# Patient Record
Sex: Female | Born: 1945 | Race: White | Hispanic: Yes | Marital: Married | Smoking: Never smoker
Health system: Southern US, Community
[De-identification: ages and names within clinical notes are randomized; demographics above are authoritative.]

## PROBLEM LIST (undated history)

## (undated) ENCOUNTER — Ambulatory Visit

## (undated) DIAGNOSIS — R0789 Other chest pain: Secondary | ICD-10-CM

## (undated) DIAGNOSIS — I8390 Asymptomatic varicose veins of unspecified lower extremity: Secondary | ICD-10-CM

## (undated) DIAGNOSIS — K7689 Other specified diseases of liver: Secondary | ICD-10-CM

## (undated) DIAGNOSIS — K449 Diaphragmatic hernia without obstruction or gangrene: Secondary | ICD-10-CM

## (undated) DIAGNOSIS — I493 Ventricular premature depolarization: Secondary | ICD-10-CM

## (undated) DIAGNOSIS — I340 Nonrheumatic mitral (valve) insufficiency: Secondary | ICD-10-CM

## (undated) DIAGNOSIS — J309 Allergic rhinitis, unspecified: Secondary | ICD-10-CM

## (undated) DIAGNOSIS — M1990 Unspecified osteoarthritis, unspecified site: Secondary | ICD-10-CM

## (undated) DIAGNOSIS — E785 Hyperlipidemia, unspecified: Secondary | ICD-10-CM

## (undated) DIAGNOSIS — K2970 Gastritis, unspecified, without bleeding: Secondary | ICD-10-CM

## (undated) DIAGNOSIS — I10 Essential (primary) hypertension: Secondary | ICD-10-CM

## (undated) DIAGNOSIS — K222 Esophageal obstruction: Secondary | ICD-10-CM

## (undated) DIAGNOSIS — K5790 Diverticulosis of intestine, part unspecified, without perforation or abscess without bleeding: Secondary | ICD-10-CM

## (undated) DIAGNOSIS — Z87442 Personal history of urinary calculi: Secondary | ICD-10-CM

## (undated) DIAGNOSIS — B029 Zoster without complications: Secondary | ICD-10-CM

## (undated) DIAGNOSIS — I499 Cardiac arrhythmia, unspecified: Secondary | ICD-10-CM

## (undated) DIAGNOSIS — K589 Irritable bowel syndrome without diarrhea: Secondary | ICD-10-CM

## (undated) DIAGNOSIS — M810 Age-related osteoporosis without current pathological fracture: Secondary | ICD-10-CM

## (undated) DIAGNOSIS — K219 Gastro-esophageal reflux disease without esophagitis: Secondary | ICD-10-CM

## (undated) HISTORY — DX: Other specified diseases of liver: K76.89

## (undated) HISTORY — DX: Gastro-esophageal reflux disease without esophagitis: K21.9

## (undated) HISTORY — DX: Unspecified osteoarthritis, unspecified site: M19.90

## (undated) HISTORY — PX: CATARACT EXTRACTION, BILATERAL: SHX1313

## (undated) HISTORY — DX: Hyperlipidemia, unspecified: E78.5

## (undated) HISTORY — DX: Diverticulosis of intestine, part unspecified, without perforation or abscess without bleeding: K57.90

## (undated) HISTORY — DX: Asymptomatic varicose veins of unspecified lower extremity: I83.90

## (undated) HISTORY — DX: Irritable bowel syndrome, unspecified: K58.9

## (undated) HISTORY — DX: Other chest pain: R07.89

## (undated) HISTORY — DX: Nonrheumatic mitral (valve) insufficiency: I34.0

## (undated) HISTORY — DX: Allergic rhinitis, unspecified: J30.9

## (undated) HISTORY — DX: Age-related osteoporosis without current pathological fracture: M81.0

## (undated) HISTORY — DX: Ventricular premature depolarization: I49.3

## (undated) HISTORY — DX: Esophageal obstruction: K22.2

## (undated) HISTORY — DX: Gastritis, unspecified, without bleeding: K29.70

## (undated) HISTORY — PX: CARDIAC CATHETERIZATION: SHX172

## (undated) HISTORY — PX: COLONOSCOPY: SHX174

## (undated) HISTORY — DX: Personal history of urinary calculi: Z87.442

## (undated) HISTORY — DX: Zoster without complications: B02.9

## (undated) HISTORY — PX: ESOPHAGOGASTRODUODENOSCOPY: SHX1529

## (undated) HISTORY — DX: Diaphragmatic hernia without obstruction or gangrene: K44.9

---
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
## 1990-11-24 HISTORY — PX: OTHER SURGICAL HISTORY: SHX169

## 2006-11-24 HISTORY — PX: CYSTOCELE REPAIR: SHX163

## 2007-11-08 DIAGNOSIS — K219 Gastro-esophageal reflux disease without esophagitis: Secondary | ICD-10-CM

## 2007-11-08 HISTORY — DX: Gastro-esophageal reflux disease without esophagitis: K21.9

## 2007-11-08 LAB — HM COLONOSCOPY

## 2008-10-02 ENCOUNTER — Encounter: Payer: Self-pay | Admitting: Internal Medicine

## 2009-06-19 ENCOUNTER — Encounter: Payer: Self-pay | Admitting: Internal Medicine

## 2009-07-04 ENCOUNTER — Encounter: Payer: Self-pay | Admitting: Internal Medicine

## 2009-07-05 ENCOUNTER — Encounter: Payer: Self-pay | Admitting: Internal Medicine

## 2009-10-04 ENCOUNTER — Encounter: Payer: Self-pay | Admitting: Internal Medicine

## 2010-06-14 ENCOUNTER — Encounter: Payer: Self-pay | Admitting: Internal Medicine

## 2010-06-14 LAB — CONVERTED CEMR LAB
Albumin: 4.1 g/dL
Alkaline Phosphatase: 58 units/L
BUN: 15 mg/dL
Chloride: 101 meq/L
Cholesterol: 195 mg/dL
Creatinine, Ser: 0.73 mg/dL
Eosinophils Relative: 3 %
HCT: 44 %
HDL: 46 mg/dL
Neutrophils Relative %: 60 %
Platelets: 246 10*3/uL
RBC: 4.46 M/uL
RDW: 13.8 %
Sodium: 141 meq/L
Total Bilirubin: 0.4 mg/dL
Total Protein: 6.6 g/dL

## 2010-09-27 ENCOUNTER — Ambulatory Visit: Payer: Self-pay | Admitting: Internal Medicine

## 2010-09-27 ENCOUNTER — Telehealth: Payer: Self-pay | Admitting: Internal Medicine

## 2010-09-27 ENCOUNTER — Encounter: Payer: Self-pay | Admitting: Gastroenterology

## 2010-09-27 DIAGNOSIS — R519 Headache, unspecified: Secondary | ICD-10-CM | POA: Insufficient documentation

## 2010-09-27 DIAGNOSIS — I10 Essential (primary) hypertension: Secondary | ICD-10-CM | POA: Insufficient documentation

## 2010-09-27 DIAGNOSIS — G43909 Migraine, unspecified, not intractable, without status migrainosus: Secondary | ICD-10-CM | POA: Insufficient documentation

## 2010-09-27 DIAGNOSIS — Z87442 Personal history of urinary calculi: Secondary | ICD-10-CM | POA: Insufficient documentation

## 2010-09-27 DIAGNOSIS — R51 Headache: Secondary | ICD-10-CM | POA: Insufficient documentation

## 2010-09-27 DIAGNOSIS — M1990 Unspecified osteoarthritis, unspecified site: Secondary | ICD-10-CM | POA: Insufficient documentation

## 2010-09-27 DIAGNOSIS — K219 Gastro-esophageal reflux disease without esophagitis: Secondary | ICD-10-CM | POA: Insufficient documentation

## 2010-09-27 DIAGNOSIS — M810 Age-related osteoporosis without current pathological fracture: Secondary | ICD-10-CM

## 2010-09-27 DIAGNOSIS — M816 Localized osteoporosis [Lequesne]: Secondary | ICD-10-CM | POA: Insufficient documentation

## 2010-09-27 HISTORY — DX: Migraine, unspecified, not intractable, without status migrainosus: G43.909

## 2010-09-27 HISTORY — DX: Personal history of urinary calculi: Z87.442

## 2010-09-27 HISTORY — DX: Age-related osteoporosis without current pathological fracture: M81.0

## 2010-09-27 HISTORY — DX: Essential (primary) hypertension: I10

## 2010-11-01 ENCOUNTER — Ambulatory Visit: Payer: Self-pay | Admitting: Gastroenterology

## 2010-11-01 ENCOUNTER — Encounter (INDEPENDENT_AMBULATORY_CARE_PROVIDER_SITE_OTHER): Payer: Self-pay | Admitting: *Deleted

## 2010-11-01 DIAGNOSIS — R1032 Left lower quadrant pain: Secondary | ICD-10-CM | POA: Insufficient documentation

## 2010-11-01 DIAGNOSIS — R1319 Other dysphagia: Secondary | ICD-10-CM | POA: Insufficient documentation

## 2010-11-01 DIAGNOSIS — R1031 Right lower quadrant pain: Secondary | ICD-10-CM | POA: Insufficient documentation

## 2010-11-01 LAB — CONVERTED CEMR LAB
ALT: 21 units/L (ref 0–35)
BUN: 16 mg/dL (ref 6–23)
Basophils Relative: 0.5 % (ref 0.0–3.0)
Calcium: 9.5 mg/dL (ref 8.4–10.5)
Creatinine, Ser: 0.7 mg/dL (ref 0.4–1.2)
Eosinophils Absolute: 0.2 10*3/uL (ref 0.0–0.7)
Ferritin: 52.8 ng/mL (ref 10.0–291.0)
Folate: >20 ng/mL
GFR calc non Af Amer: 86.29 mL/min (ref >60.00)
Hemoglobin: 14.4 g/dL (ref 12.0–15.0)
Iron: 90 ug/dL (ref 42–145)
Lymphs Abs: 2.1 10*3/uL (ref 0.7–4.0)
MCHC: 35.1 g/dL (ref 30.0–36.0)
MCV: 95.9 fL (ref 78.0–100.0)
Monocytes Absolute: 0.6 10*3/uL (ref 0.1–1.0)
Neutro Abs: 4.4 10*3/uL (ref 1.4–7.7)
RBC: 4.27 M/uL (ref 3.87–5.11)
RDW: 13.5 % (ref 11.5–14.6)
TSH: 0.82 microintl units/mL (ref 0.35–5.50)
Total Bilirubin: 0.5 mg/dL (ref 0.3–1.2)
Vitamin B-12: 1371 pg/mL — ABNORMAL HIGH (ref 211–911)

## 2010-11-11 ENCOUNTER — Ambulatory Visit: Payer: Self-pay | Admitting: Gastroenterology

## 2010-11-11 DIAGNOSIS — K222 Esophageal obstruction: Secondary | ICD-10-CM

## 2010-11-11 HISTORY — DX: Esophageal obstruction: K22.2

## 2010-11-11 LAB — CONVERTED CEMR LAB: UREASE: NEGATIVE

## 2010-11-29 ENCOUNTER — Telehealth: Payer: Self-pay | Admitting: Gastroenterology

## 2010-12-24 NOTE — Letter (Signed)
Summary: EGD Instructions  [facility]  [address] [place], [place] [address]   Phone: [phone]  Fax: [phone]       [name] [name]    06/30/1946    MRN: [id number]       Procedure Day /Date: Monday 11/11/2010     Arrival Time: 8am     Procedure Time: 9am     Location of Procedure:                    [facility] (4th Floor)    PREPARATION FOR ENDOSCOPY   On 11/11/2010 THE DAY OF THE PROCEDURE:  1.   No solid foods, milk or milk products are allowed after midnight the night before your procedure.  2.   Do not drink anything colored red or purple.  Avoid juices with pulp.  No orange juice.  3.  You may drink clear liquids until 7:00am, which is 2 hours before your procedure.                                                                                                CLEAR LIQUIDS INCLUDE: Water Jello Ice Popsicles Tea (sugar ok, no milk/cream) Powdered fruit flavored drinks Coffee (sugar ok, no milk/cream) Gatorade Juice: apple, white grape, white cranberry  Lemonade Clear bullion, consomm, broth Carbonated beverages (any kind) Strained chicken noodle soup Hard Candy   MEDICATION INSTRUCTIONS  Unless otherwise instructed, you should take regular prescription medications with a small sip of water as early as possible the morning of your procedure.                OTHER INSTRUCTIONS  You will need a responsible adult at least 66 years of age to accompany you and drive you home.   This person must remain in the waiting room during your procedure.  Wear loose fitting clothing that is easily removed.  Leave jewelry and other valuables at home.  However, you may wish to bring a book to read or an iPod/MP3 player to listen to music as you wait for your procedure to start.  Remove all body piercing jewelry and leave at home.  Total time from sign-in until discharge is approximately 2-3 hours.  You should go home  directly after your procedure and rest.  You can resume normal activities the day after your procedure.  The day of your procedure you should not:   Drive   Make legal decisions   Operate machinery   Drink alcohol   Return to work  You will receive specific instructions about eating, activities and medications before you leave.    The above instructions have been reviewed and explained to me by   _______________________    I fully understand and can verbalize these instructions _____________________________ Date _________

## 2010-12-24 NOTE — Progress Notes (Signed)
Summary: RX REQUEST  Phone Note Call from Patient Call back at Home Phone [phone]   Caller: Patient Call For: Newt Lukes MD Summary of Call: Pt is requesting a rx for METOPROLOL TARTRATE 25MG . Please advise.  Initial call taken by: Livingston Diones,  September 27, 2010 10:36 AM  Follow-up for Phone Call        [facility] [address] Pt is aware Follow-up by: Orlan Leavens RMA,  September 27, 2010 10:45 AM    Prescriptions: METOPROLOL TARTRATE 25 MG TABS (METOPROLOL TARTRATE) take 1 two times a day  #60 x 3   Entered by:   Orlan Leavens RMA   Authorized by:   Newt Lukes MD   Signed by:   Orlan Leavens RMA on 09/27/2010   Method used:   Electronically to        [id number]       [address]       [place], [place]  [address]       Ph: [phone] or [phone]       Fax: [phone]   RxID:   [id number]

## 2010-12-24 NOTE — Assessment & Plan Note (Signed)
Summary: New / Medicare / # cd   Vital Signs:  Patient profile:   65 year old female Height:      62 inches (157.48 cm) Weight:      147.12 pounds (66.87 kg) BMI:     27.01 O2 Sat:      96 % on Room air Temp:     98.6 degrees F (37.00 degrees C) oral Pulse rate:   76 / minute BP sitting:   152 / 82  (left arm) Cuff size:   regular  Vitals Entered By: Orlan Leavens RMA (September 27, 2010 9:48 AM)  O2 Flow:  Room air CC: New patient Is Patient Diabetic? No Pain Assessment Patient in pain? no        Primary Care Provider:  Newt Lukes MD  CC:  New patient.  History of Present Illness: new pt to me and our practice, here to est care has brought records from prior PCP in [place] to review  c/o epigastric pain present for 3+ years but worse in past 4 weeks feels like prior GERD symptoms  prior EGD for same 2008 (per pt, no report available) - rx'd nexium, no ulcers -- improved symptoms  now on omeprazol due to cost x 6 months reports compliance with ongoing medical treatment and no changes in medication dose or frequency. denies adverse side effects related to current therapy.  saw urg care 2 weeks ago for same pain - referred for cardiac eval - echo 11/8  and nuc stress sched occ diarrhea, no vomitting, no melena or BRBPR - no weight loss  osteopenia hx - prev rx'd bisphos (aldentronate weekly) since 2004 (>7y) pt stopped mid 08/2010 due to inc stomach symptoms  - no hx fx, no bone pain  HTN - reports partial compliance with ongoing medical treatment and no changes in medication dose or frequency. denies adverse side effects related to current therapy.   OA - affects knees, hands, back.  reports compliance with ongoing medical treatment and no changes in medication dose or frequency. denies adverse side effects related to current therapy. no flares or current swelling - no falls  Preventive Screening-Counseling & Management  Alcohol-Tobacco     Alcohol drinks/day:  0     Alcohol Counseling: not indicated; patient does not drink     Smoking Status: never     Tobacco Counseling: not indicated; no tobacco use  Caffeine-Diet-Exercise     Does Patient Exercise: no     Exercise Counseling: to improve exercise regimen     Depression Counseling: not indicated; screening negative for depression  Safety-Violence-Falls     Seat Belt Counseling: not indicated; patient wears seat belts     Helmet Counseling: not applicable     Firearm Counseling: not applicable     Violence Counseling: not indicated; no violence risk noted     Fall Risk Counseling: not indicated; no significant falls noted  Clinical Review Panels:  Prevention   Last Mammogram:  Done @ [facility] in [place] No specific mammographic evidence of malignancy.  Assessment: BIRADS 1. (10/02/2008)  Immunizations   Last Flu Vaccine:  Fluvax 3+ (09/27/2010)  Lipid Management   Cholesterol:  195 (06/14/2010)   LDL (bad choesterol):  114 (06/14/2010)   HDL (good cholesterol):  46 (06/14/2010)   Triglycerides:  177 (06/14/2010)  CBC   WBC:  6.7 (06/14/2010)   RBC:  4.46 (06/14/2010)   Hgb:  14.3 (06/14/2010)   Hct:  44.0 (06/14/2010)  Platelets:  246 (06/14/2010)   MCV  99 (06/14/2010)   RDW  13.8 (06/14/2010)   PMN:  60 (06/14/2010)   Monos:  9 (06/14/2010)   Eosinophils:  3 (06/14/2010)   Basophil:  0 (06/14/2010)  Complete Metabolic Panel   Glucose:  96 (06/14/2010)   Sodium:  141 (06/14/2010)   Potassium:  4.0 (06/14/2010)   Chloride:  101 (06/14/2010)   CO2:  29 (06/14/2010)   BUN:  15 (06/14/2010)   Creatinine:  0.73 (06/14/2010)   Albumin:  4.1 (06/14/2010)   Total Protein:  6.6 (06/14/2010)   Calcium:  9.4 (06/14/2010)   Total Bili:  0.4 (06/14/2010)   Alk Phos:  58 (06/14/2010)   SGPT (ALT):  14 (06/14/2010)   SGOT (AST):  16 (06/14/2010)   -  Date:  06/14/2010    WBC: 6.7    HGB: 14.3    HCT: 44.0    RBC: 4.46    PLT: 246    MCV: 99    RDW:  13.8    Neutrophil: 60    Lymphs: 28    Monos: 9    Eos: 3    Basophil: 0    BG Random: 96    BUN: 15    Creatinine: 0.73    Sodium: 141    Potassium: 4.0    Chloride: 101    CO2 Total: 29    SGOT (AST): 16    SGPT (ALT): 14    T. Bilirubin: 0.4    Alk Phos: 58    Calcium: 9.4    Total Protein: 6.6    Albumin: 4.1    Cholesterol: 195    LDL: 114    HDL: 46    Triglycerides: 540    TSH: 0.811  Current Medications (verified): 1)  Omeprazole 20 Mg Cpdr (Omeprazole) .... Take 1 By Mouth Once Daily 2)  Metoprolol Tartrate 25 Mg Tabs (Metoprolol Tartrate) .... Take 1 Two Times A Day 3)  Multivitamins  Tabs (Multiple Vitamin) .... Take 1 By Mouth Once Daily 4)  Osteo Bi-Flex Joint Shield  Tabs (Misc Natural Products) .... Take 1 By Mouth Once Daily 5)  Vitamin B-12 1000 Mcg Tabs (Cyanocobalamin) .... Take 1 By Mouth Once Daily 6)  Citracal Plus  Tabs (Multiple Minerals-Vitamins) .... Take 1 By Mouth Once Daily 7)  Aspirin 81 Mg Tabs (Aspirin) .... Take 1 Three Times A Week  Allergies (verified): No Known Drug Allergies  Past History:  Past Medical History: Osteoarthritis Osteopenia GERD Headache hx/migraines Hypertension  Past Surgical History: Appendectomy (9811) Tonsillectomy (1960) Maxilofacial (1992) Cystocele repair (2008)  Family History: Family History Hypertension (parent) Stroke (parent)  Social History: Never Smoked no alcohol married, lives with spouse retired Comptroller, Manufacturing systems engineer has 3 kids (moved to [place] to be closer) - youngest son MD moved to [place] from [place] 06/2010, lived in [place] x 10ySmoking Status:  never Does Patient Exercise:  no  Review of Systems       see HPI above. I have reviewed all other systems and they were negative.   Physical Exam  General:  alert, well-developed, well-nourished, and cooperative to examination.    Eyes:  vision grossly intact; pupils equal, round and reactive to light.  conjunctiva and lids normal.     Ears:  normal pinnae bilaterally, without erythema, swelling, or tenderness to palpation. TMs clear, without effusion, or cerumen impaction. Hearing grossly normal bilaterally  Mouth:  teeth and gums in good repair; mucous membranes moist, without lesions  or ulcers. oropharynx clear without exudate, no erythema.  Lungs:  normal respiratory effort, no intercostal retractions or use of accessory muscles; normal breath sounds bilaterally - no crackles and no wheezes.    Heart:  normal rate, regular rhythm, no murmur, and no rub. BLE without edema. normal DP pulses and normal cap refill in all 4 extremities    Abdomen:  soft, non-tender, normal bowel sounds, no distention; no masses and no appreciable hepatomegaly or splenomegaly.   Genitalia:  defer gyn Msk:  No deformity or scoliosis noted of thoracic or lumbar spine.   Neurologic:  alert & oriented X3 and cranial nerves II-XII symetrically intact.  strength normal in all extremities, sensation intact to light touch, and gait normal. speech fluent without dysarthria or aphasia; follows commands with good comprehension.  Skin:  no rashes, vesicles, ulcers, or erythema. No nodules or irregularity to palpation.  Psych:  Oriented X3, memory intact for recent and remote, normally interactive, good eye contact, not anxious appearing, not depressed appearing, and not agitated.      Impression & Recommendations:  Problem # 1:  GERD (ICD-530.81)  change omeprazole to nexium as had improved relief - samples + erx done remain off bisphos (stopped indep 3 weeks ago) also send for prior GI records (EGD and colo) refer to local GI for eval of sam eif symptoms not responding to tx given hx prolonged tx with bisphos Her updated medication list for this problem includes:    Nexium 40 Mg Cpdr (Esomeprazole magnesium) .Marland Kitchen... 1 by mouth once daily  Orders: Gastroenterology Referral (GI) Prescription Created Electronically [phone])  Labs Reviewed: Hgb: 14.3  (06/14/2010)   Hct: 44.0 (06/14/2010)  Problem # 2:  HYPERTENSION (ICD-401.9) has been taking med only once daily - encouraged two times a day as rx'd given elev BP Her updated medication list for this problem includes:    Metoprolol Tartrate 25 Mg Tabs (Metoprolol tartrate) .Marland Kitchen... Take 1 two times a day  BP today: 152/82  Labs Reviewed: K+: 4.0 (06/14/2010) Creat: : 0.73 (06/14/2010)   Chol: 195 (06/14/2010)   HDL: 46 (06/14/2010)   LDL: 114 (06/14/2010)   TG: 177 (06/14/2010)  Problem # 3:  OSTEOPENIA (ICD-733.90)  last DEXA reviewed -  agree with stopping bisphos given inc GI symptoms - also as on tx >52yr, will not plan to resume at this time - cont Ca+D supplements - pt understands and agrees to same  Problem # 4:  OSTEOARTHRITIS (ICD-715.90)  Her updated medication list for this problem includes:    Aspirin 81 Mg Tabs (Aspirin) .Marland Kitchen... Take 1 three times a week  Discussed use of medications, application of heat or cold, and exercises.   Time spent with patient 45 minutes, more than 50% of this time was spent counseling patient on GERD, med tx of same and opther chronic medical problems including the concerning the need for GI eval >cardaic eval in my opinion - pt will consider cancel nuc stress test as done 06/2009 and normal (same symptoms then as current symptoms - dx and effective tx for GERD w/ nexium)  Complete Medication List: 1)  Metoprolol Tartrate 25 Mg Tabs (Metoprolol tartrate) .... Take 1 two times a day 2)  Multivitamins Tabs (Multiple vitamin) .... Take 1 by mouth once daily 3)  Osteo Bi-flex Joint Shield Tabs (Misc natural products) .... Take 1 by mouth once daily 4)  Vitamin B-12 1000 Mcg Tabs (Cyanocobalamin) .... Take 1 by mouth once daily 5)  Citracal Plus Tabs (  Multiple minerals-vitamins) .... Take 1 by mouth once daily 6)  Aspirin 81 Mg Tabs (Aspirin) .... Take 1 three times a week 7)  Nexium 40 Mg Cpdr (Esomeprazole magnesium) .Marland Kitchen.. 1 by mouth once  daily  Other Orders: Flu Vaccine 41yrs + MEDICARE PATIENTS (O9629) Administration Flu vaccine - MCR (B2841)  Patient Instructions: 1)  it was good to see you today. 2)  stop aldendronate as you have done 3)  change omeprazole to Nexium - samples given and your prescriptio has been electronically submitted to your pharmacy. Please take as directed. Contact our office if you believe you're having problems with the medication(s).  4)  take the metoprolol two times a day as prescribed 5)  we'll make referral to [facility]. Our office will contact you regarding this appointment once made.  6)  will send for records about your prior endoscopy and colonoscopy as discussed 7)  Please schedule a follow-up appointment in 3 months to review blood pressure and stomach symptoms, call sooner if problems.  Prescriptions: NEXIUM 40 MG CPDR (ESOMEPRAZOLE MAGNESIUM) 1 by mouth once daily  #30 x 3   Entered and Authorized by:   Newt Lukes MD   Signed by:   Newt Lukes MD on 09/27/2010   Method used:   [facility]ron[facility] to        [id number]       [address]       [place], [place]  [address]       Ph: [phone] or [phone]       Fax: [phone]   RxID:   [id number]    Orders Added: 1)  Gastroenterology Referral [GI] 2)  Flu Vaccine 29yrs + MEDICARE PATIENTS [Q2039] 3)  Administration Flu vaccine - MCR [G0008] 4)  New Patient Level IV [99204] 5)  Prescription Created Electronically [phone] Flu Vaccine Consent Questions     Do you have a history of severe allergic reactions to this vaccine? no    Any prior history of allergic reactions to egg and/or gelatin? no    Do you have a sensitivity to the preservative Thimersol? no    Do you have a past history of Guillan-Barre Syndrome? no    Do you currently have an acute febrile illness? no    Have you ever had a severe reaction to latex? no    Vaccine information given and explained to patient? yes    Are you  currently pregnant? no    Lot Number:[id number]   Exp Date:05/24/2011   Site Given  Left Deltoid IMcine 30yrs + MEDICARE PATIENTS [Q2039] 3)  Administration Flu vaccine - MCR [G0008]     .lbmedflu   Bone Density  Procedure date:  10/04/2009  Findings:      Done @ [facility] in [facility] AP Spine, BMD 0.906/ T-score -2.3 Dual Femur, Neck left, BMD 0.712/ T-score -2.2 Dual femur, Neck right, BMD0.711/ T-score -2.2 Total left, BMD 0.825/ T-score -1.5 Total right, BMD 0.819/ T-score -1.5  Comments:      Assessment:  Osteopenia.     MRI Brain  Procedure date:  07/04/2009  Findings:      Exam Type: MRI of the Brain without contrast Done in [facility] Impression; 1. Left frontal meniogioma 2. Mild nonspecific white matter changes 3. No evidence of acute territorial infaction is identified   MISC. Report  Procedure date:  07/06/2009  Findings:      Type of Report: HR  NM Myocardial perfusion, done in [facility] Findings: The clinical response was nonischemic without development of chest pain. The ECG respone was nonischemic without development of any ST depression.  Gated SPECT images demonstrates a normal wall motion and normal LV size LVEF 65%  Perfusion was normal   Mammogram  Procedure date:  10/02/2008  Findings:      Done @ [facility] in [place] No specific mammographic evidence of malignancy.  Assessment: BIRADS 1.

## 2010-12-24 NOTE — Letter (Signed)
Summary: New Patient letter  [facility]  [address] [place], [place] [address]   Phone: [phone]  Fax: [phone]       09/27/2010 MRN: [id number]  [facility] [address] [place], [place]  [address]  Dear [name],  Welcome to the Gastroenterology Division at [facility].    You are scheduled to see Dr.  Jarold Motto on 11-01-10 at 9:30am on the 3rd floor at [facility], [address]  We ask that you try to arrive at our office 15 minutes prior to your appointment time to allow for check-in.  We would like you to complete the enclosed self-administered evaluation form prior to your visit and bring it with you on the day of your appointment.  We will review it with you.  Also, please bring a complete list of all your medications or, if you prefer, bring the medication bottles and we will list them.  Please bring your insurance card so that we may make a copy of it.  If your insurance requires a referral to see a specialist, please bring your referral form from your primary care physician.  Co-payments are due at the time of your visit and may be paid by cash, check or credit card.     Your office visit will consist of a consult with your physician (includes a physical exam), any laboratory testing he/she may order, scheduling of any necessary diagnostic testing (e.g. x-ray, ultrasound, CT-scan), and scheduling of a procedure (e.g. Endoscopy, Colonoscopy) if required.  Please allow enough time on your schedule to allow for any/all of these possibilities.    If you cannot keep your appointment, please call [phone] to cancel or reschedule prior to your appointment date.  This allows [place] the opportunity to schedule an appointment for another patient in need of care.  If you do not cancel or reschedule by 5 p.m. the business day prior to your appointment date, you will be charged a $50.00 late cancellation/no-show fee.    Thank  you for choosing [facility] for your medical needs.  We appreciate the opportunity to care for you.  Please visit [place] at our website  to learn more about our practice.                     Sincerely,                                                             The Gastroenterology Division

## 2010-12-25 ENCOUNTER — Encounter: Payer: Self-pay | Admitting: Internal Medicine

## 2010-12-25 ENCOUNTER — Ambulatory Visit (INDEPENDENT_AMBULATORY_CARE_PROVIDER_SITE_OTHER): Payer: Medicare Other | Admitting: Internal Medicine

## 2010-12-25 DIAGNOSIS — K219 Gastro-esophageal reflux disease without esophagitis: Secondary | ICD-10-CM

## 2010-12-25 DIAGNOSIS — R1031 Right lower quadrant pain: Secondary | ICD-10-CM

## 2010-12-25 DIAGNOSIS — R197 Diarrhea, unspecified: Secondary | ICD-10-CM | POA: Insufficient documentation

## 2010-12-25 DIAGNOSIS — R1032 Left lower quadrant pain: Secondary | ICD-10-CM

## 2010-12-26 DIAGNOSIS — R1013 Epigastric pain: Secondary | ICD-10-CM | POA: Insufficient documentation

## 2010-12-26 HISTORY — DX: Epigastric pain: R10.13

## 2010-12-26 NOTE — Progress Notes (Signed)
Summary: Biopsy results  Phone Note Call from Patient Call back at Home Phone [phone]   Caller: spouse Quitman Livings Call For: Dr. Jarold Motto Reason for Call: Lab or Test Results Summary of Call: [place] about EGD biopsy results Initial call taken by: Karna Christmas,  November 29, 2010 3:20 PM  Follow-up for Phone Call        advised bx neg., left message on patients voicemail. Follow-up by: Harlow Mares CMA Duncan Dull),  November 29, 2010 3:34 PM

## 2010-12-26 NOTE — Assessment & Plan Note (Signed)
Summary: GERD & EPIGASTRIC PAIN/YF   History of Present Illness Visit Type: Initial Consult Primary GI MD: Sheryn Bison MD FACP FAGA Primary Provider: Newt Lukes MD Requesting Provider: Newt Lukes MD Chief Complaint: Epigastric pain with acid reflux x several years and recentyl started on Nexium with some improvement in sx. Pt states she can feel the food going down and it takes a lot longer than it should. History of Present Illness:   Very Pleasant 65 year old Caucasian female with a long history of acid reflux with previous endoscopy and colonoscopy apparently normal in [place] in 2008. These records are not available for review.  She currently describes progressive solid food dysphagia in her subxiphoid area with chronic dyspepsia and reflux symptoms slightly better with Nexium which she has used over the last month. She does have nocturnal awakening with acid reflux. She's had no anorexia, weight loss, or significant lower gastrointestinal symptoms except for periodic left lower quadrant intestinal spasms without associated diarrhea. She denies any specific food intolerances or significant other medical problems. She has had problems with B12 and calcium deficiency.   GI Review of Systems    Reports abdominal pain, acid reflux, and  chest pain.     Location of  Abdominal pain: epigastric area.    Denies belching, bloating, dysphagia with liquids, dysphagia with solids, heartburn, loss of appetite, nausea, vomiting, vomiting blood, weight loss, and  weight gain.        Denies anal fissure, black tarry stools, change in bowel habit, constipation, diarrhea, diverticulosis, fecal incontinence, heme positive stool, hemorrhoids, irritable bowel syndrome, jaundice, light color stool, liver problems, rectal bleeding, and  rectal pain.    Current Medications (verified): 1)  Metoprolol Tartrate 25 Mg Tabs (Metoprolol Tartrate) .... Take 1 Two Times A Day 2)   Multivitamins  Tabs (Multiple Vitamin) .... Take 1 By Mouth Once Daily 3)  Osteo Bi-Flex Joint Shield  Tabs (Misc Natural Products) .... Take 1 By Mouth Once Daily 4)  Vitamin B-12 1000 Mcg Tabs (Cyanocobalamin) .... Take 1 By Mouth Once Daily 5)  Citracal Plus  Tabs (Multiple Minerals-Vitamins) .... Take 1 By Mouth Once Daily 6)  Aspirin 81 Mg Tabs (Aspirin) .... Take 1 Three Times A Week 7)  Nexium 40 Mg Cpdr (Esomeprazole Magnesium) .Marland Kitchen.. 1 By Mouth Once Daily  Allergies (verified): No Known Drug Allergies  Past History:  Past medical, surgical, family and social histories (including risk factors) reviewed for relevance to current acute and chronic problems.  Past Medical History: Reviewed history from 09/27/2010 and no changes required. Osteoarthritis Osteopenia GERD Headache hx/migraines Hypertension  Past Surgical History: Reviewed history from 09/27/2010 and no changes required. Appendectomy (0454) Tonsillectomy (1960) Maxilofacial (1992) Cystocele repair (2008)  Family History: Reviewed history from 09/27/2010 and no changes required. Family History Hypertension (parent) Stroke (parent)  Social History: Reviewed history from 09/27/2010 and no changes required. Never Smoked no alcohol married, lives with spouse retired Comptroller, Manufacturing systems engineer has 3 kids (moved to [place] to be closer) - youngest son MD moved to [place] from [place] 06/2010, lived in [place] x 10y  Review of Systems       The patient complains of headaches-new.  The patient denies allergy/sinus, anemia, anxiety-new, arthritis/joint pain, back pain, blood in urine, breast changes/lumps, change in vision, confusion, cough, coughing up blood, depression-new, fainting, fatigue, fever, hearing problems, heart murmur, heart rhythm changes, itching, menstrual pain, muscle pains/cramps, night sweats, nosebleeds, pregnancy symptoms, shortness of breath, skin rash, sleeping problems, sore  throat, swelling of  feet/legs, swollen lymph glands, thirst - excessive , urination - excessive , urination changes/pain, urine leakage, vision changes, and voice change.    Vital Signs:  Patient profile:   65 year old female Height:      62 inches Weight:      148.50 pounds Pulse rate:   80 / minute Pulse rhythm:   regular BP sitting:   138 / 72  (left arm) Cuff size:   regular  Vitals Entered By: Christie Nottingham CMA Duncan Dull) (November 01, 2010 9:31 AM)  Physical Exam  General:  Well developed, well nourished, no acute distress.healthy appearing.   Head:  Normocephalic and atraumatic. Eyes:  PERRLA, no icterus.exam deferred to patient's ophthalmologist.   Neck:  Supple; no masses or thyromegaly. Lungs:  Clear throughout to auscultation. Heart:  Regular rate and rhythm; no murmurs, rubs,  or bruits. Abdomen:  Soft, nontender and nondistended. No masses, hepatosplenomegaly or hernias noted. Normal bowel sounds. Rectal:  Normal exam.hemocult negative.   Msk:  Symmetrical with no gross deformities. Normal posture. Extremities:  No clubbing, cyanosis, edema or deformities noted. Neurologic:  Alert and  oriented x4;  grossly normal neurologically. Cervical Nodes:  No significant cervical adenopathy. Psych:  Alert and cooperative. Normal mood and affect.   Impression & Recommendations:  Problem # 1:  GERD (ICD-530.81) Assessment Deteriorated Continue reflux maneuvers and daily Nexium therapy. Endoscopy and probable esophageal dilatation has been scheduled at her convenience. She is to continue a soft diet as tolerated. Screening labs have been ordered.  TLB-CBC Platelet - w/Differential (85025-CBCD) TLB-BMP (Basic Metabolic Panel-BMET) (80048-METABOL) TLB-Hepatic/Liver Function Pnl (80076-HEPATIC) TLB-TSH (Thyroid Stimulating Hormone) (84443-TSH) TLB-B12, Serum-Total ONLY (96295-M84) TLB-Ferritin (82728-FER) TLB-Folic Acid (Folate) (82746-FOL) TLB-IBC Pnl (Iron/FE;Transferrin)  (83550-IBC) TLB-Sedimentation Rate (ESR) (85652-ESR) EGD (EGD)  Problem # 2:  ABDOMINAL PAIN-LLQ (ICD-789.04) Assessment: Unchanged Consistent with IBS and intermittent colonic spasms. We will try to obtain her colonoscopy report from her previous physicians. I suspect she has underlying diverticulosis coli.  Problem # 3:  OSTEOPENIA (ICD-733.90) Assessment: Unchanged Consider possible underlying celiac disease. Antibodies have been ordered and we will obtain small bowel biopsy at the time of endoscopy.  Patient Instructions: 1)  Copy sent to : Newt Lukes MD 2)  Please go to the basement today for your labs.  3)  Your prescription(s) have been sent to you pharmacy.  4)  Your procedure has been scheduled for 11/11/2010, please follow the seperate instructions.  5)  [facility] Patient Information Guide given to patient.  6)  Upper Endoscopy brochure given.  7)  Avoid foods high in acid content ( tomatoes, citrus juices, spicy foods) . Avoid eating within 3 to 4 hours of lying down or before exercising. Do not over eat; try smaller more frequent meals. Elevate head of bed four inches when sleeping.  8)  GI Reflux brochure given.  9)  The medication list was reviewed and reconciled.  All changed / newly prescribed medications were explained.  A complete medication list was provided to the patient / caregiver. Prescriptions: LEVSIN 0.125 MG TABS (HYOSCYAMINE SULFATE) take one by mouth two times a day as needed  #60 x 6   Entered by:   Harlow Mares CMA (AAMA)   Authorized by:   Mardella Layman MD [facility]   Signed by:   Harlow Mares CMA (AAMA) on 11/01/2010   Method used:   Electronically to        [facility] (retail)  [address]       [place], [place]  [address]       Ph: [phone] or [phone]       Fax: [phone]   RxID:   [id number]

## 2010-12-26 NOTE — Procedures (Signed)
Summary: Upper Endoscopy  Patient: [name] Note: All result statuses are Final unless otherwise noted.  Tests: (1) Upper Endoscopy (EGD)   EGD Upper Endoscopy       DONE     [facility]     [address]     [place], [place]  [address]           ENDOSCOPY PROCEDURE REPORT           PATIENT:  [name] [name], [name] [name]  MR#:  [id number]     BIRTHDATE:  [birth year], 64 yrs. old  GENDER:  female           ENDOSCOPIST:  Vania Rea. Jarold Motto, MD, [facility]     Referred by:  Rene Paci, M.D.           PROCEDURE DATE:  11/11/2010     PROCEDURE:  EGD with biopsy, 43239, Maloney Dilation of Esophagus     ASA CLASS:  Class II     INDICATIONS:  dysphagia           MEDICATIONS:   Fentanyl 50 mcg IV, Versed 7 mg IV     TOPICAL ANESTHETIC:  Exactacain Spray           DESCRIPTION OF PROCEDURE:   After the risks benefits and     alternatives of the procedure were thoroughly explained, informed     consent was obtained.  The LB GIF-H180 [id number] endoscope was     introduced through the mouth and advanced to the second portion of     the duodenum, without limitations.  The instrument was slowly     withdrawn as the mucosa was fully examined.     <<PROCEDUREIMAGES>>           A Schatzki's ring was found in the distal esophagus. SEE     PICTURES.DILATED #60F MALONEY DILATOR.SL HEME.SOME RESISTANCE.     The upper, middle, and distal third of the esophagus were carefully     inspected and no abnormalities were noted. The z-line was well     seen at the GEJ. The endoscope was pushed into the fundus which     was normal including a retroflexed view. The antrum,gastric body,     first and second part of the duodenum were unremarkable. CLO BX.     DONE.    Retroflexed views revealed a hiatal hernia.  1-2 CM HH     NOTED.  The scope was then withdrawn from the patient and the     procedure completed.           COMPLICATIONS:  None           ENDOSCOPIC IMPRESSION:     1) Schatzki's ring in  the distal esophagus     2) Normal EGD     3) A hiatal hernia     RECOMMENDATIONS:     1) Clear liquids until, then soft foods rest iof day. Resume     prior diet tomorrow.     2) continue PPI     3) dilatations PRN           REPEAT EXAM:  No           ______________________________     Vania Rea. Jarold Motto, MD, [name]           CC:           n.     eSIGNED:   Vania Rea. Patterson at 11/11/2010 09:46 AM  Para, Cossey, [phone]  Note: An exclamation mark (!) indicates a result that was not dispersed into the flowsheet. Document Creation Date: 11/11/2010 9:46 AM _______________________________________________________________________  (1) Order result status: Final Collection or observation date-time: 11/11/2010 09:40 Requested date-time:  Receipt date-time:  Reported date-time:  Referring Physician:   Ordering Physician: Sheryn Bison [phone]) Specimen Source:  Source: Launa Grill Order Number: [id number] Lab site:

## 2010-12-26 NOTE — Miscellaneous (Signed)
Summary: Clotest  Clinical Lists Changes  Orders: Added new Test order of TLB-H Pylori Screen Gastric Biopsy (83013-CLOTEST) - Signed 

## 2010-12-27 ENCOUNTER — Ambulatory Visit: Payer: Self-pay | Admitting: Internal Medicine

## 2010-12-31 ENCOUNTER — Other Ambulatory Visit: Payer: Self-pay | Admitting: Internal Medicine

## 2011-01-01 NOTE — Assessment & Plan Note (Signed)
Summary: Abdominal pain   Vital Signs:  Patient profile:   65 year old female Height:      62 inches (157.48 cm) Weight:      144.0 pounds (65.45 kg) O2 Sat:      97 % on Room air Temp:     98.5 degrees F (36.94 degrees C) oral Pulse rate:   68 / minute BP sitting:   132 / 92  (left arm) Cuff size:   large  Vitals Entered By: Orlan Leavens RMA (December 25, 2010 11:24 AM)  O2 Flow:  Room air CC: Abdominal pain x's 2 weeks Is Patient Diabetic? No Pain Assessment Patient in pain? yes     Location: abdomen Type: aching   Primary Care Provider:  Newt Lukes MD  CC:  Abdominal pain x's 2 weeks.  History of Present Illness:  c/o continued epigastric pain present for 3+ years but worse in past 2 weeks feels like prior GERD symptoms  prior EGD for same 2008 and again EGD 10/2010: no ulcers, s/p esoph dialtion of distal ring  not improved symptoms on omeprazol or nexium  reports compliance with ongoing medical treatment and no changes in medication dose or frequency. denies adverse side effects related to current therapy.  now diarrhea at night, no blood or fever no vomitting, no melena, no weight loss s/p cardiac eval 06/2009 for same pain- echo and nuc stress neg  also reviewed chronic med issues:  osteopenia hx - prev rx'd bisphos (aldentronate weekly) since 2004 (>7y) pt stopped mid 08/2010 due to inc stomach symptoms  - no hx fx, no bone pain  HTN - reports partial compliance with ongoing medical treatment and no changes in medication dose or frequency. denies adverse side effects related to current therapy.   OA - affects knees, hands, back.  reports compliance with ongoing medical treatment and no changes in medication dose or frequency. denies adverse side effects related to current therapy. no flares or current swelling - no falls  Clinical Review Panels:  CBC   WBC:  7.3 (11/01/2010)   RBC:  4.27 (11/01/2010)   Hgb:  14.4 (11/01/2010)   Hct:  41.0  (11/01/2010)   Platelets:  232.0 (11/01/2010)   MCV  95.9 (11/01/2010)   MCHC  35.1 (11/01/2010)   RDW  13.5 (11/01/2010)   PMN:  59.6 (11/01/2010)   Lymphs:  28.5 (11/01/2010)   Monos:  8.9 (11/01/2010)   Eosinophils:  2.5 (11/01/2010)   Basophil:  0.5 (11/01/2010)  Complete Metabolic Panel   Glucose:  75 (11/01/2010)   Sodium:  142 (11/01/2010)   Potassium:  4.8 (11/01/2010)   Chloride:  105 (11/01/2010)   CO2:  31 (11/01/2010)   BUN:  16 (11/01/2010)   Creatinine:  0.7 (11/01/2010)   Albumin:  3.9 (11/01/2010)   Total Protein:  6.6 (11/01/2010)   Calcium:  9.5 (11/01/2010)   Total Bili:  0.5 (11/01/2010)   Alk Phos:  54 (11/01/2010)   SGPT (ALT):  21 (11/01/2010)   SGOT (AST):  23 (11/01/2010)   Current Medications (verified): 1)  Metoprolol Tartrate 25 Mg Tabs (Metoprolol Tartrate) .... Take 1 Two Times A Day 2)  Multivitamins  Tabs (Multiple Vitamin) .... Take 1 By Mouth Once Daily 3)  Osteo Bi-Flex Joint Shield  Tabs (Misc Natural Products) .... Take 1 By Mouth Once Daily 4)  Vitamin B-12 1000 Mcg Tabs (Cyanocobalamin) .... Take 1 By Mouth Once Daily 5)  Citracal Plus  Tabs (Multiple Minerals-Vitamins) .Marland KitchenMarland KitchenMarland Kitchen  Take 1 By Mouth Once Daily 6)  Aspirin 81 Mg Tabs (Aspirin) .... Take 1 Three Times A Week 7)  Nexium 40 Mg Cpdr (Esomeprazole Magnesium) .Marland Kitchen.. 1 By Mouth Once Daily 8)  Levsin 0.125 Mg Tabs (Hyoscyamine Sulfate) .... Take One By Mouth Two Times A Day As Needed  Allergies (verified): No Known Drug Allergies  Past History:  Past Medical History: Osteoarthritis Osteopenia GERD Headache hx/migraines Hypertension   Review of Systems       The patient complains of severe indigestion/heartburn.  The patient denies anorexia, fever, weight loss, chest pain, syncope, headaches, hematochezia, muscle weakness, and suspicious skin lesions.    Physical Exam  General:  alert, well-developed, well-nourished, and cooperative to examination.   spouse at side Lungs:   normal respiratory effort, no intercostal retractions or use of accessory muscles; normal breath sounds bilaterally - no crackles and no wheezes.    Heart:  normal rate, regular rhythm, no murmur, and no rub. BLE without edema.  Abdomen:  soft, non-tender, normal bowel sounds, no distention; no masses and no appreciable hepatomegaly or splenomegaly.     Impression & Recommendations:  Problem # 1:  ABDOMINAL PAIN-LLQ (ICD-789.04)  pt concerned with ?allergy - check labs for same now Orders: T-Food Allergy Profile Specific IgE (86003/[address]) T-Celiac Disease Ab Evaluation (8002)  agree with GI opinion 10/2010 (reviewed today): Consistent with IBS and intermittent colonic spasms.  reassured no ulcers on EGD 10/2010 -  ?need for repeat colo -- defer to GI change PPI and pursue CT scan for ?other causes pain symptoms if continued pain and normal labs - pt/spouse understand and agree to same  Discussed symptom control with the patient.   Problem # 2:  RLQ PAIN (ICD-789.03)  Orders: T-Food Allergy Profile Specific IgE (86003/[address]) T-Celiac Disease Ab Evaluation (8002)  Problem # 3:  DIARRHEA (ICD-787.91)  Orders: T-Food Allergy Profile Specific IgE (86003/[address]) T-Celiac Disease Ab Evaluation (8002)  Discussed symptom control and diet. Call if worsening of symptoms or signs of dehydration.   Problem # 4:  GERD (ICD-530.81)  change PPI for attempted better control at reflux Her updated medication list for this problem includes:    Lansoprazole 30 Mg Tbdp (Lansoprazole) .Marland Kitchen... 1 by mouth once daily    Levsin 0.125 Mg Tabs (Hyoscyamine sulfate) .Marland Kitchen... Take one by mouth two times a day as needed  EGD: DONE (11/11/2010)  Labs Reviewed: Hgb: 14.4 (11/01/2010)   Hct: 41.0 (11/01/2010)  Orders: Prescription Created Electronically [phone])  Complete Medication List: 1)  Metoprolol Tartrate 25 Mg Tabs (Metoprolol tartrate) .... Take 1 two times a day 2)   Multivitamins Tabs (Multiple vitamin) .... Take 1 by mouth once daily 3)  Osteo Bi-flex Joint Shield Tabs (Misc natural products) .... Take 1 by mouth once daily 4)  Vitamin B-12 1000 Mcg Tabs (Cyanocobalamin) .... Take 1 by mouth once daily 5)  Citracal Plus Tabs (Multiple minerals-vitamins) .... Take 1 by mouth once daily 6)  Aspirin 81 Mg Tabs (Aspirin) .... Take 1 three times a week 7)  Lansoprazole 30 Mg Tbdp (Lansoprazole) .Marland Kitchen.. 1 by mouth once daily 8)  Levsin 0.125 Mg Tabs (Hyoscyamine sulfate) .... Take one by mouth two times a day as needed  Patient Instructions: 1)  it was good to see you today. 2)  tests and labs from recent GI evaluation reviewed today - everything normal 3)  stop nexium and start generic prevacid for acid reflux - your prescription has been electronically submitted to your pharmacy. Please  take as directed. Contact our office if you believe you're having problems with the medication(s).  4)  test(s) ordered today - your results will be called to you after review in 48-72 hours from the time of test completion; if any changes need to be made or there are abnormal results, you will be contacted directly. If these tests are normal, we will plan for CT scan of abdomen to look for other possible causes of pain. 5)  Please schedule a follow-up appointment in 3 months to review blood pressure and stomach symptoms, call sooner if problems.  Prescriptions: LANSOPRAZOLE 30 MG TBDP (LANSOPRAZOLE) 1 by mouth once daily  #30 x 3   Entered and Authorized by:   Newt Lukes MD   Signed by:   Newt Lukes MD on 12/25/2010   Method used:   Electronically to        [facility]       [address]       [place], [place]  [address]       Ph: [phone] or [phone]       Fax: [phone]   RxID:   [id number]    Orders Added: 1)  T-Food Allergy Profile Specific IgE [86003/[address]] 2)  T-Celiac Disease Ab Evaluation [8002] 3)  Est. Patient  Level IV [03474] 4)  Prescription Created Electronically [phone]

## 2011-01-02 ENCOUNTER — Ambulatory Visit (INDEPENDENT_AMBULATORY_CARE_PROVIDER_SITE_OTHER)
Admission: RE | Admit: 2011-01-02 | Discharge: 2011-01-02 | Disposition: A | Discharge status: Home / Self Care | Payer: Medicare Other | Source: Ambulatory Visit | Attending: Internal Medicine | Admitting: Internal Medicine

## 2011-01-02 DIAGNOSIS — R109 Unspecified abdominal pain: Secondary | ICD-10-CM

## 2011-01-02 HISTORY — DX: Essential (primary) hypertension: I10

## 2011-01-02 MED ORDER — IOHEXOL 300 MG/ML  SOLN
100.0000 mL | Freq: Once | INTRAMUSCULAR | Status: AC | PRN
  Administered 2011-01-02: 100 mL via INTRAVENOUS

## 2011-01-06 ENCOUNTER — Ambulatory Visit: Payer: Self-pay | Admitting: Internal Medicine

## 2011-03-25 ENCOUNTER — Ambulatory Visit (INDEPENDENT_AMBULATORY_CARE_PROVIDER_SITE_OTHER): Payer: Medicare Other | Admitting: Internal Medicine

## 2011-03-25 ENCOUNTER — Encounter: Payer: Self-pay | Admitting: Internal Medicine

## 2011-03-25 DIAGNOSIS — M7070 Other bursitis of hip, unspecified hip: Secondary | ICD-10-CM

## 2011-03-25 DIAGNOSIS — L600 Ingrowing nail: Secondary | ICD-10-CM

## 2011-03-25 DIAGNOSIS — K219 Gastro-esophageal reflux disease without esophagitis: Secondary | ICD-10-CM

## 2011-03-25 DIAGNOSIS — M76899 Other specified enthesopathies of unspecified lower limb, excluding foot: Secondary | ICD-10-CM

## 2011-03-25 DIAGNOSIS — I10 Essential (primary) hypertension: Secondary | ICD-10-CM

## 2011-03-25 MED ORDER — MELOXICAM 15 MG PO TABS: 15.0000 mg | ORAL_TABLET | Freq: Every day | ORAL | Status: DC

## 2011-03-25 MED ORDER — METOPROLOL SUCCINATE ER 50 MG PO TB24: 50.0000 mg | ORAL_TABLET | Freq: Every day | ORAL | Status: DC

## 2011-03-25 NOTE — Assessment & Plan Note (Signed)
symptoms much improved on prevacid (prev tx nexium and prilosec ineffective) status post GI eval 11/2010 and CT a/p 12/2010 - unremarkable The current medical regimen is effective;  continue present plan and medications.

## 2011-03-25 NOTE — Assessment & Plan Note (Signed)
Will change Bblo to 24h XR form at pt request to improve compliance - erx toprol xr done The current medical regimen is effective;  continue present plan and medications. BP Readings from Last 3 Encounters:  03/25/11 120/80  12/25/10 132/92  11/01/10 138/72

## 2011-03-25 NOTE — Progress Notes (Signed)
Subjective:    Patient ID: [name] [name], female    DOB: 1946-09-27, 65 y.o.   MRN: [id number]  HPI Here for follow up   GERD - improved epigastric pain on Prevacid (since 12/2010) present for 3+ years but worse in past 2 weeks prior EGD for same 2008 and again EGD 10/2010: no ulcers, s/p esoph dialtion of distal ring  not improved symptoms on omeprazol or nexium  s/p cardiac eval 06/2009 for same pain- echo and nuc stress neg CT a/p 12/2010 - unremarkable for cause of pain -   osteopenia hx - prev rx'd bisphos (aldentronate weekly) since 2004 (>7y) pt stopped mid 08/2010 due to inc stomach symptoms  - no hx fx, no bone pain  HTN - reports partial compliance with ongoing medical treatment and no changes in medication dose or frequency. denies adverse side effects related to current therapy. ?XR form Bbloc  OA - affects knees, hands, back.  - current pain in L buttock - radiates into L ham, no weakness. reports compliance with ongoing medical treatment and no changes in medication dose or frequency. denies adverse side effects related to current therapy. No weakness or falls  Past Medical History  Diagnosis Date  . Hx of migraines   . Hypertension   . GERD (gastroesophageal reflux disease)     Review of Systems  Constitutional: Negative for fever and unexpected weight change.  Respiratory: Negative for shortness of breath.   Cardiovascular: Negative for chest pain.  Gastrointestinal: Negative for blood in stool.  Neurological: Negative for weakness.       Objective:   Physical Exam BP 120/80  Pulse 98  Temp(Src) 98.3 F (36.8 C) (Oral)  Ht 5\' 2"  (1.575 m)  Wt 143 lb 12.8 oz (65.227 kg)  BMI 26.30 kg/m2  SpO2 98% Physical Exam  Constitutional: She is oriented to person, place, and time. She appears well-developed and well-nourished. No distress.  Eyes: Conjunctivae and EOM are normal. Pupils are equal, round, and reactive to light. No scleral icterus.  Neck: Normal  range of motion. Neck supple. No JVD present. No thyromegaly present.  Cardiovascular: Normal rate, regular rhythm and normal heart sounds.  No murmur heard. Pulmonary/Chest: Effort normal and breath sounds normal. No respiratory distress. She has no wheezes.  Abdominal: Soft. Bowel sounds are normal. She exhibits no distension. There is no tenderness.  Mskel: tenderness to palpation over L greater tuberosity - Back: full range of motion of lumbar spine. Non tender to vert palpation. Negative straight leg raise. DTR's are symmetrically intact. Sensation intact in all dermatomes of the lower extremities. Full strength to manual muscle testing, able to heel toe walk without difficulty and ambulates with a normal gait. Psychiatric: She has a normal mood and affect. Her behavior is normal. Judgment and thought content normal.   Lab Results  Component Value Date   WBC 7.3 11/01/2010   HGB 14.4 11/01/2010   HCT 41.0 11/01/2010   PLT 232.0 11/01/2010   CHOL 195 06/14/2010   HDL 46 06/14/2010   ALT 21 19/11/4780   AST 23 11/01/2010   NA 142 11/01/2010   K 4.8 11/01/2010   CL 105 11/01/2010   CREATININE 0.7 11/01/2010   BUN 16 11/01/2010   CO2 31 11/01/2010   TSH 0.82 11/01/2010        Assessment & Plan:  See problem list. Medications and labs reviewed today. Ingrown toenails R>L great toe - occassional pain but no inflammation on exam at present - refer  to podiatry for definitive treatment as needed L ischial bursitis - pain in L buttock to direct pressure - tx 2 weeks NSAIDs (meloxicam for qd dosing - erx done) and demonstarted stretch exercises for same - pt will call if unimproved with same for refer to ortho - ?injection vs back eval - pt agrees to call sooner if worse

## 2011-03-25 NOTE — Patient Instructions (Signed)
It was good to see you today. Change metorprolol to Toprol XR for extended release blood pressure medication Start meloxicam daily x 2 weeks and do stretches as demonstrated for your buttock and leg pain - if not improved, call for referral to orthopedist as discussed Your prescription(s) have been submitted to your pharmacy. Please take as directed and contact our office if you believe you are having problem(s) with the medication(s). we'll make referral to podiatry for your nails. Our office will contact you regarding appointment(s) once made. Please schedule followup in 3-4 months for blood pressure check and medication review, call sooner if problems.

## 2011-04-07 ENCOUNTER — Encounter: Payer: Self-pay | Admitting: Internal Medicine

## 2011-04-07 ENCOUNTER — Ambulatory Visit (INDEPENDENT_AMBULATORY_CARE_PROVIDER_SITE_OTHER): Payer: Medicare Other | Admitting: Internal Medicine

## 2011-04-07 VITALS — BP 122/72 | HR 69 | Temp 98.3°F | Ht 62.0 in | Wt 142.8 lb

## 2011-04-07 DIAGNOSIS — K589 Irritable bowel syndrome without diarrhea: Secondary | ICD-10-CM

## 2011-04-07 MED ORDER — HYOSCYAMINE SULFATE 0.125 MG PO TABS: 0.1250 mg | ORAL_TABLET | ORAL | Status: DC | PRN

## 2011-04-07 NOTE — Patient Instructions (Signed)
It was good to see you today. Increase levbid to every 4 hours as needed for cramps - but once diarrhea and cramps start, take the pills for 24h even after symptoms improve Your prescription(s) have been submitted to your pharmacy. Please take as directed and contact our office if you believe you are having problem(s) with the medication(s). Medications reviewed, no changes at this time. Please keep scheduled followup as planned, call sooner if problems.

## 2011-04-07 NOTE — Progress Notes (Signed)
Subjective:    Patient ID: [name] [name], female    DOB: May 16, 1946, 65 y.o.   MRN: [id number]  HPI  Here for diarrhea - intermittent hx same \\no  travel, blood in stool, nausea and vomiting, fever or weight loss Ongoing flare x 4 days - no nocturnal symptoms but worse with any food associated with cramping and abd pain before and during BMs Pain improved with levbid x 1 use - but then returned -  Also reviewed chronic medical issues:  GERD - improved epigastric pain on Prevacid (since 12/2010) present for 3+ years but worse in past 2 weeks prior EGD for same 2008 and again EGD 10/2010: no ulcers, s/p esoph dialtion of distal ring  not improved symptoms on omeprazol or nexium  s/p cardiac eval 06/2009 for same pain- echo and nuc stress neg CT a/p 12/2010 - unremarkable for cause of pain -   osteopenia hx - prev rx'd bisphos (aldentronate weekly) since 2004 (>7y) pt stopped mid 08/2010 due to inc stomach symptoms  - no hx fx, no bone pain  HTN - reports compliance with ongoing medical treatment and since changes in medication to XR toprol (qd) 12/2010. denies adverse side effects related to current therapy.  OA - affects knees, hands, back.  - current pain in L buttock - radiates into L ham, no weakness. reports compliance with ongoing medical treatment and no changes in medication dose or frequency. denies adverse side effects related to current therapy. No weakness or falls  Past Medical History  Diagnosis Date  . Hx of migraines   . Hypertension   . GERD (gastroesophageal reflux disease)     Review of Systems  Constitutional: Negative for fever and unexpected weight change.  Respiratory: Negative for shortness of breath.   Cardiovascular: Negative for chest pain.  Gastrointestinal: Negative for blood in stool.  Neurological: Negative for weakness.       Objective:   Physical Exam BP 122/72  Pulse 69  Temp(Src) 98.3 F (36.8 C) (Oral)  Ht 5\' 2"  (1.575 m)  Wt 142 lb  12.8 oz (64.774 kg)  BMI 26.12 kg/m2  SpO2 97% Physical Exam  Constitutional: She is oriented to person, place, and time. She appears well-developed and well-nourished. No distress. Cardiovascular: Normal rate, regular rhythm and normal heart sounds.  No murmur heard. Pulmonary/Chest: Effort normal and breath sounds normal. No respiratory distress. She has no wheezes.  Abdominal: Soft. Bowel sounds are normal. She exhibits no distension. There is no tenderness.  Psychiatric: She has a normal mood and affect. Her behavior is normal. Judgment and thought content normal.   Lab Results  Component Value Date   WBC 7.3 11/01/2010   HGB 14.4 11/01/2010   HCT 41.0 11/01/2010   PLT 232.0 11/01/2010   CHOL 195 06/14/2010   HDL 46 06/14/2010   ALT 21 40/07/8118   AST 23 11/01/2010   NA 142 11/01/2010   K 4.8 11/01/2010   CL 105 11/01/2010   CREATININE 0.7 11/01/2010   BUN 16 11/01/2010   CO2 31 11/01/2010   TSH 0.82 11/01/2010        Assessment & Plan:  See problem list. Medications and labs reviewed today.  IBS - increase Levbid - see problem list below - erx done - prior GI eval 10/2010 reviewed  Ingrown toenails R>L great toe - occassional pain but no inflammation on exam at present - eval by podiatry sched this week for definitive treatment as needed  L ischial bursitis - improved  with meloxicam - pain in L buttock to direct pressure is less - cont stretch exercises for same and prn nsaids - pt will call if unimproved with same for refer to ortho - ?injection vs back eval - pt agrees to call sooner if worse

## 2011-04-07 NOTE — Assessment & Plan Note (Signed)
Long hx same with abdominal pain, diarrhea predominate GI eval 09/2010 for same ([facility]) - also CT a/p 12/2010 unremarkable Celiac testing negative Temporary relief with levbid - will increase dose freq at ths time - new erx done - consider ext release if symptoms require freq dosing Reassurance provided - no red flags like travel, blood, fever, dehydration or weight loss

## 2011-05-13 ENCOUNTER — Other Ambulatory Visit: Payer: Self-pay | Admitting: *Deleted

## 2011-05-13 DIAGNOSIS — I10 Essential (primary) hypertension: Secondary | ICD-10-CM

## 2011-05-13 MED ORDER — METOPROLOL SUCCINATE ER 50 MG PO TB24: 50.0000 mg | ORAL_TABLET | Freq: Every day | ORAL | Status: DC

## 2011-06-25 ENCOUNTER — Ambulatory Visit: Payer: Medicare Other | Admitting: Internal Medicine

## 2011-06-25 DIAGNOSIS — Z0289 Encounter for other administrative examinations: Secondary | ICD-10-CM

## 2011-07-04 ENCOUNTER — Ambulatory Visit (INDEPENDENT_AMBULATORY_CARE_PROVIDER_SITE_OTHER): Payer: Medicare Other | Admitting: Internal Medicine

## 2011-07-04 ENCOUNTER — Encounter: Payer: Self-pay | Admitting: Internal Medicine

## 2011-07-04 DIAGNOSIS — K589 Irritable bowel syndrome without diarrhea: Secondary | ICD-10-CM

## 2011-07-04 DIAGNOSIS — G43909 Migraine, unspecified, not intractable, without status migrainosus: Secondary | ICD-10-CM

## 2011-07-04 DIAGNOSIS — K219 Gastro-esophageal reflux disease without esophagitis: Secondary | ICD-10-CM

## 2011-07-04 DIAGNOSIS — I10 Essential (primary) hypertension: Secondary | ICD-10-CM

## 2011-07-04 NOTE — Assessment & Plan Note (Signed)
symptoms much improved on prevacid (prev tx nexium and prilosec ineffective) status post GI eval 11/2010 and CT a/p 12/2010 - unremarkable The current medical regimen is effective;  continue present plan and medications.

## 2011-07-04 NOTE — Assessment & Plan Note (Signed)
Long hx same with abdominal pain, diarrhea predominate GI eval 09/2010 for same ([facility]) - also CT a/p 12/2010 unremarkable Celiac testing negative Temporary relief with levbid - continue same prn

## 2011-07-04 NOTE — Progress Notes (Signed)
  Subjective:    Patient ID: [name] [name], female    DOB: 08/08/1946, 65 y.o.   MRN: [id number]  HPI  Here for follow up -reviewed chronic medical issues:  IBS -diarrhea predominate exac symptoms with stress  Improved with careful attention to diet  GERD - improved with intermittent Prevacid use since 12/2010 prior EGD for same 2008 and again EGD 10/2010: no ulcers, s/p esoph dialtion of distal ring  not improved symptoms on omeprazol or nexium  s/p cardiac eval 06/2009 for same pain- echo and nuc stress neg CT a/p 12/2010 - unremarkable -   osteopenia hx - prev rx'd bisphos (aldentronate weekly) since 2004 (>7y) pt stopped mid 08/2010 due to inc stomach symptoms  - no hx fx, no bone pain  HTN - reports compliance with ongoing medical treatment and since changes in medication to XR toprol (qd) 12/2010. denies adverse side effects related to current therapy.  OA - affects knees, hands, back. Uses OTC meds for same>mobic - reports compliance with ongoing medical treatment and no changes in medication dose or frequency. denies adverse side effects related to current therapy. No weakness or falls  Past Medical History  Diagnosis Date  . Hx of migraines   . Hypertension   . GERD (gastroesophageal reflux disease)   . IBS (irritable bowel syndrome)     diarrhea predom    Review of Systems  Constitutional: Negative for fever and unexpected weight change.  Respiratory: Negative for shortness of breath.   Cardiovascular: Negative for chest pain.  Gastrointestinal: Negative for blood in stool.  Neurological: Negative for weakness.       Objective:   Physical Exam  BP 128/80  Pulse 68  Temp(Src) 98.3 F (36.8 C) (Oral)  Ht 5\' 2"  (1.575 m)  Wt 145 lb 3.2 oz (65.862 kg)  BMI 26.56 kg/m2  SpO2 97% Constitutional: She is oriented to person, place, and time. She appears well-developed and well-nourished. No distress. Cardiovascular: Normal rate, regular rhythm and normal heart  sounds.  No murmur heard. no BLE edema Pulmonary/Chest: Effort normal and breath sounds normal. No respiratory distress. She has no wheezes.  Abdominal: Soft. Bowel sounds are normal. She exhibits no distension. There is no tenderness.  Neuro - AAOx4 - CN2-12 symmetrically intact - MAE all well 5/5 and balance/cognition/speech normal Psychiatric: She has a normal mood and affect. Her behavior is normal. Judgment and thought content normal.   Lab Results  Component Value Date   WBC 7.3 11/01/2010   HGB 14.4 11/01/2010   HCT 41.0 11/01/2010   PLT 232.0 11/01/2010   CHOL 195 06/14/2010   HDL 46 06/14/2010   ALT 21 69/04/2951   AST 23 11/01/2010   NA 142 11/01/2010   K 4.8 11/01/2010   CL 105 11/01/2010   CREATININE 0.7 11/01/2010   BUN 16 11/01/2010   CO2 31 11/01/2010   TSH 0.82 11/01/2010        Assessment & Plan:  See problem list. Medications and labs reviewed today.

## 2011-07-04 NOTE — Patient Instructions (Signed)
It was good to see you today. We have reviewed your prior records including labs and tests today Medications reviewed, no changes at this time. Please schedule followup in 6 months for blood pressure check and review, call sooner if problems.

## 2011-07-04 NOTE — Assessment & Plan Note (Signed)
Mild but increase with weather change - remotely took Fioricet for same - place on med list and refill as needed

## 2011-07-04 NOTE — Assessment & Plan Note (Signed)
Doing well - changed Bbloc to 24h XR in 03/2011 at pt request to improve compliance - The current medical regimen is effective;  continue present plan and medications. BP Readings from Last 3 Encounters:  07/04/11 128/80  04/07/11 122/72  03/25/11 120/80

## 2011-10-13 ENCOUNTER — Ambulatory Visit (INDEPENDENT_AMBULATORY_CARE_PROVIDER_SITE_OTHER): Payer: Medicare Other | Admitting: Internal Medicine

## 2011-10-13 ENCOUNTER — Encounter: Payer: Self-pay | Admitting: Internal Medicine

## 2011-10-13 VITALS — BP 132/80 | HR 74 | Temp 98.2°F

## 2011-10-13 DIAGNOSIS — J069 Acute upper respiratory infection, unspecified: Secondary | ICD-10-CM

## 2011-10-13 DIAGNOSIS — Z23 Encounter for immunization: Secondary | ICD-10-CM

## 2011-10-13 MED ORDER — BUTALBITAL-APAP-CAFFEINE 50-325-40 MG PO CAPS: 1.0000 | ORAL_CAPSULE | ORAL | Status: DC | PRN

## 2011-10-13 NOTE — Patient Instructions (Signed)
It was good to see you today. If you develop worsening symptoms or fever, call and we can reconsider antibiotics, but it does not appear necessary to use antibiotics at this time. Continue symptomatic care as ongoing and hydration or salt water gargles Refill on medication(s) as discussed today.

## 2011-10-13 NOTE — Progress Notes (Signed)
  Subjective:    Patient ID: [name] [name], female    DOB: 11/15/1946, 65 y.o.   MRN: [id number]  Sore Throat  This is a new problem. The current episode started in the past 7 days. The problem has been gradually improving. Neither side of throat is experiencing more pain than the other. There has been no fever. The pain is at a severity of 3/10. The pain is mild. Associated symptoms include congestion, coughing, headaches and a hoarse voice. Pertinent negatives include no ear discharge, ear pain, neck pain, shortness of breath, swollen glands or trouble swallowing. She has had no exposure to strep or mono. She has tried NSAIDs, cool liquids and gargles for the symptoms. The treatment provided moderate relief.   Past Medical History  Diagnosis Date  . Hx of migraines   . Hypertension   . GERD (gastroesophageal reflux disease)   . IBS (irritable bowel syndrome)     diarrhea predom    Review of Systems  HENT: Positive for congestion and hoarse voice. Negative for ear pain, trouble swallowing, neck pain and ear discharge.   Respiratory: Positive for cough. Negative for shortness of breath.   Neurological: Positive for headaches.       Objective:   Physical Exam BP 132/80  Pulse 74  Temp(Src) 98.2 F (36.8 C) (Oral)  SpO2 96% Wt Readings from Last 3 Encounters:  07/04/11 145 lb 3.2 oz (65.862 kg)  04/07/11 142 lb 12.8 oz (64.774 kg)  03/25/11 143 lb 12.8 oz (65.227 kg)   Constitutional: She appears well-developed and well-nourished. No distress.  HENT: Head: Normocephalic and atraumatic. Ears: B TMs ok, no erythema or effusion; Nose: Nose normal.  Mouth/Throat: Oropharynx is clear and moist. No oropharyngeal exudate.  Eyes: Conjunctivae and EOM are normal. Pupils are equal, round, and reactive to light. No scleral icterus.  Neck: Normal range of motion. Neck supple. No JVD present. No thyromegaly present.  Cardiovascular: Normal rate, regular rhythm and normal heart sounds.  No  murmur heard. No BLE edema. Pulmonary/Chest: Effort normal and breath sounds normal. No respiratory distress. She has no wheezes. Psychiatric: She has a normal mood and affect. Her behavior is normal. Judgment and thought content normal.   Lab Results  Component Value Date   WBC 7.3 11/01/2010   HGB 14.4 11/01/2010   HCT 41.0 11/01/2010   PLT 232.0 11/01/2010   GLUCOSE 75 11/01/2010   CHOL 195 06/14/2010   HDL 46 06/14/2010   LDLCALC 114 06/14/2010   ALT 21 11/01/2010   AST 23 11/01/2010   NA 142 11/01/2010   K 4.8 11/01/2010   CL 105 11/01/2010   CREATININE 0.7 11/01/2010   BUN 16 11/01/2010   CO2 31 11/01/2010   TSH 0.82 11/01/2010   Lab Results  Component Value Date   VITAMINB12 1371* 11/01/2010       Assessment & Plan:  Viral URI, reassurance provided - no role for antibiotics at this time symptomatic care advised, to call if symptoms worse

## 2012-01-07 ENCOUNTER — Ambulatory Visit: Payer: Medicare Other | Admitting: Internal Medicine

## 2012-01-14 ENCOUNTER — Ambulatory Visit (INDEPENDENT_AMBULATORY_CARE_PROVIDER_SITE_OTHER): Payer: Medicare Other | Admitting: Internal Medicine

## 2012-01-14 ENCOUNTER — Encounter: Payer: Self-pay | Admitting: Internal Medicine

## 2012-01-14 VITALS — BP 132/70 | HR 70 | Temp 98.3°F | Ht 62.0 in | Wt 145.8 lb

## 2012-01-14 DIAGNOSIS — I10 Essential (primary) hypertension: Secondary | ICD-10-CM

## 2012-01-14 DIAGNOSIS — M899 Disorder of bone, unspecified: Secondary | ICD-10-CM

## 2012-01-14 DIAGNOSIS — Z1211 Encounter for screening for malignant neoplasm of colon: Secondary | ICD-10-CM

## 2012-01-14 DIAGNOSIS — M949 Disorder of cartilage, unspecified: Secondary | ICD-10-CM

## 2012-01-14 DIAGNOSIS — Z1239 Encounter for other screening for malignant neoplasm of breast: Secondary | ICD-10-CM

## 2012-01-14 DIAGNOSIS — Z23 Encounter for immunization: Secondary | ICD-10-CM

## 2012-01-14 DIAGNOSIS — Z0000 Encounter for general adult medical examination without abnormal findings: Secondary | ICD-10-CM

## 2012-01-14 NOTE — Patient Instructions (Addendum)
It was good to see you today. We have reviewed your prior records including labs and tests today Medications reviewed, no changes at this time. Health Maintenance reviewed - will refer for mammogram, colonoscopy and schedule bone density; also tetanus booster and pneumonia shots given today. Please check with your insurance regarding coverage for shingles shot - you can have the shot administered by your pharmacy or  our office can give you the shot. Test(s) ordered today. Return later this week when you're fasting -Your results will be called to you after review (48-72hours after test completion). If any changes need to be made, you will be notified at that time. Please schedule followup in 6-12 months for blood pressure check and review, call sooner if problems.

## 2012-01-14 NOTE — Progress Notes (Signed)
Subjective:    Patient ID: [name] [name], female    DOB: 10/23/1946, 66 y.o.   MRN: [id number]  HPI  Here for annual wellness exam and review  Diet: heart healthy  Physical activity: sedentary to minimally active Depression/mood screen: negative Hearing: intact to whispered voice Visual acuity: grossly normal, performs annual eye exam  ADLs: capable Fall risk: none Home safety: good Cognitive evaluation: intact to orientation, naming, recall and repetition EOL planning: adv directives, full code/ I agree  I have personally reviewed and have noted 1. The patient's medical and social history 2. Their use of alcohol, tobacco or illicit drugs 3. Their current medications and supplements 4. The patient's functional ability including ADL's, fall risks, home safety risks and hearing or visual impairment. 5. Diet and physical activities 6. Evidence for depression or mood disorders   Also reviewed chronic medical issues:  IBS -diarrhea predominate exac symptoms with stress - severe cramping, relieved with Levsin as needed Improved with careful attention to diet  GERD - improved with intermittent Prevacid use since 12/2010 prior EGD for same 2008 and again EGD 10/2010: no ulcers, s/p esoph dialtion of distal ring  not improved symptoms on omeprazol or nexium  s/p cardiac eval 06/2009 for same pain- echo and nuc stress neg CT a/p 12/2010 - unremarkable -   osteopenia - previously prescribed bisphosphonates (aldentronate weekly) since 2004 (>7y) pt stopped mid 08/2010 due to stomach and esophageal stricture symptoms  - no history fracture, no bone pain  hypertension - reports compliance with ongoing medical treatment and since change in medication to XR toprol (qd) 12/2010. denies adverse side effects related to current therapy.  Generalized osteoarthritis - affects knees, hands, back. Uses OTC meds for same>mobic - reports compliance with ongoing medical treatment and no changes in  medication dose or frequency. denies adverse side effects related to current therapy. No weakness or falls  Past Medical History  Diagnosis Date  . Hx of migraines   . Hypertension   . GERD (gastroesophageal reflux disease)     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  . IBS (irritable bowel syndrome)     diarrhea predom   Family History  Problem Relation Age of Onset  . Hypertension Mother     parent not sure which 1  . Stroke Mother     parent not sure which 1   History  Substance Use Topics  . Smoking status: Never Smoker   . Smokeless tobacco: Not on file  . Alcohol Use: No     Review of Systems  Constitutional: Negative for fever and unexpected weight change.  Respiratory: Negative for shortness of breath.   Cardiovascular: Negative for chest pain.  Gastrointestinal: Negative for blood in stool.  Neurological: Negative for weakness.  No other specific complaints in a complete review of systems (except as listed in HPI above).      Objective:   Physical Exam  BP 132/70  Pulse 70  Temp(Src) 98.3 F (36.8 C) (Oral)  Ht 5\' 2"  (1.575 m)  Wt 145 lb 12.8 oz (66.134 kg)  BMI 26.67 kg/m2  SpO2 98% Wt Readings from Last 3 Encounters:  01/14/12 145 lb 12.8 oz (66.134 kg)  07/04/11 145 lb 3.2 oz (65.862 kg)  04/07/11 142 lb 12.8 oz (64.774 kg)   Constitutional: She appears well-developed and well-nourished. No distress. HENT: NCAT, sinus non tender - OP clear, nose normal Eyes: corrected vision intact - PERRL, no icterus or conjunctivitis Cardiovascular: Normal rate, regular rhythm and normal heart  sounds.  No murmur heard. no BLE edema Pulmonary/Chest: Effort normal and breath sounds normal. No respiratory distress. She has no wheezes.  Abdominal: Soft. Bowel sounds are normal. She exhibits no distension. There is no tenderness.  Neurologic - AAOx4 - CN2-12 symmetrically intact - MAE all well 5/5 and balance/cognition/speech normal Skin - senile changes - no rash or  bruise. Psychiatric: She has a normal mood and affect. Her behavior is normal. Judgment and thought content normal.   Lab Results  Component Value Date   WBC 7.3 11/01/2010   HGB 14.4 11/01/2010   HCT 41.0 11/01/2010   PLT 232.0 11/01/2010   CHOL 195 06/14/2010   HDL 46 06/14/2010   ALT 21 16/11/958   AST 23 11/01/2010   NA 142 11/01/2010   K 4.8 11/01/2010   CL 105 11/01/2010   CREATININE 0.7 11/01/2010   BUN 16 11/01/2010   CO2 31 11/01/2010   TSH 0.82 11/01/2010        Assessment & Plan:  CPX - v70.0 - Today patient counseled on age appropriate routine health concerns for screening and prevention, each reviewed and up to date or declined. Immunizations reviewed and up to date or declined. Labs ordered and will be reviewed. Risk factors for depression reviewed and negative. Hearing function and visual acuity are intact. ADLs screened and addressed as needed. Functional ability and level of safety reviewed and appropriate. Education, counseling and referrals performed based on assessed risks today. Patient provided with a copy of personalized plan for preventive services.   Also see problem list. Medications and labs reviewed today.

## 2012-01-14 NOTE — Assessment & Plan Note (Signed)
Doing well - changed Bbloc to 24h XR in 03/2011 at pt request to improve compliance - The current medical regimen is effective;  continue present plan and medications. BP Readings from Last 3 Encounters:  01/14/12 132/70  10/13/11 132/80  07/04/11 128/80

## 2012-01-14 NOTE — Assessment & Plan Note (Signed)
Previous weekly Fosamax 2004 through October 2011 - stopped same because of GI reflux symptoms and esophageal stricture Recheck DEXA now to monitor Advised continued calcium plus vitamin D supplements and weightbearing exercise

## 2012-01-21 ENCOUNTER — Other Ambulatory Visit (INDEPENDENT_AMBULATORY_CARE_PROVIDER_SITE_OTHER): Payer: Medicare Other

## 2012-01-21 ENCOUNTER — Ambulatory Visit (INDEPENDENT_AMBULATORY_CARE_PROVIDER_SITE_OTHER)
Admission: RE | Admit: 2012-01-21 | Discharge: 2012-01-21 | Disposition: A | Discharge status: Home / Self Care | Payer: Medicare Other | Source: Ambulatory Visit

## 2012-01-21 DIAGNOSIS — I10 Essential (primary) hypertension: Secondary | ICD-10-CM

## 2012-01-21 DIAGNOSIS — Z0000 Encounter for general adult medical examination without abnormal findings: Secondary | ICD-10-CM

## 2012-01-21 DIAGNOSIS — M949 Disorder of cartilage, unspecified: Secondary | ICD-10-CM

## 2012-01-21 DIAGNOSIS — M899 Disorder of bone, unspecified: Secondary | ICD-10-CM

## 2012-01-21 LAB — CBC WITH DIFFERENTIAL/PLATELET
Basophils Absolute: 0.1 10*3/uL (ref 0.0–0.1)
Eosinophils Relative: 3.6 % (ref 0.0–5.0)
Lymphocytes Relative: 24.4 % (ref 12.0–46.0)
Monocytes Relative: 9 % (ref 3.0–12.0)
Platelets: 211 10*3/uL (ref 150.0–400.0)
RDW: 13.6 % (ref 11.5–14.6)
WBC: 7.4 10*3/uL (ref 4.5–10.5)

## 2012-01-21 LAB — HEPATIC FUNCTION PANEL
ALT: 16 U/L (ref 0–35)
AST: 23 U/L (ref 0–37)
Bilirubin, Direct: 0.1 mg/dL (ref 0.0–0.3)
Total Bilirubin: 0.6 mg/dL (ref 0.3–1.2)
Total Protein: 6.8 g/dL (ref 6.0–8.3)

## 2012-01-21 LAB — BASIC METABOLIC PANEL
BUN: 16 mg/dL (ref 6–23)
Calcium: 9.1 mg/dL (ref 8.4–10.5)
GFR: 96.74 mL/min (ref >60.00)
Glucose, Bld: 89 mg/dL (ref 70–99)
Sodium: 140 mEq/L (ref 135–145)

## 2012-01-21 LAB — LIPID PANEL
Cholesterol: 190 mg/dL (ref 0–200)
HDL: 48.7 mg/dL (ref >39.00)

## 2012-01-21 LAB — URINALYSIS, ROUTINE W REFLEX MICROSCOPIC
Nitrite: NEGATIVE
Specific Gravity, Urine: 1.01 (ref 1.000–1.030)
Urobilinogen, UA: 0.2 (ref 0.0–1.0)

## 2012-01-21 LAB — TSH: TSH: 0.77 u[IU]/mL (ref 0.35–5.50)

## 2012-01-22 LAB — VITAMIN D 25 HYDROXY (VIT D DEFICIENCY, FRACTURES): Vit D, 25-Hydroxy: 51 ng/mL (ref 30–89)

## 2012-01-29 ENCOUNTER — Telehealth: Payer: Self-pay | Admitting: Internal Medicine

## 2012-01-29 NOTE — Telephone Encounter (Signed)
Received 7 pages from [facility], sent to Dr. Felicity Coyer. [place] 01/29/12.

## 2012-01-30 ENCOUNTER — Telehealth: Payer: Self-pay | Admitting: Gastroenterology

## 2012-01-30 ENCOUNTER — Encounter: Payer: Self-pay | Admitting: Internal Medicine

## 2012-01-30 NOTE — Telephone Encounter (Signed)
Explained to pt the process of switching doctors. When informed that if Dr. Leone Payor decided not to take her on as a pt. She may have to find another GI practice. Pt decided not to change.

## 2012-02-02 ENCOUNTER — Encounter: Payer: Self-pay | Admitting: Internal Medicine

## 2012-02-03 ENCOUNTER — Encounter: Payer: Self-pay | Admitting: Internal Medicine

## 2012-02-04 ENCOUNTER — Ambulatory Visit
Admission: RE | Admit: 2012-02-04 | Discharge: 2012-02-04 | Disposition: A | Discharge status: Home / Self Care | Payer: Medicare Other | Source: Ambulatory Visit | Attending: Internal Medicine | Admitting: Internal Medicine

## 2012-02-04 DIAGNOSIS — Z1239 Encounter for other screening for malignant neoplasm of breast: Secondary | ICD-10-CM

## 2012-02-12 ENCOUNTER — Telehealth: Payer: Self-pay | Admitting: Internal Medicine

## 2012-02-12 NOTE — Telephone Encounter (Signed)
Received copies from [facility] ,on 02/12/12 . Forwarded  4 pages to Dr. Felicity Coyer ,for review.

## 2012-02-17 ENCOUNTER — Encounter: Payer: Self-pay | Admitting: Internal Medicine

## 2012-03-15 ENCOUNTER — Emergency Department (HOSPITAL_COMMUNITY)
Admission: EM | Admit: 2012-03-15 | Discharge: 2012-03-15 | Disposition: A | Discharge status: Home / Self Care | Payer: Medicare Other | Attending: Emergency Medicine | Admitting: Emergency Medicine

## 2012-03-15 ENCOUNTER — Emergency Department (HOSPITAL_COMMUNITY): Payer: Medicare Other

## 2012-03-15 ENCOUNTER — Encounter (HOSPITAL_COMMUNITY): Payer: Self-pay | Admitting: Emergency Medicine

## 2012-03-15 DIAGNOSIS — R0602 Shortness of breath: Secondary | ICD-10-CM | POA: Insufficient documentation

## 2012-03-15 DIAGNOSIS — I10 Essential (primary) hypertension: Secondary | ICD-10-CM

## 2012-03-15 DIAGNOSIS — K589 Irritable bowel syndrome without diarrhea: Secondary | ICD-10-CM | POA: Insufficient documentation

## 2012-03-15 DIAGNOSIS — Z79899 Other long term (current) drug therapy: Secondary | ICD-10-CM | POA: Insufficient documentation

## 2012-03-15 DIAGNOSIS — M79606 Pain in leg, unspecified: Secondary | ICD-10-CM

## 2012-03-15 DIAGNOSIS — K219 Gastro-esophageal reflux disease without esophagitis: Secondary | ICD-10-CM | POA: Insufficient documentation

## 2012-03-15 DIAGNOSIS — M79609 Pain in unspecified limb: Secondary | ICD-10-CM | POA: Insufficient documentation

## 2012-03-15 LAB — CBC
HCT: 42.2 % (ref 36.0–46.0)
Hemoglobin: 14.3 g/dL (ref 12.0–15.0)
MCH: 32.3 pg (ref 26.0–34.0)
MCV: 95.3 fL (ref 78.0–100.0)
RBC: 4.43 MIL/uL (ref 3.87–5.11)

## 2012-03-15 LAB — DIFFERENTIAL
Eosinophils Absolute: 0.3 10*3/uL (ref 0.0–0.7)
Eosinophils Relative: 4 % (ref 0–5)
Lymphs Abs: 2.5 10*3/uL (ref 0.7–4.0)
Monocytes Absolute: 1 10*3/uL (ref 0.1–1.0)
Monocytes Relative: 12 % (ref 3–12)

## 2012-03-15 LAB — POCT I-STAT, CHEM 8
BUN: 20 mg/dL (ref 6–23)
Calcium, Ion: 1.23 mmol/L (ref 1.12–1.32)
Chloride: 110 mEq/L (ref 96–112)
Creatinine, Ser: 1 mg/dL (ref 0.50–1.10)
Glucose, Bld: 122 mg/dL — ABNORMAL HIGH (ref 70–99)
Glucose, Bld: 99 mg/dL (ref 70–99)
Hemoglobin: 15.3 g/dL — ABNORMAL HIGH (ref 12.0–15.0)
TCO2: 27 mmol/L (ref 0–100)

## 2012-03-15 LAB — POCT I-STAT TROPONIN I: Troponin i, poc: 0.03 ng/mL (ref 0.00–0.08)

## 2012-03-15 LAB — D-DIMER, QUANTITATIVE: D-Dimer, Quant: 0.31 ug/mL-FEU (ref 0.00–0.48)

## 2012-03-15 MED ORDER — ONDANSETRON HCL 4 MG/2ML IJ SOLN
4.0000 mg | Freq: Once | INTRAMUSCULAR | Status: AC
  Administered 2012-03-15: 4 mg via INTRAVENOUS

## 2012-03-15 MED ORDER — ONDANSETRON HCL 4 MG/2ML IJ SOLN
INTRAMUSCULAR | Status: AC
  Filled 2012-03-15: qty 2

## 2012-03-15 NOTE — Discharge Instructions (Signed)
Arterial Hypertension Arterial hypertension (high blood pressure) is a condition of elevated pressure in your blood vessels. Hypertension over a long period of time is a risk factor for strokes, heart attacks, and heart failure. It is also the leading cause of kidney (renal) failure.  CAUSES   In Adults -- Over 90% of all hypertension has no known cause. This is called essential or primary hypertension. In the other 10% of people with hypertension, the increase in blood pressure is caused by another disorder. This is called secondary hypertension. Important causes of secondary hypertension are:   Heavy alcohol use.   Obstructive sleep apnea.   Hyperaldosterosim (Conn's syndrome).   Steroid use.   Chronic kidney failure.   Hyperparathyroidism.   Medications.   Renal artery stenosis.   Pheochromocytoma.   Cushing's disease.   Coarctation of the aorta.   Scleroderma renal crisis.   Licorice (in excessive amounts).   Drugs (cocaine, methamphetamine).  Your caregiver can explain any items above that apply to you.  In Children -- Secondary hypertension is more common and should always be considered.   Pregnancy -- Few women of childbearing age have high blood pressure. However, up to 10% of them develop hypertension of pregnancy. Generally, this will not harm the woman. It may be a sign of 3 complications of pregnancy: preeclampsia, HELLP syndrome, and eclampsia. Follow up and control with medication is necessary.  SYMPTOMS   This condition normally does not produce any noticeable symptoms. It is usually found during a routine exam.   Malignant hypertension is a late problem of high blood pressure. It may have the following symptoms:   Headaches.   Blurred vision.   End-organ damage (this means your kidneys, heart, lungs, and other organs are being damaged).   Stressful situations can increase the blood pressure. If a person with normal blood pressure has their blood  pressure go up while being seen by their caregiver, this is often termed "white coat hypertension." Its importance is not known. It may be related with eventually developing hypertension or complications of hypertension.   Hypertension is often confused with mental tension, stress, and anxiety.  DIAGNOSIS  The diagnosis is made by 3 separate blood pressure measurements. They are taken at least 1 week apart from each other. If there is organ damage from hypertension, the diagnosis may be made without repeat measurements. Hypertension is usually identified by having blood pressure readings:  Above 140/90 mmHg measured in both arms, at 3 separate times, over a couple weeks.   Over 130/80 mmHg should be considered a risk factor and may require treatment in patients with diabetes.  Blood pressure readings over 120/80 mmHg are called "pre-hypertension" even in non-diabetic patients. To get a true blood pressure measurement, use the following guidelines. Be aware of the factors that can alter blood pressure readings.  Take measurements at least 1 hour after caffeine.   Take measurements 30 minutes after smoking and without any stress. This is another reason to quit smoking - it raises your blood pressure.   Use a proper cuff size. Ask your caregiver if you are not sure about your cuff size.   Most home blood pressure cuffs are automatic. They will measure systolic and diastolic pressures. The systolic pressure is the pressure reading at the start of sounds. Diastolic pressure is the pressure at which the sounds disappear. If you are elderly, measure pressures in multiple postures. Try sitting, lying or standing.   Sit at rest for a minimum of   5 minutes before taking measurements.   You should not be on any medications like decongestants. These are found in many cold medications.   Record your blood pressure readings and review them with your caregiver.  If you have hypertension:  Your caregiver  may do tests to be sure you do not have secondary hypertension (see "causes" above).   Your caregiver may also look for signs of metabolic syndrome. This is also called Syndrome X or Insulin Resistance Syndrome. You may have this syndrome if you have type 2 diabetes, abdominal obesity, and abnormal blood lipids in addition to hypertension.   Your caregiver will take your medical and family history and perform a physical exam.   Diagnostic tests may include blood tests (for glucose, cholesterol, potassium, and kidney function), a urinalysis, or an EKG. Other tests may also be necessary depending on your condition.  PREVENTION  There are important lifestyle issues that you can adopt to reduce your chance of developing hypertension:  Maintain a normal weight.   Limit the amount of salt (sodium) in your diet.   Exercise often.   Limit alcohol intake.   Get enough potassium in your diet. Discuss specific advice with your caregiver.   Follow a DASH diet (dietary approaches to stop hypertension). This diet is rich in fruits, vegetables, and low-fat dairy products, and avoids certain fats.  PROGNOSIS  Essential hypertension cannot be cured. Lifestyle changes and medical treatment can lower blood pressure and reduce complications. The prognosis of secondary hypertension depends on the underlying cause. Many people whose hypertension is controlled with medicine or lifestyle changes can live a normal, healthy life.  RISKS AND COMPLICATIONS  While high blood pressure alone is not an illness, it often requires treatment due to its short- and long-term effects on many organs. Hypertension increases your risk for:  CVAs or strokes (cerebrovascular accident).   Heart failure due to chronically high blood pressure (hypertensive cardiomyopathy).   Heart attack (myocardial infarction).   Damage to the retina (hypertensive retinopathy).   Kidney failure (hypertensive nephropathy).  Your caregiver can  explain list items above that apply to you. Treatment of hypertension can significantly reduce the risk of complications. TREATMENT   For overweight patients, weight loss and regular exercise are recommended. Physical fitness lowers blood pressure.   Mild hypertension is usually treated with diet and exercise. A diet rich in fruits and vegetables, fat-free dairy products, and foods low in fat and salt (sodium) can help lower blood pressure. Decreasing salt intake decreases blood pressure in a 1/3 of people.   Stop smoking if you are a smoker.  The steps above are highly effective in reducing blood pressure. While these actions are easy to suggest, they are difficult to achieve. Most patients with moderate or severe hypertension end up requiring medications to bring their blood pressure down to a normal level. There are several classes of medications for treatment. Blood pressure pills (antihypertensives) will lower blood pressure by their different actions. Lowering the blood pressure by 10 mmHg may decrease the risk of complications by as much as 25%. The goal of treatment is effective blood pressure control. This will reduce your risk for complications. Your caregiver will help you determine the best treatment for you according to your lifestyle. What is excellent treatment for one person, may not be for you. HOME CARE INSTRUCTIONS   Do not smoke.   Follow the lifestyle changes outlined in the "Prevention" section.   If you are on medications, follow the directions   carefully. Blood pressure medications must be taken as prescribed. Skipping doses reduces their benefit. It also puts you at risk for problems.   Follow up with your caregiver, as directed.   If you are asked to monitor your blood pressure at home, follow the guidelines in the "Diagnosis" section above.  SEEK MEDICAL CARE IF:   You think you are having medication side effects.   You have recurrent headaches or lightheadedness.     You have swelling in your ankles.   You have trouble with your vision.  SEEK IMMEDIATE MEDICAL CARE IF:   You have sudden onset of chest pain or pressure, difficulty breathing, or other symptoms of a heart attack.   You have a severe headache.   You have symptoms of a stroke (such as sudden weakness, difficulty speaking, difficulty walking).  MAKE SURE YOU:   Understand these instructions.   Will watch your condition.   Will get help right away if you are not doing well or get worse.  Document Released: 11/10/2005 Document Revised: 10/30/2011 Document Reviewed: 06/10/2007 [employer] Patient Information 2012 [employer]. 

## 2012-03-15 NOTE — ED Provider Notes (Signed)
History     CSN: [id number]  Arrival date & time 03/15/12  0040   First MD Initiated Contact with Patient 03/15/12 0041      Chief Complaint  Patient presents with  . Shortness of Breath    (Consider location/radiation/quality/duration/timing/severity/associated sxs/prior treatment) Patient is a 66 y.o. female presenting with shortness of breath. The history is provided by the patient. No language interpreter was used.  Shortness of Breath  The current episode started today. The onset was sudden. The problem occurs continuously. The problem has been resolved. The problem is moderate. The symptoms are relieved by nothing. The symptoms are aggravated by nothing. Associated symptoms include shortness of breath. Pertinent negatives include no chest pain, no chest pressure, no orthopnea, no fever, no rhinorrhea, no sore throat, no stridor, no cough and no wheezing. There was no intake of a foreign body. She has not inhaled smoke recently. She has been behaving normally. Urine output has been normal. There were no sick contacts. She has received no recent medical care.  Also had leg cramping and BP was reportedly elevated in the 200s systolic on EMS arrival and then came down into the 140s  Past Medical History  Diagnosis Date  . Hx of migraines   . Hypertension   . GERD (gastroesophageal reflux disease)     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  . IBS (irritable bowel syndrome)     diarrhea predom    Past Surgical History  Procedure Date  . Appendectomy 1958  . Tonsillectomy 1960  . Maxilofacial 1992  . Cystocele repair 2008    Family History  Problem Relation Age of Onset  . Hypertension Mother     parent not sure which 1  . Stroke Mother     parent not sure which 1    History  Substance Use Topics  . Smoking status: Never Smoker   . Smokeless tobacco: Not on file  . Alcohol Use: No    OB History    Grav Para Term Preterm Abortions TAB SAB Ect Mult Living         Review of Systems  Constitutional: Negative for fever.  HENT: Negative for sore throat and rhinorrhea.   Respiratory: Positive for shortness of breath. Negative for cough, wheezing and stridor.   Cardiovascular: Negative for chest pain, palpitations, orthopnea and leg swelling.  All other systems reviewed and are negative.    Allergies  Review of patient's allergies indicates no known allergies.  Home Medications   Current Outpatient Rx  Name Route Sig Dispense Refill  . ASPIRIN 81 MG PO TABS Oral Take 81 mg by mouth 3 (three) times a week. Monday, Wednesday, and Fridays    . METOPROLOL SUCCINATE ER 50 MG PO TB24 Oral Take 1 tablet (50 mg total) by mouth daily. 90 tablet 3  . CITRACAL PLUS PO TABS Oral Take by mouth daily.      Marland Kitchen ONE-DAILY MULTI VITAMINS PO TABS Oral Take 1 tablet by mouth daily.        BP 136/80  Pulse 92  Temp(Src) 98.5 F (36.9 C) (Oral)  Resp 16  SpO2 95%  Physical Exam  Constitutional: She is oriented to person, place, and time. She appears well-developed and well-nourished. No distress.  HENT:  Head: Normocephalic and atraumatic.  Mouth/Throat: Oropharynx is clear and moist.  Eyes: Conjunctivae are normal. Pupils are equal, round, and reactive to light.  Neck: Normal range of motion. Neck supple. No tracheal deviation present.  Cardiovascular: Normal rate  and regular rhythm.   Pulmonary/Chest: Effort normal and breath sounds normal. No respiratory distress. She has no wheezes. She has no rales.  Abdominal: Soft. Bowel sounds are normal. There is no tenderness. There is no rebound and no guarding.  Musculoskeletal: Normal range of motion. She exhibits no edema and no tenderness.  Lymphadenopathy:    She has no cervical adenopathy.  Neurological: She is alert and oriented to person, place, and time. She has normal reflexes.  Skin: Skin is warm and dry.  Psychiatric: She has a normal mood and affect.    ED Course  Procedures (including  critical care time)  Labs Reviewed  POCT I-STAT, CHEM 8 - Abnormal; Notable for the following:    Glucose, Bld 122 (*)    Hemoglobin 15.3 (*)    All other components within normal limits  POCT I-STAT, CHEM 8 - Abnormal; Notable for the following:    Hemoglobin 15.3 (*)    All other components within normal limits  CBC  DIFFERENTIAL  POCT I-STAT TROPONIN I  D-DIMER, QUANTITATIVE  POCT I-STAT TROPONIN I   Dg Chest 2 View  03/15/2012  *RADIOLOGY REPORT*  Clinical Data: Shortness of breath  CHEST - 2 VIEW  Comparison: None.  Findings: Minimal left costophrenic angle curvilinear atelectasis. The heart size is normal.  Lungs are otherwise clear.  No pleural effusion.  No acute osseous finding.  IMPRESSION: No acute cardiopulmonary process.  Minimal left lower lobe atelectasis.  Original Report Authenticated By: Harrel Lemon, M.D.     1. Hypertension   2. Leg pain       MDM   Date: 03/15/2012  Rate: 92  Rhythm: normal sinus rhythm  QRS Axis: normal  Intervals: PR prolonged  ST/T Wave abnormalities: normal  Conduction Disutrbances:first-degree A-V block   Narrative Interpretation: and PVC  Old EKG Reviewed: none available    Patient and family informed to follow up in am with Dr. Felicity Coyer and to return immediately for chest pain shortness of breath, sweatiness or any concerns.  Patient and family verbalize understanding and agree to follow up     Itzel Lowrimore Smitty Cords, MD 03/15/12 [phone]

## 2012-03-15 NOTE — ED Notes (Signed)
PER EMS- Pt woke up from sleeping with sudden onset shortness of breath. Presented similar to anxiety attack. Pt c/o bilateral leg pain, felt like cramping. SOB resolved with EMS. Pt alertx4, NAD. Vitals Stable, pt is hypertensive.

## 2012-03-17 ENCOUNTER — Ambulatory Visit (INDEPENDENT_AMBULATORY_CARE_PROVIDER_SITE_OTHER): Payer: Medicare Other | Admitting: Internal Medicine

## 2012-03-17 ENCOUNTER — Encounter: Payer: Self-pay | Admitting: Internal Medicine

## 2012-03-17 VITALS — BP 136/82 | HR 56 | Temp 97.6°F | Resp 14 | Wt 142.5 lb

## 2012-03-17 DIAGNOSIS — K589 Irritable bowel syndrome without diarrhea: Secondary | ICD-10-CM

## 2012-03-17 DIAGNOSIS — I10 Essential (primary) hypertension: Secondary | ICD-10-CM

## 2012-03-17 MED ORDER — CLONIDINE HCL 0.1 MG PO TABS: ORAL_TABLET | ORAL | Status: DC

## 2012-03-17 MED ORDER — HYOSCYAMINE SULFATE 0.125 MG PO TABS: 0.1250 mg | ORAL_TABLET | ORAL | Status: DC | PRN

## 2012-03-17 NOTE — Assessment & Plan Note (Signed)
Doing well - changed Bbloc to 24h XR in 03/2011 at pt request to improve compliance - Also exacerbated by pain episodes - ER visit 4/13 reviewed for same - Continue clonidine prn SBP>160 as previously prescribed The current medical regimen is effective;  continue present plan and medications. BP Readings from Last 3 Encounters:  03/17/12 136/82  03/15/12 136/80  01/14/12 132/70

## 2012-03-17 NOTE — Assessment & Plan Note (Signed)
Long hx same with abdominal pain, diarrhea predominate GI eval 09/2010 for same ([facility]) - also CT a/p 12/2010 unremarkable Celiac testing negative Temporary relief with levbid - continue same prn Pt also concerned with need for ?SB eval - will refer/defer to GI on same (pt requests change to [facility] - i will ask)

## 2012-03-17 NOTE — Patient Instructions (Signed)
It was good to see you today. We have reviewed your ER records including labs and tests today Medications reviewed, no changes at this time. Refill on medication(s) as discussed today.  Don't forget to take Toprol every day! we'll make referral to Dr. Leone Payor as requested . Our office will contact you regarding appointment(s) once made.

## 2012-03-17 NOTE — Progress Notes (Signed)
Subjective:    Patient ID: [name] [name], female    DOB: June 14, 1946, 66 y.o.   MRN: [id number]  HPI Here for ER follow up on HTN spells Pt feels variability in BP readings are induced by abdominal pain reports elevated BP preceeds diarrhea and cramping  Also reviewed chronic medical issues:  IBS -diarrhea predominate Exacerbation of symptoms with stress: severe cramping, out of Levsin (previously improved pain with antispasmodic) careful attention to diet ?need for SB eval per pt  GERD - improved with intermittent Prevacid use since 12/2010 prior EGD for same 2008 and again EGD 10/2010: no ulcers, s/p esoph dialtion of distal ring  not improved symptoms on omeprazole or nexium  s/p cardiac eval 06/2009 for same pain- echo and nuc stress neg CT a/p 12/2010 - unremarkable -   osteopenia - previously prescribed bisphosphonates (aldentronate weekly) starting 2004 - 08/2010 (>7y) pt stopped mid 08/2010 due to stomach and esophageal stricture symptoms  - no history fracture, no bone pain  hypertension - reports compliance with ongoing medical treatment and since change in medication to XR toprol (qd) 12/2010. denies adverse side effects related to current therapy.  Generalized osteoarthritis - affects knees, hands, back. Uses OTC meds for same as mobic ineffective - reports compliance with ongoing medical treatment and no changes in medication dose or frequency. denies adverse side effects related to current therapy. No weakness or falls  Past Medical History  Diagnosis Date  . Migraine   . Hypertension   . GERD (gastroesophageal reflux disease)     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  . IBS (irritable bowel syndrome)     diarrhea predom   Review of Systems  Constitutional: Negative for fever and unexpected weight change.  Respiratory: Negative for cough and shortness of breath.   Cardiovascular: Negative for chest pain, palpitations and leg swelling.  Gastrointestinal: Positive  for abdominal pain and diarrhea. Negative for constipation, blood in stool and anal bleeding.  Neurological: Negative for weakness.      Objective:   Physical Exam  BP 136/82  Pulse 56  Temp(Src) 97.6 F (36.4 C) (Oral)  Resp 14  Wt 142 lb 8 oz (64.638 kg)  SpO2 97% Wt Readings from Last 3 Encounters:  03/17/12 142 lb 8 oz (64.638 kg)  01/14/12 145 lb 12.8 oz (66.134 kg)  07/04/11 145 lb 3.2 oz (65.862 kg)   Constitutional: She appears well-developed and well-nourished. No distress. Cardiovascular: Normal rate, regular rhythm and normal heart sounds.  No murmur heard. no BLE edema Pulmonary/Chest: Effort normal and breath sounds normal. No respiratory distress. She has no wheezes.  Abdominal: Soft. Bowel sounds are normal. She exhibits no distension. There is no tenderness. Psychiatric: She has a normal mood and affect. Her behavior is normal. Judgment and thought content normal.   Lab Results  Component Value Date   WBC 8.4 03/15/2012   HGB 15.3* 03/15/2012   HCT 45.0 03/15/2012   PLT 212 03/15/2012   CHOL 190 01/21/2012   TRIG 151.0* 01/21/2012   HDL 48.70 01/21/2012   ALT 16 01/21/2012   AST 23 01/21/2012   NA 143 03/15/2012   K 3.6 03/15/2012   CL 110 03/15/2012   CREATININE 1.00 03/15/2012   BUN 20 03/15/2012   CO2 28 01/21/2012   TSH 0.77 01/21/2012        Assessment & Plan:  Time spent with pt today 25 minutes, greater than 50% time spent counseling patient on ER visit, hypertension and IBS management including abdominal pain  and pt desire for SB evaluation.  Also see problem list. Medications and labs reviewed today.

## 2012-03-19 ENCOUNTER — Ambulatory Visit (INDEPENDENT_AMBULATORY_CARE_PROVIDER_SITE_OTHER): Payer: Medicare Other | Admitting: Physician Assistant

## 2012-03-19 ENCOUNTER — Ambulatory Visit: Payer: Medicare Other

## 2012-03-19 ENCOUNTER — Telehealth: Payer: Self-pay | Admitting: Gastroenterology

## 2012-03-19 ENCOUNTER — Encounter: Payer: Self-pay | Admitting: Physician Assistant

## 2012-03-19 VITALS — BP 124/68 | HR 88 | Ht 62.0 in | Wt 143.0 lb

## 2012-03-19 DIAGNOSIS — R109 Unspecified abdominal pain: Secondary | ICD-10-CM

## 2012-03-19 LAB — HEPATIC FUNCTION PANEL
Alkaline Phosphatase: 56 U/L (ref 39–117)
Bilirubin, Direct: 0.1 mg/dL (ref 0.0–0.3)
Total Bilirubin: 0.4 mg/dL (ref 0.3–1.2)
Total Protein: 6.8 g/dL (ref 6.0–8.3)

## 2012-03-19 NOTE — Telephone Encounter (Signed)
Pt reports she can't wait until her appt with Dr Jarold Motto in mid May. Her pain is above the waist and goes from the right side to the left side or left to right. In the past she took omeprazole and nexium, but stopped. Pt given an appt with Mike Gip, PA.

## 2012-03-19 NOTE — Progress Notes (Addendum)
Subjective:    Patient ID: [name] [name], female    DOB: 09/26/1946, 66 y.o.   MRN: [id number]  HPI [name] [name] is a pleasant 66 year old female known to Dr. Jarold [name] who was last seen in December 2011 when she had an upper endoscopy. At that time she was noted to have a small hiatal hernia Schatzki's ring which was dilated. She was biopsy for H. pylori and this was negative. She has also had prior endoscopic evaluation in [place] with an upper endoscopy and colonoscopy done in December of 2008. Endoscopy at that time showed mild erosive esophagitis and a small hiatal hernia her colonoscopy was normal with internal hemorrhoids noted.  Patient comes in today stating that she had been maintained on PPI therapy for the past couple of years but had not taken any over the past 3 months because she had been doing well for quite some time. She does take occasional NSAIDs in the form of Advil for headaches and also has a butalbital/ aspirin combination products she uses for migraines periodically. She says that she had onset 5 days ago with abdominal pain that has been fairly constant. She says her pain is primarily in her epigastrium and is worse immediately after eating. She describes it as a burning inflamed heavy type of feeling. This has been associated with nausea but no vomiting. She does have some early satiety symptoms. She has not had any fever or chills. She says her bowel movements are sometimes constipated another times loose, this week she has had looser stools with 2-3 bowel movements per day. She says she had a black stool last weekend but none since. She does have a Levsin to use as needed for abdominal pain and cramping and has been taking at this week which she feels does help.  Patient had actually gone to the emergency room last weekend with an unusual episode but she says woke her from sleep with leg pain and then developed shortness of breath. She had evaluation in the emergency room and ruled  out for any cardio pulmonary acute abnormality and was discharged home. She has seen Dr. Konrad [name] and followup since and no changes were made to her regimen.  Chest x-ray was done during ER visit and this was negative. CBC was also unremarkable.    Review of Systems  Constitutional: Positive for appetite change.  HENT: Negative.   Eyes: Negative.   Respiratory: Negative.   Cardiovascular: Negative.   Gastrointestinal: Positive for nausea and abdominal pain.  Genitourinary: Negative.   Musculoskeletal: Negative.   Neurological: Negative.   Hematological: Negative.   Psychiatric/Behavioral: Negative.    Outpatient Prescriptions Prior to Visit  Medication Sig Dispense Refill  . aspirin 81 MG tablet Take 81 mg by mouth 3 (three) times a week. Monday, Wednesday, and Fridays      . Butalbital-APAP-Caffeine 50-325-40 MG per capsule Take 1 capsule by mouth every 4 (four) hours as needed for pain or headache.  30 capsule  0  . cloNIDine (CATAPRES) 0.1 MG tablet 0.1 mg tab by mouth as needed for blood pressure over 160/90; may repeat in 1 hour if needed  30 tablet  3  . hyoscyamine (LEVSIN, ANASPAZ) 0.125 MG tablet Take 1 tablet (0.125 mg total) by mouth every 4 (four) hours as needed for cramping or diarrhea or loose stools.  60 tablet  1  . metoprolol (TOPROL XL) 50 MG 24 hr tablet Take 1 tablet (50 mg total) by mouth daily.  90 tablet  3  .  Multiple Minerals-Vitamins (CITRACAL PLUS) TABS Take by mouth daily.        . Multiple Vitamin (MULTIVITAMIN) tablet Take 1 tablet by mouth daily.         No Known Allergies Patient Active Problem List  Diagnoses  . MIGRAINE HEADACHE  . HYPERTENSION  . GERD  . OSTEOARTHRITIS  . OSTEOPENIA  . HEADACHE  . OTHER DYSPHAGIA  . RENAL CALCULUS, HX OF  . IBS (irritable bowel syndrome)       Objective:   Physical Exam well-developed [name] in no acute distress. Accompanied by her husband. Blood pressure 124/68 pulse 88. HEENT; not microcephalic  EOMI PERRLA sclera anicteric, Supple no JVD, Cardiovascular; regular rate and rhythm with S1-S2 no murmur gallop, Pulmonary; clear bilaterally, Abdomen ;soft bowel sounds are active she is tender in the epigastrium and all across the upper abdomen is very mild tenderness in the right lower quadrant there is no guarding no rebound no palpable mass or hepatosplenomegaly. She also does have some tenderness bilaterally at the anterior rib margins. Rectal; Brown Hemoccult-negative stool, Extremities ;no clubbing cyanosis or edema skin warm and dry, Psych ;mood and affect normal and appropriate.        Assessment & Plan:  #57 66 year old female with 5 day history of upper abdominal pain associated with nausea and postprandial exacerbation. I suspect she may have a gastritis or aspirin/NSAID-induced peptic ulcer disease. Will rule out gallbladder disease. Patient also carries a diagnosis of IBS her symptoms may be more functional. Plan; start Nexium 40 mg by mouth twice daily x2 weeks, patient was given samples today. If this is helpful she will need a prescription for Nexium 40 mg once daily. I have asked her to stop using NSAIDs and her butalbital aspirin combination for migraine and use Tylenol instead. Soft bland, small frequent meals Continue Levsin when necessary abdominal cramping Schedule upper abdominal ultrasound Check hepatic panel and lipase today, CBC earlier in the week was unremarkable. Further plans the results of ultrasound and her response to empiric trial of Nexium.  Addendum: Reviewed and agree with management. If [place] unrevealing and no response to PPI, would consider repeat EGD. Carie Caddy. Pyrtle, M.D.  03/19/2012

## 2012-03-19 NOTE — Patient Instructions (Signed)
You will go to the basement for labs today You have been scheduled for an Abdominal Ultrasound on 03/22/2012 at 10 am At [facility] Radiology 1st floor, please arrive 15 minutes before your appointment Nothing to eat or drink after midnight the night before your test  We have given you Nexium samples today, your will take one twice a day until all your samples run out, then take once daily If the Nexium has worked for you please contact the office to get a prescription

## 2012-03-22 ENCOUNTER — Ambulatory Visit (HOSPITAL_COMMUNITY)
Admission: RE | Admit: 2012-03-22 | Discharge: 2012-03-22 | Disposition: A | Discharge status: Home / Self Care | Payer: Medicare Other | Source: Ambulatory Visit | Attending: Physician Assistant | Admitting: Physician Assistant

## 2012-03-22 DIAGNOSIS — K7689 Other specified diseases of liver: Secondary | ICD-10-CM | POA: Insufficient documentation

## 2012-03-22 DIAGNOSIS — R109 Unspecified abdominal pain: Secondary | ICD-10-CM

## 2012-03-22 DIAGNOSIS — Q619 Cystic kidney disease, unspecified: Secondary | ICD-10-CM | POA: Insufficient documentation

## 2012-03-22 DIAGNOSIS — R1013 Epigastric pain: Secondary | ICD-10-CM | POA: Insufficient documentation

## 2012-03-30 ENCOUNTER — Other Ambulatory Visit: Payer: Self-pay | Admitting: Internal Medicine

## 2012-03-31 ENCOUNTER — Encounter: Payer: Self-pay | Admitting: *Deleted

## 2012-04-06 ENCOUNTER — Encounter: Payer: Self-pay | Admitting: Gastroenterology

## 2012-04-06 ENCOUNTER — Ambulatory Visit (INDEPENDENT_AMBULATORY_CARE_PROVIDER_SITE_OTHER): Payer: Medicare Other | Admitting: Gastroenterology

## 2012-04-06 DIAGNOSIS — K589 Irritable bowel syndrome without diarrhea: Secondary | ICD-10-CM

## 2012-04-06 DIAGNOSIS — R197 Diarrhea, unspecified: Secondary | ICD-10-CM

## 2012-04-06 DIAGNOSIS — R109 Unspecified abdominal pain: Secondary | ICD-10-CM

## 2012-04-06 MED ORDER — CILIDINIUM-CHLORDIAZEPOXIDE 2.5-5 MG PO CAPS: 1.0000 | ORAL_CAPSULE | Freq: Three times a day (TID) | ORAL | Status: DC | PRN

## 2012-04-06 MED ORDER — MOVIPREP 100 G PO SOLR: 1.0000 | Freq: Once | ORAL | Status: DC

## 2012-04-06 NOTE — Patient Instructions (Signed)
Your procedure has been scheduled for 04/12/2012, please follow the seperate instructions.  Your prescription(s) have been sent to you pharmacy, Movi Prep and Librax.  Take Librax as needed for abdominal cramping.

## 2012-04-06 NOTE — Progress Notes (Signed)
This is a 66 year old Caucasian female who has chronic abdominal pain with gas and bloating, primary constipation, and rather persistent epigastric discomfort of unexplained etiology. She's had previous workups in [place] 5 years ago with negative endoscopy and colonoscopy. Endoscopy here 2 years ago was unremarkable including examination or H. pylori. Treatment with PPI therapy has not improved her persistent epigastric discomfort which does not radiate, and is not associated with hepatobiliary complaints. She has constipation predominant IBS but denies melena or hematochezia. She sees previous Levsin with good improvement. The patient is under a lot of personal stress which seemed to exacerbate her gut problems. She denies abuse of alcohol, cigarettes, or NSAIDs. There is been no anorexia or weight loss. Recent upper abdominal ultrasound exam and labs were all unremarkable. This includes CBC and liver function tests.  Current Medications, Allergies, Past Medical History, Past Surgical History, Family History and Social History were reviewed in [employer] record.  Pertinent Review of Systems Negative   Physical Exam: Healthy-appearing patient in no acute distress. Blood pressure 132/64, pulse 58, weight 142 pounds with a BMI of 25.15. I cannot appreciate stigmata of chronic liver disease. Chest is clear she is in a regular rhythm without murmurs gallops or rubs. There is no organomegaly, abdominal masses or tenderness. Bowel sounds are normal. Rectal exam is deferred. Mental status is normal. Peripheral extremities are unremarkable.  Assessment and plan: Probable symptomatic diverticulosis with constipation predominant IBS. I have asked to take daily Metamucil with a high fiber diet and liberal by mouth fluids. She does need followup colonoscopy and endoscopy with propofol sedation. I have placed her on Librax every 6-8 hours on a when necessary basis, and we'll continue other  medications as listed and reviewed. She apparently has a sister who has similar IBS-type complaints.     Encounter Diagnoses  Name Primary?  . Abdominal pain Yes  . Diarrhea

## 2012-04-08 ENCOUNTER — Telehealth: Payer: Self-pay | Admitting: Gastroenterology

## 2012-04-09 NOTE — Telephone Encounter (Signed)
no

## 2012-04-09 NOTE — Telephone Encounter (Signed)
Librax is not covered and she is already taking Levsin. Is there anything else you would rx?

## 2012-04-09 NOTE — Telephone Encounter (Signed)
pts husband advised

## 2012-04-12 ENCOUNTER — Encounter: Payer: Self-pay | Admitting: Gastroenterology

## 2012-04-12 ENCOUNTER — Other Ambulatory Visit: Payer: Self-pay | Admitting: Gastroenterology

## 2012-04-12 ENCOUNTER — Ambulatory Visit (AMBULATORY_SURGERY_CENTER): Payer: Medicare Other | Admitting: Gastroenterology

## 2012-04-12 VITALS — BP 158/87 | HR 86 | Temp 96.3°F | Resp 22 | Ht 63.0 in | Wt 142.0 lb

## 2012-04-12 DIAGNOSIS — R197 Diarrhea, unspecified: Secondary | ICD-10-CM

## 2012-04-12 DIAGNOSIS — R109 Unspecified abdominal pain: Secondary | ICD-10-CM

## 2012-04-12 DIAGNOSIS — K5730 Diverticulosis of large intestine without perforation or abscess without bleeding: Secondary | ICD-10-CM

## 2012-04-12 DIAGNOSIS — K219 Gastro-esophageal reflux disease without esophagitis: Secondary | ICD-10-CM

## 2012-04-12 DIAGNOSIS — Z1211 Encounter for screening for malignant neoplasm of colon: Secondary | ICD-10-CM

## 2012-04-12 DIAGNOSIS — K589 Irritable bowel syndrome without diarrhea: Secondary | ICD-10-CM

## 2012-04-12 MED ORDER — DEXLANSOPRAZOLE 60 MG PO CPDR: 60.0000 mg | DELAYED_RELEASE_CAPSULE | Freq: Every day | ORAL | Status: DC

## 2012-04-12 MED ORDER — SODIUM CHLORIDE 0.9 % IV SOLN: 500.0000 mL | INTRAVENOUS | Status: DC

## 2012-04-12 NOTE — Op Note (Signed)
[facility] [address] [place], [place]  [address]  COLONOSCOPY PROCEDURE REPORT  PATIENT:  [name], [name]  MR#:  [id number] BIRTHDATE:  12/27/1945, 65 yrs. old  GENDER:  female ENDOSCOPIST:  Vania Rea. Jarold Motto, MD, [facility] REF. BY:  Rene Paci, M.D. PROCEDURE DATE:  04/12/2012 PROCEDURE:  Average-risk screening colonoscopy G0121 ASA CLASS:  Class II INDICATIONS:  Abdominal pain, Colorectal cancer screening, average risk MEDICATIONS:   propofol (Diprivan) 200 mg IV  DESCRIPTION OF PROCEDURE:   After the risks and benefits and of the procedure were explained, informed consent was obtained. Digital rectal exam was performed and revealed no abnormalities. The LB CF-[id number] endoscope was introduced through the anus and advanced to the cecum, which was identified by both the appendix and ileocecal valve.  The quality of the prep was excellent, using MoviPrep.  The instrument was then slowly withdrawn as the colon was fully examined. <<PROCEDUREIMAGES>>  FINDINGS:  Scattered diverticula were found in the sigmoid to descending colon segments.  No polyps or cancers were seen.  This was otherwise a normal examination of the colon.   Retroflexed views in the rectum revealed it was not tolerated by the patient. The scope was then withdrawn from the patient and the procedure completed.  COMPLICATIONS:  None ENDOSCOPIC IMPRESSION: 1) Diverticula, scattered in the sigmoid to descending colon segments 2) No polyps or cancers 3) Otherwise normal examination 4) It was not tolerated by the patient. RECOMMENDATIONS: 1) High fiber diet. 2) Titrate to need 3) Repeat colonoscopy 10 years.  REPEAT EXAM:  No  ______________________________ Vania Rea. Jarold Motto, MD, [name]  CC:  n. eSIGNED:   Vania Rea. Jaequan Propes at 04/12/2012 03:37 PM  Murlean Iba, [phone]

## 2012-04-12 NOTE — Op Note (Signed)
 [facility] [address] [place], [place]  [address]  ENDOSCOPY PROCEDURE REPORT  PATIENT:  [name], [name]  MR#:  [id number] BIRTHDATE:  1946/11/06, 65 yrs. old  GENDER:  female  ENDOSCOPIST:  Vania Rea. Jarold Motto, MD, [facility] Referred by:  PROCEDURE DATE:  04/12/2012 PROCEDURE:  EGD with biopsy for H. pylori 19147 ASA CLASS:  Class II INDICATIONS:  abdominal pain, GERD  MEDICATIONS:   There was residual sedation effect present from prior procedure., propofol (Diprivan) 100 mg IV TOPICAL ANESTHETIC:  DESCRIPTION OF PROCEDURE:   After the risks and benefits of the procedure were explained, informed consent was obtained.  The LB GIF-H180 [id number] endoscope was introduced through the mouth and advanced to the second portion of the duodenum.  The instrument was slowly withdrawn as the mucosa was fully examined. <<PROCEDUREIMAGES>>  A hiatal hernia was found. 3 CM HH NOTED.SEE PICTURES.NO STRICTURE NOTED  Moderate gastritis was found in the antrum. CLO BX. DONE. Normal duodenal folds were noted.  The esophagus and gastroesophageal junction were completely normal in appearance. Retroflexed views revealed a hiatal hernia.    The scope was then withdrawn from the patient and the procedure completed.  COMPLICATIONS:  None  ENDOSCOPIC IMPRESSION: 1) Hiatal hernia 2) Moderate gastritis in the antrum 3) Normal duodenal folds 4) Normal esophagus 5) A hiatal hernia 1.HIATIAL HERNIA AND CHRONIC GERD 2.R/O H.PYLORI GASTRITIS RECOMMENDATIONS: 1) Await biopsy results 2) Rx CLO if positive 3) continue PPI 4) continue current medications  ______________________________ Vania Rea. Jarold Motto, MD, [name]  CC:  Newt Lukes, MD  n. Rosalie DoctorMarland Kitchen   Vania Rea. Len Azeez at 04/12/2012 03:42 PM  Murlean Iba, [phone]

## 2012-04-12 NOTE — Patient Instructions (Signed)
Discharge instructions given with verbal understanding. Handouts on diverticulosis,hiatal hernia,gastritis, and a high fiber diet given. Resume previous medications.YOU HAD AN ENDOSCOPIC PROCEDURE TODAY AT THE [facility]: Refer to the procedure report that was given to you for any specific questions about what was found during the examination.  If the procedure report does not answer your questions, please call your gastroenterologist to clarify.  If you requested that your care partner not be given the details of your procedure findings, then the procedure report has been included in a sealed envelope for you to review at your convenience later.  YOU SHOULD EXPECT: Some feelings of bloating in the abdomen. Passage of more gas than usual.  Walking can help get rid of the air that was put into your GI tract during the procedure and reduce the bloating. If you had a lower endoscopy (such as a colonoscopy or flexible sigmoidoscopy) you may notice spotting of blood in your stool or on the toilet paper. If you underwent a bowel prep for your procedure, then you may not have a normal bowel movement for a few days.  DIET: Your first meal following the procedure should be a light meal and then it is ok to progress to your normal diet.  A half-sandwich or bowl of soup is an example of a good first meal.  Heavy or fried foods are harder to digest and may make you feel nauseous or bloated.  Likewise meals heavy in dairy and vegetables can cause extra gas to form and this can also increase the bloating.  Drink plenty of fluids but you should avoid alcoholic beverages for 24 hours.  ACTIVITY: Your care partner should take you home directly after the procedure.  You should plan to take it easy, moving slowly for the rest of the day.  You can resume normal activity the day after the procedure however you should NOT DRIVE or use heavy machinery for 24 hours (because of the sedation medicines used during the  test).    SYMPTOMS TO REPORT IMMEDIATELY: A gastroenterologist can be reached at any hour.  During normal business hours, 8:30 AM to 5:00 PM Monday through Friday, call [phone].  After hours and on weekends, please call the GI answering service at [phone] who will take a message and have the physician on call contact you.   Following lower endoscopy (colonoscopy or flexible sigmoidoscopy):  Excessive amounts of blood in the stool  Significant tenderness or worsening of abdominal pains  Swelling of the abdomen that is new, acute  Fever of 100F or higher  Following upper endoscopy (EGD)  Vomiting of blood or coffee ground material  New chest pain or pain under the shoulder blades  Painful or persistently difficult swallowing  New shortness of breath  Fever of 100F or higher  Black, tarry-looking stools  FOLLOW UP: If any biopsies were taken you will be contacted by phone or by letter within the next 1-3 weeks.  Call your gastroenterologist if you have not heard about the biopsies in 3 weeks.  Our staff will call the home number listed on your records the next business day following your procedure to check on you and address any questions or concerns that you may have at that time regarding the information given to you following your procedure. This is a courtesy call and so if there is no answer at the home number and we have not heard from you through the emergency physician on call, we will  assume that you have returned to your regular daily activities without incident.  SIGNATURES/CONFIDENTIALITY: You and/or your care partner have signed paperwork which will be entered into your electronic medical record.  These signatures attest to the fact that that the information above on your After Visit Summary has been reviewed and is understood.  Full responsibility of the confidentiality of this discharge information lies with you and/or your care-partner.

## 2012-04-12 NOTE — Progress Notes (Signed)
Propofol per [facility] crna. See scanned intra procedure report. ewm 

## 2012-04-12 NOTE — Progress Notes (Signed)
Patient did not experience any of the following events: a burn prior to discharge; a fall within the facility; wrong site/side/patient/procedure/implant event; or a hospital transfer or hospital admission upon discharge from the facility. (G8907) Patient did not have preoperative order for IV antibiotic SSI prophylaxis. (G8918)  

## 2012-04-13 ENCOUNTER — Telehealth: Payer: Self-pay

## 2012-04-13 NOTE — Telephone Encounter (Signed)
Left message on answering machine. 

## 2012-04-14 LAB — HELICOBACTER PYLORI SCREEN-BIOPSY: UREASE: NEGATIVE

## 2012-04-16 ENCOUNTER — Encounter: Payer: Self-pay | Admitting: Gastroenterology

## 2012-04-26 ENCOUNTER — Encounter: Payer: Self-pay | Admitting: Endocrinology

## 2012-04-26 ENCOUNTER — Ambulatory Visit (INDEPENDENT_AMBULATORY_CARE_PROVIDER_SITE_OTHER): Payer: Medicare Other | Admitting: Endocrinology

## 2012-04-26 VITALS — BP 150/88 | HR 61 | Temp 98.1°F | Ht 62.0 in | Wt 142.0 lb

## 2012-04-26 DIAGNOSIS — G43909 Migraine, unspecified, not intractable, without status migrainosus: Secondary | ICD-10-CM

## 2012-04-26 MED ORDER — HYDROCODONE-ACETAMINOPHEN 5-325 MG PO TABS: 1.0000 | ORAL_TABLET | Freq: Four times a day (QID) | ORAL | Status: AC | PRN

## 2012-04-26 NOTE — Progress Notes (Signed)
Subjective:    Patient ID: [name] [name], female    DOB: 1946-05-01, 66 y.o.   MRN: [id number]  HPI Pt states 3 weeks of moderate headache, throughout the head, but no assoc n/v. Past Medical History  Diagnosis Date  . Migraine   . Hypertension   . GERD (gastroesophageal reflux disease)     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  . IBS (irritable bowel syndrome)     diarrhea predom  . Hypertension   . Hiatal hernia   . Schatzki's ring   . Kidney stones     hx of   . Hemorrhoids     Past Surgical History  Procedure Date  . Appendectomy 1958  . Tonsillectomy 1960  . Maxilofacial 1992  . Cystocele repair 2008    History   Social History  . Marital Status: Married    Spouse Name: N/A    Number of Children: N/A  . Years of Education: N/A   Occupational History  . Not on file.   Social History Main Topics  . Smoking status: Never Smoker   . Smokeless tobacco: Never Used  . Alcohol Use: No  . Drug Use: No  . Sexually Active: Not on file   Other Topics Concern  . Not on file   Social History Narrative   Married, lives with spouse. Retired Comptroller, Manufacturing systems engineer. Has 3 kids (moved to [place] to be closer)- youngest son MD. [place] Ham to [place] from [place] 06/2010, lived in [place] x 10years    Current Outpatient Prescriptions on File Prior to Visit  Medication Sig Dispense Refill  . aspirin 81 MG tablet Take 81 mg by mouth 3 (three) times a week. Monday, Wednesday, and Fridays      . cloNIDine (CATAPRES) 0.1 MG tablet 0.1 mg tab by mouth as needed for blood pressure over 160/90; may repeat in 1 hour if needed  30 tablet  3  . dexlansoprazole (DEXILANT) 60 MG capsule Take 1 capsule (60 mg total) by mouth daily.  30 capsule  6  . hyoscyamine (LEVSIN, ANASPAZ) 0.125 MG tablet Take 1 tablet (0.125 mg total) by mouth every 4 (four) hours as needed for cramping or diarrhea or loose stools.  60 tablet  1  . metoprolol (TOPROL XL) 50 MG 24 hr tablet Take 1 tablet (50 mg total) by  mouth daily.  90 tablet  3  . Multiple Minerals-Vitamins (CITRACAL PLUS) TABS Take by mouth daily.        . Multiple Vitamin (MULTIVITAMIN) tablet Take 1 tablet by mouth daily.        . Butalbital-APAP-Caffeine 50-325-40 MG per capsule Take 1 capsule by mouth every 4 (four) hours as needed for pain or headache.  30 capsule  0  . clidinium-chlordiazePOXIDE (LIBRAX) 2.5-5 MG per capsule Take 1 capsule by mouth 3 (three) times daily as needed.  90 capsule  3    No Known Allergies  Family History  Problem Relation Age of Onset  . Hypertension Mother   . Stroke Mother   . Colon cancer Neg Hx     BP 150/88  Pulse 61  Temp(Src) 98.1 F (36.7 C) (Oral)  Ht 5\' 2"  (1.575 m)  Wt 142 lb (64.411 kg)  BMI 25.97 kg/m2  SpO2 97%  Review of Systems Denies visual loss and LOC    Objective:   Physical Exam VITAL SIGNS:  See vs page GENERAL: no distress head: no deformity eyes: no periorbital swelling, no proptosis external nose and ears are normal  mouth: no lesion seen Neck: supple    Assessment & Plan:  Headache, new, uncertain etiology HTN, with probable situational component.

## 2012-04-26 NOTE — Patient Instructions (Addendum)
Refer to a headache specialist.  you will receive a phone call, about a day and time for an appointment In the meantime, here is a prescription for the headache.   Please have your blood pressure rechecked here in 1-2 weeks.  Please call ahead.

## 2012-05-03 ENCOUNTER — Other Ambulatory Visit: Payer: Self-pay | Admitting: Internal Medicine

## 2012-05-21 ENCOUNTER — Ambulatory Visit: Payer: Medicare Other | Admitting: Gastroenterology

## 2012-07-14 ENCOUNTER — Ambulatory Visit: Payer: Medicare Other | Admitting: Internal Medicine

## 2012-07-20 ENCOUNTER — Other Ambulatory Visit: Payer: Self-pay | Admitting: Internal Medicine

## 2012-07-29 ENCOUNTER — Telehealth: Payer: Self-pay | Admitting: Gastroenterology

## 2012-07-29 ENCOUNTER — Ambulatory Visit: Payer: Medicare Other | Admitting: Internal Medicine

## 2012-07-29 NOTE — Telephone Encounter (Signed)
Message copied by Arna Snipe on Thu Jul 29, 2012 10:16 AM ------      Message from: Richardson Chiquito      Created: Fri May 21, 2012 11:01 AM       Per provider, please bill no show fee

## 2012-08-02 ENCOUNTER — Ambulatory Visit (INDEPENDENT_AMBULATORY_CARE_PROVIDER_SITE_OTHER): Payer: Medicare Other | Admitting: Internal Medicine

## 2012-08-02 ENCOUNTER — Encounter: Payer: Self-pay | Admitting: Internal Medicine

## 2012-08-02 VITALS — BP 120/82 | HR 62 | Temp 97.6°F | Ht 62.0 in | Wt 140.1 lb

## 2012-08-02 DIAGNOSIS — K589 Irritable bowel syndrome without diarrhea: Secondary | ICD-10-CM

## 2012-08-02 DIAGNOSIS — M899 Disorder of bone, unspecified: Secondary | ICD-10-CM

## 2012-08-02 DIAGNOSIS — I10 Essential (primary) hypertension: Secondary | ICD-10-CM

## 2012-08-02 DIAGNOSIS — M949 Disorder of cartilage, unspecified: Secondary | ICD-10-CM

## 2012-08-02 DIAGNOSIS — K219 Gastro-esophageal reflux disease without esophagitis: Secondary | ICD-10-CM

## 2012-08-02 MED ORDER — LANSOPRAZOLE 30 MG PO CPDR: 30.0000 mg | DELAYED_RELEASE_CAPSULE | Freq: Every day | ORAL | Status: DC

## 2012-08-02 NOTE — Progress Notes (Signed)
Subjective:    Patient ID: [name] [name], female    DOB: [birth year], 66 y.o.   MRN: [id number]  HPI  Here for follow up -reviewed chronic medical issues:  IBS -diarrhea predominate Exacerbation of symptoms with stress: severe cramping - uses Levsin prn - also careful attention to diet  GERD - improved with intermittent Prevacid use since 12/2010 prior EGD for same 2008, 10/2010 and 03/2012: no ulcers, s/p esoph dialtion of distal ring in 2011, mod gastritis and H pylori neg not improved symptoms on omeprazole or nexium  s/p cardiac eval 06/2009 for same pain- echo and nuc stress neg CT a/p 12/2010 - unremarkable -   osteopenia - previously prescribed bisphosphonates (aldentronate weekly) starting 2004 - 08/2010 (>7y). pt stopped mid 08/2010 due to stomach and esophageal stricture symptoms  - no history fracture, no bone pain  hypertension - reports compliance with ongoing medical treatment and since change in medication to XR toprol (qd) 12/2010. denies adverse side effects related to current therapy.  Generalized osteoarthritis - affects knees, hands, back. Uses OTC meds for same as mobic ineffective - reports compliance with ongoing medical treatment and no changes in medication dose or frequency. denies adverse side effects related to current therapy. No weakness or falls  Past Medical History  Diagnosis Date  . Migraine   . Hypertension   . GERD (gastroesophageal reflux disease) 11/08/2007, 04/12/2012  . IBS (irritable bowel syndrome)     diarrhea predom  . Hiatal hernia 11/08/2007, 04/12/2012  . 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 History of kidney stones   . Gastritis 11/08/2007, 04/12/2012    H pylori bx neg 03/2012  . Internal hemorrhoids 11/08/2007  . Diverticula of colon 04/12/2012   Review of Systems  Constitutional: Negative for fever and unexpected weight change.  Respiratory: Negative for cough and shortness of breath.     Cardiovascular: Negative for chest pain, palpitations and leg swelling.  Gastrointestinal: Positive for abdominal pain and diarrhea. Negative for constipation, blood in stool and anal bleeding.  Neurological: Negative for weakness.      Objective:   Physical Exam  BP 120/82  Pulse 62  Temp 97.6 F (36.4 C) (Oral)  Ht 5\' 2"  (1.575 m)  Wt 140 lb 1.9 oz (63.558 kg)  BMI 25.63 kg/m2  SpO2 97% Wt Readings from Last 3 Encounters:  08/02/12 140 lb 1.9 oz (63.558 kg)  04/26/12 142 lb (64.411 kg)  04/12/12 142 lb (64.411 kg)   Constitutional: She appears well-developed and well-nourished. No distress. Cardiovascular: Normal rate, regular rhythm and normal heart sounds.  No murmur heard. no BLE edema Pulmonary/Chest: Effort normal and breath sounds normal. No respiratory distress. She has no wheezes.  Abdominal: Soft. Bowel sounds are normal. She exhibits no distension. There is no tenderness. Psychiatric: She has a normal mood and affect. Her behavior is normal. Judgment and thought content normal.   Lab Results  Component Value Date   WBC 8.4 03/15/2012   HGB 15.3* 03/15/2012   HCT 45.0 03/15/2012   PLT 212 03/15/2012   CHOL 190 01/21/2012   TRIG 151.0* 01/21/2012   HDL 48.70 01/21/2012   ALT 17 03/19/2012   AST 21 03/19/2012   NA 143 03/15/2012   K 3.6 03/15/2012   CL 110 03/15/2012   CREATININE 1.00 03/15/2012   BUN 20 03/15/2012   CO2 28 01/21/2012   TSH 0.77 01/21/2012        Assessment & Plan:  Time spent  with pt today 25 minutes, greater than 50% time spent counseling patient hypertension and IBS management and review of interval records  see problem list. Medications and labs reviewed today.

## 2012-08-02 NOTE — Assessment & Plan Note (Signed)
symptoms much improved on generic prevacid (prev tx nexium, dexilant and prilosec ineffective) Encouraged to continue same status post GI eval 03/2012 EGD with mod gastritis but + clo h pylori also CT a/p 12/2010 - unremarkable The current medical regimen is effective;  continue present plan and medications.  

## 2012-08-02 NOTE — Patient Instructions (Signed)
It was good to see you today. We have reviewed your prior records including labs and tests today Medications reviewed, no changes at this time. we'll make referral to nutritionist as requested . Our office will contact you regarding appointment(s) once made. Please schedule followup in 6 months, call sooner if problems.

## 2012-08-02 NOTE — Assessment & Plan Note (Signed)
Doing well - changed Bbloc to 24h XR in 03/2011 at pt request to improve compliance - Also exacerbated by pain episodes - ER visit 4/13 reviewed for same - Continue clonidine prn SBP>160 as previously prescribed The current medical regimen is effective;  continue present plan and medications. BP Readings from Last 3 Encounters:  08/02/12 120/82  04/26/12 150/88  04/12/12 158/87

## 2012-08-02 NOTE — Assessment & Plan Note (Signed)
Long hx same with abdominal pain, bloating and bowel changes -diarrhea predominate GI eval 2008, 09/2010 and 03/2012 for same ([facility]) - largely unremarkable also CT a/p 12/2010 unremarkable Celiac testing negative Temporary relief with levbid - continue same prn Consider SSRI - reviewed same with pt today who declines new med at this time

## 2012-08-02 NOTE — Assessment & Plan Note (Signed)
Previous weekly Fosamax 2004 through October 2011 - stopped same because of GI reflux symptoms and esophageal stricture Recheck DEXA 12/2011 stable - reviewed with pt today  Advised continued calcium plus vitamin D supplements and weightbearing exercise

## 2012-08-05 ENCOUNTER — Encounter: Payer: Medicare Other | Attending: Internal Medicine | Admitting: *Deleted

## 2012-08-05 ENCOUNTER — Encounter: Payer: Self-pay | Admitting: *Deleted

## 2012-08-05 VITALS — Ht 62.0 in | Wt 140.2 lb

## 2012-08-05 DIAGNOSIS — K589 Irritable bowel syndrome without diarrhea: Secondary | ICD-10-CM

## 2012-08-05 DIAGNOSIS — Z713 Dietary counseling and surveillance: Secondary | ICD-10-CM | POA: Insufficient documentation

## 2012-08-05 NOTE — Patient Instructions (Signed)
Follow IBS diet elimination high fat foods and high residue produce

## 2012-08-05 NOTE — Progress Notes (Signed)
  Medical Nutrition Therapy:  Appt start time: 0930 end time:  1030.   Assessment:  Primary concerns today: IBS.   MEDICATIONS: see list   DIETARY INTAKE:  Usual eating pattern includes 2 meals and 2 snacks per day.  Everyday foods include fresh cheese, caffeine, fruits, some vegetables.  Avoided foods include yogurt, beans, meats.    24-hr recall:  B ( AM): fruit, coffee and milk, fresh cheese, juice, wheat bread  Snk ( AM): fruit and vegetable smoothie with nuts L ( PM): chicken breast with vegetables - soup with grains or potatoes Snk ( PM): bread with cheese or fruit D ( PM): none Snk ( PM): none Beverages: water or soymilk  Usual physical activity: walk most days 30-45 min.  Plays tennis  Estimated energy needs: 1500 calories 170 g carbohydrates 112 g protein 42 g fat  Progress Towards Goal(s):  In progress.   Nutritional Diagnosis:  [place]-1.4 Altered GI function As related to IBS.  As evidenced by cramping, bloating, abdominal pain when eating.    Intervention:  Nutrition counseling provided.  Dniya is here with her husband.  She is very upset over her IBS and is having a good deal of difficulty finding foods that she feels comfortable eating.  Many foods cause GI distress. Especially high fiber vegetables adn tough meats.  Discussed limiting fats, sugars including lactose, tough meats, and tough/fibrous produce.  Suggested following elimination diet for 2 weeks: focusing on low-fat cheese, lactose-free products, and low-residue produce.  Strongly suggested change in eating pattern to 5-6 small meals rather than 2 larger meals.  Encouraged water throughout the day and physical activity daily.  Will revisit diet in 2-3 weeks and add back foods 1 at a time and gradually increase fiber content.  Na was curious about taking supplemental enzymes- referred to MD for consult.  Might also suggest probiotics- yogurt is not tolerated by patient  Handouts given during visit  include:  Medical nutrition therapy for IBS    Monitoring/Evaluation:  Dietary intake, exercise,  and body weight in 2 week(s).

## 2012-08-20 ENCOUNTER — Ambulatory Visit: Payer: Medicare Other | Admitting: *Deleted

## 2012-08-24 ENCOUNTER — Ambulatory Visit (INDEPENDENT_AMBULATORY_CARE_PROVIDER_SITE_OTHER): Payer: Medicare Other

## 2012-08-24 DIAGNOSIS — Z23 Encounter for immunization: Secondary | ICD-10-CM

## 2012-11-01 ENCOUNTER — Encounter: Payer: Self-pay | Admitting: Internal Medicine

## 2012-11-01 ENCOUNTER — Ambulatory Visit (INDEPENDENT_AMBULATORY_CARE_PROVIDER_SITE_OTHER): Payer: Medicare Other | Admitting: Internal Medicine

## 2012-11-01 VITALS — BP 120/82 | HR 69 | Temp 97.0°F | Ht 63.0 in

## 2012-11-01 DIAGNOSIS — J019 Acute sinusitis, unspecified: Secondary | ICD-10-CM

## 2012-11-01 MED ORDER — AZITHROMYCIN 250 MG PO TABS: ORAL_TABLET | ORAL | Status: DC

## 2012-11-01 MED ORDER — HYDROCODONE-HOMATROPINE 5-1.5 MG/5ML PO SYRP: 5.0000 mL | ORAL_SOLUTION | Freq: Four times a day (QID) | ORAL | Status: DC | PRN

## 2012-11-01 NOTE — Patient Instructions (Signed)
It was good to see you today. Zpak antibiotics and prescription cough syrup - Your prescription(s) have been submitted to your pharmacy. Please take as directed and contact our office if you believe you are having problem(s) with the medication(s). Ok to [employer] tylenol or AlkaSeltzer Plus as needed Hydrate, rest and call if worse or unimproved Sinusitis Sinusitis an infection of the air pockets (sinuses) in your face. This can cause puffiness (swelling). It can also cause drainage from your sinuses.   HOME CARE    Only take medicine as told by your doctor.   Drink enough fluids to keep your pee (urine) clear or pale yellow.   Apply moist heat or ice packs for pain relief.   Use salt (saline) nose sprays. The spray will wet the thick fluid in the nose. This can help the sinuses drain.  GET HELP RIGHT AWAY IF:    You have a fever.   Your baby is older than 3 months with a rectal temperature of 102 F (38.9 C) or higher.   Your baby is 36 months old or younger with a rectal temperature of 100.4 F (38 C) or higher.   The pain gets worse.   You get a very bad headache.   You keep throwing up (vomiting).   Your face gets puffy.  MAKE SURE YOU:    Understand these instructions.   Will watch your condition.   Will get help right away if you are not doing well or get worse.  Document Released: 04/28/2008 Document Revised: 02/02/2012 Document Reviewed: 04/28/2008 [facility] Patient Information 2013 [place], [place].   Salt Water Gargle This solution will help make your mouth and throat feel better. HOME CARE INSTRUCTIONS    Mix 1 teaspoon of salt in 8 ounces of warm water.   Gargle with this solution as much or often as you need or as directed. Swish and gargle gently if you have any sores or wounds in your mouth.   Do not swallow this mixture.  Document Released: 08/14/2004 Document Revised: 02/02/2012 Document Reviewed: 01/05/2009 [facility] Patient Information 2013 [place],  [place].

## 2012-11-01 NOTE — Progress Notes (Signed)
  Subjective:    HPI  complains of head cold symptoms, ?sinusitus Onset >1 week ago, initially improved then relapsing and worse symptoms  First associated with rhinorrhea, sneezing, sore throat, mild headache and low grade fever Now sinus pressure and mild-mod nasal congestion, yellow-green discharge No relief with OTC meds Precipitated by sick contacts and weather change  Past Medical History  Diagnosis Date  . Migraine   . Hypertension   . GERD (gastroesophageal reflux disease) 11/08/2007, 04/12/2012  . IBS (irritable bowel syndrome)     diarrhea predom  . Hiatal hernia 11/08/2007, 04/12/2012  . 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 History of kidney stones   . Gastritis 11/08/2007, 04/12/2012    H pylori bx neg 03/2012    Review of Systems Constitutional: No night sweats, no unexpected weight change Pulmonary: No pleurisy or hemoptysis Cardiovascular: No chest pain or palpitations     Objective:   Physical Exam BP 120/82  Pulse 69  Temp 97 F (36.1 C) (Oral)  Ht 5\' 3"  (1.6 m)  SpO2 98% GEN: mildly ill appearing and audible head/chest congestion HENT: NCAT, mild sinus tenderness bilaterally, nares with thick discharge and turbinate swelling, oropharynx mild erythema and PND, no exudate Eyes: Vision grossly intact, no conjunctivitis Lungs: Clear to auscultation without rhonchi or wheeze, no increased work of breathing Cardiovascular: Regular rate and rhythm, no bilateral edema      Assessment & Plan:  Viral URI > progression to acute sinusitis Cough, postnasal drip related to above   Empiric antibiotics prescribed due to symptom duration greater than 7 days and progression despite OTC symptomatic care Prescription cough suppression - new prescriptions done Symptomatic care with Tylenol or Advil, decongestants, antihistamine, hydration and rest -  Saline irrigation and salt gargle advised as needed

## 2013-01-31 ENCOUNTER — Ambulatory Visit (INDEPENDENT_AMBULATORY_CARE_PROVIDER_SITE_OTHER): Payer: Medicare Other | Admitting: Internal Medicine

## 2013-01-31 ENCOUNTER — Encounter: Payer: Self-pay | Admitting: Internal Medicine

## 2013-01-31 VITALS — BP 133/84 | HR 63 | Temp 97.6°F | Wt 144.4 lb

## 2013-01-31 DIAGNOSIS — R5383 Other fatigue: Secondary | ICD-10-CM

## 2013-01-31 DIAGNOSIS — R5381 Other malaise: Secondary | ICD-10-CM

## 2013-01-31 DIAGNOSIS — J309 Allergic rhinitis, unspecified: Secondary | ICD-10-CM | POA: Insufficient documentation

## 2013-01-31 DIAGNOSIS — I10 Essential (primary) hypertension: Secondary | ICD-10-CM

## 2013-01-31 DIAGNOSIS — K219 Gastro-esophageal reflux disease without esophagitis: Secondary | ICD-10-CM

## 2013-01-31 MED ORDER — LORATADINE 10 MG PO TABS: 10.0000 mg | ORAL_TABLET | Freq: Every day | ORAL | Status: DC

## 2013-01-31 MED ORDER — ACETAMINOPHEN 325 MG PO TABS: 650.0000 mg | ORAL_TABLET | Freq: Four times a day (QID) | ORAL | Status: DC | PRN

## 2013-01-31 NOTE — Progress Notes (Signed)
Subjective:    Patient ID: [name] [name], female    DOB: [birth year], 67 y.o.   MRN: [id number]  HPI  Here for follow up -reviewed chronic medical issues:  IBS -diarrhea predominate Exacerbation of symptoms with stress: severe cramping - uses Levsin prn - also careful attention to diet  GERD - improved with intermittent Prevacid use since 12/2010 prior EGD for same 2008, 10/2010 and 03/2012: no ulcers, s/p esoph dialtion of distal ring in 2011, mod gastritis and H pylori neg not improved symptoms on omeprazole or nexium  s/p cardiac eval 06/2009 for same pain- echo and nuc stress neg CT a/p 12/2010 - unremarkable -   osteopenia - previously prescribed bisphosphonates (aldentronate weekly) starting 2004 - 08/2010 (>7y). pt stopped mid 08/2010 due to stomach and esophageal stricture symptoms  - no history fracture, no bone pain  hypertension - reports compliance with ongoing medical treatment and since change in medication to XR toprol (qd) 12/2010. denies adverse side effects related to current therapy.  Generalized osteoarthritis - affects knees, hands, back. Uses OTC meds for same as mobic ineffective - reports compliance with ongoing medical treatment and no changes in medication dose or frequency. denies adverse side effects related to current therapy. No weakness or falls  Past Medical History  Diagnosis Date  . Migraine   . Hypertension   . GERD (gastroesophageal reflux disease) 11/08/2007, 04/12/2012  . IBS (irritable bowel syndrome)     diarrhea predom  . Hiatal hernia 11/08/2007, 04/12/2012  . 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 History of kidney stones   . Gastritis 11/08/2007, 04/12/2012    H pylori bx neg 03/2012   Review of Systems  Constitutional: Positive for fatigue. Negative for fever and unexpected weight change.  HENT: Positive for congestion and postnasal drip. Negative for sneezing.   Respiratory: Negative for cough and shortness  of breath.   Cardiovascular: Negative for chest pain, palpitations and leg swelling.  Gastrointestinal: Positive for diarrhea. Negative for constipation, blood in stool and anal bleeding.  Neurological: Negative for weakness.      Objective:   Physical Exam  BP 133/84  Pulse 63  Temp(Src) 97.6 F (36.4 C) (Oral)  Wt 144 lb 6.4 oz (65.499 kg)  BMI 25.59 kg/m2  SpO2 96% Wt Readings from Last 3 Encounters:  01/31/13 144 lb 6.4 oz (65.499 kg)  08/05/12 140 lb 3.2 oz (63.594 kg)  08/02/12 140 lb 1.9 oz (63.558 kg)   Constitutional: She appears well-developed and well-nourished. No distress. Cardiovascular: Normal rate, regular rhythm and normal heart sounds.  No murmur heard. no BLE edema Pulmonary/Chest: Effort normal and breath sounds normal. No respiratory distress. She has no wheezes.  Abdominal: Soft. Bowel sounds are normal. She exhibits no distension. There is no tenderness. Psychiatric: She has a normal mood and affect. Her behavior is normal. Judgment and thought content normal.   Lab Results  Component Value Date   WBC 8.4 03/15/2012   HGB 15.3* 03/15/2012   HCT 45.0 03/15/2012   PLT 212 03/15/2012   CHOL 190 01/21/2012   TRIG 151.0* 01/21/2012   HDL 48.70 01/21/2012   ALT 17 03/19/2012   AST 21 03/19/2012   NA 143 03/15/2012   K 3.6 03/15/2012   CL 110 03/15/2012   CREATININE 1.00 03/15/2012   BUN 20 03/15/2012   CO2 28 01/21/2012   TSH 0.77 01/21/2012        Assessment & Plan:  Time spent with pt  today 25 minutes, greater than 50% time spent counseling patient hypertension and IBS management and review of interval records  see problem list. Medications and labs reviewed today.  Fatigue - nonspecific symptoms/exam - check screening labs

## 2013-01-31 NOTE — Assessment & Plan Note (Signed)
Doing well - changed Bbloc to 24h XR in 03/2011 at pt request to improve compliance - Also exacerbated by pain episodes -  Continue clonidine prn SBP>160 as previously prescribed The current medical regimen is effective;  continue present plan and medications. BP Readings from Last 3 Encounters:  01/31/13 133/84  11/01/12 120/82  08/02/12 120/82

## 2013-01-31 NOTE — Assessment & Plan Note (Addendum)
symptoms much improved on generic prevacid (prev tx nexium, dexilant and prilosec ineffective) Encouraged to continue same status post GI eval 03/2012 EGD with mod gastritis but + clo h pylori also CT a/p 12/2010 - unremarkable The current medical regimen is effective;  continue present plan and medications.

## 2013-01-31 NOTE — Patient Instructions (Addendum)
It was good to see you today. We have reviewed your prior records including labs and tests today Medications reviewed, no changes at this time. Try taking Toprol XL before bed instead of the AM for blood pressure control Use over-the-counter acetaminophen in place of Tylenol arthritis and take Claritin or generic loritidine for ear and eye itching symptoms Test(s) ordered today - return when you are fasting. Your results will be released to MyChart (or called to you) after review, usually within 72hours after test completion. If any changes need to be made, you will be notified at that same time. Please schedule followup in 6 months, call sooner if problems.

## 2013-02-07 ENCOUNTER — Other Ambulatory Visit (INDEPENDENT_AMBULATORY_CARE_PROVIDER_SITE_OTHER): Payer: Medicare Other

## 2013-02-07 DIAGNOSIS — R5381 Other malaise: Secondary | ICD-10-CM

## 2013-02-07 DIAGNOSIS — I10 Essential (primary) hypertension: Secondary | ICD-10-CM

## 2013-02-07 DIAGNOSIS — R5383 Other fatigue: Secondary | ICD-10-CM

## 2013-02-07 LAB — CBC WITH DIFFERENTIAL/PLATELET
Eosinophils Relative: 6.5 % — ABNORMAL HIGH (ref 0.0–5.0)
HCT: 42 % (ref 36.0–46.0)
Hemoglobin: 14.3 g/dL (ref 12.0–15.0)
Lymphs Abs: 1.9 10*3/uL (ref 0.7–4.0)
MCV: 95.6 fl (ref 78.0–100.0)
Monocytes Absolute: 0.6 10*3/uL (ref 0.1–1.0)
Neutro Abs: 3.3 10*3/uL (ref 1.4–7.7)
Platelets: 228 10*3/uL (ref 150.0–400.0)
WBC: 6.2 10*3/uL (ref 4.5–10.5)

## 2013-02-07 LAB — HEPATIC FUNCTION PANEL
Bilirubin, Direct: 0.1 mg/dL (ref 0.0–0.3)
Total Bilirubin: 0.7 mg/dL (ref 0.3–1.2)

## 2013-02-07 LAB — BASIC METABOLIC PANEL
BUN: 16 mg/dL (ref 6–23)
Chloride: 103 mEq/L (ref 96–112)
Glucose, Bld: 95 mg/dL (ref 70–99)
Potassium: 4.1 mEq/L (ref 3.5–5.1)
Sodium: 138 mEq/L (ref 135–145)

## 2013-02-07 LAB — LDL CHOLESTEROL, DIRECT: Direct LDL: 133 mg/dL

## 2013-02-07 LAB — LIPID PANEL
Total CHOL/HDL Ratio: 5
VLDL: 25.6 mg/dL (ref 0.0–40.0)

## 2013-04-16 ENCOUNTER — Other Ambulatory Visit: Payer: Self-pay | Admitting: Internal Medicine

## 2013-04-24 DIAGNOSIS — B029 Zoster without complications: Secondary | ICD-10-CM

## 2013-04-24 HISTORY — DX: Zoster without complications: B02.9

## 2013-05-03 ENCOUNTER — Encounter: Payer: Self-pay | Admitting: Internal Medicine

## 2013-05-03 ENCOUNTER — Ambulatory Visit (INDEPENDENT_AMBULATORY_CARE_PROVIDER_SITE_OTHER): Payer: Medicare Other | Admitting: Internal Medicine

## 2013-05-03 VITALS — BP 140/90 | HR 64 | Temp 98.5°F | Ht 62.0 in | Wt 144.4 lb

## 2013-05-03 DIAGNOSIS — M25562 Pain in left knee: Secondary | ICD-10-CM

## 2013-05-03 DIAGNOSIS — M25569 Pain in unspecified knee: Secondary | ICD-10-CM

## 2013-05-03 DIAGNOSIS — M7702 Medial epicondylitis, left elbow: Secondary | ICD-10-CM

## 2013-05-03 DIAGNOSIS — M7700 Medial epicondylitis, unspecified elbow: Secondary | ICD-10-CM

## 2013-05-03 DIAGNOSIS — M25561 Pain in right knee: Secondary | ICD-10-CM

## 2013-05-03 DIAGNOSIS — M7701 Medial epicondylitis, right elbow: Secondary | ICD-10-CM

## 2013-05-03 MED ORDER — PREDNISONE 10 MG PO TABS: ORAL_TABLET | ORAL | Status: DC

## 2013-05-03 MED ORDER — TRAMADOL HCL 50 MG PO TABS: 50.0000 mg | ORAL_TABLET | Freq: Four times a day (QID) | ORAL | Status: DC | PRN

## 2013-05-03 NOTE — Progress Notes (Signed)
Subjective:    Patient ID: [name] [name], female    DOB: 1945-11-30, 67 y.o.   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  Has no knee swelling and post is posterior only.  Sitting , resting makes better,  tylenol not helping.  Pt denies chest pain, increased sob or doe, wheezing, orthopnea, PND, increased LE swelling, palpitations, dizziness or syncope.  Pt denies new neurological symptoms such as new headache, or facial or extremity weakness or numbness   Past Medical History  Diagnosis Date  . Migraine   . Hypertension   . GERD (gastroesophageal reflux disease) 11/08/2007, 04/12/2012  . IBS (irritable bowel syndrome)     diarrhea predom  . Hiatal hernia 11/08/2007, 04/12/2012  . 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 History of kidney stones   . Gastritis 11/08/2007, 04/12/2012    H pylori bx neg 03/2012   Past Surgical History  Procedure Laterality Date  . Appendectomy  1958  . Tonsillectomy  1960  . Maxilofacial  1992  . Cystocele repair  2008    reports that she has never smoked. She has never used smokeless tobacco. She reports that she does not drink alcohol or use illicit drugs. family history includes Hypertension in her mother and Stroke in her mother.  There is no history of Colon cancer. No Known Allergies Current Outpatient Prescriptions on File Prior to Visit  Medication Sig Dispense Refill  . acetaminophen (TYLENOL) 325 MG tablet Take 2 tablets (650 mg total) by mouth every 6 (six) hours as needed for pain or fever.      Marland Kitchen aspirin 81 MG tablet Take 81 mg by mouth 3 (three) times a week. Monday, Wednesday, and Fridays      .  clidinium-chlordiazePOXIDE (LIBRAX) 2.5-5 MG per capsule Take 1 capsule by mouth 3 (three) times daily as needed.      . cloNIDine (CATAPRES) 0.1 MG tablet 0.1 mg tab by mouth as needed for blood pressure over 160/90; may repeat in 1 hour if needed  30 tablet  3  . loratadine (CLARITIN) 10 MG tablet Take 1 tablet (10 mg total) by mouth daily.  30 tablet  11  . metoprolol succinate (TOPROL-XL) 50 MG 24 hr tablet TAKE 1 TABLET BY MOUTH EVERY DAY  90 tablet  1  . Multiple Minerals-Vitamins (CITRACAL PLUS) TABS Take by mouth daily.         No current facility-administered medications on file prior to visit.   Review of Systems  Constitutional: Negative for unexpected weight change, or unusual diaphoresis  HENT: Negative for tinnitus.   Eyes: Negative for photophobia and visual disturbance.  Respiratory: Negative for choking and stridor.   Gastrointestinal: Negative for vomiting and blood in stool.  Genitourinary: Negative for hematuria and decreased urine volume.  Musculoskeletal: Negative for acute joint swelling Skin: Negative for color change and wound.  Neurological: Negative for tremors and numbness other than noted  Psychiatric/Behavioral: Negative for decreased concentration or  hyperactivity.       Objective:   Physical Exam BP 140/90  Pulse 64  Temp(Src) 98.5 F (36.9 C) (Oral)  Ht 5\' 2"  (1.575 m)  Wt 144 lb 6 oz (65.488 kg)  BMI 26.4 kg/m2  SpO2 97% VS noted,  Constitutional: Pt appears well-developed and well-nourished.  HENT: Head: NCAT.  Right Ear: External ear normal.  Left Ear: External ear normal.  Eyes: Conjunctivae and EOM are normal. Pupils are equal, round, and reactive to light.  Neck: Normal range of motion. Neck supple.  Cardiovascular: Normal rate and regular rhythm.   Pulmonary/Chest: Effort normal and breath sounds normal.  + Bilateral medial epicondylar tender/swelling noted Right knee with tender insertion sites of post lateral and medial ligamentous  complexes, no effusion, FROM, no crepitus, no baker cyst, surgical scar noted Left knee with tender to medial ligamentous complex only, no effusion, NT, FROM, minor crepitus  Neurological: Pt is alert. Not confused  Skin: Skin is warm. No erythema.  Psychiatric: Pt behavior is normal. Thought content normal. 1+ nervous    Assessment & Plan:

## 2013-05-03 NOTE — Patient Instructions (Signed)
Please take all new medication as prescribed - the prednisone (for anti-inflammatory) and pain medication Please continue all other medications as before, and refills have been done if requested.  Please remember to sign up for My Chart if you have not done so, as this will be important to you in the future with finding out test results, communicating by private email, and scheduling acute appointments online when needed.

## 2013-05-04 NOTE — Assessment & Plan Note (Signed)
Etiology unclear but c/w tendonitis, no evidence for repeat baker's cyst, declines ortho, for rest, consider knee sleeve, predpack asd, educated, reassured,  to f/u any worsening symptoms or concerns

## 2013-05-04 NOTE — Assessment & Plan Note (Addendum)
Etiology unclear but c/w tendonitis, declines ortho, for rest, conisder knee sleeve, predpack asd, educated, reassured,  to f/u any worsening symptoms or concerns

## 2013-05-04 NOTE — Assessment & Plan Note (Signed)
Etiology unclear, declines ortho, for rest, arm band, predpack asd, educated, reassured,  to f/u any worsening symptoms or concerns

## 2013-05-04 NOTE — Assessment & Plan Note (Signed)
Etiology unclear, declines ortho, for rest, arm band, predpack asd, educated, reassured,  to f/u any worsening symptoms or concerns  

## 2013-05-07 ENCOUNTER — Telehealth: Payer: Self-pay | Admitting: Family Medicine

## 2013-05-07 MED ORDER — VALACYCLOVIR HCL 1 G PO TABS: 1000.0000 mg | ORAL_TABLET | Freq: Three times a day (TID) | ORAL | Status: DC

## 2013-05-07 NOTE — Telephone Encounter (Signed)
Call from pt's son.  Itchy dermatomal rash recently erupted, c/w shingles. rx for valtrex sent. Note to PCP as FYI.

## 2013-08-01 ENCOUNTER — Ambulatory Visit (INDEPENDENT_AMBULATORY_CARE_PROVIDER_SITE_OTHER): Payer: Medicare Other | Admitting: Internal Medicine

## 2013-08-01 ENCOUNTER — Encounter: Payer: Self-pay | Admitting: Internal Medicine

## 2013-08-01 VITALS — BP 130/78 | HR 62 | Temp 98.9°F | Wt 143.2 lb

## 2013-08-01 DIAGNOSIS — I8390 Asymptomatic varicose veins of unspecified lower extremity: Secondary | ICD-10-CM

## 2013-08-01 DIAGNOSIS — M899 Disorder of bone, unspecified: Secondary | ICD-10-CM

## 2013-08-01 DIAGNOSIS — K589 Irritable bowel syndrome without diarrhea: Secondary | ICD-10-CM

## 2013-08-01 DIAGNOSIS — I10 Essential (primary) hypertension: Secondary | ICD-10-CM

## 2013-08-01 DIAGNOSIS — Z23 Encounter for immunization: Secondary | ICD-10-CM

## 2013-08-01 MED ORDER — MENTHOL (TOPICAL ANALGESIC) 4 % EX GEL: CUTANEOUS | Status: DC

## 2013-08-01 NOTE — Assessment & Plan Note (Signed)
Previously on weekly Fosamax 2004 through October 2011 - stopped same because of GI reflux symptoms and esophageal stricture Recheck DEXA 12/2011 stable - reviewed with pt today  Advised continued calcium plus vitamin D supplements and weightbearing exercise

## 2013-08-01 NOTE — Patient Instructions (Signed)
It was good to see you today. Flu shot updated today Medications reviewed and updated, no changes recommended at this time. Please schedule followup in 6 months for annual visit and labs (cholesterol), call sooner if problems.

## 2013-08-01 NOTE — Assessment & Plan Note (Signed)
Education and reassurance provided - Ok to use OTC NSAIDs or topical BioFreeze prn

## 2013-08-01 NOTE — Assessment & Plan Note (Signed)
Doing well - changed Bbloc to 24h XR in 03/2011 at pt request to improve compliance - Also exacerbated by pain episodes -  Continue clonidine prn SBP>160 as previously prescribed The current medical regimen is effective;  continue present plan and medications. BP Readings from Last 3 Encounters:  08/01/13 142/78  05/03/13 140/90  01/31/13 133/84

## 2013-08-01 NOTE — Assessment & Plan Note (Signed)
Long hx same with abdominal pain, bloating and bowel changes -diarrhea predominate GI eval 2008, 09/2010 and 03/2012 for same ([facility]) - largely unremarkable also CT a/p 12/2010 unremarkable Celiac testing negative Temporary relief with levbid - continue same prn

## 2013-08-01 NOTE — Progress Notes (Signed)
  Subjective:    Patient ID: [name] [name], female    DOB: 1945-12-27, 67 y.o.   MRN: [id number]  HPI Here for follow up -reviewed chronic medical issues and interval medical events:  complains of occasional aches in legs due to varicose veins - no bleeding or ulceration  Past Medical History  Diagnosis Date  . Migraine   . Hypertension   . GERD (gastroesophageal reflux disease) 11/08/2007, 04/12/2012  . IBS (irritable bowel syndrome)     diarrhea predom  . Hiatal hernia 11/08/2007, 04/12/2012  . 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 History of kidney stones   . Gastritis 11/08/2007, 04/12/2012    H pylori bx neg 03/2012  . Shingles outbreak 04/2013   Review of Systems  Constitutional: Positive for fatigue. Negative for fever and unexpected weight change.  HENT: Positive for congestion. Negative for sneezing and postnasal drip.   Respiratory: Negative for cough and shortness of breath.   Cardiovascular: Negative for chest pain, palpitations and leg swelling.  Gastrointestinal: Positive for diarrhea (chronic habit). Negative for constipation, blood in stool and anal bleeding.  Neurological: Negative for weakness.      Objective:   Physical Exam BP 130/78  Pulse 62  Temp(Src) 98.9 F (37.2 C) (Oral)  Wt 143 lb 3.2 oz (64.955 kg)  BMI 26.18 kg/m2  SpO2 96% Wt Readings from Last 3 Encounters:  08/01/13 143 lb 3.2 oz (64.955 kg)  05/03/13 144 lb 6 oz (65.488 kg)  01/31/13 144 lb 6.4 oz (65.499 kg)   Constitutional: She appears well-developed and well-nourished. No distress. Cardiovascular: Normal rate, regular rhythm and normal heart sounds.  No murmur heard. no BLE edema Pulmonary/Chest: Effort normal and breath sounds normal. No respiratory distress. She has no wheezes.  Abdominal: Soft. Bowel sounds are normal. She exhibits no distension. There is no tenderness. Skin: BLE with varicose veins, no ulceration Psychiatric: She has a normal  mood and affect. Her behavior is normal. Judgment and thought content normal.   Lab Results  Component Value Date   WBC 6.2 02/07/2013   HGB 14.3 02/07/2013   HCT 42.0 02/07/2013   PLT 228.0 02/07/2013   CHOL 211* 02/07/2013   TRIG 128.0 02/07/2013   HDL 40.50 02/07/2013   LDLDIRECT 133.0 02/07/2013   ALT 16 02/07/2013   AST 18 02/07/2013   NA 138 02/07/2013   K 4.1 02/07/2013   CL 103 02/07/2013   CREATININE 0.7 02/07/2013   BUN 16 02/07/2013   CO2 28 02/07/2013   TSH 1.07 02/07/2013       Assessment & Plan:   Time spent with pt today 25 minutes, greater than 50% time spent counseling patient hypertension and IBS management and review of interval records  see problem list. Medications and labs reviewed today.

## 2013-08-08 ENCOUNTER — Ambulatory Visit: Payer: Medicare Other | Admitting: Internal Medicine

## 2013-10-03 ENCOUNTER — Other Ambulatory Visit (INDEPENDENT_AMBULATORY_CARE_PROVIDER_SITE_OTHER): Payer: Medicare Other

## 2013-10-03 ENCOUNTER — Encounter: Payer: Self-pay | Admitting: Family Medicine

## 2013-10-03 ENCOUNTER — Ambulatory Visit (INDEPENDENT_AMBULATORY_CARE_PROVIDER_SITE_OTHER): Payer: Medicare Other | Admitting: Family Medicine

## 2013-10-03 VITALS — BP 134/82 | HR 90 | Wt 143.0 lb

## 2013-10-03 DIAGNOSIS — M222X9 Patellofemoral disorders, unspecified knee: Secondary | ICD-10-CM | POA: Insufficient documentation

## 2013-10-03 DIAGNOSIS — M222X1 Patellofemoral disorders, right knee: Secondary | ICD-10-CM

## 2013-10-03 DIAGNOSIS — M1710 Unilateral primary osteoarthritis, unspecified knee: Secondary | ICD-10-CM

## 2013-10-03 DIAGNOSIS — M25561 Pain in right knee: Secondary | ICD-10-CM

## 2013-10-03 DIAGNOSIS — S83289A Other tear of lateral meniscus, current injury, unspecified knee, initial encounter: Secondary | ICD-10-CM

## 2013-10-03 DIAGNOSIS — M170 Bilateral primary osteoarthritis of knee: Secondary | ICD-10-CM | POA: Insufficient documentation

## 2013-10-03 DIAGNOSIS — M25569 Pain in unspecified knee: Secondary | ICD-10-CM

## 2013-10-03 DIAGNOSIS — S83281A Other tear of lateral meniscus, current injury, right knee, initial encounter: Secondary | ICD-10-CM

## 2013-10-03 DIAGNOSIS — M1711 Unilateral primary osteoarthritis, right knee: Secondary | ICD-10-CM

## 2013-10-03 DIAGNOSIS — IMO0002 Reserved for concepts with insufficient information to code with codable children: Secondary | ICD-10-CM

## 2013-10-03 HISTORY — DX: Patellofemoral disorders, unspecified knee: M22.2X9

## 2013-10-03 HISTORY — DX: Bilateral primary osteoarthritis of knee: M17.0

## 2013-10-03 HISTORY — DX: Other tear of lateral meniscus, current injury, unspecified knee, initial encounter: S83.289A

## 2013-10-03 NOTE — Progress Notes (Signed)
CC: Knee pain  HPI: Patient is a very pleasant 67 year old female coming in with bilateral knee pain. Patient was seen for this previously approximately 5 months ago. Patient was given prednisone as well as tramadol for pain relief. Patient can planing though that she's had acute worsening pain of the right knee. Patient states it seems to be worse over the last 2 months. Patient states that in that seems to make it more difficult is when she is going from a sitting to standing position. Patient does have a sharp pain on the lateral aspect of the knee. Patient states that it does feel unstable with ambulation sometimes. Patient is very concerned because she does like to play tennis and unfortunately secondary to the pain she has not been able to for quite some time. Patient states that she has had swelling in this knee but not as bad as she's had previously on her contralateral side. Patient states that she did have a dull aching pain in the posterior aspect of his knee at night. Patient denies though any radiation to the extremity or any numbness. Patient is still able to ambulate but states that she is still having some more pain. Patient the pain severity at 7/10.  Patient does have a past medical history significant for a left-sided meniscectomy as well as Baker cyst removal.  Past medical, surgical, family and social history reviewed. Medications reviewed all in the electronic medical record.   Review of Systems: No headache, visual changes, nausea, vomiting, diarrhea, constipation, dizziness, abdominal pain, skin rash, fevers, chills, night sweats, weight loss, swollen lymph nodes, body aches, joint swelling, muscle aches, chest pain, shortness of breath, mood changes.   Objective:    Blood pressure 134/82, pulse 90, weight 143 lb (64.864 kg), SpO2 92.00%.   General: No apparent distress alert and oriented x3 mood and affect normal, dressed appropriately.  HEENT: Pupils equal, extraocular  movements intact Respiratory: Patient's speak in full sentences and does not appear short of breath Cardiovascular: No lower extremity edema, non tender, no erythema Skin: Warm dry intact with no signs of infection or rash on extremities or on axial skeleton. Abdomen: Soft nontender Neuro: Cranial nerves II through XII are intact, neurovascularly intact in all extremities with 2+ DTRs and 2+ pulses. Lymph: No lymphadenopathy of posterior or anterior cervical chain or axillae bilaterally.  Gait normal with good balance and coordination.  MSK: Non tender with full range of motion and good stability and symmetric strength and tone of shoulders, elbows, wrist, hip and ankles bilaterally.  Knee: Right Normal to inspection with no erythema or effusion or obvious bony abnormalities. Mild osteoarthritic changes bilaterally Patient is somewhat tender to palpation over the medial and lateral joint lines mild tenderness over the superior lateral aspect of the patella. ROM full in flexion and extension and lower leg rotation. Ligaments with solid consistent endpoints including ACL, PCL, LCL, MCL. Positive Mcmurray's, Apley's, and Thessalonian tests. Positive painful patellar compression. Patellar glide with moderate crepitus. Patellar and quadriceps tendons unremarkable. Hamstring and quadriceps strength is normal. Patient does have significant tightness of the hamstrings bilaterally Contralateral knee is very similar presentation without meniscal symptoms. Neuro vascular intact distally.  MSK US performed of: Right knee This study was ordered, performed, and interpreted by Terrilee Files D.O.  Knee: All structures visualized. Anteromedial,  posteromedial,  menisci unremarkable without tearing, fraying, effusion, or displacement. Mild degenerative changes of the joint space Patient unfortunately has anterior lateral and posterior lateral meniscal tear. This appears  to be degenerative but is very large  encompassing approximately 80% of the meniscus. Mild displacement of the meniscus noted. Patellar Tendon unremarkable on long and transverse views without effusion. Quadricep tendon though does show patient have partial tear of the lateral aspect secondary to chronic patellofemoral syndrome. Mild spurring of the superior lateral aspect of the patella seen. No abnormality of prepatellar bursa. LCL and MCL unremarkable on long and transverse views. No abnormality of origin of medial or lateral head of the gastrocnemius.  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 Osteoarthritis noted     Impression and Recommendations:     This case required medical decision making of moderate complexity.

## 2013-10-03 NOTE — Assessment & Plan Note (Signed)
Patient does have findings on physical exam in ultrasound today that is consistent with a lateral meniscal tear. This does appear to be degenerative in nature with mild hypoechoic changes on ultrasound. Patient was given a brace today and home exercise program. Patient will try over-the-counter medications more specifically for the underlying osteoarthritis which probably contributed to the problem. Patient will followup in 3-4 weeks. Patient continues to have pain at that time we'll consider doing an injection and formal physical therapy.  Also a be good to get baseline x-rays at that time. AP standing.

## 2013-10-03 NOTE — Assessment & Plan Note (Signed)
Patient given home exercise program that can be beneficial, discussed other activities that would be more considerate to her knees, also discussed over-the-counter medications that would be safe and possibly helpful. Followup in 3-4 weeks.

## 2013-10-03 NOTE — Patient Instructions (Signed)
It is wonderful to finally meet you both! Wear the brace dialy for next 2 weeks Ice 20 minutes can be helpful 2 times a day Take tylenol 650 mg three times a day is the best evidence based medicine we have for arthritis.  Aleve 1-2 tabs twice a day with food or ibuprofen 600mg  twice daily with food can be added to tylenol.  Glucosamine sulfate 750mg  twice a day is a supplement that has been shown to help moderate to severe arthritis. Vitamin D 1000 IU daily Fish oil 3 grams daily Tumeric 500mg  twice daily.  Capsaicin topically up to four times a day may also help with pain. Cortisone injections are an option if these interventions do not seem to make a difference or need more relief.  If cortisone injections do not help, there are different types of shots that may help but they take longer to take effect.  We can discuss this at follow up.  It's important that you continue to stay active. Consider physical therapy to strengthen muscles around the joint that hurts to take pressure off of the joint itself. Shoe inserts with good arch support may be helpful. Walker or cane if needed. Heat or ice 20 minutes at a time 3-4 times a day as needed to help with pain. Water aerobics and cycling with low resistance are the best two types of exercise for arthritis. Come back and see me in 3-4 weeks.

## 2013-10-13 ENCOUNTER — Encounter: Payer: Self-pay | Admitting: Internal Medicine

## 2013-10-13 ENCOUNTER — Ambulatory Visit (INDEPENDENT_AMBULATORY_CARE_PROVIDER_SITE_OTHER): Payer: Medicare Other | Admitting: Internal Medicine

## 2013-10-13 VITALS — BP 148/84 | HR 65 | Temp 98.4°F | Wt 145.6 lb

## 2013-10-13 DIAGNOSIS — I10 Essential (primary) hypertension: Secondary | ICD-10-CM

## 2013-10-13 DIAGNOSIS — D229 Melanocytic nevi, unspecified: Secondary | ICD-10-CM

## 2013-10-13 DIAGNOSIS — D239 Other benign neoplasm of skin, unspecified: Secondary | ICD-10-CM

## 2013-10-13 MED ORDER — LOSARTAN POTASSIUM 25 MG PO TABS: 25.0000 mg | ORAL_TABLET | Freq: Every day | ORAL | Status: DC

## 2013-10-13 NOTE — Progress Notes (Signed)
Subjective:    Patient ID: [name] [name], female    DOB: 11-17-1946, 67 y.o.   MRN: [id number]  HPI Here for follow up -reviewed chronic medical issues and interval medical events:  Also requests removal of skin growth on left shoulder/neck due to rapid increase in size over past 6 weeks  Past Medical History  Diagnosis Date  . Migraine   . Hypertension   . GERD (gastroesophageal reflux disease) 11/08/2007, 04/12/2012  . IBS (irritable bowel syndrome)     diarrhea predom  . Hiatal hernia 11/08/2007, 04/12/2012  . 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 History of kidney stones   . Gastritis 11/08/2007, 04/12/2012    H pylori bx neg 03/2012  . Shingles outbreak 04/2013   Review of Systems  Constitutional: Positive for fatigue. Negative for fever and unexpected weight change.  HENT: Positive for congestion. Negative for postnasal drip and sneezing.   Respiratory: Negative for cough and shortness of breath.   Cardiovascular: Negative for chest pain, palpitations and leg swelling.  Gastrointestinal: Positive for diarrhea (chronic habit). Negative for constipation, blood in stool and anal bleeding.  Neurological: Negative for weakness.      Objective:   Physical Exam BP 148/84  Pulse 65  Temp(Src) 98.4 F (36.9 C) (Oral)  Wt 145 lb 9.6 oz (66.044 kg)  SpO2 96% Wt Readings from Last 3 Encounters:  10/13/13 145 lb 9.6 oz (66.044 kg)  10/03/13 143 lb (64.864 kg)  08/01/13 143 lb 3.2 oz (64.955 kg)   Constitutional: She appears well-developed and well-nourished. No distress. spouse at side Cardiovascular: Normal rate, regular rhythm and normal heart sounds.  No murmur heard. no BLE edema Pulmonary/Chest: Effort normal and breath sounds normal. No respiratory distress. She has no wheezes. Skin: Verrucous growth flesh colored 11mm diameter L shoulder/neck area - Psychiatric: She has a normal mood and affect. Her behavior is normal. Judgment and  thought content normal.   Lab Results  Component Value Date   WBC 6.2 02/07/2013   HGB 14.3 02/07/2013   HCT 42.0 02/07/2013   PLT 228.0 02/07/2013   CHOL 211* 02/07/2013   TRIG 128.0 02/07/2013   HDL 40.50 02/07/2013   LDLDIRECT 133.0 02/07/2013   ALT 16 02/07/2013   AST 18 02/07/2013   NA 138 02/07/2013   K 4.1 02/07/2013   CL 103 02/07/2013   CREATININE 0.7 02/07/2013   BUN 16 02/07/2013   CO2 28 02/07/2013   TSH 1.07 02/07/2013       Procedure Note :  Skin biopsy   Indication:  Changing mole (s ),  Suspicious lesion(s)   Risks including unsuccessful procedure, bleeding, infection, bruising, scar, a need for another complete procedure and others were explained to the patient in detail as well as the benefits. Informed consent was obtained and signed.   The patient was placed in a decubitus position.  Lesion #1 on  left neck, shoulder region  measuring 11mm   Skin over lesion #1  was prepped with Betadine and alcohol and anesthetized with 1 cc of 2% lidocaine and epinephrine, using a 25-gauge 1 inch needle.  Shave biopsy with a sterile Dermablade was carried out in the usual fashion. Hyfrecator was used to destroy the rest of the lesion potentially left behind and for hemostasis. Band-Aid was applied with antibiotic ointment.    Tolerated well. Complications none.  Aftercare instructions provided -see AVS   Assessment & Plan:    Change in skin lesion. Left  shoulder/neck - shave bx as above - follow up path After care instructions provided  Also see problem list. Medications and labs reviewed today.

## 2013-10-13 NOTE — Assessment & Plan Note (Signed)
Doing well - changed Bbloc to 24h XR in 03/2011 at pt request to improve compliance - Also exacerbated by pain episodes -  Continue clonidine prn SBP>160 as previously prescribed Add ARB low dose now - erx done  BP Readings from Last 3 Encounters:  10/13/13 148/84  10/03/13 134/82  08/01/13 130/78

## 2013-10-13 NOTE — Patient Instructions (Addendum)
It was good to see you today.  Add losartan each AM to your blood pressure medications  Your prescription(s) have been submitted to your pharmacy. Please take as directed and contact our office if you believe you are having problem(s) with the medication(s).  A Band-Aid should be changed twice daily starting tomorrow. You can take a shower tomorrow.  Keep the wounds clean. You can wash them with liquid soap and water. Pat dry with gauze or a Kleenex tissue  Before applying antibiotic ointment and a Band-Aid.   You need to report immediately  if fever, chills or any signs of infection develop.   The biopsy results should be available in 1 -2 weeks.  Please schedule followup in 3-4 months as scheduled, call sooner if problems.  Biopsy Care After These instructions give you information on caring for yourself after your procedure. Your doctor may also give you more specific instructions. Call your doctor if you have any problems or questions after your procedure. HOME CARE   Return to your normal diet and activities as told by your doctor.  Change your bandages (dressings) as told by your doctor. If skin glue (adhesive) was used, it will peel off in 7 days.  Only take medicines as told by your doctor.  Ask your doctor when you can bathe and get your wound wet. GET HELP RIGHT AWAY IF:  You see more than a small spot of blood coming from the wound.  You have redness, puffiness (swelling), or pain.  You see yellowish-white fluid (pus) coming from the wound.  You have a fever.  You notice a bad smell coming from the wound or bandage.  You have a rash, trouble breathing, or any allergy problems. MAKE SURE YOU:   Understand these instructions.  Will watch your condition.  Will get help right away if you are not doing well or get worse. Document Released: 07/15/2011 Document Revised: 02/02/2012 Document Reviewed: 07/15/2011 [facility] Patient Information 2014 [place], [place].

## 2013-10-13 NOTE — Progress Notes (Signed)
Pre-visit discussion using our clinic review tool. No additional management support is needed unless otherwise documented below in the visit note.  

## 2013-10-19 ENCOUNTER — Encounter: Payer: Self-pay | Admitting: Internal Medicine

## 2013-10-25 ENCOUNTER — Ambulatory Visit (INDEPENDENT_AMBULATORY_CARE_PROVIDER_SITE_OTHER)
Admission: RE | Admit: 2013-10-25 | Discharge: 2013-10-25 | Disposition: A | Discharge status: Home / Self Care | Payer: Medicare Other | Source: Ambulatory Visit | Attending: Family Medicine | Admitting: Family Medicine

## 2013-10-25 ENCOUNTER — Ambulatory Visit (INDEPENDENT_AMBULATORY_CARE_PROVIDER_SITE_OTHER): Payer: Medicare Other | Admitting: Family Medicine

## 2013-10-25 ENCOUNTER — Encounter: Payer: Self-pay | Admitting: Family Medicine

## 2013-10-25 VITALS — BP 128/84 | HR 66 | Wt 144.0 lb

## 2013-10-25 DIAGNOSIS — S83289A Other tear of lateral meniscus, current injury, unspecified knee, initial encounter: Secondary | ICD-10-CM

## 2013-10-25 DIAGNOSIS — S83281A Other tear of lateral meniscus, current injury, right knee, initial encounter: Secondary | ICD-10-CM

## 2013-10-25 NOTE — Progress Notes (Signed)
   CC: Knee pain  HPI: Patient is a very pleasant 67 year old female coming in with bilateral knee pain. Patient was having more pain in the right knee. Patient did have an ultrasound that did show a large anterior lateral to posterior lateral degenerative meniscal tear. Patient was given a brace and home exercises previously. Patient did try over-the-counter medications that could be beneficial as well. Patient states unfortunately the right knee seems to be worse. Patient has been doing the exercises regularly as was wearing the brace. Patient unfortunately states that she is still having trouble extending the knee the entire way. Patient is having more pain with ambulation.  Denies any new symptoms only worse of previous symptoms.   Patient does have a past medical history significant for a left-sided meniscectomy as well as Baker cyst removal.  Past medical, surgical, family and social history reviewed. Medications reviewed all in the electronic medical record.   Review of Systems: No headache, visual changes, nausea, vomiting, diarrhea, constipation, dizziness, abdominal pain, skin rash, fevers, chills, night sweats, weight loss, swollen lymph nodes, body aches, joint swelling, muscle aches, chest pain, shortness of breath, mood changes.   Objective:    Blood pressure 128/84, pulse 66, weight 144 lb (65.318 kg), SpO2 98.00%.   General: No apparent distress alert and oriented x3 mood and affect normal, dressed appropriately.  HEENT: Pupils equal, extraocular movements intact Respiratory: Patient's speak in full sentences and does not appear short of breath Cardiovascular: No lower extremity edema, non tender, no erythema Skin: Warm dry intact with no signs of infection or rash on extremities or on axial skeleton. Abdomen: Soft nontender Neuro: Cranial nerves II through XII are intact, neurovascularly intact in all extremities with 2+ DTRs and 2+ pulses. Lymph: No lymphadenopathy of  posterior or anterior cervical chain or axillae bilaterally.  Gait normal with good balance and coordination.  MSK: Non tender with full range of motion and good stability and symmetric strength and tone of shoulders, elbows, wrist, hip and ankles bilaterally.  Knee: Right Normal to inspection with no erythema or effusion or obvious bony abnormalities. Mild osteoarthritic changes bilaterally Patient is somewhat tender to palpation over the medial and lateral joint lines mild tenderness over the superior lateral aspect of the patella. ROM full in flexion and extension and lower leg rotation. Ligaments with solid consistent endpoints including ACL, PCL, LCL, MCL. Positive Mcmurray's, Apley's, and Thessalonian tests. Positive painful patellar compression. Patellar glide with moderate crepitus. Patellar and quadriceps tendons unremarkable. Hamstring and quadriceps strength is normal. Patient does have significant tightness of the hamstrings bilaterally Contralateral knee is very similar presentation without meniscal symptoms. Neuro vascular intact distally.  Procedure note.  After informed written and verbal consent, patient was seated on exam table. Right knee was prepped with alcohol swab and utilizing anterolateral approach, patient's right knee space was injected with 4:1  marcaine 0.5%: Kenalog 40mg /dL. Patient tolerated the procedure well without immediate complications.     Impression and Recommendations:     This case required medical decision making of moderate complexity.

## 2013-10-25 NOTE — Assessment & Plan Note (Signed)
Patient had injection today and tolerated it well with near complete resolution of pain. Patient still had trouble with some extension no. Patient will get x-rays to look at underlining arthritis . Formal physical therapy ordered Patient will try these interventions and come back and see me again in 4 weeks. If she continues to have trouble I would like to get an MRI. Patient has had a meniscectomy on the contralateral knee before. Depending on the amount of osteoarthritis this could still be a possibility.

## 2013-10-25 NOTE — Progress Notes (Signed)
Pre-visit discussion using our clinic review tool. No additional management support is needed unless otherwise documented below in the visit note.  

## 2013-10-25 NOTE — Patient Instructions (Signed)
You had an injection today.  Enjoy your holiday.  Wear the brace and continue exercises Physical therapy will call you Get xrays downstairs today.  Come back and see me again in 4 weeks.

## 2013-10-31 ENCOUNTER — Ambulatory Visit: Payer: Medicare Other | Attending: Family Medicine | Admitting: Physical Therapy

## 2013-10-31 ENCOUNTER — Other Ambulatory Visit: Payer: Self-pay | Admitting: Gastroenterology

## 2013-10-31 ENCOUNTER — Other Ambulatory Visit: Payer: Self-pay | Admitting: Internal Medicine

## 2013-10-31 DIAGNOSIS — M25569 Pain in unspecified knee: Secondary | ICD-10-CM | POA: Insufficient documentation

## 2013-10-31 DIAGNOSIS — IMO0001 Reserved for inherently not codable concepts without codable children: Secondary | ICD-10-CM | POA: Insufficient documentation

## 2013-11-01 ENCOUNTER — Other Ambulatory Visit: Payer: Self-pay | Admitting: *Deleted

## 2013-11-01 MED ORDER — BUTALBITAL-APAP-CAFFEINE 50-325-40 MG PO TABS: 1.0000 | ORAL_TABLET | ORAL | Status: DC | PRN

## 2013-11-01 NOTE — Telephone Encounter (Signed)
Faxed script back to [facility].../lmb 

## 2013-11-02 ENCOUNTER — Ambulatory Visit: Payer: Medicare Other | Admitting: Physical Therapy

## 2013-11-19 ENCOUNTER — Other Ambulatory Visit: Payer: Self-pay | Admitting: Internal Medicine

## 2013-12-08 ENCOUNTER — Other Ambulatory Visit: Payer: Self-pay | Admitting: Internal Medicine

## 2013-12-14 ENCOUNTER — Ambulatory Visit: Payer: Medicare Other | Admitting: Family Medicine

## 2013-12-14 ENCOUNTER — Other Ambulatory Visit (INDEPENDENT_AMBULATORY_CARE_PROVIDER_SITE_OTHER): Payer: Medicare Other

## 2013-12-14 ENCOUNTER — Encounter: Payer: Self-pay | Admitting: Family Medicine

## 2013-12-14 ENCOUNTER — Ambulatory Visit (INDEPENDENT_AMBULATORY_CARE_PROVIDER_SITE_OTHER): Payer: Medicare Other | Admitting: Family Medicine

## 2013-12-14 VITALS — BP 120/74 | HR 81 | Temp 97.6°F | Resp 16 | Wt 144.1 lb

## 2013-12-14 DIAGNOSIS — S83289A Other tear of lateral meniscus, current injury, unspecified knee, initial encounter: Secondary | ICD-10-CM

## 2013-12-14 DIAGNOSIS — M25569 Pain in unspecified knee: Secondary | ICD-10-CM

## 2013-12-14 MED ORDER — DICLOFENAC SODIUM 2 % TD SOLN: 2.0000 "application " | Freq: Two times a day (BID) | TRANSDERMAL | Status: DC

## 2013-12-14 NOTE — Progress Notes (Signed)
Pre-visit discussion using our clinic review tool. No additional management support is needed unless otherwise documented below in the visit note.  

## 2013-12-14 NOTE — Patient Instructions (Signed)
It is good to see you.  What we will do will be to get MRI We will get that scheduled and then will call you and [name] with the results Try the Pennsaid on your knee. 2 times daily. Depending on MRI we will discuss what to do next .

## 2013-12-14 NOTE — Assessment & Plan Note (Addendum)
Discussed at length about diagnosis, prognosis and different treatment options with patient as well as son today. Spent greater than 25 minutes with patient face-to-face and had greater than 50% of counseling including as described above in assessment and plan. After discussion we decided that an MRI to further evaluate patient's right knee would be beneficial. Patient only has mild osteoarthritic changes with a meniscal tear we would lean towards arthroscopic surgical repair. Patient has worsening osteoarthritic changes than what seen on x-ray we may need to consider more conservative therapy or we need to discuss arthroplasty. We'll wait for the MRI results call patient as well as son and discussed findings and further treatment options. In the interim patient was prescribed topical anti-inflammatories to try. Patient has had a history of ulcer previously.

## 2013-12-14 NOTE — Progress Notes (Signed)
CC: Knee pain follow up  HPI: Patient is a very pleasant 68 year old female coming in with bilateral knee pain. Patient was having more pain in the right knee. Patient did have an ultrasound that did show a large anterior lateral to posterior lateral degenerative meniscal tear. Patient was given a brace and home exercises previously. Patient did try over-the-counter medications that could be beneficial as well. Patient at last visit did have a steroid injection. The last 4 weeks she continue to do the home exercises. Patient states that she is only approximately 20% better. Patient is accompanied by his son who does state she's had some problems with getting up from a seated position seems to be the worst. Denies any new symptoms. Patient's pain though has been so bad she has used a cane recently.  Patient also did have x-rays on December 2 that shows minimal joint space narrowing.  Patient does have a past medical history significant for a left-sided meniscectomy as well as Baker cyst removal.  Past medical, surgical, family and social history reviewed. Medications reviewed all in the electronic medical record.   Review of Systems: No headache, visual changes, nausea, vomiting, diarrhea, constipation, dizziness, abdominal pain, skin rash, fevers, chills, night sweats, weight loss, swollen lymph nodes, body aches, joint swelling, muscle aches, chest pain, shortness of breath, mood changes.   Objective:    Blood pressure 120/74, pulse 81, temperature 97.6 F (36.4 C), temperature source Oral, resp. rate 16, weight 144 lb 1.3 oz (65.354 kg), SpO2 97.00%.   General: No apparent distress alert and oriented x3 mood and affect normal, dressed appropriately.  HEENT: Pupils equal, extraocular movements intact Respiratory: Patient's speak in full sentences and does not appear short of breath Cardiovascular: No lower extremity edema, non tender, no erythema Skin: Warm dry intact with no signs of  infection or rash on extremities or on axial skeleton. Abdomen: Soft nontender Neuro: Cranial nerves II through XII are intact, neurovascularly intact in all extremities with 2+ DTRs and 2+ pulses. Lymph: No lymphadenopathy of posterior or anterior cervical chain or axillae bilaterally.  Gait normal with good balance and coordination.  MSK: Non tender with full range of motion and good stability and symmetric strength and tone of shoulders, elbows, wrist, hip and ankles bilaterally.  Knee: Right Normal to inspection with no erythema or effusion or obvious bony abnormalities. Mild osteoarthritic changes bilaterally Patient is tender to palpation over the medial and lateral joint lines  ROM full in flexion and extension and lower leg rotation. Patient though does have pain with full extension in the posterior aspect of the leg. Ligaments with solid consistent endpoints including ACL, PCL, LCL, MCL. Positive Mcmurray's, Apley's, and Thessalonian tests. Positive painful patellar compression. Patellar glide with moderate crepitus. Patellar and quadriceps tendons unremarkable. Hamstring and quadriceps strength is normal. Patient does have significant tightness of the hamstrings bilaterally Contralateral knee has mild medial joint line tenderness otherwise unremarkable.  MSK US performed of:  This study was ordered, performed, and interpreted by Charlann Boxer D.O.  Knee: All structures visualized.  anterior lateral to posterior lateral meniscus does have a very large degenerative tear with mild displacement. Patient does have a mild hypoechoic changes surrounding the popliteus muscle. Mild fraying of the meniscus noted. Patient's posterior medial meniscus is significantly displaced which is a new finding from previous ultrasound. Patient does have jointline narrowing of the medial joint line. Patellar Tendon unremarkable on long and transverse views without effusion. No abnormality of prepatellar  bursa.  LCL and MCL unremarkable on long and transverse views. No abnormality of origin of medial or lateral head of the gastrocnemius.  IMPRESSION: Degenerative meniscal tear of the lateral meniscus of the right knee and displacement of medial meniscus but no definitive tear noted.   Impression and Recommendations:     This case required medical decision making of moderate complexity.

## 2013-12-27 ENCOUNTER — Other Ambulatory Visit: Payer: Medicare Other

## 2013-12-27 ENCOUNTER — Ambulatory Visit
Admission: RE | Admit: 2013-12-27 | Discharge: 2013-12-27 | Disposition: A | Discharge status: Home / Self Care | Payer: Medicare Other | Source: Ambulatory Visit | Attending: Family Medicine | Admitting: Family Medicine

## 2013-12-27 DIAGNOSIS — S83289A Other tear of lateral meniscus, current injury, unspecified knee, initial encounter: Secondary | ICD-10-CM

## 2014-01-07 ENCOUNTER — Other Ambulatory Visit: Payer: Self-pay | Admitting: Gastroenterology

## 2014-01-07 ENCOUNTER — Encounter: Payer: Self-pay | Admitting: Internal Medicine

## 2014-01-09 ENCOUNTER — Other Ambulatory Visit: Payer: Self-pay | Admitting: *Deleted

## 2014-01-09 MED ORDER — LOSARTAN POTASSIUM 25 MG PO TABS
ORAL_TABLET | ORAL | Status: DC
Start: 2014-01-09 — End: 2014-07-29

## 2014-01-09 NOTE — Telephone Encounter (Signed)
Sent email wanting a 90 day on her losartan...Johny Chess

## 2014-01-30 ENCOUNTER — Ambulatory Visit: Payer: Medicare Other | Admitting: Internal Medicine

## 2014-02-22 ENCOUNTER — Ambulatory Visit (INDEPENDENT_AMBULATORY_CARE_PROVIDER_SITE_OTHER): Payer: Medicare Other | Admitting: Family Medicine

## 2014-02-22 ENCOUNTER — Encounter: Payer: Self-pay | Admitting: Family Medicine

## 2014-02-22 VITALS — BP 126/78 | HR 86

## 2014-02-22 DIAGNOSIS — M1710 Unilateral primary osteoarthritis, unspecified knee: Secondary | ICD-10-CM

## 2014-02-22 MED ORDER — TRAMADOL HCL 50 MG PO TABS: 50.0000 mg | ORAL_TABLET | Freq: Every evening | ORAL | Status: DC | PRN

## 2014-02-22 NOTE — Assessment & Plan Note (Addendum)
Bilateral knee. Patient was injected both knees today Discussed icing and patient was given a brace today. Discussed  Home exercises and the importance of doing this the patient was given a refill of tramadol Patient will come back again on an as-needed basis. Patient does we can repeat the steroid injection every 3 months or he may need to start viscous supplementation in one month if it does not last. Patient also may be a surgical candidate of the right knee for an arthroscopic procedure with MRI findings.

## 2014-02-22 NOTE — Progress Notes (Signed)
CC: Knee pain follow up  HPI: Patient is a very pleasant 68 year old female coming in with bilateral knee pain. 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 Patient does have tricompartmental disease of his knee as well. Patient was responded very well to injections. Patient is now complaining more of her contralateral knee. Patient does have a trip coming up and would like to know if she can have another injection. Patient does state that she is having any new symptoms such as weakness but states that the pain is still mostly on the medial aspect of both knees.  Patient also did have x-rays on December 2 that shows minimal joint space narrowing.  Patient does have a past medical history significant for a left-sided meniscectomy as well as Baker cyst removal.  Past medical, surgical, family and social history reviewed. Medications reviewed all in the electronic medical record.   Review of Systems: No headache, visual changes, nausea, vomiting, diarrhea, constipation, dizziness, abdominal pain, skin rash, fevers, chills, night sweats, weight loss, swollen lymph nodes, body aches, joint swelling, muscle aches, chest pain, shortness of breath, mood changes.   Objective:    Blood pressure 126/78, pulse 86, SpO2 96.00%.   General: No apparent distress alert and oriented x3 mood and affect normal, dressed appropriately.  HEENT: Pupils equal, extraocular movements intact Respiratory: Patient's speak in full sentences and does not appear short of breath Cardiovascular: No lower extremity edema, non tender, no erythema Skin: Warm dry intact with no signs of infection or rash on extremities or on axial skeleton. Abdomen: Soft nontender Neuro: Cranial nerves II through XII are intact, neurovascularly intact in all extremities with 2+ DTRs and 2+ pulses. Lymph:  No lymphadenopathy of posterior or anterior cervical chain or axillae bilaterally.  Gait normal with good balance and coordination.  MSK: Non tender with full range of motion and good stability and symmetric strength and tone of shoulders, elbows, wrist, hip and ankles bilaterally.  Knee: Bilateral Normal to inspection with no erythema or effusion or obvious bony abnormalities. Mild osteoarthritic changes bilaterally Patient is tender to palpation over the medial and lateral joint lines  ROM full in flexion and extension and lower leg rotation. Patient though does have pain with full extension in the posterior aspect of the leg. Ligaments with solid consistent endpoints including ACL, PCL, LCL, MCL. Positive Mcmurray's, Apley's, and Thessalonian tests. Positive painful patellar compression. Patellar glide with moderate crepitus. Patellar and quadriceps tendons unremarkable. Hamstring and quadriceps strength is normal. Patient does have significant tightness of the hamstrings bilaterally Contralateral knee has mild medial joint line tenderness otherwise unremarkable.  After informed written and verbal consent, patient was seated on exam table. Right knee was prepped with alcohol swab and utilizing anterolateral approach, patient's right knee space was injected with 4:1  marcaine 0.5%: Kenalog 40mg /dL. Patient tolerated the procedure well without immediate complications.  After informed written and verbal consent, patient was seated on exam table. Left knee was prepped with alcohol swab and utilizing anterolateral approach, patient's left knee space was injected with 4:1  marcaine 0.5%: Kenalog 40mg /dL. Patient tolerated the procedure well without immediate complications.    Impression and Recommendations:     This case required medical decision making of moderate complexity.

## 2014-02-22 NOTE — Patient Instructions (Signed)
Good to see you Enjoy your trip Wear the brace with a lot of walking.  Ice 20 minutes 2 times a day and at end of long day.  Exercises 2-3 times a week.  Comeback again when you need me.

## 2014-02-27 ENCOUNTER — Other Ambulatory Visit: Payer: Self-pay | Admitting: Gastroenterology

## 2014-03-02 ENCOUNTER — Other Ambulatory Visit: Payer: Self-pay | Admitting: Gastroenterology

## 2014-04-04 ENCOUNTER — Ambulatory Visit: Payer: Medicare Other | Admitting: Internal Medicine

## 2014-04-13 ENCOUNTER — Encounter: Payer: Self-pay | Admitting: Internal Medicine

## 2014-04-13 ENCOUNTER — Ambulatory Visit (INDEPENDENT_AMBULATORY_CARE_PROVIDER_SITE_OTHER): Payer: Medicare Other | Admitting: Internal Medicine

## 2014-04-13 VITALS — BP 124/72 | HR 64 | Temp 98.5°F | Wt 139.4 lb

## 2014-04-13 DIAGNOSIS — M899 Disorder of bone, unspecified: Secondary | ICD-10-CM

## 2014-04-13 DIAGNOSIS — M1711 Unilateral primary osteoarthritis, right knee: Secondary | ICD-10-CM

## 2014-04-13 DIAGNOSIS — M949 Disorder of cartilage, unspecified: Secondary | ICD-10-CM

## 2014-04-13 DIAGNOSIS — I10 Essential (primary) hypertension: Secondary | ICD-10-CM

## 2014-04-13 DIAGNOSIS — M1710 Unilateral primary osteoarthritis, unspecified knee: Secondary | ICD-10-CM

## 2014-04-13 DIAGNOSIS — IMO0002 Reserved for concepts with insufficient information to code with codable children: Secondary | ICD-10-CM

## 2014-04-13 DIAGNOSIS — Z0000 Encounter for general adult medical examination without abnormal findings: Secondary | ICD-10-CM

## 2014-04-13 NOTE — Assessment & Plan Note (Signed)
changed Bbloc to 24h XR in 03/2011 at pt request to improve compliance - Also exacerbated by pain episodes -  Continue clonidine prn SBP>160 as previously prescribed Added ARB low dose 09/2013 - much improved The current medical regimen is effective;  continue present plan and medications.   BP Readings from Last 3 Encounters:  04/13/14 124/72  02/22/14 126/78  12/14/13 120/74

## 2014-04-13 NOTE — Assessment & Plan Note (Signed)
Previously on weekly Fosamax 2004 through October 2011 - stopped same because of GI reflux symptoms and esophageal stricture Recheck DEXA 12/2011 stable, due for 2 yr follow up now - reviewed with pt today  Advised continued calcium plus vitamin D supplements and weightbearing exercise

## 2014-04-13 NOTE — Assessment & Plan Note (Signed)
MRI knee February 2015 reviewed Short-term relief with corticosteroid injections in past 6 months reviewed Refer to orthopedic at this time for evaluation of Synvisc versus knee surgery

## 2014-04-13 NOTE — Progress Notes (Signed)
Pre visit review using our clinic review tool, if applicable. No additional management support is needed unless otherwise documented below in the visit note. 

## 2014-04-13 NOTE — Progress Notes (Signed)
Subjective:    Patient ID: [name] [name], female    DOB: 09-29-1946, 68 y.o.   MRN: [id number]  HPI   Here for medicare wellness  Diet: heart healthy or DM if diabetic Physical activity: sedentary Depression/mood screen: negative Hearing: intact to whispered voice Visual acuity: grossly normal, performs annual eye exam  ADLs: capable Fall risk: none Home safety: good Cognitive evaluation: intact to orientation, naming, recall and repetition EOL planning: adv directives, full code/ I agree  I have personally reviewed and have noted 1. The patient's medical and social history 2. Their use of alcohol, tobacco or illicit drugs 3. Their current medications and supplements 4. The patient's functional ability including ADL's, fall risks, home safety risks and hearing or visual impairment. 5. Diet and physical activities 6. Evidence for depression or mood disorders   Also reviewed chronic medical issues and interval medical events  Past Medical History  Diagnosis Date  . Migraine   . Hypertension   . GERD (gastroesophageal reflux disease) 11/08/2007, 04/12/2012  . IBS (irritable bowel syndrome)     diarrhea predom  . Hiatal hernia 11/08/2007, 04/12/2012  . 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 History of kidney stones   . Gastritis 11/08/2007, 04/12/2012    H pylori bx neg 03/2012  . Shingles outbreak 04/2013   Family History  Problem Relation Age of Onset  . Hypertension Mother   . Stroke Mother   . Colon cancer Neg Hx    History  Substance Use Topics  . Smoking status: Never Smoker   . Smokeless tobacco: Never Used  . Alcohol Use: No    Review of Systems  Constitutional: Negative for fatigue and unexpected weight change.  Respiratory: Negative for cough, shortness of breath and wheezing.   Cardiovascular: Negative for chest pain, palpitations and leg swelling.  Gastrointestinal: Negative for nausea, abdominal pain and diarrhea.   Musculoskeletal: Positive for arthralgias (R>L knee > 3 mo, short term improved with steroid) and joint swelling (knee). Negative for myalgias.  Neurological: Negative for dizziness, weakness, light-headedness and headaches.  Psychiatric/Behavioral: Negative for dysphoric mood. The patient is not nervous/anxious.   All other systems reviewed and are negative.      Objective:   Physical Exam  BP 124/72  Pulse 64  Temp(Src) 98.5 F (36.9 C) (Oral)  Wt 139 lb 6.4 oz (63.231 kg)  SpO2 96% Wt Readings from Last 3 Encounters:  04/13/14 139 lb 6.4 oz (63.231 kg)  12/14/13 144 lb 1.3 oz (65.354 kg)  10/25/13 144 lb (65.318 kg)   Constitutional: She appears well-developed and well-nourished. No distress.  spouse at side HENT: Head: Normocephalic and atraumatic. Ears: B TMs ok, no erythema or effusion; Nose: Nose normal. Mouth/Throat: Oropharynx is clear and moist. No oropharyngeal exudate.  Eyes: Conjunctivae and EOM are normal. Pupils are equal, round, and reactive to light. No scleral icterus.  Neck: Normal range of motion. Neck supple. No JVD present. No thyromegaly present.  Cardiovascular: Normal rate, regular rhythm and normal heart sounds.  No murmur heard. No BLE edema. Pulmonary/Chest: Effort normal and breath sounds normal. No respiratory distress. She has no wheezes.  Abdominal: Soft. Bowel sounds are normal. She exhibits no distension. There is no tenderness. no masses Musculoskeletal: R knee - boggy synovitis - tender to palpation over joint line; FROM and ligamentous function intact - otherwise, normal range of motion, no joint effusions. No gross deformities Neurological: She is alert and oriented to person, place, and  time. No cranial nerve deficit. Coordination, balance, strength, speech and gait are normal.  Skin: Skin is warm and dry. No rash noted. No erythema.  Psychiatric: She has a normal mood and affect. Her behavior is normal. Judgment and thought content normal.      Lab Results  Component Value Date   WBC 6.2 02/07/2013   HGB 14.3 02/07/2013   HCT 42.0 02/07/2013   PLT 228.0 02/07/2013   GLUCOSE 95 02/07/2013   CHOL 211* 02/07/2013   TRIG 128.0 02/07/2013   HDL 40.50 02/07/2013   LDLDIRECT 133.0 02/07/2013   LDLCALC 111* 01/21/2012   ALT 16 02/07/2013   AST 18 02/07/2013   NA 138 02/07/2013   K 4.1 02/07/2013   CL 103 02/07/2013   CREATININE 0.7 02/07/2013   BUN 16 02/07/2013   CO2 28 02/07/2013   TSH 1.07 02/07/2013    Mr Knee Right Wo Contrast  12/27/2013   CLINICAL DATA:  Anterior medial pain  EXAM: MRI OF THE RIGHT KNEE WITHOUT CONTRAST  TECHNIQUE: Multiplanar, multisequence MR imaging of the knee was performed. No intravenous contrast was administered.  COMPARISON:  None.  FINDINGS: MENISCI  Medial meniscus: There is an oblique tear of the posterior horn of the medial meniscus extending to the inferior articular surface. There is a radial tear of the posterior horn of the medial meniscus adjacent to the meniscal root with peripheral meniscal extrusion.  Lateral meniscus:  Intact.  LIGAMENTS  Cruciates:  Intact ACL and PCL.  Collaterals: Medial collateral ligament is intact. Lateral collateral ligament complex is intact.  CARTILAGE  Patellofemoral: There is partial thickness cartilage loss of the medial and lateral patellar facets with mild subchondral reactive changes. There is near full-thickness cartilage loss of the medial and lateral trochlea.  Medial: There is high-grade partial-thickness cartilage loss of the medial femoral condyle and medial tibial plateau.  Lateral:  No focal chondral defect.  Mild chondromalacia.  Joint: No joint effusion. Normal Hoffa's fat. No plical thickening.  Popliteal Fossa:  Small Baker's cyst.  Intact popliteus tendon.  Extensor Mechanism:  Intact.  Bones:  No other focal marrow signal abnormality.  IMPRESSION: 1. Radial tear of the posterior horn of the medial meniscus adjacent to the meniscal root with peripheral meniscal  extrusion. Oblique tear of the posterior horn of the medial meniscus extending to the inferior articular surface. 2. Tricompartmental cartilage abnormalities as described above most severe in the patellofemoral compartment and medial femorotibial compartment. 3. Small Baker's cyst.   Electronically Signed   By: Kathreen Devoid   On: 12/27/2013 14:48       Assessment & Plan:   CPX/v70.0 - Today patient counseled on age appropriate routine health concerns for screening and prevention, each reviewed and up to date or declined. Immunizations reviewed and up to date or declined. Labs/ECG reviewed. Risk factors for depression reviewed and negative. Hearing function and visual acuity are intact. ADLs screened and addressed as needed. Functional ability and level of safety reviewed and appropriate. Education, counseling and referrals performed based on assessed risks today. Patient provided with a copy of personalized plan for preventive services.  Problem List Items Addressed This Visit   HYPERTENSION      changed Bbloc to 24h XR in 03/2011 at pt request to improve compliance - Also exacerbated by pain episodes -  Continue clonidine prn SBP>160 as previously prescribed Added ARB low dose 09/2013 - much improved The current medical regimen is effective;  continue present plan and medications.  BP Readings from Last 3 Encounters:  04/13/14 124/72  02/22/14 126/78  12/14/13 120/74      Osteoarthritis of right knee     MRI knee February 2015 reviewed Short-term relief with corticosteroid injections in past 6 months reviewed Refer to orthopedic at this time for evaluation of Synvisc versus knee surgery    Relevant Orders      Ambulatory referral to Orthopedic Surgery   OSTEOPENIA     Previously on weekly Fosamax 2004 through October 2011 - stopped same because of GI reflux symptoms and esophageal stricture Recheck DEXA 12/2011 stable, due for 2 yr follow up now - reviewed with pt today  Advised  continued calcium plus vitamin D supplements and weightbearing exercise    Relevant Orders      DG Bone Density    Other Visit Diagnoses   Routine general medical examination at a health care facility    -  Primary    Relevant Orders       Basic metabolic panel       CBC with Differential       Hepatic function panel       Lipid panel       Urinalysis, Routine w reflex microscopic

## 2014-04-13 NOTE — Patient Instructions (Addendum)
It was good to see you today.  We have reviewed your prior records including labs and tests today  Health Maintenance reviewed - all recommended immunizations and age-appropriate screenings are up-to-date.  Test(s) ordered today. Return next week when you're fasting for labs and bone density as scheduled -Your results will be released to MyChart (or called to you) after review, usually within 72hours after test completion. If any changes need to be made, you will be notified at that same time.  Medications reviewed and updated, no changes recommended at this time.  we'll make referral to orthopedic knee specialist for evaluation of potential surgical options. Our office will contact you regarding appointment(s) once made.  Please schedule followup in 12 months for annual exam and labs, call sooner if problems.  Health Maintenance, Female A healthy lifestyle and preventative care can promote health and wellness.  Maintain regular health, dental, and eye exams.  Eat a healthy diet. Foods like vegetables, fruits, whole grains, low-fat dairy products, and lean protein foods contain the nutrients you need without too many calories. Decrease your intake of foods high in solid fats, added sugars, and salt. Get information about a proper diet from your caregiver, if necessary.  Regular physical exercise is one of the most important things you can do for your health. Most adults should get at least 150 minutes of moderate-intensity exercise (any activity that increases your heart rate and causes you to sweat) each week. In addition, most adults need muscle-strengthening exercises on 2 or more days a week.   Maintain a healthy weight. The body mass index (BMI) is a screening tool to identify possible weight problems. It provides an estimate of body fat based on height and weight. Your caregiver can help determine your BMI, and can help you achieve or maintain a healthy weight. For adults 20 years and  older:  A BMI below 18.5 is considered underweight.  A BMI of 18.5 to 24.9 is normal.  A BMI of 25 to 29.9 is considered overweight.  A BMI of 30 and above is considered obese.  Maintain normal blood lipids and cholesterol by exercising and minimizing your intake of saturated fat. Eat a balanced diet with plenty of fruits and vegetables. Blood tests for lipids and cholesterol should begin at age 66 and be repeated every 5 years. If your lipid or cholesterol levels are high, you are over 50, or you are a high risk for heart disease, you may need your cholesterol levels checked more frequently.Ongoing high lipid and cholesterol levels should be treated with medicines if diet and exercise are not effective.  If you smoke, find out from your caregiver how to quit. If you do not use tobacco, do not start.  Lung cancer screening is recommended for adults aged 69 80 years who are at high risk for developing lung cancer because of a history of smoking. Yearly low-dose computed tomography (CT) is recommended for people who have at least a 30-pack-year history of smoking and are a current smoker or have quit within the past 15 years. A pack year of smoking is smoking an average of 1 pack of cigarettes a day for 1 year (for example: 1 pack a day for 30 years or 2 packs a day for 15 years). Yearly screening should continue until the smoker has stopped smoking for at least 15 years. Yearly screening should also be stopped for people who develop a health problem that would prevent them from having lung cancer treatment.  If  you are pregnant, do not drink alcohol. If you are breastfeeding, be very cautious about drinking alcohol. If you are not pregnant and choose to drink alcohol, do not exceed 1 drink per day. One drink is considered to be 12 ounces (355 mL) of beer, 5 ounces (148 mL) of wine, or 1.5 ounces (44 mL) of liquor.  Avoid use of street drugs. Do not share needles with anyone. Ask for help if you  need support or instructions about stopping the use of drugs.  High blood pressure causes heart disease and increases the risk of stroke. Blood pressure should be checked at least every 1 to 2 years. Ongoing high blood pressure should be treated with medicines, if weight loss and exercise are not effective.  If you are 72 to 69 years old, ask your caregiver if you should take aspirin to prevent strokes.  Diabetes screening involves taking a blood sample to check your fasting blood sugar level. This should be done once every 3 years, after age 51, if you are within normal weight and without risk factors for diabetes. Testing should be considered at a younger age or be carried out more frequently if you are overweight and have at least 1 risk factor for diabetes.  Breast cancer screening is essential preventative care for women. You should practice "breast self-awareness." This means understanding the normal appearance and feel of your breasts and may include breast self-examination. Any changes detected, no matter how small, should be reported to a caregiver. Women in their 51s and 30s should have a clinical breast exam (CBE) by a caregiver as part of a regular health exam every 1 to 3 years. After age 58, women should have a CBE every year. Starting at age 49, women should consider having a mammogram (breast X-ray) every year. Women who have a family history of breast cancer should talk to their caregiver about genetic screening. Women at a high risk of breast cancer should talk to their caregiver about having an MRI and a mammogram every year.  Breast cancer gene (BRCA)-related cancer risk assessment is recommended for women who have family members with BRCA-related cancers. BRCA-related cancers include breast, ovarian, tubal, and peritoneal cancers. Having family members with these cancers may be associated with an increased risk for harmful changes (mutations) in the breast cancer genes BRCA1 and BRCA2.  Results of the assessment will determine the need for genetic counseling and BRCA1 and BRCA2 testing.  The Pap test is a screening test for cervical cancer. Women should have a Pap test starting at age 35. Between ages 67 and 12, Pap tests should be repeated every 2 years. Beginning at age 2, you should have a Pap test every 3 years as long as the past 3 Pap tests have been normal. If you had a hysterectomy for a problem that was not cancer or a condition that could lead to cancer, then you no longer need Pap tests. If you are between ages 25 and 30, and you have had normal Pap tests going back 10 years, you no longer need Pap tests. If you have had past treatment for cervical cancer or a condition that could lead to cancer, you need Pap tests and screening for cancer for at least 20 years after your treatment. If Pap tests have been discontinued, risk factors (such as a new sexual partner) need to be reassessed to determine if screening should be resumed. Some women have medical problems that increase the chance of getting cervical cancer.  In these cases, your caregiver may recommend more frequent screening and Pap tests.  The human papillomavirus (HPV) test is an additional test that may be used for cervical cancer screening. The HPV test looks for the virus that can cause the cell changes on the cervix. The cells collected during the Pap test can be tested for HPV. The HPV test could be used to screen women aged 59 years and older, and should be used in women of any age who have unclear Pap test results. After the age of 80, women should have HPV testing at the same frequency as a Pap test.  Colorectal cancer can be detected and often prevented. Most routine colorectal cancer screening begins at the age of 71 and continues through age 9. However, your caregiver may recommend screening at an earlier age if you have risk factors for colon cancer. On a yearly basis, your caregiver may provide home test kits  to check for hidden blood in the stool. Use of a small camera at the end of a tube, to directly examine the colon (sigmoidoscopy or colonoscopy), can detect the earliest forms of colorectal cancer. Talk to your caregiver about this at age 28, when routine screening begins. Direct examination of the colon should be repeated every 5 to 10 years through age 35, unless early forms of pre-cancerous polyps or small growths are found.  Hepatitis C blood testing is recommended for all people born from [birth year] through 1966 and any individual with known risks for hepatitis C.  Practice safe sex. Use condoms and avoid high-risk sexual practices to reduce the spread of sexually transmitted infections (STIs). Sexually active women aged 54 and younger should be checked for Chlamydia, which is a common sexually transmitted infection. Older women with new or multiple partners should also be tested for Chlamydia. Testing for other STIs is recommended if you are sexually active and at increased risk.  Osteoporosis is a disease in which the bones lose minerals and strength with aging. This can result in serious bone fractures. The risk of osteoporosis can be identified using a bone density scan. Women ages 20 and over and women at risk for fractures or osteoporosis should discuss screening with their caregivers. Ask your caregiver whether you should be taking a calcium supplement or vitamin D to reduce the rate of osteoporosis.  Menopause can be associated with physical symptoms and risks. Hormone replacement therapy is available to decrease symptoms and risks. You should talk to your caregiver about whether hormone replacement therapy is right for you.  Use sunscreen. Apply sunscreen liberally and repeatedly throughout the day. You should seek shade when your shadow is shorter than you. Protect yourself by wearing long sleeves, pants, a wide-brimmed hat, and sunglasses year round, whenever you are outdoors.  Notify your  caregiver of new moles or changes in moles, especially if there is a change in shape or color. Also notify your caregiver if a mole is larger than the size of a pencil eraser.  Stay current with your immunizations. Document Released: 05/26/2011 Document Revised: 03/07/2013 Document Reviewed: 05/26/2011 [facility] Patient Information 2014 [place]. Health Maintenance, Female A healthy lifestyle and preventative care can promote health and wellness.  Maintain regular health, dental, and eye exams.  Eat a healthy diet. Foods like vegetables, fruits, whole grains, low-fat dairy products, and lean protein foods contain the nutrients you need without too many calories. Decrease your intake of foods high in solid fats, added sugars, and salt. Get information about a  proper diet from your caregiver, if necessary.  Regular physical exercise is one of the most important things you can do for your health. Most adults should get at least 150 minutes of moderate-intensity exercise (any activity that increases your heart rate and causes you to sweat) each week. In addition, most adults need muscle-strengthening exercises on 2 or more days a week.   Maintain a healthy weight. The body mass index (BMI) is a screening tool to identify possible weight problems. It provides an estimate of body fat based on height and weight. Your caregiver can help determine your BMI, and can help you achieve or maintain a healthy weight. For adults 20 years and older:  A BMI below 18.5 is considered underweight.  A BMI of 18.5 to 24.9 is normal.  A BMI of 25 to 29.9 is considered overweight.  A BMI of 30 and above is considered obese.  Maintain normal blood lipids and cholesterol by exercising and minimizing your intake of saturated fat. Eat a balanced diet with plenty of fruits and vegetables. Blood tests for lipids and cholesterol should begin at age 61 and be repeated every 5 years. If your lipid or cholesterol levels  are high, you are over 50, or you are a high risk for heart disease, you may need your cholesterol levels checked more frequently.Ongoing high lipid and cholesterol levels should be treated with medicines if diet and exercise are not effective.  If you smoke, find out from your caregiver how to quit. If you do not use tobacco, do not start.  Lung cancer screening is recommended for adults aged 46 80 years who are at high risk for developing lung cancer because of a history of smoking. Yearly low-dose computed tomography (CT) is recommended for people who have at least a 30-pack-year history of smoking and are a current smoker or have quit within the past 15 years. A pack year of smoking is smoking an average of 1 pack of cigarettes a day for 1 year (for example: 1 pack a day for 30 years or 2 packs a day for 15 years). Yearly screening should continue until the smoker has stopped smoking for at least 15 years. Yearly screening should also be stopped for people who develop a health problem that would prevent them from having lung cancer treatment.  If you are pregnant, do not drink alcohol. If you are breastfeeding, be very cautious about drinking alcohol. If you are not pregnant and choose to drink alcohol, do not exceed 1 drink per day. One drink is considered to be 12 ounces (355 mL) of beer, 5 ounces (148 mL) of wine, or 1.5 ounces (44 mL) of liquor.  Avoid use of street drugs. Do not share needles with anyone. Ask for help if you need support or instructions about stopping the use of drugs.  High blood pressure causes heart disease and increases the risk of stroke. Blood pressure should be checked at least every 1 to 2 years. Ongoing high blood pressure should be treated with medicines, if weight loss and exercise are not effective.  If you are 7 to 69 years old, ask your caregiver if you should take aspirin to prevent strokes.  Diabetes screening involves taking a blood sample to check your  fasting blood sugar level. This should be done once every 3 years, after age 22, if you are within normal weight and without risk factors for diabetes. Testing should be considered at a younger age or be carried out more  frequently if you are overweight and have at least 1 risk factor for diabetes.  Breast cancer screening is essential preventative care for women. You should practice "breast self-awareness." This means understanding the normal appearance and feel of your breasts and may include breast self-examination. Any changes detected, no matter how small, should be reported to a caregiver. Women in their 60s and 30s should have a clinical breast exam (CBE) by a caregiver as part of a regular health exam every 1 to 3 years. After age 4, women should have a CBE every year. Starting at age 46, women should consider having a mammogram (breast X-ray) every year. Women who have a family history of breast cancer should talk to their caregiver about genetic screening. Women at a high risk of breast cancer should talk to their caregiver about having an MRI and a mammogram every year.  Breast cancer gene (BRCA)-related cancer risk assessment is recommended for women who have family members with BRCA-related cancers. BRCA-related cancers include breast, ovarian, tubal, and peritoneal cancers. Having family members with these cancers may be associated with an increased risk for harmful changes (mutations) in the breast cancer genes BRCA1 and BRCA2. Results of the assessment will determine the need for genetic counseling and BRCA1 and BRCA2 testing.  The Pap test is a screening test for cervical cancer. Women should have a Pap test starting at age 49. Between ages 17 and 72, Pap tests should be repeated every 2 years. Beginning at age 19, you should have a Pap test every 3 years as long as the past 3 Pap tests have been normal. If you had a hysterectomy for a problem that was not cancer or a condition that could  lead to cancer, then you no longer need Pap tests. If you are between ages 25 and 22, and you have had normal Pap tests going back 10 years, you no longer need Pap tests. If you have had past treatment for cervical cancer or a condition that could lead to cancer, you need Pap tests and screening for cancer for at least 20 years after your treatment. If Pap tests have been discontinued, risk factors (such as a new sexual partner) need to be reassessed to determine if screening should be resumed. Some women have medical problems that increase the chance of getting cervical cancer. In these cases, your caregiver may recommend more frequent screening and Pap tests.  The human papillomavirus (HPV) test is an additional test that may be used for cervical cancer screening. The HPV test looks for the virus that can cause the cell changes on the cervix. The cells collected during the Pap test can be tested for HPV. The HPV test could be used to screen women aged 75 years and older, and should be used in women of any age who have unclear Pap test results. After the age of 1, women should have HPV testing at the same frequency as a Pap test.  Colorectal cancer can be detected and often prevented. Most routine colorectal cancer screening begins at the age of 21 and continues through age 50. However, your caregiver may recommend screening at an earlier age if you have risk factors for colon cancer. On a yearly basis, your caregiver may provide home test kits to check for hidden blood in the stool. Use of a small camera at the end of a tube, to directly examine the colon (sigmoidoscopy or colonoscopy), can detect the earliest forms of colorectal cancer. Talk to your caregiver about  this at age 78, when routine screening begins. Direct examination of the colon should be repeated every 5 to 10 years through age 42, unless early forms of pre-cancerous polyps or small growths are found.  Hepatitis C blood testing is  recommended for all people born from [birth year] through 1966 and any individual with known risks for hepatitis C.  Practice safe sex. Use condoms and avoid high-risk sexual practices to reduce the spread of sexually transmitted infections (STIs). Sexually active women aged 35 and younger should be checked for Chlamydia, which is a common sexually transmitted infection. Older women with new or multiple partners should also be tested for Chlamydia. Testing for other STIs is recommended if you are sexually active and at increased risk.  Osteoporosis is a disease in which the bones lose minerals and strength with aging. This can result in serious bone fractures. The risk of osteoporosis can be identified using a bone density scan. Women ages 32 and over and women at risk for fractures or osteoporosis should discuss screening with their caregivers. Ask your caregiver whether you should be taking a calcium supplement or vitamin D to reduce the rate of osteoporosis.  Menopause can be associated with physical symptoms and risks. Hormone replacement therapy is available to decrease symptoms and risks. You should talk to your caregiver about whether hormone replacement therapy is right for you.  Use sunscreen. Apply sunscreen liberally and repeatedly throughout the day. You should seek shade when your shadow is shorter than you. Protect yourself by wearing long sleeves, pants, a wide-brimmed hat, and sunglasses year round, whenever you are outdoors.  Notify your caregiver of new moles or changes in moles, especially if there is a change in shape or color. Also notify your caregiver if a mole is larger than the size of a pencil eraser.  Stay current with your immunizations. Document Released: 05/26/2011 Document Revised: 03/07/2013 Document Reviewed: 05/26/2011 [facility] Patient Information 2014 [place].

## 2014-04-18 ENCOUNTER — Ambulatory Visit (INDEPENDENT_AMBULATORY_CARE_PROVIDER_SITE_OTHER)
Admission: RE | Admit: 2014-04-18 | Discharge: 2014-04-18 | Disposition: A | Discharge status: Home / Self Care | Payer: Medicare Other | Source: Ambulatory Visit | Attending: Internal Medicine | Admitting: Internal Medicine

## 2014-04-18 ENCOUNTER — Other Ambulatory Visit (INDEPENDENT_AMBULATORY_CARE_PROVIDER_SITE_OTHER): Payer: Medicare Other

## 2014-04-18 DIAGNOSIS — M949 Disorder of cartilage, unspecified: Principal | ICD-10-CM

## 2014-04-18 DIAGNOSIS — I10 Essential (primary) hypertension: Secondary | ICD-10-CM

## 2014-04-18 DIAGNOSIS — Z0000 Encounter for general adult medical examination without abnormal findings: Secondary | ICD-10-CM

## 2014-04-18 DIAGNOSIS — M899 Disorder of bone, unspecified: Secondary | ICD-10-CM

## 2014-04-18 LAB — CBC WITH DIFFERENTIAL/PLATELET
BASOS PCT: 0.7 % (ref 0.0–3.0)
Basophils Absolute: 0.1 10*3/uL (ref 0.0–0.1)
Eosinophils Absolute: 0.1 10*3/uL (ref 0.0–0.7)
Eosinophils Relative: 1.6 % (ref 0.0–5.0)
HCT: 42.4 % (ref 36.0–46.0)
HEMOGLOBIN: 14.3 g/dL (ref 12.0–15.0)
LYMPHS ABS: 2.2 10*3/uL (ref 0.7–4.0)
Lymphocytes Relative: 27.8 % (ref 12.0–46.0)
MCHC: 33.7 g/dL (ref 30.0–36.0)
MCV: 97.2 fl (ref 78.0–100.0)
MONOS PCT: 8.3 % (ref 3.0–12.0)
Monocytes Absolute: 0.6 10*3/uL (ref 0.1–1.0)
Neutro Abs: 4.8 10*3/uL (ref 1.4–7.7)
Neutrophils Relative %: 61.6 % (ref 43.0–77.0)
PLATELETS: 244 10*3/uL (ref 150.0–400.0)
RBC: 4.36 Mil/uL (ref 3.87–5.11)
RDW: 13.7 % (ref 11.5–15.5)
WBC: 7.8 10*3/uL (ref 4.0–10.5)

## 2014-04-18 LAB — URINALYSIS, ROUTINE W REFLEX MICROSCOPIC
Bilirubin Urine: NEGATIVE
KETONES UR: NEGATIVE
NITRITE: NEGATIVE
PH: 6.5 (ref 5.0–8.0)
SPECIFIC GRAVITY, URINE: 1.015 (ref 1.000–1.030)
Total Protein, Urine: NEGATIVE
UROBILINOGEN UA: 0.2 (ref 0.0–1.0)
Urine Glucose: NEGATIVE

## 2014-04-18 LAB — BASIC METABOLIC PANEL
BUN: 17 mg/dL (ref 6–23)
CO2: 32 mEq/L (ref 19–32)
Calcium: 9.3 mg/dL (ref 8.4–10.5)
Chloride: 105 mEq/L (ref 96–112)
Creatinine, Ser: 0.7 mg/dL (ref 0.4–1.2)
GFR: 82.74 mL/min (ref >60.00)
Glucose, Bld: 91 mg/dL (ref 70–99)
POTASSIUM: 4.1 meq/L (ref 3.5–5.1)
Sodium: 141 mEq/L (ref 135–145)

## 2014-04-18 LAB — LIPID PANEL
CHOL/HDL RATIO: 4
Cholesterol: 210 mg/dL — ABNORMAL HIGH (ref 0–200)
HDL: 46.7 mg/dL (ref >39.00)
LDL CALC: 141 mg/dL — AB (ref 0–99)
Triglycerides: 110 mg/dL (ref 0.0–149.0)
VLDL: 22 mg/dL (ref 0.0–40.0)

## 2014-04-18 LAB — HEPATIC FUNCTION PANEL
ALT: 13 U/L (ref 0–35)
AST: 14 U/L (ref 0–37)
Albumin: 3.6 g/dL (ref 3.5–5.2)
Alkaline Phosphatase: 49 U/L (ref 39–117)
BILIRUBIN TOTAL: 0.8 mg/dL (ref 0.2–1.2)
Bilirubin, Direct: 0.1 mg/dL (ref 0.0–0.3)
TOTAL PROTEIN: 6.3 g/dL (ref 6.0–8.3)

## 2014-04-25 ENCOUNTER — Encounter: Payer: Self-pay | Admitting: Internal Medicine

## 2014-04-25 ENCOUNTER — Telehealth: Payer: Self-pay | Admitting: *Deleted

## 2014-04-25 NOTE — Telephone Encounter (Signed)
Dr. Asa Lente is wanting pt to get set-up for prolia injections. Let me know once insurance has been verified, and I will contact pt...[name] [name]

## 2014-04-27 NOTE — Telephone Encounter (Signed)
Sent insurance info to [employer] for verification.  I will notify you once I have a response. Thank you.

## 2014-05-03 ENCOUNTER — Encounter: Payer: Self-pay | Admitting: Family Medicine

## 2014-05-03 ENCOUNTER — Ambulatory Visit (INDEPENDENT_AMBULATORY_CARE_PROVIDER_SITE_OTHER): Payer: Medicare Other | Admitting: Family Medicine

## 2014-05-03 VITALS — BP 130/72 | HR 76 | Ht 62.0 in | Wt 139.0 lb

## 2014-05-03 DIAGNOSIS — M1710 Unilateral primary osteoarthritis, unspecified knee: Secondary | ICD-10-CM

## 2014-05-03 NOTE — Progress Notes (Signed)
CC: Knee pain follow up  HPI: Patient is a very pleasant 68 year old female coming in with bilateral knee pain. 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 Patient does wondering if she should do the same on the left side because she started to have worsening pain of the left. No further imaging other than x-rays have been done. Patient wanted no if we can possibly do an ultrasound to evaluate further.  Patient also did have x-rays on December 2 that shows minimal joint space narrowing.  Patient does have a past medical history significant for a left-sided meniscectomy as well as Baker cyst removal.  Past medical, surgical, family and social history reviewed. Medications reviewed all in the electronic medical record.   Review of Systems: No headache, visual changes, nausea, vomiting, diarrhea, constipation, dizziness, abdominal pain, skin rash, fevers, chills, night sweats, weight loss, swollen lymph nodes, body aches, joint swelling, muscle aches, chest pain, shortness of breath, mood changes.   Objective:    Blood pressure 130/72, pulse 76, height 5\' 2"  (1.575 m), weight 139 lb (63.05 kg), SpO2 97.00%.   General: No apparent distress alert and oriented x3 mood and affect normal, dressed appropriately.  HEENT: Pupils equal, extraocular movements intact Respiratory: Patient's speak in full sentences and does not appear short of breath Cardiovascular: No lower extremity edema, non tender, no erythema Skin: Warm dry intact with no signs of infection or rash on extremities or on axial skeleton. Abdomen: Soft nontender Neuro: Cranial nerves II through XII are intact, neurovascularly intact in all extremities with 2+ DTRs and 2+ pulses. Lymph: No lymphadenopathy of posterior or anterior  cervical chain or axillae bilaterally.  Gait normal with good balance and coordination.  MSK: Non tender with full range of motion and good stability and symmetric strength and tone of shoulders, elbows, wrist, hip and ankles bilaterally.  Knee: Bilateral Normal to inspection with no erythema or effusion or obvious bony abnormalities. Patient is tender to palpation over the medial joint lines  ROM full in flexion and extension and lower leg rotation. Patient though does have pain with full extension in the posterior aspect of the leg. Ligaments with solid consistent endpoints including ACL, PCL, LCL, MCL. Positive Mcmurray's, Apley's, and Thessalonian tests. Positive painful patellar compression. Patellar glide with moderate crepitus. Patellar and quadriceps tendons unremarkable. Hamstring and quadriceps strength is normal. Patient does have significant tightness of the hamstrings bilaterally Contralateral knee has mild medial joint line tenderness otherwise unremarkable.  MSK US performed of: Left This study was ordered, performed, and interpreted by Charlann Boxer D.O.  Knee: All structures visualized. Anteromedial does have what appears to be an chronic tear. Patient's posterior medial meniscus has been removed. anterolateral, and posterolateral menisci unremarkable without tearing, fraying, effusion, or displacement. Patellar Tendon unremarkable on long and transverse views without effusion. No abnormality of prepatellar bursa. LCL and MCL unremarkable on long and transverse views. No abnormality of origin of medial or lateral head of the gastrocnemius. Patient though has significant or joint space than compared to the contralateral side the  IMPRESSION: Mild chronic meniscal tear     Impression and Recommendations:     This case required medical decision making of moderate complexity.

## 2014-05-03 NOTE — Patient Instructions (Signed)
Good to see you Your left knee looks much better then the right knee.  I would recommend 1 knee at a time. You are in good hand with Dr. Rhona Raider.  I am here if you need me.

## 2014-05-03 NOTE — Assessment & Plan Note (Signed)
Discussed with patient as well as her well-informed son. Patient is already scheduled next month to have arthroscopic surgery to the right knee. On ultrasound exam on physical exam the left knee is in better repair then the contralateral knee. We discussed the possibility of doing a bilateral arthroscopic exam versus unilateral right. We came to the conclusion that only the right leg needs to have arthroscopic surgery and hopefully that the pain that she is having in the left knee is secondary to compensation. Patient also knows that if having surgery she should wait at least 3-6 months after the initial surgery. Patient will come back again on an as-needed basis.

## 2014-05-09 NOTE — Telephone Encounter (Signed)
Called pt gave her response below concerning prolia. Pt is wanting to ask md is there anything else she recommends that will be affective. Her sister take boniva once a month. Do you think she can take that.Marland KitchenJohny Chess

## 2014-05-09 NOTE — Telephone Encounter (Signed)
I have rec'd pt's insurance verification from [employer]. With an office visit pt is responsible for $25 copay PLUS 20% of Prolia/admin which is approximately $205; without an office visit pt is responsible for $20 copay PLUS 20% of Prolia/admin, which is approximately $200. Please advise pt this is only an estimate. I have sent a copy of the response to be scanned into pt's chart. Please let me know if you have any questions. Thank you.

## 2014-05-10 ENCOUNTER — Telehealth: Payer: Self-pay | Admitting: Internal Medicine

## 2014-05-10 MED ORDER — IBANDRONATE SODIUM 150 MG PO TABS: 150.0000 mg | ORAL_TABLET | ORAL | Status: DC

## 2014-05-10 NOTE — Telephone Encounter (Signed)
Called pt no answer LMOM (H) with md response [facility].../lmb

## 2014-05-10 NOTE — Telephone Encounter (Signed)
Ok for boniva each mo - please send rx thanks

## 2014-05-17 ENCOUNTER — Other Ambulatory Visit: Payer: Self-pay | Admitting: Internal Medicine

## 2014-07-29 ENCOUNTER — Other Ambulatory Visit: Payer: Self-pay | Admitting: Internal Medicine

## 2014-08-02 ENCOUNTER — Other Ambulatory Visit: Payer: Self-pay

## 2014-08-02 MED ORDER — LOSARTAN POTASSIUM 25 MG PO TABS: ORAL_TABLET | ORAL | Status: DC

## 2014-11-08 ENCOUNTER — Other Ambulatory Visit: Payer: Self-pay | Admitting: Internal Medicine

## 2015-01-11 ENCOUNTER — Other Ambulatory Visit: Payer: Self-pay | Admitting: Orthopaedic Surgery

## 2015-01-11 DIAGNOSIS — M25562 Pain in left knee: Secondary | ICD-10-CM

## 2015-01-23 ENCOUNTER — Ambulatory Visit
Admission: RE | Admit: 2015-01-23 | Discharge: 2015-01-23 | Disposition: A | Discharge status: Home / Self Care | Payer: Medicare Other | Source: Ambulatory Visit | Attending: Orthopaedic Surgery | Admitting: Orthopaedic Surgery

## 2015-01-23 DIAGNOSIS — M25562 Pain in left knee: Secondary | ICD-10-CM

## 2015-01-25 ENCOUNTER — Other Ambulatory Visit (INDEPENDENT_AMBULATORY_CARE_PROVIDER_SITE_OTHER): Payer: Medicare Other

## 2015-01-25 ENCOUNTER — Other Ambulatory Visit: Payer: Medicare Other

## 2015-01-25 ENCOUNTER — Ambulatory Visit (INDEPENDENT_AMBULATORY_CARE_PROVIDER_SITE_OTHER)
Admission: RE | Admit: 2015-01-25 | Discharge: 2015-01-25 | Disposition: A | Discharge status: Home / Self Care | Payer: Medicare Other | Source: Ambulatory Visit | Attending: Internal Medicine | Admitting: Internal Medicine

## 2015-01-25 ENCOUNTER — Ambulatory Visit (INDEPENDENT_AMBULATORY_CARE_PROVIDER_SITE_OTHER): Payer: Medicare Other | Admitting: Internal Medicine

## 2015-01-25 ENCOUNTER — Encounter: Payer: Self-pay | Admitting: Internal Medicine

## 2015-01-25 VITALS — BP 128/72 | HR 59 | Temp 97.6°F | Resp 17 | Ht 62.0 in | Wt 145.0 lb

## 2015-01-25 DIAGNOSIS — M79661 Pain in right lower leg: Secondary | ICD-10-CM

## 2015-01-25 DIAGNOSIS — K210 Gastro-esophageal reflux disease with esophagitis: Secondary | ICD-10-CM

## 2015-01-25 DIAGNOSIS — R0789 Other chest pain: Secondary | ICD-10-CM

## 2015-01-25 DIAGNOSIS — I499 Cardiac arrhythmia, unspecified: Secondary | ICD-10-CM

## 2015-01-25 LAB — CBC WITH DIFFERENTIAL/PLATELET
BASOS ABS: 0 10*3/uL (ref 0.0–0.1)
BASOS PCT: 0.4 % (ref 0.0–3.0)
Eosinophils Absolute: 0.1 10*3/uL (ref 0.0–0.7)
Eosinophils Relative: 1.2 % (ref 0.0–5.0)
HCT: 43.5 % (ref 36.0–46.0)
HEMOGLOBIN: 14.8 g/dL (ref 12.0–15.0)
LYMPHS PCT: 25 % (ref 12.0–46.0)
Lymphs Abs: 2.3 10*3/uL (ref 0.7–4.0)
MCHC: 34 g/dL (ref 30.0–36.0)
MCV: 94.4 fl (ref 78.0–100.0)
MONOS PCT: 8.2 % (ref 3.0–12.0)
Monocytes Absolute: 0.7 10*3/uL (ref 0.1–1.0)
NEUTROS ABS: 5.9 10*3/uL (ref 1.4–7.7)
NEUTROS PCT: 65.2 % (ref 43.0–77.0)
Platelets: 245 10*3/uL (ref 150.0–400.0)
RBC: 4.61 Mil/uL (ref 3.87–5.11)
RDW: 13.8 % (ref 11.5–15.5)
WBC: 9.1 10*3/uL (ref 4.0–10.5)

## 2015-01-25 LAB — BASIC METABOLIC PANEL
BUN: 19 mg/dL (ref 6–23)
CALCIUM: 9.7 mg/dL (ref 8.4–10.5)
CHLORIDE: 104 meq/L (ref 96–112)
CO2: 33 meq/L — AB (ref 19–32)
Creatinine, Ser: 0.86 mg/dL (ref 0.40–1.20)
GFR: 69.41 mL/min (ref >60.00)
GLUCOSE: 95 mg/dL (ref 70–99)
Potassium: 4.4 mEq/L (ref 3.5–5.1)
Sodium: 141 mEq/L (ref 135–145)

## 2015-01-25 LAB — TSH: TSH: 0.77 u[IU]/mL (ref 0.35–4.50)

## 2015-01-25 LAB — TROPONIN I: TNIDX: 0 ug/l (ref 0.00–0.06)

## 2015-01-25 NOTE — Patient Instructions (Addendum)
  Your next office appointment will be determined based upon review of your pending labs &  x-rays. Those instructions will be transmitted to you through My Chart   Critical values will be called. Followup as needed for any active or acute issue. Please report any significant change in your symptoms. Please start Xarelto 15 mg twice a day ( Lot [id number]; Exp 6/17) until we have ruled out a clot in the leg.NO aspirin or Advil ,etc. Tylenol OK. Monitor bowel movements; report blood or black, tarry stool.  Reflux of gastric acid may be asymptomatic as this may occur mainly during sleep.The triggers for reflux  include stress; the "aspirin family" ; alcohol; peppermint; and caffeine (coffee, tea, cola, and chocolate). The aspirin family would include aspirin and the nonsteroidal agents such as ibuprofen &  Naproxen. Tylenol would not cause reflux. If having symptoms ; food & drink should be avoided for @ least 2 hours before going to bed. Take the stomach acid blocker before breakfast and the evening meal.  To prevent palpitations or premature beats, avoid stimulants such as decongestants, diet pills, nicotine, or caffeine (coffee, tea, cola, or chocolate) to excess.

## 2015-01-25 NOTE — Progress Notes (Signed)
   Subjective:    Patient ID: [name] [name], female    DOB: [birth year], 69 y.o.   MRN: [id number]  HPI  She is being seen because of increasing cardiac irregularity. Her son is a physician and sent a message by EMR outlining her course to date.  She's been in [place] for several months but returned February 10th. While there she was on Metroprolol tartrate 25 mg once daily & losartan 25 mg daily. Since returning she's been on Metroprolol succinate 50 mg daily while continuing the losartan.  She's had increasing irregular heartbeats; this occurred several times over the past weekend. Her pulse has been as low as 50 but typically  will be 70-80. Her systolic blood pressure typically is 100-130. Occasionally it has been as high as 150.  Additional history obtained at the office visit reveals swelling of the lower extremities beginning 2 weeks ago. She's had some substernal chest pain at rest. She's also had epigastric discomfort. The last episode of chest discomfort was either yesterday or this morning. She has noted that the heart does race intermittently and is irregular.  Review of Systems She has had some GERD symptoms but has been off her PPI.   She's had increasing stool frequency.  She denies hoarseness, dysphagia, melena, or rectal bleeding.  She also has had discomfort around the orbits which radiates over the crown. She's been taking Tylenol for that.      Objective:   Physical Exam  Positive or pertinent findings include: She has small pterygia bilaterally. Wax is present in otic canals greater on the left than the right. Septum is deviated to the right. There is slight increase in second heart sound. She's tender to palpation in the epigastrium. Dorsalis pedis pulses are decreased. Positive Homans sign is suggested in the right lower extremity.   General appearance :adequately nourished; in no distress. Eyes: No conjunctival inflammation or scleral icterus is  present. Oral exam:  Lips and gums are healthy appearing.There is no oropharyngeal erythema or exudate noted. Dental hygiene is good. Heart:  Normal rate and regular rhythm. S1 normal without gallop, murmur, click, rub or other extra sounds   Lungs:Chest clear to auscultation; no wheezes, rhonchi,rales ,or rubs present.No increased work of breathing.  Abdomen: bowel sounds normal, soft and non-tender without masses, organomegaly or hernias noted.  No guarding or rebound.  Vascular : all pulses equal ; no bruits present. Skin:Warm & dry.  Intact without suspicious lesions or rashes ; no tenting or jaundice  Lymphatic: No lymphadenopathy is noted about the head, neck, axilla Neuro: Strength, tone & DTRs normal.        Assessment & Plan:  #1 bigeminy with unifocal PVCs. Poor R-wave progression across the precordium, most notable in lead V3. No associated ischemic ST-T changes.  #2 chest pain and clinically positive Homans in the right lower extremity in the context of international air travel prior to onset of symptoms.  #3 GERD with possible esophagitis.Asked to take the acid blocker twice a day.  Plan: She'll be covered with Xarelto until deep venous thrombosis or pulmonary thromboemboli can be ruled out. While on this novel anticoagulant she should avoid aspirin or any nonsteroidals.Close stool monitor critical. Once acute process ruled out; Cardiology referral to assess unifocal PVCs and HTN.

## 2015-01-25 NOTE — Progress Notes (Signed)
Pre visit review using our clinic review tool, if applicable. No additional management support is needed unless otherwise documented below in the visit note. 

## 2015-01-26 ENCOUNTER — Other Ambulatory Visit: Payer: Self-pay | Admitting: Internal Medicine

## 2015-01-26 DIAGNOSIS — I493 Ventricular premature depolarization: Secondary | ICD-10-CM

## 2015-01-26 DIAGNOSIS — R0789 Other chest pain: Secondary | ICD-10-CM

## 2015-01-26 DIAGNOSIS — I10 Essential (primary) hypertension: Secondary | ICD-10-CM

## 2015-01-26 LAB — D-DIMER, QUANTITATIVE: D-Dimer, Quant: 0.35 ug/mL-FEU (ref 0.00–0.48)

## 2015-01-29 ENCOUNTER — Other Ambulatory Visit: Payer: Self-pay | Admitting: Internal Medicine

## 2015-01-31 ENCOUNTER — Telehealth: Payer: Self-pay | Admitting: Internal Medicine

## 2015-01-31 NOTE — Telephone Encounter (Signed)
Informed that referral has already been entered and that they would be contacted as soon as an appointment is able to be scheduled.

## 2015-01-31 NOTE — Telephone Encounter (Signed)
Pt son called in and said that  Pt would like referral to a Cardiologist

## 2015-02-09 ENCOUNTER — Ambulatory Visit (INDEPENDENT_AMBULATORY_CARE_PROVIDER_SITE_OTHER): Payer: Medicare Other | Admitting: Cardiovascular Disease

## 2015-02-09 ENCOUNTER — Encounter: Payer: Self-pay | Admitting: Cardiovascular Disease

## 2015-02-09 VITALS — BP 100/60 | HR 78 | Ht 63.0 in | Wt 147.8 lb

## 2015-02-09 DIAGNOSIS — R0789 Other chest pain: Secondary | ICD-10-CM

## 2015-02-09 DIAGNOSIS — I10 Essential (primary) hypertension: Secondary | ICD-10-CM | POA: Diagnosis not present

## 2015-02-09 DIAGNOSIS — Z79899 Other long term (current) drug therapy: Secondary | ICD-10-CM

## 2015-02-09 DIAGNOSIS — E785 Hyperlipidemia, unspecified: Secondary | ICD-10-CM | POA: Diagnosis not present

## 2015-02-09 MED ORDER — PANTOPRAZOLE SODIUM 40 MG PO TBEC: 40.0000 mg | DELAYED_RELEASE_TABLET | Freq: Every day | ORAL | Status: DC

## 2015-02-09 NOTE — Patient Instructions (Signed)
Dr Gwenlyn Found has ordered the following test(s) to be done: 1. Blood work - to be done [place] has recommended making the following medication changes: STOP Omeprazole START Protonix 40 - take 1 tablet by mouth once daily. A prescription has been sent to your pharmacy  Dr Gwenlyn Found wants you to follow-up in 3 months.

## 2015-02-09 NOTE — Assessment & Plan Note (Signed)
History of hyperlipidemia with recent lipid profile performed one year ago revealing a total cholesterol 210, LDL 141 and HDL 46. We will recheck a lipid and liver profile

## 2015-02-09 NOTE — Assessment & Plan Note (Signed)
History of hypertension with blood pressure measured today at 100/60. She is on losartan and Toprol. Continue current meds at current dosing

## 2015-02-09 NOTE — Assessment & Plan Note (Signed)
[name] [name] was referred by Dr. Linna Darner for validation atypical chest pain. She was evaluated 5 years ago by Dr. Sallyanne Kuster and had a Myoview stress test that was normal. Her cardiac risk factor profile is notable for treated hypertension and hyperlipidemia. She does have GERD. She recently came back from being in [place] for 3 months approximately one month ago and has had chest pain since. This sounds a lot like her GERD. I'm going to change her omeprazole to Protonix and I will see her back in 3 months for follow-up. This point, I do not feel compelled to pursue a noninvasive workup

## 2015-02-09 NOTE — Progress Notes (Signed)
02/09/2015 [name] [name]   [birth year]  [id number]  Primary Physician [name] Grant, MD Primary Cardiologist: [name] Harp MD [name] [name]   HPI:  [name] is a 69 year old married Caucasian female mother of 3 children patient Dr. Gwendolyn [name] ho saw Dr. Sallyanne [name] 5 years ago. She was referred for atypical chest pain. Her cardiac risk factor profile is notable for treated hypertension and mild hyperlipidemia. She has never had a heart attack or stroke. She is otherwise healthy except for GERD. She doesn't saw Dr. Sallyanne [name] back in 2011 for atypical chest pain and workup was negative including a Myoview stress test. She was recently out of the country for several months and returned one month ago. Since that time she's had daily chest pain. She was under a lot of stress and she was away the pain itself sounds like GERD, begin subxiphoid and has no other characteristic symptoms of angina. I'm going to change her omeprazole to Protonix and we'll see her back in 3 months.   Current Outpatient Prescriptions  Medication Sig Dispense Refill  . aspirin 81 MG tablet Take 81 mg by mouth 3 (three) times a week. Monday, Wednesday, and Fridays    . ibandronate (BONIVA) 150 MG tablet Take 1 tablet (150 mg total) by mouth every 30 (thirty) days. Take in the morning with a full glass of water, on an empty stomach, and do not take anything else by mouth or lie down for the next 30 min. 3 tablet 3  . losartan (COZAAR) 25 MG tablet Take 1 tablet (25 mg total) by mouth daily. 90 tablet 1  . metoprolol succinate (TOPROL-XL) 50 MG 24 hr tablet Take 25 mg by mouth daily. Take with or immediately following a meal.    . pantoprazole (PROTONIX) 40 MG tablet Take 1 tablet (40 mg total) by mouth daily. 30 tablet 11   No current facility-administered medications for this visit.    No Known Allergies  History   Social History  . Marital Status: Married    Spouse Name: N/A  . Number of  Children: N/A  . Years of Education: N/A   Occupational History  . Not on file.   Social History Main Topics  . Smoking status: Never Smoker   . Smokeless tobacco: Never Used  . Alcohol Use: No  . Drug Use: No  . Sexual Activity: Not on file   Other Topics Concern  . Not on file   Social History Narrative   Married, lives with spouse. Retired Licensed conveyancer, Print production planner. Has 3 kids (moved to [place] to be closer)- youngest son MD. [name] [name] from [place] 06/2010, lived in [place] x 10years     Review of Systems: General: negative for chills, fever, night sweats or weight changes.  Cardiovascular: negative for chest pain, dyspnea on exertion, edema, orthopnea, palpitations, paroxysmal nocturnal dyspnea or shortness of breath Dermatological: negative for rash Respiratory: negative for cough or wheezing Urologic: negative for hematuria Abdominal: negative for nausea, vomiting, diarrhea, bright red blood per rectum, melena, or hematemesis Neurologic: negative for visual changes, syncope, or dizziness All other systems reviewed and are otherwise negative except as noted above.    Blood pressure 100/60, pulse 78, height 5\' 3"  (1.6 m), weight 147 lb 12.8 oz (67.042 kg).  General appearance: alert and no distress Neck: no adenopathy, no carotid bruit, no JVD, supple, symmetrical, trachea midline and thyroid not enlarged, symmetric, no tenderness/mass/nodules Lungs: clear to auscultation bilaterally Heart: regular rate and rhythm,  S1, S2 normal, no murmur, click, rub or gallop Extremities: extremities normal, atraumatic, no cyanosis or edema  EKG normal sinus rhythm at 78 with ventricular trigeminy. I personally reviewed. This EKG  ASSESSMENT AND PLAN:   Atypical chest pain [name] was referred by Dr. Linna [name] for validation atypical chest pain. She was evaluated 5 years ago by Dr. Sallyanne [name] and had a Myoview stress test that was normal. Her cardiac risk factor profile is notable for  treated hypertension and hyperlipidemia. She does have GERD. She recently came back from being in [place] for 3 months approximately one month ago and has had chest pain since. This sounds a lot like her GERD. I'm going to change her omeprazole to Protonix and I will see her back in 3 months for follow-up. This point, I do not feel compelled to pursue a noninvasive workup   Essential hypertension History of hypertension with blood pressure measured today at 100/60. She is on losartan and Toprol. Continue current meds at current dosing   Hyperlipidemia History of hyperlipidemia with recent lipid profile performed one year ago revealing a total cholesterol 210, LDL 141 and HDL 46. We will recheck a lipid and liver profile       [name] Harp MD [facility], [facility] 02/09/2015 2:38 PM

## 2015-02-14 LAB — LIPID PANEL
CHOL/HDL RATIO: 5.2 ratio
Cholesterol: 215 mg/dL — ABNORMAL HIGH (ref 0–200)
HDL: 41 mg/dL — ABNORMAL LOW (ref >=46)
LDL CALC: 140 mg/dL — AB (ref 0–99)
Triglycerides: 168 mg/dL — ABNORMAL HIGH (ref <150)
VLDL: 34 mg/dL (ref 0–40)

## 2015-02-14 LAB — HEPATIC FUNCTION PANEL
ALT: 13 U/L (ref 0–35)
AST: 17 U/L (ref 0–37)
Albumin: 4.2 g/dL (ref 3.5–5.2)
Alkaline Phosphatase: 43 U/L (ref 39–117)
Bilirubin, Direct: 0.1 mg/dL (ref 0.0–0.3)
Indirect Bilirubin: 0.5 mg/dL (ref 0.2–1.2)
Total Bilirubin: 0.6 mg/dL (ref 0.2–1.2)
Total Protein: 6.7 g/dL (ref 6.0–8.3)

## 2015-02-20 ENCOUNTER — Telehealth: Payer: Self-pay | Admitting: *Deleted

## 2015-02-20 DIAGNOSIS — Z79899 Other long term (current) drug therapy: Secondary | ICD-10-CM

## 2015-02-20 DIAGNOSIS — E785 Hyperlipidemia, unspecified: Secondary | ICD-10-CM

## 2015-02-20 MED ORDER — ATORVASTATIN CALCIUM 40 MG PO TABS: 40.0000 mg | ORAL_TABLET | Freq: Every day | ORAL | Status: DC

## 2015-02-20 NOTE — Telephone Encounter (Signed)
-----   Message from Lorretta Harp, MD sent at 02/18/2015  4:55 PM EDT ----- Not at goal with + CRF. Start atorva 40 mg and re check

## 2015-02-20 NOTE — Telephone Encounter (Signed)
Results released to my chart [place] sent to pharmacy and lab slip mailed for recheck

## 2015-03-19 ENCOUNTER — Telehealth: Payer: Self-pay

## 2015-03-19 NOTE — Telephone Encounter (Signed)
Left message with spouse to ask the patient to call regarding AWV.

## 2015-03-20 NOTE — Telephone Encounter (Signed)
Encounter closed

## 2015-04-16 ENCOUNTER — Ambulatory Visit (INDEPENDENT_AMBULATORY_CARE_PROVIDER_SITE_OTHER): Payer: Medicare Other | Admitting: Internal Medicine

## 2015-04-16 ENCOUNTER — Encounter: Payer: Self-pay | Admitting: Internal Medicine

## 2015-04-16 ENCOUNTER — Other Ambulatory Visit (INDEPENDENT_AMBULATORY_CARE_PROVIDER_SITE_OTHER): Payer: Medicare Other

## 2015-04-16 VITALS — BP 140/82 | HR 62 | Resp 18 | Ht 63.0 in | Wt 144.8 lb

## 2015-04-16 DIAGNOSIS — Z0000 Encounter for general adult medical examination without abnormal findings: Secondary | ICD-10-CM | POA: Diagnosis not present

## 2015-04-16 DIAGNOSIS — Z23 Encounter for immunization: Secondary | ICD-10-CM | POA: Diagnosis not present

## 2015-04-16 DIAGNOSIS — M810 Age-related osteoporosis without current pathological fracture: Secondary | ICD-10-CM | POA: Diagnosis not present

## 2015-04-16 DIAGNOSIS — E785 Hyperlipidemia, unspecified: Secondary | ICD-10-CM

## 2015-04-16 DIAGNOSIS — I10 Essential (primary) hypertension: Secondary | ICD-10-CM | POA: Diagnosis not present

## 2015-04-16 DIAGNOSIS — H6122 Impacted cerumen, left ear: Secondary | ICD-10-CM

## 2015-04-16 DIAGNOSIS — Z1239 Encounter for other screening for malignant neoplasm of breast: Secondary | ICD-10-CM

## 2015-04-16 LAB — LIPID PANEL
Cholesterol: 214 mg/dL — ABNORMAL HIGH (ref 0–200)
HDL: 51.7 mg/dL (ref >39.00)
LDL Cholesterol: 140 mg/dL — ABNORMAL HIGH (ref 0–99)
NonHDL: 162.3
TRIGLYCERIDES: 110 mg/dL (ref 0.0–149.0)
Total CHOL/HDL Ratio: 4
VLDL: 22 mg/dL (ref 0.0–40.0)

## 2015-04-16 MED ORDER — ATORVASTATIN CALCIUM 40 MG PO TABS: 20.0000 mg | ORAL_TABLET | Freq: Every day | ORAL | Status: DC

## 2015-04-16 NOTE — Progress Notes (Signed)
Subjective:    Patient ID: [name] [name], female    DOB: [birth year], 69 y.o.   MRN: [id number]  HPI   Here for medicare wellness  Diet: heart healthy or DM if diabetic Physical activity: sedentary Depression/mood screen: negative Hearing: intact to whispered voice Visual acuity: grossly normal, performs annual eye exam  ADLs: capable Fall risk: none Home safety: good Cognitive evaluation: intact to orientation, naming, recall and repetition EOL planning: adv directives, full code/ I agree  I have personally reviewed and have noted 1. The patient's medical and social history 2. Their use of alcohol, tobacco or illicit drugs 3. Their current medications and supplements 4. The patient's functional ability including ADL's, fall risks, home safety risks and hearing or visual impairment. 5. Diet and physical activities 6. Evidence for depression or mood disorders  Also reviewed chronic medical conditions, interval events and current concerns  Past Medical History  Diagnosis Date  . Migraine   . Hypertension   . GERD (gastroesophageal reflux disease) 11/08/2007, 04/12/2012  . IBS (irritable bowel syndrome)     diarrhea predom  . Hiatal hernia 11/08/2007, 04/12/2012  . 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 History of kidney stones   . Gastritis 11/08/2007, 04/12/2012    H pylori bx neg 03/2012  . Shingles outbreak 04/2013  . Osteoporosis 09/27/2010    DEXA 01/21/2012: -2.1 spine and R fem, -1.8 L fem s/p fosamax  2004-2011 DEXA 04/18/14 @ [facility]: -2.5 - rec to start Prolia    . PVC's (premature ventricular contractions)   . Atypical chest pain   . Hyperlipidemia    Family History  Problem Relation Age of Onset  . Hypertension Mother   . Stroke Father 20  . Colon cancer Neg Hx   . Bone cancer Brother 65  . Alzheimer's disease Mother 68   History  Substance Use Topics  . Smoking status: Never Smoker   . Smokeless tobacco: Never Used  .  Alcohol Use: No    Review of Systems  Constitutional: Negative for fatigue and unexpected weight change.  HENT: Positive for hearing loss (L>R side, gradual, painless).   Respiratory: Negative for cough, shortness of breath and wheezing.   Cardiovascular: Negative for chest pain, palpitations and leg swelling.  Gastrointestinal: Negative for nausea, abdominal pain and diarrhea.  Neurological: Negative for dizziness, weakness, light-headedness and headaches.  Psychiatric/Behavioral: Negative for dysphoric mood. The patient is not nervous/anxious.   All other systems reviewed and are negative.      Objective:    Physical Exam  Constitutional: She is oriented to person, place, and time. She appears well-developed and well-nourished. No distress.  Spouse at side  HENT:  Left ear with soft cerumen impaction. After irrigation, bilateral tympanic membranes clear without erythema or effusion  Cardiovascular: Normal rate, regular rhythm and normal heart sounds.   No murmur heard. Pulmonary/Chest: Effort normal and breath sounds normal. No respiratory distress.  Musculoskeletal: She exhibits no edema.  varicose veins changes bilaterally distal legs. b knee - boggy synovitis - min tender to palpation over joint line; FROM and ligamentous function intact  Neurological: She is alert and oriented to person, place, and time. She has normal reflexes. No cranial nerve deficit.    BP 140/82 mmHg  Pulse 62  Resp 18  Ht 5\' 3"  (1.6 m)  Wt 144 lb 12.8 oz (65.681 kg)  BMI 25.66 kg/m2  SpO2 95% Wt Readings from Last 3 Encounters:  04/16/15 144 lb  12.8 oz (65.681 kg)  02/09/15 147 lb 12.8 oz (67.042 kg)  01/25/15 145 lb (65.772 kg)     Lab Results  Component Value Date   WBC 9.1 01/25/2015   HGB 14.8 01/25/2015   HCT 43.5 01/25/2015   PLT 245.0 01/25/2015   GLUCOSE 95 01/25/2015   CHOL 215* 02/13/2015   TRIG 168* 02/13/2015   HDL 41* 02/13/2015   LDLDIRECT 133.0 02/07/2013   LDLCALC  140* 02/13/2015   ALT 13 02/13/2015   AST 17 02/13/2015   NA 141 01/25/2015   K 4.4 01/25/2015   CL 104 01/25/2015   CREATININE 0.86 01/25/2015   BUN 19 01/25/2015   CO2 33* 01/25/2015   TSH 0.77 01/25/2015    Dg Chest 2 View  01/25/2015   CLINICAL DATA:  Three weeks of substernal chest pain, history of hypertension and gastritis; reportedly no history of smoking.  EXAM: CHEST  2 VIEW  COMPARISON:  PA and lateral chest x-ray dated March 15, 2012  FINDINGS: The lungs are hyperinflated with hemidiaphragm flattening. There is no focal infiltrate. There is no pleural effusion or pneumothorax. There is stable scarring in the left lateral costophrenic gutter. The heart is normal in size. The pulmonary vascularity is not engorged. There is mild tortuosity of the descending thoracic aorta. The mediastinum is normal in width. The bony thorax is unremarkable.  IMPRESSION: Hyperinflation which may be voluntary or could reflect reactive airway disease. There is no active cardiopulmonary disease.   Electronically Signed   By: David  [place]   On: 01/25/2015 11:28   Procedure: wax removal Reason: wax impaction Risks and benefits of procedure discussed with the patient who agrees to proceed. Ear(s) irrigated with warm water. Large amount of wax removed. Instrumentation with metal ear loop was performed to accomplish wax removal. the patient tolerated procedure well.     Assessment & Plan:   AWV/z00.00 - Today patient counseled on age appropriate routine health concerns for screening and prevention, each reviewed and up to date or declined. Immunizations reviewed and up to date or declined. Labs ordered and reviewed. Risk factors for depression reviewed and negative. Hearing function and visual acuity are intact. ADLs screened and addressed as needed. Functional ability and level of safety reviewed and appropriate. Education, counseling and referrals performed based on assessed risks today. Patient provided with  a copy of personalized plan for preventive services.  L cerumen impaction - irrigation as above today - to call if continued concerns  Problem List Items Addressed This Visit    Essential hypertension    changed Bbloc to 24h XR in 03/2011 at pt request to improve compliance - Also noted BP control exacerbated by pain episodes -  previously taking clonidine prn SBP>160 - not needed in >75mo Added ARB low dose 09/2013 - much improved The current medical regimen is effective;  continue present plan and medications.   BP Readings from Last 3 Encounters:  04/16/15 140/82  02/09/15 100/60  01/25/15 128/72         Hyperlipidemia    recommended to begin atorva 40 qd 01/2015 by cards due to CRF and atypical CP symptoms (now resolved with tx GERD) Pt stopped statin due ot headache and stomach pains Recheck now and advise to resume 1/2 dose for CRF control      Osteoporosis    Previously on weekly Fosamax 2004 through October 2011 - stopped same because of GI reflux symptoms and esophageal stricture Recheck DEXA 12/2011 and 03/2014 -  Due ot progressive loss, recommended to resume tx - recommended Prolia but too $ so taking monthly Boniva instead  Advised continued calcium plus vitamin D supplements and weightbearing exercise        Other Visit Diagnoses    Routine general medical examination at a health care facility    -  Primary        Gwendolyn Grant, MD

## 2015-04-16 NOTE — Assessment & Plan Note (Signed)
recommended to begin atorva 40 qd 01/2015 by cards due to CRF and atypical CP symptoms (now resolved with tx GERD) Pt stopped statin due ot headache and stomach pains Recheck now and advise to resume 1/2 dose for CRF control

## 2015-04-16 NOTE — Assessment & Plan Note (Signed)
changed Bbloc to 24h XR in 03/2011 at pt request to improve compliance - Also noted BP control exacerbated by pain episodes -  previously taking clonidine prn SBP>160 - not needed in >33mo Added ARB low dose 09/2013 - much improved The current medical regimen is effective;  continue present plan and medications.   BP Readings from Last 3 Encounters:  04/16/15 140/82  02/09/15 100/60  01/25/15 128/72

## 2015-04-16 NOTE — Assessment & Plan Note (Signed)
Previously on weekly Fosamax 2004 through October 2011 - stopped same because of GI reflux symptoms and esophageal stricture Recheck DEXA 12/2011 and 03/2014 -   Due ot progressive loss, recommended to resume tx - recommended Prolia but too $ so taking monthly Boniva instead  Advised continued calcium plus vitamin D supplements and weightbearing exercise

## 2015-04-16 NOTE — Patient Instructions (Addendum)
It was good to see you today.  We have reviewed your prior records including labs and tests today  Health Maintenance reviewed - Prevnar 13 pneumonia vaccine updated today. Referral for mammogram and please check on shingles vaccine coverage with your insurance. All other recommended immunizations and age-appropriate screenings are up-to-date.  Test(s) ordered today. Your results will be released to [facility] (or called to you) after review, usually within 72hours after test completion. If any changes need to be made, you will be notified at that same time.  Medications reviewed and updated We will plan to resume atorvastatin at half dose, 20 mg daily if needed based on cholesterol results No other medication changes recommended at this time.  Please schedule followup in 12 months for annual exam and labs, call sooner if problems.  Your ear has been irrigated of wax today -let us know if continued hearing problems persist for referral to audiologist and hearing testing   Health Maintenance Adopting a healthy lifestyle and getting preventive care can go a long way to promote health and wellness. Talk with your health care provider about what schedule of regular examinations is right for you. This is a good chance for you to check in with your provider about disease prevention and staying healthy. In between checkups, there are plenty of things you can do on your own. Experts have done a lot of research about which lifestyle changes and preventive measures are most likely to keep you healthy. Ask your health care provider for more information. WEIGHT AND DIET  Eat a healthy diet  Be sure to include plenty of vegetables, fruits, low-fat dairy products, and lean protein.  Do not eat a lot of foods high in solid fats, added sugars, or salt.  Get regular exercise. This is one of the most important things you can do for your health.  Most adults should exercise for at least 150 minutes each week.  The exercise should increase your heart rate and make you sweat (moderate-intensity exercise).  Most adults should also do strengthening exercises at least twice a week. This is in addition to the moderate-intensity exercise.  Maintain a healthy weight  Body mass index (BMI) is a measurement that can be used to identify possible weight problems. It estimates body fat based on height and weight. Your health care provider can help determine your BMI and help you achieve or maintain a healthy weight.  For females 65 years of age and older:   A BMI below 18.5 is considered underweight.  A BMI of 18.5 to 24.9 is normal.  A BMI of 25 to 29.9 is considered overweight.  A BMI of 30 and above is considered obese.  Watch levels of cholesterol and blood lipids  You should start having your blood tested for lipids and cholesterol at 70 years of age, then have this test every 5 years.  You may need to have your cholesterol levels checked more often if:  Your lipid or cholesterol levels are high.  You are older than 70 years of age.  You are at high risk for heart disease.  CANCER SCREENING   Lung Cancer  Lung cancer screening is recommended for adults 40-19 years old who are at high risk for lung cancer because of a history of smoking.  A yearly low-dose CT scan of the lungs is recommended for people who:  Currently smoke.  Have quit within the past 15 years.  Have at least a 30-pack-year history of smoking. A pack  year is smoking an average of one pack of cigarettes a day for 1 year.  Yearly screening should continue until it has been 15 years since you quit.  Yearly screening should stop if you develop a health problem that would prevent you from having lung cancer treatment.  Breast Cancer  Practice breast self-awareness. This means understanding how your breasts normally appear and feel.  It also means doing regular breast self-exams. Let your health care provider know  about any changes, no matter how small.  If you are in your 20s or 30s, you should have a clinical breast exam (CBE) by a health care provider every 1-3 years as part of a regular health exam.  If you are 78 or older, have a CBE every year. Also consider having a breast X-ray (mammogram) every year.  If you have a family history of breast cancer, talk to your health care provider about genetic screening.  If you are at high risk for breast cancer, talk to your health care provider about having an MRI and a mammogram every year.  Breast cancer gene (BRCA) assessment is recommended for women who have family members with BRCA-related cancers. BRCA-related cancers include:  Breast.  Ovarian.  Tubal.  Peritoneal cancers.  Results of the assessment will determine the need for genetic counseling and BRCA1 and BRCA2 testing. Cervical Cancer Routine pelvic examinations to screen for cervical cancer are no longer recommended for nonpregnant women who are considered low risk for cancer of the pelvic organs (ovaries, uterus, and vagina) and who do not have symptoms. A pelvic examination may be necessary if you have symptoms including those associated with pelvic infections. Ask your health care provider if a screening pelvic exam is right for you.   The Pap test is the screening test for cervical cancer for women who are considered at risk.  If you had a hysterectomy for a problem that was not cancer or a condition that could lead to cancer, then you no longer need Pap tests.  If you are older than 65 years, and you have had normal Pap tests for the past 10 years, you no longer need to have Pap tests.  If you have had past treatment for cervical cancer or a condition that could lead to cancer, you need Pap tests and screening for cancer for at least 20 years after your treatment.  If you no longer get a Pap test, assess your risk factors if they change (such as having a new sexual partner). This  can affect whether you should start being screened again.  Some women have medical problems that increase their chance of getting cervical cancer. If this is the case for you, your health care provider may recommend more frequent screening and Pap tests.  The human papillomavirus (HPV) test is another test that may be used for cervical cancer screening. The HPV test looks for the virus that can cause cell changes in the cervix. The cells collected during the Pap test can be tested for HPV.  The HPV test can be used to screen women 81 years of age and older. Getting tested for HPV can extend the interval between normal Pap tests from three to five years.  An HPV test also should be used to screen women of any age who have unclear Pap test results.  After 70 years of age, women should have HPV testing as often as Pap tests.  Colorectal Cancer  This type of cancer can be detected and  often prevented.  Routine colorectal cancer screening usually begins at 70 years of age and continues through 70 years of age.  Your health care provider may recommend screening at an earlier age if you have risk factors for colon cancer.  Your health care provider may also recommend using home test kits to check for hidden blood in the stool.  A small camera at the end of a tube can be used to examine your colon directly (sigmoidoscopy or colonoscopy). This is done to check for the earliest forms of colorectal cancer.  Routine screening usually begins at age 39.  Direct examination of the colon should be repeated every 5-10 years through 70 years of age. However, you may need to be screened more often if early forms of precancerous polyps or small growths are found. Skin Cancer  Check your skin from head to toe regularly.  Tell your health care provider about any new moles or changes in moles, especially if there is a change in a mole's shape or color.  Also tell your health care provider if you have a  mole that is larger than the size of a pencil eraser.  Always use sunscreen. Apply sunscreen liberally and repeatedly throughout the day.  Protect yourself by wearing long sleeves, pants, a wide-brimmed hat, and sunglasses whenever you are outside. HEART DISEASE, DIABETES, AND HIGH BLOOD PRESSURE   Have your blood pressure checked at least every 1-2 years. High blood pressure causes heart disease and increases the risk of stroke.  If you are between 75 years and 16 years old, ask your health care provider if you should take aspirin to prevent strokes.  Have regular diabetes screenings. This involves taking a blood sample to check your fasting blood sugar level.  If you are at a normal weight and have a low risk for diabetes, have this test once every three years after 70 years of age.  If you are overweight and have a high risk for diabetes, consider being tested at a younger age or more often. PREVENTING INFECTION  Hepatitis B  If you have a higher risk for hepatitis B, you should be screened for this virus. You are considered at high risk for hepatitis B if:  You were born in a country where hepatitis B is common. Ask your health care provider which countries are considered high risk.  Your parents were born in a high-risk country, and you have not been immunized against hepatitis B (hepatitis B vaccine).  You have HIV or AIDS.  You use needles to inject street drugs.  You live with someone who has hepatitis B.  You have had sex with someone who has hepatitis B.  You get hemodialysis treatment.  You take certain medicines for conditions, including cancer, organ transplantation, and autoimmune conditions. Hepatitis C  Blood testing is recommended for:  Everyone born from [birth year] through 1966.  Anyone with known risk factors for hepatitis C. Sexually transmitted infections (STIs)  You should be screened for sexually transmitted infections (STIs) including gonorrhea and  chlamydia if:  You are sexually active and are younger than 70 years of age.  You are older than 70 years of age and your health care provider tells you that you are at risk for this type of infection.  Your sexual activity has changed since you were last screened and you are at an increased risk for chlamydia or gonorrhea. Ask your health care provider if you are at risk.  If you do not have  HIV, but are at risk, it may be recommended that you take a prescription medicine daily to prevent HIV infection. This is called pre-exposure prophylaxis (PrEP). You are considered at risk if:  You are sexually active and do not regularly use condoms or know the HIV status of your partner(s).  You take drugs by injection.  You are sexually active with a partner who has HIV. Talk with your health care provider about whether you are at high risk of being infected with HIV. If you choose to begin PrEP, you should first be tested for HIV. You should then be tested every 3 months for as long as you are taking PrEP.  PREGNANCY   If you are premenopausal and you may become pregnant, ask your health care provider about preconception counseling.  If you may become pregnant, take 400 to 800 micrograms (mcg) of folic acid every day.  If you want to prevent pregnancy, talk to your health care provider about birth control (contraception). OSTEOPOROSIS AND MENOPAUSE   Osteoporosis is a disease in which the bones lose minerals and strength with aging. This can result in serious bone fractures. Your risk for osteoporosis can be identified using a bone density scan.  If you are 34 years of age or older, or if you are at risk for osteoporosis and fractures, ask your health care provider if you should be screened.  Ask your health care provider whether you should take a calcium or vitamin D supplement to lower your risk for osteoporosis.  Menopause may have certain physical symptoms and risks.  Hormone replacement  therapy may reduce some of these symptoms and risks. Talk to your health care provider about whether hormone replacement therapy is right for you.  HOME CARE INSTRUCTIONS   Schedule regular health, dental, and eye exams.  Stay current with your immunizations.   Do not use any tobacco products including cigarettes, chewing tobacco, or electronic cigarettes.  If you are pregnant, do not drink alcohol.  If you are breastfeeding, limit how much and how often you drink alcohol.  Limit alcohol intake to no more than 1 drink per day for nonpregnant women. One drink equals 12 ounces of beer, 5 ounces of wine, or 1 ounces of hard liquor.  Do not use street drugs.  Do not share needles.  Ask your health care provider for help if you need support or information about quitting drugs.  Tell your health care provider if you often feel depressed.  Tell your health care provider if you have ever been abused or do not feel safe at home. Document Released: 05/26/2011 Document Revised: 03/27/2014 Document Reviewed: 10/12/2013 [facility] Patient Information 2015 [place], [place]. This information is not intended to replace advice given to you by your health care provider. Make sure you discuss any questions you have with your health care provider.

## 2015-04-22 ENCOUNTER — Other Ambulatory Visit: Payer: Self-pay | Admitting: Internal Medicine

## 2015-05-15 ENCOUNTER — Ambulatory Visit: Payer: Medicare Other | Admitting: Cardiovascular Disease

## 2015-05-23 ENCOUNTER — Telehealth: Payer: Self-pay | Admitting: Family Medicine

## 2015-05-23 NOTE — Telephone Encounter (Signed)
Spoke to pt's son & offered her an appt this Friday at 12pm. Unfortunately, that time will not work. I told him I will watch Dr. Thompson Caul schedule& if something comes available I will call them back.

## 2015-05-23 NOTE — Telephone Encounter (Signed)
Is requesting patient to be worked in sooner.  Patient has appointment on July 13th

## 2015-05-29 NOTE — Telephone Encounter (Signed)
Spoke to pt, scheduled appt for 7.6.16 @ 10:15a.

## 2015-05-30 ENCOUNTER — Encounter: Payer: Self-pay | Admitting: Family Medicine

## 2015-05-30 ENCOUNTER — Ambulatory Visit (INDEPENDENT_AMBULATORY_CARE_PROVIDER_SITE_OTHER): Payer: Medicare Other | Admitting: Family Medicine

## 2015-05-30 VITALS — BP 130/70 | HR 64 | Ht 63.0 in | Wt 140.0 lb

## 2015-05-30 DIAGNOSIS — M1711 Unilateral primary osteoarthritis, right knee: Secondary | ICD-10-CM

## 2015-05-30 DIAGNOSIS — M1712 Unilateral primary osteoarthritis, left knee: Secondary | ICD-10-CM | POA: Diagnosis not present

## 2015-05-30 DIAGNOSIS — M1710 Unilateral primary osteoarthritis, unspecified knee: Secondary | ICD-10-CM

## 2015-05-30 NOTE — Progress Notes (Signed)
CC: Knee pain follow up  HPI: Patient is a very pleasant 69 year old female coming in with bilateral knee pain. 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  Patient has elected toavoid any surgeries and has been doing very well with conservative therapy. Patient states though that The pain in both her knees has increased slightly. Patient ascribes it as a dull aching sensation. Patient is to be traveling and doing a lot of walking and wants to be very active. Patient states that there is no locking or giving out on her. Patient though states that going up and downstairs has been very challenging. Rates the severity of pain as 5 out of 10.   Patient does have a past medical history significant for a left-sided meniscectomy as well as Baker cyst removal.  Past medical, surgical, family and social history reviewed. Medications reviewed all in the electronic medical record.   Review of Systems: No headache, visual changes, nausea, vomiting, diarrhea, constipation, dizziness, abdominal pain, skin rash, fevers, chills, night sweats, weight loss, swollen lymph nodes, body aches, joint swelling, muscle aches, chest pain, shortness of breath, mood changes.   Objective:    Blood pressure 130/70, pulse 64, height 5\' 3"  (1.6 m), weight 140 lb (63.504 kg), SpO2 95 %.   General: No apparent distress alert and oriented x3 mood and affect normal, dressed appropriately.  HEENT: Pupils equal, extraocular movements intact Respiratory: Patient's speak in full sentences and does not appear short of breath Cardiovascular: No lower extremity edema, non tender, no erythema Skin: Warm dry intact with no signs of infection or rash on extremities or on axial skeleton. Abdomen: Soft nontender Neuro: Cranial nerves II through XII are intact, neurovascularly intact in all  extremities with 2+ DTRs and 2+ pulses. Lymph: No lymphadenopathy of posterior or anterior cervical chain or axillae bilaterally.  Gait normal with good balance and coordination.  MSK: Non tender with full range of motion and good stability and symmetric strength and tone of shoulders, elbows, wrist, hip and ankles bilaterally.  Knee: Bilateral Normal to inspection with no erythema or effusion or obvious bony abnormalities. Patient is tender to palpation over the medial joint lines  ROM full in flexion and extension and lower leg rotation. Patient though does have pain with full extension in the posterior aspect of the leg. Ligaments with solid consistent endpoints including ACL, PCL, LCL, MCL.Marland Kitchen Positive painful patellar compression. Patellar glide with moderate crepitus. Patellar and quadriceps tendons unremarkable. Hamstring and quadriceps strength is normal. Patient does have significant tightness of the hamstrings bilaterally   Procedure note After informed written and verbal consent, patient was seated on exam table. Right knee was prepped with alcohol swab and utilizing anterolateral approach, patient's right knee space was injected with 4:1  marcaine 0.5%: Kenalog 40mg /dL. Patient tolerated the procedure well without immediate complications.  Procedure note After informed written and verbal consent, patient was seated on exam table. Left knee was prepped with alcohol swab and utilizing anterolateral approach, patient's left knee space was injected with 4:1  marcaine 0.5%: Kenalog 40mg /dL. Patient tolerated the procedure well without immediate complications.  Impression and Recommendations:     This case required medical decision making of moderate complexity.

## 2015-05-30 NOTE — Assessment & Plan Note (Signed)
Patient was given bilateral injections today. Patient tolerated the procedures very well. Patient's given the choice of possibly custom bracing as well as possibility of hyaluronic acid in the future. Patient will be going out of town and will follow-up when she comes back in 3-4 weeks for further evaluation and treatment.  Spent  25 minutes with patient face-to-face and had greater than 50% of counseling including as described above in assessment and plan.

## 2015-05-30 NOTE — Patient Instructions (Addendum)
Enjoy espana! Ice is your friend pennsaid pinkie amount topically 2 times daily as needed.  Stay active and consider a custom brace or the orthovisc Call me if you need anything when you get back.

## 2015-05-30 NOTE — Progress Notes (Signed)
Pre visit review using our clinic review tool, if applicable. No additional management support is needed unless otherwise documented below in the visit note. 

## 2015-06-04 ENCOUNTER — Other Ambulatory Visit: Payer: Self-pay | Admitting: Internal Medicine

## 2015-06-06 ENCOUNTER — Ambulatory Visit: Payer: Medicare Other | Admitting: Family Medicine

## 2015-06-19 ENCOUNTER — Encounter: Payer: Self-pay | Admitting: Cardiovascular Disease

## 2015-08-02 ENCOUNTER — Other Ambulatory Visit: Payer: Self-pay

## 2015-08-02 MED ORDER — LOSARTAN POTASSIUM 25 MG PO TABS: 25.0000 mg | ORAL_TABLET | Freq: Every day | ORAL | Status: DC

## 2015-10-10 ENCOUNTER — Other Ambulatory Visit: Payer: Self-pay

## 2015-10-10 MED ORDER — ATORVASTATIN CALCIUM 40 MG PO TABS: 20.0000 mg | ORAL_TABLET | Freq: Every day | ORAL | Status: DC

## 2015-10-10 MED ORDER — PANTOPRAZOLE SODIUM 40 MG PO TBEC: 40.0000 mg | DELAYED_RELEASE_TABLET | Freq: Every day | ORAL | Status: DC

## 2015-10-11 ENCOUNTER — Other Ambulatory Visit: Payer: Self-pay

## 2015-10-11 MED ORDER — PANTOPRAZOLE SODIUM 40 MG PO TBEC: 40.0000 mg | DELAYED_RELEASE_TABLET | Freq: Every day | ORAL | Status: DC

## 2015-10-11 NOTE — Telephone Encounter (Signed)
Pt needs to schedule f/u appt with Dr. Gwenlyn Found. Rx sent to pharmacy

## 2015-10-23 ENCOUNTER — Ambulatory Visit: Payer: Medicare Other | Admitting: Physician Assistant

## 2015-10-30 ENCOUNTER — Encounter: Payer: Self-pay | Admitting: Internal Medicine

## 2015-10-30 ENCOUNTER — Ambulatory Visit (INDEPENDENT_AMBULATORY_CARE_PROVIDER_SITE_OTHER): Payer: Medicare Other | Admitting: Internal Medicine

## 2015-10-30 VITALS — BP 148/88 | HR 44 | Temp 98.1°F | Resp 16 | Wt 146.0 lb

## 2015-10-30 DIAGNOSIS — Z23 Encounter for immunization: Secondary | ICD-10-CM

## 2015-10-30 DIAGNOSIS — J069 Acute upper respiratory infection, unspecified: Secondary | ICD-10-CM | POA: Diagnosis not present

## 2015-10-30 MED ORDER — AMOXICILLIN-POT CLAVULANATE 875-125 MG PO TABS: 1.0000 | ORAL_TABLET | Freq: Two times a day (BID) | ORAL | Status: DC

## 2015-10-30 NOTE — Patient Instructions (Signed)
  Antibiotic was sent to your pharmacy.  Continue the over the counter cold medications.   Your prescription(s) have been submitted to your pharmacy. Please take as directed and contact our office if you believe you are having problem(s) with the medication(s).  Call with any questions.

## 2015-10-30 NOTE — Progress Notes (Signed)
Subjective:    Patient ID: [name] [name], female    DOB: [birth year], 69 y.o.   MRN: [id number]  HPI She is here for an acute visit for cold symptoms. She states her symptoms started approximately 3 weeks ago.  Some of her symptoms have improved, but he needs them have not improved, which is why she is here today. She has been taking over-the-counter cold medications including cough syrup, Alka-Seltzer with temporary improvement in her symptoms.  She states significant fatigue has not improved, nasal congestion, sinus pressure, rhinorrhea, dry cough, wheezing, chest pain only with cough, nausea, muscle aches and headaches. She denies any fevers, change in appetite, ear pain and shortness of breath. She did have a sore throat, but that has resolved. She denies any dizziness or lightheadedness.   Medications and allergies reviewed with patient and updated if appropriate.  Patient Active Problem List   Diagnosis Date Noted  . Atypical chest pain 02/09/2015  . Hyperlipidemia 02/09/2015  . Primary localized osteoarthrosis, lower leg 02/22/2014  . Osteoarthritis of right knee 10/03/2013  . Lateral meniscal tear 10/03/2013  . 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 Varicose vein of leg   . Knee pain, bilateral 05/03/2013  . Allergic rhinitis   . IBS (irritable bowel syndrome) 04/07/2011  . OTHER DYSPHAGIA 11/01/2010  . MIGRAINE HEADACHE 09/27/2010  . Essential hypertension 09/27/2010  . GERD 09/27/2010  . OSTEOARTHRITIS 09/27/2010  . Osteoporosis 09/27/2010  . HEADACHE 09/27/2010  . RENAL CALCULUS, HX OF 09/27/2010    Current Outpatient Prescriptions on File Prior to Visit  Medication Sig Dispense Refill  . aspirin 81 MG tablet Take 81 mg by mouth 3 (three) times a week. Monday, Wednesday, and Fridays    . atorvastatin (LIPITOR) 40 MG tablet Take 0.5 tablets (20 mg total) by mouth daily. 45 tablet 1  . butalbital-acetaminophen-caffeine (FIORICET, ESGIC) 50-325-40 MG per tablet  Take 1 tablet by mouth every 4 (four) hours as needed for headache. 40 tablet 5  . ibandronate (BONIVA) 150 MG tablet Take 1 tablet (150 mg total) by mouth every 30 (thirty) days. 3 tablet 3  . losartan (COZAAR) 25 MG tablet Take 1 tablet (25 mg total) by mouth daily. 90 tablet 3  . metoprolol succinate (TOPROL-XL) 50 MG 24 hr tablet Take 25 mg by mouth daily. Take with or immediately following a meal.    . pantoprazole (PROTONIX) 40 MG tablet Take 1 tablet (40 mg total) by mouth daily. 90 tablet 0   No current facility-administered medications on file prior to visit.    Past Medical History  Diagnosis Date  . Migraine   . Hypertension   . GERD (gastroesophageal reflux disease) 11/08/2007, 04/12/2012  . IBS (irritable bowel syndrome)     diarrhea predom  . Hiatal hernia 11/08/2007, 04/12/2012  . 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 History of kidney stones   . Gastritis 11/08/2007, 04/12/2012    H pylori bx neg 03/2012  . Shingles outbreak 04/2013  . Osteoporosis 09/27/2010    DEXA 01/21/2012: -2.1 spine and R fem, -1.8 L fem s/p fosamax  2004-2011 DEXA 04/18/14 @ [facility]: -2.5 - rec to start Prolia    . PVC's (premature ventricular contractions)   . Atypical chest pain   . Hyperlipidemia     Past Surgical History  Procedure Laterality Date  . Appendectomy  1958  . Tonsillectomy  1960  . Maxilofacial  1992  . Cystocele repair  2008  Social History   Social History  . Marital Status: Married    Spouse Name: N/A  . Number of Children: N/A  . Years of Education: N/A   Social History Main Topics  . Smoking status: Never Smoker   . Smokeless tobacco: Never Used  . Alcohol Use: No  . Drug Use: No  . Sexual Activity: Not on file   Other Topics Concern  . Not on file   Social History Narrative   Married, lives with spouse. Retired Licensed conveyancer, Print production planner. Has 3 kids (moved to [place] to be closer)- youngest son MD. Dorie Rank to [place] from [place]  06/2010, lived in [place] x 10years    Review of Systems  Constitutional: Positive for fatigue. Negative for fever, chills and appetite change.  HENT: Positive for congestion, rhinorrhea and sinus pressure. Negative for ear pain and sore throat.   Respiratory: Positive for cough (dry) and wheezing. Negative for shortness of breath.   Cardiovascular: Positive for chest pain (with cough).  Gastrointestinal: Positive for nausea.  Musculoskeletal: Positive for myalgias.  Neurological: Positive for headaches. Negative for dizziness and light-headedness.       Objective:   Filed Vitals:   10/30/15 1056 10/30/15 1140  BP: 164/94 148/88  Pulse: 44   Temp: 98.1 F (36.7 C)   Resp: 16    Filed Weights   10/30/15 1056  Weight: 146 lb (66.225 kg)   Body mass index is 25.87 kg/(m^2).   Physical Exam GENERAL APPEARANCE: Appears stated age, well appearing, NAD EYES: conjunctiva clear, no icterus HEENT: bilateral tympanic membranes and ear canals normal, oropharynx with mild erythema, no thyromegaly, trachea midline, no cervical or supraclavicular lymphadenopathy LUNGS: Clear to auscultation without wheeze or crackles, unlabored breathing, good air entry bilaterally HEART: Normal S1,S2 without murmurs EXTREMITIES: Without clubbing, cyanosis, or edema        Assessment & Plan:   Upper respiratory infection, possible sinus infection Given duration and her current symptoms we will go ahead and start antibiotic Augmentin twice a day 10 days Continue increased rest and fluids Continue over-the-counter cold medications  Blood pressure is elevated here today Repeat blood pressure was slightly lower, but still elevated She does monitor at home occasionally advised her to monitor regularly to make sure it is well controlled Is not controlled advised to call or return No change in medication today

## 2015-10-30 NOTE — Progress Notes (Signed)
Pre visit review using our clinic review tool, if applicable. No additional management support is needed unless otherwise documented below in the visit note. 

## 2015-11-01 ENCOUNTER — Other Ambulatory Visit: Payer: Self-pay | Admitting: Internal Medicine

## 2015-11-15 ENCOUNTER — Telehealth: Payer: Self-pay | Admitting: Internal Medicine

## 2015-11-15 ENCOUNTER — Ambulatory Visit (INDEPENDENT_AMBULATORY_CARE_PROVIDER_SITE_OTHER): Payer: Medicare Other

## 2015-11-15 VITALS — BP 178/90 | HR 98 | Ht 63.0 in | Wt 142.0 lb

## 2015-11-15 DIAGNOSIS — I10 Essential (primary) hypertension: Secondary | ICD-10-CM | POA: Diagnosis not present

## 2015-11-15 DIAGNOSIS — R072 Precordial pain: Secondary | ICD-10-CM

## 2015-11-15 DIAGNOSIS — I493 Ventricular premature depolarization: Secondary | ICD-10-CM

## 2015-11-15 NOTE — Telephone Encounter (Signed)
Spoke to pt and she is feeling better since she called this morning.   Pt does not want to schedule an appt at this time.

## 2015-11-15 NOTE — Telephone Encounter (Signed)
[name]  DOB: 14-May-1946    Initial Comment Caller states mother wants to be seen, having stomach ache, loose stools, high bp    Nurse Assessment  Nurse: Raphael Gibney, RN, Vanita Ingles Date/Time (Eastern Time): 11/15/2015 11:14:21 AM  Confirm and document reason for call. If symptomatic, describe symptoms. ---Caller states spouse is nauseated. Stools are loose. Has had 7 loose stools. Has had headache. Had abd pain earlier. Went to the cardologist this am, she had EKG which showed irregular heart beat who said to see primary doctor as he thinks abnormal irregular beat is due to the loose stools. No pain right now. BP 178/90 at the cardiologist office. She has urinated.  Has the patient traveled out of the country within the last 30 days? ---No  Does the patient have any new or worsening symptoms? ---Yes  Will a triage be completed? ---Yes  Related visit to physician within the last 2 weeks? ---No  Does the PT have any chronic conditions? (i.e. diabetes, asthma, etc.) ---Yes  List chronic conditions. ---cardiac arrhythmia  Is this a behavioral health or substance abuse call? ---No     Guidelines    Guideline Title Affirmed Question Affirmed Notes  Diarrhea [1] SEVERE diarrhea (e.g., 7 or more times / day more than normal) AND [2] age > 60 years    Final Disposition User   See Physician within 4 Hours (or PCP triage) Raphael Gibney, RN, Vera    Comments  No appts available at [facility] does not want to go to urgent care. Would like call back from office regarding appt tomorrow. She is going to take something for the headache.   Referrals  GO TO FACILITY REFUSED   Disagree/Comply: Disagree  Disagree/Comply Reason: Wait and see

## 2015-11-15 NOTE — Patient Instructions (Signed)
Schedule appointment with Primary M.D. today

## 2015-11-15 NOTE — Telephone Encounter (Signed)
Please advise if an OV, ER or OTC diarrhea meds (or any other advise) would be beneficial.

## 2015-11-15 NOTE — Progress Notes (Signed)
1.) Reason for visit: Walk In  2.) Name of MD requesting visit: Dr.Berry  3.) H&P: Patient complaining of last night had pain in left chest pain radiating around left side down left arm.No chest pain at present.Last night had 6 loose stools.This morning has had 3 loose stools.Stated she has a headache,don't feel good  4.) ROS related to problem: EKG done showed to Dr.Berry revealed sinus rhythm with PVC's,rate 98.  5.) Assessment and plan per MD: Advised to see PCP today

## 2015-11-23 ENCOUNTER — Telehealth: Payer: Self-pay | Admitting: Internal Medicine

## 2015-11-23 NOTE — Telephone Encounter (Signed)
Pt has made appt to see Dr. Quay Burow on 11/28/15...Johny Chess

## 2015-11-23 NOTE — Telephone Encounter (Signed)
[name]  DOB: 1946/07/21    Initial Comment Caller states she has pain left arm for 3 - 4 weeks.   Nurse Assessment  Nurse: Verlin Fester RN, Stanton Kidney Date/Time Eilene Ghazi Time): 11/23/2015 8:26:23 AM  Confirm and document reason for call. If symptomatic, describe symptoms. ---Patient states her left arm has been hurting for 3-4 weeks  Has the patient traveled out of the country within the last 30 days? ---No  Does the patient have any new or worsening symptoms? ---Yes  Will a triage be completed? ---Yes  Related visit to physician within the last 2 weeks? ---No  Does the PT have any chronic conditions? (i.e. diabetes, asthma, etc.) ---No  Is this a behavioral health or substance abuse call? ---No     Guidelines    Guideline Title Affirmed Question Affirmed Notes  Arm Pain Numbness (i.e., loss of sensation) in hand or fingers    Final Disposition User   See Physician within 24 Hours Noe, RN, [facility]    Referrals  REFERRED TO PCP OFFICE   Disagree/Comply: Leta Baptist

## 2015-11-28 ENCOUNTER — Ambulatory Visit (INDEPENDENT_AMBULATORY_CARE_PROVIDER_SITE_OTHER): Payer: PPO | Admitting: Internal Medicine

## 2015-11-28 ENCOUNTER — Encounter: Payer: Self-pay | Admitting: Internal Medicine

## 2015-11-28 ENCOUNTER — Telehealth: Payer: Self-pay | Admitting: Internal Medicine

## 2015-11-28 VITALS — BP 132/84 | HR 79 | Temp 98.2°F | Resp 16 | Wt 146.0 lb

## 2015-11-28 DIAGNOSIS — M25512 Pain in left shoulder: Secondary | ICD-10-CM | POA: Diagnosis not present

## 2015-11-28 DIAGNOSIS — B079 Viral wart, unspecified: Secondary | ICD-10-CM

## 2015-11-28 MED ORDER — CLONIDINE HCL 0.1 MG PO TABS: ORAL_TABLET | ORAL | Status: DC

## 2015-11-28 NOTE — Telephone Encounter (Signed)
Is requesting Dr. Quay Burow to enter referral for dermatologist.

## 2015-11-28 NOTE — Patient Instructions (Signed)
Try taking tylenol and icing the shoulder   Schedule an appointment with Dr. Tamala Julian for further treatment of your shoulder   See dermatology for your wart.

## 2015-11-28 NOTE — Progress Notes (Signed)
Subjective:    Patient ID: [name] [name], female    DOB: August 29, 1946, 70 y.o.   MRN: [id number]  HPI She is here for an acute visit for ;left arm pain.   The pain is in the left arm.  It started one month ago after she was here. she did have her flu shot in the left arm, bu the pain did not start until a couple of days later.  She denies new activities or injuries. She has decrased ROM of her left shoulder with lateral abduction and reaching to her back.  She can not take lay on her left side because it hurts.  She has taken tylenol a couple of times and it has helped temporarily.  No radiating pain.  No new numbness/tingling in left arm. It has gotten slightly better.     She leaves 2/10 for 3-4 months.  She has a mold or wart on her left arm that has gotten bigger and sometimes bleeds.  She would like to have it removed.     Medications and allergies reviewed with patient and updated if appropriate.  Patient Active Problem List   Diagnosis Date Noted  . Atypical chest pain 02/09/2015  . Hyperlipidemia 02/09/2015  . Primary localized osteoarthrosis, lower leg 02/22/2014  . Osteoarthritis of right knee 10/03/2013  . Lateral meniscal tear 10/03/2013  . 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 Varicose vein of leg   . Knee pain, bilateral 05/03/2013  . Allergic rhinitis   . IBS (irritable bowel syndrome) 04/07/2011  . OTHER DYSPHAGIA 11/01/2010  . MIGRAINE HEADACHE 09/27/2010  . Essential hypertension 09/27/2010  . GERD 09/27/2010  . OSTEOARTHRITIS 09/27/2010  . Osteoporosis 09/27/2010  . HEADACHE 09/27/2010  . RENAL CALCULUS, HX OF 09/27/2010    Current Outpatient Prescriptions on File Prior to Visit  Medication Sig Dispense Refill  . aspirin 81 MG tablet Take 81 mg by mouth 3 (three) times a week. Monday, Wednesday, and Fridays    . atorvastatin (LIPITOR) 40 MG tablet Take 0.5 tablets (20 mg total) by mouth daily. 45 tablet 1  . butalbital-acetaminophen-caffeine  (FIORICET, ESGIC) 50-325-40 MG per tablet Take 1 tablet by mouth every 4 (four) hours as needed for headache. 40 tablet 5  . ibandronate (BONIVA) 150 MG tablet Take 1 tablet (150 mg total) by mouth every 30 (thirty) days. 3 tablet 3  . losartan (COZAAR) 25 MG tablet Take 1 tablet (25 mg total) by mouth daily. 90 tablet 3  . metoprolol succinate (TOPROL-XL) 50 MG 24 hr tablet Take 25 mg by mouth daily. Take with or immediately following a meal.    . pantoprazole (PROTONIX) 40 MG tablet Take 1 tablet (40 mg total) by mouth daily. 90 tablet 0   No current facility-administered medications on file prior to visit.    Past Medical History  Diagnosis Date  . Migraine   . Hypertension   . GERD (gastroesophageal reflux disease) 11/08/2007, 04/12/2012  . IBS (irritable bowel syndrome)     diarrhea predom  . Hiatal hernia 11/08/2007, 04/12/2012  . 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 History of kidney stones   . Gastritis 11/08/2007, 04/12/2012    H pylori bx neg 03/2012  . Shingles outbreak 04/2013  . Osteoporosis 09/27/2010    DEXA 01/21/2012: -2.1 spine and R fem, -1.8 L fem s/p fosamax  2004-2011 DEXA 04/18/14 @ [facility]: -2.5 - rec to start Prolia    . PVC's (premature ventricular contractions)   .  Atypical chest pain   . Hyperlipidemia     Past Surgical History  Procedure Laterality Date  . Appendectomy  1958  . Tonsillectomy  1960  . Maxilofacial  1992  . Cystocele repair  2008    Social History   Social History  . Marital Status: Married    Spouse Name: N/A  . Number of Children: N/A  . Years of Education: N/A   Social History Main Topics  . Smoking status: Never Smoker   . Smokeless tobacco: Never Used  . Alcohol Use: No  . Drug Use: No  . Sexual Activity: Not Asked   Other Topics Concern  . None   Social History Narrative   Married, lives with spouse. Retired Licensed conveyancer, Print production planner. Has 3 kids (moved to [place] to be closer)- youngest son  MD. Dorie Rank to [place] from [place] 06/2010, lived in [place] x 10years    Family History  Problem Relation Age of Onset  . Hypertension Mother   . Alzheimer's disease Mother 23  . AAA (abdominal aortic aneurysm) Mother   . Stroke Father 75  . Colon cancer Neg Hx   . Bone cancer Brother 19    Review of Systems See HPI    Objective:   Filed Vitals:   11/28/15 1324  BP: 132/84  Pulse: 79  Temp: 98.2 F (36.8 C)  Resp: 16   Filed Weights   11/28/15 1324  Weight: 146 lb (66.225 kg)   Body mass index is 25.87 kg/(m^2).   Physical Exam  Constitutional: She appears well-developed and well-nourished.  Musculoskeletal:  Tenderness with palpation lateral aspect of left shoulder, decreased ROM with lateral abduction and reaching to lower back, no effusion or deformity  Neurological:  Normal strength left hand.  Normal sensation left arm.  Skin:  Wart on left posterior arm          Assessment & Plan:   Left shoulder pain with decreased ROM Possible rotator cuff injury, small tear Tylenol Ice Will refer to Dr. Arrie Senate Will see derm for removal

## 2015-11-28 NOTE — Progress Notes (Signed)
Pre visit review using our clinic review tool, if applicable. No additional management support is needed unless otherwise documented below in the visit note. 

## 2015-11-29 NOTE — Telephone Encounter (Signed)
ordered

## 2015-11-29 NOTE — Telephone Encounter (Signed)
Please advise. I assume this is in regards to the wart on her L arm,

## 2015-11-30 NOTE — Telephone Encounter (Signed)
LVM informing pt of referral.

## 2015-12-10 ENCOUNTER — Ambulatory Visit: Payer: PPO | Admitting: Family Medicine

## 2015-12-10 ENCOUNTER — Other Ambulatory Visit: Payer: Self-pay | Admitting: Cardiovascular Disease

## 2015-12-20 ENCOUNTER — Other Ambulatory Visit (INDEPENDENT_AMBULATORY_CARE_PROVIDER_SITE_OTHER): Payer: PPO

## 2015-12-20 ENCOUNTER — Encounter: Payer: Self-pay | Admitting: Family Medicine

## 2015-12-20 ENCOUNTER — Ambulatory Visit (INDEPENDENT_AMBULATORY_CARE_PROVIDER_SITE_OTHER): Payer: PPO | Admitting: Family Medicine

## 2015-12-20 VITALS — BP 140/78 | HR 76 | Wt 145.0 lb

## 2015-12-20 DIAGNOSIS — M25512 Pain in left shoulder: Secondary | ICD-10-CM

## 2015-12-20 DIAGNOSIS — M79622 Pain in left upper arm: Secondary | ICD-10-CM | POA: Insufficient documentation

## 2015-12-20 MED ORDER — GABAPENTIN 100 MG PO CAPS: 100.0000 mg | ORAL_CAPSULE | Freq: Every day | ORAL | Status: DC

## 2015-12-20 NOTE — Progress Notes (Signed)
[name] [address] [place], [place] [address] Phone: [phone] Subjective:      CC: Left arm pain  [id number] [name] [name] is a 70 y.o. female coming in with complaint of left arm pain. Patient states that this started a couple months ago. Patient exhibited could be some association with patient's flu shot. Started at that time. The receiving got better. Seem to be very localized. States that now it only seems to give her difficulty when she tries to reach her arm in an abductor did state for she tries to reach behind her back. Stops her from a pain but not from truly going the entire way. Patient has not tried any home modalities. Denies any radiation down the arm or any numbness or tingling. Denies any associated neck pain. Patient has gone to be traveling out of the country for 3 months and like to know what she can do for it.    Past Medical History  Diagnosis Date  . Migraine   . Hypertension   . GERD (gastroesophageal reflux disease) 11/08/2007, 04/12/2012  . IBS (irritable bowel syndrome)     diarrhea predom  . Hiatal hernia 11/08/2007, 04/12/2012  . 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 History of kidney stones   . Gastritis 11/08/2007, 04/12/2012    H pylori bx neg 03/2012  . Shingles outbreak 04/2013  . Osteoporosis 09/27/2010    DEXA 01/21/2012: -2.1 spine and R fem, -1.8 L fem s/p fosamax  2004-2011 DEXA 04/18/14 @ [facility]: -2.5 - rec to start Prolia    . PVC's (premature ventricular contractions)   . Atypical chest pain   . Hyperlipidemia    Past Surgical History  Procedure Laterality Date  . Appendectomy  1958  . Tonsillectomy  1960  . Maxilofacial  1992  . Cystocele repair  2008   Social History   Social History  . Marital Status: Married    Spouse Name: N/A  . Number of Children: N/A  . Years of Education: N/A   Social History Main Topics  . Smoking status: Never Smoker   . Smokeless  tobacco: Never Used  . Alcohol Use: No  . Drug Use: No  . Sexual Activity: Not Asked   Other Topics Concern  . None   Social History Narrative   Married, lives with spouse. Retired Licensed conveyancer, Print production planner. Has 3 kids (moved to [place] to be closer)- youngest son MD. Dorie Rank to [place] from [place] 06/2010, lived in [place] x 10years   No Known Allergies Family History  Problem Relation Age of Onset  . Hypertension Mother   . Alzheimer's disease Mother 8  . AAA (abdominal aortic aneurysm) Mother   . Stroke Father 54  . Colon cancer Neg Hx   . Bone cancer Brother 6    Past medical history, social, surgical and family history all reviewed in electronic medical record.  No pertanent information unless stated regarding to the chief complaint.   Review of Systems: No headache, visual changes, nausea, vomiting, diarrhea, constipation, dizziness, abdominal pain, skin rash, fevers, chills, night sweats, weight loss, swollen lymph nodes, body aches, joint swelling, muscle aches, chest pain, shortness of breath, mood changes.   Objective Blood pressure 140/78, pulse 76, weight 145 lb (65.772 kg).  General: No apparent distress alert and oriented x3 mood and affect normal, dressed appropriately.  HEENT: Pupils equal, extraocular movements intact  Respiratory: Patient's speak in full sentences and does not  appear short of breath  Cardiovascular: No lower extremity edema, non tender, no erythema  Skin: Warm dry intact with no signs of infection or rash on extremities or on axial skeleton.  Abdomen: Soft nontender  Neuro: Cranial nerves II through XII are intact, neurovascularly intact in all extremities with 2+ DTRs and 2+ pulses.  Lymph: No lymphadenopathy of posterior or anterior cervical chain or axillae bilaterally.  Gait normal with good balance and coordination.  MSK:  Non tender with full range of motion and good stability and symmetric strength and tone of , elbows, wrist, hip, knee and ankles  bilaterally.  Shoulder: Left Inspection reveals no abnormalities, atrophy or asymmetry. Patient is tender to palpation mostly over the mid humerus ROM is full in all planes passively Rotator cuff strength normal throughout. Minimal signs of impingement Speeds and Yergason's tests normal. No labral pathology noted with negative Obrien's, negative clunk and good stability. Normal scapular function observed. No painful arc and no drop arm sign. No apprehension sign Contralateral shoulder unremarkable   Limited muscular skeletal ultrasound was performed and interpreted by Hulan Saas, M  Limited ultrasound the patient's arm did not show any significant abnormality of patient's shoulder today. Patient does have an area that seems to have severe inflammation of the nerve the superior lateral aspect of the humerus. Possible small myositis inclusion noted. Seems well circumcised. No significant erythema around the area and to suggest infectious etiology.  Images were stored in internal hard to drive Impression: Nerve irritation of the proximal mid humerus as well as possibly inclusion body and surrounding myositis    Impression and Recommendations:     This case required medical decision making of moderate complexity.    '

## 2015-12-20 NOTE — Patient Instructions (Signed)
So good to see you  Travel safe and have fun  For the arm I would do gabapentin 100mg  at night pennsaid pinkie amount topically 2 times daily as needed.  Compression sleeve can help the arm as well.  If not better or all the way gone when you come back come see me again .

## 2015-12-20 NOTE — Assessment & Plan Note (Signed)
Patient does not have any signs of a rotator cuff injury today. Could be early frozen shoulder syndrome but patient is very pinpoint tenderness over the humerus. No systemic findings. No masses palpated. Inclusion body myositis is deathly within the differential. Patient also has possible nerve irritation. No significant swelling noted. Because patient is leaving town for 3 months patient given a perception for gabapentin. Patient will take this at night. I think will be safe with her other medications. In addition of this patient given topical anti-inflammatory to see if this will be beneficial. We discussed with patient I do not think that this is a fracture or anything that would make me to any further imaging. If patient still has pain when she comes back I would like imaging, and likely we would consider a diagnostic as well as therapeutic injection of the shoulder.

## 2016-04-03 ENCOUNTER — Ambulatory Visit (INDEPENDENT_AMBULATORY_CARE_PROVIDER_SITE_OTHER)
Admission: RE | Admit: 2016-04-03 | Discharge: 2016-04-03 | Disposition: A | Discharge status: Home / Self Care | Payer: PPO | Source: Ambulatory Visit | Attending: Family Medicine | Admitting: Family Medicine

## 2016-04-03 ENCOUNTER — Other Ambulatory Visit: Payer: Self-pay

## 2016-04-03 ENCOUNTER — Other Ambulatory Visit: Payer: Self-pay | Admitting: *Deleted

## 2016-04-03 ENCOUNTER — Encounter: Payer: Self-pay | Admitting: Family Medicine

## 2016-04-03 ENCOUNTER — Ambulatory Visit (INDEPENDENT_AMBULATORY_CARE_PROVIDER_SITE_OTHER): Payer: PPO | Admitting: Family Medicine

## 2016-04-03 VITALS — BP 116/76 | HR 62 | Wt 143.0 lb

## 2016-04-03 DIAGNOSIS — S8011XA Contusion of right lower leg, initial encounter: Secondary | ICD-10-CM | POA: Diagnosis not present

## 2016-04-03 DIAGNOSIS — M25561 Pain in right knee: Secondary | ICD-10-CM

## 2016-04-03 DIAGNOSIS — M25571 Pain in right ankle and joints of right foot: Secondary | ICD-10-CM

## 2016-04-03 DIAGNOSIS — M7989 Other specified soft tissue disorders: Secondary | ICD-10-CM | POA: Diagnosis not present

## 2016-04-03 DIAGNOSIS — M79661 Pain in right lower leg: Secondary | ICD-10-CM | POA: Diagnosis not present

## 2016-04-03 MED ORDER — VITAMIN D (ERGOCALCIFEROL) 1.25 MG (50000 UNIT) PO CAPS: 50000.0000 [IU] | ORAL_CAPSULE | ORAL | Status: DC

## 2016-04-03 NOTE — Assessment & Plan Note (Signed)
Patient does have a contusion of the right tibia area. Patient though is tender over the fibular head. X-rays are pending. Do not find any weakness of the lower extremity. Patient does not have any pain over the medial malleolus where do not think messionve fracture is possible.  Contusion most likely. Arnica lotion, able to walk and declined boot.  RTC in 1-2 weeks.

## 2016-04-03 NOTE — Progress Notes (Signed)
[name] [address] [place], [place] [address] Phone: [phone] Subjective:      CC: Right leg pain after fall  [id number] [name] [name] is a 70 y.o. female coming in with complaint of right leg pain. Patient's was walking in her kitchen and unfortunately tripped falling directly on the anterior shin. Had swelling and bruising immediately. Had significant pain. Was unable to bear weight for proximal main 12 hours. Seems to be getting better at this time after icing and taking an anti-inflammatory. Patient is able to walk if necessary. SSEP pain in the area. Pain on the medial as well as lateral aspect just distal to the knee itself. No crepitus. No weakness of the ankle. No numbness. Rates the severity of 8 out of 10 initially but now 5 out of 10.    Past Medical History  Diagnosis Date  . Migraine   . Hypertension   . GERD (gastroesophageal reflux disease) 11/08/2007, 04/12/2012  . IBS (irritable bowel syndrome)     diarrhea predom  . Hiatal hernia 11/08/2007, 04/12/2012  . 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 History of kidney stones   . Gastritis 11/08/2007, 04/12/2012    H pylori bx neg 03/2012  . Shingles outbreak 04/2013  . Osteoporosis 09/27/2010    DEXA 01/21/2012: -2.1 spine and R fem, -1.8 L fem s/p fosamax  2004-2011 DEXA 04/18/14 @ [facility]: -2.5 - rec to start Prolia    . PVC's (premature ventricular contractions)   . Atypical chest pain   . Hyperlipidemia    Past Surgical History  Procedure Laterality Date  . Appendectomy  1958  . Tonsillectomy  1960  . Maxilofacial  1992  . Cystocele repair  2008   Social History   Social History  . Marital Status: Married    Spouse Name: N/A  . Number of Children: N/A  . Years of Education: N/A   Social History Main Topics  . Smoking status: Never Smoker   . Smokeless tobacco: Never Used  . Alcohol Use: No  . Drug Use: No  . Sexual Activity: Not Asked    Other Topics Concern  . None   Social History Narrative   Married, lives with spouse. Retired Licensed conveyancer, Print production planner. Has 3 kids (moved to [place] to be closer)- youngest son MD. Dorie Rank to [place] from [place] 06/2010, lived in [place] x 10years   No Known Allergies Family History  Problem Relation Age of Onset  . Hypertension Mother   . Alzheimer's disease Mother 34  . AAA (abdominal aortic aneurysm) Mother   . Stroke Father 18  . Colon cancer Neg Hx   . Bone cancer Brother 67    Past medical history, social, surgical and family history all reviewed in electronic medical record.  No pertanent information unless stated regarding to the chief complaint.   Review of Systems: No headache, visual changes, nausea, vomiting, diarrhea, constipation, dizziness, abdominal pain, skin rash, fevers, chills, night sweats, weight loss, swollen lymph nodes, body aches, joint swelling, muscle aches, chest pain, shortness of breath, mood changes.   Objective Blood pressure 116/76, pulse 62, weight 143 lb (64.864 kg).  General: No apparent distress alert and oriented x3 mood and affect normal, dressed appropriately.  HEENT: Pupils equal, extraocular movements intact  Respiratory: Patient's speak in full sentences and does not appear short of breath  Cardiovascular: No lower extremity edema, non tender, no erythema  Skin: Warm dry intact with no  signs of infection or rash on extremities or on axial skeleton.  Abdomen: Soft nontender  Neuro: Cranial nerves II through XII are intact, neurovascularly intact in all extremities with 2+ DTRs and 2+ pulses.  Lymph: No lymphadenopathy of posterior or anterior cervical chain or axillae bilaterally.  Gait normal with good balance and coordination.  MSK:  Non tender with full range of motion and good stability and symmetric strength and tone of , elbows, wrist, hip, and ankles bilaterally.  Right leg shows the patient does have a large contusion on the tibial area  anteriorly. This is have some swelling compared to contralateral side. Tender to palpation in this area. Patient is also tender over the proximal fibula. No tenderness around the ankle and has full strength as well as vascular intact. Patient has full movement of the knee itself. No pain over the medial or lateral joint line mild pain over the patellofemoral joint.    Limited muscular skeletal ultrasound was performed and interpreted by Hulan Saas, M  Limited ultrasound the patient's right leg shows the patient is a mild contusion over the tibia bone proximally. Patient's proximal fibula does have an area that has a very small scar several cortical defect. Mild hypoechoic changes and increasing Doppler flow in the area.     Impression and Recommendations:     This case required medical decision making of moderate complexity.    '

## 2016-04-03 NOTE — Patient Instructions (Signed)
Good to see you  Once weekly vitamin D for next 4 weeks then back down to 2000 IU daily  Ice 20 minutes 4 times daily.  Arnica lotion 2 times daily  I would continue to wear compression  See me again in 7-14 days to make sure it resolves

## 2016-04-03 NOTE — Addendum Note (Signed)
Addended by: Douglass Rivers T on: 04/03/2016 09:51 AM   Modules accepted: Orders

## 2016-05-06 ENCOUNTER — Telehealth: Payer: Self-pay

## 2016-05-06 NOTE — Telephone Encounter (Signed)
Patient will be establishing care with you on June 03, 2016---former prolia patient, stopped injections due to $---started on Boniva----when patient sees you in July, can you discuss prolia injections again and i can reverify insurance coverage to see if $ is better now---or if $ is still too high, patient may qualify for assistance program to help with cost----let me know if you want prolia injections resumed---thanks

## 2016-05-08 NOTE — Telephone Encounter (Signed)
Patient has talked with me on phone and is requesting insurance be verified for prolia to see if she can restart prolia injections---routing to dr burns, are you ok with restarting prolia?  thanks

## 2016-06-03 ENCOUNTER — Telehealth: Payer: Self-pay

## 2016-06-03 ENCOUNTER — Ambulatory Visit (INDEPENDENT_AMBULATORY_CARE_PROVIDER_SITE_OTHER): Payer: PPO | Admitting: Internal Medicine

## 2016-06-03 ENCOUNTER — Encounter: Payer: Self-pay | Admitting: Internal Medicine

## 2016-06-03 VITALS — BP 116/84 | HR 67 | Temp 98.2°F | Resp 16 | Ht 63.0 in | Wt 141.0 lb

## 2016-06-03 DIAGNOSIS — K219 Gastro-esophageal reflux disease without esophagitis: Secondary | ICD-10-CM | POA: Diagnosis not present

## 2016-06-03 DIAGNOSIS — R200 Anesthesia of skin: Secondary | ICD-10-CM

## 2016-06-03 DIAGNOSIS — K589 Irritable bowel syndrome without diarrhea: Secondary | ICD-10-CM

## 2016-06-03 DIAGNOSIS — M2550 Pain in unspecified joint: Secondary | ICD-10-CM

## 2016-06-03 DIAGNOSIS — M810 Age-related osteoporosis without current pathological fracture: Secondary | ICD-10-CM | POA: Diagnosis not present

## 2016-06-03 DIAGNOSIS — R208 Other disturbances of skin sensation: Secondary | ICD-10-CM

## 2016-06-03 DIAGNOSIS — E785 Hyperlipidemia, unspecified: Secondary | ICD-10-CM

## 2016-06-03 DIAGNOSIS — I10 Essential (primary) hypertension: Secondary | ICD-10-CM

## 2016-06-03 DIAGNOSIS — G43809 Other migraine, not intractable, without status migrainosus: Secondary | ICD-10-CM | POA: Diagnosis not present

## 2016-06-03 HISTORY — DX: Pain in unspecified joint: M25.50

## 2016-06-03 MED ORDER — BUTALBITAL-APAP-CAFFEINE 50-325-40 MG PO TABS
1.0000 | ORAL_TABLET | ORAL | Status: DC | PRN
Start: 2016-06-03 — End: 2018-10-18

## 2016-06-03 MED ORDER — CALCIUM 600 MG PO TABS: 600.0000 mg | ORAL_TABLET | Freq: Every day | ORAL | Status: DC

## 2016-06-03 MED ORDER — VITAMIN D 50 MCG (2000 UT) PO TABS: 2000.0000 [IU] | ORAL_TABLET | Freq: Every day | ORAL | Status: DC

## 2016-06-03 NOTE — Assessment & Plan Note (Signed)
Fairly diffuse joint pain arthritis no joint swelling except for knees - related to osteoarthritis Unlikely autoimmune in nature Continue symptomatic treatment

## 2016-06-03 NOTE — Progress Notes (Signed)
Pre visit review using our clinic review tool, if applicable. No additional management support is needed unless otherwise documented below in the visit note. 

## 2016-06-03 NOTE — Progress Notes (Signed)
Subjective:    Patient ID: [name] [name], female    DOB: 10/13/1946, 70 y.o.   MRN: [id number]  HPI She is here to establish with a new pcp.  She is here for follow up.  Hypertension: She is taking her medication daily. She is compliant with a low sodium diet.  She denies chest pain, palpitations, regular leg edema, shortness of breath and regular headaches. She is exercising regularly.  She does monitor her blood pressure at home - typically well controlled.    Osteoporosis:  She has ran out of the bonvia and was on it for one year, so has not taken it in approximately one year.   She takes 2000 units of vitamin d daily and calcium 600 mg once daily.  She is doing some walking for exercise.  She was on prolia in the past and would consider going back on it-she did like this better than the Boniva.   GERD:  She is taking her medication daily as prescribed.  She denies any GERD symptoms and feels her GERD is well controlled.   Finger numbness/tinglig: she gets tingling in her fingers at night.  It does not occur during the day.  She denies hand weakness and neck pain.  She denies numbness or tingling in the toes.   Joint pain:  She has b/l elbow and right shoulder pain.  The pain occurs at night, especially at night when she is cold.  Applying heat helps.  She denies swelling in those joints.  She denies hand pain.  She also has ankle pain.  She has b/l knee pain and follows with orthopedics.  She uses braces on her knees.  She uses a cane at times.  She takes tylenol as needed.  She has had injections.     Medications and allergies reviewed with patient and updated if appropriate.  Patient Active Problem List   Diagnosis Date Noted  . Contusion of right leg 04/03/2016  . Left upper arm pain 12/20/2015  . Atypical chest pain 02/09/2015  . Hyperlipidemia 02/09/2015  . Primary localized osteoarthrosis, lower leg 02/22/2014  . Osteoarthritis of right knee 10/03/2013  . Lateral  meniscal tear 10/03/2013  . 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 Varicose vein of leg   . Knee pain, bilateral 05/03/2013  . Allergic rhinitis   . IBS (irritable bowel syndrome) 04/07/2011  . OTHER DYSPHAGIA 11/01/2010  . MIGRAINE HEADACHE 09/27/2010  . Essential hypertension 09/27/2010  . GERD 09/27/2010  . OSTEOARTHRITIS 09/27/2010  . Osteoporosis 09/27/2010  . HEADACHE 09/27/2010  . RENAL CALCULUS, HX OF 09/27/2010    Current Outpatient Prescriptions on File Prior to Visit  Medication Sig Dispense Refill  . aspirin 81 MG tablet Take 81 mg by mouth 3 (three) times a week. Monday, Wednesday, and Fridays    . atorvastatin (LIPITOR) 40 MG tablet Take 0.5 tablets (20 mg total) by mouth daily. 45 tablet 1  . butalbital-acetaminophen-caffeine (FIORICET, ESGIC) 50-325-40 MG per tablet Take 1 tablet by mouth every 4 (four) hours as needed for headache. 40 tablet 5  . cloNIDine (CATAPRES) 0.1 MG tablet 0.1 mg tab by mouth as needed for blood pressure over 160/90; may repeat in 1 hour if needed 30 tablet 3  . losartan (COZAAR) 25 MG tablet Take 1 tablet (25 mg total) by mouth daily. 90 tablet 3  . metoprolol succinate (TOPROL-XL) 50 MG 24 hr tablet Take 25 mg by mouth daily. Take with or immediately following a meal.    .  pantoprazole (PROTONIX) 40 MG tablet Take 1 tablet (40 mg total) by mouth daily. 90 tablet 0  . Vitamin D, Ergocalciferol, (DRISDOL) 50000 units CAPS capsule Take 1 capsule (50,000 Units total) by mouth every 7 (seven) days. 8 capsule 0   No current facility-administered medications on file prior to visit.    Past Medical History  Diagnosis Date  . Migraine   . Hypertension   . GERD (gastroesophageal reflux disease) 11/08/2007, 04/12/2012  . IBS (irritable bowel syndrome)     diarrhea predom  . Hiatal hernia 11/08/2007, 04/12/2012  . 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 History of kidney stones   . Gastritis 11/08/2007,  04/12/2012    H pylori bx neg 03/2012  . Shingles outbreak 04/2013  . Osteoporosis 09/27/2010    DEXA 01/21/2012: -2.1 spine and R fem, -1.8 L fem s/p fosamax  2004-2011 DEXA 04/18/14 @ [facility]: -2.5 - rec to start Prolia    . PVC's (premature ventricular contractions)   . Atypical chest pain   . Hyperlipidemia     Past Surgical History  Procedure Laterality Date  . Appendectomy  1958  . Tonsillectomy  1960  . Maxilofacial  1992  . Cystocele repair  2008    Social History   Social History  . Marital Status: Married    Spouse Name: N/A  . Number of Children: N/A  . Years of Education: N/A   Social History Main Topics  . Smoking status: Never Smoker   . Smokeless tobacco: Never Used  . Alcohol Use: No  . Drug Use: No  . Sexual Activity: Not on file   Other Topics Concern  . Not on file   Social History Narrative   Married, lives with spouse. Retired Licensed conveyancer, Print production planner. Has 3 kids (moved to [place] to be closer)- youngest son MD. Dorie Rank to [place] from [place] 06/2010, lived in [place] x 10years    Family History  Problem Relation Age of Onset  . Hypertension Mother   . Alzheimer's disease Mother 34  . AAA (abdominal aortic aneurysm) Mother   . Stroke Father 64  . Colon cancer Neg Hx   . Bone cancer Brother 19    Review of Systems  Constitutional: Negative for fever and fatigue.  Respiratory: Negative for cough, shortness of breath and wheezing.   Cardiovascular: Positive for leg swelling (at night sometimes). Negative for chest pain and palpitations.  Gastrointestinal: Positive for abdominal pain (occasional in epigastric region).  Musculoskeletal: Positive for arthralgias.  Neurological: Positive for headaches (occasional). Negative for dizziness and light-headedness.       Objective:   Filed Vitals:   06/03/16 1404  BP: 116/84  Pulse: 67  Temp: 98.2 F (36.8 C)  Resp: 16   Filed Weights   06/03/16 1404  Weight: 141 lb (63.957 kg)   Body mass index is 24.98  kg/(m^2).   Physical Exam  Constitutional: She appears well-developed and well-nourished. No distress.  HENT:  Head: Normocephalic and atraumatic.  Eyes: Conjunctivae are normal.  Neck: Neck supple. No tracheal deviation present. No thyromegaly present.  Cardiovascular: Normal rate and regular rhythm.   No murmur heard. Pulmonary/Chest: Effort normal and breath sounds normal. No respiratory distress. She has no wheezes. She has no rales.  Abdominal: Soft. She exhibits no distension. There is no tenderness. There is no rebound and no guarding.  Musculoskeletal: She exhibits no edema.  Lymphadenopathy:    She has no cervical adenopathy.  Skin: Skin  is warm. She is not diaphoretic.  Psychiatric: She has a normal mood and affect.          Assessment & Plan:   See Problem List for Assessment and Plan of chronic medical problems.  Follow-up in 6 months

## 2016-06-03 NOTE — Assessment & Plan Note (Signed)
Infrequent symptoms Takes hyoscyamine rarely

## 2016-06-03 NOTE — Telephone Encounter (Signed)
PA initiated via CoverMyMeds key [id number]

## 2016-06-03 NOTE — Patient Instructions (Addendum)

## 2016-06-03 NOTE — Assessment & Plan Note (Signed)
Only occurs at night Likely related to bilateral carpal tunnel syndrome or cervical disc disease/arthritis Symptoms mild and tolerable Discussed possible tests to determine etiology, but deferred today

## 2016-06-03 NOTE — Assessment & Plan Note (Signed)
Rarely gets migraines Rarely takes fioricet

## 2016-06-03 NOTE — Assessment & Plan Note (Signed)
GERD controlled Continue daily medication  

## 2016-06-03 NOTE — Assessment & Plan Note (Addendum)
Due for a dexa - ordered Taking calcium and vitamin D daily Walking for exercise Had dexa in [place] and had OP in forearm Will consider prolia if covered

## 2016-06-03 NOTE — Assessment & Plan Note (Signed)
BP well controlled Current regimen effective and well tolerated Continue current medications at current doses cmp  

## 2016-06-05 NOTE — Telephone Encounter (Signed)
APPROVED through 11/23/2016 

## 2016-06-13 ENCOUNTER — Telehealth: Payer: Self-pay

## 2016-06-13 NOTE — Telephone Encounter (Signed)
Talked with patient and advised that insurance has been verified with estimated $245 copay for prolia injection---patient will back and schedule nurse visit if she decides to have injection---routing to dr burns, fyi.Marland KitchenMarland Kitchen

## 2016-06-17 ENCOUNTER — Other Ambulatory Visit (INDEPENDENT_AMBULATORY_CARE_PROVIDER_SITE_OTHER): Payer: PPO

## 2016-06-17 ENCOUNTER — Encounter: Payer: Self-pay | Admitting: Internal Medicine

## 2016-06-17 DIAGNOSIS — K219 Gastro-esophageal reflux disease without esophagitis: Secondary | ICD-10-CM | POA: Diagnosis not present

## 2016-06-17 DIAGNOSIS — M810 Age-related osteoporosis without current pathological fracture: Secondary | ICD-10-CM | POA: Diagnosis not present

## 2016-06-17 DIAGNOSIS — H524 Presbyopia: Secondary | ICD-10-CM | POA: Diagnosis not present

## 2016-06-17 DIAGNOSIS — G43809 Other migraine, not intractable, without status migrainosus: Secondary | ICD-10-CM

## 2016-06-17 DIAGNOSIS — E785 Hyperlipidemia, unspecified: Secondary | ICD-10-CM

## 2016-06-17 DIAGNOSIS — H11043 Peripheral pterygium, stationary, bilateral: Secondary | ICD-10-CM | POA: Diagnosis not present

## 2016-06-17 DIAGNOSIS — H04123 Dry eye syndrome of bilateral lacrimal glands: Secondary | ICD-10-CM | POA: Diagnosis not present

## 2016-06-17 DIAGNOSIS — H25813 Combined forms of age-related cataract, bilateral: Secondary | ICD-10-CM | POA: Diagnosis not present

## 2016-06-17 LAB — LIPID PANEL
CHOLESTEROL: 140 mg/dL (ref 0–200)
HDL: 46.5 mg/dL (ref >39.00)
LDL Cholesterol: 67 mg/dL (ref 0–99)
NONHDL: 93.17
Total CHOL/HDL Ratio: 3
Triglycerides: 132 mg/dL (ref 0.0–149.0)
VLDL: 26.4 mg/dL (ref 0.0–40.0)

## 2016-06-17 LAB — TSH: TSH: 1.09 u[IU]/mL (ref 0.35–4.50)

## 2016-06-17 LAB — COMPREHENSIVE METABOLIC PANEL
ALBUMIN: 4 g/dL (ref 3.5–5.2)
ALK PHOS: 46 U/L (ref 39–117)
ALT: 15 U/L (ref 0–35)
AST: 18 U/L (ref 0–37)
BILIRUBIN TOTAL: 0.7 mg/dL (ref 0.2–1.2)
BUN: 16 mg/dL (ref 6–23)
CO2: 30 mEq/L (ref 19–32)
CREATININE: 0.86 mg/dL (ref 0.40–1.20)
Calcium: 9.6 mg/dL (ref 8.4–10.5)
Chloride: 105 mEq/L (ref 96–112)
GFR: 69.13 mL/min (ref >60.00)
GLUCOSE: 95 mg/dL (ref 70–99)
Potassium: 4.4 mEq/L (ref 3.5–5.1)
SODIUM: 140 meq/L (ref 135–145)
TOTAL PROTEIN: 6.7 g/dL (ref 6.0–8.3)

## 2016-06-17 LAB — CBC WITH DIFFERENTIAL/PLATELET
Basophils Absolute: 0 K/uL (ref 0.0–0.1)
Basophils Relative: 0.4 % (ref 0.0–3.0)
Eosinophils Absolute: 0.2 K/uL (ref 0.0–0.7)
Eosinophils Relative: 2.9 % (ref 0.0–5.0)
HCT: 43.1 % (ref 36.0–46.0)
Hemoglobin: 14.6 g/dL (ref 12.0–15.0)
Lymphocytes Relative: 27.9 % (ref 12.0–46.0)
Lymphs Abs: 2 K/uL (ref 0.7–4.0)
MCHC: 33.8 g/dL (ref 30.0–36.0)
MCV: 96.4 fl (ref 78.0–100.0)
Monocytes Absolute: 0.6 K/uL (ref 0.1–1.0)
Monocytes Relative: 8.9 % (ref 3.0–12.0)
Neutro Abs: 4.3 K/uL (ref 1.4–7.7)
Neutrophils Relative %: 59.9 % (ref 43.0–77.0)
Platelets: 204 K/uL (ref 150.0–400.0)
RBC: 4.47 Mil/uL (ref 3.87–5.11)
RDW: 14.3 % (ref 11.5–15.5)
WBC: 7.1 K/uL (ref 4.0–10.5)

## 2016-07-17 ENCOUNTER — Other Ambulatory Visit: Payer: PPO

## 2016-07-17 ENCOUNTER — Ambulatory Visit (INDEPENDENT_AMBULATORY_CARE_PROVIDER_SITE_OTHER)
Admission: RE | Admit: 2016-07-17 | Discharge: 2016-07-17 | Disposition: A | Discharge status: Home / Self Care | Payer: PPO | Source: Ambulatory Visit | Attending: Internal Medicine | Admitting: Internal Medicine

## 2016-07-17 DIAGNOSIS — M810 Age-related osteoporosis without current pathological fracture: Secondary | ICD-10-CM

## 2016-07-20 ENCOUNTER — Encounter: Payer: Self-pay | Admitting: Internal Medicine

## 2016-07-20 DIAGNOSIS — M8580 Other specified disorders of bone density and structure, unspecified site: Secondary | ICD-10-CM | POA: Insufficient documentation

## 2016-07-20 HISTORY — DX: Other specified disorders of bone density and structure, unspecified site: M85.80

## 2016-07-30 DIAGNOSIS — M25562 Pain in left knee: Secondary | ICD-10-CM | POA: Diagnosis not present

## 2016-07-30 DIAGNOSIS — M25561 Pain in right knee: Secondary | ICD-10-CM | POA: Diagnosis not present

## 2016-09-08 ENCOUNTER — Other Ambulatory Visit: Payer: Self-pay | Admitting: Internal Medicine

## 2016-09-15 ENCOUNTER — Encounter: Payer: Self-pay | Admitting: Internal Medicine

## 2016-09-15 ENCOUNTER — Ambulatory Visit (INDEPENDENT_AMBULATORY_CARE_PROVIDER_SITE_OTHER): Payer: PPO | Admitting: Internal Medicine

## 2016-09-15 VITALS — BP 144/84 | HR 78 | Temp 98.0°F | Resp 16 | Ht 63.0 in | Wt 145.0 lb

## 2016-09-15 DIAGNOSIS — G8929 Other chronic pain: Secondary | ICD-10-CM

## 2016-09-15 DIAGNOSIS — M545 Low back pain, unspecified: Secondary | ICD-10-CM | POA: Insufficient documentation

## 2016-09-15 DIAGNOSIS — H9191 Unspecified hearing loss, right ear: Secondary | ICD-10-CM | POA: Insufficient documentation

## 2016-09-15 HISTORY — DX: Low back pain, unspecified: M54.50

## 2016-09-15 HISTORY — DX: Other chronic pain: G89.29

## 2016-09-15 NOTE — Progress Notes (Signed)
Pre visit review using our clinic review tool, if applicable. No additional management support is needed unless otherwise documented below in the visit note. 

## 2016-09-15 NOTE — Assessment & Plan Note (Signed)
Due to impacted cerumen Ear canal cleared of most cerumen Hearing improved Discussed home ear wax removal techniques

## 2016-09-15 NOTE — Assessment & Plan Note (Signed)
Discussed briefly Likely arthritis in nature Can consider further imaging, PT, referral No radiculopathy She will continue symptomatic treatment for now

## 2016-09-15 NOTE — Progress Notes (Signed)
Subjective:    Patient ID: [name] [name], female    DOB: Apr 09, 1946, 70 y.o.   MRN: [id number]  HPI She is here for an acute visit.   Right ear pain:  She has intermittent loss of hearing in her right ear for two weeks.  The hearing will come and go especially if she pushes up under her ear.  She denies any pain in the ear.  She denies any cold symptoms now.  She has had issues with ear wax.  She does use an otc ear wax removal drop, but does not feel it works well.    She had a mild cough and runny nose recently and took alka setlzer and it resolved.  She denies current symptoms, except headaches. She has had headaches daily for a few days.  She is taking tylenol daily.   She still has the right sided neck pain that radiates down to the right upper arm. She has intermittent numbness tingling in some of her fingers which is not new   She has lower back pain.  The pain does not radiate and she has not numbness/tingling.  The pain is intermittent and depends on what activity she is doing.   Medications and allergies reviewed with patient and updated if appropriate.  Patient Active Problem List   Diagnosis Date Noted  . Osteopenia 07/20/2016  . Bilateral finger numbness 06/03/2016  . Joint pain 06/03/2016  . Contusion of right leg 04/03/2016  . Left upper arm pain 12/20/2015  . Atypical chest pain 02/09/2015  . Hyperlipidemia 02/09/2015  . Primary localized osteoarthrosis, lower leg 02/22/2014  . Osteoarthritis of right knee 10/03/2013  . Lateral meniscal tear 10/03/2013  . 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 Varicose vein of leg   . Knee pain, bilateral 05/03/2013  . Allergic rhinitis   . IBS (irritable bowel syndrome) 04/07/2011  . OTHER DYSPHAGIA 11/01/2010  . Migraine headache 09/27/2010  . Essential hypertension 09/27/2010  . GERD 09/27/2010  . OSTEOARTHRITIS 09/27/2010  . HEADACHE 09/27/2010  . RENAL CALCULUS, HX OF 09/27/2010    Current Outpatient  Prescriptions on File Prior to Visit  Medication Sig Dispense Refill  . aspirin 81 MG tablet Take 81 mg by mouth 3 (three) times a week. Monday, Wednesday, and Fridays    . atorvastatin (LIPITOR) 40 MG tablet Take 0.5 tablets (20 mg total) by mouth daily. 45 tablet 1  . butalbital-acetaminophen-caffeine (FIORICET, ESGIC) 50-325-40 MG tablet Take 1 tablet by mouth every 4 (four) hours as needed for headache. 30 tablet 0  . calcium carbonate (OS-CAL) 600 MG tablet Take 1 tablet (600 mg total) by mouth daily. 60 tablet   . Cholecalciferol (VITAMIN D) 2000 units tablet Take 1 tablet (2,000 Units total) by mouth daily.    . cloNIDine (CATAPRES) 0.1 MG tablet 0.1 mg tab by mouth as needed for blood pressure over 160/90; may repeat in 1 hour if needed 30 tablet 3  . losartan (COZAAR) 25 MG tablet TAKE ONE TABLET BY MOUTH ONCE DAILY 90 tablet 2  . metoprolol succinate (TOPROL-XL) 50 MG 24 hr tablet Take 25 mg by mouth daily. Take with or immediately following a meal.    . pantoprazole (PROTONIX) 40 MG tablet Take 1 tablet (40 mg total) by mouth daily. 90 tablet 0   No current facility-administered medications on file prior to visit.     Past Medical History:  Diagnosis Date  . Atypical chest pain   . Gastritis 11/08/2007, 04/12/2012  H pylori bx neg 03/2012  . GERD (gastroesophageal reflux disease) 11/08/2007, 04/12/2012  . Hiatal hernia 11/08/2007, 04/12/2012  . History of kidney stones   . Hyperlipidemia   . Hypertension   . IBS (irritable bowel syndrome)    diarrhea predom  . Migraine   . Osteoporosis 09/27/2010   DEXA 01/21/2012: -2.1 spine and R fem, -1.8 L fem s/p fosamax  2004-2011 DEXA 04/18/14 @ [facility]: -2.5 - rec to start Prolia    . PVC's (premature ventricular contractions)   . 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 Shingles outbreak 04/2013    Past Surgical History:  Procedure Laterality Date  . APPENDECTOMY  1958  . CYSTOCELE REPAIR  2008  .  Maxilofacial  1992  . TONSILLECTOMY  1960    Social History   Social History  . Marital status: Married    Spouse name: N/A  . Number of children: N/A  . Years of education: N/A   Social History Main Topics  . Smoking status: Never Smoker  . Smokeless tobacco: Never Used  . Alcohol use No  . Drug use: No  . Sexual activity: Not on file   Other Topics Concern  . Not on file   Social History Narrative   Married, lives with spouse. Retired Licensed conveyancer, Print production planner. Has 3 kids (moved to [place] to be closer)- youngest son MD. Dorie Rank to  from [place] 06/2010, lived in [place] x 10years    Family History  Problem Relation Age of Onset  . Hypertension Mother   . Alzheimer's disease Mother 24  . AAA (abdominal aortic aneurysm) Mother   . Stroke Father 62  . Colon cancer Neg Hx   . Bone cancer Brother 19    Review of Systems     Objective:   Vitals:   09/15/16 1604  BP: (!) 144/84  Pulse: 78  Resp: 16  Temp: 98 F (36.7 C)   Filed Weights   09/15/16 1604  Weight: 145 lb (65.8 kg)   Body mass index is 25.69 kg/m.   Physical Exam GENERAL APPEARANCE: Appears stated age, well appearing, NAD EYES: conjunctiva clear, no icterus HEENT: left ear canal and TM normal.  Right ear canal obstructed by cerumen, which after verbal consent was cleared out of most cerumen with manual debridement.  Hearing improved.  No lavage was used.  20% ear wax remaining.  TM on right norma. oropharynx with no erythema, no thyromegaly, trachea midline, no cervical or supraclavicular lymphadenopathy EXTREMITIES: Without clubbing, cyanosis, or edema      Assessment & Plan:    See Problem List for Assessment and Plan of chronic medical problems.

## 2016-09-15 NOTE — Patient Instructions (Signed)
Your ear was cleaned out today.   

## 2016-11-19 ENCOUNTER — Other Ambulatory Visit: Payer: Self-pay | Admitting: Internal Medicine

## 2016-12-02 DIAGNOSIS — H9011 Conductive hearing loss, unilateral, right ear, with unrestricted hearing on the contralateral side: Secondary | ICD-10-CM | POA: Diagnosis not present

## 2016-12-02 DIAGNOSIS — H6121 Impacted cerumen, right ear: Secondary | ICD-10-CM | POA: Diagnosis not present

## 2016-12-02 DIAGNOSIS — J343 Hypertrophy of nasal turbinates: Secondary | ICD-10-CM | POA: Diagnosis not present

## 2016-12-02 HISTORY — DX: Conductive hearing loss, unilateral, right ear, with unrestricted hearing on the contralateral side: H90.11

## 2016-12-04 ENCOUNTER — Encounter: Payer: PPO | Admitting: Internal Medicine

## 2016-12-14 ENCOUNTER — Other Ambulatory Visit: Payer: Self-pay | Admitting: Cardiovascular Disease

## 2016-12-26 ENCOUNTER — Encounter: Payer: Self-pay | Admitting: Internal Medicine

## 2016-12-26 ENCOUNTER — Other Ambulatory Visit (INDEPENDENT_AMBULATORY_CARE_PROVIDER_SITE_OTHER): Payer: PPO

## 2016-12-26 ENCOUNTER — Ambulatory Visit (HOSPITAL_COMMUNITY)
Admission: RE | Admit: 2016-12-26 | Discharge: 2016-12-26 | Disposition: A | Discharge status: Home / Self Care | Payer: PPO | Source: Ambulatory Visit | Attending: Internal Medicine | Admitting: Internal Medicine

## 2016-12-26 ENCOUNTER — Ambulatory Visit (INDEPENDENT_AMBULATORY_CARE_PROVIDER_SITE_OTHER): Payer: PPO | Admitting: Internal Medicine

## 2016-12-26 ENCOUNTER — Other Ambulatory Visit: Payer: Self-pay | Admitting: Internal Medicine

## 2016-12-26 VITALS — BP 124/82 | HR 59 | Temp 97.8°F | Resp 16 | Wt 146.0 lb

## 2016-12-26 DIAGNOSIS — N281 Cyst of kidney, acquired: Secondary | ICD-10-CM | POA: Diagnosis not present

## 2016-12-26 DIAGNOSIS — R1010 Upper abdominal pain, unspecified: Secondary | ICD-10-CM

## 2016-12-26 DIAGNOSIS — K7689 Other specified diseases of liver: Secondary | ICD-10-CM | POA: Diagnosis not present

## 2016-12-26 DIAGNOSIS — R1011 Right upper quadrant pain: Secondary | ICD-10-CM

## 2016-12-26 LAB — CBC WITH DIFFERENTIAL/PLATELET
BASOS ABS: 0.1 10*3/uL (ref 0.0–0.1)
BASOS PCT: 0.7 % (ref 0.0–3.0)
EOS ABS: 0.1 10*3/uL (ref 0.0–0.7)
Eosinophils Relative: 1.8 % (ref 0.0–5.0)
HEMATOCRIT: 43.4 % (ref 36.0–46.0)
Hemoglobin: 14.4 g/dL (ref 12.0–15.0)
LYMPHS ABS: 2.2 10*3/uL (ref 0.7–4.0)
Lymphocytes Relative: 31 % (ref 12.0–46.0)
MCHC: 33.2 g/dL (ref 30.0–36.0)
MCV: 97.7 fl (ref 78.0–100.0)
MONO ABS: 0.6 10*3/uL (ref 0.1–1.0)
Monocytes Relative: 9 % (ref 3.0–12.0)
NEUTROS ABS: 4 10*3/uL (ref 1.4–7.7)
NEUTROS PCT: 57.5 % (ref 43.0–77.0)
PLATELETS: 221 10*3/uL (ref 150.0–400.0)
RBC: 4.44 Mil/uL (ref 3.87–5.11)
RDW: 13.5 % (ref 11.5–15.5)
WBC: 7.1 10*3/uL (ref 4.0–10.5)

## 2016-12-26 LAB — LIPID PANEL
CHOL/HDL RATIO: 3
Cholesterol: 143 mg/dL (ref 0–200)
HDL: 46.8 mg/dL (ref >39.00)
LDL CALC: 64 mg/dL (ref 0–99)
NONHDL: 96.29
Triglycerides: 159 mg/dL — ABNORMAL HIGH (ref 0.0–149.0)
VLDL: 31.8 mg/dL (ref 0.0–40.0)

## 2016-12-26 LAB — COMPREHENSIVE METABOLIC PANEL
ALT: 24 U/L (ref 0–35)
AST: 22 U/L (ref 0–37)
Albumin: 4.1 g/dL (ref 3.5–5.2)
Alkaline Phosphatase: 53 U/L (ref 39–117)
BUN: 23 mg/dL (ref 6–23)
CHLORIDE: 106 meq/L (ref 96–112)
CO2: 27 meq/L (ref 19–32)
CREATININE: 0.79 mg/dL (ref 0.40–1.20)
Calcium: 9.4 mg/dL (ref 8.4–10.5)
GFR: 76.13 mL/min (ref >60.00)
GLUCOSE: 119 mg/dL — AB (ref 70–99)
Potassium: 3.9 mEq/L (ref 3.5–5.1)
Sodium: 140 mEq/L (ref 135–145)
Total Bilirubin: 0.5 mg/dL (ref 0.2–1.2)
Total Protein: 6.8 g/dL (ref 6.0–8.3)

## 2016-12-26 LAB — TSH: TSH: 0.92 u[IU]/mL (ref 0.35–4.50)

## 2016-12-26 NOTE — Patient Instructions (Signed)
Blood work was ordered. An ultrasound of your abdomen was ordered.  [facility] will call you to schedule this.      Medications reviewed and updated.  No changes recommended at this time.

## 2016-12-26 NOTE — Progress Notes (Signed)
Subjective:    Patient ID: [name] [name], female    DOB: [birth year], 71 y.o.   MRN: [id number]  HPI She is here for an acute visit.   Two nights ago she had a terrible stomach pain in her right upper abdomen that radiated to the epigastric region. The pain started around 8 pm - her last meal prior was 2pm. She did have something greay and in the past when she has eaten something greasy she has had similar pain. It is less now, but still there. The pain varies in intensity.  She feels bloated.   She has had more frequent BM but the stool is soft.   She denies blood in the stool, fever and GERD.  There is no increase in pain with eating.   Start taking Pepto - which  has helped with the pain.        She has GERD.  She takes protonix daily, but forgets only on occasion. She feels her GERD is typically well controlled.   Medications and allergies reviewed with patient and updated if appropriate.  Patient Active Problem List   Diagnosis Date Noted  . Chronic midline low back pain without sciatica 09/15/2016  . Decreased hearing of right ear 09/15/2016  . Osteopenia 07/20/2016  . Bilateral finger numbness 06/03/2016  . Joint pain 06/03/2016  . Contusion of right leg 04/03/2016  . Left upper arm pain 12/20/2015  . Atypical chest pain 02/09/2015  . Hyperlipidemia 02/09/2015  . Primary localized osteoarthrosis, lower leg 02/22/2014  . Osteoarthritis of right knee 10/03/2013  . Lateral meniscal tear 10/03/2013  . 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 Varicose vein of leg   . Knee pain, bilateral 05/03/2013  . Allergic rhinitis   . IBS (irritable bowel syndrome) 04/07/2011  . OTHER DYSPHAGIA 11/01/2010  . Migraine headache 09/27/2010  . Essential hypertension 09/27/2010  . GERD 09/27/2010  . OSTEOARTHRITIS 09/27/2010  . HEADACHE 09/27/2010  . RENAL CALCULUS, HX OF 09/27/2010    Current Outpatient Prescriptions on File Prior to Visit  Medication Sig Dispense Refill  .  aspirin 81 MG tablet Take 81 mg by mouth 3 (three) times a week. Monday, Wednesday, and Fridays    . atorvastatin (LIPITOR) 40 MG tablet TAKE ONE-HALF TABLET BY MOUTH ONCE DAILY 45 tablet 1  . butalbital-acetaminophen-caffeine (FIORICET, ESGIC) 50-325-40 MG tablet Take 1 tablet by mouth every 4 (four) hours as needed for headache. 30 tablet 0  . calcium carbonate (OS-CAL) 600 MG tablet Take 1 tablet (600 mg total) by mouth daily. 60 tablet   . cloNIDine (CATAPRES) 0.1 MG tablet 0.1 mg tab by mouth as needed for blood pressure over 160/90; may repeat in 1 hour if needed 30 tablet 3  . losartan (COZAAR) 25 MG tablet TAKE ONE TABLET BY MOUTH ONCE DAILY 90 tablet 2  . metoprolol succinate (TOPROL-XL) 50 MG 24 hr tablet TAKE ONE TABLET BY MOUTH ONCE DAILY (Patient taking differently: TAKE ONE/HALF TABLET BY MOUTH ONCE DAILY) 90 tablet 1  . pantoprazole (PROTONIX) 40 MG tablet Take 1 tablet (40 mg total) by mouth daily. 90 tablet 0   No current facility-administered medications on file prior to visit.     Past Medical History:  Diagnosis Date  . Atypical chest pain   . Gastritis 11/08/2007, 04/12/2012   H pylori bx neg 03/2012  . GERD (gastroesophageal reflux disease) 11/08/2007, 04/12/2012  . Hiatal hernia 11/08/2007, 04/12/2012  . History of kidney stones   . Hyperlipidemia   .  Hypertension   . IBS (irritable bowel syndrome)    diarrhea predom  . Migraine   . Osteoporosis 09/27/2010   DEXA 01/21/2012: -2.1 spine and R fem, -1.8 L fem s/p fosamax  2004-2011 DEXA 04/18/14 @ [facility]: -2.5 - rec to start Prolia    . PVC's (premature ventricular contractions)   . 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 Shingles outbreak 04/2013    Past Surgical History:  Procedure Laterality Date  . APPENDECTOMY  1958  . CYSTOCELE REPAIR  2008  . Maxilofacial  1992  . TONSILLECTOMY  1960    Social History   Social History  . Marital status: Married    Spouse name: N/A  . Number  of children: N/A  . Years of education: N/A   Social History Main Topics  . Smoking status: Never Smoker  . Smokeless tobacco: Never Used  . Alcohol use No  . Drug use: No  . Sexual activity: Not on file   Other Topics Concern  . Not on file   Social History Narrative   Married, lives with spouse. Retired Licensed conveyancer, Print production planner. Has 3 kids (moved to [place] to be closer)- youngest son MD. Dorie Rank to [place] from [place] 06/2010, lived in [place] x 10years    Family History  Problem Relation Age of Onset  . Hypertension Mother   . Alzheimer's disease Mother 34  . AAA (abdominal aortic aneurysm) Mother   . Stroke Father 19  . Colon cancer Neg Hx   . Bone cancer Brother 19    Review of Systems  Constitutional: Positive for chills (with pain). Negative for fever.  Respiratory: Negative for cough, shortness of breath and wheezing.   Cardiovascular: Negative for chest pain.  Gastrointestinal: Positive for abdominal pain. Negative for blood in stool, constipation, diarrhea (soft, more frequent) and nausea.  Musculoskeletal: Positive for arthralgias.  Neurological: Positive for numbness (left 3-4th fingers) and headaches.       Objective:   Vitals:   12/26/16 1304  BP: 124/82  Pulse: (!) 59  Resp: 16  Temp: 97.8 F (36.6 C)   Filed Weights   12/26/16 1304  Weight: 146 lb (66.2 kg)   Body mass index is 25.86 kg/m.  Wt Readings from Last 3 Encounters:  12/26/16 146 lb (66.2 kg)  09/15/16 145 lb (65.8 kg)  06/03/16 141 lb (64 kg)     Physical Exam  Constitutional: She appears well-developed and well-nourished. No distress.  Abdominal: Soft. Bowel sounds are normal. She exhibits no distension and no mass. There is tenderness (RUQ and epigastric region). There is no rebound and no guarding.  Skin: She is not diaphoretic.       Assessment & Plan:   See Problem List for Assessment and Plan of chronic medical problems.

## 2016-12-26 NOTE — Progress Notes (Signed)
Pre visit review using our clinic review tool, if applicable. No additional management support is needed unless otherwise documented below in the visit note. 

## 2016-12-27 ENCOUNTER — Other Ambulatory Visit: Payer: Self-pay | Admitting: Cardiovascular Disease

## 2016-12-29 NOTE — Telephone Encounter (Signed)
Rx(s) sent to pharmacy electronically.  

## 2016-12-30 ENCOUNTER — Encounter: Payer: Self-pay | Admitting: Internal Medicine

## 2016-12-30 ENCOUNTER — Ambulatory Visit (INDEPENDENT_AMBULATORY_CARE_PROVIDER_SITE_OTHER): Payer: PPO | Admitting: Internal Medicine

## 2016-12-30 VITALS — BP 124/86 | HR 72 | Temp 97.7°F | Resp 16 | Wt 143.0 lb

## 2016-12-30 DIAGNOSIS — I10 Essential (primary) hypertension: Secondary | ICD-10-CM | POA: Diagnosis not present

## 2016-12-30 DIAGNOSIS — Z0000 Encounter for general adult medical examination without abnormal findings: Secondary | ICD-10-CM | POA: Diagnosis not present

## 2016-12-30 DIAGNOSIS — K219 Gastro-esophageal reflux disease without esophagitis: Secondary | ICD-10-CM

## 2016-12-30 DIAGNOSIS — M79622 Pain in left upper arm: Secondary | ICD-10-CM

## 2016-12-30 MED ORDER — PANTOPRAZOLE SODIUM 40 MG PO TBEC: 40.0000 mg | DELAYED_RELEASE_TABLET | Freq: Every day | ORAL | 3 refills | Status: DC

## 2016-12-30 MED ORDER — SUCRALFATE 1 GM/10ML PO SUSP: 1.0000 g | Freq: Three times a day (TID) | ORAL | 0 refills | Status: DC

## 2016-12-30 MED ORDER — PANTOPRAZOLE SODIUM 40 MG PO TBEC: 40.0000 mg | DELAYED_RELEASE_TABLET | Freq: Two times a day (BID) | ORAL | 3 refills | Status: DC

## 2016-12-30 NOTE — Assessment & Plan Note (Signed)
BP well controlled Current regimen effective and well tolerated Continue current medications at current doses  

## 2016-12-30 NOTE — Assessment & Plan Note (Signed)
Pain now focused more in epigastric region HIDA scan scheduled for two days from now - I think this will be negative, but GB dysfunction needs to be ruled out Likely gastritis, which she has had in the past Increase protonix to twice daily -- hope to decrease back to once daily after a few months Start carafate for quick relief of abdominal pain

## 2016-12-30 NOTE — Progress Notes (Signed)
Pre visit review using our clinic review tool, if applicable. No additional management support is needed unless otherwise documented below in the visit note. 

## 2016-12-30 NOTE — Assessment & Plan Note (Signed)
occ GERD, but likely has gastritis Inc protonix to BID Start carafate

## 2016-12-30 NOTE — Patient Instructions (Signed)
All other Health Maintenance issues reviewed.   All recommended immunizations and age-appropriate screenings are up-to-date or discussed.  No immunizations administered today.   Medications reviewed and updated.  Changes include increasing protonix to twice a day.  Start carafate liquid three times a day before meals and before bed.   Your prescription(s) have been submitted to your pharmacy. Please take as directed and contact our office if you believe you are having problem(s) with the medication(s).     Health Maintenance, Female Introduction Adopting a healthy lifestyle and getting preventive care can go a long way to promote health and wellness. Talk with your health care provider about what schedule of regular examinations is right for you. This is a good chance for you to check in with your provider about disease prevention and staying healthy. In between checkups, there are plenty of things you can do on your own. Experts have done a lot of research about which lifestyle changes and preventive measures are most likely to keep you healthy. Ask your health care provider for more information. Weight and diet Eat a healthy diet  Be sure to include plenty of vegetables, fruits, low-fat dairy products, and lean protein.  Do not eat a lot of foods high in solid fats, added sugars, or salt.  Get regular exercise. This is one of the most important things you can do for your health.  Most adults should exercise for at least 150 minutes each week. The exercise should increase your heart rate and make you sweat (moderate-intensity exercise).  Most adults should also do strengthening exercises at least twice a week. This is in addition to the moderate-intensity exercise. Maintain a healthy weight  Body mass index (BMI) is a measurement that can be used to identify possible weight problems. It estimates body fat based on height and weight. Your health care provider can help determine  your BMI and help you achieve or maintain a healthy weight.  For females 70 years of age and older:  A BMI below 18.5 is considered underweight.  A BMI of 18.5 to 24.9 is normal.  A BMI of 25 to 29.9 is considered overweight.  A BMI of 30 and above is considered obese. Watch levels of cholesterol and blood lipids  You should start having your blood tested for lipids and cholesterol at 72 years of age, then have this test every 5 years.  You may need to have your cholesterol levels checked more often if:  Your lipid or cholesterol levels are high.  You are older than 72 years of age.  You are at high risk for heart disease. Cancer screening Lung Cancer  Lung cancer screening is recommended for adults 54-11 years old who are at high risk for lung cancer because of a history of smoking.  A yearly low-dose CT scan of the lungs is recommended for people who:  Currently smoke.  Have quit within the past 15 years.  Have at least a 30-pack-year history of smoking. A pack year is smoking an average of one pack of cigarettes a day for 1 year.  Yearly screening should continue until it has been 15 years since you quit.  Yearly screening should stop if you develop a health problem that would prevent you from having lung cancer treatment. Breast Cancer  Practice breast self-awareness. This means understanding how your breasts normally appear and feel.  It also means doing regular breast self-exams. Let your health care provider know about any changes, no  matter how small.  If you are in your 20s or 30s, you should have a clinical breast exam (CBE) by a health care provider every 1-3 years as part of a regular health exam.  If you are 63 or older, have a CBE every year. Also consider having a breast X-ray (mammogram) every year.  If you have a family history of breast cancer, talk to your health care provider about genetic screening.  If you are at high risk for breast cancer,  talk to your health care provider about having an MRI and a mammogram every year.  Breast cancer gene (BRCA) assessment is recommended for women who have family members with BRCA-related cancers. BRCA-related cancers include:  Breast.  Ovarian.  Tubal.  Peritoneal cancers.  Results of the assessment will determine the need for genetic counseling and BRCA1 and BRCA2 testing. Cervical Cancer  Your health care provider may recommend that you be screened regularly for cancer of the pelvic organs (ovaries, uterus, and vagina). This screening involves a pelvic examination, including checking for microscopic changes to the surface of your cervix (Pap test). You may be encouraged to have this screening done every 3 years, beginning at age 6.  For women ages 72-65, health care providers may recommend pelvic exams and Pap testing every 3 years, or they may recommend the Pap and pelvic exam, combined with testing for human papilloma virus (HPV), every 5 years. Some types of HPV increase your risk of cervical cancer. Testing for HPV may also be done on women of any age with unclear Pap test results.  Other health care providers may not recommend any screening for nonpregnant women who are considered low risk for pelvic cancer and who do not have symptoms. Ask your health care provider if a screening pelvic exam is right for you.  If you have had past treatment for cervical cancer or a condition that could lead to cancer, you need Pap tests and screening for cancer for at least 20 years after your treatment. If Pap tests have been discontinued, your risk factors (such as having a new sexual partner) need to be reassessed to determine if screening should resume. Some women have medical problems that increase the chance of getting cervical cancer. In these cases, your health care provider may recommend more frequent screening and Pap tests. Colorectal Cancer  This type of cancer can be detected and often  prevented.  Routine colorectal cancer screening usually begins at 72 years of age and continues through 72 years of age.  Your health care provider may recommend screening at an earlier age if you have risk factors for colon cancer.  Your health care provider may also recommend using home test kits to check for hidden blood in the stool.  A small camera at the end of a tube can be used to examine your colon directly (sigmoidoscopy or colonoscopy). This is done to check for the earliest forms of colorectal cancer.  Routine screening usually begins at age 77.  Direct examination of the colon should be repeated every 5-10 years through 72 years of age. However, you may need to be screened more often if early forms of precancerous polyps or small growths are found. Skin Cancer  Check your skin from head to toe regularly.  Tell your health care provider about any new moles or changes in moles, especially if there is a change in a mole's shape or color.  Also tell your health care provider if you have a mole  that is larger than the size of a pencil eraser.  Always use sunscreen. Apply sunscreen liberally and repeatedly throughout the day.  Protect yourself by wearing long sleeves, pants, a wide-brimmed hat, and sunglasses whenever you are outside. Heart disease, diabetes, and high blood pressure  High blood pressure causes heart disease and increases the risk of stroke. High blood pressure is more likely to develop in:  People who have blood pressure in the high end of the normal range (130-139/85-89 mm Hg).  People who are overweight or obese.  People who are African American.  If you are 81-62 years of age, have your blood pressure checked every 3-5 years. If you are 41 years of age or older, have your blood pressure checked every year. You should have your blood pressure measured twice-once when you are at a hospital or clinic, and once when you are not at a hospital or clinic. Record  the average of the two measurements. To check your blood pressure when you are not at a hospital or clinic, you can use:  An automated blood pressure machine at a pharmacy.  A home blood pressure monitor.  If you are between 27 years and 40 years old, ask your health care provider if you should take aspirin to prevent strokes.  Have regular diabetes screenings. This involves taking a blood sample to check your fasting blood sugar level.  If you are at a normal weight and have a low risk for diabetes, have this test once every three years after 72 years of age.  If you are overweight and have a high risk for diabetes, consider being tested at a younger age or more often. Preventing infection Hepatitis B  If you have a higher risk for hepatitis B, you should be screened for this virus. You are considered at high risk for hepatitis B if:  You were born in a country where hepatitis B is common. Ask your health care provider which countries are considered high risk.  Your parents were born in a high-risk country, and you have not been immunized against hepatitis B (hepatitis B vaccine).  You have HIV or AIDS.  You use needles to inject street drugs.  You live with someone who has hepatitis B.  You have had sex with someone who has hepatitis B.  You get hemodialysis treatment.  You take certain medicines for conditions, including cancer, organ transplantation, and autoimmune conditions. Hepatitis C  Blood testing is recommended for:  Everyone born from [birth year] through 1965.  Anyone with known risk factors for hepatitis C. Sexually transmitted infections (STIs)  You should be screened for sexually transmitted infections (STIs) including gonorrhea and chlamydia if:  You are sexually active and are younger than 72 years of age.  You are older than 72 years of age and your health care provider tells you that you are at risk for this type of infection.  Your sexual activity has  changed since you were last screened and you are at an increased risk for chlamydia or gonorrhea. Ask your health care provider if you are at risk.  If you do not have HIV, but are at risk, it may be recommended that you take a prescription medicine daily to prevent HIV infection. This is called pre-exposure prophylaxis (PrEP). You are considered at risk if:  You are sexually active and do not regularly use condoms or know the HIV status of your partner(s).  You take drugs by injection.  You are sexually active with  a partner who has HIV. Talk with your health care provider about whether you are at high risk of being infected with HIV. If you choose to begin PrEP, you should first be tested for HIV. You should then be tested every 3 months for as long as you are taking PrEP. Pregnancy  If you are premenopausal and you may become pregnant, ask your health care provider about preconception counseling.  If you may become pregnant, take 400 to 800 micrograms (mcg) of folic acid every day.  If you want to prevent pregnancy, talk to your health care provider about birth control (contraception). Osteoporosis and menopause  Osteoporosis is a disease in which the bones lose minerals and strength with aging. This can result in serious bone fractures. Your risk for osteoporosis can be identified using a bone density scan.  If you are 70 years of age or older, or if you are at risk for osteoporosis and fractures, ask your health care provider if you should be screened.  Ask your health care provider whether you should take a calcium or vitamin D supplement to lower your risk for osteoporosis.  Menopause may have certain physical symptoms and risks.  Hormone replacement therapy may reduce some of these symptoms and risks. Talk to your health care provider about whether hormone replacement therapy is right for you. Follow these instructions at home:  Schedule regular health, dental, and eye  exams.  Stay current with your immunizations.  Do not use any tobacco products including cigarettes, chewing tobacco, or electronic cigarettes.  If you are pregnant, do not drink alcohol.  If you are breastfeeding, limit how much and how often you drink alcohol.  Limit alcohol intake to no more than 1 drink per day for nonpregnant women. One drink equals 12 ounces of beer, 5 ounces of wine, or 1 ounces of hard liquor.  Do not use street drugs.  Do not share needles.  Ask your health care provider for help if you need support or information about quitting drugs.  Tell your health care provider if you often feel depressed.  Tell your health care provider if you have ever been abused or do not feel safe at home. This information is not intended to replace advice given to you by your health care provider. Make sure you discuss any questions you have with your health care provider. Document Released: 05/26/2011 Document Revised: 04/17/2016 Document Reviewed: 08/14/2015  2017 Elsevier

## 2016-12-30 NOTE — Progress Notes (Signed)
Subjective:    Patient ID: [name] [name], female    DOB: 06-06-1946, 71 y.o.   MRN: [id number]  HPI She is here for a physical exam.   Epigastric pain - stabbing pain.  Worse with eating.  It is worse at night.  She feels the pain is more focused in the epigastric region since last week.  She does have a history of gastritis.  She has a small hiatal hernia.  The gastritis has been flared in the past by stress and she has had increased stress.   She leaves in a few days to go to [place] and will be there for three months.   She has no other concerns.   Medications and allergies reviewed with patient and updated if appropriate.  Patient Active Problem List   Diagnosis Date Noted  . Chronic midline low back pain without sciatica 09/15/2016  . Decreased hearing of right ear 09/15/2016  . Osteopenia 07/20/2016  . Bilateral finger numbness 06/03/2016  . Joint pain 06/03/2016  . Contusion of right leg 04/03/2016  . Left upper arm pain 12/20/2015  . Atypical chest pain 02/09/2015  . Hyperlipidemia 02/09/2015  . Primary localized osteoarthrosis, lower leg 02/22/2014  . Osteoarthritis of right knee 10/03/2013  . Lateral meniscal tear 10/03/2013  . 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 Varicose vein of leg   . Knee pain, bilateral 05/03/2013  . Allergic rhinitis   . IBS (irritable bowel syndrome) 04/07/2011  . OTHER DYSPHAGIA 11/01/2010  . Migraine headache 09/27/2010  . Essential hypertension 09/27/2010  . GERD 09/27/2010  . OSTEOARTHRITIS 09/27/2010  . HEADACHE 09/27/2010  . RENAL CALCULUS, HX OF 09/27/2010    Current Outpatient Prescriptions on File Prior to Visit  Medication Sig Dispense Refill  . aspirin 81 MG tablet Take 81 mg by mouth 3 (three) times a week. Monday, Wednesday, and Fridays    . atorvastatin (LIPITOR) 40 MG tablet TAKE ONE-HALF TABLET BY MOUTH ONCE DAILY 45 tablet 1  . butalbital-acetaminophen-caffeine (FIORICET, ESGIC) 50-325-40 MG tablet Take 1  tablet by mouth every 4 (four) hours as needed for headache. 30 tablet 0  . calcium carbonate (OS-CAL) 600 MG tablet Take 1 tablet (600 mg total) by mouth daily. 60 tablet   . Cholecalciferol (VITAMIN D3) 1000 units CAPS Take 1,000 Units by mouth daily.    . cloNIDine (CATAPRES) 0.1 MG tablet 0.1 mg tab by mouth as needed for blood pressure over 160/90; may repeat in 1 hour if needed 30 tablet 3  . losartan (COZAAR) 25 MG tablet TAKE ONE TABLET BY MOUTH ONCE DAILY 90 tablet 2  . metoprolol succinate (TOPROL-XL) 50 MG 24 hr tablet TAKE ONE TABLET BY MOUTH ONCE DAILY (Patient taking differently: TAKE ONE/HALF TABLET BY MOUTH ONCE DAILY) 90 tablet 1   No current facility-administered medications on file prior to visit.     Past Medical History:  Diagnosis Date  . Atypical chest pain   . Gastritis 11/08/2007, 04/12/2012   H pylori bx neg 03/2012  . GERD (gastroesophageal reflux disease) 11/08/2007, 04/12/2012  . Hiatal hernia 11/08/2007, 04/12/2012  . History of kidney stones   . Hyperlipidemia   . Hypertension   . IBS (irritable bowel syndrome)    diarrhea predom  . Migraine   . Osteoporosis 09/27/2010   DEXA 01/21/2012: -2.1 spine and R fem, -1.8 L fem s/p fosamax  2004-2011 DEXA 04/18/14 @ [facility]: -2.5 - rec to start Prolia    . PVC's (premature ventricular contractions)   .  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 Shingles outbreak 04/2013    Past Surgical History:  Procedure Laterality Date  . APPENDECTOMY  1958  . CYSTOCELE REPAIR  2008  . Maxilofacial  1992  . TONSILLECTOMY  1960    Social History   Social History  . Marital status: Married    Spouse name: N/A  . Number of children: N/A  . Years of education: N/A   Social History Main Topics  . Smoking status: Never Smoker  . Smokeless tobacco: Never Used  . Alcohol use No  . Drug use: No  . Sexual activity: Not Asked   Other Topics Concern  . None   Social History Narrative   Married,  lives with spouse. Retired Licensed conveyancer, Print production planner. Has 3 kids (moved to [place] to be closer)- youngest son MD. Dorie Rank to [place] from [place] 06/2010, lived in [place] x 10years    Family History  Problem Relation Age of Onset  . Hypertension Mother   . Alzheimer's disease Mother 74  . AAA (abdominal aortic aneurysm) Mother   . Stroke Father 50  . Bone cancer Brother 36  . Colon cancer Neg Hx     Review of Systems  Constitutional: Negative for appetite change and fever.  HENT: Negative for trouble swallowing.   Eyes: Negative for visual disturbance.  Respiratory: Negative for cough, shortness of breath and wheezing.   Cardiovascular: Positive for palpitations (occ). Negative for chest pain and leg swelling.  Gastrointestinal: Positive for abdominal pain. Negative for blood in stool, constipation, diarrhea and nausea.       Occ GERD  Genitourinary: Negative for dysuria and hematuria.  Musculoskeletal: Positive for arthralgias.  Skin: Negative for color change and rash.  Neurological: Positive for headaches (occ). Negative for dizziness and light-headedness.  Psychiatric/Behavioral: Negative for dysphoric mood. The patient is not nervous/anxious.        Objective:   Vitals:   12/30/16 0838  BP: 124/86  Pulse: 72  Resp: 16  Temp: 97.7 F (36.5 C)   Filed Weights   12/30/16 0838  Weight: 143 lb (64.9 kg)   Body mass index is 25.33 kg/m.  Wt Readings from Last 3 Encounters:  12/30/16 143 lb (64.9 kg)  12/26/16 146 lb (66.2 kg)  09/15/16 145 lb (65.8 kg)     Physical Exam Constitutional: She appears well-developed and well-nourished. No distress.  HENT:  Head: Normocephalic and atraumatic.  Right Ear: External ear normal. Normal ear canal and TM Left Ear: External ear normal.  Normal ear canal and TM Mouth/Throat: Oropharynx is clear and moist.  Eyes: Conjunctivae and EOM are normal.  Neck: Neck supple. No tracheal deviation present. No thyromegaly present.  No carotid  bruit  Cardiovascular: Normal rate, regular rhythm and normal heart sounds.   No murmur heard.  No edema. Pulmonary/Chest: Effort normal and breath sounds normal. No respiratory distress. She has no wheezes. She has no rales.  Breast: deferred   Abdominal: Soft. She exhibits no distension. There is tenderness in her epigastric region w/o rebound or guarding, no masses  Lymphadenopathy: She has no cervical adenopathy.  Skin: Skin is warm and dry. She is not diaphoretic.  Psychiatric: She has a normal mood and affect. Her behavior is normal.         Assessment & Plan:   Physical exam: Screening blood work  reviewed Immunizations  zostavax discussed, other vaccines up to date Colonoscopy  Up to date  Mammogram  -  last 2013 - deferred additional mammograms Dexa  Up to date  Eye exams  Up to date  EKG - last EKG 2016 Exercise - none currently - stressed regular exercise Weight - BMI acceptable for age Skin        no concerns Substance abuse      none  See Problem List for Assessment and Plan of chronic medical problems.  FU in the summer

## 2017-01-01 ENCOUNTER — Encounter (HOSPITAL_COMMUNITY)
Admission: RE | Admit: 2017-01-01 | Discharge: 2017-01-01 | Disposition: A | Discharge status: Home / Self Care | Payer: PPO | Source: Ambulatory Visit | Attending: Internal Medicine | Admitting: Internal Medicine

## 2017-01-01 DIAGNOSIS — R1011 Right upper quadrant pain: Secondary | ICD-10-CM | POA: Diagnosis not present

## 2017-01-01 MED ORDER — TECHNETIUM TC 99M MEBROFENIN IV KIT
5.1000 | PACK | Freq: Once | INTRAVENOUS | Status: AC | PRN
  Administered 2017-01-01: 5.1 via INTRAVENOUS

## 2017-01-01 MED ORDER — SINCALIDE 5 MCG IJ SOLR
0.0200 ug/kg | Freq: Once | INTRAMUSCULAR | Status: AC
  Administered 2017-01-01: 1.3 ug via INTRAVENOUS

## 2017-04-02 DIAGNOSIS — H00024 Hordeolum internum left upper eyelid: Secondary | ICD-10-CM | POA: Diagnosis not present

## 2017-04-14 ENCOUNTER — Encounter: Payer: Self-pay | Admitting: Nurse Practitioner

## 2017-04-14 ENCOUNTER — Ambulatory Visit (INDEPENDENT_AMBULATORY_CARE_PROVIDER_SITE_OTHER): Payer: PPO | Admitting: Nurse Practitioner

## 2017-04-14 VITALS — BP 146/84 | HR 70 | Temp 98.0°F | Ht 63.0 in | Wt 145.0 lb

## 2017-04-14 DIAGNOSIS — H00024 Hordeolum internum left upper eyelid: Secondary | ICD-10-CM

## 2017-04-14 DIAGNOSIS — R1013 Epigastric pain: Secondary | ICD-10-CM

## 2017-04-14 MED ORDER — NEOMYCIN-BACITRACIN ZN-POLYMYX 5-400-10000 OP OINT: 1.0000 "application " | TOPICAL_OINTMENT | Freq: Four times a day (QID) | OPHTHALMIC | 0 refills | Status: DC

## 2017-04-14 MED ORDER — DICYCLOMINE HCL 20 MG PO TABS: 20.0000 mg | ORAL_TABLET | Freq: Three times a day (TID) | ORAL | 0 refills | Status: DC | PRN

## 2017-04-14 NOTE — Patient Instructions (Addendum)
Continue protonix as prescribed. Try TUMs OTC prior to taking dicyclomine. Dicyclomine prescribed due to lack of insurance coverage on Hyoscyamine.  Stye A stye is a bump on your eyelid caused by a bacterial infection. A stye can form inside the eyelid (internal stye) or outside the eyelid (external stye). An internal stye may be caused by an infected oil-producing gland inside your eyelid. An external stye may be caused by an infection at the base of your eyelash (hair follicle). Styes are very common. Anyone can get them at any age. They usually occur in just one eye, but you may have more than one in either eye. What are the causes? The infection is almost always caused by bacteria called Staphylococcus aureus. This is a common type of bacteria that lives on your skin. What increases the risk? You may be at higher risk for a stye if you have had one before. You may also be at higher risk if you have:  Diabetes.  Long-term illness.  Long-term eye redness.  A skin condition called seborrhea.  High fat levels in your blood (lipids). What are the signs or symptoms? Eyelid pain is the most common symptom of a stye. Internal styes are more painful than external styes. Other signs and symptoms may include:  Painful swelling of your eyelid.  A scratchy feeling in your eye.  Tearing and redness of your eye.  Pus draining from the stye. How is this diagnosed? Your health care provider may be able to diagnose a stye just by examining your eye. The health care provider may also check to make sure:  You do not have a fever or other signs of a more serious infection.  The infection has not spread to other parts of your eye or areas around your eye. How is this treated? Most styes will clear up in a few days without treatment. In some cases, you may need to use antibiotic drops or ointment to prevent infection. Your health care provider may have to drain the stye surgically if your stye  is:  Large.  Causing a lot of pain.  Interfering with your vision. This can be done using a thin blade or a needle. Follow these instructions at home:  Take medicines only as directed by your health care provider.  Apply a clean, warm compress to your eye for 10 minutes, 4 times a day.  Do not wear contact lenses or eye makeup until your stye has healed.  Do not try to pop or drain the stye. Contact a health care provider if:  You have chills or a fever.  Your stye does not go away after several days.  Your stye affects your vision.  Your eyeball becomes swollen, red, or painful. This information is not intended to replace advice given to you by your health care provider. Make sure you discuss any questions you have with your health care provider. Document Released: 08/20/2005 Document Revised: 07/06/2016 Document Reviewed: 02/24/2014 Elsevier Interactive Patient Education  2017 [employer].

## 2017-04-14 NOTE — Progress Notes (Signed)
Subjective:  Patient ID: [name] [name], female    DOB: 1946-02-27  Age: 71 y.o. MRN: [id number]  CC: Eye Drainage (left eye discomfort, feels like something inside,little mucus going on for 1 mo)   Eye Problem   The left eye is affected. This is a new problem. The current episode started in the past 7 days. The problem occurs constantly. The problem has been unchanged. There was no injury mechanism. There is no known exposure to pink eye. She does not wear contacts. Associated symptoms include blurred vision, an eye discharge and a foreign body sensation. Pertinent negatives include no double vision, eye redness, fever, itching, nausea, photophobia, recent URI or vomiting. She has tried water for the symptoms. The treatment provided no relief.   GERD: Chronic. Intermittent ABD pain with certain foods.(greasy or spicy) No constipation or diarrhea. No fever, no urinary or vaginal symptoms. she will also like refill for hyoscyamine, not not covered by insurance so will like a substitute) Previous prescribed by Dr. Asa Lente for ABD spasm.  Outpatient Medications Prior to Visit  Medication Sig Dispense Refill  . aspirin 81 MG tablet Take 81 mg by mouth 3 (three) times a week. Monday, Wednesday, and Fridays    . atorvastatin (LIPITOR) 40 MG tablet TAKE ONE-HALF TABLET BY MOUTH ONCE DAILY 45 tablet 1  . butalbital-acetaminophen-caffeine (FIORICET, ESGIC) 50-325-40 MG tablet Take 1 tablet by mouth every 4 (four) hours as needed for headache. 30 tablet 0  . calcium carbonate (OS-CAL) 600 MG tablet Take 1 tablet (600 mg total) by mouth daily. 60 tablet   . Cholecalciferol (VITAMIN D3) 1000 units CAPS Take 1,000 Units by mouth daily.    . cloNIDine (CATAPRES) 0.1 MG tablet 0.1 mg tab by mouth as needed for blood pressure over 160/90; may repeat in 1 hour if needed 30 tablet 3  . losartan (COZAAR) 25 MG tablet TAKE ONE TABLET BY MOUTH ONCE DAILY 90 tablet 2  . metoprolol succinate (TOPROL-XL) 50 MG  24 hr tablet TAKE ONE TABLET BY MOUTH ONCE DAILY (Patient taking differently: TAKE ONE/HALF TABLET BY MOUTH ONCE DAILY) 90 tablet 1  . pantoprazole (PROTONIX) 40 MG tablet Take 1 tablet (40 mg total) by mouth 2 (two) times daily before a meal. 180 tablet 3  . sucralfate (CARAFATE) 1 GM/10ML suspension Take 10 mLs (1 g total) by mouth 4 (four) times daily -  with meals and at bedtime. (Patient not taking: Reported on 04/14/2017) 840 mL 0   No facility-administered medications prior to visit.     ROS See HPI  Objective:  BP (!) 146/84   Pulse 70   Temp 98 F (36.7 C)   Ht 5\' 3"  (1.6 m)   Wt 145 lb (65.8 kg)   SpO2 98%   BMI 25.69 kg/m   BP Readings from Last 3 Encounters:  04/14/17 (!) 146/84  12/30/16 124/86  12/26/16 124/82    Wt Readings from Last 3 Encounters:  04/14/17 145 lb (65.8 kg)  12/30/16 143 lb (64.9 kg)  12/26/16 146 lb (66.2 kg)    Physical Exam  Constitutional: No distress.  Eyes: Conjunctivae and EOM are normal. Pupils are equal, round, and reactive to light. Right eye exhibits no chemosis, no discharge, no exudate and no hordeolum. No foreign body present in the right eye. Left eye exhibits hordeolum. Left eye exhibits no chemosis, no discharge and no exudate. No foreign body present in the left eye. No scleral icterus.    Cardiovascular: Normal rate.  Pulmonary/Chest: Effort normal.  Vitals reviewed.   Lab Results  Component Value Date   WBC 7.1 12/26/2016   HGB 14.4 12/26/2016   HCT 43.4 12/26/2016   PLT 221.0 12/26/2016   GLUCOSE 119 (H) 12/26/2016   CHOL 143 12/26/2016   TRIG 159.0 (H) 12/26/2016   HDL 46.80 12/26/2016   LDLDIRECT 133.0 02/07/2013   LDLCALC 64 12/26/2016   ALT 24 12/26/2016   AST 22 12/26/2016   NA 140 12/26/2016   K 3.9 12/26/2016   CL 106 12/26/2016   CREATININE 0.79 12/26/2016   BUN 23 12/26/2016   CO2 27 12/26/2016   TSH 0.92 12/26/2016    Nm Hepato W/eject Fract  Result Date: 01/01/2017 CLINICAL DATA:  Right  upper quadrant pain for the past week EXAM: NUCLEAR MEDICINE HEPATOBILIARY IMAGING WITH GALLBLADDER EF TECHNIQUE: Sequential images of the abdomen were obtained out to 60 minutes following intravenous administration of radiopharmaceutical. After slow intravenous infusion of 1.3 micrograms Cholecystokinin, gallbladder ejection fraction was determined. RADIOPHARMACEUTICALS:  5.1 mCi Tc-50m Choletec IV COMPARISON:  Abdominal ultrasound of December 26, 2016 FINDINGS: Prompt uptake and biliary excretion of activity by the liver is seen. Gallbladder activity is visualized, consistent with patency of cystic duct. Biliary activity passes into small bowel, consistent with patent common bile duct. Calculated gallbladder ejection fraction is 73%. (At 60 min, normal ejection fraction is greater than 40%.) IMPRESSION: Normal hepatobiliary scan with normal gallbladder ejection fraction determination. Electronically Signed   By: David  Martinique M.D.   On: 01/01/2017 10:43    Assessment & Plan:   [name] was seen today for eye drainage.  Diagnoses and all orders for this visit:  Hordeolum internum of left upper eyelid -     neomycin-bacitracin-polymyxin (NEOSPORIN) ophthalmic ointment; Place 1 application into the left eye 4 (four) times daily.  Dyspepsia -     dicyclomine (BENTYL) 20 MG tablet; Take 1 tablet (20 mg total) by mouth every 8 (eight) hours as needed for spasms.   I am having [name] [name] start on neomycin-bacitracin-polymyxin and dicyclomine. I am also having her maintain her aspirin, cloNIDine, calcium carbonate, butalbital-acetaminophen-caffeine, losartan, metoprolol succinate, atorvastatin, Vitamin D3, pantoprazole, and sucralfate.  Meds ordered this encounter  Medications  . neomycin-bacitracin-polymyxin (NEOSPORIN) ophthalmic ointment    Sig: Place 1 application into the left eye 4 (four) times daily.    Dispense:  3.5 g    Refill:  0    Order Specific Question:   Supervising Provider     Answer:   Cassandria Anger [[id number]]  . dicyclomine (BENTYL) 20 MG tablet    Sig: Take 1 tablet (20 mg total) by mouth every 8 (eight) hours as needed for spasms.    Dispense:  20 tablet    Refill:  0    Order Specific Question:   Supervising Provider    Answer:   Cassandria Anger [[id number]]    Follow-up: Return if symptoms worsen or fail to improve.  Wilfred Lacy, NP

## 2017-06-05 ENCOUNTER — Other Ambulatory Visit: Payer: Self-pay | Admitting: Internal Medicine

## 2017-06-10 ENCOUNTER — Encounter: Payer: Self-pay | Admitting: Internal Medicine

## 2017-06-10 ENCOUNTER — Ambulatory Visit (INDEPENDENT_AMBULATORY_CARE_PROVIDER_SITE_OTHER): Payer: PPO | Admitting: Internal Medicine

## 2017-06-10 ENCOUNTER — Other Ambulatory Visit (INDEPENDENT_AMBULATORY_CARE_PROVIDER_SITE_OTHER): Payer: PPO

## 2017-06-10 VITALS — BP 140/86 | HR 69 | Ht 63.0 in | Wt 147.0 lb

## 2017-06-10 DIAGNOSIS — R252 Cramp and spasm: Secondary | ICD-10-CM | POA: Insufficient documentation

## 2017-06-10 DIAGNOSIS — R202 Paresthesia of skin: Secondary | ICD-10-CM

## 2017-06-10 HISTORY — DX: Cramp and spasm: R25.2

## 2017-06-10 LAB — HEPATIC FUNCTION PANEL
ALT: 20 U/L (ref 0–35)
AST: 18 U/L (ref 0–37)
Albumin: 4.1 g/dL (ref 3.5–5.2)
Alkaline Phosphatase: 50 U/L (ref 39–117)
BILIRUBIN DIRECT: 0.1 mg/dL (ref 0.0–0.3)
BILIRUBIN TOTAL: 0.5 mg/dL (ref 0.2–1.2)
TOTAL PROTEIN: 6.6 g/dL (ref 6.0–8.3)

## 2017-06-10 LAB — BASIC METABOLIC PANEL
BUN: 18 mg/dL (ref 6–23)
CHLORIDE: 103 meq/L (ref 96–112)
CO2: 30 meq/L (ref 19–32)
CREATININE: 0.98 mg/dL (ref 0.40–1.20)
Calcium: 9.6 mg/dL (ref 8.4–10.5)
GFR: 59.29 mL/min — ABNORMAL LOW (ref >60.00)
Glucose, Bld: 117 mg/dL — ABNORMAL HIGH (ref 70–99)
POTASSIUM: 3.7 meq/L (ref 3.5–5.1)
Sodium: 139 mEq/L (ref 135–145)

## 2017-06-10 LAB — CBC WITH DIFFERENTIAL/PLATELET
BASOS PCT: 0.7 % (ref 0.0–3.0)
Basophils Absolute: 0.1 10*3/uL (ref 0.0–0.1)
EOS ABS: 0.2 10*3/uL (ref 0.0–0.7)
Eosinophils Relative: 3 % (ref 0.0–5.0)
HEMATOCRIT: 40.4 % (ref 36.0–46.0)
Hemoglobin: 13.5 g/dL (ref 12.0–15.0)
LYMPHS ABS: 2.1 10*3/uL (ref 0.7–4.0)
LYMPHS PCT: 25.7 % (ref 12.0–46.0)
MCHC: 33.5 g/dL (ref 30.0–36.0)
MCV: 98.7 fl (ref 78.0–100.0)
MONO ABS: 0.8 10*3/uL (ref 0.1–1.0)
Monocytes Relative: 10.4 % (ref 3.0–12.0)
NEUTROS ABS: 4.9 10*3/uL (ref 1.4–7.7)
NEUTROS PCT: 60.2 % (ref 43.0–77.0)
PLATELETS: 218 10*3/uL (ref 150.0–400.0)
RBC: 4.09 Mil/uL (ref 3.87–5.11)
RDW: 13.4 % (ref 11.5–15.5)
WBC: 8.1 10*3/uL (ref 4.0–10.5)

## 2017-06-10 LAB — MAGNESIUM: MAGNESIUM: 2.1 mg/dL (ref 1.5–2.5)

## 2017-06-10 LAB — TSH: TSH: 0.69 u[IU]/mL (ref 0.35–4.50)

## 2017-06-10 MED ORDER — TIZANIDINE HCL 4 MG PO TABS: ORAL_TABLET | ORAL | 2 refills | Status: DC

## 2017-06-10 NOTE — Patient Instructions (Signed)
Please try wearing wrist splints at night only for the left and right wrists  You will be contacted regarding the referral for: Nerve test  Please try not to overuse the hands  Please take all new medication as prescribed - the tizanidine for cramps at night  Please continue all other medications as before, and refills have been done if requested.  Please have the pharmacy call with any other refills you may need.  Please keep your appointments with your specialists as you may have planned  Please go to the LAB in the Basement (turn left off the elevator) for the tests to be done today  You will be contacted by phone if any changes need to be made immediately.  Otherwise, you will receive a letter about your results with an explanation, but please check with MyChart first.  Please remember to sign up for MyChart if you have not done so, as this will be important to you in the future with finding out test results, communicating by private email, and scheduling acute appointments online when needed.

## 2017-06-13 NOTE — Assessment & Plan Note (Signed)
Mild, likely overuse related, for lab evaluation as ordered

## 2017-06-13 NOTE — Progress Notes (Signed)
Subjective:    Patient ID: [name] [name], female    DOB: 13-Jan-1946, 71 y.o.   MRN: [id number]  HPI  Pt here with c/o bilat left and right hand numbness to the medial aspects only with 4th and 5th fingers numbness, without weakness or pain.  Is mild to mod, but constant for > 1 wk, and no prior hx of same. Nothing seems to make better or worse and does not endorse increased hand use over usual.  Does also have occasional leg cramps at night, usually after more active during the day, but seems more frequent most nights now.  Also having occasional cramps to the first finger left hand for unclear reason for several wks, mild, random, intermittent where she has to manually extend the finger to relieve the pain. Pt denies chest pain, increased sob or doe, wheezing, orthopnea, PND, increased LE swelling, palpitations, dizziness or syncope. Pt denies new neurological symptoms such as new headache, or facial or extremity weakness or other numbness Past Medical History:  Diagnosis Date  . Atypical chest pain   . Gastritis 11/08/2007, 04/12/2012   H pylori bx neg 03/2012  . GERD (gastroesophageal reflux disease) 11/08/2007, 04/12/2012  . Hiatal hernia 11/08/2007, 04/12/2012  . History of kidney stones   . Hyperlipidemia   . Hypertension   . IBS (irritable bowel syndrome)    diarrhea predom  . Migraine   . Osteoporosis 09/27/2010   DEXA 01/21/2012: -2.1 spine and R fem, -1.8 L fem s/p fosamax  2004-2011 DEXA 04/18/14 @ [facility]: -2.5 - rec to start Prolia    . PVC's (premature ventricular contractions)   . 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 Shingles outbreak 04/2013   Past Surgical History:  Procedure Laterality Date  . APPENDECTOMY  1958  . CYSTOCELE REPAIR  2008  . Maxilofacial  1992  . TONSILLECTOMY  1960    reports that she has never smoked. She has never used smokeless tobacco. She reports that she does not drink alcohol or use drugs. family history includes AAA  (abdominal aortic aneurysm) in her mother; Alzheimer's disease (age of onset: 3) in her mother; Bone cancer (age of onset: 6) in her brother; Hypertension in her mother; Stroke (age of onset: 57) in her father. No Known Allergies Current Outpatient Prescriptions on File Prior to Visit  Medication Sig Dispense Refill  . aspirin 81 MG tablet Take 81 mg by mouth 3 (three) times a week. Monday, Wednesday, and Fridays    . atorvastatin (LIPITOR) 40 MG tablet TAKE ONE-HALF TABLET BY MOUTH ONCE DAILY 45 tablet 1  . butalbital-acetaminophen-caffeine (FIORICET, ESGIC) 50-325-40 MG tablet Take 1 tablet by mouth every 4 (four) hours as needed for headache. 30 tablet 0  . calcium carbonate (OS-CAL) 600 MG tablet Take 1 tablet (600 mg total) by mouth daily. 60 tablet   . Cholecalciferol (VITAMIN D3) 1000 units CAPS Take 1,000 Units by mouth daily.    . cloNIDine (CATAPRES) 0.1 MG tablet 0.1 mg tab by mouth as needed for blood pressure over 160/90; may repeat in 1 hour if needed 30 tablet 3  . losartan (COZAAR) 25 MG tablet TAKE ONE TABLET BY MOUTH ONCE DAILY 90 tablet 2  . metoprolol succinate (TOPROL-XL) 50 MG 24 hr tablet Take 1 tablet (50 mg total) by mouth daily. --- Office visit needed for further refills 90 tablet 0  . pantoprazole (PROTONIX) 40 MG tablet Take 1 tablet (40 mg total) by mouth 2 (two) times  daily before a meal. 180 tablet 3   No current facility-administered medications on file prior to visit.    Review of Systems  Constitutional: Negative for other unusual diaphoresis or sweats HENT: Negative for ear discharge or swelling Eyes: Negative for other worsening visual disturbances Respiratory: Negative for stridor or other swelling  Gastrointestinal: Negative for worsening distension or other blood Genitourinary: Negative for retention or other urinary change Musculoskeletal: Negative for other MSK pain or swelling Skin: Negative for color change or other new lesions Neurological:  Negative for worsening tremors and other numbness  Psychiatric/Behavioral: Negative for worsening agitation or other fatigue \\All  other system neg per pt    Objective:   Physical Exam BP 140/86   Pulse 69   Ht 5\' 3"  (1.6 m)   Wt 147 lb (66.7 kg)   SpO2 98%   BMI 26.04 kg/m  VS noted,  Constitutional: Pt appears in NAD HENT: Head: NCAT.  Right Ear: External ear normal.  Left Ear: External ear normal.  Eyes: . Pupils are equal, round, and reactive to light. Conjunctivae and EOM are normal Nose: without d/c or deformity Neck: Neck supple. Gross normal ROM Cardiovascular: Normal rate and regular rhythm.   Pulmonary/Chest: Effort normal and breath sounds without rales or wheezing.  Neurological: Pt is alert. At baseline orientation, motor 5/5 intact, cn 2-12 intact, sens intact to LT except for decresaed sens to LT to bilat 4th and 5th fingers  Skin: Skin is warm. No rashes, other new lesions, no LE edema, no calf tenderness or leg swelling Psychiatric: Pt behavior is normal without agitation  No other exam findings    Assessment & Plan:

## 2017-06-13 NOTE — Assessment & Plan Note (Signed)
I suspect likely c/w overuse, for lab evaluation as rx, also zanaflex qhs prn,  to f/u any worsening symptoms or concerns

## 2017-06-13 NOTE — Assessment & Plan Note (Signed)
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

## 2017-06-18 DIAGNOSIS — H11043 Peripheral pterygium, stationary, bilateral: Secondary | ICD-10-CM | POA: Diagnosis not present

## 2017-06-18 DIAGNOSIS — H524 Presbyopia: Secondary | ICD-10-CM | POA: Diagnosis not present

## 2017-06-18 DIAGNOSIS — H25813 Combined forms of age-related cataract, bilateral: Secondary | ICD-10-CM | POA: Diagnosis not present

## 2017-06-25 ENCOUNTER — Ambulatory Visit: Payer: PPO | Admitting: Internal Medicine

## 2017-06-30 ENCOUNTER — Encounter: Payer: Self-pay | Admitting: Neurology

## 2017-06-30 ENCOUNTER — Ambulatory Visit (INDEPENDENT_AMBULATORY_CARE_PROVIDER_SITE_OTHER): Payer: PPO | Admitting: Neurology

## 2017-06-30 VITALS — BP 134/78 | HR 64 | Ht 63.0 in | Wt 146.0 lb

## 2017-06-30 DIAGNOSIS — M8580 Other specified disorders of bone density and structure, unspecified site: Secondary | ICD-10-CM | POA: Diagnosis not present

## 2017-06-30 DIAGNOSIS — M2550 Pain in unspecified joint: Secondary | ICD-10-CM | POA: Insufficient documentation

## 2017-06-30 DIAGNOSIS — R202 Paresthesia of skin: Secondary | ICD-10-CM | POA: Diagnosis not present

## 2017-06-30 DIAGNOSIS — M159 Polyosteoarthritis, unspecified: Secondary | ICD-10-CM

## 2017-06-30 HISTORY — DX: Polyosteoarthritis, unspecified: M15.9

## 2017-06-30 NOTE — Progress Notes (Signed)
PATIENT: [name] [name] DOB: [birth year]  Chief Complaint  Patient presents with  . NP [name] Cower, MD  . Numbness/weakness finger    Middle 2 left fingers and R pinky (for 6-90months).  Took zanaflex for 2 wks, caused headaches/ nightmares so she stopped.      HISTORICAL  [name] [name] is a 71 years old right-handed female, accompanied by her husband, seen in refer by her primary care  [name] [name] for evaluation of finger numbness, initial evaluation was June 30 2017.  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  REVIEW OF SYSTEMS: Full 14 system review of systems performed and notable only for joint pain, swelling, cramps, aching muscle, headache, numbness, weakness, decreased energy  ALLERGIES: No Known Allergies  HOME MEDICATIONS: Current Outpatient Prescriptions  Medication Sig Dispense Refill  . aspirin 81 MG tablet Take 81 mg by mouth 3 (three) times a week. Monday, Wednesday, and Fridays    . atorvastatin (LIPITOR) 40 MG tablet TAKE ONE-HALF TABLET BY MOUTH ONCE DAILY 45 tablet 1  . butalbital-acetaminophen-caffeine (FIORICET, ESGIC) 50-325-40 MG tablet Take 1 tablet by mouth every 4 (four) hours as needed for headache. 30 tablet 0  . calcium carbonate (OS-CAL) 600 MG tablet Take 1 tablet (600 mg total) by mouth daily. 60 tablet   . Cholecalciferol (VITAMIN D3) 1000 units CAPS Take 1,000 Units by mouth daily.    . cloNIDine  (CATAPRES) 0.1 MG tablet 0.1 mg tab by mouth as needed for blood pressure over 160/90; may repeat in 1 hour if needed 30 tablet 3  . losartan (COZAAR) 25 MG tablet TAKE ONE TABLET BY MOUTH ONCE DAILY 90 tablet 2  . metoprolol succinate (TOPROL-XL) 50 MG 24 hr tablet Take 1 tablet (50 mg total) by mouth daily. --- Office visit needed for further refills 90 tablet 0  . pantoprazole (PROTONIX) 40 MG tablet Take 1 tablet (40 mg total) by mouth 2 (two) times daily before a meal. 180 tablet 3  . tiZANidine (ZANAFLEX) 4 MG tablet 1 by  Mouth at bedtime as needed (Patient not taking: Reported on 06/30/2017) 30 tablet 2   No current facility-administered medications for this visit.     PAST MEDICAL HISTORY: Past Medical History:  Diagnosis Date  . Atypical chest pain   . Gastritis 11/08/2007, 04/12/2012   H pylori bx neg 03/2012  . GERD (gastroesophageal reflux disease) 11/08/2007, 04/12/2012  . Hiatal hernia 11/08/2007, 04/12/2012  . History of kidney stones   . Hyperlipidemia   . Hypertension   . IBS (irritable bowel syndrome)    diarrhea predom  . Migraine   . Osteoporosis 09/27/2010   DEXA 01/21/2012: -2.1 spine and R fem, -1.8 L fem s/p fosamax  2004-2011 DEXA 04/18/14 @ [facility]: -2.5 - rec to start Prolia    . PVC's (premature ventricular contractions)   . 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 Shingles outbreak 04/2013    PAST SURGICAL HISTORY: Past Surgical History:  Procedure Laterality Date  . APPENDECTOMY  1958  . CYSTOCELE REPAIR  2008  . [place]  . TONSILLECTOMY  1960    FAMILY HISTORY: Family History  Problem Relation Age of Onset  . Hypertension Mother   . Alzheimer's disease Mother 73  . AAA (abdominal aortic aneurysm) Mother   . Stroke Father 44  . Bone cancer Brother 59  . Colon cancer Neg Hx     SOCIAL HISTORY:  Social History   Social History  . Marital status: Married    Spouse name: N/A  . Number of children: N/A  .  Years of education: N/A   Occupational History  . Not on file.   Social History Main Topics  . Smoking status: Never Smoker  . Smokeless tobacco: Never Used  . Alcohol use No  . Drug use: No  . Sexual activity: Not on file   Other Topics Concern  . Not on file   Social History Narrative   Married, lives with spouse. Retired Licensed conveyancer, Print production planner. Has 3 kids (moved to [place] to be closer)- youngest son MD. Dorie Rank to [place] from [place] 06/2010, lived in [place] x 10years     PHYSICAL EXAM   Vitals:   06/30/17 0802  BP: 134/78  Pulse: 64  Weight: 146 lb (66.2 kg)  Height: 5\' 3"  (1.6 m)    Not recorded      Body mass index is 25.86 kg/m.  PHYSICAL EXAMNIATION:  Gen: NAD, conversant, well nourised, obese, well groomed                     Cardiovascular: Regular rate rhythm, no peripheral edema, warm, nontender. Eyes: Conjunctivae clear without exudates or hemorrhage Neck: Supple, no carotid bruits. Pulmonary: Clear to auscultation bilaterally  Musculoskeletal: Mild deformity and tenderness of bilateral hands joint, wrist  NEUROLOGICAL EXAM:  MENTAL STATUS: Speech:    Speech is normal; fluent and spontaneous with normal comprehension.  Cognition:     Orientation to time, place and person     Normal recent and remote memory     Normal Attention span and concentration     Normal Language, naming, repeating,spontaneous speech     Fund of knowledge   CRANIAL NERVES: CN II: Visual fields are full to confrontation. Fundoscopic exam is normal with sharp discs and no vascular changes. Pupils are round equal and briskly reactive to light. CN III, IV, VI: extraocular movement are normal. No ptosis. CN V: Facial sensation is intact to pinprick in all 3 divisions bilaterally. Corneal responses are intact.  CN VII: Face is symmetric with normal eye closure and smile. CN VIII: Hearing is normal to rubbing fingers CN IX, X: Palate elevates symmetrically. Phonation is normal. CN  XI: Head turning and shoulder shrug are intact CN XII: Tongue is midline with normal movements and no atrophy.  MOTOR: There is no pronator drift of out-stretched arms. Muscle bulk and tone are normal. Muscle strength is normal.  REFLEXES: Reflexes are 2+ and symmetric at the biceps, triceps, knees, and ankles. Plantar responses are flexor.  SENSORY: Intact to light touch, pinprick, positional sensation and vibratory sensation are intact in fingers and toes. With exception of decreased light touch pinprick at the left third and fourth fingertip, bilateral wrist Tinel sign  COORDINATION: Rapid alternating movements and fine finger movements are intact. There is no dysmetria on finger-to-nose and heel-knee-shin.    GAIT/STANCE: Mildly antalgic, steady Romberg is absent.   DIAGNOSTIC DATA (LABS, IMAGING, TESTING) - I reviewed patient records, labs, notes, testing and imaging myself  where available.   ASSESSMENT AND PLAN  [name] [name] is a 71 y.o. female   Left hand finger paresthesia Multiple hands joints pain  Differentiation diagnosis includes left carpal tunnel syndromes, proceed with EMG nerve conduction study  Laboratory evaluation for inflammatory markers   Marcial Pacas, M.D. Ph.D.  [facility] [address] [place], [place] [address] Ph: [phone] Fax: [phone]  CC: Biagio Borg, MD

## 2017-07-01 LAB — SEDIMENTATION RATE: SED RATE: 4 mm/h (ref 0–40)

## 2017-07-01 LAB — C-REACTIVE PROTEIN: CRP: 1.1 mg/L (ref 0.0–4.9)

## 2017-07-01 LAB — RHEUMATOID FACTOR: Rhuematoid fact SerPl-aCnc: <10 IU/mL (ref 0.0–13.9)

## 2017-07-01 LAB — VITAMIN D 25 HYDROXY (VIT D DEFICIENCY, FRACTURES): Vit D, 25-Hydroxy: 48.4 ng/mL (ref 30.0–100.0)

## 2017-07-01 LAB — VITAMIN B12: Vitamin B-12: 1952 pg/mL — ABNORMAL HIGH (ref 232–1245)

## 2017-07-01 LAB — ANA W/REFLEX: ANA: NEGATIVE

## 2017-07-02 ENCOUNTER — Telehealth: Payer: Self-pay | Admitting: *Deleted

## 2017-07-02 NOTE — Telephone Encounter (Signed)
Called and spoke with patient about normal labs per YY,MD. Patient verbalized understanding.

## 2017-07-02 NOTE — Telephone Encounter (Signed)
-----   Message from Marcial Pacas, MD sent at 07/01/2017  7:57 AM EDT ----- Please call patient for normal laboratory result

## 2017-07-16 DIAGNOSIS — H25813 Combined forms of age-related cataract, bilateral: Secondary | ICD-10-CM | POA: Diagnosis not present

## 2017-07-16 DIAGNOSIS — H11043 Peripheral pterygium, stationary, bilateral: Secondary | ICD-10-CM | POA: Diagnosis not present

## 2017-07-16 DIAGNOSIS — H00024 Hordeolum internum left upper eyelid: Secondary | ICD-10-CM | POA: Diagnosis not present

## 2017-07-17 ENCOUNTER — Encounter (INDEPENDENT_AMBULATORY_CARE_PROVIDER_SITE_OTHER): Payer: Self-pay | Admitting: Neurology

## 2017-07-17 ENCOUNTER — Ambulatory Visit (INDEPENDENT_AMBULATORY_CARE_PROVIDER_SITE_OTHER): Payer: PPO | Admitting: Neurology

## 2017-07-17 ENCOUNTER — Encounter: Payer: Self-pay | Admitting: Internal Medicine

## 2017-07-17 DIAGNOSIS — G5600 Carpal tunnel syndrome, unspecified upper limb: Secondary | ICD-10-CM | POA: Insufficient documentation

## 2017-07-17 DIAGNOSIS — M8580 Other specified disorders of bone density and structure, unspecified site: Secondary | ICD-10-CM

## 2017-07-17 DIAGNOSIS — Z0289 Encounter for other administrative examinations: Secondary | ICD-10-CM

## 2017-07-17 DIAGNOSIS — R202 Paresthesia of skin: Secondary | ICD-10-CM

## 2017-07-17 DIAGNOSIS — G5603 Carpal tunnel syndrome, bilateral upper limbs: Secondary | ICD-10-CM

## 2017-07-17 DIAGNOSIS — M2550 Pain in unspecified joint: Secondary | ICD-10-CM | POA: Diagnosis not present

## 2017-07-17 HISTORY — DX: Carpal tunnel syndrome, bilateral upper limbs: G56.03

## 2017-07-17 NOTE — Progress Notes (Signed)
PATIENT: [name] [name] DOB: 09-Jun-1946  No chief complaint on file.    HISTORICAL  [name] [name] is a 71 years old right-handed female, accompanied by her husband, seen in refer by her primary care  Cathlean Cower for evaluation of finger numbness, initial evaluation was June 30 2017.  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  UPDATE July 17 2017: Laboratory evaluation in August 2018 showed normal rheumatoid factor, C-reactive protein, ESR, vitamin D, vitamin B12, negative ANA, thyroid, CBC, BMP, with exception of mild elevated glucose 117, liver functional tests,  Today's electrodiagnostic studies confirmed moderate bilateral carpal tunnel syndromes, there is no evidence of left cervical radiculopathy  She also noticed right fifth finger catches sometime.  REVIEW OF SYSTEMS: Full 14 system review of systems performed and notable only for joint pain, swelling, cramps, aching muscle, headache, numbness, weakness, decreased energy  ALLERGIES: No Known Allergies  HOME MEDICATIONS: Current Outpatient Prescriptions  Medication Sig Dispense Refill  . aspirin 81 MG tablet Take 81 mg by mouth 3 (three) times a week. Monday, Wednesday, and Fridays    . atorvastatin (LIPITOR) 40 MG tablet TAKE ONE-HALF TABLET BY MOUTH ONCE DAILY 45 tablet 1  . butalbital-acetaminophen-caffeine (FIORICET, ESGIC) 50-325-40 MG tablet Take 1 tablet by mouth every  4 (four) hours as needed for headache. 30 tablet 0  . calcium carbonate (OS-CAL) 600 MG tablet Take 1 tablet (600 mg total) by mouth daily. 60 tablet   . Cholecalciferol (VITAMIN D3) 1000 units CAPS Take 1,000 Units by mouth daily.    . cloNIDine (CATAPRES) 0.1 MG tablet 0.1 mg tab by mouth as needed for blood pressure over 160/90; may repeat in 1 hour if needed 30 tablet 3  . losartan (COZAAR) 25 MG tablet TAKE ONE TABLET BY MOUTH ONCE DAILY 90 tablet 2  . metoprolol succinate (TOPROL-XL) 50 MG 24 hr tablet Take 1 tablet (50 mg total) by mouth daily. --- Office visit needed for further refills 90 tablet 0  . pantoprazole (PROTONIX) 40 MG tablet Take 1 tablet (40 mg total) by mouth 2 (two) times daily before a meal. 180 tablet 3  . tiZANidine (ZANAFLEX) 4 MG tablet 1 by  Mouth at bedtime as needed (Patient not taking: Reported on 06/30/2017) 30 tablet 2   No current facility-administered medications for this visit.     PAST MEDICAL HISTORY: Past Medical History:  Diagnosis Date  . Atypical chest pain   . Gastritis 11/08/2007, 04/12/2012   H pylori bx neg 03/2012  . GERD (gastroesophageal reflux disease) 11/08/2007, 04/12/2012  . Hiatal hernia 11/08/2007, 04/12/2012  . History of kidney stones   . Hyperlipidemia   . Hypertension   . IBS (irritable bowel syndrome)    diarrhea predom  . Migraine   . Osteoporosis 09/27/2010   DEXA 01/21/2012: -2.1 spine and R fem, -1.8 L fem s/p fosamax  2004-2011 DEXA 04/18/14 @ [facility]: -2.5 - rec to start Prolia    . PVC's (premature ventricular contractions)   . 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  Shingles outbreak 04/2013    PAST SURGICAL HISTORY: Past Surgical History:  Procedure Laterality Date  . APPENDECTOMY  1958  . CYSTOCELE REPAIR  2008  . Maxilofacial  1992  . TONSILLECTOMY  1960    FAMILY HISTORY: Family History  Problem Relation Age of Onset  . Hypertension Mother   . Alzheimer's disease Mother 78  . AAA  (abdominal aortic aneurysm) Mother   . Stroke Father 57  . Bone cancer Brother 59  . Colon cancer Neg Hx     SOCIAL HISTORY:  Social History   Social History  . Marital status: Married    Spouse name: N/A  . Number of children: N/A  . Years of education: N/A   Occupational History  . Not on file.   Social History Main Topics  . Smoking status: Never Smoker  . Smokeless tobacco: Never Used  . Alcohol use No  . Drug use: No  . Sexual activity: Not on file   Other Topics Concern  . Not on file   Social History Narrative   Married, lives with spouse. Retired Licensed conveyancer, Print production planner. Has 3 kids (moved to [place] to be closer)- youngest son MD. Dorie Rank to [place] from [place] 06/2010, lived in [place] x 10years     PHYSICAL EXAM   There were no vitals filed for this visit.  Not recorded      There is no height or weight on file to calculate BMI.  PHYSICAL EXAMNIATION:  Gen: NAD, conversant, well nourised, obese, well groomed                     Cardiovascular: Regular rate rhythm, no peripheral edema, warm, nontender. Eyes: Conjunctivae clear without exudates or hemorrhage Neck: Supple, no carotid bruits. Pulmonary: Clear to auscultation bilaterally  Musculoskeletal: Mild deformity and tenderness of bilateral hands joint, wrist  NEUROLOGICAL EXAM:  MENTAL STATUS: Speech:    Speech is normal; fluent and spontaneous with normal comprehension.  Cognition:     Orientation to time, place and person     Normal recent and remote memory     Normal Attention span and concentration     Normal Language, naming, repeating,spontaneous speech     Fund of knowledge   CRANIAL NERVES: CN II: Visual fields are full to confrontation. Fundoscopic exam is normal with sharp discs and no vascular changes. Pupils are round equal and briskly reactive to light. CN III, IV, VI: extraocular movement are normal. No ptosis. CN V: Facial sensation is intact to pinprick in all 3 divisions  bilaterally. Corneal responses are intact.  CN VII: Face is symmetric with normal eye closure and smile. CN VIII: Hearing is normal to rubbing fingers CN IX, X: Palate elevates symmetrically. Phonation is normal. CN XI: Head turning and shoulder shrug are intact CN XII: Tongue is midline with normal movements and no atrophy.  MOTOR: There is no pronator drift of out-stretched arms. Muscle bulk and tone are normal. Muscle strength is normal.  REFLEXES: Reflexes are 2+ and symmetric at the biceps, triceps, knees, and ankles. Plantar responses are flexor.  SENSORY: Intact to light touch, pinprick, positional sensation and vibratory sensation are intact in fingers and toes. With exception of decreased light touch pinprick at the left third and fourth fingertip, bilateral wrist Tinel sign  COORDINATION: Rapid alternating movements and fine finger movements are intact. There is no dysmetria on finger-to-nose and heel-knee-shin.    GAIT/STANCE: Mildly antalgic, steady Romberg is absent.   DIAGNOSTIC DATA (  LABS, IMAGING, TESTING) - I reviewed patient records, labs, notes, testing and imaging myself where available.   ASSESSMENT AND PLAN  [name] [name] is a 71 y.o. female   Left hand finger paresthesia Multiple hands joints pain  Consistent with moderate bilateral carpal tunnel syndromes, left worse than right  I have suggested her as needed NSAIDs, wrist splint  Hand surgeon refer, also right 5th finger trigger finger    Marcial Pacas, M.D. Ph.D.  [facility] [address] [place], [place] [address] Ph: [phone] Fax: [phone]  CC: Biagio Borg, MD

## 2017-07-17 NOTE — Procedures (Signed)
Full Name: [name] [name] Gender: Female MRN #: [id number] Date of Birth: [birth year]    Visit Date: 07/17/2017 09:12 Age: 71 Years 1 Months Old Examining Physician: Marcial Pacas, MD  Referring Physician: [name], MD History: 71 years old right-handed female, presenting with multiple hand joints pain, right fifth finger trigger finger, left hand paresthesia  Summary of the test: Nerve conduction study: Bilateral median mixed response showed moderately prolonged peak latency was mildly decreased snap amplitude. Bilateral ulnar sensory responses were normal.  Bilateral median motor responses showed mildly prolonged distal latency, left worse than right. There is also evidence of left median ulnar Myrtis Hopping anastomosis. Bilateral ulnar motor responses were normal.  Electromyography: Selective needle examinations was performed at left upper extremity muscles and left cervical paraspinal muscles, there was no significant abnormality noted, with exception of mildly neuropathic changes at the left abductor pollicis brevis.     Conclusion:  This is an abnormal study, there is electrodiagnostic evidence of bilateral median neuropathy across the wrist, consistent with bilateral carpal tunnel syndromes. There is no evidence of left cervical radiculopathy. There is evidence of left median ulnar Martin-Gruber anastomosis which is an anatomic variation.   ------------------------------- Marcial Pacas, M.D.  [facility] [place], [place] [address] Tel: [phone] Fax: [phone]        [facility]    Nerve / Sites Muscle Latency Ref. Amplitude Ref. Rel Amp Segments Distance Velocity Ref. Area    ms ms mV mV %  cm m/s m/s mVms  L Median - APB     Wrist APB 5.9 ?4.4 4.4 ?4.0 100 Wrist - APB 7   17.4     Upper arm APB 8.3  6.6  150 Upper arm - Wrist 18 77 ?49 24.2  R Median - APB     Wrist APB 4.5 ?4.4 3.4 ?4.0 100 Wrist - APB 7   13.2     Upper arm APB 8.3  2.7   81 Upper arm - Wrist 19 50 ?49 12.5  L Ulnar - ADM     Wrist ADM 2.6 ?3.3 7.7 ?6.0 100 Wrist - ADM 7   22.3     B.Elbow ADM 5.7  6.6  85.9 B.Elbow - Wrist 18 58 ?49 21.1     A.Elbow ADM 7.8  6.3  94.9 A.Elbow - B.Elbow 12 58 ?49 20.2         A.Elbow - Wrist      R Ulnar - ADM     Wrist ADM 2.7 ?3.3 8.0 ?6.0 100 Wrist - ADM 7   18.9     B.Elbow ADM 5.8  7.3  90.5 B.Elbow - Wrist 18 57 ?49 17.9     A.Elbow ADM 8.0  7.0  96.1 A.Elbow - B.Elbow 12 56 ?49 18.5         A.Elbow - Wrist      L Median, Ulnar - Martin-Gruber Anastomosis     Ulnar Wrist ADM 2.8  3.1  100     6.7     Ulnar Elbow ADM 6.3  2.6  82.3     4.8     Median Wrist ADM 5.8  6.1  239     22.8     Median Elbow ADM 8.1  6.7  109     23.8               SNC    Nerve / Sites Rec. Site Peak Lat  Ref.  Amp Ref. Segments Distance    ms ms V V  cm  L Median - Orthodromic (Dig II, Mid palm)     Dig II Wrist 4.1 ?3.4 5 ?10 Dig II - Wrist 13  R Median - Orthodromic (Dig II, Mid palm)     Dig II Wrist 4.2 ?3.4 4 ?10 Dig II - Wrist 13  L Ulnar - Orthodromic, (Dig V, Mid palm)     Dig V Wrist 2.6 ?3.1 4 ?5 Dig V - Wrist 11  R Ulnar - Orthodromic, (Dig V, Mid palm)     Dig V Wrist 2.6 ?3.1 5 ?5 Dig V - Wrist 87             F  Wave    Nerve F Lat Ref.   ms ms  L Ulnar - ADM 26.9 ?32.0  R Ulnar - ADM 27.1 ?32.0         EMG full       EMG Summary Table    Spontaneous MUAP Recruitment  Muscle IA Fib PSW Fasc Other Amp Dur. Poly Pattern  R. First dorsal interosseous Normal None None None _______ Normal Normal Normal Normal  R. Abductor pollicis brevis Normal None None None _______ Normal Normal Normal Reduced  R. Pronator teres Normal None None None _______ Normal Normal Normal Normal  R. Deltoid Normal None None None _______ Normal Normal Normal Normal  R. Biceps brachii Normal None None None _______ Normal Normal Normal Normal  R. Cervical paraspinals Normal None None None _______ Normal Normal Normal Normal

## 2017-07-17 NOTE — Patient Instructions (Signed)
[facility] [address] [place],  [address]  Inquiries or Appointments: [phone]  [place]: [phone]

## 2017-07-19 NOTE — Progress Notes (Signed)
Subjective:    Patient ID: [name] [name], female    DOB: [birth year], 71 y.o.   MRN: [id number]  HPI She is here for an acute visit.   She was seen by Dr Jenny Reichmann and Dr [name] for b/l hand symptoms.  She has had b/l hand joint swelling and tenderness, numbness in left 4-5th finger pads and associated weakness.  She has had right hand numbness and weakness.    She has multiple joint pains:  Knees, shoulder, elbow, ankle, neck pain.  She had an EMG and she does have b/l carpal tunnel syndrome.  CRP, ESR, vitamin D, vitami B12 were all normal.  Her RF and ANA were both negative.   Right hand finger pain:  Her right 5th finger gets stuck at times and she has to pry it open but it flexes again on his own.  She has a small lump in the balm or hand that is tender.  She denies any numbness or tingling in her right hand.  Left 3-4th fingers are numb in the finger tips only.  She denies any numbness elsewhere in the hand. Her hand does seem weaker, but this is not new.  She has had the numbness for a while. It is worse when she lays on the left side at night.          Medications and allergies reviewed with patient and updated if appropriate.  Patient Active Problem List   Diagnosis Date Noted  . CTS (carpal tunnel syndrome) 07/17/2017  . Bilateral carpal tunnel syndrome 07/17/2017  . Arthralgia 06/30/2017  . Paresthesia 06/10/2017  . Cramping of hands 06/10/2017  . Leg cramps 06/10/2017  . 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 Chronic midline low back pain without sciatica 09/15/2016  . Osteopenia 07/20/2016  . Bilateral finger numbness 06/03/2016  . Joint pain 06/03/2016  . Contusion of right leg 04/03/2016  . Left upper arm pain 12/20/2015  . Atypical chest pain 02/09/2015  . Hyperlipidemia 02/09/2015  . Primary localized osteoarthrosis, lower leg 02/22/2014  . Osteoarthritis of right knee 10/03/2013  . Lateral meniscal tear 10/03/2013  . Patellofemoral pain syndrome  10/03/2013  . Varicose vein of leg   . Knee pain, bilateral 05/03/2013  . Allergic rhinitis   . IBS (irritable bowel syndrome) 04/07/2011  . Migraine headache 09/27/2010  . Essential hypertension 09/27/2010  . GERD 09/27/2010  . OSTEOARTHRITIS 09/27/2010  . HEADACHE 09/27/2010  . RENAL CALCULUS, HX OF 09/27/2010    Current Outpatient Prescriptions on File Prior to Visit  Medication Sig Dispense Refill  . aspirin 81 MG tablet Take 81 mg by mouth 3 (three) times a week. Monday, Wednesday, and Fridays    . atorvastatin (LIPITOR) 40 MG tablet TAKE ONE-HALF TABLET BY MOUTH ONCE DAILY 45 tablet 1  . butalbital-acetaminophen-caffeine (FIORICET, ESGIC) 50-325-40 MG tablet Take 1 tablet by mouth every 4 (four) hours as needed for headache. 30 tablet 0  . calcium carbonate (OS-CAL) 600 MG tablet Take 1 tablet (600 mg total) by mouth daily. 60 tablet   . Cholecalciferol (VITAMIN D3) 1000 units CAPS Take 1,000 Units by mouth daily.    . cloNIDine (CATAPRES) 0.1 MG tablet 0.1 mg tab by mouth as needed for blood pressure over 160/90; may repeat in 1 hour if needed 30 tablet 3  . losartan (COZAAR) 25 MG tablet TAKE ONE TABLET BY MOUTH ONCE DAILY 90 tablet 2  . metoprolol succinate (TOPROL-XL) 50 MG 24 hr tablet Take 1  tablet (50 mg total) by mouth daily. --- Office visit needed for further refills 90 tablet 0  . pantoprazole (PROTONIX) 40 MG tablet Take 1 tablet (40 mg total) by mouth 2 (two) times daily before a meal. 180 tablet 3  . tiZANidine (ZANAFLEX) 4 MG tablet 1 by  Mouth at bedtime as needed 30 tablet 2   No current facility-administered medications on file prior to visit.     Past Medical History:  Diagnosis Date  . Atypical chest pain   . Gastritis 11/08/2007, 04/12/2012   H pylori bx neg 03/2012  . GERD (gastroesophageal reflux disease) 11/08/2007, 04/12/2012  . Hiatal hernia 11/08/2007, 04/12/2012  . History of kidney stones   . Hyperlipidemia   . Hypertension   . IBS (irritable  bowel syndrome)    diarrhea predom  . Migraine   . Osteoporosis 09/27/2010   DEXA 01/21/2012: -2.1 spine and R fem, -1.8 L fem s/p fosamax  2004-2011 DEXA 04/18/14 @ [facility]: -2.5 - rec to start Prolia    . PVC's (premature ventricular contractions)   . 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 Shingles outbreak 04/2013    Past Surgical History:  Procedure Laterality Date  . APPENDECTOMY  1958  . CYSTOCELE REPAIR  2008  . Maxilofacial  1992  . TONSILLECTOMY  1960    Social History   Social History  . Marital status: Married    Spouse name: N/A  . Number of children: N/A  . Years of education: N/A   Social History Main Topics  . Smoking status: Never Smoker  . Smokeless tobacco: Never Used  . Alcohol use No  . Drug use: No  . Sexual activity: Not on file   Other Topics Concern  . Not on file   Social History Narrative   Married, lives with spouse. Retired Licensed conveyancer, Print production planner. Has 3 kids (moved to [place] to be closer)- youngest son MD. Dorie Rank to  from [place] 06/2010, lived in [place] x 10years    Family History  Problem Relation Age of Onset  . Hypertension Mother   . Alzheimer's disease Mother 60  . AAA (abdominal aortic aneurysm) Mother   . Stroke Father 54  . Bone cancer Brother 58  . Colon cancer Neg Hx     Review of Systems  Constitutional: Negative for chills and fever.  Musculoskeletal: Positive for arthralgias.  Skin: Negative for color change and rash.  Neurological: Positive for weakness (from arthritis) and numbness (left 3-4th finger tips).       Objective:   Vitals:   07/20/17 1620  BP: (!) 146/88  Pulse: 74  Resp: 16  Temp: 98.7 F (37.1 C)  SpO2: 98%   Filed Weights   07/20/17 1620  Weight: 146 lb (66.2 kg)   Body mass index is 25.86 kg/m.  Wt Readings from Last 3 Encounters:  07/20/17 146 lb (66.2 kg)  06/30/17 146 lb (66.2 kg)  06/10/17 147 lb (66.7 kg)     Physical Exam  Constitutional: She  appears well-developed. No distress.  Cardiovascular:  Normal radial pulses b/l  Musculoskeletal:  Right fifth finger without deformity, palpable dupuytren's contracture, normal strength; left 3-4th finger tip decreased sensation, normal strength in left hand  Neurological: She exhibits normal muscle tone.  Skin: Skin is warm and dry. No rash noted. She is not diaphoretic. No erythema.          Assessment & Plan:   See Problem List for Assessment and  Plan of chronic medical problems.

## 2017-07-20 ENCOUNTER — Ambulatory Visit (INDEPENDENT_AMBULATORY_CARE_PROVIDER_SITE_OTHER): Payer: PPO | Admitting: Internal Medicine

## 2017-07-20 ENCOUNTER — Encounter: Payer: Self-pay | Admitting: Internal Medicine

## 2017-07-20 VITALS — BP 146/88 | HR 74 | Temp 98.7°F | Resp 16 | Wt 146.0 lb

## 2017-07-20 DIAGNOSIS — M720 Palmar fascial fibromatosis [Dupuytren]: Secondary | ICD-10-CM

## 2017-07-20 DIAGNOSIS — R202 Paresthesia of skin: Secondary | ICD-10-CM | POA: Diagnosis not present

## 2017-07-20 DIAGNOSIS — G5603 Carpal tunnel syndrome, bilateral upper limbs: Secondary | ICD-10-CM

## 2017-07-20 DIAGNOSIS — R200 Anesthesia of skin: Secondary | ICD-10-CM | POA: Insufficient documentation

## 2017-07-20 HISTORY — DX: Palmar fascial fibromatosis (dupuytren): M72.0

## 2017-07-20 NOTE — Assessment & Plan Note (Signed)
New diagnosis  -b/l Symptoms are not consistent with CTS Has gotten wrist braces and will wear at night Will discuss further with hand ortho

## 2017-07-20 NOTE — Assessment & Plan Note (Signed)
Right 5th finger Already referred to hand ortho by neuro Will discuss injection with ortho

## 2017-07-20 NOTE — Patient Instructions (Signed)
See the hand specialist.

## 2017-07-20 NOTE — Assessment & Plan Note (Signed)
Left 3-4 th finger tips only Has been referred to hand ortho

## 2017-07-21 ENCOUNTER — Encounter: Payer: Self-pay | Admitting: Internal Medicine

## 2017-09-03 ENCOUNTER — Other Ambulatory Visit: Payer: Self-pay | Admitting: Internal Medicine

## 2017-09-07 DIAGNOSIS — H04123 Dry eye syndrome of bilateral lacrimal glands: Secondary | ICD-10-CM | POA: Diagnosis not present

## 2017-09-07 DIAGNOSIS — H01001 Unspecified blepharitis right upper eyelid: Secondary | ICD-10-CM | POA: Diagnosis not present

## 2017-09-07 DIAGNOSIS — H11043 Peripheral pterygium, stationary, bilateral: Secondary | ICD-10-CM | POA: Diagnosis not present

## 2017-09-07 DIAGNOSIS — H01004 Unspecified blepharitis left upper eyelid: Secondary | ICD-10-CM | POA: Diagnosis not present

## 2017-09-07 DIAGNOSIS — H25813 Combined forms of age-related cataract, bilateral: Secondary | ICD-10-CM | POA: Diagnosis not present

## 2017-09-09 ENCOUNTER — Other Ambulatory Visit: Payer: Self-pay | Admitting: Cardiovascular Disease

## 2017-09-11 ENCOUNTER — Ambulatory Visit (INDEPENDENT_AMBULATORY_CARE_PROVIDER_SITE_OTHER): Payer: PPO | Admitting: Family Medicine

## 2017-09-11 ENCOUNTER — Ambulatory Visit: Payer: Self-pay

## 2017-09-11 ENCOUNTER — Encounter: Payer: Self-pay | Admitting: Family Medicine

## 2017-09-11 VITALS — BP 106/72 | HR 51 | Ht 63.0 in | Wt 148.0 lb

## 2017-09-11 DIAGNOSIS — G5603 Carpal tunnel syndrome, bilateral upper limbs: Secondary | ICD-10-CM

## 2017-09-11 DIAGNOSIS — M79646 Pain in unspecified finger(s): Secondary | ICD-10-CM | POA: Diagnosis not present

## 2017-09-11 DIAGNOSIS — M65351 Trigger finger, right little finger: Secondary | ICD-10-CM

## 2017-09-11 HISTORY — DX: Trigger finger, right little finger: M65.351

## 2017-09-11 NOTE — Assessment & Plan Note (Signed)
Patient given a trigger finger injection. Tolerated the procedure well. Discussed bracing at night. Discussed monitoring the symptoms of patient's bilateral carpal tunnel. Weakness though seems to be in the right ulnar neuropathy. EMG did not show this. We will monitor closely. Follow-up again in 2-3 weeks.

## 2017-09-11 NOTE — Progress Notes (Signed)
[name] [address] [place], [place] [address] Phone: [phone] Subjective:    I'm seeing this patient by the request  of:    CC: Finger pain  [id number]  [name] [name] is a 71 y.o. female coming in with complaint of finger pain. Seems to be right-sided. Gets stuck at times. Has to open with the contralateral hand. States that seems to be worsening over the course of time. Unfortunately also having numbness in the left third and fourth fingers. Seems to come and go. Seems to be worse at night.    patient did have workup including an EMG showing bilateral carpal tunnel syndrome. No evidence of cervical radiculopathy. This was independently visualized by me.  Past Medical History:  Diagnosis Date  . Atypical chest pain   . Gastritis 11/08/2007, 04/12/2012   H pylori bx neg 03/2012  . GERD (gastroesophageal reflux disease) 11/08/2007, 04/12/2012  . Hiatal hernia 11/08/2007, 04/12/2012  . History of kidney stones   . Hyperlipidemia   . Hypertension   . IBS (irritable bowel syndrome)    diarrhea predom  . Migraine   . Osteoporosis 09/27/2010   DEXA 01/21/2012: -2.1 spine and R fem, -1.8 L fem s/p fosamax  2004-2011 DEXA 04/18/14 @ [facility]: -2.5 - rec to start Prolia    . PVC's (premature ventricular contractions)   . 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 Shingles outbreak 04/2013   Past Surgical History:  Procedure Laterality Date  . APPENDECTOMY  1958  . CYSTOCELE REPAIR  2008  . Maxilofacial  1992  . TONSILLECTOMY  1960   Social History   Social History  . Marital status: Married    Spouse name: N/A  . Number of children: N/A  . Years of education: N/A   Social History Main Topics  . Smoking status: Never Smoker  . Smokeless tobacco: Never Used  . Alcohol use No  . Drug use: No  . Sexual activity: Not Asked   Other Topics Concern  . None   Social History Narrative   Married, lives with spouse.  Retired Licensed conveyancer, Print production planner. Has 3 kids (moved to [place] to be closer)- youngest son MD. Dorie Rank to [place] from [place] 06/2010, lived in [place] x 10years   No Known Allergies Family History  Problem Relation Age of Onset  . Hypertension Mother   . Alzheimer's disease Mother 51  . AAA (abdominal aortic aneurysm) Mother   . Stroke Father 30  . Bone cancer Brother 70  . Colon cancer Neg Hx      Past medical history, social, surgical and family history all reviewed in electronic medical record.  No pertanent information unless stated regarding to the chief complaint.   Review of Systems:Review of systems updated and as accurate as of 09/11/17  No headache, visual changes, nausea, vomiting, diarrhea, constipation, dizziness, abdominal pain, skin rash, fevers, chills, night sweats, weight loss, swollen lymph nodes, body aches, joint swelling,  chest pain, shortness of breath, mood changes. Positive muscle aches  Objective  Blood pressure 106/72, pulse (!) 51, height 5\' 3"  (1.6 m), weight 148 lb (67.1 kg), SpO2 97 %. Systems examined below as of 09/11/17   General: No apparent distress alert and oriented x3 mood and affect normal, dressed appropriately.  HEENT: Pupils equal, extraocular movements intact  Respiratory: Patient's speak in full sentences and does not appear short of breath  Cardiovascular: No lower extremity edema, non tender, no erythema  Skin:  Warm dry intact with no signs of infection or rash on extremities or on axial skeleton.  Abdomen: Soft nontender  Neuro: Cranial nerves II through XII are intact, neurovascularly intact in all extremities with 2+ DTRs and 2+ pulses.  Lymph: No lymphadenopathy of posterior or anterior cervical chain or axillae bilaterally.  Gait normal with good balance and coordination.  MSK:  Non tender with full range of motion and good stability and symmetric strength and tone of shoulders, elbows, wrist, hip, knee and ankles bilaterally.  Hand exam  shows the patient's right hand shows significant weakness of the fifth finger but it seems to be symmetric with extension on the contralateral side. Patient does have a trigger nodule noted on the palmar aspect at the A2 pulley on the fifth finger. Severely tender to palpation. Patient has 4 out of 5 grip strength with the same as the contralateral side. Positive Tinel's bilaterally  Procedure: Real-time Ultrasound Guided Injection of right fifth trigger finger Device: GE Logiq Q7 Ultrasound guided injection is preferred based studies that show increased duration, increased effect, greater accuracy, decreased procedural pain, increased response rate, and decreased cost with ultrasound guided versus blind injection.  Verbal informed consent obtained.  Time-out conducted.  Noted no overlying erythema, induration, or other signs of local infection.  Skin prepped in a sterile fashion.  Local anesthesia: Topical Ethyl chloride.  With sterile technique and under real time ultrasound guidance:  With a 25-gauge half-inch needle injected with 0.5 mL of 0.5% Marcaine and 0.5 mL of Kenalog 40 mg/dL into the flexor tendon sheath at the nodule noted on ultrasound Completed without difficulty  Pain immediately resolved suggesting accurate placement of the medication.  Advised to call if fevers/chills, erythema, induration, drainage, or persistent bleeding.  Images permanently stored and available for review in the ultrasound unit.  Impression: Technically successful ultrasound guided injection.    Impression and Recommendations:     This case required medical decision making of moderate complexity.      Note: This dictation was prepared with Dragon dictation along with smaller phrase technology. Any transcriptional errors that result from this process are unintentional.

## 2017-09-11 NOTE — Assessment & Plan Note (Signed)
Carpal Tunnel Syndrome: We discussed the anatomy involved, and that carpal tunnel syndrome primarily involves the median nerve, and this typically affects digits one through 3. We also discussed that mild cases of carpal tunnel syndrome are often improved with night splints, and it is very reasonable to consider a carpal tunnel injection.also discussed the possibility of surgical intervention. We also discussed his severe carpal tunnel syndrome can lead to permanent nerve impairment even if released. At this point, the patient like to proceed conservatively. Patient though does have weakness in the ulnar distribution as well. This could be secondary to the anastomosis seen on the EMG. We will continue to monitor closely.

## 2017-09-11 NOTE — Patient Instructions (Signed)
Always good to see you  We will watch the hand and I hope the injection will help Exercises 3 times a week.  Conitnue the braces at night See me again in 3 weeks and we will make sure the weakness is not worse pennsaid pinkie amount topically 2 times daily as needed.

## 2017-10-02 ENCOUNTER — Ambulatory Visit (INDEPENDENT_AMBULATORY_CARE_PROVIDER_SITE_OTHER): Payer: PPO

## 2017-10-02 DIAGNOSIS — Z23 Encounter for immunization: Secondary | ICD-10-CM

## 2017-10-05 NOTE — Progress Notes (Signed)
[name] [address] [place], [place] [address] Phone: [phone] Subjective:      CC: Trigger finger follow-up, carpal tunnel follow-up  [id number]  [name] [name] is a 71 y.o. female coming in with complaint of finger pain.  Was found to have a Dupuytren contracture as well as trigger finger.  Patient was given an injection follow-up.  Patient also has EMG showing patient having carpal tunnel syndrome.  Patient states doing much better at this time.  Increase in strength recently.  New numbness but continues to have some of the numbness.     Past Medical History:  Diagnosis Date  . Atypical chest pain   . Gastritis 11/08/2007, 04/12/2012   H pylori bx neg 03/2012  . GERD (gastroesophageal reflux disease) 11/08/2007, 04/12/2012  . Hiatal hernia 11/08/2007, 04/12/2012  . History of kidney stones   . Hyperlipidemia   . Hypertension   . IBS (irritable bowel syndrome)    diarrhea predom  . Migraine   . Osteoporosis 09/27/2010   DEXA 01/21/2012: -2.1 spine and R fem, -1.8 L fem s/p fosamax  2004-2011 DEXA 04/18/14 @ [facility]: -2.5 - rec to start Prolia    . PVC's (premature ventricular contractions)   . 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 Shingles outbreak 04/2013   Past Surgical History:  Procedure Laterality Date  . APPENDECTOMY  1958  . CYSTOCELE REPAIR  2008  . Maxilofacial  1992  . TONSILLECTOMY  1960   Social History   Socioeconomic History  . Marital status: Married    Spouse name: None  . Number of children: None  . Years of education: None  . Highest education level: None  Social Needs  . Financial resource strain: None  . Food insecurity - worry: None  . Food insecurity - inability: None  . Transportation needs - medical: None  . Transportation needs - non-medical: None  Occupational History  . None  Tobacco Use  . Smoking status: Never Smoker  . Smokeless tobacco: Never Used  Substance and  Sexual Activity  . Alcohol use: No  . Drug use: No  . Sexual activity: None  Other Topics Concern  . None  Social History Narrative   Married, lives with spouse. Retired Licensed conveyancer, Print production planner. Has 3 kids (moved to [place] to be closer)- youngest son MD. Dorie Rank to [place] from [place] 06/2010, lived in [place] x 10years   No Known Allergies Family History  Problem Relation Age of Onset  . Hypertension Mother   . Alzheimer's disease Mother 51  . AAA (abdominal aortic aneurysm) Mother   . Stroke Father 10  . Bone cancer Brother 64  . Colon cancer Neg Hx      Past medical history, social, surgical and family history all reviewed in electronic medical record.  No pertanent information unless stated regarding to the chief complaint.   Review of Systems:Review of systems updated and as accurate as of 10/06/17  No headache, visual changes, nausea, vomiting, diarrhea, constipation, dizziness, abdominal pain, skin rash, fevers, chills, night sweats, weight loss, swollen lymph nodes, body aches, joint swelling, chest pain, shortness of breath, mood changes.  Positive muscle aches   Objective  Blood pressure 130/80, pulse (!) 47, height 5\' 3"  (1.6 m), weight 146 lb (66.2 kg), SpO2 96 %. Systems examined below as of 10/06/17   General: No apparent distress alert and oriented x3 mood and affect normal, dressed appropriately.  HEENT: Pupils equal, extraocular  movements intact  Respiratory: Patient's speak in full sentences and does not appear short of breath  Cardiovascular: No lower extremity edema, non tender, no erythema  Skin: Warm dry intact with no signs of infection or rash on extremities or on axial skeleton.  Abdomen: Soft nontender  Neuro: Cranial nerves II through XII are intact, neurovascularly intact in all extremities with 2+ DTRs and 2+ pulses.  Lymph: No lymphadenopathy of posterior or anterior cervical chain or axillae bilaterally.  Gait normal with good balance and coordination.    MSK: Mild tender with full range of motion and good stability and symmetric strength and tone of shoulders, elbows, wrist, hip, knee and ankles bilaterally.  Arthritic changes In exam shows the patient does have improvement in strength.  Patient still has nodule noted over the trigger individual but no triggering of the finger noted.  Good capillary refill. Positive Tinel's      Impression and Recommendations:     This case required medical decision making of moderate complexity.      Note: This dictation was prepared with Dragon dictation along with smaller phrase technology. Any transcriptional errors that result from this process are unintentional.

## 2017-10-06 ENCOUNTER — Encounter: Payer: Self-pay | Admitting: Family Medicine

## 2017-10-06 ENCOUNTER — Ambulatory Visit: Payer: PPO | Admitting: Family Medicine

## 2017-10-06 DIAGNOSIS — M720 Palmar fascial fibromatosis [Dupuytren]: Secondary | ICD-10-CM | POA: Diagnosis not present

## 2017-10-06 DIAGNOSIS — G5603 Carpal tunnel syndrome, bilateral upper limbs: Secondary | ICD-10-CM

## 2017-10-06 MED ORDER — VITAMIN D (ERGOCALCIFEROL) 1.25 MG (50000 UNIT) PO CAPS: 50000.0000 [IU] | ORAL_CAPSULE | ORAL | 0 refills | Status: DC

## 2017-10-06 NOTE — Assessment & Plan Note (Signed)
Improved after the injection.  Likely from the trigger nodule.  We will continue to monitor.  Follow-up again in 4-6 weeks.

## 2017-10-06 NOTE — Patient Instructions (Signed)
Always good to see you  Ice 20 minutes 2 times daily. Usually after activity and before bed. New exercises for the shoulder Watch the numbness and weakness in the hand and call me if you need me.  Once weekly  Vitamin D instead of the daily to help with muscle strength and endurance.  Over the counter get Turmeric 500mg  daily  Tart cherry extract 1200mg  at night  Call me when you need me

## 2017-10-06 NOTE — Assessment & Plan Note (Signed)
Patient states that continuing to have some mild numbness but does not want any other significant changes.  Continue home exercises and icing regimen.  Patient will come back in 40 since worsening symptoms and consider injection.

## 2017-10-22 DIAGNOSIS — H01002 Unspecified blepharitis right lower eyelid: Secondary | ICD-10-CM | POA: Diagnosis not present

## 2017-10-22 DIAGNOSIS — H01005 Unspecified blepharitis left lower eyelid: Secondary | ICD-10-CM | POA: Diagnosis not present

## 2017-10-22 DIAGNOSIS — H11043 Peripheral pterygium, stationary, bilateral: Secondary | ICD-10-CM | POA: Diagnosis not present

## 2017-10-22 DIAGNOSIS — H01001 Unspecified blepharitis right upper eyelid: Secondary | ICD-10-CM | POA: Diagnosis not present

## 2017-10-22 DIAGNOSIS — H25813 Combined forms of age-related cataract, bilateral: Secondary | ICD-10-CM | POA: Diagnosis not present

## 2017-10-22 DIAGNOSIS — H524 Presbyopia: Secondary | ICD-10-CM | POA: Diagnosis not present

## 2017-10-22 DIAGNOSIS — H01004 Unspecified blepharitis left upper eyelid: Secondary | ICD-10-CM | POA: Diagnosis not present

## 2017-11-12 DIAGNOSIS — H01002 Unspecified blepharitis right lower eyelid: Secondary | ICD-10-CM | POA: Diagnosis not present

## 2017-11-12 DIAGNOSIS — H01005 Unspecified blepharitis left lower eyelid: Secondary | ICD-10-CM | POA: Diagnosis not present

## 2017-11-12 DIAGNOSIS — H524 Presbyopia: Secondary | ICD-10-CM | POA: Diagnosis not present

## 2017-11-12 DIAGNOSIS — H01001 Unspecified blepharitis right upper eyelid: Secondary | ICD-10-CM | POA: Diagnosis not present

## 2017-11-12 DIAGNOSIS — H01004 Unspecified blepharitis left upper eyelid: Secondary | ICD-10-CM | POA: Diagnosis not present

## 2017-11-23 DIAGNOSIS — H2512 Age-related nuclear cataract, left eye: Secondary | ICD-10-CM | POA: Diagnosis not present

## 2017-11-30 DIAGNOSIS — H2512 Age-related nuclear cataract, left eye: Secondary | ICD-10-CM | POA: Diagnosis not present

## 2017-11-30 DIAGNOSIS — H25812 Combined forms of age-related cataract, left eye: Secondary | ICD-10-CM | POA: Diagnosis not present

## 2017-12-08 ENCOUNTER — Other Ambulatory Visit: Payer: Self-pay | Admitting: Internal Medicine

## 2017-12-08 DIAGNOSIS — H2511 Age-related nuclear cataract, right eye: Secondary | ICD-10-CM | POA: Diagnosis not present

## 2017-12-11 ENCOUNTER — Ambulatory Visit (INDEPENDENT_AMBULATORY_CARE_PROVIDER_SITE_OTHER): Payer: PPO | Admitting: Internal Medicine

## 2017-12-11 ENCOUNTER — Encounter: Payer: Self-pay | Admitting: Internal Medicine

## 2017-12-11 VITALS — BP 146/84 | HR 70 | Temp 98.3°F | Resp 16 | Wt 149.0 lb

## 2017-12-11 DIAGNOSIS — M25511 Pain in right shoulder: Secondary | ICD-10-CM

## 2017-12-11 DIAGNOSIS — K2900 Acute gastritis without bleeding: Secondary | ICD-10-CM

## 2017-12-11 DIAGNOSIS — K2970 Gastritis, unspecified, without bleeding: Secondary | ICD-10-CM | POA: Insufficient documentation

## 2017-12-11 DIAGNOSIS — M25512 Pain in left shoulder: Secondary | ICD-10-CM

## 2017-12-11 HISTORY — DX: Pain in right shoulder: M25.511

## 2017-12-11 MED ORDER — DEXLANSOPRAZOLE 60 MG PO CPDR: 60.0000 mg | DELAYED_RELEASE_CAPSULE | Freq: Every day | ORAL | 5 refills | Status: DC

## 2017-12-11 MED ORDER — SUCRALFATE 1 GM/10ML PO SUSP
1.0000 g | Freq: Three times a day (TID) | ORAL | 0 refills | Status: DC
Start: 2017-12-11 — End: 2018-01-04

## 2017-12-11 NOTE — Patient Instructions (Addendum)
Stop the pantoprazole.  Start dexilant for the stomach - ideally take it 30 minutes prior to a meal.  Also start taking the liquid medication before meals and at bedtime.  Stop the liquid once your symptoms are better.   Stop the eye supplement.    Call if no improvement   A referral was ordered for orthopedics.      Gastritis, Adult Gastritis is inflammation of the stomach. There are two kinds of gastritis:  Acute gastritis. This kind develops suddenly.  Chronic gastritis. This kind lasts for a long time.  Gastritis happens when the lining of the stomach becomes weak or gets damaged. Without treatment, gastritis can lead to stomach bleeding and ulcers. What are the causes? This condition may be caused by:  An infection.  Drinking too much alcohol.  Certain medicines.  Having too much acid in the stomach.  A disease of the intestines or stomach.  Stress.  What are the signs or symptoms? Symptoms of this condition include:  Pain or a burning in the upper abdomen.  Nausea.  Vomiting.  An uncomfortable feeling of fullness after eating.  In some cases, there are no symptoms. How is this diagnosed? This condition may be diagnosed with:  A description of your symptoms.  A physical exam.  Tests. These can include: ? Blood tests. ? Stool tests. ? A test in which a thin, flexible instrument with a light and camera on the end is passed down the esophagus and into the stomach (upper endoscopy). ? A test in which a sample of tissue is taken for testing (biopsy).  How is this treated? This condition may be treated with medicines. If the condition is caused by a bacterial infection, you may be given antibiotic medicines. If it is caused by too much acid in the stomach, you may get medicines called H2 blockers, proton pump inhibitors, or antacids. Treatment may also involve stopping the use of certain medicines, such as aspirin, ibuprofen, or other nonsteroidal  anti-inflammatory drugs (NSAIDs). Follow these instructions at home:  Take over-the-counter and prescription medicines only as told by your health care provider.  If you were prescribed an antibiotic, take it as told by your health care provider. Do not stop taking the antibiotic even if you start to feel better.  Drink enough fluid to keep your urine clear or pale yellow.  Eat small, frequent meals instead of large meals. Contact a health care provider if:  Your symptoms get worse.  Your symptoms return after treatment. Get help right away if:  You vomit blood or material that looks like coffee grounds.  You have black or dark red stools.  You are unable to keep fluids down.  Your abdominal pain gets worse.  You have a fever.  You do not feel better after 1 week. This information is not intended to replace advice given to you by your health care provider. Make sure you discuss any questions you have with your health care provider. Document Released: 11/04/2001 Document Revised: 07/09/2016 Document Reviewed: 08/04/2015 Elsevier Interactive Patient Education  [employer].

## 2017-12-11 NOTE — Progress Notes (Signed)
Subjective:    Patient ID: [name] [name], female    DOB: 25-Feb-1946, 72 y.o.   MRN: [id number]  HPI She is here for an acute visit.    Abdominal pain, GERD:  For the past couple of weeks, since the holidays, she has had constant GERD - burning in her chest all the time no matter what she is eating.  She has also had epigastric pain.  She thinks she may have gastritis, which she has had in the past.  She is taking protonix daily and a probiotic.  She has taken Tums and pepto and they do not help.  She is now eating a bland diet, but over the holidays she did eat richer foods, etc.  She had cataract surgery on one eye and will have it on the other eye soon.  She has dry eyes and is taking a eye supplement which is the only thing that is new.    Her stools are "normal" - vary in color from light to dark, but she denies black stool or blood in the stool.    She is experiencing bilateral shoulder pain and thinks it is arthritis.  The right is worse than the left.  She is interested in seeing ortho.   Medications and allergies reviewed with patient and updated if appropriate.  Patient Active Problem List   Diagnosis Date Noted  . Trigger finger, right little finger 09/11/2017  . Numbness and tingling in left hand 07/20/2017  . Dupuytren's contracture of hand 07/20/2017  . CTS (carpal tunnel syndrome) 07/17/2017  . Bilateral carpal tunnel syndrome 07/17/2017  . Arthralgia 06/30/2017  . Paresthesia 06/10/2017  . Cramping of hands 06/10/2017  . Leg cramps 06/10/2017  . 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 Chronic midline low back pain without sciatica 09/15/2016  . Osteopenia 07/20/2016  . Bilateral finger numbness 06/03/2016  . Joint pain 06/03/2016  . Contusion of right leg 04/03/2016  . Left upper arm pain 12/20/2015  . Atypical chest pain 02/09/2015  . Hyperlipidemia 02/09/2015  . Primary localized osteoarthrosis, lower leg 02/22/2014  . Osteoarthritis of right  knee 10/03/2013  . Lateral meniscal tear 10/03/2013  . 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 Varicose vein of leg   . Knee pain, bilateral 05/03/2013  . Allergic rhinitis   . IBS (irritable bowel syndrome) 04/07/2011  . Migraine headache 09/27/2010  . Essential hypertension 09/27/2010  . GERD 09/27/2010  . OSTEOARTHRITIS 09/27/2010  . HEADACHE 09/27/2010  . RENAL CALCULUS, HX OF 09/27/2010    Current Outpatient Medications on File Prior to Visit  Medication Sig Dispense Refill  . aspirin 81 MG tablet Take 81 mg by mouth 3 (three) times a week. Monday, Wednesday, and Fridays    . atorvastatin (LIPITOR) 40 MG tablet TAKE ONE-HALF TABLET BY MOUTH ONCE DAILY 45 tablet 1  . butalbital-acetaminophen-caffeine (FIORICET, ESGIC) 50-325-40 MG tablet Take 1 tablet by mouth every 4 (four) hours as needed for headache. 30 tablet 0  . calcium carbonate (OS-CAL) 600 MG tablet Take 1 tablet (600 mg total) by mouth daily. 60 tablet   . Cholecalciferol (VITAMIN D3) 1000 units CAPS Take 1,000 Units by mouth 3 (three) times a week.     . cloNIDine (CATAPRES) 0.1 MG tablet TAKE ONE TABLET BY MOUTH ONCE DAILY AS NEEDED FOR BP OVER  160/90, REPEAT IN ! HOUR IF NEEDED -- Office visit needed for further refills 30 tablet 0  . losartan (COZAAR) 25 MG tablet TAKE  ONE TABLET BY MOUTH ONCE DAILY 90 tablet 2  . metoprolol succinate (TOPROL-XL) 50 MG 24 hr tablet Take 1 tablet (50 mg total) by mouth daily. 90 tablet 3  . pantoprazole (PROTONIX) 40 MG tablet Take 1 tablet (40 mg total) by mouth 2 (two) times daily before a meal. 180 tablet 3   No current facility-administered medications on file prior to visit.     Past Medical History:  Diagnosis Date  . Atypical chest pain   . Gastritis 11/08/2007, 04/12/2012   H pylori bx neg 03/2012  . GERD (gastroesophageal reflux disease) 11/08/2007, 04/12/2012  . Hiatal hernia 11/08/2007, 04/12/2012  . History of kidney stones   . Hyperlipidemia   . Hypertension   .  IBS (irritable bowel syndrome)    diarrhea predom  . Migraine   . Osteoporosis 09/27/2010   DEXA 01/21/2012: -2.1 spine and R fem, -1.8 L fem s/p fosamax  2004-2011 DEXA 04/18/14 @ [facility]: -2.5 - rec to start Prolia    . PVC's (premature ventricular contractions)   . 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 Shingles outbreak 04/2013    Past Surgical History:  Procedure Laterality Date  . APPENDECTOMY  1958  . CYSTOCELE REPAIR  2008  . Maxilofacial  1992  . TONSILLECTOMY  1960    Social History   Socioeconomic History  . Marital status: Married    Spouse name: Not on file  . Number of children: Not on file  . Years of education: Not on file  . Highest education level: Not on file  Social Needs  . Financial resource strain: Not on file  . Food insecurity - worry: Not on file  . Food insecurity - inability: Not on file  . Transportation needs - medical: Not on file  . Transportation needs - non-medical: Not on file  Occupational History  . Not on file  Tobacco Use  . Smoking status: Never Smoker  . Smokeless tobacco: Never Used  Substance and Sexual Activity  . Alcohol use: No  . Drug use: No  . Sexual activity: Not on file  Other Topics Concern  . Not on file  Social History Narrative   Married, lives with spouse. Retired Licensed conveyancer, Print production planner. Has 3 kids (moved to [place] to be closer)- youngest son MD. Dorie Rank to [place] from [place] 06/2010, lived in [place] x 10years    Family History  Problem Relation Age of Onset  . Hypertension Mother   . Alzheimer's disease Mother 33  . AAA (abdominal aortic aneurysm) Mother   . Stroke Father 67  . Bone cancer Brother 66  . Colon cancer Neg Hx     Review of Systems  Constitutional: Negative for chills and fever.  Respiratory: Negative for shortness of breath.   Cardiovascular: Negative for chest pain.  Gastrointestinal: Positive for abdominal pain (epigastric) and nausea. Negative for constipation,  diarrhea and vomiting.       Constant gerd  Musculoskeletal: Positive for arthralgias.  Neurological: Negative for light-headedness and headaches.       Objective:   Vitals:   12/11/17 0747  BP: (!) 146/84  Pulse: 70  Resp: 16  Temp: 98.3 F (36.8 C)  SpO2: 98%   Wt Readings from Last 3 Encounters:  12/11/17 149 lb (67.6 kg)  10/06/17 146 lb (66.2 kg)  09/11/17 148 lb (67.1 kg)   Body mass index is 26.39 kg/m.   Physical Exam    Constitutional: Appears well-developed and well-nourished.  No distress.  HENT:  Head: Normocephalic and atraumatic.  Abdomen: soft, mild tenderness in epigastric region w/o rebound or guarding, no mass, no HSM, non distended Skin: Skin is warm and dry. Not diaphoretic. no rash or skin changes Psychiatric: Normal mood and affect. Behavior is normal.      Assessment & Plan:    See Problem List for Assessment and Plan of chronic medical problems.

## 2017-12-11 NOTE — Assessment & Plan Note (Signed)
Will refer to Ortho - requested Dr Tamera Punt

## 2017-12-11 NOTE — Assessment & Plan Note (Signed)
Uncontrolled GERD and gastritis - she has dealt with both in the past Possibly due to holidays and overindulging, but more likely eye supplement, which she will stop Will change protonix to dexilant - once daily Start carafate 4/day until symptoms resolve Bland diet If symptoms do not improve she will let me know and I will refer to GI Last EGD 2013

## 2017-12-16 ENCOUNTER — Telehealth: Payer: Self-pay | Admitting: Emergency Medicine

## 2017-12-16 NOTE — Telephone Encounter (Signed)
Call her and see how her symptoms are - can try maalox or mylanta over the counter if they do not cause other GI side effects (diarrhea)

## 2017-12-16 NOTE — Telephone Encounter (Signed)
PA completed. Key [id number]. Awaiting response

## 2017-12-16 NOTE — Telephone Encounter (Signed)
PA was denied, please advise on alternative.

## 2017-12-17 NOTE — Telephone Encounter (Signed)
Spoke with pt on 12/16/16 at pt stated she did pick RX up from pharmacy with a $45 copay. She will try this medication before taking anything else.

## 2017-12-21 DIAGNOSIS — H2511 Age-related nuclear cataract, right eye: Secondary | ICD-10-CM | POA: Diagnosis not present

## 2017-12-21 DIAGNOSIS — H25811 Combined forms of age-related cataract, right eye: Secondary | ICD-10-CM | POA: Diagnosis not present

## 2017-12-23 DIAGNOSIS — M67911 Unspecified disorder of synovium and tendon, right shoulder: Secondary | ICD-10-CM | POA: Diagnosis not present

## 2017-12-23 DIAGNOSIS — M24811 Other specific joint derangements of right shoulder, not elsewhere classified: Secondary | ICD-10-CM | POA: Diagnosis not present

## 2017-12-23 DIAGNOSIS — M67912 Unspecified disorder of synovium and tendon, left shoulder: Secondary | ICD-10-CM | POA: Diagnosis not present

## 2018-01-04 ENCOUNTER — Encounter: Payer: Self-pay | Admitting: Internal Medicine

## 2018-01-04 ENCOUNTER — Ambulatory Visit (INDEPENDENT_AMBULATORY_CARE_PROVIDER_SITE_OTHER): Payer: PPO | Admitting: Internal Medicine

## 2018-01-04 ENCOUNTER — Other Ambulatory Visit (INDEPENDENT_AMBULATORY_CARE_PROVIDER_SITE_OTHER): Payer: PPO

## 2018-01-04 VITALS — BP 114/70 | HR 45 | Temp 98.0°F | Resp 16 | Wt 151.0 lb

## 2018-01-04 DIAGNOSIS — K2900 Acute gastritis without bleeding: Secondary | ICD-10-CM | POA: Diagnosis not present

## 2018-01-04 DIAGNOSIS — R1032 Left lower quadrant pain: Secondary | ICD-10-CM

## 2018-01-04 DIAGNOSIS — K219 Gastro-esophageal reflux disease without esophagitis: Secondary | ICD-10-CM | POA: Diagnosis not present

## 2018-01-04 LAB — CBC WITH DIFFERENTIAL/PLATELET
BASOS ABS: 0.1 10*3/uL (ref 0.0–0.1)
Basophils Relative: 0.7 % (ref 0.0–3.0)
EOS ABS: 0.2 10*3/uL (ref 0.0–0.7)
Eosinophils Relative: 2 % (ref 0.0–5.0)
HCT: 41.3 % (ref 36.0–46.0)
Hemoglobin: 13.9 g/dL (ref 12.0–15.0)
LYMPHS ABS: 1.8 10*3/uL (ref 0.7–4.0)
Lymphocytes Relative: 18.9 % (ref 12.0–46.0)
MCHC: 33.7 g/dL (ref 30.0–36.0)
MCV: 98 fl (ref 78.0–100.0)
MONO ABS: 0.8 10*3/uL (ref 0.1–1.0)
Monocytes Relative: 8.4 % (ref 3.0–12.0)
NEUTROS ABS: 6.5 10*3/uL (ref 1.4–7.7)
Neutrophils Relative %: 70 % (ref 43.0–77.0)
PLATELETS: 208 10*3/uL (ref 150.0–400.0)
RBC: 4.21 Mil/uL (ref 3.87–5.11)
RDW: 13.8 % (ref 11.5–15.5)
WBC: 9.3 10*3/uL (ref 4.0–10.5)

## 2018-01-04 LAB — AMYLASE: AMYLASE: 68 U/L (ref 27–131)

## 2018-01-04 LAB — COMPREHENSIVE METABOLIC PANEL
ALT: 19 U/L (ref 0–35)
AST: 19 U/L (ref 0–37)
Albumin: 4.1 g/dL (ref 3.5–5.2)
Alkaline Phosphatase: 47 U/L (ref 39–117)
BILIRUBIN TOTAL: 0.3 mg/dL (ref 0.2–1.2)
BUN: 18 mg/dL (ref 6–23)
CO2: 32 meq/L (ref 19–32)
CREATININE: 0.9 mg/dL (ref 0.40–1.20)
Calcium: 9.7 mg/dL (ref 8.4–10.5)
Chloride: 103 mEq/L (ref 96–112)
GFR: 65.31 mL/min (ref >60.00)
GLUCOSE: 104 mg/dL — AB (ref 70–99)
Potassium: 4.2 mEq/L (ref 3.5–5.1)
Sodium: 141 mEq/L (ref 135–145)
TOTAL PROTEIN: 6.9 g/dL (ref 6.0–8.3)

## 2018-01-04 LAB — LIPASE: LIPASE: 39 U/L (ref 11.0–59.0)

## 2018-01-04 MED ORDER — AMOXICILLIN-POT CLAVULANATE 875-125 MG PO TABS: 1.0000 | ORAL_TABLET | Freq: Two times a day (BID) | ORAL | 0 refills | Status: DC

## 2018-01-04 NOTE — Progress Notes (Signed)
Subjective:    Patient ID: [name] [name], female    DOB: 1945/12/01, 72 y.o.   MRN: [id number]  HPI The patient is here for an acute visit for stomach pain, changes in bowel habits.  Gastritis, GERD:  She was here 12/11/17 for burning in her chest , increased gerd and epigastric pain.  I switched her PPI to Dexilant once a day, which she is taking.  I also put her on Carafate, which was not covered by insurance but she did purchase.  She just finished that 2 days ago and she feels it was helpful.  Abdominal pain, diarrhea: 2 days ago she started experiencing abdominal pain in her left lower quadrant, but she also has it across her upper abdomen.  She has had diarrhea a few times a day, rectal pain and blood in the stool and with wiping.  She believes she has had an episode of diverticulitis in the past.  Her husband wonders if her eating popcorn recently may have flared the symptoms up.  He is feeling well and has no similar symptoms.  She denies any sick contacts with mixed symptoms.  She denies any fevers, chills, dysuria, hematuria.  She has had some mild headaches, lightheadedness and fatigue.   Last EGD  2013.    Medications and allergies reviewed with patient and updated if appropriate.  Patient Active Problem List   Diagnosis Date Noted  . Bilateral shoulder pain 12/11/2017  . Gastritis 12/11/2017  . Trigger finger, right little finger 09/11/2017  . Numbness and tingling in left hand 07/20/2017  . Dupuytren's contracture of hand 07/20/2017  . CTS (carpal tunnel syndrome) 07/17/2017  . Bilateral carpal tunnel syndrome 07/17/2017  . Arthralgia 06/30/2017  . Paresthesia 06/10/2017  . Cramping of hands 06/10/2017  . Leg cramps 06/10/2017  . 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 Chronic midline low back pain without sciatica 09/15/2016  . Osteopenia 07/20/2016  . Bilateral finger numbness 06/03/2016  . Joint pain 06/03/2016  . Contusion of right leg 04/03/2016   . Left upper arm pain 12/20/2015  . Atypical chest pain 02/09/2015  . Hyperlipidemia 02/09/2015  . Primary localized osteoarthrosis, lower leg 02/22/2014  . Osteoarthritis of right knee 10/03/2013  . Lateral meniscal tear 10/03/2013  . 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 Varicose vein of leg   . Knee pain, bilateral 05/03/2013  . Allergic rhinitis   . IBS (irritable bowel syndrome) 04/07/2011  . Migraine headache 09/27/2010  . Essential hypertension 09/27/2010  . GERD 09/27/2010  . OSTEOARTHRITIS 09/27/2010  . HEADACHE 09/27/2010  . RENAL CALCULUS, HX OF 09/27/2010    Current Outpatient Medications on File Prior to Visit  Medication Sig Dispense Refill  . aspirin 81 MG tablet Take 81 mg by mouth 3 (three) times a week. Monday, Wednesday, and Fridays    . atorvastatin (LIPITOR) 40 MG tablet TAKE ONE-HALF TABLET BY MOUTH ONCE DAILY 45 tablet 1  . butalbital-acetaminophen-caffeine (FIORICET, ESGIC) 50-325-40 MG tablet Take 1 tablet by mouth every 4 (four) hours as needed for headache. 30 tablet 0  . calcium carbonate (OS-CAL) 600 MG tablet Take 1 tablet (600 mg total) by mouth daily. 60 tablet   . Cholecalciferol (VITAMIN D3) 1000 units CAPS Take 1,000 Units by mouth 3 (three) times a week.     . cloNIDine (CATAPRES) 0.1 MG tablet TAKE ONE TABLET BY MOUTH ONCE DAILY AS NEEDED FOR BP OVER  160/90, REPEAT IN ! HOUR IF NEEDED -- Office visit  needed for further refills 30 tablet 0  . dexlansoprazole (DEXILANT) 60 MG capsule Take 1 capsule (60 mg total) by mouth daily. 90 capsule 5  . losartan (COZAAR) 25 MG tablet TAKE ONE TABLET BY MOUTH ONCE DAILY 90 tablet 2  . metoprolol succinate (TOPROL-XL) 50 MG 24 hr tablet Take 1 tablet (50 mg total) by mouth daily. 90 tablet 3   No current facility-administered medications on file prior to visit.     Past Medical History:  Diagnosis Date  . Atypical chest pain   . Gastritis 11/08/2007, 04/12/2012   H pylori bx neg 03/2012  . GERD  (gastroesophageal reflux disease) 11/08/2007, 04/12/2012  . Hiatal hernia 11/08/2007, 04/12/2012  . History of kidney stones   . Hyperlipidemia   . Hypertension   . IBS (irritable bowel syndrome)    diarrhea predom  . Migraine   . Osteoporosis 09/27/2010   DEXA 01/21/2012: -2.1 spine and R fem, -1.8 L fem s/p fosamax  2004-2011 DEXA 04/18/14 @ [facility]: -2.5 - rec to start Prolia    . PVC's (premature ventricular contractions)   . 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 Shingles outbreak 04/2013    Past Surgical History:  Procedure Laterality Date  . APPENDECTOMY  1958  . CYSTOCELE REPAIR  2008  . Maxilofacial  1992  . TONSILLECTOMY  1960    Social History   Socioeconomic History  . Marital status: Married    Spouse name: None  . Number of children: None  . Years of education: None  . Highest education level: None  Social Needs  . Financial resource strain: None  . Food insecurity - worry: None  . Food insecurity - inability: None  . Transportation needs - medical: None  . Transportation needs - non-medical: None  Occupational History  . None  Tobacco Use  . Smoking status: Never Smoker  . Smokeless tobacco: Never Used  Substance and Sexual Activity  . Alcohol use: No  . Drug use: No  . Sexual activity: None  Other Topics Concern  . None  Social History Narrative   Married, lives with spouse. Retired Licensed conveyancer, Print production planner. Has 3 kids (moved to [place] to be closer)- youngest son MD. Dorie Rank to [place] from [place] 06/2010, lived in [place] x 10years    Family History  Problem Relation Age of Onset  . Hypertension Mother   . Alzheimer's disease Mother 89  . AAA (abdominal aortic aneurysm) Mother   . Stroke Father 41  . Bone cancer Brother 25  . Colon cancer Neg Hx     Review of Systems  Constitutional: Positive for fatigue. Negative for chills and fever.  Respiratory: Negative for cough, shortness of breath and wheezing.   Cardiovascular:  Negative for chest pain.  Gastrointestinal: Positive for abdominal pain (cramping), anal bleeding, blood in stool, diarrhea and nausea. Negative for constipation.  Musculoskeletal: Negative for myalgias.  Neurological: Positive for light-headedness and headaches.       Objective:   Vitals:   01/04/18 1504  BP: 114/70  Pulse: (!) 45  Resp: 16  Temp: 98 F (36.7 C)  SpO2: 96%   Wt Readings from Last 3 Encounters:  01/04/18 151 lb (68.5 kg)  12/11/17 149 lb (67.6 kg)  10/06/17 146 lb (66.2 kg)   Body mass index is 26.75 kg/m.   Physical Exam    Constitutional: Appears well-developed and well-nourished. No distress.  HENT:  Head: Normocephalic and atraumatic.  Neck: Neck supple. No  tracheal deviation present. No thyromegaly present.  No cervical lymphadenopathy Cardiovascular: Normal rate, regular rhythm and normal heart sounds.   No murmur heard.   No edema Pulmonary/Chest: Effort normal and breath sounds normal. No respiratory distress. No has no wheezes. No rales.  Abdomen: tenderness across upper abdomen and LLQ w/o rebound or guarding, no masses, no hepatosplenomegaly Skin: Skin is warm and dry. Not diaphoretic.  Psychiatric: Normal mood and affect. Behavior is normal.      Assessment & Plan:    See Problem List for Assessment and Plan of chronic medical problems.

## 2018-01-04 NOTE — Assessment & Plan Note (Signed)
2 days of left lower quadrant pain and pain across her upper abdomen associated with diarrhea, blood in the stool/blood with wiping She states she has had diverticulitis in the past, but I am unsure when CT scan of abdomen and pelvis-cannot be done until tomorrow Start empiric Augmentin twice daily times 10 days Check blood work including CBC, CMP, amylase and lipase Bland, low fiber diet Given accumulation of GI symptoms will put in referral for GI now, but if all of her symptoms resolve she may not need to be seen

## 2018-01-04 NOTE — Assessment & Plan Note (Signed)
Her gastritis symptoms seem to have improved with the Carafate and changing pantoprazole to Dexilant She completed the Carafate 2 days ago Continue Dexilant once daily Given her other gastrointestinal symptoms I will refer to GI

## 2018-01-04 NOTE — Patient Instructions (Signed)
Get blood work tomorrow.   Test(s) ordered today. Your results will be released to [place] (or called to you) after review, usually within 72hours after test completion. If any changes need to be made, you will be notified at that same time.  Medications reviewed and updated.  Changes include taking the antibiotic as prescribed.  Your prescription(s) have been submitted to your pharmacy. Please take as directed and contact our office if you believe you are having problem(s) with the medication(s).  A referral was ordered for GI.

## 2018-01-04 NOTE — Assessment & Plan Note (Signed)
Improved, currently controlled Continue Dexilant once daily Discussed that we can increase this if her heartburn is not controlled Recent flare of GERD may have been from a supplement she was taking for her eyes, which she has discontinued

## 2018-01-05 ENCOUNTER — Ambulatory Visit (INDEPENDENT_AMBULATORY_CARE_PROVIDER_SITE_OTHER)
Admission: RE | Admit: 2018-01-05 | Discharge: 2018-01-05 | Disposition: A | Discharge status: Home / Self Care | Payer: PPO | Source: Ambulatory Visit | Attending: Internal Medicine | Admitting: Internal Medicine

## 2018-01-05 ENCOUNTER — Encounter: Payer: Self-pay | Admitting: Gastroenterology

## 2018-01-05 DIAGNOSIS — R1032 Left lower quadrant pain: Secondary | ICD-10-CM | POA: Diagnosis not present

## 2018-01-05 DIAGNOSIS — R109 Unspecified abdominal pain: Secondary | ICD-10-CM | POA: Diagnosis not present

## 2018-01-05 DIAGNOSIS — R197 Diarrhea, unspecified: Secondary | ICD-10-CM | POA: Diagnosis not present

## 2018-01-05 MED ORDER — IOPAMIDOL (ISOVUE-300) INJECTION 61%
100.0000 mL | Freq: Once | INTRAVENOUS | Status: AC | PRN
  Administered 2018-01-05: 100 mL via INTRAVENOUS

## 2018-01-11 ENCOUNTER — Encounter: Payer: Self-pay | Admitting: Internal Medicine

## 2018-01-11 ENCOUNTER — Ambulatory Visit (INDEPENDENT_AMBULATORY_CARE_PROVIDER_SITE_OTHER): Payer: PPO | Admitting: Gastroenterology

## 2018-01-11 ENCOUNTER — Encounter: Payer: Self-pay | Admitting: Gastroenterology

## 2018-01-11 VITALS — BP 122/70 | HR 80 | Ht 61.25 in | Wt 149.2 lb

## 2018-01-11 DIAGNOSIS — K219 Gastro-esophageal reflux disease without esophagitis: Secondary | ICD-10-CM | POA: Diagnosis not present

## 2018-01-11 DIAGNOSIS — R935 Abnormal findings on diagnostic imaging of other abdominal regions, including retroperitoneum: Secondary | ICD-10-CM

## 2018-01-11 DIAGNOSIS — R197 Diarrhea, unspecified: Secondary | ICD-10-CM

## 2018-01-11 DIAGNOSIS — R109 Unspecified abdominal pain: Secondary | ICD-10-CM

## 2018-01-11 MED ORDER — DEXLANSOPRAZOLE 30 MG PO CPDR: 30.0000 mg | DELAYED_RELEASE_CAPSULE | Freq: Every day | ORAL | 1 refills | Status: DC

## 2018-01-11 MED ORDER — DICYCLOMINE HCL 10 MG PO CAPS: 10.0000 mg | ORAL_CAPSULE | Freq: Three times a day (TID) | ORAL | 1 refills | Status: DC | PRN

## 2018-01-11 MED ORDER — SACCHAROMYCES BOULARDII 250 MG PO CAPS: 250.0000 mg | ORAL_CAPSULE | Freq: Two times a day (BID) | ORAL | Status: DC

## 2018-01-11 NOTE — Progress Notes (Signed)
HPI :  72 year old female with a history of GERD reported prior history of IBS, history of Schatzki ring status post dilation remotely, referred by Dr. Billey Gosling for abdominal pain and diarrhea. Previously followed by Dr. Sharlett Iles, last seen in 2013.  She states about a week ago she developed acute onset of severe watery diarrhea abdominal cramping.  She states the pain was worst in the right lower quadrant and left, as well as some discomfort in the upper abdomen diffusely.  She states the pain initially would come and go, it was often associated with the urgency to have a bowel movement and relieved with a bowel movement.  She was set up for a CT scan by Dr. Quay Burow and empirically given a course of Augmentin for possible diverticulitis.  CT scan was performed on February 12 which showed some mild enhancement of the left colon, nonspecific.  She had multiple benign appearing hepatic cysts and mild steatosis with a normal gallbladder normal pancreas.  She had a CBC done which showed a normal hemoglobin and normal white blood cell count.  Amylase and lipase were normal.  Liver function testing and renal function testing were normal.  She states when her symptoms first started she had one episode of a scant amount of blood, she denies any further blood in her stools.  She states after a few days her diarrhea completely resolved.  She is back to having 1 formed bowel movement per day.  Her abdominal pain has also improved over time.  She has occasional mild cramping still but it is much better than previous.  Overall she feels she is significantly improved.  She denies any fevers during this time.  She denies any NSAID use.  She denies any family history of colon cancer.  Her last colonoscopy was in 2013 in which she had no polyps removed.  She otherwise states she has a history of heartburn long-standing.  She has previously been on once daily dosing of omeprazole and pantoprazole which she states she had  breakthrough on both of those regimens.  She was placed on Dexilant 60 mg/day for this issue, she thinks this dose for the past few months.  She states it works quite well to control her heartburn she does not have any breakthrough.  She has not needed to use any Tums recently.  She denies any dysphasia.   Endoscopic history : EGD 04/12/2012 - 3cm HH, otherwise normal, clo test negative Colonoscopy 04/12/2012 - diverticulosis, no polyps EGD 10/2008 - mild Shatski ring dilated to 54Fr Venia Minks, small HH  CT scan 01/05/2018 - multiple hepatic cysts, mild diffuse hepatic steatosis, normal GB, normal pancreas. "Mild enhancement" of left colon, nonspecific  HIDA 01/01/2017 - normal [place] RUQ 12/26/2016 - no gallstones    Past Medical History:  Diagnosis Date  . Atypical chest pain   . Gastritis 11/08/2007, 04/12/2012   H pylori bx neg 03/2012  . GERD (gastroesophageal reflux disease) 11/08/2007, 04/12/2012  . Hiatal hernia 11/08/2007, 04/12/2012  . History of kidney stones   . Hyperlipidemia   . Hypertension   . IBS (irritable bowel syndrome)    diarrhea predom  . Migraine   . Osteoporosis 09/27/2010   DEXA 01/21/2012: -2.1 spine and R fem, -1.8 L fem s/p fosamax  2004-2011 DEXA 04/18/14 @ [facility]: -2.5 - rec to start Prolia    . PVC's (premature ventricular contractions)   . 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 Shingles outbreak 04/2013  Past Surgical History:  Procedure Laterality Date  . APPENDECTOMY  1958  . CYSTOCELE REPAIR  2008  . Maxilofacial  1992  . TONSILLECTOMY  1960   Family History  Problem Relation Age of Onset  . Hypertension Mother   . Alzheimer's disease Mother 7  . AAA (abdominal aortic aneurysm) Mother   . Stroke Father 74  . Bone cancer Brother 66  . Colon cancer Neg Hx    Social History   Tobacco Use  . Smoking status: Never Smoker  . Smokeless tobacco: Never Used  Substance Use Topics  . Alcohol use: No  . Drug use: No    Current Outpatient Medications  Medication Sig Dispense Refill  . amoxicillin-clavulanate (AUGMENTIN) 875-125 MG tablet Take 1 tablet by mouth 2 (two) times daily. 20 tablet 0  . aspirin 81 MG tablet Take 81 mg by mouth 3 (three) times a week. Monday, Wednesday, and Fridays    . atorvastatin (LIPITOR) 40 MG tablet TAKE ONE-HALF TABLET BY MOUTH ONCE DAILY 45 tablet 1  . butalbital-acetaminophen-caffeine (FIORICET, ESGIC) 50-325-40 MG tablet Take 1 tablet by mouth every 4 (four) hours as needed for headache. 30 tablet 0  . calcium carbonate (OS-CAL) 600 MG tablet Take 1 tablet (600 mg total) by mouth daily. 60 tablet   . Cholecalciferol (VITAMIN D3) 1000 units CAPS Take 1,000 Units by mouth 3 (three) times a week.     . cloNIDine (CATAPRES) 0.1 MG tablet TAKE ONE TABLET BY MOUTH ONCE DAILY AS NEEDED FOR BP OVER  160/90, REPEAT IN ! HOUR IF NEEDED -- Office visit needed for further refills 30 tablet 0  . dexlansoprazole (DEXILANT) 60 MG capsule Take 1 capsule (60 mg total) by mouth daily. 90 capsule 5  . losartan (COZAAR) 25 MG tablet TAKE ONE TABLET BY MOUTH ONCE DAILY 90 tablet 2  . metoprolol succinate (TOPROL-XL) 50 MG 24 hr tablet Take 1 tablet (50 mg total) by mouth daily. 90 tablet 3   No current facility-administered medications for this visit.    No Known Allergies   Review of Systems: All systems reviewed and negative except where noted in HPI.    Ct Abdomen Pelvis W Contrast  Result Date: 01/05/2018 CLINICAL DATA:  Acute onset 2 days ago of left lower quadrant abdominal pain, diarrhea and hematochezia. Surgical history includes appendectomy. Personal history of urinary tract calculi. EXAM: CT ABDOMEN AND PELVIS WITH CONTRAST TECHNIQUE: Multidetector CT imaging of the abdomen and pelvis was performed using the standard protocol following bolus administration of intravenous contrast. CONTRAST:  171mL ISOVUE-300 IOPAMIDOL INJECTION 61% IV. Oral contrast was also administered.  COMPARISON:  01/02/2011. FINDINGS: Lower chest: Minimal scarring in the lingula, right middle lobe and both lower lobes, unchanged. Visualized lung bases otherwise clear. Heart size upper normal and stable. Hepatobiliary: Multiple hepatic cysts as noted previously, the largest in the posterior segment right lobe measuring approximately 2.4 x 2.0 cm (series 2, image 18), unchanged since the prior CT. No new suspicious hepatic masses. Mild diffuse hepatic steatosis. Gallbladder normal in appearance without calcified gallstones. No biliary ductal dilation. Pancreas: Normal in appearance without evidence of mass, ductal dilation, or inflammation. Spleen: Normal in size and appearance. Adrenals/Urinary Tract: Normal appearing adrenal glands. Simple cyst arising from the upper pole of the left kidney measuring approximately 5.5 cm diameter, increased in size since the prior CT. Scattered small cortical cysts elsewhere in both kidneys. No solid renal masses. No hydronephrosis. No urinary tract calculi on either side. Normal appearing decompressed  urinary bladder. Stomach/Bowel: Small hiatal hernia. Stomach decompressed and otherwise normal in appearance. Normal-appearing small bowel. Transverse colon, descending colon, sigmoid colon and rectum relatively decompressed, though there is mild mucosal enhancement throughout these portions of the colon. Scattered sigmoid colon diverticula without evidence of pericolonic inflammation. Moderate stool burden in the normal-appearing ascending colon. Appendix surgically absent. Vascular/Lymphatic: Moderate aorto-iliofemoral atherosclerosis without evidence of aneurysm. Normal-appearing portal venous and systemic venous systems. No pathologic lymphadenopathy. Reproductive: Atrophic uterus and ovaries as expected for age. No adnexal masses or free pelvic fluid. Other: None. Musculoskeletal: Facet degenerative changes involving the lower lumbar spine. Degenerative grade 1  spondylolisthesis of L5 on transitional L6 (a prior chest x-ray from March, 2016 demonstrated 12 rib-bearing thoracic vertebrae, indicating that there are 6 lumbar type vertebrae with L6 representing a transitional lumbosacral segment). IMPRESSION: 1. Scattered sigmoid colon diverticula without evidence of acute diverticulitis. 2. Mucosal enhancement involving the transverse colon, descending colon, sigmoid colon and rectum which can be seen in patients with colitis. 3. No acute abnormalities otherwise involving the abdomen or pelvis. 4. Small hiatal hernia. 5. Benign hepatic cysts and bilateral renal cortical cysts. No evidence of urinary tract calculi currently. 6.  Aortic Atherosclerosis (ICD10-170.0) 7. Skeletal findings as detailed above. Electronically Signed   By: Evangeline Dakin M.D.   On: 01/05/2018 10:59    Physical Exam: Ht 5' 1.25" (1.556 m)   Wt 149 lb 4 oz (67.7 kg)   BMI 27.97 kg/m  Constitutional: Pleasant,well-developed, female in no acute distress. HEENT: Normocephalic and atraumatic. Conjunctivae are normal. No scleral icterus. Neck supple.  Cardiovascular: Normal rate, regular rhythm.  Pulmonary/chest: Effort normal and breath sounds normal. No wheezing, rales or rhonchi. Abdominal: Soft, nondistended, nontender. There are no masses palpable. No hepatomegaly. Extremities: no edema Lymphadenopathy: No cervical adenopathy noted. Neurological: Alert and oriented to person place and time. Skin: Skin is warm and dry. No rashes noted. Psychiatric: Normal mood and affect. Behavior is normal.   ASSESSMENT AND PLAN: 71 year old female with history as outlined above, here for new patient assessment of the following issues:  Acute diarrhea / abdominal pain / abnormal CT scan of the abdomen - I suspect she most likely had an infectious enteritis / colitis to have caused her symptoms, while mild ischemic colitis also possible.  If she had ischemic colitis I do not see any high risk  medications to be related to this. She has mostly finished an empiric course of Augmentin, and her diarrhea has completely resolved.  I think the yield of stool testing at this point is low.  She reports a history of IBS in the past she has some mild cramping associated with a bowel movement still, will give her some Bentyl to use as needed cramps.  Given her recent antibiotic use in this course I recommend starting a probiotic such as Florastor OTC twice a day for the next 4-6 weeks.  In general she is significantly improved and feels much better, I expect her to continue to progress.  Her last colonoscopy was in 2013 and normal, I do not feel strongly she needs a colonoscopy at this time unless she has persistent or recurrent symptoms.  She was in agreement this - she will monitor for symptoms, trial of probiotic and Bentyl as needed, follow-up as needed if symptoms recur moving forward.  GERD - long-standing symptoms, had some breakthrough despite omeprazole and pantoprazole in the past.  On Dexilant 60 mg/day which has completely resolved her reflux symptoms/epigastric pain.  We  discussed long-term management of this issue.  I discussed the long-term risks and benefits of chronic PPI use.  Her renal function is normal.  Long-term I recommend the lowest dose of medication needed to control her symptoms.  At this point will reduce Dexilant to 30 mg once per day.  If she has breakthrough on this regimen she can increase back to 60 mg/day as needed.  She has not had any evidence of Barrett's esophagus on prior EGD, which is reassuring.  She can follow-up as needed for this issue.  [place] Cellar, MD [facility] Pager [phone]  CC: Binnie Rail, MD

## 2018-01-11 NOTE — Patient Instructions (Addendum)
If you are age 73 or older, your body mass index should be between 23-30. Your Body mass index is 27.97 kg/m. If this is out of the aforementioned range listed, please consider follow up with your Primary Care Provider.  If you are age 82 or younger, your body mass index should be between 19-25. Your Body mass index is 27.97 kg/m. If this is out of the aformentioned range listed, please consider follow up with your Primary Care Provider.   Please finish your current round of antibiotics.  Purchase Florastor, over the counter, and take twice a day.  We have sent the following medications to your pharmacy for you to pick up at your convenience: Bentyl 10mg  Dexilant 30 mg (decreased from 60 mg)  Thank you for entrusting me with your care and for choosing [facility], Dr. [place] Cellar

## 2018-01-13 DIAGNOSIS — M67912 Unspecified disorder of synovium and tendon, left shoulder: Secondary | ICD-10-CM | POA: Diagnosis not present

## 2018-01-13 DIAGNOSIS — M67911 Unspecified disorder of synovium and tendon, right shoulder: Secondary | ICD-10-CM | POA: Diagnosis not present

## 2018-01-15 DIAGNOSIS — M25512 Pain in left shoulder: Secondary | ICD-10-CM | POA: Diagnosis not present

## 2018-01-15 DIAGNOSIS — M25511 Pain in right shoulder: Secondary | ICD-10-CM | POA: Diagnosis not present

## 2018-01-15 DIAGNOSIS — M7542 Impingement syndrome of left shoulder: Secondary | ICD-10-CM | POA: Diagnosis not present

## 2018-01-15 DIAGNOSIS — M7541 Impingement syndrome of right shoulder: Secondary | ICD-10-CM | POA: Diagnosis not present

## 2018-01-18 DIAGNOSIS — M25511 Pain in right shoulder: Secondary | ICD-10-CM | POA: Diagnosis not present

## 2018-01-18 DIAGNOSIS — M7541 Impingement syndrome of right shoulder: Secondary | ICD-10-CM | POA: Diagnosis not present

## 2018-01-18 DIAGNOSIS — M25512 Pain in left shoulder: Secondary | ICD-10-CM | POA: Diagnosis not present

## 2018-03-04 ENCOUNTER — Telehealth: Payer: Self-pay | Admitting: Emergency Medicine

## 2018-03-04 NOTE — Telephone Encounter (Signed)
Called patient to schedule AWV. No answer, will call patient back another time.

## 2018-05-04 ENCOUNTER — Encounter: Payer: Self-pay | Admitting: Internal Medicine

## 2018-05-06 NOTE — Progress Notes (Signed)
Subjective:    Patient ID: [name] [name], female    DOB: October 12, 1946, 72 y.o.   MRN: [id number]  HPI The patient is here for an acute visit.  She has three growths- on her right arm, back and right leg.  They itch.  She started applying an over-the-counter cream she got from [place] on the growth on her arm and it did come off.  She is unsure what it was.  They do itch and bother her.  She wanted to make sure they were not concerning.  Knee pain:  She has had knee pain for years.  She has seen Dr. Tamala Julian in the past.  She is wears braces and that helped significantly for her to walk and move around.  The pain is getting worse and most of her pain is at night or getting up after sitting long periods of time.  She can walk as long as she is wearing her braces.  She does not walk long distances or exercise regularly.  She does take Tylenol as needed.  She is interested in establishing with orthopedics-she is unsure if she wants surgery right now or in the near future.  She has had steroid injections and they have been helping for approximately 6 months.   Medications and allergies reviewed with patient and updated if appropriate.  Patient Active Problem List   Diagnosis Date Noted  . Skin abnormalities 05/07/2018  . Left lower quadrant pain 01/04/2018  . Bilateral shoulder pain 12/11/2017  . Gastritis 12/11/2017  . Trigger finger, right little finger 09/11/2017  . Numbness and tingling in left hand 07/20/2017  . Dupuytren's contracture of hand 07/20/2017  . CTS (carpal tunnel syndrome) 07/17/2017  . Bilateral carpal tunnel syndrome 07/17/2017  . Arthralgia 06/30/2017  . Paresthesia 06/10/2017  . Cramping of hands 06/10/2017  . Leg cramps 06/10/2017  . 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 Chronic midline low back pain without sciatica 09/15/2016  . Osteopenia 07/20/2016  . Bilateral finger numbness 06/03/2016  . Joint pain 06/03/2016  . Atypical chest pain  02/09/2015  . Hyperlipidemia 02/09/2015  . Bilateral primary osteoarthritis of knee 10/03/2013  . Lateral meniscal tear 10/03/2013  . 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 Varicose vein of leg   . Allergic rhinitis   . IBS (irritable bowel syndrome) 04/07/2011  . Migraine headache 09/27/2010  . Essential hypertension 09/27/2010  . GERD 09/27/2010  . HEADACHE 09/27/2010  . RENAL CALCULUS, HX OF 09/27/2010    Current Outpatient Medications on File Prior to Visit  Medication Sig Dispense Refill  . aspirin 81 MG tablet Take 81 mg by mouth 3 (three) times a week. Monday, Wednesday, and Fridays    . butalbital-acetaminophen-caffeine (FIORICET, ESGIC) 50-325-40 MG tablet Take 1 tablet by mouth every 4 (four) hours as needed for headache. 30 tablet 0  . calcium carbonate (OS-CAL) 600 MG tablet Take 1 tablet (600 mg total) by mouth daily. 60 tablet   . Cholecalciferol (VITAMIN D3) 1000 units CAPS Take 1,000 Units by mouth 3 (three) times a week.     . cloNIDine (CATAPRES) 0.1 MG tablet TAKE ONE TABLET BY MOUTH ONCE DAILY AS NEEDED FOR BP OVER  160/90, REPEAT IN ! HOUR IF NEEDED -- Office visit needed for further refills 30 tablet 0  . Dexlansoprazole (DEXILANT) 30 MG capsule Take 1 capsule (30 mg total) by mouth daily. 30 capsule 1  . dicyclomine (BENTYL) 10 MG capsule Take 1 capsule (10 mg  total) by mouth every 8 (eight) hours as needed for spasms. 30 capsule 1   No current facility-administered medications on file prior to visit.     Past Medical History:  Diagnosis Date  . Arthritis   . Atypical chest pain   . Diverticulosis   . Gastritis 11/08/2007, 04/12/2012   H pylori bx neg 03/2012  . GERD (gastroesophageal reflux disease) 11/08/2007, 04/12/2012  . Hepatic cyst   . Hiatal hernia 11/08/2007, 04/12/2012  . History of kidney stones   . Hyperlipidemia   . Hypertension   . IBS (irritable bowel syndrome)    diarrhea predom  . Migraine   . Osteoporosis 09/27/2010   DEXA  01/21/2012: -2.1 spine and R fem, -1.8 L fem s/p fosamax  2004-2011 DEXA 04/18/14 @ [facility]: -2.5 - rec to start Prolia    . PVC's (premature ventricular contractions)   . 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 Shingles outbreak 04/2013    Past Surgical History:  Procedure Laterality Date  . APPENDECTOMY  1958  . CYSTOCELE REPAIR  2008  . Maxilofacial  1992  . TONSILLECTOMY  1960    Social History   Socioeconomic History  . Marital status: Married    Spouse name: Not on file  . Number of children: 2  . Years of education: Not on file  . Highest education level: Not on file  Occupational History  . Occupation: retired  Scientific laboratory technician  . Financial resource strain: Not on file  . Food insecurity:    Worry: Not on file    Inability: Not on file  . Transportation needs:    Medical: Not on file    Non-medical: Not on file  Tobacco Use  . Smoking status: Never Smoker  . Smokeless tobacco: Never Used  Substance and Sexual Activity  . Alcohol use: No  . Drug use: No  . Sexual activity: Not on file  Lifestyle  . Physical activity:    Days per week: Not on file    Minutes per session: Not on file  . Stress: Not on file  Relationships  . Social connections:    Talks on phone: Not on file    Gets together: Not on file    Attends religious service: Not on file    Active member of club or organization: Not on file    Attends meetings of clubs or organizations: Not on file    Relationship status: Not on file  Other Topics Concern  . Not on file  Social History Narrative   Married, lives with spouse. Retired Licensed conveyancer, Print production planner. Has 3 kids (moved to [place] to be closer)- youngest son MD. Dorie Rank to [place] from [place] 06/2010, lived in [place] x 10years    Family History  Problem Relation Age of Onset  . Hypertension Mother   . Alzheimer's disease Mother 106  . AAA (abdominal aortic aneurysm) Mother   . Stroke Father 49  . Kidney cancer Brother         mets  . Bone cancer Brother 20  . Colon cancer Neg Hx     Review of Systems  Constitutional: Negative for fever.  Musculoskeletal: Positive for arthralgias and joint swelling.  Skin: Positive for color change (Changing mole-groin).       Objective:   Vitals:   05/07/18 0906  BP: (!) 142/72  Pulse: 69  Resp: 16  Temp: 98.7 F (37.1 C)  SpO2: 97%   BP Readings from Last  3 Encounters:  05/07/18 (!) 142/72  01/11/18 122/70  01/04/18 114/70   Wt Readings from Last 3 Encounters:  05/07/18 142 lb (64.4 kg)  01/11/18 149 lb 4 oz (67.7 kg)  01/04/18 151 lb (68.5 kg)   Body mass index is 26.61 kg/m.   Physical Exam  Constitutional: She appears well-developed and well-nourished. No distress.  HENT:  Head: Normocephalic and atraumatic.  Musculoskeletal: She exhibits no edema.  Bilateral knees normal-appearing-no effusions or deformities  Skin: Skin is warm and dry. She is not diaphoretic.  Mole on right arm is now only what appears to be eschar; mole on right upper back is a raised, dry appearing mole           Assessment & Plan:    See Problem List for Assessment and Plan of chronic medical problems.

## 2018-05-07 ENCOUNTER — Ambulatory Visit (INDEPENDENT_AMBULATORY_CARE_PROVIDER_SITE_OTHER): Payer: PPO | Admitting: Internal Medicine

## 2018-05-07 ENCOUNTER — Encounter: Payer: Self-pay | Admitting: Internal Medicine

## 2018-05-07 VITALS — BP 142/72 | HR 69 | Temp 98.7°F | Resp 16 | Wt 142.0 lb

## 2018-05-07 DIAGNOSIS — L989 Disorder of the skin and subcutaneous tissue, unspecified: Secondary | ICD-10-CM | POA: Diagnosis not present

## 2018-05-07 DIAGNOSIS — M170 Bilateral primary osteoarthritis of knee: Secondary | ICD-10-CM | POA: Diagnosis not present

## 2018-05-07 MED ORDER — ATORVASTATIN CALCIUM 40 MG PO TABS: 20.0000 mg | ORAL_TABLET | Freq: Every day | ORAL | 1 refills | Status: DC

## 2018-05-07 MED ORDER — LOSARTAN POTASSIUM 25 MG PO TABS: 25.0000 mg | ORAL_TABLET | Freq: Every day | ORAL | 2 refills | Status: DC

## 2018-05-07 MED ORDER — METOPROLOL SUCCINATE ER 50 MG PO TB24: 50.0000 mg | ORAL_TABLET | Freq: Every day | ORAL | 3 refills | Status: DC

## 2018-05-07 NOTE — Patient Instructions (Addendum)
Try treating your skin moles - apple cider vinegar, tea tree oil or other natural remedies.  If it does not work we can refer you to a dermatologist.       A referral was ordered for Dr Mardelle Matte.

## 2018-05-07 NOTE — Assessment & Plan Note (Signed)
She has 3 normal-appearing moles that have gotten larger She was able to get 1 to go away with a over-the-counter product from [place], but does not have that here Will try apple cider vinegar and some other natural ways of getting them to resolve If they persist I do recommend that she sees dermatology to have the removed

## 2018-05-07 NOTE — Assessment & Plan Note (Signed)
Bilateral knee osteoarthritis Has seen Dr. Tamala Julian Using braces, which does help Steroid injections helped for 6 months She is interested in seeing orthopedics-agrees that she will need surgery at some point-possibly in the near future Will refer to Dr. Mardelle Matte per her request

## 2018-06-03 ENCOUNTER — Ambulatory Visit (INDEPENDENT_AMBULATORY_CARE_PROVIDER_SITE_OTHER): Payer: PPO | Admitting: Internal Medicine

## 2018-06-03 ENCOUNTER — Encounter: Payer: Self-pay | Admitting: Internal Medicine

## 2018-06-03 VITALS — BP 138/88 | HR 56 | Temp 98.0°F | Resp 16 | Wt 142.0 lb

## 2018-06-03 DIAGNOSIS — K2900 Acute gastritis without bleeding: Secondary | ICD-10-CM | POA: Diagnosis not present

## 2018-06-03 DIAGNOSIS — H1131 Conjunctival hemorrhage, right eye: Secondary | ICD-10-CM

## 2018-06-03 DIAGNOSIS — R519 Headache, unspecified: Secondary | ICD-10-CM | POA: Insufficient documentation

## 2018-06-03 DIAGNOSIS — R51 Headache: Secondary | ICD-10-CM

## 2018-06-03 DIAGNOSIS — G4486 Cervicogenic headache: Secondary | ICD-10-CM | POA: Insufficient documentation

## 2018-06-03 HISTORY — DX: Cervicogenic headache: G44.86

## 2018-06-03 MED ORDER — KETOROLAC TROMETHAMINE 30 MG/ML IJ SOLN
30.0000 mg | Freq: Once | INTRAMUSCULAR | Status: AC
  Administered 2018-06-03: 30 mg via INTRAMUSCULAR

## 2018-06-03 MED ORDER — PROMETHAZINE HCL 25 MG/ML IJ SOLN
25.0000 mg | Freq: Once | INTRAMUSCULAR | Status: AC
  Administered 2018-06-03: 25 mg via INTRAMUSCULAR

## 2018-06-03 NOTE — Assessment & Plan Note (Signed)
Epigastric pain and nausea possibly related to mild gastritis, which she has had in the past and has a long history of GERD Continue Dexilant daily Hold Fioricet Tylenol if needed Can take Pepcid or Zantac along with Dexilant if needed Call if no improvement

## 2018-06-03 NOTE — Patient Instructions (Signed)
You received an injection of an anti-nausea medication and a pain medication for your migraine.     Do not take any Fioricet.    Call if no improvement

## 2018-06-03 NOTE — Assessment & Plan Note (Signed)
States his headache is similar to her migraines Fioricet not effective and now headache is intractable Discontinue Fioricet-may be getting some rebound headaches and also getting epigastric discomfort secondary to Fioricet Phenergan 25 mg IM x1 Toradol 30 mg IM x1 Call if headache does not improve

## 2018-06-03 NOTE — Assessment & Plan Note (Signed)
Right subconjunctival hemorrhage No concerning symptoms of eye pain or change in vision She has had similar episodes in the past in the same eye Discussed that hemorrhage will resolve over the next 2 weeks

## 2018-06-03 NOTE — Progress Notes (Signed)
Subjective:    Patient ID: [name] [name], female    DOB: 01-09-1946, 72 y.o.   MRN: [id number]  HPI The patient is here for an acute visit.  Headaches: She has a history of migraine headaches.  She takes Fioricet as needed when she gets a migraine headache.  5 days ago she got a headache noticed focused on more the right side of the head.  She thinks it was typical of her usual migraines.  She took a Fioricet and the headache did go away, but recurred and since then she has been taking a Fioricet every 4-6 hours.  She currently only has a dull headache.  Around the same time she also had a right conjunctival hemorrhage, which she has had in the past in that eye.  She is up-to-date with her ophthalmology exams.  She denies any pain or change in vision of the eye.  She does have some photophobia.   Marland Kitchen    Her blood pressure has been well controlled at home most recently was 117/76.     She has had some epigastric pain the past few days and some nausea.  She does have a long history of GERD and gastritis. She is taking the dexilant daily.    Medications and allergies reviewed with patient and updated if appropriate.  Patient Active Problem List   Diagnosis Date Noted  . Skin abnormalities 05/07/2018  . Left lower quadrant pain 01/04/2018  . Bilateral shoulder pain 12/11/2017  . Gastritis 12/11/2017  . Trigger finger, right little finger 09/11/2017  . Numbness and tingling in left hand 07/20/2017  . Dupuytren's contracture of hand 07/20/2017  . CTS (carpal tunnel syndrome) 07/17/2017  . Bilateral carpal tunnel syndrome 07/17/2017  . Arthralgia 06/30/2017  . Paresthesia 06/10/2017  . Cramping of hands 06/10/2017  . Leg cramps 06/10/2017  . 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 Chronic midline low back pain without sciatica 09/15/2016  . Osteopenia 07/20/2016  . Bilateral finger numbness 06/03/2016  . Joint pain 06/03/2016  . Atypical chest pain 02/09/2015  .  Hyperlipidemia 02/09/2015  . Bilateral primary osteoarthritis of knee 10/03/2013  . Lateral meniscal tear 10/03/2013  . 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 Varicose vein of leg   . Allergic rhinitis   . IBS (irritable bowel syndrome) 04/07/2011  . Migraine headache 09/27/2010  . Essential hypertension 09/27/2010  . GERD 09/27/2010  . HEADACHE 09/27/2010  . RENAL CALCULUS, HX OF 09/27/2010    Current Outpatient Medications on File Prior to Visit  Medication Sig Dispense Refill  . aspirin 81 MG tablet Take 81 mg by mouth 3 (three) times a week. Monday, Wednesday, and Fridays    . atorvastatin (LIPITOR) 40 MG tablet Take 0.5 tablets (20 mg total) by mouth daily. 45 tablet 1  . butalbital-acetaminophen-caffeine (FIORICET, ESGIC) 50-325-40 MG tablet Take 1 tablet by mouth every 4 (four) hours as needed for headache. 30 tablet 0  . calcium carbonate (OS-CAL) 600 MG tablet Take 1 tablet (600 mg total) by mouth daily. 60 tablet   . Cholecalciferol (VITAMIN D3) 1000 units CAPS Take 1,000 Units by mouth 3 (three) times a week.     . cloNIDine (CATAPRES) 0.1 MG tablet TAKE ONE TABLET BY MOUTH ONCE DAILY AS NEEDED FOR BP OVER  160/90, REPEAT IN ! HOUR IF NEEDED -- Office visit needed for further refills 30 tablet 0  . Dexlansoprazole (DEXILANT) 30 MG capsule Take 1 capsule (30 mg total) by mouth  daily. 30 capsule 1  . dicyclomine (BENTYL) 10 MG capsule Take 1 capsule (10 mg total) by mouth every 8 (eight) hours as needed for spasms. 30 capsule 1  . losartan (COZAAR) 25 MG tablet Take 1 tablet (25 mg total) by mouth daily. 90 tablet 2  . metoprolol succinate (TOPROL-XL) 50 MG 24 hr tablet Take 1 tablet (50 mg total) by mouth daily. 90 tablet 3   No current facility-administered medications on file prior to visit.     Past Medical History:  Diagnosis Date  . Arthritis   . Atypical chest pain   . Diverticulosis   . Gastritis 11/08/2007, 04/12/2012   H pylori bx neg 03/2012  . GERD  (gastroesophageal reflux disease) 11/08/2007, 04/12/2012  . Hepatic cyst   . Hiatal hernia 11/08/2007, 04/12/2012  . History of kidney stones   . Hyperlipidemia   . Hypertension   . IBS (irritable bowel syndrome)    diarrhea predom  . Migraine   . Osteoporosis 09/27/2010   DEXA 01/21/2012: -2.1 spine and R fem, -1.8 L fem s/p fosamax  2004-2011 DEXA 04/18/14 @ [facility]: -2.5 - rec to start Prolia    . PVC's (premature ventricular contractions)   . 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 Shingles outbreak 04/2013    Past Surgical History:  Procedure Laterality Date  . APPENDECTOMY  1958  . CYSTOCELE REPAIR  2008  . Maxilofacial  1992  . TONSILLECTOMY  1960    Social History   Socioeconomic History  . Marital status: Married    Spouse name: Not on file  . Number of children: 2  . Years of education: Not on file  . Highest education level: Not on file  Occupational History  . Occupation: retired  Scientific laboratory technician  . Financial resource strain: Not on file  . Food insecurity:    Worry: Not on file    Inability: Not on file  . Transportation needs:    Medical: Not on file    Non-medical: Not on file  Tobacco Use  . Smoking status: Never Smoker  . Smokeless tobacco: Never Used  Substance and Sexual Activity  . Alcohol use: No  . Drug use: No  . Sexual activity: Not on file  Lifestyle  . Physical activity:    Days per week: Not on file    Minutes per session: Not on file  . Stress: Not on file  Relationships  . Social connections:    Talks on phone: Not on file    Gets together: Not on file    Attends religious service: Not on file    Active member of club or organization: Not on file    Attends meetings of clubs or organizations: Not on file    Relationship status: Not on file  Other Topics Concern  . Not on file  Social History Narrative   Married, lives with spouse. Retired Licensed conveyancer, Print production planner. Has 3 kids (moved to [place] to be closer)-  youngest son MD. Dorie Rank to [place] from [place] 06/2010, lived in [place] x 10years    Family History  Problem Relation Age of Onset  . Hypertension Mother   . Alzheimer's disease Mother 40  . AAA (abdominal aortic aneurysm) Mother   . Stroke Father 78  . Kidney cancer Brother        mets  . Bone cancer Brother 11  . Colon cancer Neg Hx     Review of Systems  Constitutional: Negative  for chills and fever.  HENT: Negative for congestion, ear pain, sinus pressure and sinus pain.   Eyes: Positive for photophobia and redness. Negative for pain and visual disturbance.  Respiratory: Negative for cough and shortness of breath.   Cardiovascular: Negative for chest pain.  Gastrointestinal: Positive for abdominal pain (epigastric region - increased gas) and nausea.       Gerd controlled  Neurological: Positive for dizziness (felt off balanced a couple of times) and headaches. Negative for weakness and numbness.       Objective:   Vitals:   06/03/18 1042  BP: 138/88  Pulse: (!) 56  Resp: 16  Temp: 98 F (36.7 C)  SpO2: 97%   BP Readings from Last 3 Encounters:  06/03/18 138/88  05/07/18 (!) 142/72  01/11/18 122/70   Wt Readings from Last 3 Encounters:  06/03/18 142 lb (64.4 kg)  05/07/18 142 lb (64.4 kg)  01/11/18 149 lb 4 oz (67.7 kg)   Body mass index is 26.61 kg/m.   Physical Exam  Constitutional: She is oriented to person, place, and time. She appears well-developed and well-nourished. No distress.  HENT:  Head: Normocephalic and atraumatic.  Right Ear: External ear normal.  Left Ear: External ear normal.  Nose: Nose normal.  Mouth/Throat: Oropharynx is clear and moist. No oropharyngeal exudate.  Eyes: Scleral icterus (mild right eye with subconjunctival hemorrhage) is present.  Neck: Normal range of motion. No tracheal deviation present. No thyromegaly present.  Cardiovascular: Normal rate and regular rhythm.  Pulmonary/Chest: Effort normal and breath sounds normal. No  respiratory distress. She has no wheezes. She has no rales.  Abdominal: Soft. She exhibits no mass. There is tenderness (Mild in epigastric region, no other tenderness). There is no rebound and no guarding.  Musculoskeletal: She exhibits no edema.  Lymphadenopathy:    She has no cervical adenopathy.  Neurological: She is alert and oriented to person, place, and time. No cranial nerve deficit or sensory deficit.  Normal strength all extremities  Skin: Skin is warm and dry. She is not diaphoretic.           Assessment & Plan:    See Problem List for Assessment and Plan of chronic medical problems.

## 2018-06-07 DIAGNOSIS — M25561 Pain in right knee: Secondary | ICD-10-CM | POA: Diagnosis not present

## 2018-06-07 DIAGNOSIS — M25562 Pain in left knee: Secondary | ICD-10-CM | POA: Diagnosis not present

## 2018-07-20 DIAGNOSIS — H524 Presbyopia: Secondary | ICD-10-CM | POA: Diagnosis not present

## 2018-09-12 NOTE — Progress Notes (Signed)
Subjective:    Patient ID: [name] [name], female    DOB: [birth year], 72 y.o.   MRN: [id number]  HPI The patient is here for an acute visit.   Headaches: Her current headache started last night.  She describes it as a dull pain that goes from the top of her forehead to the top of her head.  She has had a few of these headaches the past couple of weeks.  Prior to that she was not experiencing regular headaches.  She has experience headaches like this in the past and they tend to come in clusters.  She also has occasional electric-like pain on the left side of her head that starts near her temple and radiates to the back of the head.  This pain lasts a second or 2 and then goes away.  These type of headaches started about 3 weeks ago.  She has had clusters of this type of headache as well in the past and they tend to just go away on their own.  Recently she had the inside of her left eye was very red and several days later the right eye was very red.  They looked bloodshot.  She denied any pain or changes in vision.  She is unsure if it was related to the headaches or not.  The redness in the right eye has almost completely resolved.  She denies any dizziness, lightheadedness, change in vision, fevers, chills and nausea.  She has had a decreased appetite.  She has been taking Fioricet for her headaches and this has been effective, but the past couple weeks it is not been helping these headaches.  She typically only takes 1 pill at a time.  She is also tried 1 Tylenol 650 mg dose and that helps a little.  The headache may go away, but recurs.  She is never been on any preventative headache medication.  She has never been on any other medication that she takes as needed.  She does have a very sensitive stomach and does not tolerate NSAIDs.  She states she has a history of a meningioma that was very small-last imaging was years ago.  Elevated blood pressure: She does monitor her blood pressure  occasionally at home.  She did take it on Saturday and it was normal and she did have a headache at that time.  She has not been taking her blood pressure at home consistently.  GERD:  She is taking her medication daily as prescribed.  She denies any GERD symptoms and feels her GERD is well controlled.    Medications and allergies reviewed with patient and updated if appropriate.  Patient Active Problem List   Diagnosis Date Noted  . Headache behind the eyes 06/03/2018  . Subconjunctival hemorrhage of right eye 06/03/2018  . Skin abnormalities 05/07/2018  . Left lower quadrant pain 01/04/2018  . Bilateral shoulder pain 12/11/2017  . Gastritis 12/11/2017  . Trigger finger, right little finger 09/11/2017  . Numbness and tingling in left hand 07/20/2017  . Dupuytren's contracture of hand 07/20/2017  . CTS (carpal tunnel syndrome) 07/17/2017  . Bilateral carpal tunnel syndrome 07/17/2017  . Arthralgia 06/30/2017  . Paresthesia 06/10/2017  . Cramping of hands 06/10/2017  . Leg cramps 06/10/2017  . 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 Chronic midline low back pain without sciatica 09/15/2016  . Osteopenia 07/20/2016  . Bilateral finger numbness 06/03/2016  . Joint pain 06/03/2016  . Atypical chest pain 02/09/2015  .  Hyperlipidemia 02/09/2015  . Bilateral primary osteoarthritis of knee 10/03/2013  . Lateral meniscal tear 10/03/2013  . 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 Varicose vein of leg   . Allergic rhinitis   . IBS (irritable bowel syndrome) 04/07/2011  . Migraine headache 09/27/2010  . Essential hypertension 09/27/2010  . GERD 09/27/2010  . RENAL CALCULUS, HX OF 09/27/2010    Current Outpatient Medications on File Prior to Visit  Medication Sig Dispense Refill  . aspirin 81 MG tablet Take 81 mg by mouth 3 (three) times a week. Monday, Wednesday, and Fridays    . atorvastatin (LIPITOR) 40 MG tablet Take 0.5 tablets (20 mg total) by mouth daily. 45  tablet 1  . butalbital-acetaminophen-caffeine (FIORICET, ESGIC) 50-325-40 MG tablet Take 1 tablet by mouth every 4 (four) hours as needed for headache. 30 tablet 0  . calcium carbonate (OS-CAL) 600 MG tablet Take 1 tablet (600 mg total) by mouth daily. 60 tablet   . Cholecalciferol (VITAMIN D3) 1000 units CAPS Take 1,000 Units by mouth 3 (three) times a week.     . cloNIDine (CATAPRES) 0.1 MG tablet TAKE ONE TABLET BY MOUTH ONCE DAILY AS NEEDED FOR BP OVER  160/90, REPEAT IN ! HOUR IF NEEDED -- Office visit needed for further refills 30 tablet 0  . Dexlansoprazole (DEXILANT) 30 MG capsule Take 1 capsule (30 mg total) by mouth daily. 30 capsule 1  . dicyclomine (BENTYL) 10 MG capsule Take 1 capsule (10 mg total) by mouth every 8 (eight) hours as needed for spasms. 30 capsule 1  . losartan (COZAAR) 25 MG tablet Take 1 tablet (25 mg total) by mouth daily. 90 tablet 2  . metoprolol succinate (TOPROL-XL) 50 MG 24 hr tablet Take 1 tablet (50 mg total) by mouth daily. 90 tablet 3   No current facility-administered medications on file prior to visit.     Past Medical History:  Diagnosis Date  . Arthritis   . Atypical chest pain   . Diverticulosis   . Gastritis 11/08/2007, 04/12/2012   H pylori bx neg 03/2012  . GERD (gastroesophageal reflux disease) 11/08/2007, 04/12/2012  . Hepatic cyst   . Hiatal hernia 11/08/2007, 04/12/2012  . History of kidney stones   . Hyperlipidemia   . Hypertension   . IBS (irritable bowel syndrome)    diarrhea predom  . Migraine   . Osteoporosis 09/27/2010   DEXA 01/21/2012: -2.1 spine and R fem, -1.8 L fem s/p fosamax  2004-2011 DEXA 04/18/14 @ [facility]: -2.5 - rec to start Prolia    . PVC's (premature ventricular contractions)   . 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 Shingles outbreak 04/2013    Past Surgical History:  Procedure Laterality Date  . APPENDECTOMY  1958  . CYSTOCELE REPAIR  2008  . Maxilofacial  1992  . TONSILLECTOMY  1960     Social History   Socioeconomic History  . Marital status: Married    Spouse name: Not on file  . Number of children: 2  . Years of education: Not on file  . Highest education level: Not on file  Occupational History  . Occupation: retired  Scientific laboratory technician  . Financial resource strain: Not on file  . Food insecurity:    Worry: Not on file    Inability: Not on file  . Transportation needs:    Medical: Not on file    Non-medical: Not on file  Tobacco Use  . Smoking status: Never Smoker  .  Smokeless tobacco: Never Used  Substance and Sexual Activity  . Alcohol use: No  . Drug use: No  . Sexual activity: Not on file  Lifestyle  . Physical activity:    Days per week: Not on file    Minutes per session: Not on file  . Stress: Not on file  Relationships  . Social connections:    Talks on phone: Not on file    Gets together: Not on file    Attends religious service: Not on file    Active member of club or organization: Not on file    Attends meetings of clubs or organizations: Not on file    Relationship status: Not on file  Other Topics Concern  . Not on file  Social History Narrative   Married, lives with spouse. Retired Licensed conveyancer, Print production planner. Has 3 kids (moved to [place] to be closer)- youngest son MD. Dorie Rank to [place] from [place] 06/2010, lived in [place] x 10years    Family History  Problem Relation Age of Onset  . Hypertension Mother   . Alzheimer's disease Mother 2  . AAA (abdominal aortic aneurysm) Mother   . Stroke Father 1  . Kidney cancer Brother        mets  . Bone cancer Brother 25  . Colon cancer Neg Hx     Review of Systems  Constitutional: Positive for appetite change (decreased). Negative for chills and fever.  Eyes: Negative for visual disturbance.  Cardiovascular: Negative for chest pain and palpitations.  Gastrointestinal: Negative for nausea.  Neurological: Positive for numbness (left fingers) and headaches. Negative for dizziness and  light-headedness.       Objective:   Vitals:   09/13/18 0959  BP: (!) 172/80  Pulse: 75  Resp: 16  Temp: 98.5 F (36.9 C)  SpO2: 97%   BP Readings from Last 3 Encounters:  09/13/18 (!) 172/80  06/03/18 138/88  05/07/18 (!) 142/72   Wt Readings from Last 3 Encounters:  09/13/18 146 lb (66.2 kg)  06/03/18 142 lb (64.4 kg)  05/07/18 142 lb (64.4 kg)   Body mass index is 27.36 kg/m.   Physical Exam  Constitutional: She appears well-developed and well-nourished.  Non-toxic appearance. She does not appear ill. No distress.  HENT:  Head: Normocephalic and atraumatic.  Scalp is not painful or sensitive to touch  Neck: Neck supple. No neck rigidity. No tracheal deviation present.  Cardiovascular: Normal rate and regular rhythm.  Pulmonary/Chest: Effort normal and breath sounds normal.  Musculoskeletal: She exhibits no edema or tenderness (Cervical spine, upper back).  Lymphadenopathy:    She has no cervical adenopathy.  Neurological: She is alert. She has normal strength. No cranial nerve deficit or sensory deficit. Gait normal.  Skin: Skin is warm and dry.  Psychiatric: She has a normal mood and affect.           Assessment & Plan:    See Problem List for Assessment and Plan of chronic medical problems.

## 2018-09-13 ENCOUNTER — Encounter: Payer: Self-pay | Admitting: Internal Medicine

## 2018-09-13 ENCOUNTER — Ambulatory Visit (INDEPENDENT_AMBULATORY_CARE_PROVIDER_SITE_OTHER): Payer: PPO | Admitting: Internal Medicine

## 2018-09-13 VITALS — BP 172/80 | HR 75 | Temp 98.5°F | Resp 16 | Ht 61.25 in | Wt 146.0 lb

## 2018-09-13 DIAGNOSIS — R51 Headache: Secondary | ICD-10-CM | POA: Diagnosis not present

## 2018-09-13 DIAGNOSIS — K219 Gastro-esophageal reflux disease without esophagitis: Secondary | ICD-10-CM | POA: Diagnosis not present

## 2018-09-13 DIAGNOSIS — I10 Essential (primary) hypertension: Secondary | ICD-10-CM

## 2018-09-13 DIAGNOSIS — R519 Headache, unspecified: Secondary | ICD-10-CM

## 2018-09-13 NOTE — Assessment & Plan Note (Signed)
GERD controlled Continue daily medication Advised her to contact me if her GERD symptoms increase he has increased caffeine from Fioricet

## 2018-09-13 NOTE — Assessment & Plan Note (Signed)
She is experiencing intermittent clusters of headaches-she has a dull headache that extends from her forehead to the top of her head and an electric-like pain on the left side of her head that lasts a second Has been taking one Fioricet or one Tylenol without much relief Blood pressure is elevated, but she has had normal blood pressure readings when she has had headaches so that is unlikely the cause History of migraines Given her elevated blood pressure and do not want to start her on a triptan and because her headaches only occur in clusters we both feel preventative medication is not necessary She should not take many NSAIDs due to history of gastritis/GERD Advised her to try two Fioricet when she gets home today-think she needs a higher dose than what she was taking Will refer to neurology for further evaluation and treatment/possible imaging

## 2018-09-13 NOTE — Patient Instructions (Addendum)
Take two of the fioricet today to see if it helps with the headache.     A referral was ordered for neurology - [facility].    Monitor your BP every day and record your readings.

## 2018-09-13 NOTE — Assessment & Plan Note (Signed)
Blood pressure elevated here today, but it could be related to her head pain She does not monitor her blood pressure at home regularly and I have advised her to take her blood pressure daily for the next 2 weeks and let me know what her readings are Most likely she does need an increase in medication-we will adjust after reviewing her blood pressure from home

## 2018-09-14 ENCOUNTER — Ambulatory Visit: Payer: PPO | Admitting: Internal Medicine

## 2018-10-15 ENCOUNTER — Other Ambulatory Visit: Payer: Self-pay | Admitting: Internal Medicine

## 2018-10-18 ENCOUNTER — Other Ambulatory Visit: Payer: Self-pay | Admitting: Internal Medicine

## 2018-10-25 ENCOUNTER — Other Ambulatory Visit: Payer: Self-pay | Admitting: Internal Medicine

## 2018-10-25 ENCOUNTER — Encounter: Payer: Self-pay | Admitting: Internal Medicine

## 2018-10-26 NOTE — Progress Notes (Signed)
Subjective:    Patient ID: [name] [name], female    DOB: Oct 29, 1946, 72 y.o.   MRN: [id number]  HPI The patient is here for an acute visit.   Finger and joint inflammation: She is worsening joint pain and wonders if she should see a rheumatologist.  She has pain in her shoulder, ankle, knees and hands.  She has had swelling of some of her joints, especially her knees and fingers.  She is not currently taking anything for generalized arthritis.  She does use a cream on her knees, which helps.  She also wears knee braces.  Hypertension: She is taking her medication daily. She is compliant with a low sodium diet.  She has occasional palpitations.  She denies chest pain, edema, shortness of breath and regular headaches. She is not exercising regularly.  She does check her blood pressure at home at times and it is variable-sometimes in the 150s.  GERD: She was taking medication daily, but stopped a little while ago and did well initially.  2 weeks ago she started having GERD symptoms and she restarted medication and it is helping.  She needs a refill of her medication.  Headaches, migraines: She continues to have frequent headaches.  She has been referred to neurology, but would like to see Dr. Tomi Likens.  Medications and allergies reviewed with patient and updated if appropriate.  Patient Active Problem List   Diagnosis Date Noted  . Headache behind the eyes 06/03/2018  . Subconjunctival hemorrhage of right eye 06/03/2018  . Skin abnormalities 05/07/2018  . Left lower quadrant pain 01/04/2018  . Bilateral shoulder pain 12/11/2017  . Gastritis 12/11/2017  . Trigger finger, right little finger 09/11/2017  . Numbness and tingling in left hand 07/20/2017  . Dupuytren's contracture of hand 07/20/2017  . CTS (carpal tunnel syndrome) 07/17/2017  . Bilateral carpal tunnel syndrome 07/17/2017  . Arthralgia 06/30/2017  . Paresthesia 06/10/2017  . Cramping of hands 06/10/2017  . Leg cramps  06/10/2017  . 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 Chronic midline low back pain without sciatica 09/15/2016  . Osteopenia 07/20/2016  . Bilateral finger numbness 06/03/2016  . Joint pain 06/03/2016  . Atypical chest pain 02/09/2015  . Hyperlipidemia 02/09/2015  . Bilateral primary osteoarthritis of knee 10/03/2013  . Lateral meniscal tear 10/03/2013  . 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 Varicose vein of leg   . Allergic rhinitis   . IBS (irritable bowel syndrome) 04/07/2011  . Migraine headache 09/27/2010  . Essential hypertension 09/27/2010  . GERD 09/27/2010  . RENAL CALCULUS, HX OF 09/27/2010    Current Outpatient Medications on File Prior to Visit  Medication Sig Dispense Refill  . aspirin 81 MG tablet Take 81 mg by mouth 3 (three) times a week. Monday, Wednesday, and Fridays    . atorvastatin (LIPITOR) 40 MG tablet Take 0.5 tablets (20 mg total) by mouth daily. 45 tablet 1  . butalbital-acetaminophen-caffeine (FIORICET, ESGIC) 50-325-40 MG tablet TAKE ONE TABLET BY MOUTH EVERY 4 HOURS AS NEEDED FOR HEADACHE 30 tablet 0  . calcium carbonate (OS-CAL) 600 MG tablet Take 1 tablet (600 mg total) by mouth daily. 60 tablet   . Cholecalciferol (VITAMIN D3) 1000 units CAPS Take 1,000 Units by mouth 3 (three) times a week.     . cloNIDine (CATAPRES) 0.1 MG tablet TAKE ONE TABLET BY MOUTH ONCE DAILY AS NEEDED FOR BP OVER  160/90, REPEAT IN ! HOUR IF NEEDED -- Office visit needed for further refills  30 tablet 0  . dicyclomine (BENTYL) 10 MG capsule Take 1 capsule (10 mg total) by mouth every 8 (eight) hours as needed for spasms. 30 capsule 1  . losartan (COZAAR) 25 MG tablet Take 1 tablet (25 mg total) by mouth daily. 90 tablet 2  . metoprolol succinate (TOPROL-XL) 50 MG 24 hr tablet Take 1 tablet (50 mg total) by mouth daily. 90 tablet 3   No current facility-administered medications on file prior to visit.     Past Medical History:  Diagnosis Date  . Arthritis     . Atypical chest pain   . Diverticulosis   . Gastritis 11/08/2007, 04/12/2012   H pylori bx neg 03/2012  . GERD (gastroesophageal reflux disease) 11/08/2007, 04/12/2012  . Hepatic cyst   . Hiatal hernia 11/08/2007, 04/12/2012  . History of kidney stones   . Hyperlipidemia   . Hypertension   . IBS (irritable bowel syndrome)    diarrhea predom  . Migraine   . Osteoporosis 09/27/2010   DEXA 01/21/2012: -2.1 spine and R fem, -1.8 L fem s/p fosamax  2004-2011 DEXA 04/18/14 @ [facility]: -2.5 - rec to start Prolia    . PVC's (premature ventricular contractions)   . 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 Shingles outbreak 04/2013    Past Surgical History:  Procedure Laterality Date  . APPENDECTOMY  1958  . CYSTOCELE REPAIR  2008  . Maxilofacial  1992  . TONSILLECTOMY  1960    Social History   Socioeconomic History  . Marital status: Married    Spouse name: Not on file  . Number of children: 2  . Years of education: Not on file  . Highest education level: Not on file  Occupational History  . Occupation: retired  Scientific laboratory technician  . Financial resource strain: Not on file  . Food insecurity:    Worry: Not on file    Inability: Not on file  . Transportation needs:    Medical: Not on file    Non-medical: Not on file  Tobacco Use  . Smoking status: Never Smoker  . Smokeless tobacco: Never Used  Substance and Sexual Activity  . Alcohol use: No  . Drug use: No  . Sexual activity: Not on file  Lifestyle  . Physical activity:    Days per week: Not on file    Minutes per session: Not on file  . Stress: Not on file  Relationships  . Social connections:    Talks on phone: Not on file    Gets together: Not on file    Attends religious service: Not on file    Active member of club or organization: Not on file    Attends meetings of clubs or organizations: Not on file    Relationship status: Not on file  Other Topics Concern  . Not on file  Social History  Narrative   Married, lives with spouse. Retired Licensed conveyancer, Print production planner. Has 3 kids (moved to [place] to be closer)- youngest son MD. Dorie Rank to [place] from [place] 06/2010, lived in [place] x 10years    Family History  Problem Relation Age of Onset  . Hypertension Mother   . Alzheimer's disease Mother 44  . AAA (abdominal aortic aneurysm) Mother   . Stroke Father 83  . Kidney cancer Brother        mets  . Bone cancer Brother 44  . Colon cancer Neg Hx     Review of Systems  Constitutional: Negative  for chills and fever.  Respiratory: Negative for cough, shortness of breath and wheezing.   Cardiovascular: Positive for palpitations (occ, transient). Negative for chest pain and leg swelling.  Musculoskeletal: Positive for arthralgias and joint swelling.  Neurological: Positive for headaches. Negative for dizziness and light-headedness.       Objective:   Vitals:   10/27/18 0940  BP: (!) 144/88  Pulse: 68  Resp: 16  Temp: 98.3 F (36.8 C)  SpO2: 98%   BP Readings from Last 3 Encounters:  10/27/18 (!) 144/88  09/13/18 (!) 172/80  06/03/18 138/88   Wt Readings from Last 3 Encounters:  10/27/18 147 lb (66.7 kg)  09/13/18 146 lb (66.2 kg)  06/03/18 142 lb (64.4 kg)   Body mass index is 27.55 kg/m.   Physical Exam    Constitutional: Appears well-developed and well-nourished. No distress.  HENT:  Head: Normocephalic and atraumatic.  Neck: Neck supple. No tracheal deviation present. No thyromegaly present.  No cervical lymphadenopathy Cardiovascular: Normal rate, regular rhythm and normal heart sounds.   No murmur heard. No carotid bruit .  No edema Pulmonary/Chest: Effort normal and breath sounds normal. No respiratory distress. No has no wheezes. No rales.  Msk: Deformity of right pinky finger from what looks like osteoarthritis, no other joint deformities Skin: Skin is warm and dry. Not diaphoretic.  Psychiatric: Normal mood and affect. Behavior is normal.        Assessment & Plan:    See Problem List for Assessment and Plan of chronic medical problems.

## 2018-10-27 ENCOUNTER — Other Ambulatory Visit (INDEPENDENT_AMBULATORY_CARE_PROVIDER_SITE_OTHER): Payer: PPO

## 2018-10-27 ENCOUNTER — Encounter: Payer: Self-pay | Admitting: Internal Medicine

## 2018-10-27 ENCOUNTER — Ambulatory Visit (INDEPENDENT_AMBULATORY_CARE_PROVIDER_SITE_OTHER): Payer: PPO | Admitting: Internal Medicine

## 2018-10-27 ENCOUNTER — Encounter: Payer: Self-pay | Admitting: Neurology

## 2018-10-27 VITALS — BP 144/88 | HR 68 | Temp 98.3°F | Resp 16 | Ht 61.25 in | Wt 147.0 lb

## 2018-10-27 DIAGNOSIS — I10 Essential (primary) hypertension: Secondary | ICD-10-CM

## 2018-10-27 DIAGNOSIS — M2550 Pain in unspecified joint: Secondary | ICD-10-CM

## 2018-10-27 DIAGNOSIS — K219 Gastro-esophageal reflux disease without esophagitis: Secondary | ICD-10-CM

## 2018-10-27 DIAGNOSIS — R51 Headache: Secondary | ICD-10-CM

## 2018-10-27 DIAGNOSIS — G43809 Other migraine, not intractable, without status migrainosus: Secondary | ICD-10-CM

## 2018-10-27 DIAGNOSIS — R519 Headache, unspecified: Secondary | ICD-10-CM

## 2018-10-27 DIAGNOSIS — E782 Mixed hyperlipidemia: Secondary | ICD-10-CM

## 2018-10-27 LAB — COMPREHENSIVE METABOLIC PANEL
ALK PHOS: 56 U/L (ref 39–117)
ALT: 18 U/L (ref 0–35)
AST: 19 U/L (ref 0–37)
Albumin: 4.3 g/dL (ref 3.5–5.2)
BUN: 17 mg/dL (ref 6–23)
CO2: 25 mEq/L (ref 19–32)
Calcium: 10.1 mg/dL (ref 8.4–10.5)
Chloride: 106 mEq/L (ref 96–112)
Creatinine, Ser: 0.78 mg/dL (ref 0.40–1.20)
GFR: 76.86 mL/min (ref >60.00)
GLUCOSE: 99 mg/dL (ref 70–99)
Potassium: 4.2 mEq/L (ref 3.5–5.1)
Sodium: 140 mEq/L (ref 135–145)
Total Bilirubin: 0.5 mg/dL (ref 0.2–1.2)
Total Protein: 7.2 g/dL (ref 6.0–8.3)

## 2018-10-27 LAB — CBC WITH DIFFERENTIAL/PLATELET
Basophils Absolute: 0.1 10*3/uL (ref 0.0–0.1)
Basophils Relative: 0.9 % (ref 0.0–3.0)
Eosinophils Absolute: 0.2 10*3/uL (ref 0.0–0.7)
Eosinophils Relative: 2.6 % (ref 0.0–5.0)
HCT: 42.2 % (ref 36.0–46.0)
Hemoglobin: 14.3 g/dL (ref 12.0–15.0)
Lymphocytes Relative: 26.3 % (ref 12.0–46.0)
Lymphs Abs: 2.1 10*3/uL (ref 0.7–4.0)
MCHC: 33.9 g/dL (ref 30.0–36.0)
MCV: 96.6 fl (ref 78.0–100.0)
Monocytes Absolute: 0.8 10*3/uL (ref 0.1–1.0)
Monocytes Relative: 9.4 % (ref 3.0–12.0)
Neutro Abs: 4.9 10*3/uL (ref 1.4–7.7)
Neutrophils Relative %: 60.8 % (ref 43.0–77.0)
Platelets: 233 10*3/uL (ref 150.0–400.0)
RBC: 4.36 Mil/uL (ref 3.87–5.11)
RDW: 13.5 % (ref 11.5–15.5)
WBC: 8 10*3/uL (ref 4.0–10.5)

## 2018-10-27 LAB — HEMOGLOBIN A1C: Hgb A1c MFr Bld: 5.7 % (ref 4.6–6.5)

## 2018-10-27 LAB — LIPID PANEL
CHOLESTEROL: 181 mg/dL (ref 0–200)
HDL: 50.1 mg/dL (ref >39.00)
LDL Cholesterol: 98 mg/dL (ref 0–99)
NonHDL: 130.48
Total CHOL/HDL Ratio: 4
Triglycerides: 164 mg/dL — ABNORMAL HIGH (ref 0.0–149.0)
VLDL: 32.8 mg/dL (ref 0.0–40.0)

## 2018-10-27 LAB — SEDIMENTATION RATE: Sed Rate: 17 mm/hr (ref 0–30)

## 2018-10-27 LAB — C-REACTIVE PROTEIN: CRP: 0.2 mg/dL — ABNORMAL LOW (ref 0.5–20.0)

## 2018-10-27 MED ORDER — LOSARTAN POTASSIUM 50 MG PO TABS: 50.0000 mg | ORAL_TABLET | Freq: Every day | ORAL | 3 refills | Status: DC

## 2018-10-27 MED ORDER — PANTOPRAZOLE SODIUM 40 MG PO TBEC: 40.0000 mg | DELAYED_RELEASE_TABLET | Freq: Every day | ORAL | 3 refills | Status: DC

## 2018-10-27 NOTE — Assessment & Plan Note (Signed)
She is still experiencing frequent headaches and does have a history of migraines, which she has on occasion Not taking any NSAIDs or Tylenol on a regular basis Refer to neurology for further evaluation Continue Fioricet as needed

## 2018-10-27 NOTE — Assessment & Plan Note (Signed)
She does require daily PPI to keep her symptoms controlled-did stop taking medication and GERD symptoms started She has been taking pantoprazole and it has been effective Refill pantoprazole 40 mg daily

## 2018-10-27 NOTE — Patient Instructions (Addendum)
Start taking Tylenol 1000 mg 2-3 times a day for your joint pain.    Increase losartan to 50 mg daily.    Tests ordered today. Your results will be released to [facility] (or called to you) after review, usually within 72hours after test completion. If any changes need to be made, you will be notified at that same time.    Your prescription(s) have been submitted to your pharmacy. Please take as directed and contact our office if you believe you are having problem(s) with the medication(s).  A referral was ordered for Dr Tomi Likens, Neurology.   Please followup in 6 months

## 2018-10-27 NOTE — Assessment & Plan Note (Signed)
Blood pressure not ideally controlled Continue metoprolol at current dose Increase losartan to 50 mg daily CMP

## 2018-10-27 NOTE — Assessment & Plan Note (Signed)
Has some migraines, but also having chronic headaches Taking Fioricet as needed, can continue We will see neurology for further evaluation of headaches

## 2018-10-27 NOTE — Assessment & Plan Note (Signed)
She is not fasting, but since she is getting blood work will check lipid panel  Continue daily statin Regular exercise and healthy diet encouraged

## 2018-10-27 NOTE — Assessment & Plan Note (Signed)
Having diffuse pain-likely osteoarthritis We will check blood work to rule out autoimmune cause Has seen sports medicine for some of her joint pain Not currently taking anything orally, but uses an anti-inflammatory topical medication, which she can use on all of her joints Advised Tylenol 1000 mg 2-3 times a day

## 2018-10-29 LAB — ANA: Anti Nuclear Antibody(ANA): POSITIVE — AB

## 2018-10-29 LAB — ANTI-NUCLEAR AB-TITER (ANA TITER): ANA Titer 1: 1:40 {titer} — ABNORMAL HIGH

## 2018-10-29 LAB — RHEUMATOID FACTOR

## 2018-10-31 ENCOUNTER — Encounter: Payer: Self-pay | Admitting: Internal Medicine

## 2019-01-18 NOTE — Progress Notes (Signed)
NEUROLOGY CONSULTATION NOTE  [name] [name] MRN: [id number] DOB: [birth year]  Referring provider: Billey Gosling, MD Primary care provider: Billey Gosling, MD  Reason for consult: Headache  HISTORY OF PRESENT ILLNESS: [name] [name] is a 73 year old right-handed woman with hypertension, migraines and carpal tunnel syndrome who presents for headaches.  History supplemented by referring provider and prior neurologist notes.  Onset:  She has history of migraines for many years.  She presents for this different headache which began in her 22s. Location:  Left greater than right temple Quality:  Electric shock Intensity:  10/10.  She denies new headache, thunderclap headache or severe headache that wakes her from sleep. Associated symptoms:  Usually no associated symptoms.  Sometimes there is associated conjunctival injection on side of pain.  Photophobia as well.  No associated neck pain.  She denies associated phonophobia, osmophobia, nausea, vomiting, visual disturbance or unilateral numbness or weakness. Duration:  5 seconds Frequency:  2 to 3 times a day and it may occur again in 2 to 4 weeks. 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  Current analgesics:  Tylenol, Fioricet  Current Antihypertensive medications:  Toprol-XL, losartan  Past NSAIDS:  ibuprofen  Past analgesics:  tramadol Past antihypertensive medications:  clonidine Past antidepressant medications:  none Past anticonvulsant medications:  gabapentin  Caffeine:  1 cup coffee daily.  Rarely tea or soda. Diet:  She tries drinking plenty of water during the day.  She does not skip meals Depression:  no; Anxiety:  no Other pain:  Knee pain Sleep hygiene:  Wakes up frequently during the night Family history of headache:  No  10/27/18 LABS: CBC with WBC 8, H GB  14.3, HCT 42.2, PLT 233; CMP with NA 140, K4.2, CL 106, CO2 25, glucose 99, BUN 17, CR 0.78, T bili 0.5, ALP 56, AST 19, ALT 18; ANA with titer of 1:40, sed rate 17, CRP 0.2.  PAST MEDICAL HISTORY: Past Medical History:  Diagnosis Date  . Arthritis   . Atypical chest pain   . Diverticulosis   . Gastritis 11/08/2007, 04/12/2012   H pylori bx neg 03/2012  . GERD (gastroesophageal reflux disease) 11/08/2007, 04/12/2012  . Hepatic cyst   . Hiatal hernia 11/08/2007, 04/12/2012  . History of kidney stones   . Hyperlipidemia   . Hypertension   . IBS (irritable bowel syndrome)    diarrhea predom  . Migraine   . Osteoporosis 09/27/2010   DEXA 01/21/2012: -2.1 spine and R fem, -1.8 L fem s/p fosamax  2004-2011 DEXA 04/18/14 @ [facility]: -2.5 - rec to start Prolia    . PVC's (premature ventricular contractions)   . 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 Shingles outbreak 04/2013    PAST SURGICAL HISTORY: Past Surgical History:  Procedure Laterality Date  . APPENDECTOMY  1958  . CYSTOCELE REPAIR  2008  . Maxilofacial  1992  . TONSILLECTOMY  1960    MEDICATIONS: Current Outpatient Medications on File Prior to Visit  Medication Sig Dispense Refill  . atorvastatin (LIPITOR) 40 MG tablet Take 0.5 tablets (20 mg total) by mouth daily. 45 tablet 1  . butalbital-acetaminophen-caffeine (FIORICET, ESGIC) 50-325-40 MG tablet TAKE ONE TABLET BY MOUTH EVERY 4 HOURS AS NEEDED FOR HEADACHE 30 tablet 0  . calcium carbonate (OS-CAL) 600 MG tablet Take 1 tablet (600 mg total) by mouth daily. 60 tablet   .  Cholecalciferol (VITAMIN D3) 1000 units CAPS Take 1,000 Units by mouth 3 (three) times a week.     . cloNIDine (CATAPRES) 0.1 MG tablet TAKE ONE TABLET BY MOUTH ONCE DAILY AS NEEDED FOR BP OVER  160/90, REPEAT IN ! HOUR IF NEEDED -- Office visit needed for further refills 30 tablet 0  . dicyclomine (BENTYL) 10 MG capsule Take 1 capsule (10 mg total) by mouth every 8 (eight) hours as  needed for spasms. 30 capsule 1  . losartan (COZAAR) 50 MG tablet Take 1 tablet (50 mg total) by mouth daily. 90 tablet 3  . metoprolol succinate (TOPROL-XL) 50 MG 24 hr tablet Take 1 tablet (50 mg total) by mouth daily. 90 tablet 3  . pantoprazole (PROTONIX) 40 MG tablet Take 1 tablet (40 mg total) by mouth daily. 90 tablet 3   No current facility-administered medications on file prior to visit.     ALLERGIES: No Known Allergies  FAMILY HISTORY: Family History  Problem Relation Age of Onset  . Hypertension Mother   . Alzheimer's disease Mother 80  . AAA (abdominal aortic aneurysm) Mother   . Stroke Father 48  . Kidney cancer Brother        mets  . Bone cancer Brother 52  . Colon cancer Neg Hx    SOCIAL HISTORY: Social History   Socioeconomic History  . Marital status: Married    Spouse name: Not on file  . Number of children: 2  . Years of education: Not on file  . Highest education level: Not on file  Occupational History  . Occupation: retired  Scientific laboratory technician  . Financial resource strain: Not on file  . Food insecurity:    Worry: Not on file    Inability: Not on file  . Transportation needs:    Medical: Not on file    Non-medical: Not on file  Tobacco Use  . Smoking status: Never Smoker  . Smokeless tobacco: Never Used  Substance and Sexual Activity  . Alcohol use: No  . Drug use: No  . Sexual activity: Not on file  Lifestyle  . Physical activity:    Days per week: Not on file    Minutes per session: Not on file  . Stress: Not on file  Relationships  . Social connections:    Talks on phone: Not on file    Gets together: Not on file    Attends religious service: Not on file    Active member of club or organization: Not on file    Attends meetings of clubs or organizations: Not on file    Relationship status: Not on file  . Intimate partner violence:    Fear of current or ex partner: Not on file    Emotionally abused: Not on file    Physically abused:  Not on file    Forced sexual activity: Not on file  Other Topics Concern  . Not on file  Social History Narrative   Married, lives with spouse. Retired Licensed conveyancer, Print production planner. Has 3 kids (moved to [place] to be closer)- youngest son MD. Dorie Rank to [place] from [place] 06/2010, lived in [place] x 10years    REVIEW OF SYSTEMS: Constitutional: No fevers, chills, or sweats, no generalized fatigue, change in appetite Eyes: No visual changes, double vision, eye pain Ear, nose and throat: No hearing loss, ear pain, nasal congestion, sore throat Cardiovascular: No chest pain, palpitations Respiratory:  No shortness of breath at rest or with exertion, wheezes GastrointestinaI: No  nausea, vomiting, diarrhea, abdominal pain, fecal incontinence Genitourinary:  No dysuria, urinary retention or frequency Musculoskeletal:  No neck pain, back pain Integumentary: No rash, pruritus, skin lesions Neurological: as above Psychiatric: No depression, insomnia, anxiety Endocrine: No palpitations, fatigue, diaphoresis, mood swings, change in appetite, change in weight, increased thirst Hematologic/Lymphatic:  No purpura, petechiae. Allergic/Immunologic: no itchy/runny eyes, nasal congestion, recent allergic reactions, rashes  PHYSICAL EXAM: Blood pressure 108/68, pulse 64, height 5\' 3"  (1.6 m), weight 145 lb (65.8 kg), SpO2 97 %. General: No acute distress.  Patient appears well-groomed.   Head:  Normocephalic/atraumatic.  Some tenderness to palpation of TMJ (left greater than right) Eyes:  fundi examined but not visualized Neck: supple, no paraspinal tenderness, full range of motion Back: No paraspinal tenderness Heart: regular rate and rhythm Lungs: Clear to auscultation bilaterally. Vascular: No carotid bruits. Neurological Exam: Mental status: alert and oriented to person, place, and time, recent and remote memory intact, fund of knowledge intact, attention and concentration intact, speech fluent and not  dysarthric, language intact. Cranial nerves: CN I: not tested CN II: pupils equal, round and reactive to light, visual fields intact CN III, IV, VI:  full range of motion, no nystagmus, no ptosis CN V: facial sensation intact CN VII: upper and lower face symmetric CN VIII: hearing intact CN IX, X: gag intact, uvula midline CN XI: sternocleidomastoid and trapezius muscles intact CN XII: tongue midline Bulk & Tone: normal, no fasciculations. Motor:  5/5 throughout Sensation: temperature and vibration sensation intact.  Deep Tendon Reflexes:  2+ throughout, toes downgoing.   Finger to nose testing:  Without dysmetria.   Heel to shin:  Without dysmetria.   Gait:  Normal station and stride.  Romberg negative.  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    PLAN: As it is so brief and infrequent, I wouldn't start any new daily preventative medication.  She should treat as needed with Tylenol or NSAID.  If they become more frequent, then she should make a follow up appointment to discuss preventative medication.  45 minutes spent face to face with patient, over 50% spent discussing diagnosis and management.  Thank you for allowing me to take part in the care of this patient.  Metta Clines, DO  CC: Billey Gosling, MD

## 2019-01-20 ENCOUNTER — Encounter: Payer: Self-pay | Admitting: Neurology

## 2019-01-20 ENCOUNTER — Ambulatory Visit (INDEPENDENT_AMBULATORY_CARE_PROVIDER_SITE_OTHER): Payer: PPO | Admitting: Neurology

## 2019-01-20 VITALS — BP 108/68 | HR 64 | Ht 63.0 in | Wt 145.0 lb

## 2019-01-20 DIAGNOSIS — M792 Neuralgia and neuritis, unspecified: Secondary | ICD-10-CM | POA: Diagnosis not present

## 2019-01-20 NOTE — Patient Instructions (Signed)
The head pain may be irritation of the nerves near the jaw.  If it remains infrequent and brief, I would just treat as needed with Tylenol or ibuprofen.  If it becomes more frequent that may require a daily medication, make a follow up appointment with me.

## 2019-01-26 ENCOUNTER — Institutional Professional Consult (permissible substitution): Payer: PPO | Admitting: Neurology

## 2019-02-06 ENCOUNTER — Other Ambulatory Visit: Payer: Self-pay | Admitting: Gastroenterology

## 2019-02-07 ENCOUNTER — Telehealth: Payer: Self-pay | Admitting: Internal Medicine

## 2019-02-07 DIAGNOSIS — M8589 Other specified disorders of bone density and structure, multiple sites: Secondary | ICD-10-CM

## 2019-02-07 NOTE — Telephone Encounter (Signed)
dexa ordered - please schedule

## 2019-02-07 NOTE — Telephone Encounter (Signed)
Spoke to patient and because she was driving she will call back and schedule

## 2019-02-07 NOTE — Progress Notes (Signed)
Subjective:    Patient ID: [name] [name], female    DOB: 05-23-1946, 73 y.o.   MRN: [id number]  HPI The patient is here for follow up.  Osteopenia:  She is due for a dexa scan, which she had today.  She did recently have a bone density scan done in [place] and was shown to have osteoporosis.  They were recommending that she do a week last injection once a year.  She is willing to consider treatment, but would like to have it done here.  She is interested more in the Prolia injection than the Reclast.  She is taking calcium and vitamin d daily.  She is exercising twice a week - walking for 30 weeks.    Stomach pain: She has been experiencing stomach pain for the past 2 weeks in the epigastric region.  The pain has been getting better, but is still there.  The dicyclomine has helped and she does take that as needed.  She does need a new refill and it looks like GI send that in.  She does take the pantoprazole as prescribed and she denies any GERD symptoms.  4 days ago she also had 6 bowel movements that were very soft, which is unusual for her.  Lower back pain;  Sometimes hurts with walking.  sittting down helps. Bending over hurts.  No pain in legs.  Has not seen anyone.  Tylenol does help.    Medications and allergies reviewed with patient and updated if appropriate.  Patient Active Problem List   Diagnosis Date Noted  . Headache behind the eyes 06/03/2018  . Skin abnormalities 05/07/2018  . Left lower quadrant pain 01/04/2018  . Bilateral shoulder pain 12/11/2017  . Gastritis 12/11/2017  . Trigger finger, right little finger 09/11/2017  . Numbness and tingling in left hand 07/20/2017  . Dupuytren's contracture of hand 07/20/2017  . CTS (carpal tunnel syndrome) 07/17/2017  . Bilateral carpal tunnel syndrome 07/17/2017  . Arthralgia 06/30/2017  . Paresthesia 06/10/2017  . Leg cramps 06/10/2017  . 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 Chronic midline low back pain  without sciatica 09/15/2016  . Osteopenia 07/20/2016  . Bilateral finger numbness 06/03/2016  . Joint pain 06/03/2016  . Atypical chest pain 02/09/2015  . Hyperlipidemia 02/09/2015  . Bilateral primary osteoarthritis of knee 10/03/2013  . Lateral meniscal tear 10/03/2013  . 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 Varicose vein of leg   . Allergic rhinitis   . IBS (irritable bowel syndrome) 04/07/2011  . Migraine headache 09/27/2010  . Essential hypertension 09/27/2010  . GERD 09/27/2010  . RENAL CALCULUS, HX OF 09/27/2010    Current Outpatient Medications on File Prior to Visit  Medication Sig Dispense Refill  . butalbital-acetaminophen-caffeine (FIORICET, ESGIC) 50-325-40 MG tablet TAKE ONE TABLET BY MOUTH EVERY 4 HOURS AS NEEDED FOR HEADACHE 30 tablet 0  . calcium carbonate (OS-CAL) 600 MG tablet Take 1 tablet (600 mg total) by mouth daily. 60 tablet   . Cholecalciferol (VITAMIN D3) 1000 units CAPS Take 1,000 Units by mouth 3 (three) times a week.     . cloNIDine (CATAPRES) 0.1 MG tablet TAKE ONE TABLET BY MOUTH ONCE DAILY AS NEEDED FOR BP OVER  160/90, REPEAT IN ! HOUR IF NEEDED -- Office visit needed for further refills 30 tablet 0  . cyanocobalamin 1000 MCG tablet Take 1,000 mcg by mouth daily.    Marland Kitchen dicyclomine (BENTYL) 10 MG capsule Take 1 capsule (10 mg total) by mouth  every 8 (eight) hours as needed for spasms. Please schedule an office visit for further refills: [phone] 30 capsule 0  . losartan (COZAAR) 50 MG tablet Take 1 tablet (50 mg total) by mouth daily. 90 tablet 3  . metoprolol succinate (TOPROL-XL) 50 MG 24 hr tablet Take 1 tablet (50 mg total) by mouth daily. 90 tablet 3  . pantoprazole (PROTONIX) 40 MG tablet Take 1 tablet (40 mg total) by mouth daily. 90 tablet 3   No current facility-administered medications on file prior to visit.     Past Medical History:  Diagnosis Date  . Arthritis   . Atypical chest pain   . Diverticulosis   . Gastritis  11/08/2007, 04/12/2012   H pylori bx neg 03/2012  . GERD (gastroesophageal reflux disease) 11/08/2007, 04/12/2012  . Hepatic cyst   . Hiatal hernia 11/08/2007, 04/12/2012  . History of kidney stones   . Hyperlipidemia   . Hypertension   . IBS (irritable bowel syndrome)    diarrhea predom  . Migraine   . Osteoporosis 09/27/2010   DEXA 01/21/2012: -2.1 spine and R fem, -1.8 L fem s/p fosamax  2004-2011 DEXA 04/18/14 @ [facility]: -2.5 - rec to start Prolia    . PVC's (premature ventricular contractions)   . 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 Shingles outbreak 04/2013    Past Surgical History:  Procedure Laterality Date  . APPENDECTOMY  1958  . CYSTOCELE REPAIR  2008  . Maxilofacial  1992  . TONSILLECTOMY  1960    Social History   Socioeconomic History  . Marital status: Married    Spouse name: [name]  . Number of children: 2  . Years of education: Not on file  . Highest education level: Master's degree (e.g., MA, MS, MEng, MEd, MSW, MBA)  Occupational History  . Occupation: retired  Scientific laboratory technician  . Financial resource strain: Not on file  . Food insecurity:    Worry: Not on file    Inability: Not on file  . Transportation needs:    Medical: Not on file    Non-medical: Not on file  Tobacco Use  . Smoking status: Never Smoker  . Smokeless tobacco: Never Used  Substance and Sexual Activity  . Alcohol use: No  . Drug use: No  . Sexual activity: Not on file  Lifestyle  . Physical activity:    Days per week: Not on file    Minutes per session: Not on file  . Stress: Not on file  Relationships  . Social connections:    Talks on phone: Not on file    Gets together: Not on file    Attends religious service: Not on file    Active member of club or organization: Not on file    Attends meetings of clubs or organizations: Not on file    Relationship status: Not on file  Other Topics Concern  . Not on file  Social History Narrative   Married,  lives with spouse. Retired Licensed conveyancer, Print production planner. Has 3 kids (moved to [place] to be closer)- youngest son MD. Dorie Rank to [place] from [place] 06/2010, lived in [place] x 10years    Family History  Problem Relation Age of Onset  . Hypertension Mother   . Alzheimer's disease Mother 64  . AAA (abdominal aortic aneurysm) Mother   . Stroke Father 23  . Kidney cancer Brother        mets  . Bone cancer Brother 11  . Colon  cancer Neg Hx     Review of Systems  Constitutional: Negative for fever.  Respiratory: Negative for cough, shortness of breath and wheezing.   Cardiovascular: Negative for chest pain, palpitations and leg swelling.  Gastrointestinal: Positive for abdominal pain (Epigastric region). Negative for blood in stool and nausea.       No GERD  Neurological: Positive for headaches.       Objective:   Vitals:   02/08/19 1525  BP: (!) 142/68  Pulse: 67  Resp: 16  Temp: 98.1 F (36.7 C)  SpO2: 96%   BP Readings from Last 3 Encounters:  02/08/19 (!) 142/68  01/20/19 108/68  10/27/18 (!) 144/88   Wt Readings from Last 3 Encounters:  02/08/19 144 lb 6.4 oz (65.5 kg)  01/20/19 145 lb (65.8 kg)  10/27/18 147 lb (66.7 kg)   Body mass index is 25.58 kg/m.   Physical Exam    Constitutional: Appears well-developed and well-nourished. No distress.  HENT:  Head: Normocephalic and atraumatic.  Neck: Neck supple. No tracheal deviation present. No thyromegaly present.  No cervical lymphadenopathy Cardiovascular: Normal rate, regular rhythm and normal heart sounds.   No murmur heard. No carotid bruit .  No edema Pulmonary/Chest: Effort normal and breath sounds normal. No respiratory distress. No has no wheezes. No rales. Abdomen: Soft, nondistended minimal tenderness epigastric region without rebound or guarding, no tenderness elsewhere Skin: Skin is warm and dry. Not diaphoretic.  Psychiatric: Normal mood and affect. Behavior is normal.      Assessment & Plan:    See  Problem List for Assessment and Plan of chronic medical problems.

## 2019-02-07 NOTE — Telephone Encounter (Signed)
Patient has requested orders for DEXA scan. Last one was 2017. Please advise on orders

## 2019-02-08 ENCOUNTER — Ambulatory Visit (INDEPENDENT_AMBULATORY_CARE_PROVIDER_SITE_OTHER)
Admission: RE | Admit: 2019-02-08 | Discharge: 2019-02-08 | Disposition: A | Discharge status: Home / Self Care | Payer: PPO | Source: Ambulatory Visit | Attending: Internal Medicine | Admitting: Internal Medicine

## 2019-02-08 ENCOUNTER — Encounter: Payer: Self-pay | Admitting: Internal Medicine

## 2019-02-08 ENCOUNTER — Ambulatory Visit (INDEPENDENT_AMBULATORY_CARE_PROVIDER_SITE_OTHER): Payer: PPO | Admitting: Internal Medicine

## 2019-02-08 ENCOUNTER — Other Ambulatory Visit: Payer: Self-pay

## 2019-02-08 VITALS — BP 142/68 | HR 67 | Temp 98.1°F | Resp 16 | Ht 63.0 in | Wt 144.4 lb

## 2019-02-08 DIAGNOSIS — M545 Low back pain, unspecified: Secondary | ICD-10-CM

## 2019-02-08 DIAGNOSIS — M8589 Other specified disorders of bone density and structure, multiple sites: Secondary | ICD-10-CM

## 2019-02-08 DIAGNOSIS — G8929 Other chronic pain: Secondary | ICD-10-CM

## 2019-02-08 DIAGNOSIS — R1013 Epigastric pain: Secondary | ICD-10-CM

## 2019-02-08 NOTE — Assessment & Plan Note (Signed)
History of osteoporosis, most recent DEXA scan here showed osteopenia Has not been on medication Had a bone density done recently in [place] that showed osteoporosis and she is interested in treatment Had bone density done here today-we will evaluate-likely needs treatment will consider Prolia as first option if covered by insurance If not covered will consider Reclast Continue calcium and vitamin D daily Increase walking Encouraged her to start doing weights with her arms-her bone density in [place] showed significant osteoporosis in her forearm

## 2019-02-08 NOTE — Assessment & Plan Note (Addendum)
2 weeks of epigastric pain-it has been getting better History of gastritis, IBS and has hiatal hernia Any of these could be the cause She is taking pantoprazole daily as prescribed and feels GERD is controlled Pain seems to be improved with dicyclomine and with frequent bowel movements last week this could be related to IBS-continue dicyclomine and advised that we can increase the dose if needed Can try over-the-counter Mylanta If no improvement may need to see GI

## 2019-02-08 NOTE — Assessment & Plan Note (Signed)
Chronic intermittent lower back pain No radiculopathy Likely arthritis Tylenol helps Advised core strengthening Will refer to Dr. Tamala Julian for further evaluation

## 2019-02-08 NOTE — Patient Instructions (Addendum)
We will let you know the results of the bone density scan.   Medications reviewed and updated.  Changes include :   none    See Dr Tamala Julian for your back.    Start doing weights.

## 2019-02-09 ENCOUNTER — Encounter: Payer: Self-pay | Admitting: Internal Medicine

## 2019-02-09 ENCOUNTER — Encounter: Payer: Self-pay | Admitting: Family Medicine

## 2019-02-09 ENCOUNTER — Ambulatory Visit (INDEPENDENT_AMBULATORY_CARE_PROVIDER_SITE_OTHER)
Admission: RE | Admit: 2019-02-09 | Discharge: 2019-02-09 | Disposition: A | Discharge status: Home / Self Care | Payer: PPO | Source: Ambulatory Visit | Attending: Family Medicine | Admitting: Family Medicine

## 2019-02-09 ENCOUNTER — Ambulatory Visit (INDEPENDENT_AMBULATORY_CARE_PROVIDER_SITE_OTHER): Payer: PPO | Admitting: Family Medicine

## 2019-02-09 VITALS — BP 132/82 | HR 63 | Ht 63.0 in | Wt 143.0 lb

## 2019-02-09 DIAGNOSIS — G8929 Other chronic pain: Secondary | ICD-10-CM | POA: Diagnosis not present

## 2019-02-09 DIAGNOSIS — M545 Low back pain, unspecified: Secondary | ICD-10-CM

## 2019-02-09 DIAGNOSIS — M2550 Pain in unspecified joint: Secondary | ICD-10-CM

## 2019-02-09 MED ORDER — GABAPENTIN 100 MG PO CAPS: 200.0000 mg | ORAL_CAPSULE | Freq: Every day | ORAL | 3 refills | Status: DC

## 2019-02-09 MED ORDER — METHYLPREDNISOLONE ACETATE 80 MG/ML IJ SUSP
80.0000 mg | Freq: Once | INTRAMUSCULAR | Status: AC
  Administered 2019-02-09: 80 mg via INTRAMUSCULAR

## 2019-02-09 MED ORDER — KETOROLAC TROMETHAMINE 60 MG/2ML IM SOLN
60.0000 mg | Freq: Once | INTRAMUSCULAR | Status: AC
  Administered 2019-02-09: 60 mg via INTRAMUSCULAR

## 2019-02-09 MED ORDER — TIZANIDINE HCL 2 MG PO CAPS: 2.0000 mg | ORAL_CAPSULE | Freq: Three times a day (TID) | ORAL | 0 refills | Status: DC | PRN

## 2019-02-09 NOTE — Patient Instructions (Addendum)
Good to see you  Ice 20 minutes 2 times daily. Usually after activity and before bed. Exercises 3 times a week.  2 injections today to help with the pain  pennsaid pinkie amount topically 2 times daily as needed.  Gabapentin 200mg  at night- if too groggy in morning then take only 1 pill at night If during the day worsens then can use zanafle (muscle relaxer) but take at home first  See me again in 4 weeks if not great

## 2019-02-09 NOTE — Progress Notes (Signed)
[name] [address] [place], [place] [address] Phone: [phone] Subjective:      CC: Low back pain  TDS:[id number]  [name] [name] is a 73 y.o. female coming in with complaint of low back pain. Patient states that she has had pain for 2 months in lower back. Pain with flexion and prolonged standing. Uses Tylenol for pain management as well as topical analgesic. Denies any radiating symptoms.  Patient denies any radiation of her with sitting or standing for too long starts having increasing discomfort and pain.  Patient did have a history of colitis fairly recently on CT scan.  Patient denies any abdominal pain, and changes in bowel habits or any urinary incontinence.  Patient states that she is more uncomfortable than what she has been in the past.  CT abdomen pelvis was independently visualized by me showing the patient does have moderate to severe facet arthropathy of the lower lumbar spine with very mild spinal stenosis noted.       Past Medical History:  Diagnosis Date  . Arthritis   . Atypical chest pain   . Diverticulosis   . Gastritis 11/08/2007, 04/12/2012   H pylori bx neg 03/2012  . GERD (gastroesophageal reflux disease) 11/08/2007, 04/12/2012  . Hepatic cyst   . Hiatal hernia 11/08/2007, 04/12/2012  . History of kidney stones   . Hyperlipidemia   . Hypertension   . IBS (irritable bowel syndrome)    diarrhea predom  . Migraine   . Osteoporosis 09/27/2010   DEXA 01/21/2012: -2.1 spine and R fem, -1.8 L fem s/p fosamax  2004-2011 DEXA 04/18/14 @ [facility]: -2.5 - rec to start Prolia    . PVC's (premature ventricular contractions)   . 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 Shingles outbreak 04/2013   Past Surgical History:  Procedure Laterality Date  . APPENDECTOMY  1958  . CYSTOCELE REPAIR  2008  . Maxilofacial  1992  . TONSILLECTOMY  1960   Social History   Socioeconomic History  . Marital status:  Married    Spouse name: [name] [name]  . Number of children: 2  . Years of education: Not on file  . Highest education level: Master's degree (e.g., MA, MS, MEng, MEd, MSW, MBA)  Occupational History  . Occupation: retired  Scientific laboratory technician  . Financial resource strain: Not on file  . Food insecurity:    Worry: Not on file    Inability: Not on file  . Transportation needs:    Medical: Not on file    Non-medical: Not on file  Tobacco Use  . Smoking status: Never Smoker  . Smokeless tobacco: Never Used  Substance and Sexual Activity  . Alcohol use: No  . Drug use: No  . Sexual activity: Not on file  Lifestyle  . Physical activity:    Days per week: Not on file    Minutes per session: Not on file  . Stress: Not on file  Relationships  . Social connections:    Talks on phone: Not on file    Gets together: Not on file    Attends religious service: Not on file    Active member of club or organization: Not on file    Attends meetings of clubs or organizations: Not on file    Relationship status: Not on file  Other Topics Concern  . Not on file  Social History Narrative   Married, lives with spouse. Retired Licensed conveyancer, Print production planner. Has  3 kids (moved to [place] to be closer)- youngest son MD. Dorie Rank to [place] from [place] 06/2010, lived in [place] x 10years   No Known Allergies Family History  Problem Relation Age of Onset  . Hypertension Mother   . Alzheimer's disease Mother 15  . AAA (abdominal aortic aneurysm) Mother   . Stroke Father 45  . Kidney cancer Brother        mets  . Bone cancer Brother 22  . Colon cancer Neg Hx      Current Outpatient Medications (Cardiovascular):  .  cloNIDine (CATAPRES) 0.1 MG tablet, TAKE ONE TABLET BY MOUTH ONCE DAILY AS NEEDED FOR BP OVER  160/90, REPEAT IN ! HOUR IF NEEDED -- Office visit needed for further refills .  losartan (COZAAR) 50 MG tablet, Take 1 tablet (50 mg total) by mouth daily. .  metoprolol succinate (TOPROL-XL) 50 MG 24 hr tablet,  Take 1 tablet (50 mg total) by mouth daily.   Current Outpatient Medications (Analgesics):  .  butalbital-acetaminophen-caffeine (FIORICET, ESGIC) 50-325-40 MG tablet, TAKE ONE TABLET BY MOUTH EVERY 4 HOURS AS NEEDED FOR HEADACHE  Current Outpatient Medications (Hematological):  .  cyanocobalamin 1000 MCG tablet, Take 1,000 mcg by mouth daily.  Current Outpatient Medications (Other):  .  calcium carbonate (OS-CAL) 600 MG tablet, Take 1 tablet (600 mg total) by mouth daily. .  Cholecalciferol (VITAMIN D3) 1000 units CAPS, Take 1,000 Units by mouth 3 (three) times a week.  .  dicyclomine (BENTYL) 10 MG capsule, Take 1 capsule (10 mg total) by mouth every 8 (eight) hours as needed for spasms. Please schedule an office visit for further refills: [phone] .  pantoprazole (PROTONIX) 40 MG tablet, Take 1 tablet (40 mg total) by mouth daily. Marland Kitchen  gabapentin (NEURONTIN) 100 MG capsule, Take 2 capsules (200 mg total) by mouth at bedtime. .  tizanidine (ZANAFLEX) 2 MG capsule, Take 1 capsule (2 mg total) by mouth 3 (three) times daily as needed for muscle spasms.    Past medical history, social, surgical and family history all reviewed in electronic medical record.  No pertanent information unless stated regarding to the chief complaint.   Review of Systems:  No headache, visual changes, nausea, vomiting, diarrhea, constipation, dizziness, abdominal pain, skin rash, fevers, chills, night sweats, weight loss, swollen lymph nodes, body aches, joint swelling,  chest pain, shortness of breath, mood changes.  Positive muscle aches  Objective  Blood pressure 132/82, pulse 63, height 5\' 3"  (1.6 m), weight 143 lb (64.9 kg), SpO2 97 %.   General: No apparent distress alert and oriented x3 mood and affect normal, dressed appropriately.  HEENT: Pupils equal, extraocular movements intact  Respiratory: Patient's speak in full sentences and does not appear short of breath  Cardiovascular: No lower extremity  edema, non tender, no erythema  Skin: Warm dry intact with no signs of infection or rash on extremities or on axial skeleton.  Abdomen: Soft nontender  Neuro: Cranial nerves II through XII are intact, neurovascularly intact in all extremities with 2+ DTRs and 2+ pulses.  Lymph: No lymphadenopathy of posterior or anterior cervical chain or axillae bilaterally.  Gait normal with good balance and coordination.  MSK:  Non tender with full range of motion and good stability and symmetric strength and tone of shoulders, elbows, wrist, hip, knee and ankles bilaterally.  Mild arthritic changes of multiple joints  Back Exam:  Inspection: Mild loss of lordosis Motion: Flexion 40 deg, Extension 25 deg, Side Bending to 35 deg bilaterally,  Rotation to 45 deg bilaterally  SLR laying: Negative  XSLR laying: Negative  Palpable tenderness: Tender to palpation the paraspinal musculature lumbar spine right greater than left. FABER: Tightness bilaterally right greater than left. Sensory change: Gross sensation intact to all lumbar and sacral dermatomes.  Reflexes: 2+ at both patellar tendons, 2+ at achilles tendons, Babinski's downgoing.  Strength at foot  Plantar-flexion: 5/5 Dorsi-flexion: 5/5 Eversion: 5/5 Inversion: 5/5  Leg strength  Quad: 5/5 Hamstring: 5/5 Hip flexor: 5/5 Hip abductors: 5/5     Impression and Recommendations:     This case required medical decision making of moderate complexity. The above documentation has been reviewed and is accurate and complete Lyndal Pulley, DO       Note: This dictation was prepared with Dragon dictation along with smaller phrase technology. Any transcriptional errors that result from this process are unintentional.

## 2019-02-09 NOTE — Assessment & Plan Note (Signed)
Patient having more of a spasm recently.  He did have a recent bout of colitis as well.  Gabapentin given, 2 injections including Toradol and Depo-Medrol given.  Warned of potential small side effects.  Discussed icing regimen, home exercises given and work with Product/process development scientist.  Follow-up again in 4 weeks

## 2019-02-15 ENCOUNTER — Telehealth: Payer: Self-pay | Admitting: Internal Medicine

## 2019-02-15 NOTE — Telephone Encounter (Signed)
She has severe osteopenia with an elevated FRAX.  She is interested in Prolia if covered.  Can you please check into coverage.  Thank you.   [If this is not covered we will consider Reclast]

## 2019-02-15 NOTE — Telephone Encounter (Signed)
Patient's insurance will be verified, I will call her with summary of benefits to discuss

## 2019-02-24 NOTE — Progress Notes (Signed)
Subjective:    Patient ID: [name] [name], female    DOB: 03-09-1946, 73 y.o.   MRN: [id number]  HPI The patient is here for an acute visit.  She has had a small bump in her right groin area for a while.  4-5 days ago she noticed that it was changing.  It started to become larger, red and tender.  A couple of days ago there is a small amount of discharge.  It has become more painful and she is concerned about infection.  She denies any fevers or chills.  She denies other skin issues elsewhere.  Hypertension: She is taking her medication daily. She is compliant with a low sodium diet.  She denies chest pain, palpitations, edema, shortness of breath. She does monitor her blood pressure at home-it is high at times and the other day it was 180/?Marland Kitchen  That day she did have a subconjunctival hemorrhage.  She was unsure if the blood pressure was the cause.       Medications and allergies reviewed with patient and updated if appropriate.  Patient Active Problem List   Diagnosis Date Noted  . Headache behind the eyes 06/03/2018  . Skin abnormalities 05/07/2018  . Left lower quadrant pain 01/04/2018  . Bilateral shoulder pain 12/11/2017  . Gastritis 12/11/2017  . Trigger finger, right little finger 09/11/2017  . Numbness and tingling in left hand 07/20/2017  . Dupuytren's contracture of hand 07/20/2017  . CTS (carpal tunnel syndrome) 07/17/2017  . Bilateral carpal tunnel syndrome 07/17/2017  . Arthralgia 06/30/2017  . Paresthesia 06/10/2017  . Leg cramps 06/10/2017  . 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 Chronic midline low back pain without sciatica 09/15/2016  . Osteopenia 07/20/2016  . Bilateral finger numbness 06/03/2016  . Joint pain 06/03/2016  . Atypical chest pain 02/09/2015  . Hyperlipidemia 02/09/2015  . Bilateral primary osteoarthritis of knee 10/03/2013  . Lateral meniscal tear 10/03/2013  . 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 Varicose vein of leg   .  Allergic rhinitis   . IBS (irritable bowel syndrome) 04/07/2011  . Epigastric pain 12/26/2010  . Migraine headache 09/27/2010  . Essential hypertension 09/27/2010  . GERD 09/27/2010  . RENAL CALCULUS, HX OF 09/27/2010    Current Outpatient Medications on File Prior to Visit  Medication Sig Dispense Refill  . butalbital-acetaminophen-caffeine (FIORICET, ESGIC) 50-325-40 MG tablet TAKE ONE TABLET BY MOUTH EVERY 4 HOURS AS NEEDED FOR HEADACHE 30 tablet 0  . calcium carbonate (OS-CAL) 600 MG tablet Take 1 tablet (600 mg total) by mouth daily. 60 tablet   . Cholecalciferol (VITAMIN D3) 1000 units CAPS Take 1,000 Units by mouth 3 (three) times a week.     . cloNIDine (CATAPRES) 0.1 MG tablet TAKE ONE TABLET BY MOUTH ONCE DAILY AS NEEDED FOR BP OVER  160/90, REPEAT IN ! HOUR IF NEEDED -- Office visit needed for further refills 30 tablet 0  . cyanocobalamin 1000 MCG tablet Take 1,000 mcg by mouth daily.    Marland Kitchen dicyclomine (BENTYL) 10 MG capsule Take 1 capsule (10 mg total) by mouth every 8 (eight) hours as needed for spasms. Please schedule an office visit for further refills: [phone] 30 capsule 0  . gabapentin (NEURONTIN) 100 MG capsule Take 2 capsules (200 mg total) by mouth at bedtime. 60 capsule 3  . metoprolol succinate (TOPROL-XL) 50 MG 24 hr tablet Take 1 tablet (50 mg total) by mouth daily. 90 tablet 3  . pantoprazole (PROTONIX) 40 MG  tablet Take 1 tablet (40 mg total) by mouth daily. 90 tablet 3  . tizanidine (ZANAFLEX) 2 MG capsule Take 1 capsule (2 mg total) by mouth 3 (three) times daily as needed for muscle spasms. 20 capsule 0   No current facility-administered medications on file prior to visit.     Past Medical History:  Diagnosis Date  . Arthritis   . Atypical chest pain   . Diverticulosis   . Gastritis 11/08/2007, 04/12/2012   H pylori bx neg 03/2012  . GERD (gastroesophageal reflux disease) 11/08/2007, 04/12/2012  . Hepatic cyst   . Hiatal hernia 11/08/2007, 04/12/2012  .  History of kidney stones   . Hyperlipidemia   . Hypertension   . IBS (irritable bowel syndrome)    diarrhea predom  . Migraine   . Osteoporosis 09/27/2010   DEXA 01/21/2012: -2.1 spine and R fem, -1.8 L fem s/p fosamax  2004-2011 DEXA 04/18/14 @ [facility]: -2.5 - rec to start Prolia    . PVC's (premature ventricular contractions)   . 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 Shingles outbreak 04/2013    Past Surgical History:  Procedure Laterality Date  . APPENDECTOMY  1958  . CYSTOCELE REPAIR  2008  . Maxilofacial  1992  . TONSILLECTOMY  1960    Social History   Socioeconomic History  . Marital status: Married    Spouse name: [name]  . Number of children: 2  . Years of education: Not on file  . Highest education level: Master's degree (e.g., MA, MS, MEng, MEd, MSW, MBA)  Occupational History  . Occupation: retired  Scientific laboratory technician  . Financial resource strain: Not on file  . Food insecurity:    Worry: Not on file    Inability: Not on file  . Transportation needs:    Medical: Not on file    Non-medical: Not on file  Tobacco Use  . Smoking status: Never Smoker  . Smokeless tobacco: Never Used  Substance and Sexual Activity  . Alcohol use: No  . Drug use: No  . Sexual activity: Not on file  Lifestyle  . Physical activity:    Days per week: Not on file    Minutes per session: Not on file  . Stress: Not on file  Relationships  . Social connections:    Talks on phone: Not on file    Gets together: Not on file    Attends religious service: Not on file    Active member of club or organization: Not on file    Attends meetings of clubs or organizations: Not on file    Relationship status: Not on file  Other Topics Concern  . Not on file  Social History Narrative   Married, lives with spouse. Retired Licensed conveyancer, Print production planner. Has 3 kids (moved to [place] to be closer)- youngest son MD. Dorie Rank to [place] from [place] 06/2010, lived in [place] x 10years     Family History  Problem Relation Age of Onset  . Hypertension Mother   . Alzheimer's disease Mother 35  . AAA (abdominal aortic aneurysm) Mother   . Stroke Father 76  . Kidney cancer Brother        mets  . Bone cancer Brother 85  . Colon cancer Neg Hx     Review of Systems  Constitutional: Negative for chills and fever.  Respiratory: Negative for cough, shortness of breath and wheezing.   Cardiovascular: Negative for chest pain, palpitations and leg swelling.  Skin:  Positive for color change and wound.  Neurological: Positive for headaches (Chronic). Negative for light-headedness.       Objective:   Vitals:   02/25/19 0901  BP: (!) 150/72  Pulse: 68  Resp: 16  Temp: 98.3 F (36.8 C)  SpO2: 97%   BP Readings from Last 3 Encounters:  02/25/19 (!) 150/72  02/09/19 132/82  02/08/19 (!) 142/68   Wt Readings from Last 3 Encounters:  02/25/19 142 lb 1.9 oz (64.5 kg)  02/09/19 143 lb (64.9 kg)  02/08/19 144 lb 6.4 oz (65.5 kg)   Body mass index is 25.18 kg/m.   Physical Exam Constitutional:      General: She is not in acute distress.    Appearance: Normal appearance. She is not ill-appearing.  HENT:     Head: Normocephalic and atraumatic.     Mouth/Throat:     Mouth: Mucous membranes are moist.  Eyes:     Comments: Right subconjunctival hemorrhage, left conjunctive are normal  Cardiovascular:     Rate and Rhythm: Normal rate and regular rhythm.  Pulmonary:     Effort: Pulmonary effort is normal. No respiratory distress.     Breath sounds: Normal breath sounds. No wheezing or rhonchi.  Musculoskeletal:     Right lower leg: No edema.     Left lower leg: No edema.  Skin:    General: Skin is warm and dry.     Findings: Lesion (Infected cyst right groin approximately the size of a medium-sized grape, erythematous, fluctuant with slight pus discharge with light pressure.  I attempted to drain further by applying pressure, but she did not tolerate the pain) present.  No rash.  Neurological:     Mental Status: She is alert.            Assessment & Plan:    See Problem List for Assessment and Plan of chronic medical problems.

## 2019-02-25 ENCOUNTER — Encounter: Payer: Self-pay | Admitting: Internal Medicine

## 2019-02-25 ENCOUNTER — Other Ambulatory Visit: Payer: Self-pay

## 2019-02-25 ENCOUNTER — Ambulatory Visit (INDEPENDENT_AMBULATORY_CARE_PROVIDER_SITE_OTHER): Payer: PPO | Admitting: Internal Medicine

## 2019-02-25 VITALS — BP 150/72 | HR 68 | Temp 98.3°F | Resp 16 | Ht 63.0 in | Wt 142.1 lb

## 2019-02-25 DIAGNOSIS — L02234 Carbuncle of groin: Secondary | ICD-10-CM | POA: Diagnosis not present

## 2019-02-25 DIAGNOSIS — I10 Essential (primary) hypertension: Secondary | ICD-10-CM

## 2019-02-25 HISTORY — DX: Carbuncle of groin: L02.234

## 2019-02-25 MED ORDER — LOSARTAN POTASSIUM 100 MG PO TABS: 100.0000 mg | ORAL_TABLET | Freq: Every day | ORAL | 3 refills | Status: DC

## 2019-02-25 MED ORDER — CEPHALEXIN 500 MG PO CAPS: 500.0000 mg | ORAL_CAPSULE | Freq: Three times a day (TID) | ORAL | 0 refills | Status: DC

## 2019-02-25 NOTE — Assessment & Plan Note (Signed)
Blood pressure not ideally controlled Increase losartan to 100 mg daily Continue metoprolol 50 mg once daily She will continue to monitor her blood pressure at home and can update me if it does not improve or if she has any questions or concerns through my chart

## 2019-02-25 NOTE — Assessment & Plan Note (Signed)
Infected carbuncle of the right groin There was some slight discharge-pus, but she was did not tolerate the applying pressure and draining it further Advised warm soaks in a hot tub or warm compresses Start Keflex 500 mg 3 times daily for 7 days She will call if it does not improve/resolve

## 2019-02-25 NOTE — Patient Instructions (Addendum)
Medications reviewed and updated.  Changes include :   Increase losartan to 100 mg daily.  Start Keflex three times a day for your infection.  Apply warm compresses to the area or sit in a hot tub.   Your prescription(s) have been submitted to your pharmacy. Please take as directed and contact our office if you believe you are having problem(s) with the medication(s).   Please call if there is no improvement in your symptoms.

## 2019-02-28 ENCOUNTER — Encounter: Payer: Self-pay | Admitting: Internal Medicine

## 2019-03-01 ENCOUNTER — Ambulatory Visit: Payer: PPO | Admitting: Family Medicine

## 2019-03-08 ENCOUNTER — Encounter: Payer: Self-pay | Admitting: Internal Medicine

## 2019-03-08 DIAGNOSIS — R001 Bradycardia, unspecified: Secondary | ICD-10-CM

## 2019-03-09 ENCOUNTER — Ambulatory Visit: Payer: PPO | Admitting: Family Medicine

## 2019-04-06 ENCOUNTER — Telehealth: Payer: Self-pay | Admitting: Cardiovascular Disease

## 2019-04-08 ENCOUNTER — Telehealth (INDEPENDENT_AMBULATORY_CARE_PROVIDER_SITE_OTHER): Payer: PPO | Admitting: Cardiovascular Disease

## 2019-04-08 ENCOUNTER — Telehealth: Payer: Self-pay

## 2019-04-08 ENCOUNTER — Encounter: Payer: Self-pay | Admitting: Cardiovascular Disease

## 2019-04-08 ENCOUNTER — Encounter

## 2019-04-08 DIAGNOSIS — R0789 Other chest pain: Secondary | ICD-10-CM | POA: Diagnosis not present

## 2019-04-08 DIAGNOSIS — I10 Essential (primary) hypertension: Secondary | ICD-10-CM

## 2019-04-08 DIAGNOSIS — R0609 Other forms of dyspnea: Secondary | ICD-10-CM

## 2019-04-08 DIAGNOSIS — R0600 Dyspnea, unspecified: Secondary | ICD-10-CM | POA: Insufficient documentation

## 2019-04-08 NOTE — Patient Instructions (Signed)
Medication Instructions:  Your physician recommends that you continue on your current medications as directed. Please refer to the Current Medication list given to you today.  If you need a refill on your cardiac medications before your next appointment, please call your pharmacy.   Lab work: NONE If you have labs (blood work) drawn today and your tests are completely normal, you will receive your results only by: Marland Kitchen MyChart Message (if you have MyChart) OR . A paper copy in the mail If you have any lab test that is abnormal or we need to change your treatment, we will call you to review the results.  Testing/Procedures: NONE  Follow-Up: At [facility], you and your health needs are our priority.  As part of our continuing mission to provide you with exceptional heart care, we have created designated Provider Care Teams.  These Care Teams include your primary Cardiologist (physician) and Advanced Practice Providers (APPs -  Physician Assistants and Nurse Practitioners) who all work together to provide you with the care you need, when you need it. You will need a follow up appointment in 3 months WITH DR. Gwenlyn Found.  Please call our office 2 months in advance to schedule this appointment.

## 2019-04-08 NOTE — Progress Notes (Signed)
Virtual Visit via Video Note   This visit type was conducted due to national recommendations for restrictions regarding the COVID-19 Pandemic (e.g. social distancing) in an effort to limit this patient's exposure and mitigate transmission in our community.  Due to her co-morbid illnesses, this patient is at least at moderate risk for complications without adequate follow up.  This format is felt to be most appropriate for this patient at this time.  All issues noted in this document were discussed and addressed.  A limited physical exam was performed with this format.  Please refer to the patient's chart for her consent to telehealth for [facility].   Date:  04/08/2019   ID:  [name] [name], DOB [birth year], MRN [id number]  Patient Location: Home Provider Location: Home  PCP:  Binnie Rail, MD  Cardiologist: Dr. Quay Burow Electrophysiologist:  None   Evaluation Performed:  New Patient Evaluation  Chief Complaint: Atypical chest pain, dyspnea on exertion  History of Present Illness:    [name] [name] is a 73 -year-old married Caucasian female mother of 3 children, grandmother to 2 grandchildren, patient Dr. Billey Gosling who saw Dr. Sallyanne Kuster remotely.  She is accompanied by her husband [name] today.  She was referred for atypical chest pain.  I last saw her in the office 02/09/2015.  Her cardiac risk factor profile is notable for treated hypertension and mild hyperlipidemia. She has never had a heart attack or stroke. She is otherwise healthy except for GERD. She doesn't saw Dr. Sallyanne Kuster back in 2011 for atypical chest pain and workup was negative including a Myoview stress test. She was recently out of the country for several months and returned one month ago. Since that time she's had daily chest pain. She was under a lot of stress and she was away the pain itself sounds like GERD, begin subxiphoid and has no other characteristic symptoms of angina.  Since I saw her 4 years ago she  is done relatively well.  She has noticed some increasing dyspnea on exertion over the last several months.  She complains of atypical chest pain but this is more positional and she can press on her sternum and reproduce this.  She says he is this is similar to her pain 10 years ago when she had a negative Myoview stress test.  The patient does not have symptoms concerning for COVID-19 infection (fever, chills, cough, or new shortness of breath).    Past Medical History:  Diagnosis Date  . Arthritis   . Atypical chest pain   . Diverticulosis   . Gastritis 11/08/2007, 04/12/2012   H pylori bx neg 03/2012  . GERD (gastroesophageal reflux disease) 11/08/2007, 04/12/2012  . Hepatic cyst   . Hiatal hernia 11/08/2007, 04/12/2012  . History of kidney stones   . Hyperlipidemia   . Hypertension   . IBS (irritable bowel syndrome)    diarrhea predom  . Migraine   . Osteoporosis 09/27/2010   DEXA 01/21/2012: -2.1 spine and R fem, -1.8 L fem s/p fosamax  2004-2011 DEXA 04/18/14 @ [facility]: -2.5 - rec to start Prolia    . PVC's (premature ventricular contractions)   . 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 Shingles outbreak 04/2013   Past Surgical History:  Procedure Laterality Date  . APPENDECTOMY  1958  . CYSTOCELE REPAIR  2008  . Maxilofacial  1992  . TONSILLECTOMY  1960     No outpatient medications have been marked as taking for the  04/08/19 encounter (Appointment) with Lorretta Harp, MD.     Allergies:   Patient has no known allergies.   Social History   Tobacco Use  . Smoking status: Never Smoker  . Smokeless tobacco: Never Used  Substance Use Topics  . Alcohol use: No  . Drug use: No     Family Hx: The patient's family history includes AAA (abdominal aortic aneurysm) in her mother; Alzheimer's disease (age of onset: 49) in her mother; Bone cancer (age of onset: 16) in her brother; Hypertension in her mother; Kidney cancer in her brother; Stroke (age  of onset: 21) in her father. There is no history of Colon cancer.  ROS:   Please see the history of present illness.     All other systems reviewed and are negative.   Prior CV studies:   The following studies were reviewed today:  None  Labs/Other Tests and Data Reviewed:    EKG:  No ECG reviewed.  Recent Labs: 10/27/2018: ALT 18; BUN 17; Creatinine, Ser 0.78; Hemoglobin 14.3; Platelets 233.0; Potassium 4.2; Sodium 140   Recent Lipid Panel Lab Results  Component Value Date/Time   CHOL 181 10/27/2018 10:35 AM   TRIG 164.0 (H) 10/27/2018 10:35 AM   TRIG 177 06/14/2010   HDL 50.10 10/27/2018 10:35 AM   CHOLHDL 4 10/27/2018 10:35 AM   LDLCALC 98 10/27/2018 10:35 AM   LDLDIRECT 133.0 02/07/2013 08:19 AM    Wt Readings from Last 3 Encounters:  02/25/19 142 lb 1.9 oz (64.5 kg)  02/09/19 143 lb (64.9 kg)  02/08/19 144 lb 6.4 oz (65.5 kg)     Objective:    Vital Signs:  There were no vitals taken for this visit.   VITAL SIGNS:  reviewed GEN:  no acute distress RESPIRATORY:  normal respiratory effort, symmetric expansion NEURO:  alert and oriented x 3, no obvious focal deficit PSYCH:  normal affect  ASSESSMENT & PLAN:    1. Essential hypertension- history of essential hypertension with blood pressure measured by the patient today of 134/70 and a pulse of 63.  She is on clonidine, losartan and metoprolol. 2. Atypical chest pain- history of atypical chest pain with negative Myoview 09/16/2010.  She has similar symptoms recently with positional pain and pain on palpation that does not sound anginal. 3. Dyspnea on exertion- new onset dyspnea exertion when walking up an incline over the last several months.  There is no prior history of tobacco abuse.  I am going to see her back in the office in 3 months to further evaluate.  If this is a reproducible symptom she may need an ischemia work-up.  COVID-19 Education: The signs and symptoms of COVID-19 were discussed with the  patient and how to seek care for testing (follow up with PCP or arrange E-visit).  The importance of social distancing was discussed today.  Time:   Today, I have spent 8 minutes with the patient with telehealth technology discussing the above problems.     Medication Adjustments/Labs and Tests Ordered: Current medicines are reviewed at length with the patient today.  Concerns regarding medicines are outlined above.   Tests Ordered: No orders of the defined types were placed in this encounter.   Medication Changes: No orders of the defined types were placed in this encounter.   Disposition:  Follow up in 3 month(s)  Signed, Quay Burow, MD  04/08/2019 8:46 AM    [place]

## 2019-04-08 NOTE — Telephone Encounter (Signed)
Patient and/or DPR-approved person aware of AVS instructions and verbalized understanding. AVS RELEASED TO [facility]

## 2019-04-30 NOTE — Progress Notes (Signed)
Subjective:    Patient ID: [name] [name], female    DOB: 1946-09-14, 73 y.o.   MRN: [id number]  HPI The patient is here for follow up.  She is exercising regularly - walking 3-4 times a week.     Hypertension: She is taking her medication daily. She is compliant with a low sodium diet.  She denies palpitations, edema and regular headaches. She does monitor her blood pressure at home - it is well controlled.    GERD:  She is taking her medication daily as prescribed.  She denies any GERD symptoms and feels her GERD is well controlled.   Atypical chest pain, DOE:  She recently saw cardiology who felt her pain was not cardiac in nature.  The chest pain is reproducible.  She does have tenderness when she pushes on her sternum.  She will see cardiology again in a couple of months for further evaluation of the DOE - possible ischemic w/u.   She only has DOE with inclines.  She is able to walk on flat surfaces without difficulty.  Migraines: She will try Tylenol first, but if it is not effective she takes fioricet as needed.  The medication is effective.    Medications and allergies reviewed with patient and updated if appropriate.  Patient Active Problem List   Diagnosis Date Noted  . Prediabetes 05/02/2019  . Dyspnea on exertion 04/08/2019  . Carbuncle of groin 02/25/2019  . Headache behind the eyes 06/03/2018  . Skin abnormalities 05/07/2018  . Left lower quadrant pain 01/04/2018  . Bilateral shoulder pain 12/11/2017  . Gastritis 12/11/2017  . Trigger finger, right little finger 09/11/2017  . Numbness and tingling in left hand 07/20/2017  . Dupuytren's contracture of hand 07/20/2017  . CTS (carpal tunnel syndrome) 07/17/2017  . Bilateral carpal tunnel syndrome 07/17/2017  . Arthralgia 06/30/2017  . Paresthesia 06/10/2017  . Leg cramps 06/10/2017  . 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 Chronic midline low back pain without sciatica 09/15/2016  . Osteopenia  07/20/2016  . Bilateral finger numbness 06/03/2016  . Joint pain 06/03/2016  . Atypical chest pain 02/09/2015  . Hyperlipidemia 02/09/2015  . Bilateral primary osteoarthritis of knee 10/03/2013  . Lateral meniscal tear 10/03/2013  . 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 Varicose vein of leg   . Allergic rhinitis   . IBS (irritable bowel syndrome) 04/07/2011  . Epigastric pain 12/26/2010  . Migraine headache 09/27/2010  . Essential hypertension 09/27/2010  . GERD 09/27/2010  . RENAL CALCULUS, HX OF 09/27/2010    Current Outpatient Medications on File Prior to Visit  Medication Sig Dispense Refill  . butalbital-acetaminophen-caffeine (FIORICET, ESGIC) 50-325-40 MG tablet TAKE ONE TABLET BY MOUTH EVERY 4 HOURS AS NEEDED FOR HEADACHE 30 tablet 0  . calcium carbonate (OS-CAL) 600 MG tablet Take 1 tablet (600 mg total) by mouth daily. 60 tablet   . Cholecalciferol (VITAMIN D3) 1000 units CAPS Take 1,000 Units by mouth 3 (three) times a week.     . cloNIDine (CATAPRES) 0.1 MG tablet TAKE ONE TABLET BY MOUTH ONCE DAILY AS NEEDED FOR BP OVER  160/90, REPEAT IN ! HOUR IF NEEDED -- Office visit needed for further refills 30 tablet 0  . cyanocobalamin 1000 MCG tablet Take 1,000 mcg by mouth daily.    Marland Kitchen dicyclomine (BENTYL) 10 MG capsule Take 1 capsule (10 mg total) by mouth every 8 (eight) hours as needed for spasms. Please schedule an office visit for further refills: [phone]  30 capsule 0  . losartan (COZAAR) 100 MG tablet Take by mouth daily. Pt takes 50 mg daily    . metoprolol succinate (TOPROL-XL) 50 MG 24 hr tablet Take by mouth daily. Pt takes 25 in the AM and 25 at bedtime    . pantoprazole (PROTONIX) 40 MG tablet Take 1 tablet (40 mg total) by mouth daily. 90 tablet 3   No current facility-administered medications on file prior to visit.     Past Medical History:  Diagnosis Date  . Arthritis   . Atypical chest pain   . Diverticulosis   . Gastritis 11/08/2007, 04/12/2012    H pylori bx neg 03/2012  . GERD (gastroesophageal reflux disease) 11/08/2007, 04/12/2012  . Hepatic cyst   . Hiatal hernia 11/08/2007, 04/12/2012  . History of kidney stones   . Hyperlipidemia   . Hypertension   . IBS (irritable bowel syndrome)    diarrhea predom  . Migraine   . Osteoporosis 09/27/2010   DEXA 01/21/2012: -2.1 spine and R fem, -1.8 L fem s/p fosamax  2004-2011 DEXA 04/18/14 @ [facility]: -2.5 - rec to start Prolia    . PVC's (premature ventricular contractions)   . 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 Shingles outbreak 04/2013    Past Surgical History:  Procedure Laterality Date  . APPENDECTOMY  1958  . CYSTOCELE REPAIR  2008  . Maxilofacial  1992  . TONSILLECTOMY  1960    Social History   Socioeconomic History  . Marital status: Married    Spouse name: [name]  . Number of children: 2  . Years of education: Not on file  . Highest education level: Master's degree (e.g., MA, MS, MEng, MEd, MSW, MBA)  Occupational History  . Occupation: retired  Scientific laboratory technician  . Financial resource strain: Not on file  . Food insecurity:    Worry: Not on file    Inability: Not on file  . Transportation needs:    Medical: Not on file    Non-medical: Not on file  Tobacco Use  . Smoking status: Never Smoker  . Smokeless tobacco: Never Used  Substance and Sexual Activity  . Alcohol use: No  . Drug use: No  . Sexual activity: Not on file  Lifestyle  . Physical activity:    Days per week: Not on file    Minutes per session: Not on file  . Stress: Not on file  Relationships  . Social connections:    Talks on phone: Not on file    Gets together: Not on file    Attends religious service: Not on file    Active member of club or organization: Not on file    Attends meetings of clubs or organizations: Not on file    Relationship status: Not on file  Other Topics Concern  . Not on file  Social History Narrative   Married, lives with spouse. Retired  Licensed conveyancer, Print production planner. Has 3 kids (moved to [place] to be closer)- youngest son MD. Dorie Rank to [place] from [place] 06/2010, lived in [place] x 10years    Family History  Problem Relation Age of Onset  . Hypertension Mother   . Alzheimer's disease Mother 74  . AAA (abdominal aortic aneurysm) Mother   . Stroke Father 34  . Kidney cancer Brother        mets  . Bone cancer Brother 18  . Colon cancer Neg Hx     Review of Systems  Constitutional: Negative for  chills and fever.  Respiratory: Positive for shortness of breath (DOE - inclines). Negative for cough and wheezing.   Cardiovascular: Positive for chest pain. Negative for palpitations and leg swelling.  Musculoskeletal: Positive for arthralgias.  Neurological: Positive for headaches (migraines).       Objective:   Vitals:   05/02/19 0925  BP: 134/72  Pulse: (!) 58  Resp: 16  Temp: 98.3 F (36.8 C)  SpO2: 96%   BP Readings from Last 3 Encounters:  05/02/19 134/72  04/08/19 134/70  02/25/19 (!) 150/72   Wt Readings from Last 3 Encounters:  05/02/19 146 lb 12.8 oz (66.6 kg)  04/08/19 138 lb (62.6 kg)  02/25/19 142 lb 1.9 oz (64.5 kg)   Body mass index is 26 kg/m.   Physical Exam    Constitutional: Appears well-developed and well-nourished. No distress.  HENT:  Head: Normocephalic and atraumatic.  Neck: Neck supple. No tracheal deviation present. No thyromegaly present.  No cervical lymphadenopathy Cardiovascular: Normal rate, regular rhythm and normal heart sounds.   No murmur heard. No carotid bruit .  No edema Pulmonary/Chest: Effort normal and breath sounds normal. No respiratory distress. No has no wheezes. No rales.  Skin: Skin is warm and dry. Not diaphoretic.  Psychiatric: Normal mood and affect. Behavior is normal.      Assessment & Plan:    See Problem List for Assessment and Plan of chronic medical problems.

## 2019-05-02 ENCOUNTER — Encounter: Payer: Self-pay | Admitting: Internal Medicine

## 2019-05-02 ENCOUNTER — Ambulatory Visit (INDEPENDENT_AMBULATORY_CARE_PROVIDER_SITE_OTHER): Payer: PPO | Admitting: Internal Medicine

## 2019-05-02 ENCOUNTER — Other Ambulatory Visit: Payer: Self-pay

## 2019-05-02 ENCOUNTER — Other Ambulatory Visit (INDEPENDENT_AMBULATORY_CARE_PROVIDER_SITE_OTHER): Payer: PPO

## 2019-05-02 VITALS — BP 134/72 | HR 58 | Temp 98.3°F | Resp 16 | Ht 63.0 in | Wt 146.8 lb

## 2019-05-02 DIAGNOSIS — R0789 Other chest pain: Secondary | ICD-10-CM

## 2019-05-02 DIAGNOSIS — K219 Gastro-esophageal reflux disease without esophagitis: Secondary | ICD-10-CM | POA: Diagnosis not present

## 2019-05-02 DIAGNOSIS — I10 Essential (primary) hypertension: Secondary | ICD-10-CM

## 2019-05-02 DIAGNOSIS — G43809 Other migraine, not intractable, without status migrainosus: Secondary | ICD-10-CM

## 2019-05-02 DIAGNOSIS — R7303 Prediabetes: Secondary | ICD-10-CM

## 2019-05-02 DIAGNOSIS — I83813 Varicose veins of bilateral lower extremities with pain: Secondary | ICD-10-CM

## 2019-05-02 DIAGNOSIS — R0609 Other forms of dyspnea: Secondary | ICD-10-CM

## 2019-05-02 DIAGNOSIS — E782 Mixed hyperlipidemia: Secondary | ICD-10-CM

## 2019-05-02 DIAGNOSIS — R0600 Dyspnea, unspecified: Secondary | ICD-10-CM

## 2019-05-02 HISTORY — DX: Prediabetes: R73.03

## 2019-05-02 LAB — LIPID PANEL
Cholesterol: 167 mg/dL (ref 0–200)
HDL: 50.7 mg/dL (ref >39.00)
LDL Cholesterol: 82 mg/dL (ref 0–99)
NonHDL: 116.42
Total CHOL/HDL Ratio: 3
Triglycerides: 171 mg/dL — ABNORMAL HIGH (ref 0.0–149.0)
VLDL: 34.2 mg/dL (ref 0.0–40.0)

## 2019-05-02 LAB — CBC WITH DIFFERENTIAL/PLATELET
Basophils Absolute: 0 10*3/uL (ref 0.0–0.1)
Basophils Relative: 0.5 % (ref 0.0–3.0)
Eosinophils Absolute: 0.3 10*3/uL (ref 0.0–0.7)
Eosinophils Relative: 3.2 % (ref 0.0–5.0)
HCT: 40.8 % (ref 36.0–46.0)
Hemoglobin: 13.9 g/dL (ref 12.0–15.0)
Lymphocytes Relative: 26.4 % (ref 12.0–46.0)
Lymphs Abs: 2.1 10*3/uL (ref 0.7–4.0)
MCHC: 34.1 g/dL (ref 30.0–36.0)
MCV: 100.1 fl — ABNORMAL HIGH (ref 78.0–100.0)
Monocytes Absolute: 0.7 10*3/uL (ref 0.1–1.0)
Monocytes Relative: 9 % (ref 3.0–12.0)
Neutro Abs: 4.9 10*3/uL (ref 1.4–7.7)
Neutrophils Relative %: 60.9 % (ref 43.0–77.0)
Platelets: 215 10*3/uL (ref 150.0–400.0)
RBC: 4.08 Mil/uL (ref 3.87–5.11)
RDW: 13.2 % (ref 11.5–15.5)
WBC: 8.1 10*3/uL (ref 4.0–10.5)

## 2019-05-02 LAB — COMPREHENSIVE METABOLIC PANEL
ALT: 13 U/L (ref 0–35)
AST: 17 U/L (ref 0–37)
Albumin: 4.2 g/dL (ref 3.5–5.2)
Alkaline Phosphatase: 52 U/L (ref 39–117)
BUN: 23 mg/dL (ref 6–23)
CO2: 29 mEq/L (ref 19–32)
Calcium: 9.6 mg/dL (ref 8.4–10.5)
Chloride: 104 mEq/L (ref 96–112)
Creatinine, Ser: 0.9 mg/dL (ref 0.40–1.20)
GFR: 61.22 mL/min (ref >60.00)
Glucose, Bld: 95 mg/dL (ref 70–99)
Potassium: 4.8 mEq/L (ref 3.5–5.1)
Sodium: 140 mEq/L (ref 135–145)
Total Bilirubin: 0.5 mg/dL (ref 0.2–1.2)
Total Protein: 6.9 g/dL (ref 6.0–8.3)

## 2019-05-02 LAB — TSH: TSH: 0.45 u[IU]/mL (ref 0.35–4.50)

## 2019-05-02 LAB — HEMOGLOBIN A1C: Hgb A1c MFr Bld: 5.7 % (ref 4.6–6.5)

## 2019-05-02 NOTE — Patient Instructions (Addendum)
Try light compression socks  - you can get them at a medical supply store or order them online ([employer] or [place]).     We will look into prolia for your bones.   Tests ordered today. Your results will be released to [place] (or called to you) after review, usually within 72hours after test completion. If any changes need to be made, you will be notified at that same time.   Medications reviewed and updated.  Changes include :   none   Please followup in 6 months

## 2019-05-02 NOTE — Assessment & Plan Note (Signed)
Has dyspnea with inclines Can walk in a flat surface without difficulty Does have a follow-up with cardiology planned and may have further ischemic work-up

## 2019-05-02 NOTE — Assessment & Plan Note (Signed)
Takes Tylenol, if this is not effective she will take Fioricet, which is very effective Continue

## 2019-05-02 NOTE — Assessment & Plan Note (Signed)
Did have a virtual visit with cardiology last month-unlikely cardiac in nature Her chest pain is reproducible and she does have tenderness when palpating her sternum-possibly costochondritis Discussed symptomatic treatment

## 2019-05-02 NOTE — Assessment & Plan Note (Signed)
Is taking very small of amount of statin-has weaned herself off Wonders if she still needs to take it or not Check lipid panel although she is not completely fasting

## 2019-05-02 NOTE — Assessment & Plan Note (Signed)
GERD controlled Continue daily medication  

## 2019-05-02 NOTE — Assessment & Plan Note (Signed)
Check a1c Low sugar / carb diet Stressed regular exercise   

## 2019-05-02 NOTE — Telephone Encounter (Signed)
Have you checked on her coverage - she is interested in prolia.

## 2019-05-02 NOTE — Assessment & Plan Note (Signed)
Varicose veins bilateral lower extremities She denies swelling, but does get discomfort in her lower legs of the end of the day She will try light-medium compression socks

## 2019-05-03 NOTE — Telephone Encounter (Signed)
We are still waiting on response from insurance co---we are reaching out to [facility] for assistance

## 2019-05-03 NOTE — Telephone Encounter (Signed)
Insurance has been submitted and verified. $255 coapy. Called and informed patient. She is going to look into [employer] for assistance with the copay and will follow up when that has been done.

## 2019-05-04 ENCOUNTER — Encounter: Payer: Self-pay | Admitting: Internal Medicine

## 2019-05-04 NOTE — Telephone Encounter (Signed)
Appt scheduled

## 2019-05-10 ENCOUNTER — Other Ambulatory Visit: Payer: Self-pay

## 2019-05-10 ENCOUNTER — Ambulatory Visit: Payer: PPO

## 2019-05-10 DIAGNOSIS — M810 Age-related osteoporosis without current pathological fracture: Secondary | ICD-10-CM

## 2019-05-10 MED ORDER — DENOSUMAB 60 MG/ML ~~LOC~~ SOSY
60.0000 mg | PREFILLED_SYRINGE | Freq: Once | SUBCUTANEOUS | Status: AC
  Administered 2019-05-10: 60 mg via SUBCUTANEOUS

## 2019-05-10 NOTE — Progress Notes (Signed)
prolia Injection given.   [name] Meleski J Kohen Reither, MD  

## 2019-05-11 DIAGNOSIS — M67911 Unspecified disorder of synovium and tendon, right shoulder: Secondary | ICD-10-CM | POA: Diagnosis not present

## 2019-05-11 DIAGNOSIS — M545 Low back pain: Secondary | ICD-10-CM | POA: Diagnosis not present

## 2019-05-17 DIAGNOSIS — M6281 Muscle weakness (generalized): Secondary | ICD-10-CM | POA: Diagnosis not present

## 2019-05-17 DIAGNOSIS — S39012D Strain of muscle, fascia and tendon of lower back, subsequent encounter: Secondary | ICD-10-CM | POA: Diagnosis not present

## 2019-05-18 DIAGNOSIS — M6281 Muscle weakness (generalized): Secondary | ICD-10-CM | POA: Diagnosis not present

## 2019-05-18 DIAGNOSIS — S39012D Strain of muscle, fascia and tendon of lower back, subsequent encounter: Secondary | ICD-10-CM | POA: Diagnosis not present

## 2019-05-24 DIAGNOSIS — M25511 Pain in right shoulder: Secondary | ICD-10-CM | POA: Diagnosis not present

## 2019-05-24 NOTE — Telephone Encounter (Signed)
Opened in error

## 2019-05-25 DIAGNOSIS — M6281 Muscle weakness (generalized): Secondary | ICD-10-CM | POA: Diagnosis not present

## 2019-05-25 DIAGNOSIS — S39012D Strain of muscle, fascia and tendon of lower back, subsequent encounter: Secondary | ICD-10-CM | POA: Diagnosis not present

## 2019-05-30 DIAGNOSIS — S39012D Strain of muscle, fascia and tendon of lower back, subsequent encounter: Secondary | ICD-10-CM | POA: Diagnosis not present

## 2019-05-30 DIAGNOSIS — M6281 Muscle weakness (generalized): Secondary | ICD-10-CM | POA: Diagnosis not present

## 2019-06-01 DIAGNOSIS — M75121 Complete rotator cuff tear or rupture of right shoulder, not specified as traumatic: Secondary | ICD-10-CM | POA: Diagnosis not present

## 2019-06-01 DIAGNOSIS — M24811 Other specific joint derangements of right shoulder, not elsewhere classified: Secondary | ICD-10-CM | POA: Diagnosis not present

## 2019-06-03 DIAGNOSIS — S39012D Strain of muscle, fascia and tendon of lower back, subsequent encounter: Secondary | ICD-10-CM | POA: Diagnosis not present

## 2019-06-03 DIAGNOSIS — M6281 Muscle weakness (generalized): Secondary | ICD-10-CM | POA: Diagnosis not present

## 2019-06-07 ENCOUNTER — Telehealth: Payer: Self-pay

## 2019-06-07 NOTE — Telephone Encounter (Signed)
   [facility] Pre-operative Risk Assessment    Request for surgical clearance:  1. What type of surgery is being performed? RIGHT SHOULDER ARTHROSCOPIC ROTATOR CUFF REPAIR    2. When is this surgery scheduled? TBD   3. What type of clearance is required (medical clearance vs. Pharmacy clearance to hold med vs. Both)? MEDICAL  4. Are there any medications that need to be held prior to surgery and how long? NONE   5. Practice name and name of physician performing surgery? [facility] Tania Ade, MD  ATTN: [place]   6. What is your office phone number [phone]    7.   What is your office fax number [phone]  8.   Anesthesia type (None, local, MAC, general) ?     Waylan Rocher 06/07/2019, 4:08 PM  _________________________________________________________________   (provider comments below)

## 2019-06-08 NOTE — Telephone Encounter (Signed)
LM for pt to call back X 2 now.

## 2019-06-10 NOTE — Telephone Encounter (Signed)
Left message to call back on her home phone. I was unable to leave a message on her cell-mailbox full.  Kerin Ransom PA-C 06/10/2019 8:44 AM

## 2019-06-13 NOTE — Telephone Encounter (Signed)
LMTCB

## 2019-06-13 NOTE — Telephone Encounter (Signed)
   Primary Cardiologist: No primary care provider on file.  Chart reviewed as part of pre-operative protocol coverage. Patient was contacted 06/13/2019 in reference to pre-operative risk assessment for pending surgery as outlined below.  Wade Sigala was last seen on 04/08/2019 by Dr. Gwenlyn Found.  Since that day, [name] has done well form a cardiac perspective. She denies recurrence of her dyspnea and she has had no chest pain. She reports that her previous chest discomfort was likely related to her shoulder. There was no exertional quality and Dr. Gwenlyn Found felt this was not cardiac. She had a negative workup in 2011. We discussed close follow up after her procedure. She understands and agrees.   Therefore, based on ACC/AHA guidelines, the patient would be at acceptable risk for the planned procedure without further cardiovascular testing.   I will route this recommendation to the requesting party via Epic fax function and remove from pre-op pool.  Please call with questions.  Kathyrn Drown, NP 06/13/2019, 3:18 PM

## 2019-06-15 ENCOUNTER — Other Ambulatory Visit: Payer: Self-pay | Admitting: Orthopedic Surgery

## 2019-06-19 ENCOUNTER — Other Ambulatory Visit: Payer: Self-pay | Admitting: Internal Medicine

## 2019-06-20 NOTE — Telephone Encounter (Signed)
Please advise, was d/ced in Feb. But last lipid panel was abnormal.

## 2019-07-12 DIAGNOSIS — M75121 Complete rotator cuff tear or rupture of right shoulder, not specified as traumatic: Secondary | ICD-10-CM | POA: Diagnosis not present

## 2019-07-12 DIAGNOSIS — M7551 Bursitis of right shoulder: Secondary | ICD-10-CM | POA: Diagnosis not present

## 2019-07-12 DIAGNOSIS — M7541 Impingement syndrome of right shoulder: Secondary | ICD-10-CM | POA: Diagnosis not present

## 2019-07-12 DIAGNOSIS — M7521 Bicipital tendinitis, right shoulder: Secondary | ICD-10-CM | POA: Diagnosis not present

## 2019-07-12 DIAGNOSIS — M659 Synovitis and tenosynovitis, unspecified: Secondary | ICD-10-CM | POA: Diagnosis not present

## 2019-07-12 DIAGNOSIS — M19011 Primary osteoarthritis, right shoulder: Secondary | ICD-10-CM | POA: Diagnosis not present

## 2019-07-12 DIAGNOSIS — G8918 Other acute postprocedural pain: Secondary | ICD-10-CM | POA: Diagnosis not present

## 2019-07-18 ENCOUNTER — Encounter (HOSPITAL_BASED_OUTPATIENT_CLINIC_OR_DEPARTMENT_OTHER): Payer: Self-pay

## 2019-07-18 ENCOUNTER — Ambulatory Visit (HOSPITAL_BASED_OUTPATIENT_CLINIC_OR_DEPARTMENT_OTHER): Admit: 2019-07-18 | Payer: PPO | Admitting: Orthopedic Surgery

## 2019-07-18 SURGERY — SHOULDER ARTHROSCOPY WITH SUBACROMIAL DECOMPRESSION
Anesthesia: Choice | Site: Shoulder | Laterality: Right

## 2019-07-20 DIAGNOSIS — Z9889 Other specified postprocedural states: Secondary | ICD-10-CM | POA: Diagnosis not present

## 2019-07-25 DIAGNOSIS — M25611 Stiffness of right shoulder, not elsewhere classified: Secondary | ICD-10-CM | POA: Diagnosis not present

## 2019-07-25 DIAGNOSIS — M75121 Complete rotator cuff tear or rupture of right shoulder, not specified as traumatic: Secondary | ICD-10-CM | POA: Diagnosis not present

## 2019-07-27 DIAGNOSIS — M25611 Stiffness of right shoulder, not elsewhere classified: Secondary | ICD-10-CM | POA: Diagnosis not present

## 2019-07-27 DIAGNOSIS — M75121 Complete rotator cuff tear or rupture of right shoulder, not specified as traumatic: Secondary | ICD-10-CM | POA: Diagnosis not present

## 2019-08-02 ENCOUNTER — Encounter: Payer: Self-pay | Admitting: Internal Medicine

## 2019-08-02 DIAGNOSIS — M75121 Complete rotator cuff tear or rupture of right shoulder, not specified as traumatic: Secondary | ICD-10-CM | POA: Diagnosis not present

## 2019-08-02 DIAGNOSIS — M25611 Stiffness of right shoulder, not elsewhere classified: Secondary | ICD-10-CM | POA: Diagnosis not present

## 2019-08-04 DIAGNOSIS — M25611 Stiffness of right shoulder, not elsewhere classified: Secondary | ICD-10-CM | POA: Diagnosis not present

## 2019-08-04 DIAGNOSIS — M75121 Complete rotator cuff tear or rupture of right shoulder, not specified as traumatic: Secondary | ICD-10-CM | POA: Diagnosis not present

## 2019-08-09 DIAGNOSIS — M25611 Stiffness of right shoulder, not elsewhere classified: Secondary | ICD-10-CM | POA: Diagnosis not present

## 2019-08-09 DIAGNOSIS — M75121 Complete rotator cuff tear or rupture of right shoulder, not specified as traumatic: Secondary | ICD-10-CM | POA: Diagnosis not present

## 2019-08-11 DIAGNOSIS — M25611 Stiffness of right shoulder, not elsewhere classified: Secondary | ICD-10-CM | POA: Diagnosis not present

## 2019-08-11 DIAGNOSIS — M75121 Complete rotator cuff tear or rupture of right shoulder, not specified as traumatic: Secondary | ICD-10-CM | POA: Diagnosis not present

## 2019-08-16 DIAGNOSIS — M75121 Complete rotator cuff tear or rupture of right shoulder, not specified as traumatic: Secondary | ICD-10-CM | POA: Diagnosis not present

## 2019-08-16 DIAGNOSIS — M25611 Stiffness of right shoulder, not elsewhere classified: Secondary | ICD-10-CM | POA: Diagnosis not present

## 2019-08-18 DIAGNOSIS — M75121 Complete rotator cuff tear or rupture of right shoulder, not specified as traumatic: Secondary | ICD-10-CM | POA: Diagnosis not present

## 2019-08-18 DIAGNOSIS — M25611 Stiffness of right shoulder, not elsewhere classified: Secondary | ICD-10-CM | POA: Diagnosis not present

## 2019-08-22 DIAGNOSIS — M75121 Complete rotator cuff tear or rupture of right shoulder, not specified as traumatic: Secondary | ICD-10-CM | POA: Diagnosis not present

## 2019-08-22 DIAGNOSIS — M25611 Stiffness of right shoulder, not elsewhere classified: Secondary | ICD-10-CM | POA: Diagnosis not present

## 2019-08-24 DIAGNOSIS — Z9889 Other specified postprocedural states: Secondary | ICD-10-CM | POA: Diagnosis not present

## 2019-08-30 DIAGNOSIS — M25611 Stiffness of right shoulder, not elsewhere classified: Secondary | ICD-10-CM | POA: Diagnosis not present

## 2019-08-30 DIAGNOSIS — M75121 Complete rotator cuff tear or rupture of right shoulder, not specified as traumatic: Secondary | ICD-10-CM | POA: Diagnosis not present

## 2019-09-01 DIAGNOSIS — M75121 Complete rotator cuff tear or rupture of right shoulder, not specified as traumatic: Secondary | ICD-10-CM | POA: Diagnosis not present

## 2019-09-01 DIAGNOSIS — M25611 Stiffness of right shoulder, not elsewhere classified: Secondary | ICD-10-CM | POA: Diagnosis not present

## 2019-09-05 DIAGNOSIS — M75121 Complete rotator cuff tear or rupture of right shoulder, not specified as traumatic: Secondary | ICD-10-CM | POA: Diagnosis not present

## 2019-09-05 DIAGNOSIS — M25611 Stiffness of right shoulder, not elsewhere classified: Secondary | ICD-10-CM | POA: Diagnosis not present

## 2019-09-13 DIAGNOSIS — M25611 Stiffness of right shoulder, not elsewhere classified: Secondary | ICD-10-CM | POA: Diagnosis not present

## 2019-09-13 DIAGNOSIS — M75121 Complete rotator cuff tear or rupture of right shoulder, not specified as traumatic: Secondary | ICD-10-CM | POA: Diagnosis not present

## 2019-09-20 DIAGNOSIS — M25611 Stiffness of right shoulder, not elsewhere classified: Secondary | ICD-10-CM | POA: Diagnosis not present

## 2019-09-20 DIAGNOSIS — M75121 Complete rotator cuff tear or rupture of right shoulder, not specified as traumatic: Secondary | ICD-10-CM | POA: Diagnosis not present

## 2019-09-26 DIAGNOSIS — M75121 Complete rotator cuff tear or rupture of right shoulder, not specified as traumatic: Secondary | ICD-10-CM | POA: Diagnosis not present

## 2019-09-26 DIAGNOSIS — M25611 Stiffness of right shoulder, not elsewhere classified: Secondary | ICD-10-CM | POA: Diagnosis not present

## 2019-09-30 DIAGNOSIS — Z9889 Other specified postprocedural states: Secondary | ICD-10-CM | POA: Diagnosis not present

## 2019-10-05 DIAGNOSIS — M25611 Stiffness of right shoulder, not elsewhere classified: Secondary | ICD-10-CM | POA: Diagnosis not present

## 2019-10-05 DIAGNOSIS — M75121 Complete rotator cuff tear or rupture of right shoulder, not specified as traumatic: Secondary | ICD-10-CM | POA: Diagnosis not present

## 2019-10-11 DIAGNOSIS — M75121 Complete rotator cuff tear or rupture of right shoulder, not specified as traumatic: Secondary | ICD-10-CM | POA: Diagnosis not present

## 2019-10-11 DIAGNOSIS — M25611 Stiffness of right shoulder, not elsewhere classified: Secondary | ICD-10-CM | POA: Diagnosis not present

## 2019-11-01 NOTE — Patient Instructions (Addendum)
Call and schedule your mammogram -  [facility] 7 a.m.-6:30 p.m., Monday 7 a.m.-5 p.m., Tuesday-Friday Schedule an appointment by calling [phone]    Tests ordered today. Your results will be released to [place] (or called to you) after review.  If any changes need to be made, you will be notified at that same time.  Flu immunization administered today.    Medications reviewed and updated.  Changes include :   none    Please followup in 6 months

## 2019-11-01 NOTE — Progress Notes (Signed)
Subjective:    Patient ID: [name] [name], female    DOB: January 20, 1946, 73 y.o.   MRN: [id number]  HPI The patient is here for follow up.  She is exercising regularly - walking    Right ear bothering her for a couple of weeks.  ? Clogged with wax.  She denies ear pain  She is still experiencing right shoulder pain since she had surgery almost 5 months ago.  It is improving and she has done PT.  She most likely will need to have the left shoulder done.  Hypertension: She is taking her medication daily. She is compliant with a low sodium diet.  She denies chest pain, palpitations, edema, shortness of breath and regular headaches.     Hyperlipidemia: She is taking her medication daily. She is compliant with a low fat/cholesterol diet. She denies myalgias.    GERD:  She is taking her medication daily as prescribed.  She denies any GERD symptoms and feels her GERD is well controlled.   Migraines:  She takes tylenol as needed and fioricet if the tylenol is not effective.  She only has occasional left sided headaches-they have improved.  Medication is effective.  Prediabetes:  She is compliant with a low sugar/carbohydrate diet.  She is exercising regularly.   Medications and allergies reviewed with patient and updated if appropriate.  Patient Active Problem List   Diagnosis Date Noted  . Prediabetes 05/02/2019  . Dyspnea on exertion 04/08/2019  . Carbuncle of groin 02/25/2019  . Headache behind the eyes 06/03/2018  . Skin abnormalities 05/07/2018  . Bilateral shoulder pain 12/11/2017  . Gastritis 12/11/2017  . Trigger finger, right little finger 09/11/2017  . Numbness and tingling in left hand 07/20/2017  . Dupuytren's contracture of hand 07/20/2017  . CTS (carpal tunnel syndrome) 07/17/2017  . Bilateral carpal tunnel syndrome 07/17/2017  . Arthralgia 06/30/2017  . Paresthesia 06/10/2017  . Leg cramps 06/10/2017  . 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 Chronic  midline low back pain without sciatica 09/15/2016  . Osteopenia 07/20/2016  . Bilateral finger numbness 06/03/2016  . Joint pain 06/03/2016  . Atypical chest pain 02/09/2015  . Hyperlipidemia 02/09/2015  . Bilateral primary osteoarthritis of knee 10/03/2013  . Lateral meniscal tear 10/03/2013  . 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 Varicose vein of leg   . Allergic rhinitis   . IBS (irritable bowel syndrome) 04/07/2011  . Epigastric pain 12/26/2010  . Migraine headache 09/27/2010  . Essential hypertension 09/27/2010  . GERD 09/27/2010  . RENAL CALCULUS, HX OF 09/27/2010    Current Outpatient Medications on File Prior to Visit  Medication Sig Dispense Refill  . atorvastatin (LIPITOR) 40 MG tablet Take 1/2 (one-half) tablet by mouth once daily 45 tablet 1  . butalbital-acetaminophen-caffeine (FIORICET, ESGIC) 50-325-40 MG tablet TAKE ONE TABLET BY MOUTH EVERY 4 HOURS AS NEEDED FOR HEADACHE 30 tablet 0  . calcium carbonate (OS-CAL) 600 MG tablet Take 1 tablet (600 mg total) by mouth daily. 60 tablet   . Cholecalciferol (VITAMIN D3) 1000 units CAPS Take 1,000 Units by mouth 3 (three) times a week.     . cyanocobalamin 1000 MCG tablet Take 1,000 mcg by mouth daily.    Marland Kitchen dicyclomine (BENTYL) 10 MG capsule Take 1 capsule (10 mg total) by mouth every 8 (eight) hours as needed for spasms. Please schedule an office visit for further refills: [phone] 30 capsule 0  . losartan (COZAAR) 100 MG tablet Take by mouth daily.  Pt takes 50 mg daily    . metoprolol succinate (TOPROL-XL) 50 MG 24 hr tablet Take 1 tablet by mouth once daily 90 tablet 1  . pantoprazole (PROTONIX) 40 MG tablet Take 1 tablet (40 mg total) by mouth daily. 90 tablet 3   No current facility-administered medications on file prior to visit.     Past Medical History:  Diagnosis Date  . Arthritis   . Atypical chest pain   . Diverticulosis   . Gastritis 11/08/2007, 04/12/2012   H pylori bx neg 03/2012  . GERD  (gastroesophageal reflux disease) 11/08/2007, 04/12/2012  . Hepatic cyst   . Hiatal hernia 11/08/2007, 04/12/2012  . History of kidney stones   . Hyperlipidemia   . Hypertension   . IBS (irritable bowel syndrome)    diarrhea predom  . Migraine   . Osteoporosis 09/27/2010   DEXA 01/21/2012: -2.1 spine and R fem, -1.8 L fem s/p fosamax  2004-2011 DEXA 04/18/14 @ [facility]: -2.5 - rec to start Prolia    . PVC's (premature ventricular contractions)   . 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 Shingles outbreak 04/2013    Past Surgical History:  Procedure Laterality Date  . APPENDECTOMY  1958  . CYSTOCELE REPAIR  2008  . Maxilofacial  1992  . TONSILLECTOMY  1960    Social History   Socioeconomic History  . Marital status: Married    Spouse name: [name]  . Number of children: 2  . Years of education: Not on file  . Highest education level: Master's degree (e.g., MA, MS, MEng, MEd, MSW, MBA)  Occupational History  . Occupation: retired  Scientific laboratory technician  . Financial resource strain: Not on file  . Food insecurity    Worry: Not on file    Inability: Not on file  . Transportation needs    Medical: Not on file    Non-medical: Not on file  Tobacco Use  . Smoking status: Never Smoker  . Smokeless tobacco: Never Used  Substance and Sexual Activity  . Alcohol use: No  . Drug use: No  . Sexual activity: Not on file  Lifestyle  . Physical activity    Days per week: Not on file    Minutes per session: Not on file  . Stress: Not on file  Relationships  . Social Herbalist on phone: Not on file    Gets together: Not on file    Attends religious service: Not on file    Active member of club or organization: Not on file    Attends meetings of clubs or organizations: Not on file    Relationship status: Not on file  Other Topics Concern  . Not on file  Social History Narrative   Married, lives with spouse. Retired Licensed conveyancer, Print production planner. Has 3  kids (moved to [place] to be closer)- youngest son MD. Dorie Rank to [place] from [place] 06/2010, lived in [place] x 10years    Family History  Problem Relation Age of Onset  . Hypertension Mother   . Alzheimer's disease Mother 34  . AAA (abdominal aortic aneurysm) Mother   . Stroke Father 97  . Kidney cancer Brother        mets  . Bone cancer Brother 28  . Colon cancer Neg Hx     Review of Systems  Constitutional: Negative for chills and fever.  Respiratory: Negative for cough, shortness of breath and wheezing.   Cardiovascular: Negative for chest pain,  palpitations and leg swelling.  Gastrointestinal: Negative for abdominal pain and nausea.  Neurological: Positive for headaches (very occasional).       Objective:   Vitals:   11/02/19 0848  BP: 134/72  Pulse: 71  Resp: 16  Temp: 98.3 F (36.8 C)  SpO2: 96%   BP Readings from Last 3 Encounters:  11/02/19 134/72  05/02/19 134/72  04/08/19 134/70   Wt Readings from Last 3 Encounters:  11/02/19 138 lb 1.9 oz (62.7 kg)  05/02/19 146 lb 12.8 oz (66.6 kg)  04/08/19 138 lb (62.6 kg)   Body mass index is 24.47 kg/m.   Physical Exam    Constitutional: Appears well-developed and well-nourished. No distress.  HENT:  Head: Normocephalic and atraumatic.  Neck: Neck supple. No tracheal deviation present. No thyromegaly present.  No cervical lymphadenopathy Cardiovascular: Normal rate, regular rhythm and normal heart sounds.   No murmur heard. No carotid bruit .  No edema Pulmonary/Chest: Effort normal and breath sounds normal. No respiratory distress. No has no wheezes. No rales.  Skin: Skin is warm and dry. Not diaphoretic.  Psychiatric: Normal mood and affect. Behavior is normal.      Assessment & Plan:    See Problem List for Assessment and Plan of chronic medical problems.

## 2019-11-02 ENCOUNTER — Other Ambulatory Visit: Payer: Self-pay

## 2019-11-02 ENCOUNTER — Other Ambulatory Visit (INDEPENDENT_AMBULATORY_CARE_PROVIDER_SITE_OTHER): Payer: PPO

## 2019-11-02 ENCOUNTER — Ambulatory Visit (INDEPENDENT_AMBULATORY_CARE_PROVIDER_SITE_OTHER): Payer: PPO | Admitting: Internal Medicine

## 2019-11-02 ENCOUNTER — Encounter: Payer: Self-pay | Admitting: Internal Medicine

## 2019-11-02 VITALS — BP 134/72 | HR 71 | Temp 98.3°F | Resp 16 | Ht 63.0 in | Wt 138.1 lb

## 2019-11-02 DIAGNOSIS — Z1231 Encounter for screening mammogram for malignant neoplasm of breast: Secondary | ICD-10-CM | POA: Diagnosis not present

## 2019-11-02 DIAGNOSIS — G43809 Other migraine, not intractable, without status migrainosus: Secondary | ICD-10-CM

## 2019-11-02 DIAGNOSIS — Z23 Encounter for immunization: Secondary | ICD-10-CM | POA: Diagnosis not present

## 2019-11-02 DIAGNOSIS — K219 Gastro-esophageal reflux disease without esophagitis: Secondary | ICD-10-CM | POA: Diagnosis not present

## 2019-11-02 DIAGNOSIS — M8589 Other specified disorders of bone density and structure, multiple sites: Secondary | ICD-10-CM | POA: Diagnosis not present

## 2019-11-02 DIAGNOSIS — R7303 Prediabetes: Secondary | ICD-10-CM

## 2019-11-02 DIAGNOSIS — E782 Mixed hyperlipidemia: Secondary | ICD-10-CM | POA: Diagnosis not present

## 2019-11-02 DIAGNOSIS — I10 Essential (primary) hypertension: Secondary | ICD-10-CM

## 2019-11-02 LAB — LIPID PANEL
Cholesterol: 144 mg/dL (ref 0–200)
HDL: 43.8 mg/dL (ref >39.00)
NonHDL: 100.5
Total CHOL/HDL Ratio: 3
Triglycerides: 299 mg/dL — ABNORMAL HIGH (ref 0.0–149.0)
VLDL: 59.8 mg/dL — ABNORMAL HIGH (ref 0.0–40.0)

## 2019-11-02 LAB — COMPREHENSIVE METABOLIC PANEL
ALT: 13 U/L (ref 0–35)
AST: 17 U/L (ref 0–37)
Albumin: 4.1 g/dL (ref 3.5–5.2)
Alkaline Phosphatase: 45 U/L (ref 39–117)
BUN: 21 mg/dL (ref 6–23)
CO2: 29 mEq/L (ref 19–32)
Calcium: 9.4 mg/dL (ref 8.4–10.5)
Chloride: 104 mEq/L (ref 96–112)
Creatinine, Ser: 0.78 mg/dL (ref 0.40–1.20)
GFR: 72.11 mL/min (ref >60.00)
Glucose, Bld: 83 mg/dL (ref 70–99)
Potassium: 3.9 mEq/L (ref 3.5–5.1)
Sodium: 139 mEq/L (ref 135–145)
Total Bilirubin: 0.5 mg/dL (ref 0.2–1.2)
Total Protein: 6.7 g/dL (ref 6.0–8.3)

## 2019-11-02 LAB — LDL CHOLESTEROL, DIRECT: Direct LDL: 75 mg/dL

## 2019-11-02 LAB — HEMOGLOBIN A1C: Hgb A1c MFr Bld: 5.6 % (ref 4.6–6.5)

## 2019-11-02 NOTE — Assessment & Plan Note (Signed)
Check a1c Low sugar / carb diet Stressed regular exercise   

## 2019-11-02 NOTE — Assessment & Plan Note (Addendum)
Getting prolia injection - not yet due Continue walking Taking calcium and vitamin d DEXA up-to-date

## 2019-11-02 NOTE — Assessment & Plan Note (Signed)
Migraines have improved-only having them occasionally Takes Tylenol or Fioricet Continue above

## 2019-11-02 NOTE — Assessment & Plan Note (Signed)
GERD controlled Continue daily medication  

## 2019-11-02 NOTE — Assessment & Plan Note (Signed)
Check lipid panel  Continue daily statin Regular exercise and healthy diet encouraged  

## 2019-11-02 NOTE — Assessment & Plan Note (Signed)
BP well controlled Current regimen effective and well tolerated Continue current medications at current doses CMP 

## 2019-11-03 ENCOUNTER — Encounter: Payer: Self-pay | Admitting: Internal Medicine

## 2019-11-14 DIAGNOSIS — M25611 Stiffness of right shoulder, not elsewhere classified: Secondary | ICD-10-CM | POA: Diagnosis not present

## 2019-11-14 DIAGNOSIS — M67912 Unspecified disorder of synovium and tendon, left shoulder: Secondary | ICD-10-CM | POA: Diagnosis not present

## 2019-11-15 ENCOUNTER — Ambulatory Visit (INDEPENDENT_AMBULATORY_CARE_PROVIDER_SITE_OTHER)
Admission: RE | Admit: 2019-11-15 | Discharge: 2019-11-15 | Disposition: A | Discharge status: Home / Self Care | Payer: PPO | Source: Ambulatory Visit | Attending: Internal Medicine | Admitting: Internal Medicine

## 2019-11-15 ENCOUNTER — Ambulatory Visit (INDEPENDENT_AMBULATORY_CARE_PROVIDER_SITE_OTHER): Payer: PPO | Admitting: Internal Medicine

## 2019-11-15 ENCOUNTER — Encounter: Payer: Self-pay | Admitting: Internal Medicine

## 2019-11-15 ENCOUNTER — Other Ambulatory Visit: Payer: Self-pay

## 2019-11-15 DIAGNOSIS — G44319 Acute post-traumatic headache, not intractable: Secondary | ICD-10-CM

## 2019-11-15 DIAGNOSIS — S0990XA Unspecified injury of head, initial encounter: Secondary | ICD-10-CM | POA: Diagnosis not present

## 2019-11-15 DIAGNOSIS — D329 Benign neoplasm of meninges, unspecified: Secondary | ICD-10-CM | POA: Diagnosis not present

## 2019-11-15 HISTORY — DX: Acute post-traumatic headache, not intractable: G44.319

## 2019-11-15 NOTE — Patient Instructions (Signed)
A CT of the head was ordered.   Start gabapentin 100 mg at bedtime.  If it does not make you too drowsy take one pill twice daily.    Take tylenol as needed.    Update me via mychart on your symptoms.

## 2019-11-15 NOTE — Assessment & Plan Note (Signed)
Hit the left zygomatic bone 3 days ago and has localized pain there and bruising there-unlikely that she fractured the bone Left-sided headache-burning in nature, constant. Headache has gotten worse Not on blood thinners, but given worsening headache will get a CT of the head Has taking gabapentin in the past but has some at home-start 100 mg at night, increase to twice daily if it does not make her too tired Tylenol as needed for pain She will update me on her symptoms so we can adjust medication

## 2019-11-15 NOTE — Progress Notes (Signed)
Subjective:    Patient ID: [name] [name], female    DOB: [birth year], 73 y.o.   MRN: [id number]  HPI The patient is here for an acute visit.  3 days ago she was cooking and something went off in the kitchen and she ran back to try to get it quickly.  Her husband was running back at the same time and they collided.  She ended up hitting her left cheek on the corner of the doorway.  She did not lose consciousness.  She has a bruise on her left cheek and it is tender there.  She did ice this for 30 minutes after.  Her entire left side of her head has a burning type pain that has been constant.  She states this pain does feel like it superficial.  She states her headaches have gotten worse.  She has a degree of photophobia.  She denies any nausea or confusion.  She has taken Tylenol and aspirin with minimal improvement.      Medications and allergies reviewed with patient and updated if appropriate.  Patient Active Problem List   Diagnosis Date Noted  . Prediabetes 05/02/2019  . Carbuncle of groin 02/25/2019  . Skin abnormalities 05/07/2018  . Bilateral shoulder pain 12/11/2017  . Gastritis 12/11/2017  . Trigger finger, right little finger 09/11/2017  . Numbness and tingling in left hand 07/20/2017  . Dupuytren's contracture of hand 07/20/2017  . CTS (carpal tunnel syndrome) 07/17/2017  . Bilateral carpal tunnel syndrome 07/17/2017  . Arthralgia 06/30/2017  . Leg cramps 06/10/2017  . 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 Chronic midline low back pain without sciatica 09/15/2016  . Osteopenia 07/20/2016  . Bilateral finger numbness 06/03/2016  . Joint pain 06/03/2016  . Atypical chest pain 02/09/2015  . Hyperlipidemia 02/09/2015  . Bilateral primary osteoarthritis of knee 10/03/2013  . Lateral meniscal tear 10/03/2013  . 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 Varicose vein of leg   . Allergic rhinitis   . IBS (irritable bowel syndrome) 04/07/2011  .  Epigastric pain 12/26/2010  . Migraine headache 09/27/2010  . Essential hypertension 09/27/2010  . GERD 09/27/2010  . RENAL CALCULUS, HX OF 09/27/2010    Current Outpatient Medications on File Prior to Visit  Medication Sig Dispense Refill  . atorvastatin (LIPITOR) 40 MG tablet Take 1/2 (one-half) tablet by mouth once daily 45 tablet 1  . butalbital-acetaminophen-caffeine (FIORICET, ESGIC) 50-325-40 MG tablet TAKE ONE TABLET BY MOUTH EVERY 4 HOURS AS NEEDED FOR HEADACHE 30 tablet 0  . calcium carbonate (OS-CAL) 600 MG tablet Take 1 tablet (600 mg total) by mouth daily. 60 tablet   . Cholecalciferol (VITAMIN D3) 1000 units CAPS Take 1,000 Units by mouth 3 (three) times a week.     . cyanocobalamin 1000 MCG tablet Take 1,000 mcg by mouth daily.    Marland Kitchen dicyclomine (BENTYL) 10 MG capsule Take 1 capsule (10 mg total) by mouth every 8 (eight) hours as needed for spasms. Please schedule an office visit for further refills: [phone] 30 capsule 0  . losartan (COZAAR) 100 MG tablet Take by mouth daily. Pt takes 50 mg daily    . metoprolol succinate (TOPROL-XL) 50 MG 24 hr tablet Take 1 tablet by mouth once daily 90 tablet 1  . pantoprazole (PROTONIX) 40 MG tablet Take 1 tablet (40 mg total) by mouth daily. 90 tablet 3   No current facility-administered medications on file prior to visit.    Past Medical History:  Diagnosis Date  . Arthritis   . Atypical chest pain   . Diverticulosis   . Gastritis 11/08/2007, 04/12/2012   H pylori bx neg 03/2012  . GERD (gastroesophageal reflux disease) 11/08/2007, 04/12/2012  . Hepatic cyst   . Hiatal hernia 11/08/2007, 04/12/2012  . History of kidney stones   . Hyperlipidemia   . Hypertension   . IBS (irritable bowel syndrome)    diarrhea predom  . Migraine   . Osteoporosis 09/27/2010   DEXA 01/21/2012: -2.1 spine and R fem, -1.8 L fem s/p fosamax  2004-2011 DEXA 04/18/14 @ [facility]: -2.5 - rec to start Prolia    . PVC's (premature ventricular contractions)   .  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 Shingles outbreak 04/2013    Past Surgical History:  Procedure Laterality Date  . APPENDECTOMY  1958  . CYSTOCELE REPAIR  2008  . Maxilofacial  1992  . TONSILLECTOMY  1960    Social History   Socioeconomic History  . Marital status: Married    Spouse name: [name]  . Number of children: 2  . Years of education: Not on file  . Highest education level: Master's degree (e.g., MA, MS, MEng, MEd, MSW, MBA)  Occupational History  . Occupation: retired  Tobacco Use  . Smoking status: Never Smoker  . Smokeless tobacco: Never Used  Substance and Sexual Activity  . Alcohol use: No  . Drug use: No  . Sexual activity: Not on file  Other Topics Concern  . Not on file  Social History Narrative   Married, lives with spouse. Retired Licensed conveyancer, Print production planner. Has 3 kids (moved to [place] to be closer)- youngest son MD. Dorie Rank to [place] from [place] 06/2010, lived in [place] x 10years   Social Determinants of Health   Financial Resource Strain:   . Difficulty of Paying Living Expenses: Not on file  Food Insecurity:   . Worried About Charity fundraiser in the Last Year: Not on file  . Ran Out of Food in the Last Year: Not on file  Transportation Needs:   . Lack of Transportation (Medical): Not on file  . Lack of Transportation (Non-Medical): Not on file  Physical Activity:   . Days of Exercise per Week: Not on file  . Minutes of Exercise per Session: Not on file  Stress:   . Feeling of Stress : Not on file  Social Connections:   . Frequency of Communication with Friends and Family: Not on file  . Frequency of Social Gatherings with Friends and Family: Not on file  . Attends Religious Services: Not on file  . Active Member of Clubs or Organizations: Not on file  . Attends Archivist Meetings: Not on file  . Marital Status: Not on file    Family History  Problem Relation Age of Onset  . Hypertension Mother    . Alzheimer's disease Mother 34  . AAA (abdominal aortic aneurysm) Mother   . Stroke Father 72  . Kidney cancer Brother        mets  . Bone cancer Brother 31  . Colon cancer Neg Hx     Review of Systems  Constitutional: Positive for fatigue.  Eyes: Positive for photophobia.  Gastrointestinal: Negative for nausea.  Neurological: Positive for dizziness, light-headedness and headaches.       Objective:   Vitals:   11/15/19 1100  BP: 132/64  Pulse: 68  Resp: 16  Temp: 99.8 F (37.7 C)  SpO2: 97%   BP Readings from Last 3 Encounters:  11/15/19 132/64  11/02/19 134/72  05/02/19 134/72   Wt Readings from Last 3 Encounters:  11/15/19 138 lb (62.6 kg)  11/02/19 138 lb 1.9 oz (62.7 kg)  05/02/19 146 lb 12.8 oz (66.6 kg)   Body mass index is 24.45 kg/m.   Physical Exam Constitutional:      General: She is not in acute distress.    Appearance: Normal appearance. She is not ill-appearing.  HENT:     Head: Normocephalic and atraumatic.     Comments: Left zygomatic bone intact without deformity, mildly tender Musculoskeletal:     Right lower leg: No edema.     Left lower leg: No edema.  Skin:    General: Skin is warm and dry.     Findings: Bruising (Mild left cheek) present.  Neurological:     General: No focal deficit present.     Mental Status: She is alert and oriented to person, place, and time.     Cranial Nerves: No cranial nerve deficit.     Sensory: No sensory deficit.     Motor: No weakness.     Comments: Sensitivity left scalp  Psychiatric:        Mood and Affect: Mood normal.        Behavior: Behavior normal.        Thought Content: Thought content normal.        Judgment: Judgment normal.           Assessment & Plan:    See Problem List for Assessment and Plan of chronic medical problems.    This visit occurred during the SARS-CoV-2 public health emergency.  Safety protocols were in place, including screening questions prior to the visit,  additional usage of staff PPE, and extensive cleaning of exam room while observing appropriate contact time as indicated for disinfecting solutions.

## 2019-11-20 ENCOUNTER — Encounter: Payer: Self-pay | Admitting: Internal Medicine

## 2019-11-28 ENCOUNTER — Ambulatory Visit
Admission: RE | Admit: 2019-11-28 | Discharge: 2019-11-28 | Disposition: A | Discharge status: Home / Self Care | Payer: PPO | Source: Ambulatory Visit | Attending: Internal Medicine | Admitting: Internal Medicine

## 2019-11-28 DIAGNOSIS — Z1231 Encounter for screening mammogram for malignant neoplasm of breast: Secondary | ICD-10-CM | POA: Diagnosis not present

## 2019-12-24 ENCOUNTER — Other Ambulatory Visit: Payer: Self-pay | Admitting: Internal Medicine

## 2019-12-27 ENCOUNTER — Telehealth: Payer: Self-pay | Admitting: Internal Medicine

## 2019-12-27 NOTE — Telephone Encounter (Signed)
    Spouse calling to request Prolia injection  CB [phone]

## 2019-12-28 NOTE — Telephone Encounter (Signed)
Spouse and patient calling to schedule Prolia injection. Confirmed that patient still has [employer]. Patient is aware of the $250 copay, due at anytime. Scheduled for 12/30/2019 for prolia injection.

## 2019-12-28 NOTE — Telephone Encounter (Signed)
Called and left message for spouse to call back to schedule. Just need to confirm that insurance benefits are the same as last year ([employer]). If so, pt would be responsible for a $250 copay. Due anytime. Okay to schedule on the nurse schedule.

## 2019-12-30 ENCOUNTER — Ambulatory Visit (INDEPENDENT_AMBULATORY_CARE_PROVIDER_SITE_OTHER): Payer: PPO | Admitting: *Deleted

## 2019-12-30 ENCOUNTER — Other Ambulatory Visit: Payer: Self-pay

## 2019-12-30 DIAGNOSIS — M810 Age-related osteoporosis without current pathological fracture: Secondary | ICD-10-CM | POA: Diagnosis not present

## 2019-12-30 MED ORDER — DENOSUMAB 60 MG/ML ~~LOC~~ SOSY
60.0000 mg | PREFILLED_SYRINGE | Freq: Once | SUBCUTANEOUS | Status: AC
  Administered 2019-12-30: 60 mg via SUBCUTANEOUS

## 2020-01-01 NOTE — Progress Notes (Signed)
Prolia Injection given.   Catalia Massett J Humzah Harty, MD  

## 2020-01-30 ENCOUNTER — Other Ambulatory Visit: Payer: Self-pay

## 2020-01-30 ENCOUNTER — Ambulatory Visit (INDEPENDENT_AMBULATORY_CARE_PROVIDER_SITE_OTHER): Payer: PPO | Admitting: Family Medicine

## 2020-01-30 VITALS — BP 120/72 | HR 53 | Ht 63.0 in | Wt 139.0 lb

## 2020-01-30 DIAGNOSIS — M222X1 Patellofemoral disorders, right knee: Secondary | ICD-10-CM | POA: Diagnosis not present

## 2020-01-30 DIAGNOSIS — M25511 Pain in right shoulder: Secondary | ICD-10-CM

## 2020-01-30 DIAGNOSIS — M25512 Pain in left shoulder: Secondary | ICD-10-CM | POA: Diagnosis not present

## 2020-01-30 DIAGNOSIS — M2550 Pain in unspecified joint: Secondary | ICD-10-CM

## 2020-01-30 MED ORDER — PREDNISONE 50 MG PO TABS: ORAL_TABLET | ORAL | 0 refills | Status: DC

## 2020-01-30 NOTE — Patient Instructions (Addendum)
CoQ10 200mg  daily with statin Prednisone do not take until 3 days after vaccine Tart Cherry 1200 mg at night Continue B Vitamin See me again in 3 weeks and we will see what still hurts

## 2020-01-30 NOTE — Progress Notes (Signed)
[place] Phone: [phone] Subjective:   [name] [name], am serving as a scribe for Dr. Hulan Saas. This visit occurred during the SARS-CoV-2 public health emergency.  Safety protocols were in place, including screening questions prior to the visit, additional usage of staff PPE, and extensive cleaning of exam room while observing appropriate contact time as indicated for disinfecting solutions.   I'm seeing this patient by the request  of:  Binnie Rail, MD  CC: Pain all over  [id number]   02/09/2019 Patient having more of a spasm recently.  He did have a recent bout of colitis as well.  Gabapentin given, 2 injections including Toradol and Depo-Medrol given.  Warned of potential small side effects.  Discussed icing regimen, home exercises given and work with Product/process development scientist.  Follow-up again in 4 weeks  Update 01/30/2020 [name] [name] is a 74 y.o. female coming in with complaint of bilateral shoulder pain. Had right shoulder surgery in August 2020. Pain over middle deltoid. Two months ago her pain returned especially with IR.  Patient states that the left may be greater than the right at the moment.  Patient is also complaining of bilateral knee pain and bilateral hip pain.  Seems to be more in the buttocks area. Knee pain seems to be diffuse.  Patient states she is increasing fatigue throughout the day as well.  States that by 3 PM feels like she is struggling today.  Patient does not know what else is kind of changed at the moment.  Denies any change in diet, denies any fevers chills or any abnormal weight loss recently.       Past Medical History:  Diagnosis Date  . Arthritis   . Atypical chest pain   . Diverticulosis   . Gastritis 11/08/2007, 04/12/2012   H pylori bx neg 03/2012  . GERD (gastroesophageal reflux disease) 11/08/2007, 04/12/2012  . Hepatic cyst   . Hiatal hernia 11/08/2007, 04/12/2012    . History of kidney stones   . Hyperlipidemia   . Hypertension   . IBS (irritable bowel syndrome)    diarrhea predom  . Migraine   . Osteoporosis 09/27/2010   DEXA 01/21/2012: -2.1 spine and R fem, -1.8 L fem s/p fosamax  2004-2011 DEXA 04/18/14 @ [facility]: -2.5 - rec to start Prolia    . PVC's (premature ventricular contractions)   . 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 Shingles outbreak 04/2013   Past Surgical History:  Procedure Laterality Date  . APPENDECTOMY  1958  . CYSTOCELE REPAIR  2008  . Maxilofacial  1992  . TONSILLECTOMY  1960   Social History   Socioeconomic History  . Marital status: Married    Spouse name: [name]  . Number of children: 2  . Years of education: Not on file  . Highest education level: Master's degree (e.g., MA, MS, MEng, MEd, MSW, MBA)  Occupational History  . Occupation: retired  Tobacco Use  . Smoking status: Never Smoker  . Smokeless tobacco: Never Used  Substance and Sexual Activity  . Alcohol use: [name]  . Drug use: [name]  . Sexual activity: Not on file  Other Topics Concern  . Not on file  Social History Narrative   Married, lives with spouse. Retired Licensed conveyancer, Print production planner. Has 3 kids (moved to [place] to be closer)- youngest son MD. Dorie Rank to [place] from [place] 06/2010, lived in [place] x 10years  Social Determinants of Health   Financial Resource Strain:   . Difficulty of Paying Living Expenses: Not on file  Food Insecurity:   . Worried About Charity fundraiser in the Last Year: Not on file  . Ran Out of Food in the Last Year: Not on file  Transportation Needs:   . Lack of Transportation (Medical): Not on file  . Lack of Transportation (Non-Medical): Not on file  Physical Activity:   . Days of Exercise per Week: Not on file  . Minutes of Exercise per Session: Not on file  Stress:   . Feeling of Stress : Not on file  Social Connections:   . Frequency of Communication with Friends and Family: Not on file   . Frequency of Social Gatherings with Friends and Family: Not on file  . Attends Religious Services: Not on file  . Active Member of Clubs or Organizations: Not on file  . Attends Archivist Meetings: Not on file  . Marital Status: Not on file   [name] Known Allergies Family History  Problem Relation Age of Onset  . Hypertension Mother   . Alzheimer's disease Mother 73  . AAA (abdominal aortic aneurysm) Mother   . Stroke Father 1  . Kidney cancer Brother        mets  . Bone cancer Brother 44  . Colon cancer Neg Hx     Current Outpatient Medications (Endocrine & Metabolic):  .  predniSONE (DELTASONE) 50 MG tablet, Take one tablet daily for the next 5 days.  Current Outpatient Medications (Cardiovascular):  .  atorvastatin (LIPITOR) 40 MG tablet, Take 1/2 (one-half) tablet by mouth once daily .  losartan (COZAAR) 100 MG tablet, Take by mouth daily. Pt takes 50 mg daily .  metoprolol succinate (TOPROL-XL) 50 MG 24 hr tablet, Take 1 tablet by mouth once daily   Current Outpatient Medications (Analgesics):  .  butalbital-acetaminophen-caffeine (FIORICET, ESGIC) 50-325-40 MG tablet, TAKE ONE TABLET BY MOUTH EVERY 4 HOURS AS NEEDED FOR HEADACHE  Current Outpatient Medications (Hematological):  .  cyanocobalamin 1000 MCG tablet, Take 1,000 mcg by mouth daily.  Current Outpatient Medications (Other):  .  calcium carbonate (OS-CAL) 600 MG tablet, Take 1 tablet (600 mg total) by mouth daily. .  Cholecalciferol (VITAMIN D3) 1000 units CAPS, Take 1,000 Units by mouth 3 (three) times a week.  .  dicyclomine (BENTYL) 10 MG capsule, Take 1 capsule (10 mg total) by mouth every 8 (eight) hours as needed for spasms. Please schedule an office visit for further refills: [phone] .  pantoprazole (PROTONIX) 40 MG tablet, Take 1 tablet by mouth once daily   Reviewed prior external information including notes and imaging from  primary care provider As well as notes that were available  from care everywhere and other healthcare systems.  Past medical history, social, surgical and family history all reviewed in electronic medical record.  [name] pertanent information unless stated regarding to the chief complaint.   Review of Systems:  [name] headache, visual changes, nausea, vomiting, diarrhea, constipation, dizziness, abdominal pain, skin rash, fevers, chills, night sweats, weight loss, swollen lymph nodes, , joint swelling, chest pain, shortness of breath, mood changes. POSITIVE muscle aches, body aches, fatigue  Objective  Blood pressure 120/72, pulse (!) 53, height 5\' 3"  (1.6 m), weight 139 lb (63 kg), SpO2 96 %.   General: [name] apparent distress alert and oriented x3 mood and affect normal, dressed appropriately.  HEENT: Pupils equal, extraocular movements intact  Respiratory: Patient's  speak in full sentences and does not appear short of breath  Cardiovascular: [name] lower extremity edema, non tender, [name] erythema  Skin: Warm dry intact with [name] signs of infection or rash on extremities or on axial skeleton.  Abdomen: Soft nontender  Neuro: Cranial nerves II through XII are intact, neurovascularly intact in all extremities with 2+ DTRs and 2+ pulses.  Lymph: [name] lymphadenopathy of posterior or anterior cervical chain or axillae bilaterally.  Gait mild antalgic MSK: Patient does have diffuse tenderness to palpation in the most musculature.  Patient is severely tender over the greater trochanteric area of the hips bilaterally paraspinal musculature of the back. Knee exam does have arthritic changes of the knees bilaterally.  Mild instability noted with valgus and varus force.  Near full range of motion though noted. Patient shoulder exam shows the patient does have positive impingement bilaterally.  4-5 strength of the rotator cuff bilaterally.  Patient does have full range of motion.     Impression and Recommendations:     This case required medical decision making of moderate  complexity. The above documentation has been reviewed and is accurate and complete Lyndal Pulley, DO       Note: This dictation was prepared with Dragon dictation along with smaller phrase technology. Any transcriptional errors that result from this process are unintentional.

## 2020-01-31 ENCOUNTER — Encounter: Payer: Self-pay | Admitting: Family Medicine

## 2020-01-31 NOTE — Assessment & Plan Note (Signed)
Seems to be multifactorial.  Possible bursitis but patient seems to have a potential inflammation everywhere.  Was also having increasing fatigue.  Seems to be more of a full body arthralgia.  We discussed the possibility of something like polymyositis rheumatica.  Laboratory work-up would be necessary but would not change management.  Patient will be put on prednisone for 5 days and warned of potential side effects.  Discussed though not taking the medication until at least 72 hours after immunization.  Would want to hold on any formal physical therapy at the moment until patient is fully vaccinated.  Is a social determinants of health.  Discussed icing regimen, home exercises.  Topical anti-inflammatories otherwise.  Follow-up again in 4 to 6 weeks

## 2020-01-31 NOTE — Assessment & Plan Note (Signed)
Reviewed anatomy using anatomical model and how PFS occurs.  Given rehab exercises handout for VMO, hip abductors, core, entire kinetic chain including proprioception exercises.  Could benefit from PT, regular exercise, upright biking, and a PFS knee brace to assist with tracking abnormalities.  

## 2020-02-03 ENCOUNTER — Ambulatory Visit: Payer: PPO

## 2020-02-03 ENCOUNTER — Ambulatory Visit: Payer: PPO | Attending: Internal Medicine

## 2020-02-03 DIAGNOSIS — Z23 Encounter for immunization: Secondary | ICD-10-CM

## 2020-02-03 NOTE — Progress Notes (Signed)
   Covid-19 Vaccination Clinic  Name:  Ludene Horgen    MRN: [id number] DOB: 10-07-1946  02/03/2020  Ms. Roffman was observed post Covid-19 immunization for 15 minutes without incident. She was provided with Vaccine Information Sheet and instruction to access the V-Safe system.   Ms. Wellendorf was instructed to call 911 with any severe reactions post vaccine: Marland Kitchen Difficulty breathing  . Swelling of face and throat  . A fast heartbeat  . A bad rash all over body  . Dizziness and weakness   Immunizations Administered    Name Date Dose VIS Date Route   Pfizer COVID-19 Vaccine 02/03/2020 11:18 AM 0.3 mL 11/04/2019 Intramuscular   Manufacturer: [place]   Lot: [id number]   [place]: [id number]

## 2020-02-20 ENCOUNTER — Other Ambulatory Visit: Payer: Self-pay

## 2020-02-20 ENCOUNTER — Encounter: Payer: Self-pay | Admitting: Family Medicine

## 2020-02-20 ENCOUNTER — Ambulatory Visit (INDEPENDENT_AMBULATORY_CARE_PROVIDER_SITE_OTHER): Payer: PPO | Admitting: Family Medicine

## 2020-02-20 VITALS — BP 138/84 | HR 80 | Ht 63.0 in | Wt 140.0 lb

## 2020-02-20 DIAGNOSIS — M170 Bilateral primary osteoarthritis of knee: Secondary | ICD-10-CM

## 2020-02-20 DIAGNOSIS — G8929 Other chronic pain: Secondary | ICD-10-CM

## 2020-02-20 DIAGNOSIS — M25511 Pain in right shoulder: Secondary | ICD-10-CM

## 2020-02-20 DIAGNOSIS — M25512 Pain in left shoulder: Secondary | ICD-10-CM | POA: Diagnosis not present

## 2020-02-20 DIAGNOSIS — M545 Low back pain, unspecified: Secondary | ICD-10-CM

## 2020-02-20 DIAGNOSIS — M2550 Pain in unspecified joint: Secondary | ICD-10-CM | POA: Diagnosis not present

## 2020-02-20 LAB — CBC WITH DIFFERENTIAL/PLATELET
Basophils Absolute: 0.1 10*3/uL (ref 0.0–0.1)
Basophils Relative: 0.8 % (ref 0.0–3.0)
Eosinophils Absolute: 0.3 10*3/uL (ref 0.0–0.7)
Eosinophils Relative: 4.1 % (ref 0.0–5.0)
HCT: 40.4 % (ref 36.0–46.0)
Hemoglobin: 13.6 g/dL (ref 12.0–15.0)
Lymphocytes Relative: 23.6 % (ref 12.0–46.0)
Lymphs Abs: 1.7 10*3/uL (ref 0.7–4.0)
MCHC: 33.7 g/dL (ref 30.0–36.0)
MCV: 99.5 fl (ref 78.0–100.0)
Monocytes Absolute: 0.7 10*3/uL (ref 0.1–1.0)
Monocytes Relative: 8.9 % (ref 3.0–12.0)
Neutro Abs: 4.6 10*3/uL (ref 1.4–7.7)
Neutrophils Relative %: 62.6 % (ref 43.0–77.0)
Platelets: 216 10*3/uL (ref 150.0–400.0)
RBC: 4.06 Mil/uL (ref 3.87–5.11)
RDW: 13.3 % (ref 11.5–15.5)
WBC: 7.4 10*3/uL (ref 4.0–10.5)

## 2020-02-20 LAB — TSH: TSH: 0.72 u[IU]/mL (ref 0.35–4.50)

## 2020-02-20 LAB — VITAMIN D 25 HYDROXY (VIT D DEFICIENCY, FRACTURES): VITD: 76.73 ng/mL (ref 30.00–100.00)

## 2020-02-20 LAB — COMPREHENSIVE METABOLIC PANEL
ALT: 14 U/L (ref 0–35)
AST: 19 U/L (ref 0–37)
Albumin: 4.1 g/dL (ref 3.5–5.2)
Alkaline Phosphatase: 37 U/L — ABNORMAL LOW (ref 39–117)
BUN: 21 mg/dL (ref 6–23)
CO2: 27 mEq/L (ref 19–32)
Calcium: 9.3 mg/dL (ref 8.4–10.5)
Chloride: 104 mEq/L (ref 96–112)
Creatinine, Ser: 0.84 mg/dL (ref 0.40–1.20)
GFR: 66.15 mL/min (ref >60.00)
Glucose, Bld: 92 mg/dL (ref 70–99)
Potassium: 3.9 mEq/L (ref 3.5–5.1)
Sodium: 139 mEq/L (ref 135–145)
Total Bilirubin: 0.5 mg/dL (ref 0.2–1.2)
Total Protein: 6.7 g/dL (ref 6.0–8.3)

## 2020-02-20 LAB — C-REACTIVE PROTEIN: CRP: <1 mg/dL (ref 0.5–20.0)

## 2020-02-20 LAB — FERRITIN: Ferritin: 48.1 ng/mL (ref 10.0–291.0)

## 2020-02-20 LAB — IBC PANEL
Iron: 111 ug/dL (ref 42–145)
Saturation Ratios: 40.2 % (ref 20.0–50.0)
Transferrin: 197 mg/dL — ABNORMAL LOW (ref 212.0–360.0)

## 2020-02-20 LAB — URIC ACID: Uric Acid, Serum: 3.8 mg/dL (ref 2.4–7.0)

## 2020-02-20 LAB — SEDIMENTATION RATE: Sed Rate: 19 mm/hr (ref 0–30)

## 2020-02-20 NOTE — Assessment & Plan Note (Signed)
Patient was having pain again.  At the moment secondary to the amount of steroids and due to patient size wanted to hold on injections.  I do feel laboratory work-up may show another reason why patient is having so much arthralgia.  If not when patient comes back we will consider injections again and could be a candidate for viscosupplementation in the long run.

## 2020-02-20 NOTE — Patient Instructions (Addendum)
Labs today See me again in 3-4 weeks and we will see if we need to do other injections at that time

## 2020-02-20 NOTE — Assessment & Plan Note (Signed)
Worsening pain again.  Encourage patient to take the gabapentin on a regular basis.  Refill given today  Discussed icing regimen and home exercise, which activities to do which wants to avoid.  Icing regimen, topical anti-inflammatories.  Follow-up again in 4 to 6 weeks.

## 2020-02-20 NOTE — Assessment & Plan Note (Signed)
Bilateral shoulder pain again.  Does have some mild decreased range of motion.  Does have likely arthritic changes of multiple joints and I do want to get laboratory work-up secondary to patient's arthralgia.  Tenderness findings this may change medical management.  Discussed topical anti-inflammatories, icing regimen.  Follow-up again in 3 to 4 weeks to follow-up on patient's knees.

## 2020-02-20 NOTE — Assessment & Plan Note (Signed)
Laboratory work-up ordered today

## 2020-02-20 NOTE — Progress Notes (Signed)
[place] Phone: [phone] Subjective:   [name] [name], am serving as a scribe for Dr. Hulan Saas. This visit occurred during the SARS-CoV-2 public health emergency.  Safety protocols were in place, including screening questions prior to the visit, additional usage of staff PPE, and extensive cleaning of exam room while observing appropriate contact time as indicated for disinfecting solutions.    I'm seeing this patient by the request  of:  Binnie Rail, MD  CC: Multiple complaints  [id number]   01/30/2020 Seems to be multifactorial.  Possible bursitis but patient seems to have a potential inflammation everywhere.  Was also having increasing fatigue.  Seems to be more of a full body arthralgia.  We discussed the possibility of something like polymyositis rheumatica.  Laboratory work-up would be necessary but would not change management.  Patient will be put on prednisone for 5 days and warned of potential side effects.  Discussed though not taking the medication until at least 72 hours after immunization.  Would want to hold on any formal physical therapy at the moment until patient is fully vaccinated.  Is a social determinants of health.  Discussed icing regimen, home exercises.  Topical anti-inflammatories otherwise.  Follow-up again in 4 to 6 weeks  Reviewed anatomy using anatomical model and how PFS occurs.  Given rehab exercises handout for VMO, hip abductors, core, entire kinetic chain including proprioception exercises.  Could benefit from PT, regular exercise, upright biking, and a PFS knee brace to assist with tracking abnormalities.  Update 02/20/2020 [name] [name] is a 74 y.o. female coming in with complaint of bilateral shoulder pain and right knee pain. Patient states that her pain is the same as last visit. Had about one week of relief in shoulders and knee.   Also having left proximal  hamstring pain.Unable to sit on left side for prolonged periods.  Patient states that everything just continues to hurt.  When she was on the prednisone it was the most improvement she is having quite some time but then 3 days afterwards pain came back immediately.  Would state everything the bilateral shoulders seems to be the most difficult at the moment.  Affecting daily activities.  Has had surgical intervention of the right shoulder previously.    Past Medical History:  Diagnosis Date  . Arthritis   . Atypical chest pain   . Diverticulosis   . Gastritis 11/08/2007, 04/12/2012   H pylori bx neg 03/2012  . GERD (gastroesophageal reflux disease) 11/08/2007, 04/12/2012  . Hepatic cyst   . Hiatal hernia 11/08/2007, 04/12/2012  . History of kidney stones   . Hyperlipidemia   . Hypertension   . IBS (irritable bowel syndrome)    diarrhea predom  . Migraine   . Osteoporosis 09/27/2010   DEXA 01/21/2012: -2.1 spine and R fem, -1.8 L fem s/p fosamax  2004-2011 DEXA 04/18/14 @ [facility]: -2.5 - rec to start Prolia    . PVC's (premature ventricular contractions)   . 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 Shingles outbreak 04/2013   Past Surgical History:  Procedure Laterality Date  . APPENDECTOMY  1958  . CYSTOCELE REPAIR  2008  . Maxilofacial  1992  . TONSILLECTOMY  1960   Social History   Socioeconomic History  . Marital status: Married    Spouse name: [name]  . Number of children: 2  . Years of education: Not on file  .  Highest education level: Master's degree (e.g., MA, MS, MEng, MEd, MSW, MBA)  Occupational History  . Occupation: retired  Tobacco Use  . Smoking status: Never Smoker  . Smokeless tobacco: Never Used  Substance and Sexual Activity  . Alcohol use: [name]  . Drug use: [name]  . Sexual activity: Not on file  Other Topics Concern  . Not on file  Social History Narrative   Married, lives with spouse. Retired Licensed conveyancer, Print production planner. Has 3 kids  (moved to [place] to be closer)- youngest son MD. Dorie Rank to [place] from [place] 06/2010, lived in [place] x 10years   Social Determinants of Health   Financial Resource Strain:   . Difficulty of Paying Living Expenses:   Food Insecurity:   . Worried About Charity fundraiser in the Last Year:   . Arboriculturist in the Last Year:   Transportation Needs:   . Film/video editor (Medical):   Marland Kitchen Lack of Transportation (Non-Medical):   Physical Activity:   . Days of Exercise per Week:   . Minutes of Exercise per Session:   Stress:   . Feeling of Stress :   Social Connections:   . Frequency of Communication with Friends and Family:   . Frequency of Social Gatherings with Friends and Family:   . Attends Religious Services:   . Active Member of Clubs or Organizations:   . Attends Archivist Meetings:   Marland Kitchen Marital Status:    [name] Known Allergies Family History  Problem Relation Age of Onset  . Hypertension Mother   . Alzheimer's disease Mother 34  . AAA (abdominal aortic aneurysm) Mother   . Stroke Father 69  . Kidney cancer Brother        mets  . Bone cancer Brother 25  . Colon cancer Neg Hx     Current Outpatient Medications (Endocrine & Metabolic):  .  predniSONE (DELTASONE) 50 MG tablet, Take one tablet daily for the next 5 days.  Current Outpatient Medications (Cardiovascular):  .  atorvastatin (LIPITOR) 40 MG tablet, Take 1/2 (one-half) tablet by mouth once daily .  losartan (COZAAR) 100 MG tablet, Take by mouth daily. Pt takes 50 mg daily .  metoprolol succinate (TOPROL-XL) 50 MG 24 hr tablet, Take 1 tablet by mouth once daily   Current Outpatient Medications (Analgesics):  .  butalbital-acetaminophen-caffeine (FIORICET, ESGIC) 50-325-40 MG tablet, TAKE ONE TABLET BY MOUTH EVERY 4 HOURS AS NEEDED FOR HEADACHE  Current Outpatient Medications (Hematological):  .  cyanocobalamin 1000 MCG tablet, Take 1,000 mcg by mouth daily.  Current Outpatient Medications (Other):  .   calcium carbonate (OS-CAL) 600 MG tablet, Take 1 tablet (600 mg total) by mouth daily. .  Cholecalciferol (VITAMIN D3) 1000 units CAPS, Take 1,000 Units by mouth 3 (three) times a week.  .  dicyclomine (BENTYL) 10 MG capsule, Take 1 capsule (10 mg total) by mouth every 8 (eight) hours as needed for spasms. Please schedule an office visit for further refills: [phone] .  pantoprazole (PROTONIX) 40 MG tablet, Take 1 tablet by mouth once daily   Reviewed prior external information including notes and imaging from  primary care provider As well as notes that were available from care everywhere and other healthcare systems.  Past medical history, social, surgical and family history all reviewed in electronic medical record.  [name] pertanent information unless stated regarding to the chief complaint.   Review of Systems:  [name] headache, visual changes, nausea, vomiting, diarrhea, constipation, dizziness, abdominal  pain, skin rash, fevers, chills, night sweats, weight loss, swollen lymph nodes, body aches, joint swelling, chest pain, shortness of breath, mood changes. POSITIVE muscle aches  Objective  Blood pressure 138/84, pulse 80, height 5\' 3"  (1.6 m), weight 140 lb (63.5 kg), SpO2 98 %.   General: [name] apparent distress alert and oriented x3 mood and affect normal, dressed appropriately.  Patient does appear to be somewhat stressed HEENT: Pupils equal, extraocular movements intact  Respiratory: Patient's speak in full sentences and does not appear short of breath  Cardiovascular: [name] lower extremity edema, non tender, [name] erythema  Neuro: Cranial nerves II through XII are intact, neurovascularly intact in all extremities with 2+ DTRs and 2+ pulses.  Gait antalgic MSK: Diffuse tenderness of multiple muscles and joints noted today.  Mild swelling of the left MCP joints noted today.  Shoulder shows some decreased range of motion with positive impingement.  Bilateral knee exam shows the patient does  have some lateral tracking noted.  Mild instability with valgus and varus force noted.  Patient's back and neck severe tenderness with patient continuing to have pain to even light palpation.  Patient does have decreased range of motion in all planes.  After informed written and verbal consent, patient was seated on exam table. Right shoulder was prepped with alcohol swab and utilizing posterior approach, patient's right glenohumeral space was injected with 4:1  marcaine 0.5%: Kenalog 40mg /dL. Patient tolerated the procedure well without immediate complications.  After informed written and verbal consent, patient was seated on exam table. Left shoulder was prepped with alcohol swab and utilizing posterior approach, patient's right glenohumeral space was injected with 4:1  marcaine 0.5%: Kenalog 40mg /dL. Patient tolerated the procedure well without immediate complications.    Impression and Recommendations:     This case required medical decision making of moderate complexity. The above documentation has been reviewed and is accurate and complete Lyndal Pulley, DO       Note: This dictation was prepared with Dragon dictation along with smaller phrase technology. Any transcriptional errors that result from this process are unintentional.

## 2020-02-23 LAB — CALCIUM, IONIZED: Calcium, Ion: 5.29 mg/dL (ref 4.8–5.6)

## 2020-02-23 LAB — CYCLIC CITRUL PEPTIDE ANTIBODY, IGG: Cyclic Citrullin Peptide Ab: <16 UNITS

## 2020-02-23 LAB — PTH, INTACT AND CALCIUM
Calcium: 9.7 mg/dL (ref 8.6–10.4)
PTH: 47 pg/mL (ref 14–64)

## 2020-02-23 LAB — RHEUMATOID FACTOR: Rheumatoid fact SerPl-aCnc: <14 IU/mL (ref <14)

## 2020-02-23 LAB — ANA: Anti Nuclear Antibody (ANA): NEGATIVE

## 2020-02-23 LAB — ANGIOTENSIN CONVERTING ENZYME: Angiotensin-Converting Enzyme: <5 U/L — ABNORMAL LOW (ref 9–67)

## 2020-02-23 LAB — SAR COV2 SEROLOGY (COVID19)AB(IGG),IA: SARS CoV2 AB IGG: POSITIVE — AB

## 2020-02-28 ENCOUNTER — Ambulatory Visit: Payer: PPO | Attending: Internal Medicine

## 2020-02-28 DIAGNOSIS — Z23 Encounter for immunization: Secondary | ICD-10-CM

## 2020-02-28 NOTE — Progress Notes (Signed)
   Covid-19 Vaccination Clinic  Name:  [name] [name]    MRN: [id number] DOB: 1946/09/09  02/28/2020  [name] [name] was observed post Covid-19 immunization for 15 minutes without incident. She was provided with Vaccine Information Sheet and instruction to access the V-Safe system.   [name] [name] was instructed to call 911 with any severe reactions post vaccine: Marland Kitchen Difficulty breathing  . Swelling of face and throat  . A fast heartbeat  . A bad rash all over body  . Dizziness and weakness   Immunizations Administered    Name Date Dose VIS Date Route   Pfizer COVID-19 Vaccine 02/28/2020  9:42 AM 0.3 mL 11/04/2019 Intramuscular   Manufacturer: [place]   Lot: [id number]   [place]: [id number]

## 2020-02-29 DIAGNOSIS — M67911 Unspecified disorder of synovium and tendon, right shoulder: Secondary | ICD-10-CM | POA: Diagnosis not present

## 2020-02-29 DIAGNOSIS — M67912 Unspecified disorder of synovium and tendon, left shoulder: Secondary | ICD-10-CM | POA: Diagnosis not present

## 2020-03-08 ENCOUNTER — Encounter: Payer: Self-pay | Admitting: Internal Medicine

## 2020-03-08 ENCOUNTER — Ambulatory Visit (INDEPENDENT_AMBULATORY_CARE_PROVIDER_SITE_OTHER): Payer: PPO | Admitting: Internal Medicine

## 2020-03-08 DIAGNOSIS — R197 Diarrhea, unspecified: Secondary | ICD-10-CM | POA: Diagnosis not present

## 2020-03-08 MED ORDER — AMOXICILLIN-POT CLAVULANATE 875-125 MG PO TABS: 1.0000 | ORAL_TABLET | Freq: Two times a day (BID) | ORAL | 0 refills | Status: DC

## 2020-03-08 MED ORDER — DICYCLOMINE HCL 10 MG PO CAPS: 10.0000 mg | ORAL_CAPSULE | Freq: Three times a day (TID) | ORAL | 0 refills | Status: DC | PRN

## 2020-03-08 NOTE — Progress Notes (Signed)
Virtual Visit via Video Note  I connected with [name] [name] on 03/08/20 at  9:30 AM EDT by a video enabled telemedicine application and verified that I am speaking with the correct person using two identifiers.   I discussed the limitations of evaluation and management by telemedicine and the availability of in person appointments. The patient expressed understanding and agreed to proceed.  Present for the visit:  Myself, [name], [name], her husband with there with her.  The patient is currently at home and I am in the office.    No referring provider.    History of Present Illness: This is an acute visit for diarrhea  She had the second covid vaccine on 4/6 and after than had increased indigestion and stomach upset.  She started having loosed stools around that time.  She is unsure if it is related or not.    The past several days her stools have gotten worse - they are watery and more frequent.  She tends to have several bowel movements in the morning and then in the afternoon she is okay.  She denies any blood in stool.  She does have associated abdominal pain and cramping, decreased appetite and weight loss.  She is also had headaches.  She has not had any fevers, chills or cold symptoms.  She did take dicyclomine and that did help.  Her blood pressure has been on the low side and she does feel it.  BP today 114/73.  She states her GERD is controlled.  She denies any dysphagia.     Review of Systems  Constitutional: Positive for weight loss. Negative for chills and fever.  Gastrointestinal: Positive for abdominal pain and diarrhea. Negative for blood in stool, heartburn, melena and nausea.  Musculoskeletal: Negative for myalgias.  Neurological: Positive for headaches.      Social History   Socioeconomic History  . Marital status: Married    Spouse name: [name]  . Number of children: 2  . Years of education: Not on file  . Highest education level:  Master's degree (e.g., MA, MS, MEng, MEd, MSW, MBA)  Occupational History  . Occupation: retired  Tobacco Use  . Smoking status: Never Smoker  . Smokeless tobacco: Never Used  Substance and Sexual Activity  . Alcohol use: No  . Drug use: No  . Sexual activity: Not on file  Other Topics Concern  . Not on file  Social History Narrative   Married, lives with spouse. Retired Licensed conveyancer, Print production planner. Has 3 kids (moved to [place] to be closer)- youngest son MD. Dorie Rank to [place] from [place] 06/2010, lived in [place] x 10years   Social Determinants of Health   Financial Resource Strain:   . Difficulty of Paying Living Expenses:   Food Insecurity:   . Worried About Charity fundraiser in the Last Year:   . Arboriculturist in the Last Year:   Transportation Needs:   . Film/video editor (Medical):   Marland Kitchen Lack of Transportation (Non-Medical):   Physical Activity:   . Days of Exercise per Week:   . Minutes of Exercise per Session:   Stress:   . Feeling of Stress :   Social Connections:   . Frequency of Communication with Friends and Family:   . Frequency of Social Gatherings with Friends and Family:   . Attends Religious Services:   . Active Member of Clubs or Organizations:   . Attends Archivist Meetings:   .  Marital Status:      Observations/Objective: Appears well in NAD Breathing normally Skin appears warm and dry  Assessment and Plan:  See Problem List for Assessment and Plan of chronic medical problems.   Follow Up Instructions:    I discussed the assessment and treatment plan with the patient. The patient was provided an opportunity to ask questions and all were answered. The patient agreed with the plan and demonstrated an understanding of the instructions.   The patient was advised to call back or seek an in-person evaluation if the symptoms worsen or if the condition fails to improve as anticipated.    Binnie Rail, MD

## 2020-03-08 NOTE — Assessment & Plan Note (Signed)
Acute Diarrhea-worsening, only in the morning, nonbloody Associated with abdominal pain and cramping, headache, decreased appetite and weight loss She had a similar episode 12/2017 and CT at that time showed probable infectious colitis, nondiverticulitis She was treated with Augmentin, which successfully resolved her symptoms We will start Augmentin twice daily x10 days If her symptoms do not improve over the next few days will need blood work and CT scan Increase fluids, bland diet She will call if no improvement or any concerns/questions

## 2020-03-09 ENCOUNTER — Other Ambulatory Visit: Payer: Self-pay | Admitting: Internal Medicine

## 2020-03-12 ENCOUNTER — Ambulatory Visit: Payer: PPO | Admitting: Family Medicine

## 2020-03-12 ENCOUNTER — Encounter: Payer: Self-pay | Admitting: Internal Medicine

## 2020-03-14 ENCOUNTER — Ambulatory Visit: Payer: PPO | Attending: Internal Medicine

## 2020-03-14 DIAGNOSIS — Z20822 Contact with and (suspected) exposure to covid-19: Secondary | ICD-10-CM | POA: Diagnosis not present

## 2020-03-16 LAB — SARS-COV-2, NAA 2 DAY TAT

## 2020-03-16 LAB — NOVEL CORONAVIRUS, NAA: SARS-CoV-2, NAA: NOT DETECTED

## 2020-03-28 ENCOUNTER — Ambulatory Visit: Payer: PPO | Attending: Internal Medicine

## 2020-03-28 DIAGNOSIS — Z20822 Contact with and (suspected) exposure to covid-19: Secondary | ICD-10-CM

## 2020-03-29 LAB — NOVEL CORONAVIRUS, NAA: SARS-CoV-2, NAA: NOT DETECTED

## 2020-03-29 LAB — SARS-COV-2, NAA 2 DAY TAT

## 2020-04-24 ENCOUNTER — Ambulatory Visit (INDEPENDENT_AMBULATORY_CARE_PROVIDER_SITE_OTHER): Payer: PPO

## 2020-04-24 ENCOUNTER — Other Ambulatory Visit: Payer: Self-pay

## 2020-04-24 VITALS — BP 120/60 | HR 63 | Temp 98.3°F | Resp 16 | Ht 63.0 in | Wt 139.0 lb

## 2020-04-24 DIAGNOSIS — Z0000 Encounter for general adult medical examination without abnormal findings: Secondary | ICD-10-CM | POA: Diagnosis not present

## 2020-04-24 NOTE — Progress Notes (Signed)
Subjective:   [name] [name] is a 74 y.o. female who presents for Medicare Annual (Subsequent) preventive examination.  Review of Systems:  No ROS. Medicare Wellness Visit Cardiac Risk Factors include: advanced age (>17men, >40 women);dyslipidemia;family history of premature cardiovascular disease;hypertension     Objective:     Vitals: BP 120/60 (BP Location: Right Arm, Patient Position: Sitting, Cuff Size: Normal)   Pulse 63   Temp 98.3 F (36.8 C)   Resp 16   Ht 5\' 3"  (1.6 m)   Wt 139 lb (63 kg)   SpO2 96%   BMI 24.62 kg/m   Body mass index is 24.62 kg/m.  Advanced Directives 04/24/2020  Does Patient Have a Medical Advance Directive? No  Would patient like information on creating a medical advance directive? No - Patient declined    Tobacco Social History   Tobacco Use  Smoking Status Never Smoker  Smokeless Tobacco Never Used     Counseling given: Not Answered   Clinical Intake:  Pre-visit preparation completed: Yes  Pain : 0-10 Pain Score: 7  Pain Location: Neck Pain Orientation: Right, Left Pain Radiating Towards: right and left shoulders; also has left heip pain radiating to lower ext (Sees Dr. Hulan Saas) Pain Descriptors / Indicators: Constant, Discomfort Pain Onset: More than a month ago Pain Frequency: Constant Pain Relieving Factors: Tylenol  Pain Relieving Factors: Tylenol  BMI - recorded: 24.62 Nutritional Status: BMI of 19-24  Normal Nutritional Risks: None Diabetes: No  What is the last grade level you completed in school?: College; Retired Control and instrumentation engineer Needed?: No  Information entered by :: [employer]. [employer], LPN  Past Medical History:  Diagnosis Date  . Arthritis   . Atypical chest pain   . Diverticulosis   . Gastritis 11/08/2007, 04/12/2012   H pylori bx neg 03/2012  . GERD (gastroesophageal reflux disease) 11/08/2007, 04/12/2012  . Hepatic cyst   . Hiatal hernia 11/08/2007, 04/12/2012  . History of kidney  stones   . Hyperlipidemia   . Hypertension   . IBS (irritable bowel syndrome)    diarrhea predom  . Migraine   . Osteoporosis 09/27/2010   DEXA 01/21/2012: -2.1 spine and R fem, -1.8 L fem s/p fosamax  2004-2011 DEXA 04/18/14 @ [facility]: -2.5 - rec to start Prolia    . PVC's (premature ventricular contractions)   . 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 Shingles outbreak 04/2013   Past Surgical History:  Procedure Laterality Date  . APPENDECTOMY  1958  . CYSTOCELE REPAIR  2008  . Maxilofacial  1992  . TONSILLECTOMY  1960   Family History  Problem Relation Age of Onset  . Hypertension Mother   . Alzheimer's disease Mother 49  . AAA (abdominal aortic aneurysm) Mother   . Stroke Father 60  . Kidney cancer Brother        mets  . Bone cancer Brother 65  . Colon cancer Neg Hx    Social History   Socioeconomic History  . Marital status: Married    Spouse name: [name] [name]  . Number of children: 2  . Years of education: Not on file  . Highest education level: Master's degree (e.g., MA, MS, MEng, MEd, MSW, MBA)  Occupational History  . Occupation: retired  Tobacco Use  . Smoking status: Never Smoker  . Smokeless tobacco: Never Used  Substance and Sexual Activity  . Alcohol use: No  . Drug use: No  . Sexual activity: Not on file  Other Topics Concern  . Not on file  Social History Narrative   Married, lives with spouse. Retired Licensed conveyancer, Print production planner. Has 3 kids (moved to [place] to be closer)- youngest son MD. Dorie Rank to [place] from [place] 06/2010, lived in [place] x 10years   Social Determinants of Health   Financial Resource Strain:   . Difficulty of Paying Living Expenses:   Food Insecurity:   . Worried About Charity fundraiser in the Last Year:   . Arboriculturist in the Last Year:   Transportation Needs:   . Film/video editor (Medical):   Marland Kitchen Lack of Transportation (Non-Medical):   Physical Activity:   . Days of Exercise per Week:   .  Minutes of Exercise per Session:   Stress:   . Feeling of Stress :   Social Connections:   . Frequency of Communication with Friends and Family:   . Frequency of Social Gatherings with Friends and Family:   . Attends Religious Services:   . Active Member of Clubs or Organizations:   . Attends Archivist Meetings:   Marland Kitchen Marital Status:     Outpatient Encounter Medications as of 04/24/2020  Medication Sig  . atorvastatin (LIPITOR) 40 MG tablet Take 1/2 (one-half) tablet by mouth once daily  . butalbital-acetaminophen-caffeine (FIORICET) 50-325-40 MG tablet TAKE 1 TABLET BY MOUTH EVERY 4 HOURS AS NEEDED FOR HEADACHE  . calcium carbonate (OS-CAL) 600 MG tablet Take 1 tablet (600 mg total) by mouth daily.  . Cholecalciferol (VITAMIN D3) 1000 units CAPS Take 1,000 Units by mouth 3 (three) times a week.   . cyanocobalamin 1000 MCG tablet Take 1,000 mcg by mouth daily.  Marland Kitchen dicyclomine (BENTYL) 10 MG capsule Take 1 capsule (10 mg total) by mouth every 8 (eight) hours as needed for spasms. Please schedule an office visit for further refills: [phone]  . losartan (COZAAR) 100 MG tablet Take 1 tablet by mouth once daily  . metoprolol succinate (TOPROL-XL) 50 MG 24 hr tablet Take 1 tablet by mouth once daily  . pantoprazole (PROTONIX) 40 MG tablet Take 1 tablet by mouth once daily  . amoxicillin-clavulanate (AUGMENTIN) 875-125 MG tablet Take 1 tablet by mouth 2 (two) times daily. (Patient not taking: Reported on 04/24/2020)   No facility-administered encounter medications on file as of 04/24/2020.    Activities of Daily Living In your present state of health, do you have any difficulty performing the following activities: 04/24/2020  Hearing? N  Vision? N  Difficulty concentrating or making decisions? Y  Walking or climbing stairs? N  Dressing or bathing? N  Doing errands, shopping? N  Preparing Food and eating ? N  Using the Toilet? N  In the past six months, have you accidently leaked  urine? N  Do you have problems with loss of bowel control? N  Managing your Medications? N  Managing your Finances? N  Housekeeping or managing your Housekeeping? N  Some recent data might be hidden    Patient Care Team: Binnie Rail, MD as PCP - General (Internal Medicine) Biagio Borg, MD as PCP - Family Medicine (Internal Medicine) Sable Feil, MD as Consulting Physician (Gastroenterology) Lyndal Pulley, DO (Sports Medicine) Lorretta Harp, MD (Cardiology) Pieter Partridge, DO as Consulting Physician (Neurology)    Assessment:   This is a routine wellness examination for [place].  Exercise Activities and Dietary recommendations Current Exercise Habits: The patient does not participate in regular exercise at present(likes to work  in the yard/gardening), Exercise limited by: orthopedic condition(s);Other - see comments(has chronic pain)  Goals   None     Fall Risk Fall Risk  04/24/2020 11/02/2019 05/02/2019 01/20/2019 07/20/2017  Falls in the past year? 0 0 0 0 No  Number falls in past yr: 0 0 0 - -  Injury with Fall? 0 - - - -  Risk for fall due to : Impaired balance/gait;Orthopedic patient - - - -  Follow up Falls evaluation completed;Education provided;Falls prevention discussed - - Falls evaluation completed -   Is the patient's home free of loose throw rugs in walkways, pet beds, electrical cords, etc?   yes      Grab bars in the bathroom? yes      Handrails on the stairs?   yes      Adequate lighting?   yes  Timed Get Up and Go performed: not indicated  Depression Screen PHQ 2/9 Scores 04/24/2020 05/02/2019 07/20/2017 06/03/2016  PHQ - 2 Score 0 0 0 0     Cognitive Function: not indicated        Immunization History  Administered Date(s) Administered  . Fluad Quad(high Dose 65+) 11/02/2019  . Influenza Split 10/13/2011, 08/24/2012  . Influenza Whole 09/27/2010  . Influenza, High Dose Seasonal PF 10/02/2017  . Influenza,inj,Quad PF,6+ Mos 08/01/2013,  10/30/2015  . Influenza-Unspecified 09/22/2016  . PFIZER SARS-COV-2 Vaccination 02/03/2020, 02/28/2020  . Pneumococcal Conjugate-13 04/16/2015  . Pneumococcal Polysaccharide-23 01/14/2012  . Tetanus 01/14/2012    Qualifies for Shingles Vaccine? Yes  Screening Tests Health Maintenance  Topic Date Due  . INFLUENZA VACCINE  06/24/2020  . DEXA SCAN  02/07/2021  . TETANUS/TDAP  01/13/2022  . COLONOSCOPY  04/12/2022  . COVID-19 Vaccine  Completed  . Hepatitis C Screening  Completed  . PNA vac Low Risk Adult  Completed    Cancer Screenings: Lung: Low Dose CT Chest recommended if Age 33-80 years, 30 pack-year currently smoking OR have quit w/in 15years. Patient does not qualify. Breast:  Up to date on Mammogram? Yes   Up to date of Bone Density/Dexa? Yes Colorectal: Yes  Additional Screenings: Hepatitis C Screening: completed     Plan:    Reviewed health maintenance screenings with patient today and relevant education, vaccines, and/or referrals were provided.    Continue doing brain stimulating activities (puzzles, reading, adult coloring books, staying active) to keep memory sharp.    Continue to eat heart healthy diet (full of fruits, vegetables, whole grains, lean protein, water--limit salt, fat, and sugar intake) and increase physical activity as tolerated.  I have personally reviewed and noted the following in the patient's chart:   . Medical and social history . Use of alcohol, tobacco or illicit drugs  . Current medications and supplements . Functional ability and status . Nutritional status . Physical activity . Advanced directives . List of other physicians . Hospitalizations, surgeries, and ER visits in previous 12 months . Vitals . Screenings to include cognitive, depression, and falls . Referrals and appointments  In addition, I have reviewed and discussed with patient certain preventive protocols, quality metrics, and best practice recommendations. A  written personalized care plan for preventive services as well as general preventive health recommendations were provided to patient.     Sheral Flow, LPN  [id number]  Nurse Health Advisor

## 2020-04-24 NOTE — Patient Instructions (Addendum)
[name] [name] , Thank you for taking time to come for your Medicare Wellness Visit. I appreciate your ongoing commitment to your health goals. Please review the following plan we discussed and let me know if I can assist you in the future.   Screening recommendations/referrals: Colonoscopy: last done on 04/12/2012; due 2023 Mammogram: last done on 11/28/2019; due every 1-2 years Bone Density: last done on 02/08/2019; due every 1-3 years Recommended yearly ophthalmology/optometry visit for glaucoma screening and checkup Recommended yearly dental visit for hygiene and checkup  Vaccinations: Influenza vaccine: 11/02/2019 Pneumococcal vaccine: completed Tdap vaccine: last done on 01/14/2012; due every 10 years Shingles vaccine: declined; cdc information provided to patient Covid-19: completed Prolia: 12/30/2019; due in 06/2020 (every 6 months)  Advanced directives: No, patient declined  Conditions/risks identified: Yes  Next appointment: Please schedule your 1 year Medicare Wellness Visit with your Health Coach.   Preventive Care 74 Years and Older, Female Preventive care refers to lifestyle choices and visits with your health care provider that can promote health and wellness. What does preventive care include?  A yearly physical exam. This is also called an annual well check.  Dental exams once or twice a year.  Routine eye exams. Ask your health care provider how often you should have your eyes checked.  Personal lifestyle choices, including:  Daily care of your teeth and gums.  Regular physical activity.  Eating a healthy diet.  Avoiding tobacco and drug use.  Limiting alcohol use.  Practicing safe sex.  Taking low-dose aspirin every day.  Taking vitamin and mineral supplements as recommended by your health care provider. What happens during an annual well check? The services and screenings done by your health care provider during your annual well check will depend on your  age, overall health, lifestyle risk factors, and family history of disease. Counseling  Your health care provider may ask you questions about your:  Alcohol use.  Tobacco use.  Drug use.  Emotional well-being.  Home and relationship well-being.  Sexual activity.  Eating habits.  History of falls.  Memory and ability to understand (cognition).  Work and work Statistician.  Reproductive health. Screening  You may have the following tests or measurements:  Height, weight, and BMI.  Blood pressure.  Lipid and cholesterol levels. These may be checked every 5 years, or more frequently if you are over 7 years old.  Skin check.  Lung cancer screening. You may have this screening every year starting at age 6 if you have a 30-pack-year history of smoking and currently smoke or have quit within the past 15 years.  Fecal occult blood test (FOBT) of the stool. You may have this test every year starting at age 38.  Flexible sigmoidoscopy or colonoscopy. You may have a sigmoidoscopy every 5 years or a colonoscopy every 10 years starting at age 66.  Hepatitis C blood test.  Hepatitis B blood test.  Sexually transmitted disease (STD) testing.  Diabetes screening. This is done by checking your blood sugar (glucose) after you have not eaten for a while (fasting). You may have this done every 1-3 years.  Bone density scan. This is done to screen for osteoporosis. You may have this done starting at age 58.  Mammogram. This may be done every 1-2 years. Talk to your health care provider about how often you should have regular mammograms. Talk with your health care provider about your test results, treatment options, and if necessary, the need for more tests. Vaccines  Your  health care provider may recommend certain vaccines, such as:  Influenza vaccine. This is recommended every year.  Tetanus, diphtheria, and acellular pertussis (Tdap, Td) vaccine. You may need a Td booster  every 10 years.  Zoster vaccine. You may need this after age 40.  Pneumococcal 13-valent conjugate (PCV13) vaccine. One dose is recommended after age 13.  Pneumococcal polysaccharide (PPSV23) vaccine. One dose is recommended after age 36. Talk to your health care provider about which screenings and vaccines you need and how often you need them. This information is not intended to replace advice given to you by your health care provider. Make sure you discuss any questions you have with your health care provider. Document Released: 12/07/2015 Document Revised: 07/30/2016 Document Reviewed: 09/11/2015 Elsevier Interactive Patient Education  2017 [employer] Prevention in the Home Falls can cause injuries. They can happen to people of all ages. There are many things you can do to make your home safe and to help prevent falls. What can I do on the outside of my home?  Regularly fix the edges of walkways and driveways and fix any cracks.  Remove anything that might make you trip as you walk through a door, such as a raised step or threshold.  Trim any bushes or trees on the path to your home.  Use bright outdoor lighting.  Clear any walking paths of anything that might make someone trip, such as rocks or tools.  Regularly check to see if handrails are loose or broken. Make sure that both sides of any steps have handrails.  Any raised decks and porches should have guardrails on the edges.  Have any leaves, snow, or ice cleared regularly.  Use sand or salt on walking paths during winter.  Clean up any spills in your garage right away. This includes oil or grease spills. What can I do in the bathroom?  Use night lights.  Install grab bars by the toilet and in the tub and shower. Do not use towel bars as grab bars.  Use non-skid mats or decals in the tub or shower.  If you need to sit down in the shower, use a plastic, non-slip stool.  Keep the floor dry. Clean up any  water that spills on the floor as soon as it happens.  Remove soap buildup in the tub or shower regularly.  Attach bath mats securely with double-sided non-slip rug tape.  Do not have throw rugs and other things on the floor that can make you trip. What can I do in the bedroom?  Use night lights.  Make sure that you have a light by your bed that is easy to reach.  Do not use any sheets or blankets that are too big for your bed. They should not hang down onto the floor.  Have a firm chair that has side arms. You can use this for support while you get dressed.  Do not have throw rugs and other things on the floor that can make you trip. What can I do in the kitchen?  Clean up any spills right away.  Avoid walking on wet floors.  Keep items that you use a lot in easy-to-reach places.  If you need to reach something above you, use a strong step stool that has a grab bar.  Keep electrical cords out of the way.  Do not use floor polish or wax that makes floors slippery. If you must use wax, use non-skid floor wax.  Do not  have throw rugs and other things on the floor that can make you trip. What can I do with my stairs?  Do not leave any items on the stairs.  Make sure that there are handrails on both sides of the stairs and use them. Fix handrails that are broken or loose. Make sure that handrails are as long as the stairways.  Check any carpeting to make sure that it is firmly attached to the stairs. Fix any carpet that is loose or worn.  Avoid having throw rugs at the top or bottom of the stairs. If you do have throw rugs, attach them to the floor with carpet tape.  Make sure that you have a light switch at the top of the stairs and the bottom of the stairs. If you do not have them, ask someone to add them for you. What else can I do to help prevent falls?  Wear shoes that:  Do not have high heels.  Have rubber bottoms.  Are comfortable and fit you well.  Are closed  at the toe. Do not wear sandals.  If you use a stepladder:  Make sure that it is fully opened. Do not climb a closed stepladder.  Make sure that both sides of the stepladder are locked into place.  Ask someone to hold it for you, if possible.  Clearly mark and make sure that you can see:  Any grab bars or handrails.  First and last steps.  Where the edge of each step is.  Use tools that help you move around (mobility aids) if they are needed. These include:  Canes.  Walkers.  Scooters.  Crutches.  Turn on the lights when you go into a dark area. Replace any light bulbs as soon as they burn out.  Set up your furniture so you have a clear path. Avoid moving your furniture around.  If any of your floors are uneven, fix them.  If there are any pets around you, be aware of where they are.  Review your medicines with your doctor. Some medicines can make you feel dizzy. This can increase your chance of falling. Ask your doctor what other things that you can do to help prevent falls. This information is not intended to replace advice given to you by your health care provider. Make sure you discuss any questions you have with your health care provider. Document Released: 09/06/2009 Document Revised: 04/17/2016 Document Reviewed: 12/15/2014 Elsevier Interactive Patient Education  2017 [employer].

## 2020-04-26 ENCOUNTER — Telehealth: Payer: Self-pay | Admitting: Internal Medicine

## 2020-04-26 NOTE — Progress Notes (Signed)
  Chronic Care Management   Outreach Note  04/26/2020 Name: Uliana Ladue MRN: [id number] DOB: Jul 29, 1946  Referred by: Binnie Rail, MD Reason for referral : No chief complaint on file.   An unsuccessful telephone outreach was attempted today. The patient was referred to the pharmacist for assistance with care management and care coordination.   This note is not being shared with the patient for the following reason: To respect privacy (The patient or proxy has requested that the information not be shared).  Follow Up Plan:   Earney [place] Upstream Scheduler

## 2020-04-30 NOTE — Progress Notes (Signed)
Subjective:    Patient ID: [name] [name], female    DOB: [birth year], 74 y.o.   MRN: [id number]  HPI The patient is here for an acute visit.  4/15 we had a virtual visit for diarrhea mostly in the morning.  She had associated abdominal bloating and cramping, dec appetite.  She denied fevers. Her symptoms resolved with Augmentin.   Last thursday she had a slice of pizza.  A couple of days later she had epigastric pain and lower abd - these pains have continued.  She has gas pains.  She had loose stools  - today a little better.  She denies blood in the stool or black stool.    She experiences gerd most days.     Medications and allergies reviewed with patient and updated if appropriate.  Patient Active Problem List   Diagnosis Date Noted  . Acute post-traumatic headache, not intractable 11/15/2019  . Prediabetes 05/02/2019  . Carbuncle of groin 02/25/2019  . Skin abnormalities 05/07/2018  . Bilateral shoulder pain 12/11/2017  . Gastritis 12/11/2017  . Trigger finger, right little finger 09/11/2017  . Numbness and tingling in left hand 07/20/2017  . Dupuytren's contracture of hand 07/20/2017  . CTS (carpal tunnel syndrome) 07/17/2017  . Bilateral carpal tunnel syndrome 07/17/2017  . Arthralgia 06/30/2017  . Leg cramps 06/10/2017  . 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 Chronic midline low back pain without sciatica 09/15/2016  . Osteopenia 07/20/2016  . Bilateral finger numbness 06/03/2016  . Joint pain 06/03/2016  . Atypical chest pain 02/09/2015  . Hyperlipidemia 02/09/2015  . Bilateral primary osteoarthritis of knee 10/03/2013  . Lateral meniscal tear 10/03/2013  . 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 Varicose vein of leg   . Allergic rhinitis   . IBS (irritable bowel syndrome) 04/07/2011  . Epigastric pain 12/26/2010  . Diarrhea 12/25/2010  . Migraine headache 09/27/2010  . Essential hypertension 09/27/2010  . GERD 09/27/2010  . RENAL  CALCULUS, HX OF 09/27/2010    Current Outpatient Medications on File Prior to Visit  Medication Sig Dispense Refill  . atorvastatin (LIPITOR) 40 MG tablet Take 1/2 (one-half) tablet by mouth once daily 45 tablet 0  . calcium carbonate (OS-CAL) 600 MG tablet Take 1 tablet (600 mg total) by mouth daily. 60 tablet   . Cholecalciferol (VITAMIN D3) 1000 units CAPS Take 1,000 Units by mouth 3 (three) times a week.     . cyanocobalamin 1000 MCG tablet Take 1,000 mcg by mouth daily.    Marland Kitchen dicyclomine (BENTYL) 10 MG capsule Take 1 capsule (10 mg total) by mouth every 8 (eight) hours as needed for spasms. Please schedule an office visit for further refills: [phone] 60 capsule 0  . losartan (COZAAR) 100 MG tablet Take 1 tablet by mouth once daily 90 tablet 0  . metoprolol succinate (TOPROL-XL) 50 MG 24 hr tablet Take 1 tablet by mouth once daily 90 tablet 1  . pantoprazole (PROTONIX) 40 MG tablet Take 1 tablet by mouth once daily 90 tablet 1  . butalbital-acetaminophen-caffeine (FIORICET) 50-325-40 MG tablet TAKE 1 TABLET BY MOUTH EVERY 4 HOURS AS NEEDED FOR HEADACHE (Patient not taking: Reported on 05/01/2020) 30 tablet 0   No current facility-administered medications on file prior to visit.    Past Medical History:  Diagnosis Date  . Arthritis   . Atypical chest pain   . Diverticulosis   . Gastritis 11/08/2007, 04/12/2012   H pylori bx neg 03/2012  . GERD (gastroesophageal  reflux disease) 11/08/2007, 04/12/2012  . Hepatic cyst   . Hiatal hernia 11/08/2007, 04/12/2012  . History of kidney stones   . Hyperlipidemia   . Hypertension   . IBS (irritable bowel syndrome)    diarrhea predom  . Migraine   . Osteoporosis 09/27/2010   DEXA 01/21/2012: -2.1 spine and R fem, -1.8 L fem s/p fosamax  2004-2011 DEXA 04/18/14 @ [facility]: -2.5 - rec to start Prolia    . PVC's (premature ventricular contractions)   . 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 Shingles outbreak 04/2013     Past Surgical History:  Procedure Laterality Date  . APPENDECTOMY  1958  . CYSTOCELE REPAIR  2008  . Maxilofacial  1992  . TONSILLECTOMY  1960    Social History   Socioeconomic History  . Marital status: Married    Spouse name: [name]  . Number of children: 2  . Years of education: Not on file  . Highest education level: Master's degree (e.g., MA, MS, MEng, MEd, MSW, MBA)  Occupational History  . Occupation: retired  Tobacco Use  . Smoking status: Never Smoker  . Smokeless tobacco: Never Used  Substance and Sexual Activity  . Alcohol use: No  . Drug use: No  . Sexual activity: Not on file  Other Topics Concern  . Not on file  Social History Narrative   Married, lives with spouse. Retired Licensed conveyancer, Print production planner. Has 3 kids (moved to [place] to be closer)- youngest son MD. Dorie Rank to  from [place] 06/2010, lived in [place] x 10years   Social Determinants of Health   Financial Resource Strain:   . Difficulty of Paying Living Expenses:   Food Insecurity:   . Worried About Charity fundraiser in the Last Year:   . Arboriculturist in the Last Year:   Transportation Needs:   . Film/video editor (Medical):   Marland Kitchen Lack of Transportation (Non-Medical):   Physical Activity:   . Days of Exercise per Week:   . Minutes of Exercise per Session:   Stress:   . Feeling of Stress :   Social Connections:   . Frequency of Communication with Friends and Family:   . Frequency of Social Gatherings with Friends and Family:   . Attends Religious Services:   . Active Member of Clubs or Organizations:   . Attends Archivist Meetings:   Marland Kitchen Marital Status:     Family History  Problem Relation Age of Onset  . Hypertension Mother   . Alzheimer's disease Mother 69  . AAA (abdominal aortic aneurysm) Mother   . Stroke Father 24  . Kidney cancer Brother        mets  . Bone cancer Brother 13  . Colon cancer Neg Hx     Review of Systems  Constitutional: Negative for  chills and fever.  Gastrointestinal: Positive for abdominal pain. Negative for blood in stool (sometimes black stool), diarrhea (loose stools), nausea and vomiting.       GERD  Neurological: Positive for headaches.       Objective:   Vitals:   05/01/20 0933  BP: 128/72  Pulse: 66  Temp: 98.2 F (36.8 C)  SpO2: 93%   BP Readings from Last 3 Encounters:  05/01/20 128/72  04/24/20 120/60  02/20/20 138/84   Wt Readings from Last 3 Encounters:  05/01/20 135 lb 9.6 oz (61.5 kg)  04/24/20 139 lb (63 kg)  02/20/20 140 lb (63.5 kg)  Body mass index is 24.02 kg/m.   Physical Exam Constitutional:      General: She is not in acute distress.    Appearance: She is well-developed. She is not ill-appearing.  HENT:     Head: Normocephalic and atraumatic.  Abdominal:     General: There is no distension.     Tenderness: There is abdominal tenderness in the right lower quadrant, epigastric area and left lower quadrant. There is no guarding or rebound. Negative signs include Murphy's sign and McBurney's sign.     Hernia: No hernia is present.  Skin:    General: Skin is warm and dry.  Neurological:     Mental Status: She is alert.            Assessment & Plan:    See Problem List for Assessment and Plan of chronic medical problems.    This visit occurred during the SARS-CoV-2 public health emergency.  Safety protocols were in place, including screening questions prior to the visit, additional usage of staff PPE, and extensive cleaning of exam room while observing appropriate contact time as indicated for disinfecting solutions.

## 2020-05-01 ENCOUNTER — Other Ambulatory Visit: Payer: Self-pay

## 2020-05-01 ENCOUNTER — Ambulatory Visit (INDEPENDENT_AMBULATORY_CARE_PROVIDER_SITE_OTHER): Payer: PPO | Admitting: Internal Medicine

## 2020-05-01 ENCOUNTER — Encounter: Payer: Self-pay | Admitting: Internal Medicine

## 2020-05-01 VITALS — BP 128/72 | HR 66 | Temp 98.2°F | Ht 63.0 in | Wt 135.6 lb

## 2020-05-01 DIAGNOSIS — R1032 Left lower quadrant pain: Secondary | ICD-10-CM | POA: Diagnosis not present

## 2020-05-01 DIAGNOSIS — K219 Gastro-esophageal reflux disease without esophagitis: Secondary | ICD-10-CM

## 2020-05-01 HISTORY — DX: Left lower quadrant pain: R10.32

## 2020-05-01 LAB — CBC WITH DIFFERENTIAL/PLATELET
Basophils Absolute: 0.1 10*3/uL (ref 0.0–0.1)
Basophils Relative: 0.8 % (ref 0.0–3.0)
Eosinophils Absolute: 0.3 10*3/uL (ref 0.0–0.7)
Eosinophils Relative: 3.4 % (ref 0.0–5.0)
HCT: 39.6 % (ref 36.0–46.0)
Hemoglobin: 13.4 g/dL (ref 12.0–15.0)
Lymphocytes Relative: 21.9 % (ref 12.0–46.0)
Lymphs Abs: 1.6 10*3/uL (ref 0.7–4.0)
MCHC: 33.9 g/dL (ref 30.0–36.0)
MCV: 100.3 fl — ABNORMAL HIGH (ref 78.0–100.0)
Monocytes Absolute: 0.8 10*3/uL (ref 0.1–1.0)
Monocytes Relative: 10.8 % (ref 3.0–12.0)
Neutro Abs: 4.7 10*3/uL (ref 1.4–7.7)
Neutrophils Relative %: 63.1 % (ref 43.0–77.0)
Platelets: 213 10*3/uL (ref 150.0–400.0)
RBC: 3.95 Mil/uL (ref 3.87–5.11)
RDW: 13.4 % (ref 11.5–15.5)
WBC: 7.5 10*3/uL (ref 4.0–10.5)

## 2020-05-01 LAB — COMPREHENSIVE METABOLIC PANEL
ALT: 15 U/L (ref 0–35)
AST: 19 U/L (ref 0–37)
Albumin: 4 g/dL (ref 3.5–5.2)
Alkaline Phosphatase: 40 U/L (ref 39–117)
BUN: 20 mg/dL (ref 6–23)
CO2: 32 mEq/L (ref 19–32)
Calcium: 9.5 mg/dL (ref 8.4–10.5)
Chloride: 104 mEq/L (ref 96–112)
Creatinine, Ser: 0.83 mg/dL (ref 0.40–1.20)
GFR: 67.03 mL/min (ref >60.00)
Glucose, Bld: 79 mg/dL (ref 70–99)
Potassium: 4.5 mEq/L (ref 3.5–5.1)
Sodium: 139 mEq/L (ref 135–145)
Total Bilirubin: 0.5 mg/dL (ref 0.2–1.2)
Total Protein: 6.5 g/dL (ref 6.0–8.3)

## 2020-05-01 MED ORDER — PANTOPRAZOLE SODIUM 40 MG PO TBEC: 40.0000 mg | DELAYED_RELEASE_TABLET | Freq: Two times a day (BID) | ORAL | 1 refills | Status: DC

## 2020-05-01 NOTE — Assessment & Plan Note (Addendum)
Chronic Not controlled Has symptoms 3/week H/o gastritis Stop fish oil Increase pantoprazole to [facility] If no improvement will need to see GI

## 2020-05-01 NOTE — Assessment & Plan Note (Signed)
Acute LLQ and lower abdominal pain associated with loose stools Concern for colitis or diverticulitis Less likely food sensitivity Cbc, cmp CT abd/pelvis Depending on above will need to see GI

## 2020-05-01 NOTE — Patient Instructions (Addendum)
  Blood work was ordered.     Medications reviewed and updated.  Changes include :     Stop taking the fish oil.  Increase pantoprazole to twice a day.   Your prescription(s) have been submitted to your pharmacy. Please take as directed and contact our office if you believe you are having problem(s) with the medication(s).   A ct scan was ordered.

## 2020-05-02 ENCOUNTER — Ambulatory Visit (INDEPENDENT_AMBULATORY_CARE_PROVIDER_SITE_OTHER)
Admission: RE | Admit: 2020-05-02 | Discharge: 2020-05-02 | Disposition: A | Discharge status: Home / Self Care | Payer: PPO | Source: Ambulatory Visit | Attending: Internal Medicine | Admitting: Internal Medicine

## 2020-05-02 ENCOUNTER — Ambulatory Visit: Payer: PPO | Admitting: Internal Medicine

## 2020-05-02 DIAGNOSIS — R1032 Left lower quadrant pain: Secondary | ICD-10-CM

## 2020-05-02 DIAGNOSIS — R109 Unspecified abdominal pain: Secondary | ICD-10-CM | POA: Diagnosis not present

## 2020-05-02 MED ORDER — IOHEXOL 300 MG/ML  SOLN
80.0000 mL | Freq: Once | INTRAMUSCULAR | Status: AC | PRN
  Administered 2020-05-02: 80 mL via INTRAVENOUS

## 2020-05-07 ENCOUNTER — Telehealth: Payer: Self-pay | Admitting: Internal Medicine

## 2020-05-07 NOTE — Progress Notes (Signed)
  Chronic Care Management   Outreach Note  05/07/2020 Name: Elan Mcelvain MRN: [id number] DOB: January 05, 1946  Referred by: Binnie Rail, MD Reason for referral : No chief complaint on file.   An unsuccessful telephone outreach was attempted today. The patient was referred to the pharmacist for assistance with care management and care coordination. This note is not being shared with the patient for the following reason: To respect privacy (The patient or proxy has requested that the information not be shared).  Follow Up Plan:   Earney Hamburg Upstream Scheduler

## 2020-05-09 ENCOUNTER — Ambulatory Visit: Payer: PPO | Admitting: Family Medicine

## 2020-05-09 ENCOUNTER — Other Ambulatory Visit: Payer: Self-pay

## 2020-05-09 ENCOUNTER — Encounter: Payer: Self-pay | Admitting: Family Medicine

## 2020-05-09 DIAGNOSIS — M170 Bilateral primary osteoarthritis of knee: Secondary | ICD-10-CM

## 2020-05-09 NOTE — Assessment & Plan Note (Signed)
Bilateral injections given today, tolerated the procedure well, discussed icing regimen and home exercise.  Chronic problem with exacerbation.  Can be a candidate for viscosupplementation.  Would like to avoid the surgery intervention if possible.  Follow-up again in 4 to 6 weeks discussed medications which patient declined at the moment

## 2020-05-09 NOTE — Patient Instructions (Signed)
See me in 8-10 weeks for gel injections if needed

## 2020-05-09 NOTE — Progress Notes (Signed)
[place] [facility] [place] Phone: [phone] Subjective:   [name], am serving as a scribe for Dr. Hulan Saas. This visit occurred during the SARS-CoV-2 public health emergency.  Safety protocols were in place, including screening questions prior to the visit, additional usage of staff PPE, and extensive cleaning of exam room while observing appropriate contact time as indicated for disinfecting solutions.   I'm seeing this patient by the request  of:  Binnie Rail, MD  CC: Bilateral knee pain  WER:[id number]   02/20/2020 Patient was having pain again.  At the moment secondary to the amount of steroids and due to patient size wanted to hold on injections.  I do feel laboratory work-up may show another reason why patient is having so much arthralgia.  If not when patient comes back we will consider injections again and could be a candidate for viscosupplementation in the long run.  Update 05/09/2020 [name] [name] is a 74 y.o. female coming in with complaint of bilateral knee pain. Patient states that L>R. Has had increase in pain in last week. Antalgic gait today.  Patient continues to have discomfort affecting daily activities.  Wanting to travel to see grandchildren but is finding it difficult.      Past Medical History:  Diagnosis Date  . Arthritis   . Atypical chest pain   . Diverticulosis   . Gastritis 11/08/2007, 04/12/2012   H pylori bx neg 03/2012  . GERD (gastroesophageal reflux disease) 11/08/2007, 04/12/2012  . Hepatic cyst   . Hiatal hernia 11/08/2007, 04/12/2012  . History of kidney stones   . Hyperlipidemia   . Hypertension   . IBS (irritable bowel syndrome)    diarrhea predom  . Migraine   . Osteoporosis 09/27/2010   DEXA 01/21/2012: -2.1 spine and R fem, -1.8 L fem s/p fosamax  2004-2011 DEXA 04/18/14 @ [facility]: -2.5 - rec to start Prolia    . PVC's (premature ventricular contractions)   . Schatzki's  ring 11/11/2010   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  . Shingles outbreak 04/2013   Past Surgical History:  Procedure Laterality Date  . APPENDECTOMY  1958  . CYSTOCELE REPAIR  2008  . Maxilofacial  1992  . TONSILLECTOMY  1960   Social History   Socioeconomic History  . Marital status: Married    Spouse name: [name] [name]  . Number of children: 2  . Years of education: Not on file  . Highest education level: Master's degree (e.g., MA, MS, MEng, MEd, MSW, MBA)  Occupational History  . Occupation: retired  Tobacco Use  . Smoking status: Never Smoker  . Smokeless tobacco: Never Used  Substance and Sexual Activity  . Alcohol use: No  . Drug use: No  . Sexual activity: Not on file  Other Topics Concern  . Not on file  Social History Narrative   Married, lives with spouse. Retired Licensed conveyancer, Print production planner. Has 3 kids (moved to [place] to be closer)- youngest son MD. Dorie Rank to [place] from [place] 06/2010, lived in [place] x 10years   Social Determinants of Health   Financial Resource Strain:   . Difficulty of Paying Living Expenses:   Food Insecurity:   . Worried About Charity fundraiser in the Last Year:   . Arboriculturist in the Last Year:   Transportation Needs:   . Film/video editor (Medical):   Marland Kitchen Lack of Transportation (Non-Medical):   Physical Activity:   . Days  of Exercise per Week:   . Minutes of Exercise per Session:   Stress:   . Feeling of Stress :   Social Connections:   . Frequency of Communication with Friends and Family:   . Frequency of Social Gatherings with Friends and Family:   . Attends Religious Services:   . Active Member of Clubs or Organizations:   . Attends Archivist Meetings:   Marland Kitchen Marital Status:    No Known Allergies Family History  Problem Relation Age of Onset  . Hypertension Mother   . Alzheimer's disease Mother 15  . AAA (abdominal aortic aneurysm) Mother   . Stroke Father 40  . Kidney cancer Brother        mets  . Bone  cancer Brother 108  . Colon cancer Neg Hx      Current Outpatient Medications (Cardiovascular):  .  atorvastatin (LIPITOR) 40 MG tablet, Take 1/2 (one-half) tablet by mouth once daily .  losartan (COZAAR) 100 MG tablet, Take 1 tablet by mouth once daily .  metoprolol succinate (TOPROL-XL) 50 MG 24 hr tablet, Take 1 tablet by mouth once daily   Current Outpatient Medications (Analgesics):  .  butalbital-acetaminophen-caffeine (FIORICET) 50-325-40 MG tablet, TAKE 1 TABLET BY MOUTH EVERY 4 HOURS AS NEEDED FOR HEADACHE  Current Outpatient Medications (Hematological):  .  cyanocobalamin 1000 MCG tablet, Take 1,000 mcg by mouth daily.  Current Outpatient Medications (Other):  .  calcium carbonate (OS-CAL) 600 MG tablet, Take 1 tablet (600 mg total) by mouth daily. .  Cholecalciferol (VITAMIN D3) 1000 units CAPS, Take 1,000 Units by mouth 3 (three) times a week.  .  dicyclomine (BENTYL) 10 MG capsule, Take 1 capsule (10 mg total) by mouth every 8 (eight) hours as needed for spasms. Please schedule an office visit for further refills: [phone] .  pantoprazole (PROTONIX) 40 MG tablet, Take 1 tablet (40 mg total) by mouth 2 (two) times daily before a meal.   Reviewed prior external information including notes and imaging from  primary care provider As well as notes that were available from care everywhere and other healthcare systems.  Past medical history, social, surgical and family history all reviewed in electronic medical record.  No pertanent information unless stated regarding to the chief complaint.   Review of Systems:  No headache, visual changes, nausea, vomiting, diarrhea, constipation, dizziness, abdominal pain, skin rash, fevers, chills, night sweats, weight loss, swollen lymph nodes, body aches, joint swelling, chest pain, shortness of breath, mood changes. POSITIVE muscle aches  Objective  Pulse 71, height 5\' 3"  (1.6 m), weight 136 lb (61.7 kg), SpO2 96 %.   General: No  apparent distress alert and oriented x3 mood and affect normal, dressed appropriately.  HEENT: Pupils equal, extraocular movements intact  Respiratory: Patient's speak in full sentences and does not appear short of breath  Cardiovascular: No lower extremity edema, non tender, no erythema  Neuro: Cranial nerves II through XII are intact, neurovascularly intact in all extremities with 2+ DTRs and 2+ pulses.  Gait antalgic Knee: Bilateral valgus deformity noted.  Abnormal thigh to calf ratio.  Tender to palpation over medial and PF joint line.  ROM full in flexion and extension and lower leg rotation. instability with valgus force.  painful patellar compression. Patellar glide with moderate crepitus. Patellar and quadriceps tendons unremarkable. Hamstring and quadriceps strength is normal.  After informed written and verbal consent, patient was seated on exam table. Right knee was prepped with alcohol swab and utilizing anterolateral  approach, patient's right knee space was injected with 4:1  marcaine 0.5%: Kenalog 40mg /dL. Patient tolerated the procedure well without immediate complications.  After informed written and verbal consent, patient was seated on exam table. Left knee was prepped with alcohol swab and utilizing anterolateral approach, patient's left knee space was injected with 4:1  marcaine 0.5%: Kenalog 40mg /dL. Patient tolerated the procedure well without immediate complications.   Impression and Recommendations:     The above documentation has been reviewed and is accurate and complete Lyndal Pulley, DO       Note: This dictation was prepared with Dragon dictation along with smaller phrase technology. Any transcriptional errors that result from this process are unintentional.

## 2020-05-10 ENCOUNTER — Telehealth: Payer: Self-pay | Admitting: Internal Medicine

## 2020-05-10 NOTE — Progress Notes (Signed)
  Chronic Care Management   Note  05/10/2020 Name: [name] [name] MRN: [id number] DOB: 1945-12-31  [name] [name] is a 74 y.o. year old female who is a primary care patient of Burns, Claudina Lick, MD. I reached out to [name] by phone today in response to a referral sent by [name] [name]'s PCP, Binnie Rail, MD.   [name] was given information about Chronic Care Management services today including:  1. CCM service includes personalized support from designated clinical staff supervised by her physician, including individualized plan of care and coordination with other care providers 2. 24/7 contact phone numbers for assistance for urgent and routine care needs. 3. Service will only be billed when office clinical staff spend 20 minutes or more in a month to coordinate care. 4. Only one practitioner may furnish and bill the service in a calendar month. 5. The patient may stop CCM services at any time (effective at the end of the month) by phone call to the office staff.   Patient agreed to services and verbal consent obtained.  This note is not being shared with the patient for the following reason: To respect privacy (The patient or proxy has requested that the information not be shared). Follow up plan:   Earney Hamburg Upstream Scheduler

## 2020-05-15 ENCOUNTER — Other Ambulatory Visit: Payer: Self-pay | Admitting: Internal Medicine

## 2020-05-15 DIAGNOSIS — H11043 Peripheral pterygium, stationary, bilateral: Secondary | ICD-10-CM | POA: Diagnosis not present

## 2020-05-15 DIAGNOSIS — H0100A Unspecified blepharitis right eye, upper and lower eyelids: Secondary | ICD-10-CM | POA: Diagnosis not present

## 2020-05-15 DIAGNOSIS — H04123 Dry eye syndrome of bilateral lacrimal glands: Secondary | ICD-10-CM | POA: Diagnosis not present

## 2020-05-15 DIAGNOSIS — H43393 Other vitreous opacities, bilateral: Secondary | ICD-10-CM | POA: Diagnosis not present

## 2020-05-15 DIAGNOSIS — H353131 Nonexudative age-related macular degeneration, bilateral, early dry stage: Secondary | ICD-10-CM | POA: Diagnosis not present

## 2020-05-23 ENCOUNTER — Other Ambulatory Visit: Payer: Self-pay | Admitting: Internal Medicine

## 2020-05-31 ENCOUNTER — Ambulatory Visit: Payer: PPO | Admitting: Physician Assistant

## 2020-06-13 ENCOUNTER — Ambulatory Visit: Payer: PPO | Admitting: Internal Medicine

## 2020-06-20 ENCOUNTER — Ambulatory Visit: Payer: PPO | Admitting: Internal Medicine

## 2020-07-23 ENCOUNTER — Ambulatory Visit: Payer: PPO | Admitting: Family Medicine

## 2020-07-26 ENCOUNTER — Encounter: Payer: Self-pay | Admitting: Gastroenterology

## 2020-07-26 ENCOUNTER — Other Ambulatory Visit: Payer: PPO

## 2020-07-26 ENCOUNTER — Ambulatory Visit: Payer: PPO | Admitting: Gastroenterology

## 2020-07-26 VITALS — BP 120/70 | HR 68 | Ht 61.0 in | Wt 133.6 lb

## 2020-07-26 DIAGNOSIS — R197 Diarrhea, unspecified: Secondary | ICD-10-CM

## 2020-07-26 DIAGNOSIS — K219 Gastro-esophageal reflux disease without esophagitis: Secondary | ICD-10-CM | POA: Diagnosis not present

## 2020-07-26 DIAGNOSIS — R194 Change in bowel habit: Secondary | ICD-10-CM

## 2020-07-26 DIAGNOSIS — K625 Hemorrhage of anus and rectum: Secondary | ICD-10-CM

## 2020-07-26 DIAGNOSIS — R109 Unspecified abdominal pain: Secondary | ICD-10-CM

## 2020-07-26 MED ORDER — DICYCLOMINE HCL 10 MG PO CAPS: 10.0000 mg | ORAL_CAPSULE | Freq: Three times a day (TID) | ORAL | 0 refills | Status: DC | PRN

## 2020-07-26 MED ORDER — PLENVU 140 G PO SOLR: 1.0000 | Freq: Once | ORAL | 0 refills | Status: AC

## 2020-07-26 NOTE — Patient Instructions (Addendum)
If you are age 75 or older, your body mass index should be between 23-30. Your Body mass index is 25.24 kg/m. If this is out of the aforementioned range listed, please consider follow up with your Primary Care Provider.  If you are age 44 or younger, your body mass index should be between 19-25. Your Body mass index is 25.24 kg/m. If this is out of the aformentioned range listed, please consider follow up with your Primary Care Provider.   You have been scheduled for a EGD/Colonoscopy. Please follow written instructions given to you at your visit today.  Please pick up your prep supplies at the pharmacy within the next 1-3 days. If you use inhalers (even only as needed), please bring them with you on the day of your procedure.  Please go to the lab in the basement of our building to have lab work done as you leave today. Hit "B" for basement when you get on the elevator.  When the doors open the lab is on your left.  We will call you with the results. Thank you. PLEASE RETURN YOUR SAMPLE TO THE LAB AS SOON AS POSSIBLE.  Due to recent changes in healthcare laws, you may see the results of your imaging and laboratory studies on MyChart before your provider has had a chance to review them.  We understand that in some cases there may be results that are confusing or concerning to you. Not all laboratory results come back in the same time frame and the provider may be waiting for multiple results in order to interpret others.  Please give [place] 48 hours in order for your provider to thoroughly review all the results before contacting the office for clarification of your results.    Continue Bentyl 10 mg: every 8 hours as needed  You can use Pepto Bismol as needed.  Thank you for entrusting me with your care and for choosing [facility], Dr. [place] Cellar

## 2020-07-26 NOTE — Progress Notes (Signed)
HPI :  74 year old female with a history of GERD, suspected history IBS, last seen in February 2019, here for a follow-up visit for abdominal discomfort and change in bowel habits.  She is accompanied by her husband today.  For the past several weeks she was visiting [place], states she had severe diarrheal illness there.  She has had problems with irregular bowel habits and abdominal discomfort for the past several weeks.  Pain comes and goes, feels it most days, 4 to 5 days a week, can last hours at a time.  States it is in the lower abdomen.  She also has about 3-4 loose stools per day, sometimes more than that, not solid, no blood in the stools.  She does feel some relief with a bowel movement of her pain in that area.  No fevers.  She has had some blood on the toilet paper, she is not sure if it is in the stool as well.  She does not have any antibiotics recently.  She states she typically tolerates the food in [place] well however was having problems there with the symptoms.  She has been using some Bentyl which does help her pain.  She had a CT scan in June for abdominal pain she cannot recall if it is the same pain or something different.  There is no evidence of diverticulitis on that exam.  She otherwise has been having some upper abdominal discomfort as well.  She states when she fasts she develops upper abdominal pain.  She when she eats it makes her feel better.  She states this is actually going on for a long time.  She takes Protonix 40 mg twice daily for longstanding reflux.  She states it has been working fairly well for that purpose, but the upper abdominal discomfort still bothers her.  She does not take any NSAIDs.  Only using Tylenol.  She has had a history of a mild Schatzki ring that is been dilated in the past, she denies any dysphagia at this time.  Looks like she has been on chronic PPI for a while for reflux symptoms.  More recently she was on Dexilant but at some point time was  transitioned back to Protonix.  She denies any family history of colon cancer.  Her last colonoscopy was in 2013 in which she had no polyps removed.   Endoscopic history : EGD 04/12/2012 - 3cm HH, otherwise normal, clo test negative Colonoscopy 04/12/2012 - diverticulosis, no polyps EGD 10/2008 - mild Shatski ring dilated to 54Fr Venia Minks, small HH  CT scan 01/05/2018 - multiple hepatic cysts, mild diffuse hepatic steatosis, normal GB, normal pancreas. "Mild enhancement" of left colon, nonspecific  HIDA 01/01/2017 - normal [place] RUQ 12/26/2016 - no gallstones   CT scan 05/03/20 - IMPRESSION: 1. No diverticulitis. 2. No explanation for abdominal pain. 3. Small hiatal hernia. 4. Benign-appearing cysts of the liver and kidneys.  CBC and LFTs normal       Past Medical History:  Diagnosis Date  . Arthritis   . Atypical chest pain   . Diverticulosis   . Gastritis 11/08/2007, 04/12/2012   H pylori bx neg 03/2012  . GERD (gastroesophageal reflux disease) 11/08/2007, 04/12/2012  . Hepatic cyst   . Hiatal hernia 11/08/2007, 04/12/2012  . History of kidney stones   . Hyperlipidemia   . Hypertension   . IBS (irritable bowel syndrome)    diarrhea predom  . Migraine   . Osteoporosis 09/27/2010   DEXA 01/21/2012: -2.1  spine and R fem, -1.8 L fem s/p fosamax  2004-2011 DEXA 04/18/14 @ [facility]: -2.5 - rec to start Prolia    . PVC's (premature ventricular contractions)   . 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 Shingles outbreak 04/2013     Past Surgical History:  Procedure Laterality Date  . APPENDECTOMY  1958  . CYSTOCELE REPAIR  2008  . Maxilofacial  1992  . TONSILLECTOMY  1960   Family History  Problem Relation Age of Onset  . Hypertension Mother   . Alzheimer's disease Mother 70  . AAA (abdominal aortic aneurysm) Mother   . Stroke Father 65  . Kidney cancer Brother        mets  . Bone cancer Brother 67  . Colon cancer Neg Hx    Social History    Tobacco Use  . Smoking status: Never Smoker  . Smokeless tobacco: Never Used  Substance Use Topics  . Alcohol use: No  . Drug use: No   Current Outpatient Medications  Medication Sig Dispense Refill  . atorvastatin (LIPITOR) 40 MG tablet Take 1/2 (one-half) tablet by mouth once daily 45 tablet 0  . butalbital-acetaminophen-caffeine (FIORICET) 50-325-40 MG tablet TAKE 1 TABLET BY MOUTH EVERY 4 HOURS AS NEEDED FOR HEADACHE 30 tablet 0  . calcium carbonate (OS-CAL) 600 MG tablet Take 1 tablet (600 mg total) by mouth daily. 60 tablet   . Cholecalciferol (VITAMIN D3) 1000 units CAPS Take 1,000 Units by mouth 3 (three) times a week.     . cyanocobalamin 1000 MCG tablet Take 1,000 mcg by mouth daily.    Marland Kitchen dicyclomine (BENTYL) 10 MG capsule Take 1 capsule (10 mg total) by mouth every 8 (eight) hours as needed for spasms. Please schedule an office visit for further refills: [phone] 60 capsule 0  . losartan (COZAAR) 100 MG tablet Take 1 tablet by mouth once daily 90 tablet 0  . metoprolol succinate (TOPROL-XL) 50 MG 24 hr tablet Take 1 tablet by mouth once daily 90 tablet 1  . pantoprazole (PROTONIX) 40 MG tablet Take 1 tablet (40 mg total) by mouth 2 (two) times daily before a meal. 180 tablet 1   No current facility-administered medications for this visit.   No Known Allergies   Review of Systems: All systems reviewed and negative except where noted in HPI.   Lab Results  Component Value Date   WBC 7.5 05/01/2020   HGB 13.4 05/01/2020   HCT 39.6 05/01/2020   MCV 100.3 (H) 05/01/2020   PLT 213.0 05/01/2020    Lab Results  Component Value Date   CREATININE 0.83 05/01/2020   BUN 20 05/01/2020   NA 139 05/01/2020   K 4.5 05/01/2020   CL 104 05/01/2020   CO2 32 05/01/2020    Lab Results  Component Value Date   ALT 15 05/01/2020   AST 19 05/01/2020   ALKPHOS 40 05/01/2020   BILITOT 0.5 05/01/2020     Physical Exam: BP 120/70 (BP Location: Left Arm, Patient Position:  Sitting, Cuff Size: Normal)   Pulse 68   Ht 5\' 1"  (1.549 m) Comment: height measured without shoes  Wt 133 lb 9 oz (60.6 kg)   BMI 25.24 kg/m  Constitutional: Pleasant,well-developed, female in no acute distress. HEENT: Normocephalic and atraumatic. Conjunctivae are normal. No scleral icterus. Neck supple.  Cardiovascular: Normal rate, regular rhythm.  Pulmonary/chest: Effort normal and breath sounds normal. No wheezing, rales or rhonchi. Abdominal: Soft, nondistended, nontender. There are no  masses palpable.  Extremities: no edema Lymphadenopathy: No cervical adenopathy noted. Neurological: Alert and oriented to person place and time. Skin: Skin is warm and dry. No rashes noted. Psychiatric: Normal mood and affect. Behavior is normal.   ASSESSMENT AND PLAN: 74 year old female here for reassessment of the following:  Diarrhea / change in bowel habits / rectal bleeding / abdominal pain - history as outlined above.  She has had some travel recently and had severe diarrheal illness.  She does have some historical symptoms consistent with IBS, however not sure if she is had infectious enteritis making that worse or something else going on as she is having increase in stool frequency with some periodic bleeding.  We will send GI pathogen panel initially to ensure no infection that she picked up while traveling.  If that is negative, given her last colonoscopy was several years ago, recommend colonoscopy to further evaluate this issue as symptoms persist..  Her labs and CT scan back in June seemed okay and that is reassuring.  She will continue Bentyl 10 mg every 8 hours in interim, recommend she try some Pepto-Bismol over-the-counter to see if that will help.  Would hold off on Imodium until we have the GI pathogen panel back, if it is negative she can take some Imodium to help slow down the stool frequency.  She agreed, further recommendations pending the results  GERD / abdominal pain - ongoing  reflux symptoms that has required high-dose PPI to control historically.  She has no history of Barrett's on prior exams, however now having some newer epigastric pain that is relieved with eating.  I offered her an upper endoscopy to be done at the same time as colonoscopy if we proceed with that.  Following discussion of risks and benefits of endoscopy, colonoscopy, and anesthesia she want to proceed.  Further recommendations pending results.  She agreed  [place] Cellar, MD [facility]

## 2020-07-31 LAB — GI PROFILE, STOOL, PCR

## 2020-08-01 ENCOUNTER — Ambulatory Visit: Payer: PPO | Admitting: Family Medicine

## 2020-08-06 ENCOUNTER — Other Ambulatory Visit: Payer: Self-pay

## 2020-08-06 ENCOUNTER — Ambulatory Visit (AMBULATORY_SURGERY_CENTER): Payer: PPO | Admitting: Gastroenterology

## 2020-08-06 ENCOUNTER — Encounter: Payer: Self-pay | Admitting: Gastroenterology

## 2020-08-06 VITALS — BP 128/64 | HR 68 | Temp 96.8°F | Resp 17 | Ht 61.0 in | Wt 133.0 lb

## 2020-08-06 DIAGNOSIS — K317 Polyp of stomach and duodenum: Secondary | ICD-10-CM

## 2020-08-06 DIAGNOSIS — K2970 Gastritis, unspecified, without bleeding: Secondary | ICD-10-CM

## 2020-08-06 DIAGNOSIS — K449 Diaphragmatic hernia without obstruction or gangrene: Secondary | ICD-10-CM

## 2020-08-06 DIAGNOSIS — I10 Essential (primary) hypertension: Secondary | ICD-10-CM | POA: Diagnosis not present

## 2020-08-06 DIAGNOSIS — K625 Hemorrhage of anus and rectum: Secondary | ICD-10-CM

## 2020-08-06 DIAGNOSIS — R1013 Epigastric pain: Secondary | ICD-10-CM

## 2020-08-06 DIAGNOSIS — K319 Disease of stomach and duodenum, unspecified: Secondary | ICD-10-CM

## 2020-08-06 DIAGNOSIS — K219 Gastro-esophageal reflux disease without esophagitis: Secondary | ICD-10-CM

## 2020-08-06 DIAGNOSIS — R194 Change in bowel habit: Secondary | ICD-10-CM

## 2020-08-06 DIAGNOSIS — R197 Diarrhea, unspecified: Secondary | ICD-10-CM

## 2020-08-06 DIAGNOSIS — K626 Ulcer of anus and rectum: Secondary | ICD-10-CM | POA: Diagnosis not present

## 2020-08-06 DIAGNOSIS — R1084 Generalized abdominal pain: Secondary | ICD-10-CM | POA: Diagnosis not present

## 2020-08-06 DIAGNOSIS — R1030 Lower abdominal pain, unspecified: Secondary | ICD-10-CM

## 2020-08-06 DIAGNOSIS — K2950 Unspecified chronic gastritis without bleeding: Secondary | ICD-10-CM | POA: Diagnosis not present

## 2020-08-06 MED ORDER — SODIUM CHLORIDE 0.9 % IV SOLN: 500.0000 mL | Freq: Once | INTRAVENOUS | Status: DC

## 2020-08-06 NOTE — Progress Notes (Signed)
Report to PACU, RN, vss, BBS= Clear.  

## 2020-08-06 NOTE — Op Note (Signed)
[place] [name] Procedure Date: 08/06/2020 7:15 AM MRN: [id number] Endoscopist: Remo Lipps P. Havery Moros , MD Age: 74 Referring MD:  Date of Birth: [birth year] Gender: Female Account #: [id number] Procedure:                Colonoscopy Indications:              Lower abdominal pain, Rectal bleeding, Change in                            bowel habits - history of suspected infectious                            enteritis while traveling, now with some persistent                            loose stools / symptoms Medicines:                Monitored Anesthesia Care Procedure:                Pre-Anesthesia Assessment:                           - Prior to the procedure, a History and Physical                            was performed, and patient medications and                            allergies were reviewed. The patient's tolerance of                            previous anesthesia was also reviewed. The risks                            and benefits of the procedure and the sedation                            options and risks were discussed with the patient.                            All questions were answered, and informed consent                            was obtained. Prior Anticoagulants: The patient has                            taken no previous anticoagulant or antiplatelet                            agents. ASA Grade Assessment: II - A patient with                            mild systemic disease. After reviewing the risks  and benefits, the patient was deemed in                            satisfactory condition to undergo the procedure.                           After obtaining informed consent, the colonoscope                            was passed under direct vision. Throughout the                            procedure, the patient's blood pressure, pulse, and                            oxygen saturations were  monitored continuously. The                            Colonoscope was introduced through the anus and                            advanced to the the terminal ileum, with                            identification of the appendiceal orifice and IC                            valve. The colonoscopy was performed without                            difficulty. The patient tolerated the procedure                            well. The quality of the bowel preparation was                            good. The terminal ileum, ileocecal valve,                            appendiceal orifice, and rectum were photographed. Scope In: 7:48:58 AM Scope Out: 8:08:29 AM Scope Withdrawal Time: 0 hours 13 minutes 53 seconds  Total Procedure Duration: 0 hours 19 minutes 31 seconds  Findings:                 Small prolapsed hemorrhoids vs. partial rectal                            prolapse were found on perianal exam and easily                            reduced.                           The terminal ileum appeared normal.  A localized area of mucosa was found in the distal                            rectum / dentate line consistent with focal                            prolapse change. Biopsies were taken with a cold                            forceps for histology to ensure no adenomatous                            changes.                           Internal hemorrhoids were found during retroflexion.                           The exam was otherwise without abnormality. No                            obvious inflammation                           Biopsies for histology were taken with a cold                            forceps from the right colon, left colon and                            transverse colon for evaluation of microscopic                            colitis. Complications:            No immediate complications. Estimated blood loss:                             Minimal. Estimated Blood Loss:     Estimated blood loss was minimal. Impression:               - Prolapsed hemorrhoids vs. partial rectal prolapse                            changes - small - found on perianal exam and easily                            reduced.                           - The examined portion of the ileum was normal.                           - Abnormal mucosa in the rectum. Suspect focal  prolapse change but biopsied.                           - Internal hemorrhoids.                           - The examination was otherwise normal.                           - Biopsies were taken with a cold forceps from the                            right colon, left colon and transverse colon for                            evaluation of microscopic colitis. Recommendation:           - Patient has a contact number available for                            emergencies. The signs and symptoms of potential                            delayed complications were discussed with the                            patient. Return to normal activities tomorrow.                            Written discharge instructions were provided to the                            patient.                           - Resume previous diet.                           - Continue present medications.                           - Await pathology results. Remo Lipps P. Macie Baum, MD 08/06/2020 8:15:16 AM This report has been signed electronically.

## 2020-08-06 NOTE — Patient Instructions (Signed)
Please read handouts provided. Continue present medications. Await pathology results.     YOU HAD AN ENDOSCOPIC PROCEDURE TODAY AT THE [facility]:   Refer to the procedure report that was given to you for any specific questions about what was found during the examination.  If the procedure report does not answer your questions, please call your gastroenterologist to clarify.  If you requested that your care partner not be given the details of your procedure findings, then the procedure report has been included in a sealed envelope for you to review at your convenience later.  YOU SHOULD EXPECT: Some feelings of bloating in the abdomen. Passage of more gas than usual.  Walking can help get rid of the air that was put into your GI tract during the procedure and reduce the bloating. If you had a lower endoscopy (such as a colonoscopy or flexible sigmoidoscopy) you may notice spotting of blood in your stool or on the toilet paper. If you underwent a bowel prep for your procedure, you may not have a normal bowel movement for a few days.  Please Note:  You might notice some irritation and congestion in your nose or some drainage.  This is from the oxygen used during your procedure.  There is no need for concern and it should clear up in a day or so.  SYMPTOMS TO REPORT IMMEDIATELY:   Following lower endoscopy (colonoscopy or flexible sigmoidoscopy):  Excessive amounts of blood in the stool  Significant tenderness or worsening of abdominal pains  Swelling of the abdomen that is new, acute  Fever of 100F or higher   Following upper endoscopy (EGD)  Vomiting of blood or coffee ground material  New chest pain or pain under the shoulder blades  Painful or persistently difficult swallowing  New shortness of breath  Fever of 100F or higher  Black, tarry-looking stools  For urgent or emergent issues, a gastroenterologist can be reached at any hour by calling [phone]. Do not  use MyChart messaging for urgent concerns.    DIET:  We do recommend a small meal at first, but then you may proceed to your regular diet.  Drink plenty of fluids but you should avoid alcoholic beverages for 24 hours.  ACTIVITY:  You should plan to take it easy for the rest of today and you should NOT DRIVE or use heavy machinery until tomorrow (because of the sedation medicines used during the test).    FOLLOW UP: Our staff will call the number listed on your records 48-72 hours following your procedure to check on you and address any questions or concerns that you may have regarding the information given to you following your procedure. If we do not reach you, we will leave a message.  We will attempt to reach you two times.  During this call, we will ask if you have developed any symptoms of COVID 19. If you develop any symptoms (ie: fever, flu-like symptoms, shortness of breath, cough etc.) before then, please call [phone].  If you test positive for Covid 19 in the 2 weeks post procedure, please call and report this information to us.    If any biopsies were taken you will be contacted by phone or by letter within the next 1-3 weeks.  Please call us at [phone] if you have not heard about the biopsies in 3 weeks.    SIGNATURES/CONFIDENTIALITY: You and/or your care partner have signed paperwork which will be entered into your electronic medical   record.  These signatures attest to the fact that that the information above on your After Visit Summary has been reviewed and is understood.  Full responsibility of the confidentiality of this discharge information lies with you and/or your care-partner. 

## 2020-08-06 NOTE — Op Note (Signed)
[place] [name] Procedure Date: 08/06/2020 7:16 AM MRN: [id number] Endoscopist: Remo Lipps P. Havery Moros , MD Age: 74 Referring MD:  Date of Birth: [birth year] Gender: Female Account #: [id number] Procedure:                Upper GI endoscopy Indications:              Epigastric abdominal pain, Follow-up of                            gastro-esophageal reflux disease, loose stools Medicines:                Monitored Anesthesia Care Procedure:                Pre-Anesthesia Assessment:                           - Prior to the procedure, a History and Physical                            was performed, and patient medications and                            allergies were reviewed. The patient's tolerance of                            previous anesthesia was also reviewed. The risks                            and benefits of the procedure and the sedation                            options and risks were discussed with the patient.                            All questions were answered, and informed consent                            was obtained. Prior Anticoagulants: The patient has                            taken no previous anticoagulant or antiplatelet                            agents. ASA Grade Assessment: II - A patient with                            mild systemic disease. After reviewing the risks                            and benefits, the patient was deemed in                            satisfactory condition to undergo the procedure.  After obtaining informed consent, the endoscope was                            passed under direct vision. Throughout the                            procedure, the patient's blood pressure, pulse, and                            oxygen saturations were monitored continuously. The                            Endoscope was introduced through the mouth, and                            advanced to  the second part of duodenum. The upper                            GI endoscopy was accomplished without difficulty.                            The patient tolerated the procedure well. Scope In: Scope Out: Findings:                 Esophagogastric landmarks were identified: the                            Z-line was found at 34 cm, the gastroesophageal                            junction was found at 34 cm and the upper extent of                            the gastric folds was found at 36 cm from the                            incisors.                           A 2 cm hiatal hernia was present.                           The exam of the esophagus was otherwise normal.                           Multiple small benign appearing sessile polyps were                            found in the gastric fundus and in the gastric                            body. Biopsies were taken with a cold forceps for  histology and a few of them were removed as                            representative sample/                           Diffuse atrophic mucosa was found in the entire                            examined stomach. Biopsies were taken from antrum /                            body with a cold forceps for Helicobacter pylori                            testing and to rule out GIM.                           The exam of the stomach was otherwise normal.                           The duodenal bulb and second portion of the                            duodenum were normal. Biopsies for histology were                            taken with a cold forceps for evaluation of celiac                            disease. Complications:            No immediate complications. Estimated blood loss:                            Minimal. Estimated Blood Loss:     Estimated blood loss was minimal. Impression:               - Esophagogastric landmarks identified.                           - 2 cm  hiatal hernia.                           - Normal esophagus otherwise                           - Multiple benign appearing gastric polyps.                            Suspected fundic gland polyps. Biopsied.                           - Gastric mucosal atrophy. Biopsied.                           -  Normal duodenal bulb and second portion of the                            duodenum. Biopsied. Recommendation:           - Patient has a contact number available for                            emergencies. The signs and symptoms of potential                            delayed complications were discussed with the                            patient. Return to normal activities tomorrow.                            Written discharge instructions were provided to the                            patient.                           - Resume previous diet.                           - Continue present medications.                           - Await pathology results. Remo Lipps P. Loella Hickle, MD 08/06/2020 8:19:23 AM This report has been signed electronically.

## 2020-08-06 NOTE — Progress Notes (Signed)
Pt's states no medical or surgical changes since previsit or office visit.  Vitals- Sheila 

## 2020-08-06 NOTE — Progress Notes (Signed)
Called to room to assist during endoscopic procedure.  Patient ID and intended procedure confirmed with present staff. Received instructions for my participation in the procedure from the performing physician.  

## 2020-08-07 ENCOUNTER — Telehealth: Payer: PPO

## 2020-08-07 ENCOUNTER — Other Ambulatory Visit: Payer: Self-pay

## 2020-08-07 DIAGNOSIS — I10 Essential (primary) hypertension: Secondary | ICD-10-CM

## 2020-08-08 ENCOUNTER — Telehealth: Payer: Self-pay

## 2020-08-08 ENCOUNTER — Telehealth: Payer: Self-pay | Admitting: *Deleted

## 2020-08-08 NOTE — Telephone Encounter (Signed)
  Follow up Call-  Call back number 08/06/2020  Post procedure Call Back phone  # [phone]  Permission to leave phone message Yes  Some recent data might be hidden     Patient questions:  Do you have a fever, pain , or abdominal swelling? No. Pain Score  0 *  Have you tolerated food without any problems? Yes.    Have you been able to return to your normal activities? Yes.    Do you have any questions about your discharge instructions: Diet   No. Medications  No. Follow up visit  No.  Do you have questions or concerns about your Care? No.  Actions: * If pain score is 4 or above: No action needed, pain <4.  1. Have you developed a fever since your procedure? NO  2.   Have you had an respiratory symptoms (SOB or cough) since your procedure? NO  3.   Have you tested positive for COVID 19 since your procedure NO  4.   Have you had any family members/close contacts diagnosed with the COVID 19 since your procedure?  NO   If yes to any of these questions please route to Joylene John, RN and Joella Prince, RN

## 2020-08-08 NOTE — Telephone Encounter (Signed)
1st follow up call made.  NALM 

## 2020-08-09 ENCOUNTER — Ambulatory Visit: Payer: PPO | Admitting: Pharmacist

## 2020-08-09 ENCOUNTER — Other Ambulatory Visit: Payer: Self-pay

## 2020-08-09 DIAGNOSIS — E782 Mixed hyperlipidemia: Secondary | ICD-10-CM

## 2020-08-09 DIAGNOSIS — I10 Essential (primary) hypertension: Secondary | ICD-10-CM

## 2020-08-09 NOTE — Chronic Care Management (AMB) (Signed)
Chronic Care Management Pharmacy  Name: [name] [name]  MRN: [id number] DOB: 19-Feb-1946   Chief Complaint/ HPI  [name] [name],  74 y.o. , female presents for their Initial CCM visit with the clinical pharmacist via telephone due to COVID-19 Pandemic.  PCP : Binnie Rail, MD Patient Care Team: Binnie Rail, MD as PCP - General (Internal Medicine) Biagio Borg, MD as PCP - Family Medicine (Internal Medicine) Sable Feil, MD as Consulting Physician (Gastroenterology) Lyndal Pulley, DO (Sports Medicine) Lorretta Harp, MD (Cardiology) Pieter Partridge, DO as Consulting Physician (Neurology) Charlton Haws, [facility] as Pharmacist (Pharmacist)  Their chronic conditions include: Hypertension, Hyperlipidemia, GERD, Osteopenia, Osteoarthritis, Allergic Rhinitis and Migraine, IBS  Office Visits: 05/01/20 Dr Quay Burow OV: acute visit for diarrhea, abdominal pain. Advised to stop fish oil, increase pantoprazole to BID. CT abdomen negative for acute process.  Consult Visit: 08/06/20 endoscopy, colonoscopy  07/26/20 Dr Havery Moros (GI): f/u for abdominal pain and diarrhea. Ordered GI panel and colonoscopy, endoscopy for GERD. GI panel negative for infection. Endoscopy showed hiatal hernia. Colonoscopy showed hemorrhoids.  05/09/20 Dr Tamala Julian (sports med): injection given for bilateral knee osteoarthritis.   No Known Allergies  Medications: Outpatient Encounter Medications as of 08/09/2020  Medication Sig  . atorvastatin (LIPITOR) 40 MG tablet Take 1/2 (one-half) tablet by mouth once daily  . butalbital-acetaminophen-caffeine (FIORICET) 50-325-40 MG tablet TAKE 1 TABLET BY MOUTH EVERY 4 HOURS AS NEEDED FOR HEADACHE  . calcium carbonate (OS-CAL) 600 MG tablet Take 1 tablet (600 mg total) by mouth daily.  . Cholecalciferol (VITAMIN D3) 1000 units CAPS Take 1,000 Units by mouth 3 (three) times a week.   . cyanocobalamin 1000 MCG tablet Take 1,000 mcg by mouth daily.  Marland Kitchen dicyclomine  (BENTYL) 10 MG capsule Take 1 capsule (10 mg total) by mouth every 8 (eight) hours as needed for spasms.  Marland Kitchen losartan (COZAAR) 100 MG tablet Take 1 tablet by mouth once daily  . metoprolol succinate (TOPROL-XL) 50 MG 24 hr tablet Take 1 tablet by mouth once daily  . pantoprazole (PROTONIX) 40 MG tablet Take 1 tablet (40 mg total) by mouth 2 (two) times daily before a meal.   No facility-administered encounter medications on file as of 08/09/2020.    Wt Readings from Last 3 Encounters:  08/06/20 133 lb (60.3 kg)  07/26/20 133 lb 9 oz (60.6 kg)  05/09/20 136 lb (61.7 kg)    Current Diagnosis/Assessment:  SDOH Interventions     Most Recent Value  SDOH Interventions  Financial Strain Interventions Intervention Not Indicated      Goals Addressed            This Visit's Progress   . Pharmacy Care Plan       CARE PLAN ENTRY (see longitudinal plan of care for additional care plan information)  Current Barriers:  . Chronic Disease Management support, education, and care coordination needs related to Hypertension and Hyperlipidemia   Hypertension BP Readings from Last 3 Encounters:  08/06/20 128/64  07/26/20 120/70  05/01/20 128/72 .  Pharmacist Clinical Goal(s): o Over the next 180 days, patient will work with PharmD and providers to maintain BP goal <130/80 . Current regimen:  o Losartan 100 mg daily o Metoprolol succinate 50 mg daily . Interventions: o Discussed BP goals and benefits of medications for prevention of heart attack / stroke . Patient self care activities - Over the next 180 days, patient will: o Check BP as needed, document, and provide at  future appointments o Ensure daily salt intake < 2300 mg/day  Hyperlipidemia Lab Results  Component Value Date/Time   LDLCALC 82 05/02/2019 10:30 AM   LDLDIRECT 75.0 11/02/2019 10:06 AM .  Pharmacist Clinical Goal(s): o Over the next 180 days, patient will work with PharmD and providers to maintain LDL goal <  100 . Current regimen:  o Atorvastatin 40 mg - 1/2 tab daily . Interventions: o Discussed cholesterol goals and benefits of medications for prevention of heart attack / stroke o Discussed importance of taking atorvastatin daily to receive the full benefit for risk reduction . Patient self care activities - Over the next 180 days, patient will: o Take atorvastatin daily  Medication management . Pharmacist Clinical Goal(s): o Over the next 180 days, patient will work with PharmD and providers to maintain optimal medication adherence . Current pharmacy: [facility] . Interventions o Comprehensive medication review performed. o Continue current medication management strategy . Patient self care activities - Over the next 180 days, patient will: o Focus on medication adherence by pill box o Take medications as prescribed o Report any questions or concerns to PharmD and/or provider(s)  Initial goal documentation       Hypertension   BP goal is:  <130/80  Office blood pressures are  BP Readings from Last 3 Encounters:  08/06/20 128/64  07/26/20 120/70  05/01/20 128/72   Pulse Readings from Last 3 Encounters:  08/06/20 68  07/26/20 68  05/09/20 71   Kidney Function Lab Results  Component Value Date/Time   CREATININE 0.83 05/01/2020 10:43 AM   CREATININE 0.84 02/20/2020 10:44 AM   GFR 67.03 05/01/2020 10:43 AM   GFRNONAA 86.29 11/01/2010 09:48 AM   K 4.5 05/01/2020 10:43 AM   K 3.9 02/20/2020 10:44 AM   Patient checks BP at home infrequently Patient home BP readings are ranging: normal  Patient has failed these meds in the past: clonidine Patient is currently controlled on the following medications:  . Losartan 100 mg daily . Metoprolol succinate 50 mg - 1/2 tab BID  We discussed BP goals; benefits of medications; pt denies side effects; a beta blocker is not an ideal choice for hypertension without other compelling indications, however she is very well controlled, no  changes indicated.  Plan  Continue current medications   Hyperlipidemia   LDL goal < 100  Lipid Panel     Component Value Date/Time   CHOL 144 11/02/2019 1006   TRIG 299.0 (H) 11/02/2019 1006   TRIG 177 06/14/2010 0000   HDL 43.80 11/02/2019 1006   LDLCALC 82 05/02/2019 1030   LDLDIRECT 75.0 11/02/2019 1006    Hepatic Function Latest Ref Rng & Units 05/01/2020 02/20/2020 11/02/2019  Total Protein 6.0 - 8.3 g/dL 6.5 6.7 6.7  Albumin 3.5 - 5.2 g/dL 4.0 4.1 4.1  AST 0 - 37 U/L 19 19 17   ALT 0 - 35 U/L 15 14 13   Alk Phosphatase 39 - 117 U/L 40 37(L) 45  Total Bilirubin 0.2 - 1.2 mg/dL 0.5 0.5 0.5  Bilirubin, Direct 0.0 - 0.3 mg/dL - - -    The 10-year ASCVD risk score Mikey Bussing DC Jr., et al., 2013) is: 20.4%   Values used to calculate the score:     Age: 71 years     Sex: Female     Is Non-Hispanic African American: No     Diabetic: No     Tobacco smoker: No     Systolic Blood Pressure: 549 mmHg  Is BP treated: Yes     HDL Cholesterol: 43.8 mg/dL     Total Cholesterol: 144 mg/dL   Patient has failed these meds in past: n/a Patient is currently controlled on the following medications:  . Atorvastatin 40 mg - 1/2 tab 3-4 days per week  We discussed:  Cholesterol goals; benefits of statin for ASCVS risk reduction; pt takes statin a few days a week, does not articulate why, denies side effects; discussed full benefit of statin is achieved with daily dosing  Plan  Continue current medications  Recommend atorvastatin daily dosing  GERD   Patient has failed these meds in past: n/a Patient is currently controlled on the following medications:  . Pantoprazole 40 mg BID  We discussed: Pt denies issues currently; she had endoscopy and colonoscopy this week and is awaiting results.  Plan  Continue current medications  IBS   Patient has failed these meds in past: n/a Patient is currently controlled on the following medications:  . Dicyclomine 10 mg q8h PRN  We  discussed: Pt takes infrequently, denies issues currently  Plan  Continue current medications  Migraines   Patient has failed these meds in past: n/a Patient is currently controlled on the following medications:  . Fioricet 50-325-40 mg PRN  We discussed:  Pt takes infrequently, has not needed in a few months  Plan  Continue current medications  Osteopenia   Last DEXA Scan: 02/08/2019  T-Score femoral neck: -2.2  T-Score total hip: n/a  T-Score lumbar spine: -2.2  T-Score forearm radius: n/a  10-year probability of major osteoporotic fracture: 12.5%  10-year probability of hip fracture: 3%  VITD  Date Value Ref Range Status  02/20/2020 76.73 30.00 - 100.00 ng/mL Final    Patient is a candidate for pharmacologic treatment due to T-Score -1.0 to -2.5 and 10-year risk of hip fracture > 3%  Patient has failed these meds in past: alendronate Patient is currently controlled on the following medications:  . Calcium carbonate 600 mg daily . Vitamin D 1000 IU TIW . Prolia 60 mg q6 months (last given 12/30/2019)  We discussed:  Recommend [phone] units of vitamin D daily. Recommend 1200 mg of calcium daily from dietary and supplemental sources.; pt is overdue for Prolia injection, she has f/u appt with PCP later this month and will schedule injection then  Plan  Continue current medications  Health Maintenance   Lab Results  Component Value Date/Time   GNFAOZHY86 1,952 (H) 06/30/2017 08:43 AM   Patient is currently controlled on the following medications:  Marland Kitchen Vitamin B12 1000 mcg daily  We discussed:  Patient is satisfied with current OTC regimen and denies issues  Plan  Continue current medications  Medication Management   Pt uses [facility] [place] for all medications Uses pill box? Yes Pt endorses 100% compliance  We discussed: Discussed benefits of medication synchronization, packaging and delivery as well as enhanced pharmacist oversight with [facility]; pt wants  to think about switching pharmacies and will let me know   Plan  Continue current medication management strategy    Follow up: 6 month phone visit  Charlene Brooke, PharmD, BCACP Clinical Pharmacist [facility] [phone]

## 2020-08-09 NOTE — Patient Instructions (Addendum)
Visit Information  Phone number for Pharmacist: [phone]  Thank you for meeting with me to discuss your medications! I look forward to working with you to achieve your health care goals. Below is a summary of what we talked about during the visit:  Goals Addressed            This Visit's Progress   . Pharmacy Care Plan       CARE PLAN ENTRY (see longitudinal plan of care for additional care plan information)  Current Barriers:  . Chronic Disease Management support, education, and care coordination needs related to Hypertension and Hyperlipidemia   Hypertension BP Readings from Last 3 Encounters:  08/06/20 128/64  07/26/20 120/70  05/01/20 128/72 .  Pharmacist Clinical Goal(s): o Over the next 180 days, patient will work with PharmD and providers to maintain BP goal <130/80 . Current regimen:  o Losartan 100 mg daily o Metoprolol succinate 50 mg daily . Interventions: o Discussed BP goals and benefits of medications for prevention of heart attack / stroke . Patient self care activities - Over the next 180 days, patient will: o Check BP as needed, document, and provide at future appointments o Ensure daily salt intake < 2300 mg/day  Hyperlipidemia Lab Results  Component Value Date/Time   LDLCALC 82 05/02/2019 10:30 AM   LDLDIRECT 75.0 11/02/2019 10:06 AM .  Pharmacist Clinical Goal(s): o Over the next 180 days, patient will work with PharmD and providers to maintain LDL goal < 100 . Current regimen:  o Atorvastatin 40 mg - 1/2 tab daily . Interventions: o Discussed cholesterol goals and benefits of medications for prevention of heart attack / stroke o Discussed importance of taking atorvastatin daily to receive the full benefit for risk reduction . Patient self care activities - Over the next 180 days, patient will: o Take atorvastatin daily  Medication management . Pharmacist Clinical Goal(s): o Over the next 180 days, patient will work with PharmD and providers  to maintain optimal medication adherence . Current pharmacy: [facility] . Interventions o Comprehensive medication review performed. o Continue current medication management strategy . Patient self care activities - Over the next 180 days, patient will: o Focus on medication adherence by pill box o Take medications as prescribed o Report any questions or concerns to PharmD and/or provider(s)  Initial goal documentation      Ms. Reitman was given information about Chronic Care Management services today including:  1. CCM service includes personalized support from designated clinical staff supervised by her physician, including individualized plan of care and coordination with other care providers 2. 24/7 contact phone numbers for assistance for urgent and routine care needs. 3. Standard insurance, coinsurance, copays and deductibles apply for chronic care management only during months in which we provide at least 20 minutes of these services. Most insurances cover these services at 100%, however patients may be responsible for any copay, coinsurance and/or deductible if applicable. This service may help you avoid the need for more expensive face-to-face services. 4. Only one practitioner may furnish and bill the service in a calendar month. 5. The patient may stop CCM services at any time (effective at the end of the month) by phone call to the office staff.  Patient agreed to services and verbal consent obtained.   Patient verbalizes understanding of instructions provided today.  Telephone follow up appointment with pharmacy team member scheduled for: 6 months  Charlene Brooke, PharmD, [facility] Clinical Pharmacist [facility] [place]  Maintenance After Age 87 After age 4, you are at a higher risk for certain long-term diseases and infections as well as injuries from falls. Falls are a major cause of broken bones and head injuries in people who are  older than age 63. Getting regular preventive care can help to keep you healthy and well. Preventive care includes getting regular testing and making lifestyle changes as recommended by your health care provider. Talk with your health care provider about:  Which screenings and tests you should have. A screening is a test that checks for a disease when you have no symptoms.  A diet and exercise plan that is right for you. What should I know about screenings and tests to prevent falls? Screening and testing are the best ways to find a health problem early. Early diagnosis and treatment give you the best chance of managing medical conditions that are common after age 35. Certain conditions and lifestyle choices may make you more likely to have a fall. Your health care provider may recommend:  Regular vision checks. Poor vision and conditions such as cataracts can make you more likely to have a fall. If you wear glasses, make sure to get your prescription updated if your vision changes.  Medicine review. Work with your health care provider to regularly review all of the medicines you are taking, including over-the-counter medicines. Ask your health care provider about any side effects that may make you more likely to have a fall. Tell your health care provider if any medicines that you take make you feel dizzy or sleepy.  Osteoporosis screening. Osteoporosis is a condition that causes the bones to get weaker. This can make the bones weak and cause them to break more easily.  Blood pressure screening. Blood pressure changes and medicines to control blood pressure can make you feel dizzy.  Strength and balance checks. Your health care provider may recommend certain tests to check your strength and balance while standing, walking, or changing positions.  Foot health exam. Foot pain and numbness, as well as not wearing proper footwear, can make you more likely to have a fall.  Depression screening. You  may be more likely to have a fall if you have a fear of falling, feel emotionally low, or feel unable to do activities that you used to do.  Alcohol use screening. Using too much alcohol can affect your balance and may make you more likely to have a fall. What actions can I take to lower my risk of falls? General instructions  Talk with your health care provider about your risks for falling. Tell your health care provider if: ? You fall. Be sure to tell your health care provider about all falls, even ones that seem minor. ? You feel dizzy, sleepy, or off-balance.  Take over-the-counter and prescription medicines only as told by your health care provider. These include any supplements.  Eat a healthy diet and maintain a healthy weight. A healthy diet includes low-fat dairy products, low-fat (lean) meats, and fiber from whole grains, beans, and lots of fruits and vegetables. Home safety  Remove any tripping hazards, such as rugs, cords, and clutter.  Install safety equipment such as grab bars in bathrooms and safety rails on stairs.  Keep rooms and walkways well-lit. Activity   Follow a regular exercise program to stay fit. This will help you maintain your balance. Ask your health care provider what types of exercise are appropriate for you.  If you need a cane or  walker, use it as recommended by your health care provider.  Wear supportive shoes that have nonskid soles. Lifestyle  Do not drink alcohol if your health care provider tells you not to drink.  If you drink alcohol, limit how much you have: ? 0-1 drink a day for women. ? 0-2 drinks a day for men.  Be aware of how much alcohol is in your drink. In the [place] one drink equals one typical bottle of beer (12 oz), one-half glass of wine (5 oz), or one shot of hard liquor (1 oz).  Do not use any products that contain nicotine or tobacco, such as cigarettes and e-cigarettes. If you need help quitting, ask your health care  provider. Summary  Having a healthy lifestyle and getting preventive care can help to protect your health and wellness after age 41.  Screening and testing are the best way to find a health problem early and help you avoid having a fall. Early diagnosis and treatment give you the best chance for managing medical conditions that are more common for people who are older than age 39.  Falls are a major cause of broken bones and head injuries in people who are older than age 36. Take precautions to prevent a fall at home.  Work with your health care provider to learn what changes you can make to improve your health and wellness and to prevent falls. This information is not intended to replace advice given to you by your health care provider. Make sure you discuss any questions you have with your health care provider. Document Revised: 03/03/2019 Document Reviewed: 09/23/2017 Elsevier Patient Education  2020 [employer].

## 2020-08-14 ENCOUNTER — Telehealth: Payer: Self-pay | Admitting: Gastroenterology

## 2020-08-14 NOTE — Telephone Encounter (Signed)
Spoke with patient regarding pathology results from 08/06/20, read result note from Dr. Havery Moros. Advised patient that if she wanted to review the results again they were available on her My Chart, pt verbalized understanding and stated that she would check.

## 2020-08-22 NOTE — Progress Notes (Signed)
Subjective:    Patient ID: [name] [name], female    DOB: 1946/05/18, 74 y.o.   MRN: [id number]  HPI The patient is here for follow up of their chronic medical problems, including htn, hyperlipidemia, GERD, migraines, prediabetes  Still has epigastric pain before eating or after eating.  She denies GERD.  She has taken the bentyl.  She had her egd and colonoscopy and both were normal.    She is not exercising regularly.      Medications and allergies reviewed with patient and updated if appropriate.  Patient Active Problem List   Diagnosis Date Noted  . Left lower quadrant abdominal pain 05/01/2020  . Acute post-traumatic headache, not intractable 11/15/2019  . Prediabetes 05/02/2019  . Carbuncle of groin 02/25/2019  . Skin abnormalities 05/07/2018  . Bilateral shoulder pain 12/11/2017  . Gastritis 12/11/2017  . Trigger finger, right little finger 09/11/2017  . Numbness and tingling in left hand 07/20/2017  . Dupuytren's contracture of hand 07/20/2017  . CTS (carpal tunnel syndrome) 07/17/2017  . Bilateral carpal tunnel syndrome 07/17/2017  . Arthralgia 06/30/2017  . Leg cramps 06/10/2017  . 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 Chronic midline low back pain without sciatica 09/15/2016  . Osteopenia 07/20/2016  . Bilateral finger numbness 06/03/2016  . Joint pain 06/03/2016  . Atypical chest pain 02/09/2015  . Hyperlipidemia 02/09/2015  . Bilateral primary osteoarthritis of knee 10/03/2013  . Lateral meniscal tear 10/03/2013  . 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 Varicose vein of leg   . Allergic rhinitis   . IBS (irritable bowel syndrome) 04/07/2011  . Epigastric pain 12/26/2010  . Diarrhea 12/25/2010  . Migraine headache 09/27/2010  . Essential hypertension 09/27/2010  . GERD 09/27/2010  . RENAL CALCULUS, HX OF 09/27/2010    Current Outpatient Medications on File Prior to Visit  Medication Sig Dispense Refill  . atorvastatin (LIPITOR)  40 MG tablet Take 1/2 (one-half) tablet by mouth once daily 45 tablet 0  . butalbital-acetaminophen-caffeine (FIORICET) 50-325-40 MG tablet TAKE 1 TABLET BY MOUTH EVERY 4 HOURS AS NEEDED FOR HEADACHE 30 tablet 0  . calcium carbonate (OS-CAL) 600 MG tablet Take 1 tablet (600 mg total) by mouth daily. 60 tablet   . Cholecalciferol (VITAMIN D3) 1000 units CAPS Take 1,000 Units by mouth 3 (three) times a week.     . cyanocobalamin 1000 MCG tablet Take 1,000 mcg by mouth daily.    Marland Kitchen dicyclomine (BENTYL) 10 MG capsule Take 1 capsule (10 mg total) by mouth every 8 (eight) hours as needed for spasms. 60 capsule 0  . losartan (COZAAR) 100 MG tablet Take 1 tablet by mouth once daily 90 tablet 0  . metoprolol succinate (TOPROL-XL) 50 MG 24 hr tablet Take 1 tablet by mouth once daily 90 tablet 1  . pantoprazole (PROTONIX) 40 MG tablet Take 1 tablet (40 mg total) by mouth 2 (two) times daily before a meal. 180 tablet 1   No current facility-administered medications on file prior to visit.    Past Medical History:  Diagnosis Date  . Arthritis   . Atypical chest pain   . Diverticulosis   . Gastritis 11/08/2007, 04/12/2012   H pylori bx neg 03/2012  . GERD (gastroesophageal reflux disease) 11/08/2007, 04/12/2012  . Hepatic cyst   . Hiatal hernia 11/08/2007, 04/12/2012  . History of kidney stones   . Hyperlipidemia   . Hypertension   . IBS (irritable bowel syndrome)    diarrhea predom  .  Migraine   . Osteoporosis 09/27/2010   DEXA 01/21/2012: -2.1 spine and R fem, -1.8 L fem s/p fosamax  2004-2011 DEXA 04/18/14 @ [facility]: -2.5 - rec to start Prolia    . PVC's (premature ventricular contractions)   . 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 Shingles outbreak 04/2013    Past Surgical History:  Procedure Laterality Date  . APPENDECTOMY  1958  . CYSTOCELE REPAIR  2008  . Maxilofacial  1992  . TONSILLECTOMY  1960    Social History   Socioeconomic History  . Marital status:  Married    Spouse name: [name]  . Number of children: 2  . Years of education: Not on file  . Highest education level: Master's degree (e.g., MA, MS, MEng, MEd, MSW, MBA)  Occupational History  . Occupation: retired  Tobacco Use  . Smoking status: Never Smoker  . Smokeless tobacco: Never Used  Substance and Sexual Activity  . Alcohol use: No  . Drug use: No  . Sexual activity: Not on file  Other Topics Concern  . Not on file  Social History Narrative   Married, lives with spouse. Retired Licensed conveyancer, Print production planner. Has 3 kids (moved to [place] to be closer)- youngest son MD. Dorie Rank to [place] from [place] 06/2010, lived in [place] x 10years   Social Determinants of Health   Financial Resource Strain: [place]   . Difficulty of Paying Living Expenses: Not hard at all  Food Insecurity:   . Worried About Charity fundraiser in the Last Year: Not on file  . Ran Out of Food in the Last Year: Not on file  Transportation Needs:   . Lack of Transportation (Medical): Not on file  . Lack of Transportation (Non-Medical): Not on file  Physical Activity:   . Days of Exercise per Week: Not on file  . Minutes of Exercise per Session: Not on file  Stress:   . Feeling of Stress : Not on file  Social Connections:   . Frequency of Communication with Friends and Family: Not on file  . Frequency of Social Gatherings with Friends and Family: Not on file  . Attends Religious Services: Not on file  . Active Member of Clubs or Organizations: Not on file  . Attends Archivist Meetings: Not on file  . Marital Status: Not on file    Family History  Problem Relation Age of Onset  . Hypertension Mother   . Alzheimer's disease Mother 52  . AAA (abdominal aortic aneurysm) Mother   . Stroke Father 49  . Kidney cancer Brother        mets  . Bone cancer Brother 53  . Colon cancer Neg Hx     Review of Systems  Constitutional: Negative for chills and fever.  Respiratory: Negative for cough,  shortness of breath and wheezing.   Cardiovascular: Positive for leg swelling. Negative for chest pain and palpitations.  Neurological: Positive for headaches (occ). Negative for dizziness and light-headedness.       Objective:   Vitals:   08/23/20 1107  BP: 120/72  Pulse: (!) 54  Temp: 98 F (36.7 C)  SpO2: 97%   BP Readings from Last 3 Encounters:  08/23/20 120/72  08/06/20 128/64  07/26/20 120/70   Wt Readings from Last 3 Encounters:  08/23/20 136 lb 3.2 oz (61.8 kg)  08/06/20 133 lb (60.3 kg)  07/26/20 133 lb 9 oz (60.6 kg)   Body mass index is 25.73  kg/m.   Physical Exam    Constitutional: Appears well-developed and well-nourished. No distress.  HENT:  Head: Normocephalic and atraumatic.  Neck: Neck supple. No tracheal deviation present. No thyromegaly present.  No cervical lymphadenopathy Cardiovascular: Normal rate, regular rhythm and normal heart sounds.   No murmur heard. No carotid bruit .  No edema Pulmonary/Chest: Effort normal and breath sounds normal. No respiratory distress. No has no wheezes. No rales.  Skin: Skin is warm and dry. Not diaphoretic.  Psychiatric: Normal mood and affect. Behavior is normal.      Assessment & Plan:    See Problem List for Assessment and Plan of chronic medical problems.    This visit occurred during the SARS-CoV-2 public health emergency.  Safety protocols were in place, including screening questions prior to the visit, additional usage of staff PPE, and extensive cleaning of exam room while observing appropriate contact time as indicated for disinfecting solutions.

## 2020-08-22 NOTE — Patient Instructions (Addendum)
You had a prolia injection today.    Medications reviewed and updated.  Changes include :   none    Please followup in 6 months

## 2020-08-23 ENCOUNTER — Encounter: Payer: Self-pay | Admitting: Internal Medicine

## 2020-08-23 ENCOUNTER — Ambulatory Visit (INDEPENDENT_AMBULATORY_CARE_PROVIDER_SITE_OTHER): Payer: PPO | Admitting: Internal Medicine

## 2020-08-23 ENCOUNTER — Other Ambulatory Visit: Payer: Self-pay

## 2020-08-23 VITALS — BP 120/72 | HR 54 | Temp 98.0°F | Wt 136.2 lb

## 2020-08-23 DIAGNOSIS — K219 Gastro-esophageal reflux disease without esophagitis: Secondary | ICD-10-CM

## 2020-08-23 DIAGNOSIS — R7303 Prediabetes: Secondary | ICD-10-CM

## 2020-08-23 DIAGNOSIS — E782 Mixed hyperlipidemia: Secondary | ICD-10-CM | POA: Diagnosis not present

## 2020-08-23 DIAGNOSIS — G43809 Other migraine, not intractable, without status migrainosus: Secondary | ICD-10-CM

## 2020-08-23 DIAGNOSIS — M810 Age-related osteoporosis without current pathological fracture: Secondary | ICD-10-CM

## 2020-08-23 DIAGNOSIS — I10 Essential (primary) hypertension: Secondary | ICD-10-CM | POA: Diagnosis not present

## 2020-08-23 MED ORDER — DENOSUMAB 60 MG/ML ~~LOC~~ SOSY
60.0000 mg | PREFILLED_SYRINGE | Freq: Once | SUBCUTANEOUS | Status: AC
  Administered 2020-08-23: 60 mg via SUBCUTANEOUS

## 2020-08-23 NOTE — Assessment & Plan Note (Signed)
Chronic Sugars have been controlled Low sugar / carb diet Stressed regular exercise

## 2020-08-23 NOTE — Assessment & Plan Note (Signed)
Chronic BP well controlled Continue losartan 100 mg daily, metoprolol 50 mg daily

## 2020-08-23 NOTE — Assessment & Plan Note (Signed)
Chronic GERD controlled Continue protonix 40 mg BID

## 2020-08-23 NOTE — Assessment & Plan Note (Signed)
Chronic lipids controlled Continue atorvastatin 40 mg 3-4 times a week Regular exercise and healthy diet encouraged

## 2020-08-23 NOTE — Assessment & Plan Note (Signed)
Chronic occ headaches fioricet or tylenol as needed

## 2020-08-27 ENCOUNTER — Other Ambulatory Visit: Payer: Self-pay | Admitting: Family Medicine

## 2020-09-08 ENCOUNTER — Other Ambulatory Visit: Payer: Self-pay | Admitting: Internal Medicine

## 2020-10-01 DIAGNOSIS — M545 Low back pain, unspecified: Secondary | ICD-10-CM | POA: Diagnosis not present

## 2020-10-15 ENCOUNTER — Other Ambulatory Visit: Payer: Self-pay | Admitting: Internal Medicine

## 2020-10-24 ENCOUNTER — Telehealth: Payer: Self-pay | Admitting: Pharmacist

## 2020-10-24 NOTE — Progress Notes (Addendum)
    Chronic Care Management Pharmacy Assistant   Name: [name] [name]  MRN: [id number] DOB: Oct 22, 1946  Reason for Encounter: Adherence Medication Call   PCP : Binnie Rail, MD  Allergies:  No Known Allergies  Medications: Outpatient Encounter Medications as of 10/24/2020  Medication Sig Note   atorvastatin (LIPITOR) 40 MG tablet Take 1/2 (one-half) tablet by mouth once daily 08/09/2020: Patient takes 3-4 days per week   butalbital-acetaminophen-caffeine (FIORICET) 50-325-40 MG tablet TAKE 1 TABLET BY MOUTH EVERY 4 HOURS AS NEEDED FOR HEADACHE    calcium carbonate (OS-CAL) 600 MG tablet Take 1 tablet (600 mg total) by mouth daily.    Cholecalciferol (VITAMIN D3) 1000 units CAPS Take 1,000 Units by mouth 3 (three) times a week.     cyanocobalamin 1000 MCG tablet Take 1,000 mcg by mouth daily.    dicyclomine (BENTYL) 10 MG capsule Take 1 capsule (10 mg total) by mouth every 8 (eight) hours as needed for spasms.    gabapentin (NEURONTIN) 100 MG capsule TAKE 2 CAPSULES BY MOUTH AT BEDTIME    losartan (COZAAR) 100 MG tablet Take 1 tablet by mouth once daily    metoprolol succinate (TOPROL-XL) 50 MG 24 hr tablet Take 1 tablet by mouth once daily    pantoprazole (PROTONIX) 40 MG tablet Take 1 tablet (40 mg total) by mouth 2 (two) times daily before a meal.    No facility-administered encounter medications on file as of 10/24/2020.    Current Diagnosis: Patient Active Problem List   Diagnosis Date Noted   Left lower quadrant abdominal pain 05/01/2020   Acute post-traumatic headache, not intractable 11/15/2019   Prediabetes 05/02/2019   Carbuncle of groin 02/25/2019   Skin abnormalities 05/07/2018   Bilateral shoulder pain 12/11/2017   Gastritis 12/11/2017   Trigger finger, right little finger 09/11/2017   Numbness and tingling in left hand 07/20/2017   Dupuytren's contracture of hand 07/20/2017   CTS (carpal tunnel syndrome) 07/17/2017   Bilateral carpal tunnel syndrome 07/17/2017     Arthralgia 06/30/2017   Leg cramps 06/10/2017   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   Chronic midline low back pain without sciatica 09/15/2016   Osteopenia 07/20/2016   Bilateral finger numbness 06/03/2016   Joint pain 06/03/2016   Atypical chest pain 02/09/2015   Hyperlipidemia 02/09/2015   Bilateral primary osteoarthritis of knee 10/03/2013   Lateral meniscal tear 10/03/2013   Patellofemoral pain syndrome 10/03/2013   Varicose vein of leg    Allergic rhinitis    IBS (irritable bowel syndrome) 04/07/2011   Epigastric pain 12/26/2010   Diarrhea 12/25/2010   Migraine headache 09/27/2010   Essential hypertension 09/27/2010   GERD 09/27/2010   RENAL CALCULUS, HX OF 09/27/2010    Goals Addressed   None     Follow-Up:  Pharmacist Review   Called [facility] pharmacy to check to see if that if the patient has picked up her Atorvastatin recently after speaking with the pharmacy I was informed that she has not picked her medication up since June.   Left message for patient to refill atorvastatin as soon as possible.

## 2020-11-30 NOTE — Progress Notes (Signed)
Due to the COVID-19 crisis, this virtual check-in visit was done via telephone from my office and it was initiated and consent given by this patient and or family.  Telephone (Audio) Visit We were unable to connect for a virtual video visit and thus had to switch to a phone visit.  The purpose of this telephone visit is to provide medical care while limiting exposure to the novel coronavirus.    Consent was obtained for telephone visit and initiated by pt/family:  Yes.   Answered questions that patient had about telehealth interaction:  Yes.   I discussed the limitations, risks, security and privacy concerns of performing an evaluation and management service by telephone. I also discussed with the patient that there may be a patient responsible charge related to this service. The patient expressed understanding and agreed to proceed.  Pt location: Home Physician Location: office Name of referring provider:  Binnie Rail, MD I connected with .[name] [name] at patients initiation/request on 12/03/2020 at  8:30 AM EST by telephone and verified that I am speaking with the correct person using two identifiers.  Pt MRN:  [id number] Pt DOB:  09-16-1946  Assessment/Plan:   Cervicogenic headache/cervicalgia - likely musculoskeletal  1.  Gabapentin 100mg  at bedtime for a week, then increase to 200mg  at bedtime.  If no improvement in 3 weeks, we can increase dose 2.  Refer to physical therapy for neck pain 3.  Follow up 4 to 6 months.  Need for in person visit now:  No.  Subjective:  [name] [name] is a 75 year old right-handed woman with hypertension, migraines and carpal tunnel syndrome who follows up for headache.  UPDATE: Symptoms started 4 to 5 months ago.  It is a dull persistent bilateral pain in back of neck radiating up both occipital regions to top of head.  Pain aggravated turning head to either side or looking up.  She feels a soft "bump" on both sides of posterior head. No  paresthesias on back of head.  No radicular pain down the arms.  No preceding neck/head trauma or strenuous activity involving the neck.  She denies prior history of neck pain.  She has been treating with Tylenol and gabapentin.  She takes 100mg  once in awhile  CT head on 11/15/2019 Showed calcified 1.2 x 1.3 cm meningioma over the left frontal convexities.  Current analgesics:  Tylenol, Fioricet Current Antihypertensive medications:  Toprol-XL, losartan Current antiepileptic:  Gabapentin 100mg  PRN  Caffeine:  1 cup coffee daily.  Rarely tea or soda. Diet:  She tries drinking plenty of water during the day.  She does not skip meals Depression:  no; Anxiety:  no Other pain:  Knee pain Sleep hygiene:  Wakes up frequently during the night  HISTORY: Initially saw her for headache in 2020: Onset:  She has history of migraines for many years.  She presents for this different headache which began in her 2s. Location:  Left greater than right temple Quality:  Electric shock Intensity:  10/10.  She denies new headache, thunderclap headache or severe headache that wakes her from sleep. Associated symptoms:  Usually no associated symptoms.  Sometimes there is associated conjunctival injection on side of pain.  Photophobia as well.  No associated neck pain.  She denies associated phonophobia, osmophobia, nausea, vomiting, visual disturbance or unilateral numbness or weakness. Duration:  5 seconds Frequency:  2 to 3 times a day and it may occur again in 2 to 4 weeks. 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   Past NSAIDS:  ibuprofen  Past analgesics:  tramadol Past antihypertensive medications:  clonidine Past antidepressant medications:  none Past anticonvulsant medications:  gabapentin  Family history of headache:  No  Neuralgia,  probably residual from prior TMJ inflammation    Objective:   There were no vitals filed for this visit.   Follow Up Instructions:      -I discussed the assessment and treatment plan with the patient. The patient was provided an opportunity to ask questions and all were answered. The patient agreed with the plan and demonstrated an understanding of the instructions.   The patient was advised to call back or seek an in-person evaluation if the symptoms worsen or if the condition fails to improve as anticipated.    Total Time spent in visit with the patient was:  11 minutes   Dudley Major, DO

## 2020-12-03 ENCOUNTER — Telehealth (INDEPENDENT_AMBULATORY_CARE_PROVIDER_SITE_OTHER): Payer: PPO | Admitting: Neurology

## 2020-12-03 ENCOUNTER — Encounter: Payer: Self-pay | Admitting: Neurology

## 2020-12-03 ENCOUNTER — Other Ambulatory Visit: Payer: Self-pay

## 2020-12-03 DIAGNOSIS — R519 Headache, unspecified: Secondary | ICD-10-CM

## 2020-12-03 DIAGNOSIS — M542 Cervicalgia: Secondary | ICD-10-CM

## 2020-12-03 MED ORDER — GABAPENTIN 100 MG PO CAPS: ORAL_CAPSULE | ORAL | 0 refills | Status: DC

## 2020-12-03 NOTE — Addendum Note (Signed)
Addended by: Venetia Night on: 12/03/2020 08:55 AM   Modules accepted: Orders

## 2020-12-03 NOTE — Patient Instructions (Signed)
1.  Gabapentin 100mg  at bedtime for a week, then increase to 200mg  at bedtime.  If no improvement in 3 weeks, contact me and we can increase dose 2.  Refer to physical therapy for neck pain

## 2020-12-06 ENCOUNTER — Other Ambulatory Visit: Payer: Self-pay | Admitting: Internal Medicine

## 2020-12-10 ENCOUNTER — Ambulatory Visit: Payer: PPO | Admitting: Physical Therapy

## 2020-12-24 ENCOUNTER — Ambulatory Visit: Payer: PPO | Admitting: Gastroenterology

## 2020-12-24 ENCOUNTER — Encounter: Payer: Self-pay | Admitting: Gastroenterology

## 2020-12-24 VITALS — BP 104/60 | HR 42 | Ht 63.0 in | Wt 140.0 lb

## 2020-12-24 DIAGNOSIS — K580 Irritable bowel syndrome with diarrhea: Secondary | ICD-10-CM

## 2020-12-24 DIAGNOSIS — K219 Gastro-esophageal reflux disease without esophagitis: Secondary | ICD-10-CM

## 2020-12-24 MED ORDER — DICYCLOMINE HCL 10 MG PO CAPS: 10.0000 mg | ORAL_CAPSULE | Freq: Three times a day (TID) | ORAL | 1 refills | Status: DC | PRN

## 2020-12-24 MED ORDER — PANTOPRAZOLE SODIUM 40 MG PO TBEC: 40.0000 mg | DELAYED_RELEASE_TABLET | Freq: Every day | ORAL | 0 refills | Status: DC

## 2020-12-24 MED ORDER — IBGARD 90 MG PO CPCR: ORAL_CAPSULE | ORAL | 0 refills | Status: DC

## 2020-12-24 MED ORDER — LOPERAMIDE HCL 2 MG PO TABS: ORAL_TABLET | ORAL | 0 refills | Status: DC

## 2020-12-24 NOTE — Patient Instructions (Addendum)
If you are age 76 or older, your body mass index should be between 23-30. Your Body mass index is 24.8 kg/m. If this is out of the aforementioned range listed, please consider follow up with your Primary Care Provider.  If you are age 40 or younger, your body mass index should be between 19-25. Your Body mass index is 24.8 kg/m. If this is out of the aformentioned range listed, please consider follow up with your Primary Care Provider.   We are giving you a Low FOD-Map diet to follow today.  Take 1/2 tablet of Imodium every morning and titrate as needed.  We have given you samples of the following medication to take: IBgard: Take as directed  We have sent the following medications to your pharmacy for you to pick up at your convenience: Bentyl 10 mg: Take every 8 hours as needed  Decrease pantoprazole 40 mg to once daily.  You have been scheduled for a follow up appointment on Tuesday, 02-19-21 at 10:30am.   Thank you for entrusting me with your care and for choosing [facility], Dr. [place] Cellar

## 2020-12-24 NOTE — Progress Notes (Signed)
HPI :  75 year old female here for history of GERD, and IBS, here to reassess for these issues.  I last saw her in September 2021 for worsening bowel problems as well as longstanding reflux and we decided to pursue EGD and colonoscopy as outlined below.  In short she had a small hiatal hernia, no significant pathology in the upper GI tract, biopsies unremarkable.  Her colon was normal as was her ileum.  Biopsies of the colon showed no evidence of microscopic colitis. She had a negative GI pathogen panel.  I had suspected she probably has some underlying IBS that was made worse after she was visiting [place] earlier in the year.  It sounds like she had a severe infectious diarrheal illness there and has not recovered from it clinically.  I had recommended some Imodium, Pepto-Bismol initially, and Bentyl as needed.  She has not really tried Imodium or anything for her diarrhea yet.  She does take Bentyl and that does help her symptoms when she takes it.  She has anywhere from 3-8 bowel movements per day.  Stool is usually loose to soft and formed.  Symptoms in the daytime, rarely ever nocturnal symptoms.  She has some mid left to diffuse lower abdominal cramping associated with bowel movements and this resolves after bowel movement and is short-lived.  She historically has required high-dose Protonix to control her reflux symptoms.  She continues to take 40 mg dose twice daily.  She did not have any Barrett's esophagus or ulcers on her endoscopy.  She states has been working pretty well for her not really having much of heartburn lately.  We discussed long-term risks of chronic PPI use.    She does inquire about alternative medicine.  She states she was seen by a "natural physician", who did food allergy blood testing and was told by that provider to see a chiropractor for these issues.  She states she was charged a fair amount of money for this and inquires if she should pursue this further.  She inquires  about dietary changes in relation to her symptoms otherwise.  Endoscopic history: EGD 04/12/2012 - 3cm HH, otherwise normal, clo test negative Colonoscopy 04/12/2012 - diverticulosis, no polyps EGD 10/2008 - mild Shatski ring dilated to 54Fr Venia Minks, small HH  CT scan 01/05/2018 - multiple hepatic cysts, mild diffuse hepatic steatosis, normal GB, normal pancreas. "Mild enhancement" of left colon, nonspecific  HIDA 01/01/2017 - normal [place] RUQ 12/26/2016 - no gallstones  CT scan 05/03/20 - IMPRESSION: 1. No diverticulitis. 2. No explanation for abdominal pain. 3. Small hiatal hernia. 4. Benign-appearing cysts of the liver and kidneys.  CBC and LFTs normal  GI pathogen panel 07/26/20 negative  Colonoscopy 08/06/20 -  - The terminal ileum appeared normal. - A localized area of mucosa was found in the distal rectum / dentate line consistent with focal prolapse change. Biopsies were taken with a cold forceps for histology to ensure no adenomatous changes. - Internal hemorrhoids were found during retroflexion. - The exam was otherwise without abnormality. 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.  EGD 08/06/20  - A 2 cm hiatal hernia was present. - The exam of the esophagus was otherwise normal. - Multiple small benign appearing sessile polyps were found in the gastric fundus and in the gastric body. Biopsies were taken with a cold forceps for histology and a few of  them were removed as representative sample/ - Diffuse atrophic mucosa was found in the entire examined stomach. Biopsies were taken from antrum / body with a cold forceps for Helicobacter pylori testing and to rule out GIM. - The exam of the stomach was otherwise normal. - The duodenal bulb and second portion of the duodenum were normal. Biopsies for histology were taken with a cold forceps for  evaluation of celiac disease.  1. Surgical [P], duodenal bulb, 2nd portion of duodenum and distal duodenum - DUODENAL MUCOSA WITH NO SIGNIFICANT PATHOLOGIC FINDINGS. - NEGATIVE FOR INCREASED INTRAEPITHELIAL LYMPHOCYTES AND VILLOUS ARCHITECTURAL CHANGES. 2. Surgical [P], gastric antrum and gastric body - GASTRIC ANTRAL MUCOSA WITH MILD REACTIVE GASTROPATHY. 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 - WARTHIN-STARRY STAIN IS NEGATIVE FOR HELICOBACTER PYLORI. 3. Surgical [P], gastric polyps - FUNDIC GLAND POLYPS. 4. Surgical [P], random colon bx - COLONIC MUCOSA WITH NO SIGNIFICANT PATHOLOGIC FINDINGS. - NEGATIVE FOR ACTIVE INFLAMMATION AND OTHER ABNORMALITIES. 5. Surgical [P], colon, rectum - ULCER. - NEGATIVE FOR DYSPLASIA.    Past Medical History:  Diagnosis Date  . Arthritis   . Atypical chest pain   . Diverticulosis   . Gastritis 11/08/2007, 04/12/2012   H pylori bx neg 03/2012  . GERD (gastroesophageal reflux disease) 11/08/2007, 04/12/2012  . Hepatic cyst   . Hiatal hernia 11/08/2007, 04/12/2012  . History of kidney stones   . Hyperlipidemia   . Hypertension   . IBS (irritable bowel syndrome)    diarrhea predom  . Migraine   . Osteoporosis 09/27/2010   DEXA 01/21/2012: -2.1 spine and R fem, -1.8 L fem s/p fosamax  2004-2011 DEXA 04/18/14 @ [facility]: -2.5 - rec to start Prolia    . PVC's (premature ventricular contractions)   . 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 Shingles outbreak 04/2013     Past Surgical History:  Procedure Laterality Date  . APPENDECTOMY  1958  . COLONOSCOPY    . CYSTOCELE REPAIR  2008  . ESOPHAGOGASTRODUODENOSCOPY    . Maxilofacial  1992  . TONSILLECTOMY  1960   Family History  Problem Relation Age of Onset  . Hypertension Mother   . Alzheimer's disease Mother 3  . AAA (abdominal aortic aneurysm) Mother   . Stroke Father 11  . Kidney cancer Brother        mets  . Bone cancer Brother 29  . Colon  cancer Neg Hx    Social History   Tobacco Use  . Smoking status: Never Smoker  . Smokeless tobacco: Never Used  Vaping Use  . Vaping Use: Never used  Substance Use Topics  . Alcohol use: No  . Drug use: No   Current Outpatient Medications  Medication Sig Dispense Refill  . atorvastatin (LIPITOR) 40 MG tablet Take 1/2 (one-half) tablet by mouth once daily 45 tablet 0  . butalbital-acetaminophen-caffeine (FIORICET) 50-325-40 MG tablet TAKE 1 TABLET BY MOUTH EVERY 4 HOURS AS NEEDED FOR HEADACHE 30 tablet 0  . calcium carbonate (OS-CAL) 600 MG tablet Take 1 tablet (600 mg total) by mouth daily. 60 tablet   . Cholecalciferol (VITAMIN D3) 1000 units CAPS Take 1,000 Units by mouth 3 (three) times a week.     . cyanocobalamin 1000 MCG tablet Take 1,000 mcg by mouth daily.    Marland Kitchen dicyclomine (BENTYL) 10 MG capsule Take 1 capsule (10 mg total) by mouth every 8 (eight) hours as needed for spasms. 60 capsule 0  . gabapentin (NEURONTIN) 100 MG  capsule Take 1 capsule at bedtime for a week, then increase to 2 capsules at bedtime. 60 capsule 0  . losartan (COZAAR) 100 MG tablet Take 1 tablet by mouth once daily 90 tablet 0  . metoprolol succinate (TOPROL-XL) 50 MG 24 hr tablet Take 1 tablet by mouth once daily 90 tablet 0  . pantoprazole (PROTONIX) 40 MG tablet TAKE 1 TABLET BY MOUTH TWICE DAILY BEFORE MEAL(S) 180 tablet 0   No current facility-administered medications for this visit.   No Known Allergies   Review of Systems: All systems reviewed and negative except where noted in HPI.   Lab Results  Component Value Date   WBC 7.5 05/01/2020   HGB 13.4 05/01/2020   HCT 39.6 05/01/2020   MCV 100.3 (H) 05/01/2020   PLT 213.0 05/01/2020    Lab Results  Component Value Date   CREATININE 0.83 05/01/2020   BUN 20 05/01/2020   NA 139 05/01/2020   K 4.5 05/01/2020   CL 104 05/01/2020   CO2 32 05/01/2020    Lab Results  Component Value Date   ALT 15 05/01/2020   AST 19 05/01/2020    ALKPHOS 40 05/01/2020   BILITOT 0.5 05/01/2020     Physical Exam: BP 104/60   Pulse (!) 42   Ht 5\' 3"  (1.6 m)   Wt 140 lb (63.5 kg) Comment: with her knee braces  BMI 24.80 kg/m  Constitutional: Pleasant,well-developed, female in no acute distress. Neurological: Alert and oriented to person place and time. Psychiatric: Normal mood and affect. Behavior is normal.   ASSESSMENT AND PLAN: 75 year old female here for reassessment the following issues:  IBS-D GERD  Patient has some chronic symptoms suggestive of IBS acutely made worse after acute diarrheal illness while traveling in [place].  Since that time she has had persistent loose stools and abdominal cramping.  She has had a CT scan without any concerning findings.  Her labs are normal.  She had a colonoscopy with me which showed no evidence of colitis or microscopic colitis.  Her pain is reliably relieved with a bowel movement, she responds appropriately to Bentyl but not taking it much at all.  Otherwise not taking any medication for her loose stools.  She inquires about food allergies, had recent testing done by had "natural physician" who had recommended chiropractor evaluation for this and she asked my opinion on that today.  I told her it is quite possible she has food intolerances that could be making underlying IBS worse, I do not think she has true food allergies driving this process.  She tested negative for celiac disease with biopsies.  Further, I would not recommend serologic blood testing to assess for true food allergies, and I would not recommend chiropractor evaluation for her chronic diarrhea.  I discussed functional bowel disorder and IBS with the patient and her husband at length.  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  Discussed options to treat this which include dietary therapy as well as medications.  I counseled him on a low  FODMAP diet and provided handouts, hopefully that will help her if she is eating any high risk foods to contribute to this.  She responds well to Bentyl and recommend she can take this every 8 hours as needed.  I also provided her some IBgard samples to see if peppermint supplementation will provide any benefit as well.  I do think she would  benefit from antidiarrheal regimen on a routine basis, she is concerned about constipation.  After discussion of options she will try half a tablet of Imodium every morning and can titrate that up as needed.  If she finds this is constipating her she can use it less frequently.  If that does not help I asked her to contact me and we can try other regimens such as Colestid.  I would like to see her back in 2 months for reassessment, counseled her we may need to tweak her regimen moving forward but hopefully we can continue to make improvements here over time.  She agreed.  Otherwise in regards to her chronic reflux, she has no evidence of Barrett's esophagus, symptoms fairly well controlled at this point but she continues to take high-dose PPI.  We discussed long-term risks of chronic PPI therapy and recommend she use the lowest dose needed to control her symptoms.  She will reduce to 40 mg once daily on try to titrate lower if tolerated.  Plan: - low FODMAP diet  - bentyl 10mg  every 8 hours PRN - IB gard PRN for spasm - immodium 1/2 tab q AM and titrate up or down as needed - reduce protonix to 40mg  once daily - follow up in 2 months, call with questions in the interim.  Mineral Cellar, MD [facility]

## 2021-01-08 ENCOUNTER — Telehealth: Payer: Self-pay | Admitting: Pharmacist

## 2021-01-08 NOTE — Progress Notes (Addendum)
° ° °  Chronic Care Management Pharmacy Assistant   Name: [name] [name]  MRN: [id number] DOB: 1945/12/23  Reason for Encounter: On Boarding    PCP : Binnie Rail, MD  Allergies:  No Known Allergies  Medications: Outpatient Encounter Medications as of 01/08/2021  Medication Sig   atorvastatin (LIPITOR) 40 MG tablet Take 1/2 (one-half) tablet by mouth once daily   butalbital-acetaminophen-caffeine (FIORICET) 50-325-40 MG tablet TAKE 1 TABLET BY MOUTH EVERY 4 HOURS AS NEEDED FOR HEADACHE   calcium carbonate (OS-CAL) 600 MG tablet Take 1 tablet (600 mg total) by mouth daily.   Cholecalciferol (VITAMIN D3) 1000 units CAPS Take 1,000 Units by mouth 3 (three) times a week.    cyanocobalamin 1000 MCG tablet Take 1,000 mcg by mouth daily.   dicyclomine (BENTYL) 10 MG capsule Take 1 capsule (10 mg total) by mouth every 8 (eight) hours as needed for spasms.   gabapentin (NEURONTIN) 100 MG capsule Take 1 capsule at bedtime for a week, then increase to 2 capsules at bedtime.   loperamide (IMODIUM A-D) 2 MG tablet Take 1/2 tablet every morning and titrate as needed   losartan (COZAAR) 100 MG tablet Take 1 tablet by mouth once daily   metoprolol succinate (TOPROL-XL) 50 MG 24 hr tablet Take 1 tablet by mouth once daily   pantoprazole (PROTONIX) 40 MG tablet Take 1 tablet (40 mg total) by mouth daily.   Peppermint Oil (IBGARD) 90 MG CPCR Take as directed   No facility-administered encounter medications on file as of 01/08/2021.    Current Diagnosis: Patient Active Problem List   Diagnosis Date Noted   Left lower quadrant abdominal pain 05/01/2020   Acute post-traumatic headache, not intractable 11/15/2019   Prediabetes 05/02/2019   Carbuncle of groin 02/25/2019   Skin abnormalities 05/07/2018   Bilateral shoulder pain 12/11/2017   Gastritis 12/11/2017   Trigger finger, right little finger 09/11/2017   Numbness and tingling in left hand 07/20/2017   Dupuytren's contracture of hand  07/20/2017   CTS (carpal tunnel syndrome) 07/17/2017   Bilateral carpal tunnel syndrome 07/17/2017   Arthralgia 06/30/2017   Leg cramps 06/10/2017   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   Chronic midline low back pain without sciatica 09/15/2016   Osteopenia 07/20/2016   Bilateral finger numbness 06/03/2016   Joint pain 06/03/2016   Atypical chest pain 02/09/2015   Hyperlipidemia 02/09/2015   Bilateral primary osteoarthritis of knee 10/03/2013   Lateral meniscal tear 10/03/2013   Patellofemoral pain syndrome 10/03/2013   Varicose vein of leg    Allergic rhinitis    IBS (irritable bowel syndrome) 04/07/2011   Epigastric pain 12/26/2010   Diarrhea 12/25/2010   Migraine headache 09/27/2010   Essential hypertension 09/27/2010   GERD 09/27/2010   RENAL CALCULUS, HX OF 09/27/2010    Goals Addressed   None     Follow-Up:  Pharmacist Review   The patient agreed to try [facility] services for their dispensing and delivery of medications. Per clinical pharmacist request I completed an on-boarding form with the list of patients medications, current pharmacy, demographics, allergies and insurance information. The form was then forward to the clinical pharmacist for review.   Wendy Poet, Clinical Pharmacist Assistant [facility] [phone]  Total time:30

## 2021-01-22 ENCOUNTER — Other Ambulatory Visit: Payer: Self-pay | Admitting: Neurology

## 2021-01-24 ENCOUNTER — Ambulatory Visit (INDEPENDENT_AMBULATORY_CARE_PROVIDER_SITE_OTHER): Payer: PPO | Admitting: Pharmacist

## 2021-01-24 ENCOUNTER — Other Ambulatory Visit: Payer: Self-pay

## 2021-01-24 DIAGNOSIS — E782 Mixed hyperlipidemia: Secondary | ICD-10-CM | POA: Diagnosis not present

## 2021-01-24 DIAGNOSIS — K580 Irritable bowel syndrome with diarrhea: Secondary | ICD-10-CM

## 2021-01-24 DIAGNOSIS — M810 Age-related osteoporosis without current pathological fracture: Secondary | ICD-10-CM | POA: Diagnosis not present

## 2021-01-24 DIAGNOSIS — I10 Essential (primary) hypertension: Secondary | ICD-10-CM | POA: Diagnosis not present

## 2021-01-24 NOTE — Patient Instructions (Signed)
Visit Information  Phone number for Pharmacist: [phone]  Goals Addressed   None    Patient Care Plan: CCM Pharmacy Care Plan    Problem Identified: Hypertension, Hyperlipidemia and Osteoporosis, IBS, Neck pain   Priority: High    Long-Range Goal: Disease management   Start Date: 01/24/2021  Expected End Date: 01/24/2022  This Visit's Progress: On track  Priority: High  Note:   Current Barriers:  . Unable to independently monitor therapeutic efficacy . Suboptimal pharmacy services - lack of adherence packaging and delivery  Pharmacist Clinical Goal(s):  Marland Kitchen Over the next 180 days, patient will achieve adherence to monitoring guidelines and medication adherence to achieve therapeutic efficacy . begin enhanced pharmacy services through collaboration with PharmD and provider.   Interventions: . 1:1 collaboration with Binnie Rail, MD regarding development and update of comprehensive plan of care as evidenced by provider attestation and co-signature . Inter-disciplinary care team collaboration (see longitudinal plan of care) . Comprehensive medication review performed; medication list updated in electronic medical record  Hypertension (BP goal < 130/80) Controlled - per recent office visits Current regimen:  ? Losartan 100 mg daily ? Metoprolol succinate 50 mg - 1/2 tab BID Interventions: ? Discussed BP goals and benefits of medications for prevention of heart attack / stroke ? Recommend to continue current medication   Hyperlipidemia (LDL goal < 100) Controlled - most recent LDL at goal, patient denies side effects Current regimen:  ? Atorvastatin 40 mg - 1/2 tab daily Interventions: ? Discussed cholesterol goals and benefits of medications for prevention of heart attack / stroke ? Discussed importance of taking atorvastatin daily to receive the full benefit for risk reduction ? Recommend to continue current medication   Osteoporosis Controlled - pt is on schedule with  Prolia injections, office visit scheduled later this month Current regimen:  o Calcium carbonate 600 mg daily o Vitamin D 1000 IU 3 x per week o Prolia 60 mg q6 months (last given 08/09/20) Interventions: o Recommend to continue current medication  Neck pain Improved control - per patient gabapentin has helped with relaxation, although neck pain is still present Current regimen:  o Gabapentin 100 mg - titrate up to 2 cap HS Interventions: o Recommend to continue current medication  IBS-D Improved control - pt reports medication changes discussed with GI have helped; IBgard in particular has improved symptoms Current regimen:  o Dicyclomine 10 mg TID prn o Loperamide 2 mg PRN o IBgard PRN (peppermint oil) Interventions: o Recommend to continue current medication  Patient Goals/Self-Care Activities . Over the next 180 days, patient will:  - take medications as prescribed focus on medication adherence by pill packs  Follow Up Plan: Telephone follow up appointment with care management team member scheduled for: 6 months      The patient verbalized understanding of instructions, educational materials, and care plan provided today and declined offer to receive copy of patient instructions, educational materials, and care plan.  Telephone follow up appointment with pharmacy team member scheduled for: 6 months  Charlene Brooke, PharmD, [facility] Clinical Pharmacist [facility] [phone]

## 2021-01-24 NOTE — Progress Notes (Signed)
Chronic Care Management Pharmacy Note  01/24/2021 Name:  Dayton Sherr MRN:  [id number] DOB:  [birth year]  Subjective: [name] [name] is an 75 y.o. year old female who is a primary patient of Burns, Claudina Lick, MD.  The CCM team was consulted for assistance with disease management and care coordination needs.    Engaged with patient by telephone for follow up visit in response to provider referral for pharmacy case management and/or care coordination services.   Consent to Services:  The patient was given the following information about Chronic Care Management services today, agreed to services, and gave verbal consent: 1. CCM service includes personalized support from designated clinical staff supervised by the primary care provider, including individualized plan of care and coordination with other care providers 2. 24/7 contact phone numbers for assistance for urgent and routine care needs. 3. Service will only be billed when office clinical staff spend 20 minutes or more in a month to coordinate care. 4. Only one practitioner may furnish and bill the service in a calendar month. 5.The patient may stop CCM services at any time (effective at the end of the month) by phone call to the office staff. 6. The patient will be responsible for cost sharing (co-pay) of up to 20% of the service fee (after annual deductible is met). Patient agreed to services and consent obtained.  Patient Care Team: Binnie Rail, MD as PCP - General (Internal Medicine) Biagio Borg, MD as PCP - Family Medicine (Internal Medicine) Sable Feil, MD as Consulting Physician (Gastroenterology) Lyndal Pulley, DO (Sports Medicine) Lorretta Harp, MD (Cardiology) Pieter Partridge, DO as Consulting Physician (Neurology) Charlton Haws, [facility] as Pharmacist (Pharmacist)  Recent office visits: 08/23/20 Dr Quay Burow OV: chronic f/u. Prolia injection given. rx'd gabapentin, referred to PT for neck pain.  Recent  consult visits: 12/24/20 Dr Havery Moros (GI): worsening IBS-D after acute illness while travelling in [place],, Rx'd loperamide, peppermint oil, reduced pantoprazole to daily. Rec'd dicyclomine TID prn, low FODMAP diet, IBgard PRN.  12/03/20 Dr Tomi Likens (neurology): cervicalgia, headches. Referred to PT  08/06/20 colonoscopy  Hospital visits: None in previous 6 months  Objective:  Lab Results  Component Value Date   CREATININE 0.83 05/01/2020   BUN 20 05/01/2020   GFR 67.03 05/01/2020   GFRNONAA 86.29 11/01/2010   NA 139 05/01/2020   K 4.5 05/01/2020   CALCIUM 9.5 05/01/2020   CO2 32 05/01/2020    Lab Results  Component Value Date/Time   HGBA1C 5.6 11/02/2019 10:06 AM   HGBA1C 5.7 05/02/2019 10:30 AM   GFR 67.03 05/01/2020 10:43 AM   GFR 66.15 02/20/2020 10:44 AM    Last diabetic Eye exam: No results found for: HMDIABEYEEXA  Last diabetic Foot exam: No results found for: HMDIABFOOTEX   Lab Results  Component Value Date   CHOL 144 11/02/2019   HDL 43.80 11/02/2019   LDLCALC 82 05/02/2019   LDLDIRECT 75.0 11/02/2019   TRIG 299.0 (H) 11/02/2019   CHOLHDL 3 11/02/2019    Hepatic Function Latest Ref Rng & Units 05/01/2020 02/20/2020 11/02/2019  Total Protein 6.0 - 8.3 g/dL 6.5 6.7 6.7  Albumin 3.5 - 5.2 g/dL 4.0 4.1 4.1  AST 0 - 37 U/L 19 19 17   ALT 0 - 35 U/L 15 14 13   Alk Phosphatase 39 - 117 U/L 40 37(L) 45  Total Bilirubin 0.2 - 1.2 mg/dL 0.5 0.5 0.5  Bilirubin, Direct 0.0 - 0.3 mg/dL - - -    Lab  Results  Component Value Date/Time   TSH 0.72 02/20/2020 10:44 AM   TSH 0.45 05/02/2019 10:30 AM    CBC Latest Ref Rng & Units 05/01/2020 02/20/2020 05/02/2019  WBC 4.0 - 10.5 K/uL 7.5 7.4 8.1  Hemoglobin 12.0 - 15.0 g/dL 13.4 13.6 13.9  Hematocrit 36.0 - 46.0 % 39.6 40.4 40.8  Platelets 150.0 - 400.0 K/uL 213.0 216.0 215.0    Lab Results  Component Value Date/Time   VD25OH 76.73 02/20/2020 10:44 AM   VD25OH 48.4 06/30/2017 08:43 AM   VD25OH 51 01/21/2012 08:16 AM     Clinical ASCVD: No  The 10-year ASCVD risk score Mikey Bussing DC Jr., et al., 2013) is: 13.9%   Values used to calculate the score:     Age: 5 years     Sex: Female     Is Non-Hispanic African American: No     Diabetic: No     Tobacco smoker: No     Systolic Blood Pressure: 494 mmHg     Is BP treated: Yes     HDL Cholesterol: 43.8 mg/dL     Total Cholesterol: 144 mg/dL    Depression screen [facility] 2/9 04/24/2020 05/02/2019 07/20/2017  Decreased Interest 0 0 0  Down, Depressed, Hopeless 0 0 0  PHQ - 2 Score 0 0 0     Social History   Tobacco Use  Smoking Status Never Smoker  Smokeless Tobacco Never Used   BP Readings from Last 3 Encounters:  12/24/20 104/60  08/23/20 120/72  08/06/20 128/64   Pulse Readings from Last 3 Encounters:  12/24/20 (!) 42  08/23/20 (!) 54  08/06/20 68   Wt Readings from Last 3 Encounters:  12/24/20 140 lb (63.5 kg)  08/23/20 136 lb 3.2 oz (61.8 kg)  08/06/20 133 lb (60.3 kg)    Assessment/Interventions: Review of patient past medical history, allergies, medications, health status, including review of consultants reports, laboratory and other test data, was performed as part of comprehensive evaluation and provision of chronic care management services.   SDOH:  (Social Determinants of Health) assessments and interventions performed: Yes   SDOH Screenings   Alcohol Screen: Not on file  Depression (PHQ2-9): Low Risk   . PHQ-2 Score: 0  Financial Resource Strain: Low Risk   . Difficulty of Paying Living Expenses: Not hard at all  Food Insecurity: Not on file  Housing: Not on file  Physical Activity: Not on file  Social Connections: Not on file  Stress: Not on file  Tobacco Use: Low Risk   . Smoking Tobacco Use: Never Smoker  . Smokeless Tobacco Use: Never Used  Transportation Needs: Not on file    [place]  No Known Allergies  Medications Reviewed Today    Reviewed by Charlton Haws, [facility] (Pharmacist) on 01/24/21 at [facility]  List Status: <None>  Medication Order Taking? Sig Documenting Provider Last Dose Status Informant  atorvastatin (LIPITOR) 40 MG tablet [id number] Yes Take 1/2 (one-half) tablet by mouth once daily Binnie Rail, MD Taking Active   butalbital-acetaminophen-caffeine (FIORICET) 50-325-40 MG tablet [id number] Yes TAKE 1 TABLET BY MOUTH EVERY 4 HOURS AS NEEDED FOR HEADACHE Burns, Claudina Lick, MD Taking Active   calcium carbonate (OS-CAL) 600 MG tablet [id number] Yes Take 1 tablet (600 mg total) by mouth daily. Binnie Rail, MD Taking Active   Cholecalciferol (VITAMIN D3) 1000 units CAPS [id number] Yes Take 1,000 Units by mouth 3 (three) times a week.  [provider] Taking Active  cyanocobalamin 1000 MCG tablet [id number] Yes Take 1,000 mcg by mouth daily. [provider] Taking Active   dicyclomine (BENTYL) 10 MG capsule [id number] Yes Take 1 capsule (10 mg total) by mouth every 8 (eight) hours as needed for spasms. Yetta Flock, MD Taking Active   gabapentin (NEURONTIN) 100 MG capsule [id number] Yes Take  2 CAPSULES AT BEDTIME. Pieter Partridge, DO Taking Active   loperamide (IMODIUM A-D) 2 MG tablet [id number] Yes Take 1/2 tablet every morning and titrate as needed Armbruster, Carlota Raspberry, MD Taking Active   losartan (COZAAR) 100 MG tablet [id number] Yes Take 1 tablet by mouth once daily Burns, Claudina Lick, MD Taking Active   metoprolol succinate (TOPROL-XL) 50 MG 24 hr tablet [id number] Yes Take 1 tablet by mouth once daily Burns, Claudina Lick, MD Taking Active   pantoprazole (PROTONIX) 40 MG tablet [id number] Yes Take 1 tablet (40 mg total) by mouth daily. Yetta Flock, MD Taking Active   Peppermint Oil (IBGARD) 90 MG CPCR [id number] Yes Take as directed Armbruster, Carlota Raspberry, MD Taking Active           Patient Active Problem List   Diagnosis Date Noted  . Left lower quadrant abdominal pain 05/01/2020  . Acute post-traumatic headache, not intractable 11/15/2019  . Prediabetes 05/02/2019   . Carbuncle of groin 02/25/2019  . Skin abnormalities 05/07/2018  . Bilateral shoulder pain 12/11/2017  . Gastritis 12/11/2017  . Trigger finger, right little finger 09/11/2017  . Numbness and tingling in left hand 07/20/2017  . Dupuytren's contracture of hand 07/20/2017  . CTS (carpal tunnel syndrome) 07/17/2017  . Bilateral carpal tunnel syndrome 07/17/2017  . Arthralgia 06/30/2017  . Leg cramps 06/10/2017  . 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 Chronic midline low back pain without sciatica 09/15/2016  . Osteopenia 07/20/2016  . Bilateral finger numbness 06/03/2016  . Joint pain 06/03/2016  . Atypical chest pain 02/09/2015  . Hyperlipidemia 02/09/2015  . Bilateral primary osteoarthritis of knee 10/03/2013  . Lateral meniscal tear 10/03/2013  . 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 Varicose vein of leg   . Allergic rhinitis   . IBS (irritable bowel syndrome) 04/07/2011  . Epigastric pain 12/26/2010  . Diarrhea 12/25/2010  . Migraine headache 09/27/2010  . Essential hypertension 09/27/2010  . GERD 09/27/2010  . RENAL CALCULUS, HX OF 09/27/2010    Immunization History  Administered Date(s) Administered  . Fluad Quad(high Dose 65+) 11/02/2019  . Influenza Split 10/13/2011, 08/24/2012  . Influenza Whole 09/27/2010  . Influenza, High Dose Seasonal PF 10/02/2017  . Influenza,inj,Quad PF,6+ Mos 08/01/2013, 10/30/2015  . Influenza-Unspecified 09/22/2016  . PFIZER(Purple Top)SARS-COV-2 Vaccination 02/03/2020, 02/28/2020  . Pneumococcal Conjugate-13 04/16/2015  . Pneumococcal Polysaccharide-23 01/14/2012  . Tetanus 01/14/2012    Conditions to be addressed/monitored:  Hypertension, Hyperlipidemia and Osteoporosis, IBS, Neck pain  Care Plan : [place]  Updates made by Charlton Haws, [place] since 01/24/2021 12:00 AM    Problem: Hypertension, Hyperlipidemia and Osteoporosis, IBS, Neck pain   Priority: High    Long-Range Goal: Disease  management   Start Date: 01/24/2021  Expected End Date: 01/24/2022  This Visit's Progress: On track  Priority: High  Note:   Current Barriers:  . Unable to independently monitor therapeutic efficacy . Suboptimal pharmacy services - lack of adherence packaging and delivery  Pharmacist Clinical Goal(s):  Marland Kitchen Over the next 180 days, patient will achieve adherence to monitoring guidelines and medication adherence to achieve therapeutic efficacy .  begin enhanced pharmacy services through collaboration with PharmD and provider.   Interventions: . 1:1 collaboration with Binnie Rail, MD regarding development and update of comprehensive plan of care as evidenced by provider attestation and co-signature . Inter-disciplinary care team collaboration (see longitudinal plan of care) . Comprehensive medication review performed; medication list updated in electronic medical record  Hypertension (BP goal < 130/80) Controlled - per recent office visits Current regimen:  ? Losartan 100 mg daily ? Metoprolol succinate 50 mg - 1/2 tab BID Interventions: ? Discussed BP goals and benefits of medications for prevention of heart attack / stroke ? Recommend to continue current medication   Hyperlipidemia (LDL goal < 100) Controlled - most recent LDL at goal, patient denies side effects Current regimen:  ? Atorvastatin 40 mg - 1/2 tab daily Interventions: ? Discussed cholesterol goals and benefits of medications for prevention of heart attack / stroke ? Discussed importance of taking atorvastatin daily to receive the full benefit for risk reduction ? Recommend to continue current medication   Osteoporosis Controlled - pt is on schedule with Prolia injections, office visit scheduled later this month Current regimen:  o Calcium carbonate 600 mg daily o Vitamin D 1000 IU 3 x per week o Prolia 60 mg q6 months (last given 08/09/20) Interventions: o Recommend to continue current medication  Neck  pain Improved control - per patient gabapentin has helped with relaxation, although neck pain is still present Current regimen:  o Gabapentin 100 mg - titrate up to 2 cap HS Interventions: o Recommend to continue current medication  IBS-D Improved control - pt reports medication changes discussed with GI have helped; IBgard in particular has improved symptoms Current regimen:  o Dicyclomine 10 mg TID prn o Loperamide 2 mg PRN o IBgard PRN (peppermint oil) Interventions: o Recommend to continue current medication  Patient Goals/Self-Care Activities . Over the next 180 days, patient will:  - take medications as prescribed focus on medication adherence by pill packs  Follow Up Plan: Telephone follow up appointment with care management team member scheduled for: 6 months      Medication Assistance: Utilizing [facility] packaging and delivery.  Patient's preferred pharmacy is:  [facility], [place] [address] Phone: [phone] Fax: [phone]  Uses pill box? Yes Pt endorses 100% compliance  We discussed: Verbal consent obtained for [facility] enhanced pharmacy services (medication synchronization, adherence packaging, delivery coordination). A medication sync plan was created to allow patient to get all medications delivered once every 30 to 90 days per patient preference. Patient understands they have freedom to choose pharmacy and clinical pharmacist will coordinate care between all prescribers and [facility].  Patient decided to: Utilize [facility] pharmacy for medication synchronization, packaging and delivery  Care Plan and Follow Up Patient Decision:  Patient agrees to Care Plan and Follow-up.  Plan: Telephone follow up appointment with care management team member scheduled for:  6 months  Charlene Brooke, PharmD, [facility] Clinical  Pharmacist [facility] at [facility] [phone]

## 2021-02-02 ENCOUNTER — Encounter: Payer: Self-pay | Admitting: Internal Medicine

## 2021-02-02 ENCOUNTER — Encounter (HOSPITAL_BASED_OUTPATIENT_CLINIC_OR_DEPARTMENT_OTHER): Payer: Self-pay | Admitting: Emergency Medicine

## 2021-02-02 ENCOUNTER — Emergency Department (HOSPITAL_BASED_OUTPATIENT_CLINIC_OR_DEPARTMENT_OTHER): Payer: PPO

## 2021-02-02 ENCOUNTER — Other Ambulatory Visit: Payer: Self-pay

## 2021-02-02 ENCOUNTER — Emergency Department (HOSPITAL_BASED_OUTPATIENT_CLINIC_OR_DEPARTMENT_OTHER)
Admission: EM | Admit: 2021-02-02 | Discharge: 2021-02-02 | Disposition: A | Discharge status: Home / Self Care | Payer: PPO | Attending: Emergency Medicine | Admitting: Emergency Medicine

## 2021-02-02 DIAGNOSIS — J90 Pleural effusion, not elsewhere classified: Secondary | ICD-10-CM | POA: Diagnosis not present

## 2021-02-02 DIAGNOSIS — G8929 Other chronic pain: Secondary | ICD-10-CM | POA: Diagnosis not present

## 2021-02-02 DIAGNOSIS — R0789 Other chest pain: Secondary | ICD-10-CM | POA: Diagnosis present

## 2021-02-02 DIAGNOSIS — I10 Essential (primary) hypertension: Secondary | ICD-10-CM | POA: Diagnosis not present

## 2021-02-02 DIAGNOSIS — R000 Tachycardia, unspecified: Secondary | ICD-10-CM | POA: Insufficient documentation

## 2021-02-02 DIAGNOSIS — R072 Precordial pain: Secondary | ICD-10-CM | POA: Insufficient documentation

## 2021-02-02 DIAGNOSIS — J9811 Atelectasis: Secondary | ICD-10-CM | POA: Diagnosis not present

## 2021-02-02 DIAGNOSIS — R197 Diarrhea, unspecified: Secondary | ICD-10-CM | POA: Insufficient documentation

## 2021-02-02 DIAGNOSIS — R109 Unspecified abdominal pain: Secondary | ICD-10-CM | POA: Insufficient documentation

## 2021-02-02 DIAGNOSIS — R079 Chest pain, unspecified: Secondary | ICD-10-CM | POA: Diagnosis not present

## 2021-02-02 DIAGNOSIS — R0602 Shortness of breath: Secondary | ICD-10-CM | POA: Diagnosis not present

## 2021-02-02 DIAGNOSIS — I2699 Other pulmonary embolism without acute cor pulmonale: Secondary | ICD-10-CM | POA: Diagnosis not present

## 2021-02-02 DIAGNOSIS — I517 Cardiomegaly: Secondary | ICD-10-CM | POA: Diagnosis not present

## 2021-02-02 DIAGNOSIS — R1010 Upper abdominal pain, unspecified: Secondary | ICD-10-CM | POA: Diagnosis not present

## 2021-02-02 LAB — HEPATIC FUNCTION PANEL
ALT: 57 U/L — ABNORMAL HIGH (ref 0–44)
AST: 33 U/L (ref 15–41)
Albumin: 4 g/dL (ref 3.5–5.0)
Alkaline Phosphatase: 47 U/L (ref 38–126)
Bilirubin, Direct: 0.2 mg/dL (ref 0.0–0.2)
Indirect Bilirubin: 0.7 mg/dL (ref 0.3–0.9)
Total Bilirubin: 0.9 mg/dL (ref 0.3–1.2)
Total Protein: 6.9 g/dL (ref 6.5–8.1)

## 2021-02-02 LAB — CBC WITH DIFFERENTIAL/PLATELET
Abs Immature Granulocytes: 0.03 10*3/uL (ref 0.00–0.07)
Basophils Absolute: 0.1 10*3/uL (ref 0.0–0.1)
Basophils Relative: 1 %
Eosinophils Absolute: 0.4 10*3/uL (ref 0.0–0.5)
Eosinophils Relative: 5 %
HCT: 42.3 % (ref 36.0–46.0)
Hemoglobin: 14.3 g/dL (ref 12.0–15.0)
Immature Granulocytes: 0 %
Lymphocytes Relative: 21 %
Lymphs Abs: 1.9 10*3/uL (ref 0.7–4.0)
MCH: 33.3 pg (ref 26.0–34.0)
MCHC: 33.8 g/dL (ref 30.0–36.0)
MCV: 98.6 fL (ref 80.0–100.0)
Monocytes Absolute: 1 10*3/uL (ref 0.1–1.0)
Monocytes Relative: 11 %
Neutro Abs: 5.5 10*3/uL (ref 1.7–7.7)
Neutrophils Relative %: 62 %
Platelets: 229 10*3/uL (ref 150–400)
RBC: 4.29 MIL/uL (ref 3.87–5.11)
RDW: 13.5 % (ref 11.5–15.5)
WBC: 8.8 10*3/uL (ref 4.0–10.5)
nRBC: 0 % (ref 0.0–0.2)

## 2021-02-02 LAB — BASIC METABOLIC PANEL
Anion gap: 12 (ref 5–15)
BUN: 20 mg/dL (ref 8–23)
CO2: 24 mmol/L (ref 22–32)
Calcium: 9.5 mg/dL (ref 8.9–10.3)
Chloride: 104 mmol/L (ref 98–111)
Creatinine, Ser: 0.91 mg/dL (ref 0.44–1.00)
GFR, Estimated: >60 mL/min (ref >60)
Glucose, Bld: 108 mg/dL — ABNORMAL HIGH (ref 70–99)
Potassium: 4.2 mmol/L (ref 3.5–5.1)
Sodium: 140 mmol/L (ref 135–145)

## 2021-02-02 LAB — TROPONIN I (HIGH SENSITIVITY)
Troponin I (High Sensitivity): 5 ng/L (ref <18)
Troponin I (High Sensitivity): 12 ng/L (ref <18)
Troponin I (High Sensitivity): 15 ng/L (ref <18)

## 2021-02-02 LAB — D-DIMER, QUANTITATIVE: D-Dimer, Quant: 0.8 ug/mL-FEU — ABNORMAL HIGH (ref 0.00–0.50)

## 2021-02-02 LAB — LIPASE, BLOOD: Lipase: 39 U/L (ref 11–51)

## 2021-02-02 MED ORDER — IOHEXOL 350 MG/ML SOLN
100.0000 mL | Freq: Once | INTRAVENOUS | Status: AC
  Administered 2021-02-02: 100 mL via INTRAVENOUS

## 2021-02-02 MED ORDER — DOXYCYCLINE HYCLATE 100 MG PO CAPS: 100.0000 mg | ORAL_CAPSULE | Freq: Two times a day (BID) | ORAL | 0 refills | Status: DC

## 2021-02-02 MED ORDER — FUROSEMIDE 20 MG PO TABS: 20.0000 mg | ORAL_TABLET | Freq: Every day | ORAL | 0 refills | Status: DC

## 2021-02-02 NOTE — ED Notes (Signed)
Pt amb to [facility]

## 2021-02-02 NOTE — ED Triage Notes (Signed)
Pt c/o mid chest pain that radiates down into abdomen x 3-4 days, worse at night with shortness of breath. Pt reports nausea, denies sweating or swelling of feet and ankles.

## 2021-02-02 NOTE — ED Provider Notes (Signed)
Emergency Department Provider Note   I have reviewed the triage vital signs and the nursing notes.   HISTORY  Chief Complaint Chest Pain   HPI [name] [name] is a 75 y.o. female with past medical history reviewed below presents to the emergency department with chest discomfort over the past 3 to 4 days.  She describes a pressure in the chest which is constant.  She has some associated shortness of breath but denies pleuritic pain.  She has an associated pain in her abdomen but states her abdominal discomfort feels like her IBS symptoms.  Her son is at bedside and states she has been following with the gastroenterologist and has been diagnosed with IBS-D and is currently undergoing treatment.  Patient confirms that this abdominal discomfort is familiar to her and not suddenly new or different.  She denies any sharp/tearing pain in the chest.  No fevers or chills.  No radiation of symptoms or other modifying factors.  Past Medical History:  Diagnosis Date   Arthritis    Atypical chest pain    Diverticulosis    Gastritis 11/08/2007, 04/12/2012   H pylori bx neg 03/2012   GERD (gastroesophageal reflux disease) 11/08/2007, 04/12/2012   Hepatic cyst    Hiatal hernia 11/08/2007, 04/12/2012   History of kidney stones    Hyperlipidemia    Hypertension    IBS (irritable bowel syndrome)    diarrhea predom   Migraine    Osteoporosis 09/27/2010   DEXA 01/21/2012: -2.1 spine and R fem, -1.8 L fem s/p fosamax  2004-2011 DEXA 04/18/14 @ [facility]: -2.5 - rec to start Prolia     PVC's (premature ventricular contractions)    Schatzki's ring 11/11/2010   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   Shingles outbreak 04/2013    Patient Active Problem List   Diagnosis Date Noted   Left lower quadrant abdominal pain 05/01/2020   Acute post-traumatic headache, not intractable 11/15/2019   Prediabetes 05/02/2019   Carbuncle of groin 02/25/2019   Skin abnormalities 05/07/2018   Bilateral  shoulder pain 12/11/2017   Gastritis 12/11/2017   Trigger finger, right little finger 09/11/2017   Numbness and tingling in left hand 07/20/2017   Dupuytren's contracture of hand 07/20/2017   CTS (carpal tunnel syndrome) 07/17/2017   Bilateral carpal tunnel syndrome 07/17/2017   Arthralgia 06/30/2017   Leg cramps 06/10/2017   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   Chronic midline low back pain without sciatica 09/15/2016   Osteopenia 07/20/2016   Bilateral finger numbness 06/03/2016   Joint pain 06/03/2016   Atypical chest pain 02/09/2015   Hyperlipidemia 02/09/2015   Bilateral primary osteoarthritis of knee 10/03/2013   Lateral meniscal tear 10/03/2013   Patellofemoral pain syndrome 10/03/2013   Varicose vein of leg    Allergic rhinitis    IBS (irritable bowel syndrome) 04/07/2011   Epigastric pain 12/26/2010   Diarrhea 12/25/2010   Migraine headache 09/27/2010   Essential hypertension 09/27/2010   GERD 09/27/2010   RENAL CALCULUS, HX OF 09/27/2010    Past Surgical History:  Procedure Laterality Date   [place]  2008   ESOPHAGOGASTRODUODENOSCOPY     [place]    Allergies Patient has no known allergies.  Family History  Problem Relation Age of Onset   Hypertension Mother    Alzheimer's disease Mother 34   AAA (abdominal aortic aneurysm) Mother    Stroke Father 58  Kidney cancer Brother        mets   Bone cancer Brother 22   Colon cancer Neg Hx     Social History Social History   Tobacco Use   Smoking status: Never Smoker   Smokeless tobacco: Never Used  Scientific laboratory technician Use: Never used  Substance Use Topics   Alcohol use: No   Drug use: No    Review of Systems  Constitutional: No fever/chills Eyes: No visual changes. ENT: No sore throat. Cardiovascular: Positive chest pain. Respiratory: Positive shortness of  breath. Gastrointestinal: Positive abdominal pain.  No nausea, no vomiting. Positive diarrhea.  No constipation. Genitourinary: Negative for dysuria. Musculoskeletal: Negative for back pain. Skin: Negative for rash. Neurological: Negative for headaches, focal weakness or numbness.  10-point ROS otherwise negative.  ____________________________________________   PHYSICAL EXAM:  VITAL SIGNS: ED Triage Vitals  Enc Vitals Group     BP 02/02/21 0933 (!) 153/107     Pulse Rate 02/02/21 0933 (!) 105     Resp 02/02/21 0933 (!) 27     Temp 02/02/21 0933 98.1 F (36.7 C)     Temp Source 02/02/21 0933 Oral     SpO2 02/02/21 0933 96 %     Weight 02/02/21 0943 140 lb (63.5 kg)     Height 02/02/21 0943 5\' 3"  (1.6 m)    Constitutional: Alert and oriented. Well appearing and in no acute distress. Eyes: Conjunctivae are normal. Head: Atraumatic. Nose: No congestion/rhinnorhea. Mouth/Throat: Mucous membranes are moist.   Neck: No stridor.  Cardiovascular: Mild tachycardia. Good peripheral circulation. Grossly normal heart sounds.   Respiratory: Normal respiratory effort.  No retractions. Lungs CTAB. Gastrointestinal: Soft with mild diffuse tenderness in the abdomen but no peritoneal findings, rebound, guarding. no distention.  Musculoskeletal: No lower extremity tenderness nor edema. No gross deformities of extremities. Neurologic:  Normal speech and language. No gross focal neurologic deficits are appreciated.  Skin:  Skin is warm, dry and intact. No rash noted.  ____________________________________________   LABS (all labs ordered are listed, but only abnormal results are displayed)  Labs Reviewed  BASIC METABOLIC PANEL - Abnormal; Notable for the following components:      Result Value   Glucose, Bld 108 (*)    All other components within normal limits  HEPATIC FUNCTION PANEL - Abnormal; Notable for the following components:   ALT 57 (*)    All other components within normal  limits  D-DIMER, QUANTITATIVE - Abnormal; Notable for the following components:   D-Dimer, Quant 0.80 (*)    All other components within normal limits  CBC WITH DIFFERENTIAL/PLATELET  LIPASE, BLOOD  TROPONIN I (HIGH SENSITIVITY)  TROPONIN I (HIGH SENSITIVITY)  TROPONIN I (HIGH SENSITIVITY)   ____________________________________________  EKG   EKG Interpretation  Date/Time:  Saturday February 02 2021 10:02:07 EDT Ventricular Rate:  110 PR Interval:    QRS Duration: 91 QT Interval:  393 QTC Calculation: 454 R Axis:   70 Text Interpretation: Sinus tachycardia Multiform ventricular premature complexes No recent tracing for comparison Confirmed by Nanda Quinton [phone]) on 02/02/2021 10:17:01 AM Also confirmed by Nanda Quinton [phone]), editor [place], [place] [phone])  on 02/03/2021 9:58:06 AM       ____________________________________________  RADIOLOGY  CXR and CTA chest along with CT abdomen/pelvis.  ____________________________________________   PROCEDURES  Procedure(s) performed:   Procedures  None ____________________________________________   INITIAL IMPRESSION / ASSESSMENT AND PLAN / ED COURSE  Pertinent labs & imaging results that were available during  my care of the patient were reviewed by me and considered in my medical decision making (see chart for details).   Patient presents emergency department with pain in the chest over the past several days with some ongoing abdominal discomfort with diarrhea which is more chronic.  I considered aortic dissection but the patient is fairly clear that the pain in her chest is different from her more chronic pain in the abdomen.  She has had ongoing diarrhea which is also not new.  She is low risk for C. Diff.  Pain is not pleuritic but does have some mild tachycardia and complaining of shortness of breath and so I added on a D-dimer in addition to troponins.  Have also added on hepatic function and lipase panels.   Troponin  negative x 3. D-dimer elevated followed by CTA chest along with CT abdomen/pelvis. Patient has pleural effusions but no PE. No abdominal findings. Ambulated patient with no SOB or hypoxemia. She feels well overall. Discussed admit for diuresis and pleural effusion symptoms but with minimal symptoms and no O2 requirement patient would like to pursue treatment with PCP and return with any worsening symptoms. Lasix give x 4 days along with abx.   Patient and son at bedside are comfortable with plan at discharge. Discussed strict ED return precautions.  ____________________________________________  FINAL CLINICAL IMPRESSION(S) / ED DIAGNOSES  Final diagnoses:  Precordial chest pain  Pleural effusion  Chronic abdominal pain     MEDICATIONS GIVEN DURING THIS VISIT:  Medications  iohexol (OMNIPAQUE) 350 MG/ML injection 100 mL (100 mLs Intravenous Contrast Given 02/02/21 1228)     NEW OUTPATIENT MEDICATIONS STARTED DURING THIS VISIT:  Discharge Medication List as of 02/02/2021  3:17 PM    START taking these medications   Details  doxycycline (VIBRAMYCIN) 100 MG capsule Take 1 capsule (100 mg total) by mouth 2 (two) times daily for 7 days., Starting Sat 02/02/2021, Until Sat 02/09/2021, Normal    furosemide (LASIX) 20 MG tablet Take 1 tablet (20 mg total) by mouth daily for 4 days., Starting Sat 02/02/2021, Until Wed 02/06/2021, Normal        Note:  This document was prepared using Dragon voice recognition software and may include unintentional dictation errors.  Nanda Quinton, MD, [facility] Emergency Medicine    Dov Dill, Wonda Old[place], MD 02/03/21 [phone]

## 2021-02-02 NOTE — Discharge Instructions (Signed)
You were seen in the emergency department today with chest discomfort and trouble breathing.  On your CT scan of the chest we found some fluid around the lungs worse on the right side.  You are not requiring oxygen and are having severe symptoms at this time so we are trying some medications at home but it is important that you follow with your primary care doctor next week.  There are further outpatient tests that may be required and if the fluid does not get better sometimes it requires a procedure to drain the fluid.   If you develop fever, worsening pain, trouble breathing, or other sudden/severe symptoms you should return to the emergency department.  The Lasix medication is only for 4 days and will cause you to urinate frequently.  The antibiotic is for 7 days.

## 2021-02-02 NOTE — ED Notes (Signed)
Disregard Departure Condition at 1538.

## 2021-02-02 NOTE — ED Notes (Signed)
Attempted IV x 2 d/t pt sts current IV burns when flushed. No infiltration noted with current IV.

## 2021-02-02 NOTE — ED Notes (Signed)
O2 sat 93-95% while ambulating

## 2021-02-04 ENCOUNTER — Inpatient Hospital Stay: Payer: PPO | Admitting: Family

## 2021-02-04 NOTE — Telephone Encounter (Signed)
According to pt's mychart message, she needed to be soon early this week after f/u in ER.  Scheduled for Weds 3/16 with Dr Quay Burow.  Pt states she needs note for work to cover her absence until seen by PCP.

## 2021-02-05 ENCOUNTER — Encounter: Payer: Self-pay | Admitting: Internal Medicine

## 2021-02-05 ENCOUNTER — Telehealth: Payer: Self-pay | Admitting: Cardiovascular Disease

## 2021-02-05 DIAGNOSIS — J90 Pleural effusion, not elsewhere classified: Secondary | ICD-10-CM | POA: Insufficient documentation

## 2021-02-05 DIAGNOSIS — R079 Chest pain, unspecified: Secondary | ICD-10-CM | POA: Insufficient documentation

## 2021-02-05 DIAGNOSIS — I517 Cardiomegaly: Secondary | ICD-10-CM | POA: Insufficient documentation

## 2021-02-05 HISTORY — DX: Chest pain, unspecified: R07.9

## 2021-02-05 HISTORY — DX: Cardiomegaly: I51.7

## 2021-02-05 NOTE — Assessment & Plan Note (Addendum)
Acute Mod R, mild L Possibly related to CHF versus bowel disease Echocardiogram ordered for ASAP We will continue Lasix for now-20 mg daily Blood work, chest x-ray in 2 weeks before next visit

## 2021-02-05 NOTE — Telephone Encounter (Signed)
Pt c/o Shortness Of Breath: STAT if SOB developed within the last 24 hours or pt is noticeably SOB on the phone  1. Are you currently SOB (can you hear that pt is SOB on the phone)? No I was speaking with the patient husband and he said a little  2. How long have you been experiencing SOB? Patient went to the ER on Saturday.  3. Are you SOB when sitting or when up moving around? Moving around  4. Are you currently experiencing any other symptoms? No     Patient was told that she has a enlarge heart and fluid on her lungs.

## 2021-02-05 NOTE — Telephone Encounter (Signed)
Left message for the pt/husband to call back.   Pt was in ED 02/02/21 with Pleural Effusion CT showed cardiomegaly Pt seeing PCP 02/06/21.

## 2021-02-05 NOTE — Telephone Encounter (Signed)
Spoke with the pt and she is seeing her PCP tomorrow but was advised that she needs to see Dr. Gwenlyn Found as well... I reviewed his schedule with the pt and unable to find an opening until may 2022... I advised her that I will forward to his nurse and if any possibility or cancellations she can let her know if we can get her in sooner.  Pt concerned re: Cardiomegaly found on recent CT for when she was diagnose with Pleural Effusion being treated with Lasix and antibiotics.

## 2021-02-05 NOTE — Telephone Encounter (Signed)
[name] [name] is returning [name]'s call. Please advise.

## 2021-02-05 NOTE — Progress Notes (Signed)
Subjective:    Patient ID: [name] [name], female    DOB: [birth year], 75 y.o.   MRN: [id number]  HPI The patient is here for follow up from the hospital.   ED 02/02/21:  ED for chest pain x 3-4 days.  She had constant chest pressure w/ associated SOB.  She denied pleuritic chest pain or tearing sensation..  She had some abdominal pain that felt like her IBS symptoms.  No fever/chills.    D-dimer was minimally elevated.  Bmp, lfts, cbc,troponin normal. EKG sinus tach.  CXR - small b/l pleural effusions, possible developing infiltrate R base. Ct chest angio neg for PE, moderate R, small L pleural effusion w/ assoc atelectasis or consolidation.  Scattered ground glass airspace opacity throughout lungs, esp RUL, nonspecific and infectious or inflammatory.  Cardiomegaly.  Ct Ab. Pelvis - no acute Ct findings   She was discharged with lasix  20 mg qd x 4 days and doxycyline x 7 days   no change in urination.  Some improvement in SOB - maybe 40%.  No more chest pain.    She is still having diarrhea.   Medications and allergies reviewed with patient and updated if appropriate.  Patient Active Problem List   Diagnosis Date Noted  . Chest pain 02/05/2021  . Cardiomegaly 02/05/2021  . Pleural effusion, bilateral 02/05/2021  . Left lower quadrant abdominal pain 05/01/2020  . Acute post-traumatic headache, not intractable 11/15/2019  . Prediabetes 05/02/2019  . Carbuncle of groin 02/25/2019  . Skin abnormalities 05/07/2018  . Bilateral shoulder pain 12/11/2017  . Gastritis 12/11/2017  . Trigger finger, right little finger 09/11/2017  . Numbness and tingling in left hand 07/20/2017  . Dupuytren's contracture of hand 07/20/2017  . CTS (carpal tunnel syndrome) 07/17/2017  . Bilateral carpal tunnel syndrome 07/17/2017  . Arthralgia 06/30/2017  . Leg cramps 06/10/2017  . 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 Chronic midline low back pain without sciatica 09/15/2016  .  Osteopenia 07/20/2016  . Bilateral finger numbness 06/03/2016  . Joint pain 06/03/2016  . Hyperlipidemia 02/09/2015  . Bilateral primary osteoarthritis of knee 10/03/2013  . Lateral meniscal tear 10/03/2013  . 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 Varicose vein of leg   . Allergic rhinitis   . IBS (irritable bowel syndrome) 04/07/2011  . Epigastric pain 12/26/2010  . Diarrhea 12/25/2010  . Migraine headache 09/27/2010  . Essential hypertension 09/27/2010  . GERD 09/27/2010  . RENAL CALCULUS, HX OF 09/27/2010    Current Outpatient Medications on File Prior to Visit  Medication Sig Dispense Refill  . atorvastatin (LIPITOR) 40 MG tablet Take 1/2 (one-half) tablet by mouth once daily 45 tablet 0  . butalbital-acetaminophen-caffeine (FIORICET) 50-325-40 MG tablet TAKE 1 TABLET BY MOUTH EVERY 4 HOURS AS NEEDED FOR HEADACHE 30 tablet 0  . calcium carbonate (OS-CAL) 600 MG tablet Take 1 tablet (600 mg total) by mouth daily. 60 tablet   . Cholecalciferol (VITAMIN D3) 1000 units CAPS Take 1,000 Units by mouth 3 (three) times a week.     . cyanocobalamin 1000 MCG tablet Take 1,000 mcg by mouth daily.    Marland Kitchen dicyclomine (BENTYL) 10 MG capsule Take 1 capsule (10 mg total) by mouth every 8 (eight) hours as needed for spasms. 90 capsule 1  . gabapentin (NEURONTIN) 100 MG capsule Take  2 CAPSULES AT BEDTIME. 60 capsule 4  . loperamide (IMODIUM A-D) 2 MG tablet Take 1/2 tablet every morning and titrate as needed 30 tablet  0  . losartan (COZAAR) 100 MG tablet Take 1 tablet by mouth once daily 90 tablet 0  . metoprolol succinate (TOPROL-XL) 50 MG 24 hr tablet Take 1 tablet by mouth once daily 90 tablet 0  . pantoprazole (PROTONIX) 40 MG tablet Take 1 tablet (40 mg total) by mouth daily. 180 tablet 0  . Peppermint Oil (IBGARD) 90 MG CPCR Take as directed 16 capsule 0  . furosemide (LASIX) 20 MG tablet Take 1 tablet (20 mg total) by mouth daily for 4 days. (Patient not taking: Reported on 02/06/2021) 4  tablet 0   No current facility-administered medications on file prior to visit.    Past Medical History:  Diagnosis Date  . Arthritis   . Atypical chest pain   . Diverticulosis   . Gastritis 11/08/2007, 04/12/2012   H pylori bx neg 03/2012  . GERD (gastroesophageal reflux disease) 11/08/2007, 04/12/2012  . Hepatic cyst   . Hiatal hernia 11/08/2007, 04/12/2012  . History of kidney stones   . Hyperlipidemia   . Hypertension   . IBS (irritable bowel syndrome)    diarrhea predom  . Migraine   . Osteoporosis 09/27/2010   DEXA 01/21/2012: -2.1 spine and R fem, -1.8 L fem s/p fosamax  2004-2011 DEXA 04/18/14 @ [facility]: -2.5 - rec to start Prolia    . PVC's (premature ventricular contractions)   . 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 Shingles outbreak 04/2013    Past Surgical History:  Procedure Laterality Date  . APPENDECTOMY  1958  . COLONOSCOPY    . CYSTOCELE REPAIR  2008  . ESOPHAGOGASTRODUODENOSCOPY    . Maxilofacial  1992  . TONSILLECTOMY  1960    Social History   Socioeconomic History  . Marital status: Married    Spouse name: [name]  . Number of children: 3  . Years of education: Not on file  . Highest education level: Master's degree (e.g., MA, MS, MEng, MEd, MSW, MBA)  Occupational History  . Occupation: retired  Tobacco Use  . Smoking status: Never Smoker  . Smokeless tobacco: Never Used  Vaping Use  . Vaping Use: Never used  Substance and Sexual Activity  . Alcohol use: No  . Drug use: No  . Sexual activity: Not on file  Other Topics Concern  . Not on file  Social History Narrative   Married, lives with spouse. Retired Licensed conveyancer, Print production planner. Has 3 kids (moved to [place] to be closer)- youngest son MD. Dorie Rank to [place] from [place] 06/2010, lived in [place] x 10years   Social Determinants of Health   Financial Resource Strain: Leon   . Difficulty of Paying Living Expenses: Not hard at all  Food Insecurity: Not on file   Transportation Needs: Not on file  Physical Activity: Not on file  Stress: Not on file  Social Connections: Not on file    Family History  Problem Relation Age of Onset  . Hypertension Mother   . Alzheimer's disease Mother 60  . AAA (abdominal aortic aneurysm) Mother   . Stroke Father 47  . Kidney cancer Brother        mets  . Bone cancer Brother 108  . Colon cancer Neg Hx     Review of Systems  Constitutional: Negative for chills and fever.  Respiratory: Positive for cough and shortness of breath (improved). Negative for wheezing.   Cardiovascular: Positive for palpitations (sometimes -with activity). Negative for chest pain and leg swelling.  Gastrointestinal: Positive for  abdominal pain (lower abd cramping) and diarrhea.  Neurological: Positive for headaches. Negative for dizziness and light-headedness.       Objective:   Vitals:   02/06/21 0856  BP: 100/62  Pulse: 75  Temp: 98.2 F (36.8 C)  SpO2: 97%   BP Readings from Last 3 Encounters:  02/06/21 100/62  02/02/21 (!) 144/73  12/24/20 104/60   Wt Readings from Last 3 Encounters:  02/06/21 132 lb (59.9 kg)  02/02/21 140 lb (63.5 kg)  12/24/20 140 lb (63.5 kg)   Body mass index is 23.38 kg/m.   Physical Exam    Constitutional: Appears well-developed and well-nourished. No distress.  HENT:  Head: Normocephalic and atraumatic.  Neck: Neck supple. No tracheal deviation present. No thyromegaly present.  No cervical lymphadenopathy Cardiovascular: Normal rate, regular rhythm and normal heart sounds.   3/6 sys murmur heard. No carotid bruit .  No edema Pulmonary/Chest: Effort normal and breath sounds normal. No respiratory distress. No has no wheezes. No rales.  Skin: Skin is warm and dry. Not diaphoretic.  Psychiatric: Normal mood and affect. Behavior is normal.    CT ABDOMEN PELVIS W CONTRAST CLINICAL DATA:  Abdominal pain  EXAM: CT ABDOMEN AND PELVIS WITH CONTRAST  TECHNIQUE: Multidetector CT  imaging of the abdomen and pelvis was performed using the standard protocol following bolus administration of intravenous contrast.  CONTRAST:  146mL OMNIPAQUE IOHEXOL 350 MG/ML SOLN  COMPARISON:  05/02/2020  FINDINGS: Lower chest: Moderate right, small left pleural effusions and associated atelectasis or consolidation.  Hepatobiliary: No solid liver abnormality is seen. Multiple low-attenuation cysts or hemangiomata of the liver. No gallstones, gallbladder wall thickening, or biliary dilatation.  Pancreas: Unremarkable. No pancreatic ductal dilatation or surrounding inflammatory changes.  Spleen: Normal in size without significant abnormality.  Adrenals/Urinary Tract: Adrenal glands are unremarkable. Benign left renal cysts. Kidneys are otherwise normal, without renal calculi, solid lesion, or hydronephrosis. Bladder is unremarkable.  Stomach/Bowel: Stomach is within normal limits. Appendix is not clearly visualized and may be surgically absent. No evidence of bowel wall thickening, distention, or inflammatory changes.  Vascular/Lymphatic: Aortic atherosclerosis. No enlarged abdominal or pelvic lymph nodes.  Reproductive: No mass or other significant abnormality.  Other: No abdominal wall hernia or abnormality. No abdominopelvic ascites.  Musculoskeletal: No acute or significant osseous findings.  IMPRESSION: 1. No acute CT findings of the abdomen or pelvis to explain abdominal pain. 2. Moderate right, small left pleural effusions and associated atelectasis or consolidation, separately reported on examination of the chest. 3. Aortic atherosclerosis.  Aortic Atherosclerosis (ICD10-I70.0).  Electronically Signed   By: Eddie Candle M.D.   On: 02/02/2021 12:54 CT Angio Chest PE W and/or Wo Contrast CLINICAL DATA:  PE suspected, on exam  EXAM: CT ANGIOGRAPHY CHEST WITH CONTRAST  TECHNIQUE: Multidetector CT imaging of the chest was performed using the standard  protocol during bolus administration of intravenous contrast. Multiplanar CT image reconstructions and MIPs were obtained to evaluate the vascular anatomy.  CONTRAST:  124mL OMNIPAQUE IOHEXOL 350 MG/ML SOLN  COMPARISON:  None.  FINDINGS: Cardiovascular: Satisfactory opacification of the pulmonary arteries to the segmental level. No evidence of pulmonary embolism. Cardiomegaly. No pericardial effusion.  Mediastinum/Nodes: No enlarged mediastinal, hilar, or axillary lymph nodes. Thyroid gland, trachea, and esophagus demonstrate no significant findings.  Lungs/Pleura: Moderate right, small left pleural effusion associated atelectasis or consolidation. Scattered ground-glass airspace opacity throughout the lungs, particularly in the right upper lobe series 4, image 24).  Upper Abdomen: No acute abnormality.  Musculoskeletal: No  chest wall abnormality. No acute or significant osseous findings.  Review of the MIP images confirms the above findings.  IMPRESSION: 1. Negative examination for pulmonary embolism. 2. Moderate right, small left pleural effusions with associated atelectasis or consolidation. 3. Scattered ground-glass airspace opacity throughout the lungs, particularly in the right upper lobe, nonspecific and infectious or inflammatory. 4. Cardiomegaly.  Electronically Signed   By: Eddie Candle M.D.   On: 02/02/2021 12:49 DG Chest 2 View CLINICAL DATA:  Chest pain shortness of breath.  EXAM: CHEST - 2 VIEW  COMPARISON:  January 25, 2015  FINDINGS: No pneumothorax. The heart, hila, mediastinum are normal. No pulmonary nodules or masses. Streaky opacities in the bases with probable tiny effusions. No other acute abnormalities.  IMPRESSION: Findings are favored to represent small bilateral effusions with underlying atelectasis. Developing infiltrate in the right base is considered less likely. Recommend short-term follow-up to ensure resolution.  Electronically  Signed   By: Dorise Bullion III M.D   On: 02/02/2021 10:45   Lab Results  Component Value Date   WBC 8.8 02/02/2021   HGB 14.3 02/02/2021   HCT 42.3 02/02/2021   PLT 229 02/02/2021   GLUCOSE 108 (H) 02/02/2021   CHOL 144 11/02/2019   TRIG 299.0 (H) 11/02/2019   HDL 43.80 11/02/2019   LDLDIRECT 75.0 11/02/2019   LDLCALC 82 05/02/2019   ALT 57 (H) 02/02/2021   AST 33 02/02/2021   NA 140 02/02/2021   K 4.2 02/02/2021   CL 104 02/02/2021   CREATININE 0.91 02/02/2021   BUN 20 02/02/2021   CO2 24 02/02/2021   TSH 0.72 02/20/2020   HGBA1C 5.6 11/02/2019      Assessment & Plan:    See Problem List for Assessment and Plan of chronic medical problems.    This visit occurred during the SARS-CoV-2 public health emergency.  Safety protocols were in place, including screening questions prior to the visit, additional usage of staff PPE, and extensive cleaning of exam room while observing appropriate contact time as indicated for disinfecting solutions.

## 2021-02-05 NOTE — Patient Instructions (Addendum)
°  Blood work was ordered and a chest xray - to be done 2 days prior to your appointment.    Medications changes include :   Lasix 20 mg daily  Your prescription(s) have been submitted to your pharmacy. Please take as directed and contact our office if you believe you are having problem(s) with the medication(s).   An Echocardiogram was ordered and referral for pulmonary.  Someone from their office will call you to schedule an appointment.

## 2021-02-06 ENCOUNTER — Other Ambulatory Visit: Payer: Self-pay

## 2021-02-06 ENCOUNTER — Ambulatory Visit (INDEPENDENT_AMBULATORY_CARE_PROVIDER_SITE_OTHER): Payer: PPO | Admitting: Internal Medicine

## 2021-02-06 VITALS — BP 100/62 | HR 75 | Temp 98.2°F | Ht 63.0 in | Wt 132.0 lb

## 2021-02-06 DIAGNOSIS — R0789 Other chest pain: Secondary | ICD-10-CM | POA: Diagnosis not present

## 2021-02-06 DIAGNOSIS — R918 Other nonspecific abnormal finding of lung field: Secondary | ICD-10-CM

## 2021-02-06 DIAGNOSIS — I10 Essential (primary) hypertension: Secondary | ICD-10-CM | POA: Diagnosis not present

## 2021-02-06 DIAGNOSIS — K580 Irritable bowel syndrome with diarrhea: Secondary | ICD-10-CM

## 2021-02-06 DIAGNOSIS — E782 Mixed hyperlipidemia: Secondary | ICD-10-CM

## 2021-02-06 DIAGNOSIS — R7303 Prediabetes: Secondary | ICD-10-CM

## 2021-02-06 DIAGNOSIS — J90 Pleural effusion, not elsewhere classified: Secondary | ICD-10-CM

## 2021-02-06 DIAGNOSIS — I517 Cardiomegaly: Secondary | ICD-10-CM

## 2021-02-06 HISTORY — DX: Other nonspecific abnormal finding of lung field: R91.8

## 2021-02-06 MED ORDER — LOPERAMIDE HCL 2 MG PO TABS: ORAL_TABLET | ORAL | 0 refills | Status: DC

## 2021-02-06 MED ORDER — FUROSEMIDE 20 MG PO TABS: 20.0000 mg | ORAL_TABLET | Freq: Every day | ORAL | 0 refills | Status: DC

## 2021-02-06 NOTE — Assessment & Plan Note (Signed)
Chronic Blood pressure on low side today, but controlled May need to make some adjustments soon depending on further testing done Continue metoprolol XL 50 mg daily and losartan 100 mg daily Continue Lasix 20 mg daily for now

## 2021-02-06 NOTE — Assessment & Plan Note (Signed)
New Seen on imaging Echocardiogram ordered for further evaluation

## 2021-02-06 NOTE — Assessment & Plan Note (Signed)
Groundglass opacities throughout lung ?  Atypical infection on imaging-started on doxycycline, which she will complete Referral ordered for pulmonary

## 2021-02-06 NOTE — Assessment & Plan Note (Signed)
Chronic Having diarrhea again - full work up in past negative Advised to start imodium 1/2 tab daily by Dr Havery Moros but is not taking it-advised to start taking half of an Imodium daily and can increase this to 1 full pill if needed, but to be careful because we do not want him to cause constipation

## 2021-02-06 NOTE — Assessment & Plan Note (Signed)
New problem Resolved-evaluation in the ED ruled out ACS Likely tied in with other symptoms Further evaluation with echocardiogram We will see cardiology

## 2021-02-13 ENCOUNTER — Encounter: Payer: Self-pay | Admitting: Internal Medicine

## 2021-02-19 ENCOUNTER — Ambulatory Visit: Payer: PPO | Admitting: Gastroenterology

## 2021-02-19 ENCOUNTER — Other Ambulatory Visit (INDEPENDENT_AMBULATORY_CARE_PROVIDER_SITE_OTHER): Payer: PPO

## 2021-02-19 ENCOUNTER — Other Ambulatory Visit: Payer: Self-pay

## 2021-02-19 VITALS — BP 118/68 | HR 80 | Wt 134.0 lb

## 2021-02-19 DIAGNOSIS — K580 Irritable bowel syndrome with diarrhea: Secondary | ICD-10-CM

## 2021-02-19 DIAGNOSIS — J90 Pleural effusion, not elsewhere classified: Secondary | ICD-10-CM

## 2021-02-19 DIAGNOSIS — K219 Gastro-esophageal reflux disease without esophagitis: Secondary | ICD-10-CM

## 2021-02-19 DIAGNOSIS — R0789 Other chest pain: Secondary | ICD-10-CM

## 2021-02-19 DIAGNOSIS — R7303 Prediabetes: Secondary | ICD-10-CM | POA: Diagnosis not present

## 2021-02-19 DIAGNOSIS — I10 Essential (primary) hypertension: Secondary | ICD-10-CM

## 2021-02-19 LAB — LIPID PANEL
Cholesterol: 176 mg/dL (ref 0–200)
HDL: 49.5 mg/dL (ref >39.00)
LDL Cholesterol: 96 mg/dL (ref 0–99)
NonHDL: 126.48
Total CHOL/HDL Ratio: 4
Triglycerides: 153 mg/dL — ABNORMAL HIGH (ref 0.0–149.0)
VLDL: 30.6 mg/dL (ref 0.0–40.0)

## 2021-02-19 LAB — COMPREHENSIVE METABOLIC PANEL
ALT: 28 U/L (ref 0–35)
AST: 20 U/L (ref 0–37)
Albumin: 4.1 g/dL (ref 3.5–5.2)
Alkaline Phosphatase: 38 U/L — ABNORMAL LOW (ref 39–117)
BUN: 22 mg/dL (ref 6–23)
CO2: 27 mEq/L (ref 19–32)
Calcium: 9.3 mg/dL (ref 8.4–10.5)
Chloride: 103 mEq/L (ref 96–112)
Creatinine, Ser: 0.92 mg/dL (ref 0.40–1.20)
GFR: 60.82 mL/min (ref >60.00)
Glucose, Bld: 97 mg/dL (ref 70–99)
Potassium: 3.6 mEq/L (ref 3.5–5.1)
Sodium: 140 mEq/L (ref 135–145)
Total Bilirubin: 0.9 mg/dL (ref 0.2–1.2)
Total Protein: 6.9 g/dL (ref 6.0–8.3)

## 2021-02-19 LAB — CBC WITH DIFFERENTIAL/PLATELET
Basophils Absolute: 0.1 10*3/uL (ref 0.0–0.1)
Basophils Relative: 0.9 % (ref 0.0–3.0)
Eosinophils Absolute: 0.4 10*3/uL (ref 0.0–0.7)
Eosinophils Relative: 6.9 % — ABNORMAL HIGH (ref 0.0–5.0)
HCT: 40.5 % (ref 36.0–46.0)
Hemoglobin: 13.8 g/dL (ref 12.0–15.0)
Lymphocytes Relative: 27.6 % (ref 12.0–46.0)
Lymphs Abs: 1.8 10*3/uL (ref 0.7–4.0)
MCHC: 34 g/dL (ref 30.0–36.0)
MCV: 98.7 fl (ref 78.0–100.0)
Monocytes Absolute: 0.7 10*3/uL (ref 0.1–1.0)
Monocytes Relative: 10.8 % (ref 3.0–12.0)
Neutro Abs: 3.4 10*3/uL (ref 1.4–7.7)
Neutrophils Relative %: 53.8 % (ref 43.0–77.0)
Platelets: 197 10*3/uL (ref 150.0–400.0)
RBC: 4.11 Mil/uL (ref 3.87–5.11)
RDW: 13.8 % (ref 11.5–15.5)
WBC: 6.4 10*3/uL (ref 4.0–10.5)

## 2021-02-19 LAB — TSH: TSH: 0.92 u[IU]/mL (ref 0.35–4.50)

## 2021-02-19 LAB — HEMOGLOBIN A1C: Hgb A1c MFr Bld: 5.6 % (ref 4.6–6.5)

## 2021-02-19 LAB — BRAIN NATRIURETIC PEPTIDE: Pro B Natriuretic peptide (BNP): 222 pg/mL — ABNORMAL HIGH (ref 0.0–100.0)

## 2021-02-19 NOTE — Progress Notes (Signed)
HPI :  75 year old female here for history of GERD, and IBS, here to reassess for these issues.  Recall that I saw her in September 2021 for worsening bowel problems as well as longstanding reflux and we decided to pursue EGD and colonoscopy as outlined below.  In short she had a small hiatal hernia, no significant pathology in the upper GI tract, biopsies unremarkable.  Her colon was normal as was her ileum.  Biopsies of the colon showed no evidence of microscopic colitis. She had a negative GI pathogen panel. I had suspected she probably had some underlying IBS that was made worse after she was visiting [place] earlier in the year.  It sounds like she had a severe infectious diarrheal illness there and had not recovered from it clinically.  We had discussed some options at her last visit.  We discussed the low FODMAP diet which she tried.  We had also recommended using IBgard and Bentyl as needed for her abdominal cramps, and using Imodium as needed to control the loose stool.  Her reflux had previously responded to higher dose Protonix at twice daily dosing of 40 mg, we reduce that to 40 mg once daily to see how she did.  In general she is doing much better since of last seen her.  She states her bowels are significantly improved and she is no longer having loose stools.  She has stopped the Imodium.  She is no longer taking Bentyl scheduled.  She thinks IBgard has provided her significant benefit so far, she was using it routinely and now has decrease the dose to 1 tablet a day and states he continues to work well and controls her symptoms.  She has otherwise tapered off Protonix completely, trying to simplify her medication regimen and states she continues to do well without any reflux symptoms that are bothering her now.  She was seen in the ER 2 weeks ago with chest discomfort and shortness of breath.  Found to have a pleural effusion.  She is awaiting to see pulmonary for their opinion and has  an echocardiogram scheduled.  She was given some Lasix and states she thinks this is helped and her breathing is a bit better.  In general she seems quite content with how she is been doing over the past 2 weeks.   Endoscopic history: EGD 04/12/2012 - 3cm HH, otherwise normal, clo test negative Colonoscopy 04/12/2012 - diverticulosis, no polyps EGD 10/2008 - mild Shatski ring dilated to 54Fr Venia Minks, small HH  CT scan 01/05/2018 - multiple hepatic cysts, mild diffuse hepatic steatosis, normal GB, normal pancreas. "Mild enhancement" of left colon, nonspecific  HIDA 01/01/2017 - normal [place] RUQ 12/26/2016 - no gallstones  CT scan 05/03/20 -IMPRESSION: 1. No diverticulitis. 2. No explanation for abdominal pain. 3. Small hiatal hernia. 4. Benign-appearing cysts of the liver and kidneys.  CBC and LFTs normal  GI pathogen panel 07/26/20 negative  Colonoscopy 08/06/20 -  - The terminal ileum appeared normal. - A localized area of mucosa was found in the distal rectum / dentate line consistent with focal prolapse change. Biopsies were taken with a cold forceps for histology to ensure no adenomatous changes. - Internal hemorrhoids were found during retroflexion. - The exam was otherwise without abnormality. 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.  EGD 08/06/20  - A 2 cm hiatal hernia was present. -  The exam of the esophagus was otherwise normal. - Multiple small benign appearing sessile polyps were found in the gastric fundus and in the gastric body. Biopsies were taken with a cold forceps for histology and a few of them were removed as representative sample/ - Diffuse atrophic mucosa was found in the entire examined stomach. Biopsies were taken from antrum / body with a cold forceps for Helicobacter pylori testing and to rule out GIM. - The exam of the  stomach was otherwise normal. - The duodenal bulb and second portion of the duodenum were normal. Biopsies for histology were taken with a cold forceps for evaluation of celiac disease.  1. Surgical [P], duodenal bulb, 2nd portion of duodenum and distal duodenum - DUODENAL MUCOSA WITH NO SIGNIFICANT PATHOLOGIC FINDINGS. - NEGATIVE FOR INCREASED INTRAEPITHELIAL LYMPHOCYTES AND VILLOUS ARCHITECTURAL CHANGES. 2. Surgical [P], gastric antrum and gastric body - GASTRIC ANTRAL MUCOSA WITH MILD REACTIVE GASTROPATHY. 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 - WARTHIN-STARRY STAIN IS NEGATIVE FOR HELICOBACTER PYLORI. 3. Surgical [P], gastric polyps - FUNDIC GLAND POLYPS. 4. Surgical [P], random colon bx - COLONIC MUCOSA WITH NO SIGNIFICANT PATHOLOGIC FINDINGS. - NEGATIVE FOR ACTIVE INFLAMMATION AND OTHER ABNORMALITIES. 5. Surgical [P], colon, rectum - ULCER. - NEGATIVE FOR DYSPLASIA.  CT scan 02/02/21 - Abdomen / pelvis with contrast - IMPRESSION: 1. No acute CT findings of the abdomen or pelvis to explain abdominal pain. 2. Moderate right, small left pleural effusions and associated atelectasis or consolidation, separately reported on examination of the chest. 3. Aortic atherosclerosis.    Past Medical History:  Diagnosis Date  . Arthritis   . Atypical chest pain   . Diverticulosis   . Gastritis 11/08/2007, 04/12/2012   H pylori bx neg 03/2012  . GERD (gastroesophageal reflux disease) 11/08/2007, 04/12/2012  . Hepatic cyst   . Hiatal hernia 11/08/2007, 04/12/2012  . History of kidney stones   . Hyperlipidemia   . Hypertension   . IBS (irritable bowel syndrome)    diarrhea predom  . Migraine   . Osteoporosis 09/27/2010   DEXA 01/21/2012: -2.1 spine and R fem, -1.8 L fem s/p fosamax  2004-2011 DEXA 04/18/14 @ [facility]: -2.5 - rec to start Prolia    . PVC's (premature ventricular contractions)   . 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 Shingles  outbreak 04/2013     Past Surgical History:  Procedure Laterality Date  . APPENDECTOMY  1958  . COLONOSCOPY    . CYSTOCELE REPAIR  2008  . ESOPHAGOGASTRODUODENOSCOPY    . Maxilofacial  1992  . TONSILLECTOMY  1960   Family History  Problem Relation Age of Onset  . Hypertension Mother   . Alzheimer's disease Mother 87  . AAA (abdominal aortic aneurysm) Mother   . Stroke Father 75  . Kidney cancer Brother        mets  . Bone cancer Brother 25  . Colon cancer Neg Hx    Social History   Tobacco Use  . Smoking status: Never Smoker  . Smokeless tobacco: Never Used  Vaping Use  . Vaping Use: Never used  Substance Use Topics  . Alcohol use: No  . Drug use: No   Current Outpatient Medications  Medication Sig Dispense Refill  . atorvastatin (LIPITOR) 40 MG tablet Take 1/2 (one-half) tablet by mouth once daily 45 tablet 0  . butalbital-acetaminophen-caffeine (FIORICET) 50-325-40 MG tablet TAKE 1 TABLET BY MOUTH EVERY 4 HOURS AS NEEDED FOR HEADACHE 30 tablet 0  .  calcium carbonate (OS-CAL) 600 MG tablet Take 1 tablet (600 mg total) by mouth daily. 60 tablet   . Cholecalciferol (VITAMIN D3) 1000 units CAPS Take 1,000 Units by mouth 3 (three) times a week.     . cyanocobalamin 1000 MCG tablet Take 1,000 mcg by mouth daily.    Marland Kitchen dicyclomine (BENTYL) 10 MG capsule Take 1 capsule (10 mg total) by mouth every 8 (eight) hours as needed for spasms. 90 capsule 1  . furosemide (LASIX) 20 MG tablet Take 1 tablet (20 mg total) by mouth daily for 4 days. 30 tablet 0  . gabapentin (NEURONTIN) 100 MG capsule Take  2 CAPSULES AT BEDTIME. 60 capsule 4  . loperamide (IMODIUM A-D) 2 MG tablet Take 1/2 tablet every morning and titrate as needed 30 tablet 0  . losartan (COZAAR) 100 MG tablet Take 1 tablet by mouth once daily 90 tablet 0  . metoprolol succinate (TOPROL-XL) 50 MG 24 hr tablet Take 1 tablet by mouth once daily 90 tablet 0  . pantoprazole (PROTONIX) 40 MG tablet Take 1 tablet (40 mg total)  by mouth daily. 180 tablet 0  . Peppermint Oil (IBGARD) 90 MG CPCR Take as directed 16 capsule 0   No current facility-administered medications for this visit.   No Known Allergies   Review of Systems: All systems reviewed and negative except where noted in HPI.    DG Chest 2 View  Result Date: 02/02/2021 CLINICAL DATA:  Chest pain shortness of breath. EXAM: CHEST - 2 VIEW COMPARISON:  January 25, 2015 FINDINGS: No pneumothorax. The heart, hila, mediastinum are normal. No pulmonary nodules or masses. Streaky opacities in the bases with probable tiny effusions. No other acute abnormalities. IMPRESSION: Findings are favored to represent small bilateral effusions with underlying atelectasis. Developing infiltrate in the right base is considered less likely. Recommend short-term follow-up to ensure resolution. Electronically Signed   By: Dorise Bullion III M.D   On: 02/02/2021 10:45   CT Angio Chest PE W and/or Wo Contrast  Result Date: 02/02/2021 CLINICAL DATA:  PE suspected, on exam EXAM: CT ANGIOGRAPHY CHEST WITH CONTRAST TECHNIQUE: Multidetector CT imaging of the chest was performed using the standard protocol during bolus administration of intravenous contrast. Multiplanar CT image reconstructions and MIPs were obtained to evaluate the vascular anatomy. CONTRAST:  149mL OMNIPAQUE IOHEXOL 350 MG/ML SOLN COMPARISON:  None. FINDINGS: Cardiovascular: Satisfactory opacification of the pulmonary arteries to the segmental level. No evidence of pulmonary embolism. Cardiomegaly. No pericardial effusion. Mediastinum/Nodes: No enlarged mediastinal, hilar, or axillary lymph nodes. Thyroid gland, trachea, and esophagus demonstrate no significant findings. Lungs/Pleura: Moderate right, small left pleural effusion associated atelectasis or consolidation. Scattered ground-glass airspace opacity throughout the lungs, particularly in the right upper lobe series 4, image 24). Upper Abdomen: No acute abnormality.  Musculoskeletal: No chest wall abnormality. No acute or significant osseous findings. Review of the MIP images confirms the above findings. IMPRESSION: 1. Negative examination for pulmonary embolism. 2. Moderate right, small left pleural effusions with associated atelectasis or consolidation. 3. Scattered ground-glass airspace opacity throughout the lungs, particularly in the right upper lobe, nonspecific and infectious or inflammatory. 4. Cardiomegaly. Electronically Signed   By: Eddie Candle M.D.   On: 02/02/2021 12:49   CT ABDOMEN PELVIS W CONTRAST  Result Date: 02/02/2021 CLINICAL DATA:  Abdominal pain EXAM: CT ABDOMEN AND PELVIS WITH CONTRAST TECHNIQUE: Multidetector CT imaging of the abdomen and pelvis was performed using the standard protocol following bolus administration of intravenous contrast. CONTRAST:  167mL  OMNIPAQUE IOHEXOL 350 MG/ML SOLN COMPARISON:  05/02/2020 FINDINGS: Lower chest: Moderate right, small left pleural effusions and associated atelectasis or consolidation. Hepatobiliary: No solid liver abnormality is seen. Multiple low-attenuation cysts or hemangiomata of the liver. No gallstones, gallbladder wall thickening, or biliary dilatation. Pancreas: Unremarkable. No pancreatic ductal dilatation or surrounding inflammatory changes. Spleen: Normal in size without significant abnormality. Adrenals/Urinary Tract: Adrenal glands are unremarkable. Benign left renal cysts. Kidneys are otherwise normal, without renal calculi, solid lesion, or hydronephrosis. Bladder is unremarkable. Stomach/Bowel: Stomach is within normal limits. Appendix is not clearly visualized and may be surgically absent. No evidence of bowel wall thickening, distention, or inflammatory changes. Vascular/Lymphatic: Aortic atherosclerosis. No enlarged abdominal or pelvic lymph nodes. Reproductive: No mass or other significant abnormality. Other: No abdominal wall hernia or abnormality. No abdominopelvic ascites.  Musculoskeletal: No acute or significant osseous findings. IMPRESSION: 1. No acute CT findings of the abdomen or pelvis to explain abdominal pain. 2. Moderate right, small left pleural effusions and associated atelectasis or consolidation, separately reported on examination of the chest. 3. Aortic atherosclerosis. Aortic Atherosclerosis (ICD10-I70.0). Electronically Signed   By: Eddie Candle M.D.   On: 02/02/2021 12:54    Physical Exam: BP 118/68   Pulse 80   Wt 134 lb (60.8 kg)   BMI 23.74 kg/m  Constitutional: Pleasant,well-developed, female in no acute distress. Neurological: Alert and oriented to person place and time. Psychiatric: Normal mood and affect. Behavior is normal.   ASSESSMENT AND PLAN: 75 year old female here for reassessment of the following:  Postinfectious IBS GERD Pleural effusion  In regards to her bowel symptoms, doing much better since have last seen her.  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  Her diarrhea has now resolved, no longer needing to use Imodium.  Her abdominal cramping has significantly improved.  No longer taking Bentyl and using only IBgard as needed.  She has likewise tapered off Protonix completely and now not requiring anything for this and doing well.  Endoscopic evaluation reassuring.  Moving forward, she can use these supportive measures again as needed if she has recurring symptoms.  I provided her some coupons for IBgard today, that seemed to help her more than anything. She thinks her breathing is better on the Lasix, she is awaiting an echocardiogram and further evaluation by pulmonary for her pleural effusion.  Of note she had an elevated liver enzyme when in the ED, ALT mildly elevated, she has had repeat blood work today and that is pending with her primary care.  Patient can follow-up with me as needed for these issues moving forward.  [place] Cellar, MD [facility]

## 2021-02-19 NOTE — Patient Instructions (Addendum)
If you are age 76 or older, your body mass index should be between 23-30. Your Body mass index is 23.74 kg/m. If this is out of the aforementioned range listed, please consider follow up with your Primary Care Provider.  If you are age 73 or younger, your body mass index should be between 19-25. Your Body mass index is 23.74 kg/m. If this is out of the aformentioned range listed, please consider follow up with your Primary Care Provider.   Continue IBgard as needed.  Please follow up as needed.  Thank you for entrusting me with your care and for choosing [facility], Dr. [place] Cellar

## 2021-02-20 NOTE — Patient Instructions (Addendum)
   Medications changes include :   Take 40 mg of lasix, start potassium 20 meq daily.  Decrease losartan to 50 mg daily.    Your prescription(s) have been submitted to your pharmacy. Please take as directed and contact our office if you believe you are having problem(s) with the medication(s).    Please followup in 6 months

## 2021-02-20 NOTE — Progress Notes (Signed)
Subjective:    Patient ID: [name] [name], female    DOB: 09/09/1946, 75 y.o.   MRN: [id number]  HPI The patient is here for follow up of their chronic medical problems, including htn, hyperlipidemia, prediabetes, GERD, migraines.   She was here two weeks ago.  She completed the doxycycline and has been taking the lasix 20 mg daily.  Her SOB has improved, but she remains SOB.   BNP two days ago was 222.  She denies leg edema and chest pain.   She feels tired.  She still feels SOB.  She has to rest frequently when doing things. She basically can not do much.   Echo 4/6 Pulmonary 4/22 Cardiology  4/29    Medications and allergies reviewed with patient and updated if appropriate.  Patient Active Problem List   Diagnosis Date Noted  . Abnormal CT scan of lung 02/06/2021  . Chest pain 02/05/2021  . Cardiomegaly 02/05/2021  . Pleural effusion, bilateral 02/05/2021  . Left lower quadrant abdominal pain 05/01/2020  . Acute post-traumatic headache, not intractable 11/15/2019  . Prediabetes 05/02/2019  . Carbuncle of groin 02/25/2019  . Skin abnormalities 05/07/2018  . Bilateral shoulder pain 12/11/2017  . Gastritis 12/11/2017  . Trigger finger, right little finger 09/11/2017  . Numbness and tingling in left hand 07/20/2017  . Dupuytren's contracture of hand 07/20/2017  . CTS (carpal tunnel syndrome) 07/17/2017  . Bilateral carpal tunnel syndrome 07/17/2017  . Arthralgia 06/30/2017  . Leg cramps 06/10/2017  . 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 Chronic midline low back pain without sciatica 09/15/2016  . Osteopenia 07/20/2016  . Bilateral finger numbness 06/03/2016  . Joint pain 06/03/2016  . Hyperlipidemia 02/09/2015  . Bilateral primary osteoarthritis of knee 10/03/2013  . Lateral meniscal tear 10/03/2013  . 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 Varicose vein of leg   . Allergic rhinitis   . IBS (irritable bowel syndrome) 04/07/2011  . Epigastric  pain 12/26/2010  . Diarrhea 12/25/2010  . Migraine headache 09/27/2010  . Essential hypertension 09/27/2010  . GERD 09/27/2010  . RENAL CALCULUS, HX OF 09/27/2010    Current Outpatient Medications on File Prior to Visit  Medication Sig Dispense Refill  . atorvastatin (LIPITOR) 40 MG tablet Take 1/2 (one-half) tablet by mouth once daily 45 tablet 0  . butalbital-acetaminophen-caffeine (FIORICET) 50-325-40 MG tablet TAKE 1 TABLET BY MOUTH EVERY 4 HOURS AS NEEDED FOR HEADACHE 30 tablet 0  . calcium carbonate (OS-CAL) 600 MG tablet Take 1 tablet (600 mg total) by mouth daily. 60 tablet   . Cholecalciferol (VITAMIN D3) 1000 units CAPS Take 1,000 Units by mouth 3 (three) times a week.     . cyanocobalamin 1000 MCG tablet Take 1,000 mcg by mouth daily.    Marland Kitchen dicyclomine (BENTYL) 10 MG capsule Take 1 capsule (10 mg total) by mouth every 8 (eight) hours as needed for spasms. 90 capsule 1  . gabapentin (NEURONTIN) 100 MG capsule Take  2 CAPSULES AT BEDTIME. 60 capsule 4  . loperamide (IMODIUM A-D) 2 MG tablet Take 1/2 tablet every morning and titrate as needed 30 tablet 0  . losartan (COZAAR) 100 MG tablet Take 1 tablet by mouth once daily 90 tablet 0  . metoprolol succinate (TOPROL-XL) 50 MG 24 hr tablet Take 1 tablet by mouth once daily 90 tablet 0  . pantoprazole (PROTONIX) 40 MG tablet Take 1 tablet (40 mg total) by mouth daily. 180 tablet 0  . Peppermint Oil (IBGARD) 90  MG CPCR Take as directed 16 capsule 0  . furosemide (LASIX) 20 MG tablet Take 1 tablet (20 mg total) by mouth daily for 4 days. 30 tablet 0   No current facility-administered medications on file prior to visit.    Past Medical History:  Diagnosis Date  . Arthritis   . Atypical chest pain   . Diverticulosis   . Gastritis 11/08/2007, 04/12/2012   H pylori bx neg 03/2012  . GERD (gastroesophageal reflux disease) 11/08/2007, 04/12/2012  . Hepatic cyst   . Hiatal hernia 11/08/2007, 04/12/2012  . History of kidney stones   .  Hyperlipidemia   . Hypertension   . IBS (irritable bowel syndrome)    diarrhea predom  . Migraine   . Osteoporosis 09/27/2010   DEXA 01/21/2012: -2.1 spine and R fem, -1.8 L fem s/p fosamax  2004-2011 DEXA 04/18/14 @ [facility]: -2.5 - rec to start Prolia    . PVC's (premature ventricular contractions)   . 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 Shingles outbreak 04/2013    Past Surgical History:  Procedure Laterality Date  . APPENDECTOMY  1958  . COLONOSCOPY    . CYSTOCELE REPAIR  2008  . ESOPHAGOGASTRODUODENOSCOPY    . Maxilofacial  1992  . TONSILLECTOMY  1960    Social History   Socioeconomic History  . Marital status: Married    Spouse name: [name]  . Number of children: 3  . Years of education: Not on file  . Highest education level: Master's degree (e.g., MA, MS, MEng, MEd, MSW, MBA)  Occupational History  . Occupation: retired  Tobacco Use  . Smoking status: Never Smoker  . Smokeless tobacco: Never Used  Vaping Use  . Vaping Use: Never used  Substance and Sexual Activity  . Alcohol use: No  . Drug use: No  . Sexual activity: Not on file  Other Topics Concern  . Not on file  Social History Narrative   Married, lives with spouse. Retired Licensed conveyancer, Print production planner. Has 3 kids (moved to [place] to be closer)- youngest son MD. Dorie Rank to  from [place] 06/2010, lived in [place] x 10years   Social Determinants of Health   Financial Resource Strain: [place]   . Difficulty of Paying Living Expenses: Not hard at all  Food Insecurity: Not on file  Transportation Needs: Not on file  Physical Activity: Not on file  Stress: Not on file  Social Connections: Not on file    Family History  Problem Relation Age of Onset  . Hypertension Mother   . Alzheimer's disease Mother 79  . AAA (abdominal aortic aneurysm) Mother   . Stroke Father 64  . Kidney cancer Brother        mets  . Bone cancer Brother 21  . Colon cancer Neg Hx     Review of  Systems  Constitutional: Positive for fatigue. Negative for fever.  Respiratory: Positive for cough (occ for no reason) and shortness of breath. Negative for wheezing.   Cardiovascular: Positive for palpitations (more noticeable when tired). Negative for chest pain and leg swelling.  Neurological: Positive for headaches. Negative for dizziness and light-headedness.       Objective:   Vitals:   02/21/21 0944  BP: 106/70  Pulse: 64  Temp: 98.5 F (36.9 C)  SpO2: 97%   BP Readings from Last 3 Encounters:  02/21/21 106/70  02/19/21 118/68  02/06/21 100/62   Wt Readings from Last 3 Encounters:  02/21/21 136 lb (  61.7 kg)  02/19/21 134 lb (60.8 kg)  02/06/21 132 lb (59.9 kg)   Body mass index is 24.09 kg/m.   Physical Exam    Constitutional: Appears well-developed and well-nourished. No distress.  HENT:  Head: Normocephalic and atraumatic.  Neck: Neck supple. No tracheal deviation present. No thyromegaly present.  No cervical lymphadenopathy Cardiovascular: Normal rate, regular rhythm and normal heart sounds.   8-5/6 systolic murmur heard. No carotid bruit .  No edema Pulmonary/Chest: Effort normal and breath sounds normal. No respiratory distress. No has no wheezes. No rales.  Skin: Skin is warm and dry. Not diaphoretic.    Lab Results  Component Value Date   WBC 6.4 02/19/2021   HGB 13.8 02/19/2021   HCT 40.5 02/19/2021   PLT 197.0 02/19/2021   GLUCOSE 97 02/19/2021   CHOL 176 02/19/2021   TRIG 153.0 (H) 02/19/2021   HDL 49.50 02/19/2021   LDLDIRECT 75.0 11/02/2019   LDLCALC 96 02/19/2021   ALT 28 02/19/2021   AST 20 02/19/2021   NA 140 02/19/2021   K 3.6 02/19/2021   CL 103 02/19/2021   CREATININE 0.92 02/19/2021   BUN 22 02/19/2021   CO2 27 02/19/2021   TSH 0.92 02/19/2021   HGBA1C 5.6 02/19/2021      Assessment & Plan:    See Problem List for Assessment and Plan of chronic medical problems.    This visit occurred during the SARS-CoV-2 public  health emergency.  Safety protocols were in place, including screening questions prior to the visit, additional usage of staff PPE, and extensive cleaning of exam room while observing appropriate contact time as indicated for disinfecting solutions.

## 2021-02-21 ENCOUNTER — Encounter: Payer: Self-pay | Admitting: Internal Medicine

## 2021-02-21 ENCOUNTER — Other Ambulatory Visit: Payer: Self-pay

## 2021-02-21 ENCOUNTER — Ambulatory Visit (INDEPENDENT_AMBULATORY_CARE_PROVIDER_SITE_OTHER): Payer: PPO | Admitting: Internal Medicine

## 2021-02-21 VITALS — BP 106/70 | HR 64 | Temp 98.5°F | Ht 63.0 in | Wt 136.0 lb

## 2021-02-21 DIAGNOSIS — I10 Essential (primary) hypertension: Secondary | ICD-10-CM

## 2021-02-21 DIAGNOSIS — R7303 Prediabetes: Secondary | ICD-10-CM | POA: Diagnosis not present

## 2021-02-21 DIAGNOSIS — I5020 Unspecified systolic (congestive) heart failure: Secondary | ICD-10-CM | POA: Insufficient documentation

## 2021-02-21 DIAGNOSIS — E782 Mixed hyperlipidemia: Secondary | ICD-10-CM | POA: Diagnosis not present

## 2021-02-21 DIAGNOSIS — I509 Heart failure, unspecified: Secondary | ICD-10-CM | POA: Diagnosis not present

## 2021-02-21 DIAGNOSIS — R011 Cardiac murmur, unspecified: Secondary | ICD-10-CM

## 2021-02-21 HISTORY — DX: Heart failure, unspecified: I50.9

## 2021-02-21 HISTORY — DX: Cardiac murmur, unspecified: R01.1

## 2021-02-21 MED ORDER — POTASSIUM CHLORIDE CRYS ER 20 MEQ PO TBCR: 20.0000 meq | EXTENDED_RELEASE_TABLET | Freq: Every day | ORAL | 3 refills | Status: DC

## 2021-02-21 MED ORDER — LOSARTAN POTASSIUM 100 MG PO TABS: 50.0000 mg | ORAL_TABLET | Freq: Every day | ORAL | 0 refills | Status: DC

## 2021-02-21 MED ORDER — FUROSEMIDE 20 MG PO TABS: 40.0000 mg | ORAL_TABLET | Freq: Every day | ORAL | 2 refills | Status: DC

## 2021-02-21 NOTE — Assessment & Plan Note (Signed)
Acute She has a new murmur within the past month that is very prominent on exam She does have a history of rheumatic fever Possible cause for her new onset heart failure Echo scheduled for 4/6 Cardiology scheduled for 4/29 We will see if we can get her in sooner for both the echocardiogram and cardiology given her symptomatic she is

## 2021-02-21 NOTE — Assessment & Plan Note (Signed)
Chronic bp well controlled Will increase lasix to help with HF, so will decrease losartan to 50 mg daily Lasix 40 mg daily continue metoprolol XL 50 mg daily

## 2021-02-21 NOTE — Assessment & Plan Note (Signed)
Chronic Lipids well controlled Continue atorvastatin 20 mg daily

## 2021-02-21 NOTE — Assessment & Plan Note (Signed)
Acute heart failure within the past month ?  Related to leaky valve Having significant fatigue, decreased stamina and shortness of breath BNP 232 and 20 mg of Lasix Will increase Lasix to 40 mg daily and start potassium 20 mEq daily Echo scheduled for 4/6-we are unable to move this up sooner.  She is on a wait list Cardiology visit scheduled for 4/29-we will try to move this up

## 2021-02-21 NOTE — Assessment & Plan Note (Signed)
Chronic Lab Results  Component Value Date   HGBA1C 5.6 02/19/2021   Sugars in normal range

## 2021-02-26 ENCOUNTER — Ambulatory Visit (INDEPENDENT_AMBULATORY_CARE_PROVIDER_SITE_OTHER): Payer: PPO

## 2021-02-26 DIAGNOSIS — J90 Pleural effusion, not elsewhere classified: Secondary | ICD-10-CM | POA: Diagnosis not present

## 2021-02-26 DIAGNOSIS — R918 Other nonspecific abnormal finding of lung field: Secondary | ICD-10-CM | POA: Diagnosis not present

## 2021-02-26 NOTE — Telephone Encounter (Signed)
Left message for pt to call back. APP slot available to be seen.

## 2021-02-26 NOTE — Telephone Encounter (Signed)
Spoke with pt on the phone about APP slot that recently opened to discuss results of echo and recent shortness of breath. Pt is agreeable to making this appointment. Pt scheduled to see Coletta Memos, NP on Friday 4/8 at 9:15am. All questions answered and pt verbalizes understanding.

## 2021-02-27 ENCOUNTER — Ambulatory Visit (HOSPITAL_COMMUNITY): Payer: PPO | Attending: Cardiology

## 2021-02-27 ENCOUNTER — Other Ambulatory Visit: Payer: Self-pay

## 2021-02-27 DIAGNOSIS — I517 Cardiomegaly: Secondary | ICD-10-CM

## 2021-02-27 LAB — ECHOCARDIOGRAM COMPLETE
Area-P 1/2: 3.66 cm2
S' Lateral: 3 cm

## 2021-02-28 ENCOUNTER — Telehealth: Payer: Self-pay | Admitting: Internal Medicine

## 2021-02-28 ENCOUNTER — Encounter: Payer: Self-pay | Admitting: Internal Medicine

## 2021-02-28 DIAGNOSIS — I340 Nonrheumatic mitral (valve) insufficiency: Secondary | ICD-10-CM | POA: Insufficient documentation

## 2021-02-28 NOTE — Telephone Encounter (Signed)
Message left for patient today with info.  Also sent her a my-chart message as well.

## 2021-02-28 NOTE — Telephone Encounter (Signed)
Please call her - she had her echo yesterday - looks like she is not seeing cardio until tomorrow - is that correct?   Her heart is functioning normally, but one of her valves is at least moderately leaky, probably severely leaky.  This is what is causing her symptoms.  I do not think she needs to see the lung doctor later this month - her chest xray looked good and this explains her symptoms.

## 2021-02-28 NOTE — H&P (View-Only) (Signed)
Cardiology Clinic Note   [name] Date of Encounter: 03/01/2021  Primary Care Provider:  Binnie Rail, MD Primary Cardiologist:  Quay Burow, MD  Patient Profile    [name] [name] 75 year old female presents the clinic today for review of her echocardiogram and pleural effusions.  Past Medical History    Past Medical History:  Diagnosis Date  . Arthritis   . Atypical chest pain   . Diverticulosis   . Gastritis 11/08/2007, 04/12/2012   H pylori bx neg 03/2012  . GERD (gastroesophageal reflux disease) 11/08/2007, 04/12/2012  . Hepatic cyst   . Hiatal hernia 11/08/2007, 04/12/2012  . History of kidney stones   . Hyperlipidemia   . Hypertension   . IBS (irritable bowel syndrome)    diarrhea predom  . Migraine   . Osteoporosis 09/27/2010   DEXA 01/21/2012: -2.1 spine and R fem, -1.8 L fem s/p fosamax  2004-2011 DEXA 04/18/14 @ [facility]: -2.5 - rec to start Prolia    . PVC's (premature ventricular contractions)   . 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 Shingles outbreak 04/2013   Past Surgical History:  Procedure Laterality Date  . APPENDECTOMY  1958  . COLONOSCOPY    . CYSTOCELE REPAIR  2008  . ESOPHAGOGASTRODUODENOSCOPY    . Maxilofacial  1992  . TONSILLECTOMY  1960    Allergies  No Known Allergies  History of Present Illness    [name] [name] has a PMH of essential hypertension, cardiomegaly, CHF, pleural effusion, GERD, gastritis, hyperlipidemia, chronic low back pain, prediabetes, chest pain, and cardiac murmur.  She was  seen by Dr. Gwenlyn Found for complaints of atypical chest pain.  She had never had a heart attack or stroke.  She was noted to have seen Dr. Sallyanne Kuster back in 2011 for atypical chest pain work-up was negative which included nuclear stress test.  She has been out of the the country for several months and had returned to the [place] for about a month.  Since that time she reported daily chest pain.  She had been  under a lot of stress.  Her symptomology was felt to be related to GERD.  She was seen by Dr. Gwenlyn Found on 04/08/2019.  During that time she was doing fairly well.  She noticed increased dyspnea with exertion over the last several months.  She complained of atypical chest pain which was more positional.  She described reproducible pain with pushing on her sternum.  She reported it was similar to pain she had 10 years prior.  At that time she had a negative stress test.  It was not felt to be anginal.  Her blood pressure was well controlled on clonidine losartan and metoprolol.  She presents the clinic today to review her echocardiogram.  She states she has noticed increased shortness of breath over the last 2 weeks.  We reviewed her echocardiogram.  Her son presents with her today.  Case discussed with Dr. Gwenlyn Found.  We will proceed with left and right heart cath, CBC/BMP, as well as TEE.  Both procedures reviewed.  Shared decision making used.  No questions at this time.  We will proceed with described test.  Today she denies chest pain,  lower extremity edema, fatigue, palpitations, melena, hematuria, hemoptysis, diaphoresis, weakness, presyncope, syncope, orthopnea, and PND.   Home Medications    Prior to Admission medications   Medication Sig Start Date End Date Taking? Authorizing Provider  atorvastatin (LIPITOR) 40 MG tablet Take 1/2 (  one-half) tablet by mouth once daily 12/07/20   Binnie Rail, MD  butalbital-acetaminophen-caffeine (FIORICET) 50-325-40 MG tablet TAKE 1 TABLET BY MOUTH EVERY 4 HOURS AS NEEDED FOR HEADACHE 03/13/20   Binnie Rail, MD  calcium carbonate (OS-CAL) 600 MG tablet Take 1 tablet (600 mg total) by mouth daily. 06/03/16   Binnie Rail, MD  Cholecalciferol (VITAMIN D3) 1000 units CAPS Take 1,000 Units by mouth 3 (three) times a week.     [provider]  cyanocobalamin 1000 MCG tablet Take 1,000 mcg by mouth daily.    [provider]  dicyclomine (BENTYL)  10 MG capsule Take 1 capsule (10 mg total) by mouth every 8 (eight) hours as needed for spasms. 12/24/20   Armbruster, Carlota Raspberry, MD  furosemide (LASIX) 20 MG tablet Take 2 tablets (40 mg total) by mouth daily. 02/21/21   Binnie Rail, MD  gabapentin (NEURONTIN) 100 MG capsule Take  2 CAPSULES AT BEDTIME. 01/22/21   Pieter Partridge, DO  loperamide (IMODIUM A-D) 2 MG tablet Take 1/2 tablet every morning and titrate as needed 02/06/21   Binnie Rail, MD  losartan (COZAAR) 100 MG tablet Take 0.5 tablets (50 mg total) by mouth daily. 02/21/21   Binnie Rail, MD  metoprolol succinate (TOPROL-XL) 50 MG 24 hr tablet Take 1 tablet by mouth once daily 12/07/20   Binnie Rail, MD  pantoprazole (PROTONIX) 40 MG tablet Take 1 tablet (40 mg total) by mouth daily. 12/24/20   Armbruster, Carlota Raspberry, MD  Peppermint Oil (IBGARD) 90 MG CPCR Take as directed 12/24/20   Armbruster, Carlota Raspberry, MD  potassium chloride SA (KLOR-CON) 20 MEQ tablet Take 1 tablet (20 mEq total) by mouth daily. 02/21/21   Binnie Rail, MD    Family History    Family History  Problem Relation Age of Onset  . Hypertension Mother   . Alzheimer's disease Mother 32  . AAA (abdominal aortic aneurysm) Mother   . Stroke Father 63  . Kidney cancer Brother        mets  . Bone cancer Brother 68  . Colon cancer Neg Hx    She indicated that her mother is deceased. She indicated that her father is deceased. She indicated that both of her sisters are alive. She indicated that only one of her three brothers is alive. She indicated that her maternal grandmother is deceased. She indicated that her maternal grandfather is deceased. She indicated that her paternal grandmother is deceased. She indicated that her paternal grandfather is deceased. She indicated that both of her sons are alive. She indicated that the status of her neg hx is unknown.  Social History    Social History   Socioeconomic History  . Marital status: Married    Spouse name: [name] [name]   . Number of children: 3  . Years of education: Not on file  . Highest education level: Master's degree (e.g., MA, MS, MEng, MEd, MSW, MBA)  Occupational History  . Occupation: retired  Tobacco Use  . Smoking status: Never Smoker  . Smokeless tobacco: Never Used  Vaping Use  . Vaping Use: Never used  Substance and Sexual Activity  . Alcohol use: No  . Drug use: No  . Sexual activity: Not on file  Other Topics Concern  . Not on file  Social History Narrative   Married, lives with spouse. Retired Licensed conveyancer, Print production planner. Has 3 kids (moved to [place] to be closer)- youngest son MD. Dorie Rank to  [place] from [place] 06/2010, lived in [place] x 10years   Social Determinants of Health   Financial Resource Strain: Low Risk   . Difficulty of Paying Living Expenses: Not hard at all  Food Insecurity: Not on file  Transportation Needs: Not on file  Physical Activity: Not on file  Stress: Not on file  Social Connections: Not on file  Intimate Partner Violence: Not on file     Review of Systems    General:  No chills, fever, night sweats or weight changes.  Cardiovascular:  No chest pain, dyspnea on exertion, edema, orthopnea, palpitations, paroxysmal nocturnal dyspnea. Dermatological: No rash, lesions/masses Respiratory: No cough, dyspnea Urologic: No hematuria, dysuria Abdominal:   No nausea, vomiting, diarrhea, bright red blood per rectum, melena, or hematemesis Neurologic:  No visual changes, wkns, changes in mental status. All other systems reviewed and are otherwise negative except as noted above.  Physical Exam    VS:  BP 130/70 (BP Location: Left Arm, Patient Position: Sitting, Cuff Size: Normal)   Pulse (!) 42   Ht 5\' 3"  (1.6 m)   Wt 134 lb 6.4 oz (61 kg)   BMI 23.81 kg/m  , BMI Body mass index is 23.81 kg/m. GEN: Well nourished, well developed, in no acute distress. HEENT: normal. Neck: Supple, no JVD, carotid bruits, or masses. Cardiac: RRR, 3/6 systolic murmur heard best  near left sternal border, rubs, or gallops. No clubbing, cyanosis, edema.  Radials/DP/PT 2+ and equal bilaterally.  Respiratory:  Respirations regular and unlabored, clear to auscultation bilaterally. GI: Soft, nontender, nondistended, BS + x 4. MS: no deformity or atrophy. Skin: warm and dry, no rash. Neuro:  Strength and sensation are intact. Psych: Normal affect.  Accessory Clinical Findings    Recent Labs: 02/19/2021: ALT 28; BUN 22; Creatinine, Ser 0.92; Hemoglobin 13.8; Platelets 197.0; Potassium 3.6; Pro B Natriuretic peptide (BNP) 222.0; Sodium 140; TSH 0.92   Recent Lipid Panel    Component Value Date/Time   CHOL 176 02/19/2021 0737   TRIG 153.0 (H) 02/19/2021 0737   TRIG 177 06/14/2010 0000   HDL 49.50 02/19/2021 0737   CHOLHDL 4 02/19/2021 0737   VLDL 30.6 02/19/2021 0737   LDLCALC 96 02/19/2021 0737   LDLDIRECT 75.0 11/02/2019 1006    ECG personally reviewed by me today- None today.  Echocardiogram 02/27/2021 IMPRESSIONS    1. Left ventricular ejection fraction, by estimation, is 55 to 60%. The  left ventricle has normal function. The left ventricle has no regional  wall motion abnormalities. Left ventricular diastolic parameters were  normal.  2. Right ventricular systolic function is normal. The right ventricular  size is normal.  3. Left atrial size was moderately dilated.  4. There appears to be a partially flail posterior mitral valve leaflet  with significant turburlent blood flow in the left atrium with at least  moderate MR and may be severe.The mitral valve is abnormal. Moderate  mitral valve regurgitation. No evidence  of mitral stenosis.  5. The aortic valve is normal in structure. Aortic valve regurgitation is  not visualized. Mild aortic valve sclerosis is present, with no evidence  of aortic valve stenosis.  6. The inferior vena cava is normal in size with greater than 50%  respiratory variability, suggesting right atrial pressure of 3  mmHg.R   Recommend TEE for further evaluation of the mitral valve.  Assessment & Plan   1.  Moderate-severe mitral regurgitation-echocardiogram 02/27/2021 shows a partially flat posterior mitral valve leaflet with significant turbulence in  the left atrium.  Denies any increased DOE or activity intolerance.  Case discussed with Dr. Gwenlyn Found.  Discussed referral to cardiothoracic surgery pending results of TEE and cardiac catheterization. Order T EE for further evaluation Order left and right heart cath Order CBC/BMP  Shared Decision Making/Informed Consent The risks [stroke (1 in 1000), death (1 in 66), kidney failure [usually temporary] (1 in 500), bleeding (1 in 200), allergic reaction [possibly serious] (1 in 200)], benefits (diagnostic support and management of coronary artery disease) and alternatives of a cardiac catheterization were discussed in detail with [name] [name] and she is willing to proceed.   Dyspnea on exertion-initially noted back in 2020.  Remained stable. Order TEE Heart healthy low-sodium diet-salty 6 given Maintain physical activity as tolerated  Essential hypertension-BP today 130/70.  Well-controlled at home. Continue clonidine, losartan, metoprolol Heart healthy low-sodium diet-salty 6 given Maintain physical activity  Atypical chest pain-no chest pain today.  Negative nuclear stress test 09/16/2010.  Occasionally notices substernal type discomfort that is unchanged from previous. Continue to monitor Heart healthy low-sodium diet Maintain physical activity  Disposition: Follow-up with Dr. Gwenlyn Found after TEE   Jossie Ng. Yahaira Bruski NP-C    03/01/2021, 9:35 AM [address] Office [phone] Fax [phone]  Notice: This dictation was prepared with Dragon dictation along with smaller phrase technology. Any transcriptional errors that result from this process are unintentional and may not be corrected upon  review.  I spent 20 minutes examining this patient, reviewing medications, and using patient centered shared decision making involving her cardiac care.  Prior to her visit I spent greater than 20 minutes reviewing her past medical history,  medications, and prior cardiac tests.

## 2021-02-28 NOTE — Progress Notes (Addendum)
Cardiology Clinic Note   [name] Date of Encounter: 03/01/2021  Primary Care Provider:  Binnie Rail, MD Primary Cardiologist:  Quay Burow, MD  Patient Profile    [name] [name] 75 year old female presents the clinic today for review of her echocardiogram and pleural effusions.  Past Medical History    Past Medical History:  Diagnosis Date  . Arthritis   . Atypical chest pain   . Diverticulosis   . Gastritis 11/08/2007, 04/12/2012   H pylori bx neg 03/2012  . GERD (gastroesophageal reflux disease) 11/08/2007, 04/12/2012  . Hepatic cyst   . Hiatal hernia 11/08/2007, 04/12/2012  . History of kidney stones   . Hyperlipidemia   . Hypertension   . IBS (irritable bowel syndrome)    diarrhea predom  . Migraine   . Osteoporosis 09/27/2010   DEXA 01/21/2012: -2.1 spine and R fem, -1.8 L fem s/p fosamax  2004-2011 DEXA 04/18/14 @ [facility]: -2.5 - rec to start Prolia    . PVC's (premature ventricular contractions)   . 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 Shingles outbreak 04/2013   Past Surgical History:  Procedure Laterality Date  . APPENDECTOMY  1958  . COLONOSCOPY    . CYSTOCELE REPAIR  2008  . ESOPHAGOGASTRODUODENOSCOPY    . Maxilofacial  1992  . TONSILLECTOMY  1960    Allergies  No Known Allergies  History of Present Illness    Ms. Crance has a PMH of essential hypertension, cardiomegaly, CHF, pleural effusion, GERD, gastritis, hyperlipidemia, chronic low back pain, prediabetes, chest pain, and cardiac murmur.  She was  seen by Dr. Gwenlyn Found for complaints of atypical chest pain.  She had never had a heart attack or stroke.  She was noted to have seen Dr. Sallyanne Kuster back in 2011 for atypical chest pain work-up was negative which included nuclear stress test.  She has been out of the the country for several months and had returned to the [place] for about a month.  Since that time she reported daily chest pain.  She had been  under a lot of stress.  Her symptomology was felt to be related to GERD.  She was seen by Dr. Gwenlyn Found on 04/08/2019.  During that time she was doing fairly well.  She noticed increased dyspnea with exertion over the last several months.  She complained of atypical chest pain which was more positional.  She described reproducible pain with pushing on her sternum.  She reported it was similar to pain she had 10 years prior.  At that time she had a negative stress test.  It was not felt to be anginal.  Her blood pressure was well controlled on clonidine losartan and metoprolol.  She presents the clinic today to review her echocardiogram.  She states she has noticed increased shortness of breath over the last 2 weeks.  We reviewed her echocardiogram.  Her son presents with her today.  Case discussed with Dr. Gwenlyn Found.  We will proceed with left and right heart cath, CBC/BMP, as well as TEE.  Both procedures reviewed.  Shared decision making used.  No questions at this time.  We will proceed with described test.  Today she denies chest pain,  lower extremity edema, fatigue, palpitations, melena, hematuria, hemoptysis, diaphoresis, weakness, presyncope, syncope, orthopnea, and PND.   Home Medications    Prior to Admission medications   Medication Sig Start Date End Date Taking? Authorizing Provider  atorvastatin (LIPITOR) 40 MG tablet Take 1/2 (  one-half) tablet by mouth once daily 12/07/20   Binnie Rail, MD  butalbital-acetaminophen-caffeine (FIORICET) 50-325-40 MG tablet TAKE 1 TABLET BY MOUTH EVERY 4 HOURS AS NEEDED FOR HEADACHE 03/13/20   Binnie Rail, MD  calcium carbonate (OS-CAL) 600 MG tablet Take 1 tablet (600 mg total) by mouth daily. 06/03/16   Binnie Rail, MD  Cholecalciferol (VITAMIN D3) 1000 units CAPS Take 1,000 Units by mouth 3 (three) times a week.     [provider]  cyanocobalamin 1000 MCG tablet Take 1,000 mcg by mouth daily.    [provider]  dicyclomine (BENTYL)  10 MG capsule Take 1 capsule (10 mg total) by mouth every 8 (eight) hours as needed for spasms. 12/24/20   Armbruster, Carlota Raspberry, MD  furosemide (LASIX) 20 MG tablet Take 2 tablets (40 mg total) by mouth daily. 02/21/21   Binnie Rail, MD  gabapentin (NEURONTIN) 100 MG capsule Take  2 CAPSULES AT BEDTIME. 01/22/21   Pieter Partridge, DO  loperamide (IMODIUM A-D) 2 MG tablet Take 1/2 tablet every morning and titrate as needed 02/06/21   Binnie Rail, MD  losartan (COZAAR) 100 MG tablet Take 0.5 tablets (50 mg total) by mouth daily. 02/21/21   Binnie Rail, MD  metoprolol succinate (TOPROL-XL) 50 MG 24 hr tablet Take 1 tablet by mouth once daily 12/07/20   Binnie Rail, MD  pantoprazole (PROTONIX) 40 MG tablet Take 1 tablet (40 mg total) by mouth daily. 12/24/20   Armbruster, Carlota Raspberry, MD  Peppermint Oil (IBGARD) 90 MG CPCR Take as directed 12/24/20   Armbruster, Carlota Raspberry, MD  potassium chloride SA (KLOR-CON) 20 MEQ tablet Take 1 tablet (20 mEq total) by mouth daily. 02/21/21   Binnie Rail, MD    Family History    Family History  Problem Relation Age of Onset  . Hypertension Mother   . Alzheimer's disease Mother 41  . AAA (abdominal aortic aneurysm) Mother   . Stroke Father 22  . Kidney cancer Brother        mets  . Bone cancer Brother 80  . Colon cancer Neg Hx    She indicated that her mother is deceased. She indicated that her father is deceased. She indicated that both of her sisters are alive. She indicated that only one of her three brothers is alive. She indicated that her maternal grandmother is deceased. She indicated that her maternal grandfather is deceased. She indicated that her paternal grandmother is deceased. She indicated that her paternal grandfather is deceased. She indicated that both of her sons are alive. She indicated that the status of her neg hx is unknown.  Social History    Social History   Socioeconomic History  . Marital status: Married    Spouse name: [name]   . Number of children: 3  . Years of education: Not on file  . Highest education level: Master's degree (e.g., MA, MS, MEng, MEd, MSW, MBA)  Occupational History  . Occupation: retired  Tobacco Use  . Smoking status: Never Smoker  . Smokeless tobacco: Never Used  Vaping Use  . Vaping Use: Never used  Substance and Sexual Activity  . Alcohol use: No  . Drug use: No  . Sexual activity: Not on file  Other Topics Concern  . Not on file  Social History Narrative   Married, lives with spouse. Retired Licensed conveyancer, Print production planner. Has 3 kids (moved to [place] to be closer)- youngest son MD. Dorie Rank to  [place] from [place] 06/2010, lived in [place] x 10years   Social Determinants of Health   Financial Resource Strain: Low Risk   . Difficulty of Paying Living Expenses: Not hard at all  Food Insecurity: Not on file  Transportation Needs: Not on file  Physical Activity: Not on file  Stress: Not on file  Social Connections: Not on file  Intimate Partner Violence: Not on file     Review of Systems    General:  No chills, fever, night sweats or weight changes.  Cardiovascular:  No chest pain, dyspnea on exertion, edema, orthopnea, palpitations, paroxysmal nocturnal dyspnea. Dermatological: No rash, lesions/masses Respiratory: No cough, dyspnea Urologic: No hematuria, dysuria Abdominal:   No nausea, vomiting, diarrhea, bright red blood per rectum, melena, or hematemesis Neurologic:  No visual changes, wkns, changes in mental status. All other systems reviewed and are otherwise negative except as noted above.  Physical Exam    VS:  BP 130/70 (BP Location: Left Arm, Patient Position: Sitting, Cuff Size: Normal)   Pulse (!) 42   Ht 5\' 3"  (1.6 m)   Wt 134 lb 6.4 oz (61 kg)   BMI 23.81 kg/m  , BMI Body mass index is 23.81 kg/m. GEN: Well nourished, well developed, in no acute distress. HEENT: normal. Neck: Supple, no JVD, carotid bruits, or masses. Cardiac: RRR, 3/6 systolic murmur heard best  near left sternal border, rubs, or gallops. No clubbing, cyanosis, edema.  Radials/DP/PT 2+ and equal bilaterally.  Respiratory:  Respirations regular and unlabored, clear to auscultation bilaterally. GI: Soft, nontender, nondistended, BS + x 4. MS: no deformity or atrophy. Skin: warm and dry, no rash. Neuro:  Strength and sensation are intact. Psych: Normal affect.  Accessory Clinical Findings    Recent Labs: 02/19/2021: ALT 28; BUN 22; Creatinine, Ser 0.92; Hemoglobin 13.8; Platelets 197.0; Potassium 3.6; Pro B Natriuretic peptide (BNP) 222.0; Sodium 140; TSH 0.92   Recent Lipid Panel    Component Value Date/Time   CHOL 176 02/19/2021 0737   TRIG 153.0 (H) 02/19/2021 0737   TRIG 177 06/14/2010 0000   HDL 49.50 02/19/2021 0737   CHOLHDL 4 02/19/2021 0737   VLDL 30.6 02/19/2021 0737   LDLCALC 96 02/19/2021 0737   LDLDIRECT 75.0 11/02/2019 1006    ECG personally reviewed by me today- None today.  Echocardiogram 02/27/2021 IMPRESSIONS    1. Left ventricular ejection fraction, by estimation, is 55 to 60%. The  left ventricle has normal function. The left ventricle has no regional  wall motion abnormalities. Left ventricular diastolic parameters were  normal.  2. Right ventricular systolic function is normal. The right ventricular  size is normal.  3. Left atrial size was moderately dilated.  4. There appears to be a partially flail posterior mitral valve leaflet  with significant turburlent blood flow in the left atrium with at least  moderate MR and may be severe.The mitral valve is abnormal. Moderate  mitral valve regurgitation. No evidence  of mitral stenosis.  5. The aortic valve is normal in structure. Aortic valve regurgitation is  not visualized. Mild aortic valve sclerosis is present, with no evidence  of aortic valve stenosis.  6. The inferior vena cava is normal in size with greater than 50%  respiratory variability, suggesting right atrial pressure of 3  mmHg.R   Recommend TEE for further evaluation of the mitral valve.  Assessment & Plan   1.  Moderate-severe mitral regurgitation-echocardiogram 02/27/2021 shows a partially flat posterior mitral valve leaflet with significant turbulence in  the left atrium.  Denies any increased DOE or activity intolerance.  Case discussed with Dr. Gwenlyn Found.  Discussed referral to cardiothoracic surgery pending results of TEE and cardiac catheterization. Order T EE for further evaluation Order left and right heart cath Order CBC/BMP  Shared Decision Making/Informed Consent The risks [stroke (1 in 1000), death (1 in 65), kidney failure [usually temporary] (1 in 500), bleeding (1 in 200), allergic reaction [possibly serious] (1 in 200)], benefits (diagnostic support and management of coronary artery disease) and alternatives of a cardiac catheterization were discussed in detail with Ms. Mullany and she is willing to proceed.   Dyspnea on exertion-initially noted back in 2020.  Remained stable. Order TEE Heart healthy low-sodium diet-salty 6 given Maintain physical activity as tolerated  Essential hypertension-BP today 130/70.  Well-controlled at home. Continue clonidine, losartan, metoprolol Heart healthy low-sodium diet-salty 6 given Maintain physical activity  Atypical chest pain-no chest pain today.  Negative nuclear stress test 09/16/2010.  Occasionally notices substernal type discomfort that is unchanged from previous. Continue to monitor Heart healthy low-sodium diet Maintain physical activity  Disposition: Follow-up with Dr. Gwenlyn Found after TEE   Jossie Ng. Elliet Goodnow NP-C    03/01/2021, 9:35 AM [address] Office [phone] Fax [phone]  Notice: This dictation was prepared with Dragon dictation along with smaller phrase technology. Any transcriptional errors that result from this process are unintentional and may not be corrected upon  review.  I spent 20 minutes examining this patient, reviewing medications, and using patient centered shared decision making involving her cardiac care.  Prior to her visit I spent greater than 20 minutes reviewing her past medical history,  medications, and prior cardiac tests.

## 2021-03-01 ENCOUNTER — Encounter: Payer: Self-pay | Admitting: General Practice

## 2021-03-01 ENCOUNTER — Ambulatory Visit: Payer: PPO | Admitting: General Practice

## 2021-03-01 ENCOUNTER — Other Ambulatory Visit: Payer: Self-pay

## 2021-03-01 VITALS — BP 130/70 | HR 42 | Ht 63.0 in | Wt 134.4 lb

## 2021-03-01 DIAGNOSIS — I10 Essential (primary) hypertension: Secondary | ICD-10-CM

## 2021-03-01 DIAGNOSIS — I340 Nonrheumatic mitral (valve) insufficiency: Secondary | ICD-10-CM | POA: Diagnosis not present

## 2021-03-01 DIAGNOSIS — R0789 Other chest pain: Secondary | ICD-10-CM | POA: Diagnosis not present

## 2021-03-01 DIAGNOSIS — R0600 Dyspnea, unspecified: Secondary | ICD-10-CM | POA: Diagnosis not present

## 2021-03-01 DIAGNOSIS — R0609 Other forms of dyspnea: Secondary | ICD-10-CM

## 2021-03-01 MED ORDER — SODIUM CHLORIDE 0.9% FLUSH: 3.0000 mL | Freq: Two times a day (BID) | INTRAVENOUS | Status: DC

## 2021-03-01 NOTE — Patient Instructions (Addendum)
You are scheduled for a TEE on 03-14-2021  with Dr. Radford Pax  Please arrive at the [facility] (Main Entrance A) at [facility]: [place], [place] [address] at [place]. (1 hour prior to procedure unless lab work is needed; if lab work is needed arrive 1.5 hours ahead)  DIET: Nothing to eat or drink after midnight except a sip of water with medications (see medication instructions below)  Medication Instructions: TAKE ALL YOUR [place] will need a COVID-19  test prior to your procedure. You are scheduled for 03-11-2021 at 945AM. This is a Drive Up Visit at [address] [place], [place] [address]. Someone will direct you to the appropriate testing line. Stay in your car and someone will be with you shortly.  Labs: BMET AND CBC BEFORE YOUR COVID TEST 03-11-2021  You must have a responsible person to drive you home and stay in the waiting area during your procedure. Failure to do so could result in cancellation.  Bring your insurance cards.  *Special Note: Every effort is made to have your procedure done on time. Occasionally there are emergencies that occur at the hospital that may cause delays. Please be patient if a delay does occur.   You are scheduled for a Cardiac Catheterization on Thursday, April 21 with Dr. Quay Burow.  1. Please arrive at the [facility] (Main Entrance A) at [facility]: [address] [place], [place] [address] at 11:00 AM (This time is two hours before your procedure to ensure your preparation). Free valet parking service is available.   Special note: Every effort is made to have your procedure done on time. Please understand that emergencies sometimes delay scheduled procedures.  2. Diet: Do not eat solid foods after midnight.  The patient may have clear liquids until 5am upon the day of the procedure.  3. Labs: You will need to have blood drawn on Monday, April 18 at [place], [place]. Open: 8am - 5pm (Lunch 12:30 - 1:30)   Phone: [phone]. You do not need to be fasting.  4. Medication instructions in preparation for your procedure:  You will need a COVID-19  test prior to your procedure. You are scheduled for 03-11-2021 at 945AM. This is a Drive Up Visit at [address] [place], [place] [address]. Someone will direct you to the appropriate testing line. Stay in your car and someone will be with you shortly.  On the morning of your procedure, take your Aspirin and any morning medicines NOT listed above.  You may use sips of water.  5. Plan for one night stay--bring personal belongings. 6. Bring a current list of your medications and current insurance cards. 7. You MUST have a responsible person to drive you home. 8. Someone MUST be with you the first 24 hours after you arrive home or your discharge will be delayed. 9. Please wear clothes that are easy to get on and off and wear slip-on shoes.  Thank you for allowing [place] to care for you!   -- [facility]

## 2021-03-04 ENCOUNTER — Telehealth: Payer: Self-pay | Admitting: Gastroenterology

## 2021-03-04 NOTE — Telephone Encounter (Signed)
[facility] called to request refill on Pantoprazole.

## 2021-03-04 NOTE — Progress Notes (Addendum)
    Chronic Care Management Pharmacy Assistant   Name: Angeleah Labrake  MRN: [id number] DOB: 06-18-1946   Reason for Encounter: Medication Refill    Called and spoke with Janett Billow at Dr. Havery Moros office about a refill for Pantoprazole 40 mg.  Orinda Kenner, [place] Clinical Pharmacists Assistant [phone]

## 2021-03-04 NOTE — Telephone Encounter (Signed)
Called pt.

## 2021-03-04 NOTE — Telephone Encounter (Signed)
Reviewed patient's [facility] medication and Epic medication profile assuring there are no discrepancies or gaps in therapy. Confirmed all fill dates appropriate and verified with patient that there is a sufficient quantity of all prescribed medications at home. Informed patient to call me any time if needing medications before scheduled deliveries. The anticipated medication sync date is 03/05/21.  Updated preferred pharmacy in chart.  [facility] - [place], [place] - [address] [place] [address] Phone: [phone] Fax: [phone]

## 2021-03-04 NOTE — Telephone Encounter (Signed)
Called and spoke to patient. As she mentioned in her OV with Dr. Havery Moros last month, she is no longer taking pantoprazole regularly and she is not having symptoms except very occasionally. Patient indicated she has enough medication to take PRN right now. No refill sent per patient request.

## 2021-03-05 ENCOUNTER — Other Ambulatory Visit: Payer: Self-pay

## 2021-03-05 ENCOUNTER — Telehealth: Payer: Self-pay | Admitting: Cardiovascular Disease

## 2021-03-05 DIAGNOSIS — I10 Essential (primary) hypertension: Secondary | ICD-10-CM

## 2021-03-05 MED ORDER — LOSARTAN POTASSIUM 100 MG PO TABS: 50.0000 mg | ORAL_TABLET | Freq: Every day | ORAL | 0 refills | Status: DC

## 2021-03-05 MED ORDER — ATORVASTATIN CALCIUM 40 MG PO TABS: ORAL_TABLET | ORAL | 2 refills | Status: DC

## 2021-03-05 MED ORDER — METOPROLOL SUCCINATE ER 50 MG PO TB24: 50.0000 mg | ORAL_TABLET | Freq: Every day | ORAL | 1 refills | Status: DC

## 2021-03-05 MED ORDER — FUROSEMIDE 20 MG PO TABS: 40.0000 mg | ORAL_TABLET | Freq: Every day | ORAL | 2 refills | Status: DC

## 2021-03-05 NOTE — Telephone Encounter (Signed)
Spoke with patient husband. Patient is schedule for TEE at 8 am, and Right and left heart cath at 1:30 pam on 03/14/21. Depending on the last procedure if no intervention is done will possible be release from hospital mid-evening.   Follow up appointment will be on 03/22/21 with Arnold Long NP.-- ( the plan of care should in progress by this visit)   Husband voiced understanding

## 2021-03-05 NOTE — Telephone Encounter (Signed)
Patient's husband calling to find out how long the patient will need to recover in the hospital after her procedure 4/21. He also would like to know after the results are back how long it will be to make a decision on what to do next.

## 2021-03-11 ENCOUNTER — Other Ambulatory Visit (HOSPITAL_COMMUNITY)
Admission: RE | Admit: 2021-03-11 | Discharge: 2021-03-11 | Disposition: A | Discharge status: Home / Self Care | Payer: PPO | Source: Ambulatory Visit | Attending: Cardiology | Admitting: Cardiology

## 2021-03-11 DIAGNOSIS — Z01812 Encounter for preprocedural laboratory examination: Secondary | ICD-10-CM | POA: Insufficient documentation

## 2021-03-11 DIAGNOSIS — Z20822 Contact with and (suspected) exposure to covid-19: Secondary | ICD-10-CM | POA: Diagnosis not present

## 2021-03-11 DIAGNOSIS — I340 Nonrheumatic mitral (valve) insufficiency: Secondary | ICD-10-CM | POA: Diagnosis not present

## 2021-03-11 LAB — CBC
Hematocrit: 39 % (ref 34.0–46.6)
Hemoglobin: 13.1 g/dL (ref 11.1–15.9)
MCH: 33.2 pg — ABNORMAL HIGH (ref 26.6–33.0)
MCHC: 33.6 g/dL (ref 31.5–35.7)
MCV: 99 fL — ABNORMAL HIGH (ref 79–97)
Platelets: 208 10*3/uL (ref 150–450)
RBC: 3.95 x10E6/uL (ref 3.77–5.28)
RDW: 12.6 % (ref 11.7–15.4)
WBC: 7.3 10*3/uL (ref 3.4–10.8)

## 2021-03-11 LAB — BASIC METABOLIC PANEL
BUN/Creatinine Ratio: 22 (ref 12–28)
BUN: 20 mg/dL (ref 8–27)
CO2: 27 mmol/L (ref 20–29)
Calcium: 9.9 mg/dL (ref 8.7–10.3)
Chloride: 103 mmol/L (ref 96–106)
Creatinine, Ser: 0.91 mg/dL (ref 0.57–1.00)
Glucose: 100 mg/dL — ABNORMAL HIGH (ref 65–99)
Potassium: 4.3 mmol/L (ref 3.5–5.2)
Sodium: 143 mmol/L (ref 134–144)
eGFR: 66 mL/min/{1.73_m2} (ref >59)

## 2021-03-11 LAB — SARS CORONAVIRUS 2 (TAT 6-24 HRS): SARS Coronavirus 2: NEGATIVE

## 2021-03-13 ENCOUNTER — Other Ambulatory Visit: Payer: Self-pay | Admitting: Cardiology

## 2021-03-13 ENCOUNTER — Telehealth: Payer: Self-pay

## 2021-03-13 NOTE — Anesthesia Preprocedure Evaluation (Addendum)
Anesthesia Evaluation  Patient identified by MRN, date of birth, ID band Patient awake    Reviewed: Allergy & Precautions, H&P , NPO status , Patient's Chart, lab work & pertinent test results  Airway Mallampati: II  TM Distance: >3 FB Neck ROM: Full    Dental no notable dental hx. (+) Teeth Intact, Caps, Dental Advisory Given,    Pulmonary neg pulmonary ROS,    Pulmonary exam normal breath sounds clear to auscultation       Cardiovascular Exercise Tolerance: Good hypertension, Pt. on medications and Pt. on home beta blockers + angina +CHF and + DOE  negative cardio ROS  + Valvular Problems/Murmurs MR  Rhythm:Regular Rate:Normal + Systolic murmurs ECHO 2/29 FINDINGS  Left Ventricle: Left ventricular ejection fraction, by estimation, is 55  to 60%. The left ventricle has normal function. The left ventricle has no  regional wall motion abnormalities. The left ventricular internal cavity  size was normal in size. There appears to be a partially flail posterior mitral valve leaflet  with significant turburlent blood flow in the left atrium with at least  moderate MR and may be severe.The mitral valve is abnormal. Moderate  mitral valve regurgitation. No evidence  of mitral stenosis.     Neuro/Psych  Headaches,  Neuromuscular disease negative neurological ROS  negative psych ROS   GI/Hepatic negative GI ROS, Neg liver ROS, hiatal hernia, GERD  Medicated and Controlled,  Endo/Other  negative endocrine ROS  Renal/GU negative Renal ROS  negative genitourinary   Musculoskeletal negative musculoskeletal ROS (+) Arthritis , Osteoarthritis,    Abdominal   Peds negative pediatric ROS (+)  Hematology negative hematology ROS (+)   Anesthesia Other Findings   Reproductive/Obstetrics negative OB ROS                            Anesthesia Physical Anesthesia Plan  ASA: III  Anesthesia Plan: MAC    Post-op Pain Management:    Induction: Intravenous  PONV Risk Score and Plan: 2  Airway Management Planned: Nasal Cannula, Natural Airway and Simple Face Mask  Additional Equipment:   Intra-op Plan:   Post-operative Plan:   Informed Consent: I have reviewed the patients History and Physical, chart, labs and discussed the procedure including the risks, benefits and alternatives for the proposed anesthesia with the patient or authorized representative who has indicated his/her understanding and acceptance.       Plan Discussed with: Anesthesiologist and CRNA  Anesthesia Plan Comments:         Anesthesia Quick Evaluation

## 2021-03-13 NOTE — Telephone Encounter (Addendum)
  Pt contacted pre-catheterization scheduled at [facility] for: 03/14/21 Verified arrival time and place: [facility]) at: 7am (TEE)   No solid food after midnight prior to cath, clear liquids until 5 AM day of procedure. CONTRAST ALLERGY:NO  AM meds can be  taken pre-cath with sips of water including: ASA 81 mg  Hold lasix and K   Confirmed patient has responsible adult to drive home post procedure and be with patient first 24 hours after arriving home:  You are allowed ONE visitor in the waiting room during the time you are at the hospital for your procedure. Both you and your visitor must wear a mask once you enter the hospital.

## 2021-03-13 NOTE — Addendum Note (Signed)
Addended by: Deberah Pelton on: 03/13/2021 07:50 PM   Modules accepted: Orders, SmartSet

## 2021-03-14 ENCOUNTER — Ambulatory Visit (HOSPITAL_BASED_OUTPATIENT_CLINIC_OR_DEPARTMENT_OTHER)
Admission: RE | Admit: 2021-03-14 | Discharge: 2021-03-14 | Disposition: A | Discharge status: Home / Self Care | Payer: PPO | Source: Ambulatory Visit | Attending: Cardiology | Admitting: Cardiology

## 2021-03-14 ENCOUNTER — Ambulatory Visit (HOSPITAL_COMMUNITY)
Admission: RE | Admit: 2021-03-14 | Discharge: 2021-03-14 | Disposition: A | Discharge status: Home / Self Care | Payer: PPO | Source: Ambulatory Visit | Attending: Cardiology | Admitting: Cardiology

## 2021-03-14 ENCOUNTER — Encounter (HOSPITAL_COMMUNITY)
Admission: RE | Disposition: A | Discharge status: Home / Self Care | Payer: Self-pay | Source: Ambulatory Visit | Attending: Cardiology

## 2021-03-14 ENCOUNTER — Ambulatory Visit (HOSPITAL_COMMUNITY): Payer: PPO | Admitting: Anesthesiology

## 2021-03-14 ENCOUNTER — Other Ambulatory Visit: Payer: Self-pay

## 2021-03-14 ENCOUNTER — Encounter (HOSPITAL_COMMUNITY): Payer: Self-pay | Admitting: Cardiology

## 2021-03-14 DIAGNOSIS — Z79899 Other long term (current) drug therapy: Secondary | ICD-10-CM | POA: Diagnosis not present

## 2021-03-14 DIAGNOSIS — E785 Hyperlipidemia, unspecified: Secondary | ICD-10-CM | POA: Insufficient documentation

## 2021-03-14 DIAGNOSIS — Z8249 Family history of ischemic heart disease and other diseases of the circulatory system: Secondary | ICD-10-CM | POA: Insufficient documentation

## 2021-03-14 DIAGNOSIS — R0609 Other forms of dyspnea: Secondary | ICD-10-CM

## 2021-03-14 DIAGNOSIS — K219 Gastro-esophageal reflux disease without esophagitis: Secondary | ICD-10-CM | POA: Insufficient documentation

## 2021-03-14 DIAGNOSIS — I340 Nonrheumatic mitral (valve) insufficiency: Secondary | ICD-10-CM | POA: Insufficient documentation

## 2021-03-14 DIAGNOSIS — I509 Heart failure, unspecified: Secondary | ICD-10-CM | POA: Insufficient documentation

## 2021-03-14 DIAGNOSIS — R7303 Prediabetes: Secondary | ICD-10-CM | POA: Insufficient documentation

## 2021-03-14 DIAGNOSIS — G43909 Migraine, unspecified, not intractable, without status migrainosus: Secondary | ICD-10-CM | POA: Diagnosis not present

## 2021-03-14 DIAGNOSIS — I083 Combined rheumatic disorders of mitral, aortic and tricuspid valves: Secondary | ICD-10-CM | POA: Diagnosis not present

## 2021-03-14 DIAGNOSIS — I110 Hypertensive heart disease with heart failure: Secondary | ICD-10-CM | POA: Diagnosis not present

## 2021-03-14 DIAGNOSIS — R0600 Dyspnea, unspecified: Secondary | ICD-10-CM

## 2021-03-14 HISTORY — PX: RIGHT/LEFT HEART CATH AND CORONARY ANGIOGRAPHY: CATH118266

## 2021-03-14 HISTORY — PX: TEE WITHOUT CARDIOVERSION: SHX5443

## 2021-03-14 HISTORY — PX: BUBBLE STUDY: SHX6837

## 2021-03-14 LAB — POCT I-STAT 7, (LYTES, BLD GAS, ICA,H+H)
Acid-Base Excess: 0 mmol/L (ref 0.0–2.0)
Bicarbonate: 25.3 mmol/L (ref 20.0–28.0)
Calcium, Ion: 1.27 mmol/L (ref 1.15–1.40)
HCT: 38 % (ref 36.0–46.0)
Hemoglobin: 12.9 g/dL (ref 12.0–15.0)
O2 Saturation: 99 %
Potassium: 4 mmol/L (ref 3.5–5.1)
Sodium: 141 mmol/L (ref 135–145)
TCO2: 27 mmol/L (ref 22–32)
pCO2 arterial: 40.8 mmHg (ref 32.0–48.0)
pH, Arterial: 7.4 (ref 7.350–7.450)
pO2, Arterial: 130 mmHg — ABNORMAL HIGH (ref 83.0–108.0)

## 2021-03-14 LAB — POCT I-STAT EG7
Acid-Base Excess: 0 mmol/L (ref 0.0–2.0)
Acid-Base Excess: 1 mmol/L (ref 0.0–2.0)
Bicarbonate: 26.1 mmol/L (ref 20.0–28.0)
Bicarbonate: 26.1 mmol/L (ref 20.0–28.0)
Calcium, Ion: 1.27 mmol/L (ref 1.15–1.40)
Calcium, Ion: 1.28 mmol/L (ref 1.15–1.40)
HCT: 38 % (ref 36.0–46.0)
HCT: 38 % (ref 36.0–46.0)
Hemoglobin: 12.9 g/dL (ref 12.0–15.0)
Hemoglobin: 12.9 g/dL (ref 12.0–15.0)
O2 Saturation: 76 %
O2 Saturation: 77 %
Potassium: 3.9 mmol/L (ref 3.5–5.1)
Potassium: 4 mmol/L (ref 3.5–5.1)
Sodium: 142 mmol/L (ref 135–145)
Sodium: 143 mmol/L (ref 135–145)
TCO2: 27 mmol/L (ref 22–32)
TCO2: 27 mmol/L (ref 22–32)
pCO2, Ven: 44.7 mmHg (ref 44.0–60.0)
pCO2, Ven: 44.9 mmHg (ref 44.0–60.0)
pH, Ven: 7.372 (ref 7.250–7.430)
pH, Ven: 7.374 (ref 7.250–7.430)
pO2, Ven: 42 mmHg (ref 32.0–45.0)
pO2, Ven: 43 mmHg (ref 32.0–45.0)

## 2021-03-14 SURGERY — ECHOCARDIOGRAM, TRANSESOPHAGEAL
Anesthesia: Monitor Anesthesia Care

## 2021-03-14 SURGERY — RIGHT/LEFT HEART CATH AND CORONARY ANGIOGRAPHY
Anesthesia: LOCAL

## 2021-03-14 MED ORDER — LIDOCAINE 2% (20 MG/ML) 5 ML SYRINGE
INTRAMUSCULAR | Status: DC | PRN
  Administered 2021-03-14: 60 mg via INTRAVENOUS

## 2021-03-14 MED ORDER — SODIUM CHLORIDE 0.9 % WEIGHT BASED INFUSION: 3.0000 mL/kg/h | INTRAVENOUS | Status: AC

## 2021-03-14 MED ORDER — POTASSIUM CHLORIDE CRYS ER 20 MEQ PO TBCR: EXTENDED_RELEASE_TABLET | ORAL | Status: DC

## 2021-03-14 MED ORDER — LIDOCAINE HCL (PF) 1 % IJ SOLN
INTRAMUSCULAR | Status: AC
  Filled 2021-03-14: qty 30

## 2021-03-14 MED ORDER — METOPROLOL SUCCINATE ER 50 MG PO TB24: ORAL_TABLET | ORAL | 1 refills | Status: DC

## 2021-03-14 MED ORDER — HEPARIN SODIUM (PORCINE) 1000 UNIT/ML IJ SOLN
INTRAMUSCULAR | Status: DC | PRN
  Administered 2021-03-14: 3000 [IU] via INTRAVENOUS

## 2021-03-14 MED ORDER — ASPIRIN 81 MG PO CHEW: 81.0000 mg | CHEWABLE_TABLET | ORAL | Status: DC

## 2021-03-14 MED ORDER — VERAPAMIL HCL 2.5 MG/ML IV SOLN
INTRA_ARTERIAL | Status: DC | PRN
  Administered 2021-03-14: 5 mL via INTRA_ARTERIAL

## 2021-03-14 MED ORDER — HEPARIN SODIUM (PORCINE) 1000 UNIT/ML IJ SOLN
INTRAMUSCULAR | Status: AC
  Filled 2021-03-14: qty 1

## 2021-03-14 MED ORDER — EPHEDRINE SULFATE-NACL 50-0.9 MG/10ML-% IV SOSY
PREFILLED_SYRINGE | INTRAVENOUS | Status: DC | PRN
  Administered 2021-03-14: 10 mg via INTRAVENOUS

## 2021-03-14 MED ORDER — PROPOFOL 500 MG/50ML IV EMUL
INTRAVENOUS | Status: DC | PRN
  Administered 2021-03-14: 100 ug/kg/min via INTRAVENOUS

## 2021-03-14 MED ORDER — HEPARIN (PORCINE) IN NACL 1000-0.9 UT/500ML-% IV SOLN
INTRAVENOUS | Status: DC | PRN
  Administered 2021-03-14 (×3): 500 mL

## 2021-03-14 MED ORDER — SODIUM CHLORIDE 0.9 % IV SOLN: INTRAVENOUS | Status: DC

## 2021-03-14 MED ORDER — PROPOFOL 10 MG/ML IV BOLUS
INTRAVENOUS | Status: DC | PRN
  Administered 2021-03-14: 30 mg via INTRAVENOUS

## 2021-03-14 MED ORDER — SODIUM CHLORIDE 0.9 % WEIGHT BASED INFUSION: 1.0000 mL/kg/h | INTRAVENOUS | Status: DC

## 2021-03-14 MED ORDER — VERAPAMIL HCL 2.5 MG/ML IV SOLN
INTRAVENOUS | Status: AC
  Filled 2021-03-14: qty 2

## 2021-03-14 MED ORDER — SODIUM CHLORIDE 0.9% FLUSH: 3.0000 mL | INTRAVENOUS | Status: DC | PRN

## 2021-03-14 MED ORDER — HEPARIN (PORCINE) IN NACL 1000-0.9 UT/500ML-% IV SOLN
INTRAVENOUS | Status: AC
  Filled 2021-03-14: qty 1500

## 2021-03-14 MED ORDER — SODIUM CHLORIDE 0.9 % IV SOLN: 250.0000 mL | INTRAVENOUS | Status: DC | PRN

## 2021-03-14 MED ORDER — GABAPENTIN 100 MG PO CAPS: 100.0000 mg | ORAL_CAPSULE | Freq: Every day | ORAL | Status: DC

## 2021-03-14 MED ORDER — IOHEXOL 350 MG/ML SOLN
INTRAVENOUS | Status: DC | PRN
  Administered 2021-03-14: 70 mL

## 2021-03-14 MED ORDER — LIDOCAINE HCL (PF) 1 % IJ SOLN
INTRAMUSCULAR | Status: DC | PRN
  Administered 2021-03-14: 1 mL
  Administered 2021-03-14: 2 mL

## 2021-03-14 SURGICAL SUPPLY — 13 items
CATH INFINITI 5FR ANG PIGTAIL (CATHETERS) ×2 IMPLANT
CATH OPTITORQUE TIG 4.0 5F (CATHETERS) ×2 IMPLANT
CATH SWAN GANZ 7F STRAIGHT (CATHETERS) ×2 IMPLANT
DEVICE RAD COMP TR BAND LRG (VASCULAR PRODUCTS) ×2 IMPLANT
GLIDESHEATH SLEND A-KIT 6F 22G (SHEATH) ×2 IMPLANT
GLIDESHEATH SLENDER 7FR .021G (SHEATH) ×2 IMPLANT
GUIDEWIRE INQWIRE 1.5J.035X260 (WIRE) ×2 IMPLANT
INQWIRE 1.5J .035X260CM (WIRE) ×4
KIT HEART LEFT (KITS) ×2 IMPLANT
PACK CARDIAC CATHETERIZATION (CUSTOM PROCEDURE TRAY) ×2 IMPLANT
TRANSDUCER W/STOPCOCK (MISCELLANEOUS) ×2 IMPLANT
TUBING CIL FLEX 10 FLL-RA (TUBING) ×2 IMPLANT
WIRE HI TORQ VERSACORE-J 145CM (WIRE) ×2 IMPLANT

## 2021-03-14 NOTE — CV Procedure (Signed)
    PROCEDURE NOTE:  Procedure:  Transesophageal echocardiogram Operator:  Fransico Him, MD Indications:  Mitral Regurgitation Complications: None  During this procedure the patient is administered a total of Lidocaine 60mg  and Propofol 230 mg to achieve and maintain moderate conscious sedation.  The patient's heart rate, blood pressure, and oxygen saturation are monitored continuously during the procedure by anesthesia.   Results: Normal LV size and function Normal RV size and function Normal RA with prominent eustachion valve Moderately dilated LA with normal LA appendage.  No LA or LAA thrombus. Normal LA emptying velocity at 69cm/s There is moderate spontaneous echo contrast in the LA. Normal TV with trivial TR Normal PV with trivial PR There is mild prolapse of the anterior mitral valve leaflet and partially flail posterior leaflet with severe MR eccentrically directed anteriorly and wraps around the LA into the left upper pulmonary vein.  There is flow systolic reversal in the pulmonary vein.   Trileaflet AV with aortic valve sclerosis with no stenosis. Trivial AR.  Normal interatrial septum with no evidence of shunt by colorflow dopper and agitated saline contrast injection.  Normal thoracic and ascending aorta.  The patient tolerated the procedure well and was transferred back to their room in stable condition.  Signed: Fransico Him, MD [facility]

## 2021-03-14 NOTE — Progress Notes (Addendum)
Pt arrived to [place] [address], [employer], connected to monitor, report received from [place], [place], denies pain, blankets given, safety maintained, call bell given

## 2021-03-14 NOTE — Progress Notes (Signed)
Ambulated to the bathroom to void tol well  

## 2021-03-14 NOTE — Interval H&P Note (Signed)
Cath Lab Visit (complete for each Cath Lab visit)  Clinical Evaluation Leading to the Procedure:   ACS: No.  Non-ACS:    Anginal Classification: No Symptoms  Anti-ischemic medical therapy: No Therapy  Non-Invasive Test Results: No non-invasive testing performed  Prior CABG: No previous CABG      History and Physical Interval Note:  03/14/2021 10:48 AM  [name] [name]  has presented today for surgery, with the diagnosis of MV Regure.  The various methods of treatment have been discussed with the patient and family. After consideration of risks, benefits and other options for treatment, the patient has consented to  Procedure(s): RIGHT/LEFT HEART CATH AND CORONARY ANGIOGRAPHY (N/A) as a surgical intervention.  The patient's history has been reviewed, patient examined, no change in status, stable for surgery.  I have reviewed the patient's chart and labs.  Questions were answered to the patient's satisfaction.     Quay Burow

## 2021-03-14 NOTE — Anesthesia Postprocedure Evaluation (Signed)
Anesthesia Post Note  Patient: [name] [name]  Procedure(s) Performed: TRANSESOPHAGEAL ECHOCARDIOGRAM (TEE) (N/A ) BUBBLE STUDY     Patient location during evaluation: PACU Anesthesia Type: MAC Level of consciousness: awake and alert Pain management: pain level controlled Vital Signs Assessment: post-procedure vital signs reviewed and stable Respiratory status: spontaneous breathing, nonlabored ventilation, respiratory function stable and patient connected to nasal cannula oxygen Cardiovascular status: stable and blood pressure returned to baseline Postop Assessment: no apparent nausea or vomiting Anesthetic complications: no   No complications documented.  Last Vitals:  Vitals:   03/14/21 1025 03/14/21 1043  BP:    Pulse: (!) 44   Resp: 19   Temp:    SpO2: 94% 100%    Last Pain:  Vitals:   03/14/21 1110  TempSrc:   PainSc: 0-No pain                 Cimberly Stoffel

## 2021-03-14 NOTE — Progress Notes (Signed)
  Echocardiogram Echocardiogram Transesophageal has been performed.  [name] [name] 03/14/2021, 9:09 AM

## 2021-03-14 NOTE — Interval H&P Note (Signed)
History and Physical Interval Note:  03/14/2021 7:42 AM  [name] [name]  has presented today for surgery, with the diagnosis of [place].  The various methods of treatment have been discussed with the patient and family. After consideration of risks, benefits and other options for treatment, the patient has consented to  Procedure(s): TRANSESOPHAGEAL ECHOCARDIOGRAM (TEE) (N/A) as a surgical intervention.  The patient's history has been reviewed, patient examined, no change in status, stable for surgery.  I have reviewed the patient's chart and labs.  Questions were answered to the patient's satisfaction.     Fransico Him

## 2021-03-14 NOTE — Progress Notes (Signed)
Pt ambulated without difficulty or bleeding.   Discharged home with husband who will drive and stay with pt x 24 hrs  

## 2021-03-14 NOTE — Progress Notes (Addendum)
Pt received by Marzetta Board RN into the Cath Lab holding area.

## 2021-03-14 NOTE — Transfer of Care (Signed)
Immediate Anesthesia Transfer of Care Note  Patient: [name] [name]  Procedure(s) Performed: TRANSESOPHAGEAL ECHOCARDIOGRAM (TEE) (N/A ) BUBBLE STUDY  Patient Location: Endoscopy Unit  Anesthesia Type:MAC  Level of Consciousness: awake and alert   Airway & Oxygen Therapy: Patient Spontanous Breathing and Patient connected to nasal cannula oxygen  Post-op Assessment: Report given to RN and Post -op Vital signs reviewed and stable  Post vital signs: Reviewed and stable  Last Vitals:  Vitals Value Taken Time  BP 108/46 03/14/21 0844  Temp    Pulse 43 03/14/21 0844  Resp 16 03/14/21 0844  SpO2 98 % 03/14/21 0844  Vitals shown include unvalidated device data.  Last Pain:  Vitals:   03/14/21 0726  TempSrc: Temporal  PainSc: 0-No pain         Complications: No complications documented.

## 2021-03-14 NOTE — Discharge Instructions (Signed)
Drink plenty of fluids for 48 hours and keep wrist elevated at heart level for 24 hours  Radial Site Care   This sheet gives you information about how to care for yourself after your procedure. Your health care provider may also give you more specific instructions. If you have problems or questions, contact your health care provider. What can I expect after the procedure? After the procedure, it is common to have:  Bruising and tenderness at the catheter insertion area. Follow these instructions at home: Medicines  Take over-the-counter and prescription medicines only as told by your health care provider. Insertion site care 1. Follow instructions from your health care provider about how to take care of your insertion site. Make sure you: ? Wash your hands with soap and water before you change your bandage (dressing). If soap and water are not available, use hand sanitizer. ? Remove your dressing as told by your health care provider. In 24 hours 2. Check your insertion site every day for signs of infection. Check for: ? Redness, swelling, or pain. ? Fluid or blood. ? Pus or a bad smell. ? Warmth. 3. Do not take baths, swim, or use a hot tub until your health care provider approves. 4. You may shower 24-48 hours after the procedure, or as directed by your health care provider. ? Remove the dressing and gently wash the site with plain soap and water. ? Pat the area dry with a clean towel. ? Do not rub the site. That could cause bleeding. 5. Do not apply powder or lotion to the site. Activity   1. For 24 hours after the procedure, or as directed by your health care provider: ? Do not flex or bend the affected arm. ? Do not push or pull heavy objects with the affected arm. ? Do not drive yourself home from the hospital or clinic. You may drive 24 hours after the procedure unless your health care provider tells you not to. ? Do not operate machinery or power tools. 2. Do not lift  anything that is heavier than 10 lb (4.5 kg), or the limit that you are told, until your health care provider says that it is safe.  For 4 days 3. Ask your health care provider when it is okay to: ? Return to work or school. ? Resume usual physical activities or sports. ? Resume sexual activity. General instructions  If the catheter site starts to bleed, raise your arm and put firm pressure on the site. If the bleeding does not stop, get help right away. This is a medical emergency.  If you went home on the same day as your procedure, a responsible adult should be with you for the first 24 hours after you arrive home.  Keep all follow-up visits as told by your health care provider. This is important. Contact a health care provider if:  You have a fever.  You have redness, swelling, or yellow drainage around your insertion site. Get help right away if:  You have unusual pain at the radial site.  The catheter insertion area swells very fast.  The insertion area is bleeding, and the bleeding does not stop when you hold steady pressure on the area.  Your arm or hand becomes pale, cool, tingly, or numb. These symptoms may represent a serious problem that is an emergency. Do not wait to see if the symptoms will go away. Get medical help right away. Call your local emergency services (911 in the [place] Do   not drive yourself to the hospital. Summary  After the procedure, it is common to have bruising and tenderness at the site.  Follow instructions from your health care provider about how to take care of your radial site wound. Check the wound every day for signs of infection.  Do not lift anything that is heavier than 10 lb (4.5 kg), or the limit that you are told, until your health care provider says that it is safe. This information is not intended to replace advice given to you by your health care provider. Make sure you discuss any questions you have with your health care  provider. Document Revised: 12/16/2017 Document Reviewed: 12/16/2017 Elsevier Patient Education  2020 [employer]  

## 2021-03-14 NOTE — Anesthesia Procedure Notes (Signed)
Procedure Name: MAC Date/Time: 03/14/2021 8:12 AM Performed by: Imagene Riches, CRNA Pre-anesthesia Checklist: Patient identified, Emergency Drugs available, Suction available, Patient being monitored and Timeout performed Oxygen Delivery Method: Nasal cannula

## 2021-03-15 ENCOUNTER — Institutional Professional Consult (permissible substitution): Payer: PPO | Admitting: Pulmonary Disease

## 2021-03-18 ENCOUNTER — Telehealth: Payer: Self-pay | Admitting: Cardiovascular Disease

## 2021-03-18 NOTE — Telephone Encounter (Signed)
Left message to call back  Cath 4/21 with Dr. 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  She is symptomatic.  She is a great candidate for a "mini mitral" repair.  I am referring her to Dr. Lilly Cove for consideration of this.  The radial sheath was removed and a TR band was placed on the right wrist to achieve patent hemostasis.  The patient left lab in stable condition.  She will be discharged home later today as an outpatient and will follow up with me in the office next week to discuss her options and timing.    Appt scheduled with Dr. Gwenlyn Found 4/27 at 9:30 AM If unable to make this appt will need to keep appt on 4/29 with Arnold Long DNP

## 2021-03-18 NOTE — Telephone Encounter (Signed)
Patient's son stated after her procedure Dr. Gwenlyn Found said he want to see patient in person. Patient has a appt on 4/29 this Friday. Please advise.

## 2021-03-18 NOTE — Telephone Encounter (Signed)
Attempt to call patient again-[id number]

## 2021-03-19 NOTE — Telephone Encounter (Signed)
Patient returned my call.  She has been made aware of her appointment tomorrow with Dr. Gwenlyn Found and is planing to come to this.  I have cancelled the appt with APP on 03/22/21.

## 2021-03-19 NOTE — Telephone Encounter (Signed)
Left messages on home and mobile numbers to call back. ?

## 2021-03-20 ENCOUNTER — Ambulatory Visit: Payer: PPO | Admitting: Cardiovascular Disease

## 2021-03-20 ENCOUNTER — Other Ambulatory Visit: Payer: Self-pay

## 2021-03-20 ENCOUNTER — Encounter: Payer: Self-pay | Admitting: Cardiovascular Disease

## 2021-03-20 VITALS — BP 118/60 | HR 78 | Ht 63.0 in | Wt 132.0 lb

## 2021-03-20 DIAGNOSIS — I340 Nonrheumatic mitral (valve) insufficiency: Secondary | ICD-10-CM

## 2021-03-20 NOTE — Patient Instructions (Signed)
Medication Instructions:  Your physician recommends that you continue on your current medications as directed. Please refer to the Current Medication list given to you today.  *If you need a refill on your cardiac medications before your next appointment, please call your pharmacy*   Follow-Up: At [facility], you and your health needs are our priority.  As part of our continuing mission to provide you with exceptional heart care, we have created designated Provider Care Teams.  These Care Teams include your primary Cardiologist (physician) and Advanced Practice Providers (APPs -  Physician Assistants and Nurse Practitioners) who all work together to provide you with the care you need, when you need it.  We recommend signing up for the patient portal called "MyChart".  Sign up information is provided on this After Visit Summary.  MyChart is used to connect with patients for Virtual Visits (Telemedicine).  Patients are able to view lab/test results, encounter notes, upcoming appointments, etc.  Non-urgent messages can be sent to your provider as well.   To learn more about what you can do with MyChart, go to [email or web address].    Your next appointment:   3 month(s)  The format for your next appointment:   In Person  Provider:   Jonathan Berry, MD 

## 2021-03-20 NOTE — Progress Notes (Signed)
[name] [name] returns today for after having had her TEE and right left heart cath by myself 03/14/2021.  She is accompanied by her Sister Alyson Locket.  Her son [name] is a family medicine physician with [employer].  She had a flail posterior leaflet, normal LV function, clean coronary arteries.  I referred her to Dr. Roxy Manns for consideration of "mini mitral" repair.  I will see her back in 3 months for follow-up.   Lorretta Harp, M.D., [place], [facility], [place]ver[place] [address] [place], [place]  [id number]  [phone] 03/20/2021 9:52 AM

## 2021-03-22 ENCOUNTER — Ambulatory Visit: Payer: PPO | Admitting: Adult Health

## 2021-04-09 ENCOUNTER — Institutional Professional Consult (permissible substitution): Payer: PPO | Admitting: Thoracic Surgery (Cardiothoracic Vascular Surgery)

## 2021-04-09 ENCOUNTER — Encounter: Payer: Self-pay | Admitting: Thoracic Surgery (Cardiothoracic Vascular Surgery)

## 2021-04-09 ENCOUNTER — Other Ambulatory Visit: Payer: Self-pay

## 2021-04-09 ENCOUNTER — Other Ambulatory Visit: Payer: Self-pay | Admitting: *Deleted

## 2021-04-09 VITALS — BP 131/61 | HR 61 | Resp 20 | Ht 63.0 in | Wt 134.0 lb

## 2021-04-09 DIAGNOSIS — I340 Nonrheumatic mitral (valve) insufficiency: Secondary | ICD-10-CM

## 2021-04-09 NOTE — Progress Notes (Addendum)
[address]       [place] [address]             [phone]     CARDIOTHORACIC SURGERY CONSULTATION REPORT  Referring Provider is Lorretta Harp, MD PCP is Binnie Rail, MD  Chief Complaint  Patient presents with  . Mitral Regurgitation    Initial surgical consult, ECHO 4/21, cath 4/21    HPI:  Patient is 75 year old female with history of hypertension, hyperlipidemia, degenerative arthritis, PVCs, migraine headaches, irritable bowel syndrome, GE reflux disease, and hiatal hernia who has been referred for surgical consultation to discuss treatment options for recently discovered mitral valve prolapse with severe mitral regurgitation.  Patient denies any known history of heart murmur until recently.  She has been evaluated in the past for symptoms of atypical chest pain and palpitations.  Over the past 2 years she has developed progressive symptoms of exertional shortness of breath and fatigue.  She was recently noted to have a systolic murmur on physical exam by her son who is a primary care physician.  She was referred to Dr. Gwenlyn Found for cardiology consultation and transthoracic echocardiogram performed February 27, 2021 revealed mitral valve prolapse with at least moderate and possibly severe mitral regurgitation.  Left ventricular systolic function was reported to be normal with ejection fraction estimated 55 to 60%.  There was moderate left atrial enlargement.  The patient subsequently underwent transesophageal echocardiogram March 14, 2021 which confirmed the presence of mitral valve prolapse and severe mitral regurgitation.  There was systolic flow reversal in the pulmonary veins.  There was moderate left atrial enlargement and normal left ventricular function with ejection fraction estimated 60 to 65%.  Diagnostic cardiac catheterization was performed by Dr. Gwenlyn Found and revealed normal coronary artery anatomy with no significant coronary artery disease.   Pulmonary artery pressures were mild to moderately elevated.  Cardiothoracic surgical consultation was requested.  Patient is married and lives locally in [place] with her husband.  She is originally from [place] but has lived in the [place] for more than 30 years.  She is a retired Radio producer.  She remained reasonably active physically although she admits that she does not walk as much as she used to in the past because of degenerative arthritis in both knees.  More recently she has been limited primarily by shortness of breath.  Symptoms began approximately 2 years ago and have progressed.  She now intermittently gets short of breath with low-level activity but she denies resting shortness of breath.  She denies any history of orthopnea but she has had occasional episodes of PND.  She reports occasional tachypalpitations without dizziness or syncope.  She has not had any exertional chest pain or chest tightness.  She denies any history of lower extremity edema.  Shortness of breath is exacerbated by lying on her left side.  Past Medical History:  Diagnosis Date  . Arthritis   . Atypical chest pain   . Diverticulosis   . Gastritis 11/08/2007, 04/12/2012   H pylori bx neg 03/2012  . GERD (gastroesophageal reflux disease) 11/08/2007, 04/12/2012  . Hepatic cyst   . Hiatal hernia 11/08/2007, 04/12/2012  . History of kidney stones   . Hyperlipidemia   . Hypertension   . IBS (irritable bowel syndrome)    diarrhea predom  . Migraine   . Mitral regurgitation   . Osteoporosis    DEXA 01/21/2012: -2.1 spine and R fem, -1.8 L fem s/p fosamax  2004-2011 DEXA 04/18/14 @ [facility]: -2.5 - rec to start Prolia    . PVC's (premature ventricular contractions)   . 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 Shingles outbreak 04/2013    Past Surgical History:  Procedure Laterality Date  . APPENDECTOMY  1958  . BUBBLE STUDY  03/14/2021   Procedure: BUBBLE STUDY;  Surgeon: Sueanne Margarita, MD;  Location: [facility] ENDOSCOPY;  Service: Cardiovascular;;  . COLONOSCOPY    . CYSTOCELE REPAIR  2008  . ESOPHAGOGASTRODUODENOSCOPY    . Maxilofacial  1992  . RIGHT/LEFT HEART CATH AND CORONARY ANGIOGRAPHY N/A 03/14/2021   Procedure: RIGHT/LEFT HEART CATH AND CORONARY ANGIOGRAPHY;  Surgeon: Lorretta Harp, MD;  Location: [facility];  Service: Cardiovascular;  Laterality: N/A;  . TEE WITHOUT CARDIOVERSION N/A 03/14/2021   Procedure: TRANSESOPHAGEAL ECHOCARDIOGRAM (TEE);  Surgeon: Sueanne Margarita, MD;  Location: [facility] ENDOSCOPY;  Service: Cardiovascular;  Laterality: N/A;  . TONSILLECTOMY  1960    Family History  Problem Relation Age of Onset  . Hypertension Mother   . Alzheimer's disease Mother 58  . AAA (abdominal aortic aneurysm) Mother   . Stroke Father 40  . Kidney cancer Brother        mets  . Bone cancer Brother 51  . Colon cancer Neg Hx     Social History   Socioeconomic History  . Marital status: Married    Spouse name: [name]  . Number of children: 3  . Years of education: Not on file  . Highest education level: Master's degree (e.g., MA, MS, MEng, MEd, MSW, MBA)  Occupational History  . Occupation: retired  Tobacco Use  . Smoking status: Never Smoker  . Smokeless tobacco: Never Used  Vaping Use  . Vaping Use: Never used  Substance and Sexual Activity  . Alcohol use: No  . Drug use: No  . Sexual activity: Not on file  Other Topics Concern  . Not on file  Social History Narrative   Married, lives with spouse. Retired Licensed conveyancer, Print production planner. Has 3 kids (moved to [place] to be closer)- youngest son MD. Dorie Rank to [place] from [place] 06/2010, lived in [place] x 10years   Social Determinants of Health   Financial Resource Strain: [place]   . Difficulty of Paying Living Expenses: Not hard at all  Food Insecurity: Not on file  Transportation Needs: Not on file  Physical Activity: Not on file  Stress: Not on file  Social Connections: Not on file   Intimate Partner Violence: Not on file    Current Outpatient Medications  Medication Sig Dispense Refill  . atorvastatin (LIPITOR) 40 MG tablet Take 1/2 (one-half) tablet by mouth once daily (Patient taking differently: Take 20 mg by mouth daily.) 45 tablet 2  . butalbital-acetaminophen-caffeine (FIORICET) 50-325-40 MG tablet TAKE 1 TABLET BY MOUTH EVERY 4 HOURS AS NEEDED FOR HEADACHE (Patient taking differently: Take 1 tablet by mouth every 4 (four) hours as needed for headache or migraine.) 30 tablet 0  . calcium carbonate (OS-CAL) 600 MG tablet Take 1 tablet (600 mg total) by mouth daily. 60 tablet   . Cholecalciferol (VITAMIN D3) 1000 units CAPS Take 1,000 Units by mouth daily.    . cyanocobalamin 1000 MCG tablet Take 1,000 mcg by mouth daily.    . furosemide (LASIX) 20 MG tablet Take 2 tablets (40 mg total) by mouth daily. (Patient taking differently: Take 20 mg by mouth 2 (two) times daily.) 60 tablet 2  . gabapentin (  NEURONTIN) 100 MG capsule Take 1 capsule (100 mg total) by mouth at bedtime.    Marland Kitchen losartan (COZAAR) 100 MG tablet Take 0.5 tablets (50 mg total) by mouth daily. 90 tablet 0  . metoprolol succinate (TOPROL-XL) 50 MG 24 hr tablet Take 1/2 tablet twice daily. Take with or immediately following a meal. 90 tablet 1  . Peppermint Oil (IBGARD) 90 MG CPCR Take as directed (Patient taking differently: Take 90 mg by mouth daily before lunch. Take as directed) 16 capsule 0  . potassium chloride SA (KLOR-CON) 20 MEQ tablet 1/2 tablet daily    . aspirin EC 81 MG tablet Take 81 mg by mouth daily. Swallow whole. (Patient not taking: Reported on 04/09/2021)     No current facility-administered medications for this visit.    No Known Allergies    Review of Systems:   General:  normal appetite, decreased energy, no weight gain, no weight loss, no fever  Cardiac:  no chest pain with exertion, no chest pain at rest, +SOB with exertion, no resting SOB, + PND, no orthopnea, + palpitations,  no arrhythmia, no atrial fibrillation, no LE edema, no dizzy spells, no syncope  Respiratory:  + exertional shortness of breath, no home oxygen, no productive cough, + chronic dry cough, no bronchitis, no wheezing, no hemoptysis, no asthma, no pain with inspiration or cough, no sleep apnea, no CPAP at night  GI:   no difficulty swallowing, + reflux, no frequent heartburn, no hiatal hernia, no abdominal pain, no constipation, + diarrhea, no hematochezia, no hematemesis, no melena  GU:   no dysuria,  no frequency, no urinary tract infection, no hematuria, no kidney stones, no kidney disease  Vascular:  no pain suggestive of claudication, no pain in feet, no leg cramps, no varicose veins, no DVT, no non-healing foot ulcer  Neuro:   no stroke, no TIA's, no seizures, + headaches, no temporary blindness one eye,  no slurred speech, no peripheral neuropathy, no chronic pain, no instability of gait, no memory/cognitive dysfunction  Musculoskeletal: + arthritis, no joint swelling, no myalgias, minor difficulty walking, normal mobility   Skin:   no rash, no itching, no skin infections, no pressure sores or ulcerations  Psych:   no anxiety, no depression, no nervousness, no unusual recent stress  Eyes:   no blurry vision, no floaters, no recent vision changes, + wears glasses or contacts  ENT:   no hearing loss, no loose or painful teeth, no dentures, last saw dentist within the past year  Hematologic:  no easy bruising, no abnormal bleeding, no clotting disorder, no frequent epistaxis  Endocrine:  no diabetes, does not check CBG's at home     Physical Exam:   BP 131/61 (BP Location: Left Arm, Patient Position: Sitting)   Pulse 61   Resp 20   Ht 5\' 3"  (1.6 m)   Wt 134 lb (60.8 kg)   SpO2 94% Comment: RA  BMI 23.74 kg/m   General:    well-appearing  HEENT:  Unremarkable   Neck:   no JVD, no bruits, no adenopathy   Chest:   clear to auscultation, symmetrical breath sounds, no wheezes, no rhonchi    CV:   RRR, grade IV/VI holosystolic murmur heard all across the precordium  Abdomen:  soft, non-tender, no masses   Extremities:  warm, well-perfused, pulses palpable, no LE edema  Rectal/GU  Deferred  Neuro:   Grossly non-focal and symmetrical throughout  Skin:   Clean and dry, no rashes, no  breakdown   Diagnostic Tests:   ECHOCARDIOGRAM REPORT       Patient Name:  SOPHRONIA HERRADA Date of Exam: 02/27/2021  Medical Rec #: [id number]    Height:    63.0 in  Accession #:  [id number]    Weight:    136.0 lb  Date of Birth: 10-09-1946    BSA:     1.641 m  Patient Age:  18 years     BP:      106/70 mmHg  Patient Gender: F        HR:      69 bpm.  Exam Location: [facility]   Procedure: 2D Echo, Cardiac Doppler and Color Doppler   Indications:  I51.7 Cardiomegaly; R01.1 Murmur; R06.00 Dyspnea;    History:    Patient has no prior history of Echocardiogram  examinations.         Signs/Symptoms:Chest Pain; Risk Factors:Hypertension and         Dyslipidemia. Prediabetes. Heart failure.    Sonographer:  Diamond Nickel RCS  Referring Phys: [id number] [place]    1. Left ventricular ejection fraction, by estimation, is 55 to 60%. The  left ventricle has normal function. The left ventricle has no regional  wall motion abnormalities. Left ventricular diastolic parameters were  normal.  2. Right ventricular systolic function is normal. The right ventricular  size is normal.  3. Left atrial size was moderately dilated.  4. There appears to be a partially flail posterior mitral valve leaflet  with significant turburlent blood flow in the left atrium with at least  moderate MR and may be severe.The mitral valve is abnormal. Moderate  mitral valve regurgitation. No evidence  of mitral stenosis.  5. The aortic valve is normal in structure. Aortic valve regurgitation is  not  visualized. Mild aortic valve sclerosis is present, with no evidence  of aortic valve stenosis.  6. The inferior vena cava is normal in size with greater than 50%  respiratory variability, suggesting right atrial pressure of 3 mmHg.R   Recommend TEE for further evaluation of the mitral valve.  FINDINGS  Left Ventricle: Left ventricular ejection fraction, by estimation, is 55  to 60%. The left ventricle has normal function. The left ventricle has no  regional wall motion abnormalities. The left ventricular internal cavity  size was normal in size. There is  no left ventricular hypertrophy. Left ventricular diastolic parameters  were normal. Normal left ventricular filling pressure.   Right Ventricle: The right ventricular size is normal. No increase in  right ventricular wall thickness. Right ventricular systolic function is  normal.   Left Atrium: Left atrial size was moderately dilated.   Right Atrium: Right atrial size was normal in size.   Pericardium: There is no evidence of pericardial effusion.   Mitral Valve: There appears to be a partially flail posterior mitral valve  leaflet with significant turburlent blood flow in the left atrium with at  least moderate MR and may be severe. The mitral valve is abnormal.  Moderate mitral valve regurgitation,  with eccentric anteriorly directed jet. No evidence of mitral valve  stenosis.   Tricuspid Valve: The tricuspid valve is normal in structure. Tricuspid  valve regurgitation is trivial. No evidence of tricuspid stenosis.   Aortic Valve: The aortic valve is normal in structure. Aortic valve  regurgitation is not visualized. Mild aortic valve sclerosis is present,  with no evidence of aortic valve stenosis.   Pulmonic Valve: The pulmonic valve  was normal in structure. Pulmonic valve  regurgitation is not visualized. No evidence of pulmonic stenosis.   Aorta: The aortic root is normal in size and structure.   Venous: The  inferior vena cava is normal in size with greater than 50%  respiratory variability, suggesting right atrial pressure of 3 mmHg.   IAS/Shunts: No atrial level shunt detected by color flow Doppler.     LEFT VENTRICLE  PLAX 2D  LVIDd:     5.00 cm Diastology  LVIDs:     3.00 cm LV e' medial:  8.16 cm/s  LV PW:     1.00 cm LV E/e' medial: 15.0  LV IVS:    0.70 cm LV e' lateral:  10.10 cm/s  LVOT diam:   1.75 cm LV E/e' lateral: 12.1  LV SV:     42  LV SV Index:  26  LVOT Area:   2.41 cm     RIGHT VENTRICLE  RV Basal diam: 3.30 cm  RV S prime:   11.70 cm/s  TAPSE (M-mode): 1.9 cm   LEFT ATRIUM       Index    RIGHT ATRIUM      Index  LA diam:    4.10 cm 2.50 cm/m RA Area:   16.10 cm  LA Vol (A2C):  89.6 ml 54.59 ml/m RA Volume:  42.60 ml 25.95 ml/m  LA Vol (A4C):  46.0 ml 28.03 ml/m  LA Biplane Vol: 66.7 ml 40.64 ml/m  AORTIC VALVE  LVOT Vmax:  94.00 cm/s  LVOT Vmean: 51.400 cm/s  LVOT VTI:  0.175 m    AORTA  Ao Root diam: 2.50 cm   MITRAL VALVE  MV Area (PHT): 3.66 cm   SHUNTS  MV Decel Time: 207 msec   Systemic VTI: 0.18 m  MV E velocity: 122.00 cm/s Systemic Diam: 1.75 cm  MV A velocity: 90.00 cm/s  MV E/A ratio: 1.36   Fransico Him MD  Electronically signed by Fransico Him MD  Signature Date/Time: 02/27/2021/9:38:09 AM      TRANSESOPHOGEAL ECHO REPORT       Patient Name:  [name] Date of Exam: 03/14/2021  Medical Rec #: [id number]    Height:    63.0 in  Accession #:  [id number]    Weight:    134.4 lb  Date of Birth: 11-24-1946    BSA:     1.633 m  Patient Age:  25 years     BP:      138/72 mmHg  Patient Gender: F        HR:      96 bpm.  Exam Location: Inpatient   Procedure: 3D Echo, Transesophageal Echo, Cardiac Doppler, Color Doppler  and       Saline Contrast Bubble Study   Indications:   I34.0  Nonrheumatic mitral (valve) insufficiency    History:     Patient has prior history of Echocardiogram examinations,  most          recent 02/27/2021. Cardiomegaly and CHF, Abnormal ECG,  Mitral          Valve Disease; Signs/Symptoms:Chest Pain. Mitral valve          regurgitation.    Sonographer:   Roseanna Rainbow RDCS  Referring Phys: [id number] TRACI R TURNER  Diagnosing Phys: Fransico Him MD   PROCEDURE: After discussion of the risks and benefits of a TEE, an  informed consent was obtained from the patient. The transesophogeal probe  was passed without difficulty through  the esophogus of the patient. Imaged  were obtained with the patient in a  left lateral decubitus position. Sedation performed by different  physician. The patient was monitored while under deep sedation.  Anesthestetic sedation was provided intravenously by Anesthesiology: 220mg   of Propofol, 60mg  of Lidocaine. Image quality was  excellent. The patient's vital signs; including heart rate, blood  pressure, and oxygen saturation; remained stable throughout the procedure.  The patient developed no complications during the procedure.   IMPRESSIONS    1. Left ventricular ejection fraction, by estimation, is 60 to 65%. The  left ventricle has normal function. The left ventricle has no regional  wall motion abnormalities.  2. Right ventricular systolic function is normal. The right ventricular  size is normal.  3. Left atrial size was moderately dilated. No left atrial/left atrial  appendage thrombus was detected. The LAA emptying velocity was 69 cm/s.  4. There is mild prolapse of the anterior mitral valve leaflet and  partially flail posterior leaflet with severe MR eccentrically directed  anteriorly and wraps around the LA into the left upper pulmonary vein.  There is systolic flow reversal in the  pulmonary vein. . The mitral valve is normal in structure. Severe mitral  valve  regurgitation. No evidence of mitral stenosis.  5. The aortic valve is normal in structure. Aortic valve regurgitation is  not visualized. No aortic stenosis is present.   Conclusion(s)/Recommendation(s): Normal biventricular function with  partially flail posterior MV leaflet with severe MR  FINDINGS  Left Ventricle: Left ventricular ejection fraction, by estimation, is 60  to 65%. The left ventricle has normal function. The left ventricle has no  regional wall motion abnormalities. The left ventricular internal cavity  size was normal in size. There is  no left ventricular hypertrophy.   Right Ventricle: The right ventricular size is normal. No increase in  right ventricular wall thickness. Right ventricular systolic function is  normal.   Left Atrium: Left atrial size was moderately dilated. Spontaneous echo  contrast was present in the left atrium. No left atrial/left atrial  appendage thrombus was detected. The LAA emptying velocity was 69 cm/s.   Right Atrium: Right atrial size was normal in size. Prominent Eustachian  valve.   Pericardium: There is no evidence of pericardial effusion.   Mitral Valve: There is mild prolapse of the anterior mitral valve leaflet  and partially flail posterior leaflet with severe MR eccentrically  directed anteriorly and wraps around the LA into the left upper pulmonary  vein. There is systolic flow reversal  in the pulmonary vein. The mitral valve is normal in structure. Severe  mitral valve regurgitation, with eccentric anteriorly directed jet. No  evidence of mitral valve stenosis.   Tricuspid Valve: The tricuspid valve is normal in structure. Tricuspid  valve regurgitation is trivial. No evidence of tricuspid stenosis.   Aortic Valve: The aortic valve is normal in structure. Aortic valve  regurgitation is not visualized. No aortic stenosis is present.   Pulmonic Valve: The pulmonic valve was normal in structure. Pulmonic valve   regurgitation is trivial. No evidence of pulmonic stenosis.   Aorta: The aortic root is normal in size and structure.   IAS/Shunts: No atrial level shunt detected by color flow Doppler. Agitated  saline contrast was given intravenously to evaluate for intracardiac  shunting.   Fransico Him MD  Electronically signed by Fransico Him MD  Signature Date/Time: 03/19/2021/4:47:41 PM       RIGHT/LEFT HEART CATH AND CORONARY ANGIOGRAPHY  Conclusion    Hemodynamic findings consistent with mitral valve regurgitation.   Brilee Tindel is a 75 y.o. female    [id number] LOCATION:  FACILITY: [facility]  PHYSICIAN: Quay Burow, M.D. 1946-04-16   DATE OF PROCEDURE:  03/14/2021  DATE OF DISCHARGE:     CARDIAC CATHETERIZATION     History obtained from chart review. Ms Tetrault is a 75 year old married Caucasian female mother of 3 children, grandmother to 2 grandchildren,patient Dr. April Holding Dr. Croitoruremotely.She is accompanied by her husband [name] today. She was referred for atypical chest pain. I last saw her in the office 02/09/2015. Her cardiac risk factor profile is notable for treated hypertension and mild hyperlipidemia. She has never had a heart attack or stroke. She is otherwise healthy except for GERD. She doesn't saw Dr. Sallyanne Kuster back in 2011 for atypical chest pain and workup was negative including a Myoview stress test. She was recently out of the country for several months and returned one month ago. Since that time she's had daily chest pain. She was under a lot of stress and she was away the pain itself sounds like GERD, begin subxiphoid and has no other characteristic symptoms of angina.  Since I saw her 2 years ago she is complained of increasing dyspnea on exertion.  She did have a negative Myoview stress test.  Recent 2D echo revealed severe MR.  She presents now for same-day transesophageal echo followed by right and left heart  cath (radial/brachial) in anticipation of "mini mitral" repair.   IMPRESSION: Ms. Cregg has a flail posterior leaflet with severe MR, clean coronary arteries with normal LV function and a elevated V wave.  She is symptomatic.  She is a great candidate for a "mini mitral" repair.  I am referring her to Dr. Lilly Cove for consideration of this.  The radial sheath was removed and a TR band was placed on the right wrist to achieve patent hemostasis.  The patient left lab in stable condition.  She will be discharged home later today as an outpatient and will follow up with me in the office next week to discuss her options and timing.  Quay Burow. MD, [facility] 03/14/2021 11:32 AM      Surgeon Notes    03/14/2021 8:42 AM CV Procedure signed by Sueanne Margarita, MD    Indications  Severe mitral regurgitation [I34.0 (ICD-10-CM)]   Procedural Details  Technical Details PROCEDURE DESCRIPTION:   The patient was brought to the second floor [facility] cath lab in the postabsorptive state.  She was notpremedicated .  Her right wrist and antecubital fossaWere prepped and shaved in usual sterile fashion. Xylocaine 1% was used for local anesthesia. A 6 French sheath was inserted into the right radial artery using standard Seldinger technique.  A 7 French sheath was inserted into the right antecubital vein.  A 7 [place] balloontipped fibrillation Swan-Ganz catheter was then advanced through the right heart chambers obtain sequential pressures and pulmonary blood samples for the determination of Fick cardiac output.  5 [place] TIG catheter and pigtail catheters were used for selective coronary angiography and obtaining left heart pressures.  Isovue dye was used for the entirety of the case.  Retrograde aortic, ventricular and pullback pressures were recorded.  The patient did receive 3000 units of IV heparin.  Radial cocktail was administered through the SideArm sheath. Estimated blood loss <50  mL.   During this procedure no sedation was administered.   Medications (Filter: Administrations occurring from 1030 to 1127  on 03/14/21)  Heparin (Porcine) in NaCl 1000-0.9 UT/500ML-% SOLN (mL) Total volume:  1,500 mL  Date/Time Rate/Dose/Volume Action   03/14/21 1044 500 mL Given   1044 500 mL Given   1044 500 mL Given    lidocaine (PF) (XYLOCAINE) 1 % injection (mL) Total volume:  3 mL  Date/Time Rate/Dose/Volume Action   03/14/21 1103 1 mL Given   1103 2 mL Given    heparin sodium (porcine) injection (Units) Total dose:  3,000 Units  Date/Time Rate/Dose/Volume Action   03/14/21 1111 3,000 Units Given    Radial Cocktail (Verapamil 2.5 mg, NTG, Lidocaine) (mL) Total volume:  5 mL  Date/Time Rate/Dose/Volume Action   03/14/21 1109 5 mL Given    iohexol (OMNIPAQUE) 350 MG/ML injection (mL) Total volume:  70 mL  Date/Time Rate/Dose/Volume Action   03/14/21 1121 70 mL Given     Contrast  Medication Name Total Dose  iohexol (OMNIPAQUE) 350 MG/ML injection 70 mL    Radiation/Fluoro  Fluoro time: 3 (min) DAP: 8.1 (Gycm2) Cumulative Air Kerma: 5.6 (mGy)   Coronary Findings   Diagnostic Dominance: Right  No diagnostic findings have been documented.  Intervention   No interventions have been documented.  Right Heart  Right Heart Pressures Hemodynamic findings consistent with mitral valve regurgitation. 1: Right atrial pressure-9/3 2: Right ventricular pressure- 46/0 3: Pulmonary artery pressure-44/15, mean 27 4: Pulmonary wedge pressure- 20/30, mean 19 5: LVEDP-11 6: Cardiac output 5.5 L/min with an index of 3.4 L/min/m   Coronary Diagrams   Diagnostic Dominance: Right    Intervention    Implants    No implant documentation for this case.    Syngo Images  Show images for CARDIAC CATHETERIZATION  Images on Long Term Storage  Show images for Donise, Mauzey "Jana Half L"  Link to Procedure Log  Procedure Log     Hemo  Data  Flowsheet Row Most Recent Value  Fick Cardiac Output 5.54 L/min  Fick Cardiac Output Index 3.39 (L/min)/BSA  RA A Wave 9 mmHg  RA V Wave 3 mmHg  RA Mean 3 mmHg  RV Systolic Pressure 46 mmHg  RV Diastolic Pressure 0 mmHg  RV EDP 4 mmHg  PA Systolic Pressure 44 mmHg  PA Diastolic Pressure 15 mmHg  PA Mean 27 mmHg  PW A Wave 20 mmHg  PW V Wave 30 mmHg  PW Mean 19 mmHg  AO Systolic Pressure [id number] mmHg  AO Diastolic Pressure 66 mmHg  AO Mean 92 mmHg  LV Systolic Pressure 0000000 mmHg  LV Diastolic Pressure 5 mmHg  LV EDP 13 mmHg  AOp Systolic Pressure 123456 mmHg  AOp Diastolic Pressure 68 mmHg  AOp Mean Pressure 94 mmHg  LVp Systolic Pressure [id number] mmHg  LVp Diastolic Pressure 3 mmHg  LVp EDP Pressure 10 mmHg  QP/QS 1  TPVR Index 7.96 HRUI  TSVR Index 27.13 HRUI  PVR SVR Ratio 0.09  TPVR/TSVR Ratio 0.29      Impression:  Patient has mitral valve prolapse with stage D severe symptomatic primary mitral regurgitation.  She describes a 2-year history of progressive symptoms of exertional shortness of breath and fatigue consistent with chronic diastolic congestive heart failure, New York Heart Association functional class IIb bordering on class III.  I have personally reviewed the patient's recent echocardiograms and diagnostic cardiac catheterization.  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  Left ventricular systolic function appears reasonably well-preserved.  There is moderate left atrial enlargement.  Diagnostic cardiac catheterization is notable of the absence of significant coronary artery disease and reveals moderate pulmonary hypertension.  I agree the patient needs mitral valve repair.  Risks associated with conventional surgery will be slightly  elevated only because of the patient's age.   Plan:  The patient and her son and husband were all counseled at length regarding her diagnosis of severe primary mitral regurgitation.  We reviewed the results of their diagnostic tests including images from the most recent echocardiogram.  We discussed the natural history of mitral regurgitation as well as alternative treatment strategies.  We discussed the impact of her age, current state of health, and any significant comorbid medical problems on clinical decision making.  We went on to discuss the indications, risks and potential benefits of mitral valve repair as well as the timing of surgical intervention.  The rationale for elective surgery has been explained, including a comparison between surgery and continued medical therapy with close follow-up.  The likelihood of successful and durable mitral valve repair has been discussed with particular reference to the findings of the most recent echocardiogram.  Based upon these findings and previous experience, I have quoted a greater than 98 percent likelihood of successful valve repair with less than 2 percent risk of mortality or major morbidity.  Alternative  surgical approaches have been discussed including a comparison between conventional sternotomy and minimally-invasive techniques.  Transcatheter edge-to-edge repair using Mitraclip was discussed as an alternative to conventional surgery that is typically reserved for patients who are felt to be at relatively high risk.  The relative risks and benefits of each have been reviewed as they pertain to the patient's specific circumstances, and expectations for the patient's postoperative convalescence has been discussed.  The patient desires to proceed with elective mitral valve repair in the near future.  She understands that I will be leaving [facility] in July and have limited remaining availability for scheduling surgery.  She also understands that I may  not be available to manage her throughout the entirety of her postoperative recovery.  Referral to a tertiary care center for a second opinion has been offered.  We tentatively plan to proceed with elective mitral valve repair and possible clipping of left atrial appendage via right mini thoracotomy approach on May 21, 2021.  She will return for follow-up prior to surgery on May 17, 2021.  The patient has been instructed to call and return sooner if she begins to experience worsening shortness of breath.  All questions have been answered.    I spent in excess of 90 minutes during the conduct of this office consultation and >50% of this time involved direct face-to-face encounter with the patient for counseling and/or coordination of their care.    Valentina Gu. Roxy Manns, MD 04/09/2021 4:07 PM

## 2021-04-09 NOTE — Patient Instructions (Addendum)
Stop taking aspirin 1 week prior to surgery  Continue taking all other medications without change through the day before surgery.  Make sure to bring all of your medications with you when you come for your Pre-Admission Testing appointment at [facility] Short-Stay Department.  Have nothing to eat or drink after midnight the night before surgery.  On the morning of surgery do not take any medications  At your appointment for Pre-Admission Testing at the [facility] Short-Stay Department you will be asked to sign permission forms for your upcoming surgery.  By definition your signature on these forms implies that you and/or your designee provide full informed consent for your planned surgical procedure(s), that alternative treatment options have been discussed, that you understand and accept any and all potential risks, and that you have some understanding of what to expect for your post-operative convalescence.  For elective mitral valve repair or replacement potential operative risks include but are not limited to at least some risk of death, stroke or other neurologic complication, myocardial infarction, congestive heart failure, respiratory failure, renal failure, bleeding requiring transfusion and/or reexploration, arrhythmia, heart block or bradycardia requiring permanent pacemaker insertion, infection or other wound complications, pneumonia, pleural and/or pericardial effusion, pulmonary embolus, aortic dissection or other major vascular complication, or other immediate or delayed complications related to valve repair or replacement including but not limited to recurrent or persistent mitral regurgitation and/or mitral stenosis, LV outflow tract obstruction, aortic insufficiency, paravalvular leak, posterior AV groove disruption, late structural valve deterioration and failure, thrombosis, embolization, or endocarditis.  Specific risks potentially related to the  minimally-invasive approach include but are not limited to risk of conversion to full or partial sternotomy, aortic dissection or other major vascular complication, unilateral acute lung injury or pulmonary edema, phrenic nerve dysfunction or paralysis, rib fracture, chronic pain, lung hernia, or lymphocele.  Please call to schedule a follow-up appointment in our office prior to surgery if you have any unresolved questions about your planned surgical procedure, the associated risks, alternative treatment options, and/or expectations for your post-operative recovery.

## 2021-04-10 ENCOUNTER — Ambulatory Visit: Payer: PPO | Admitting: Pulmonary Disease

## 2021-04-10 ENCOUNTER — Encounter: Payer: Self-pay | Admitting: Pulmonary Disease

## 2021-04-10 ENCOUNTER — Encounter: Payer: Self-pay | Admitting: *Deleted

## 2021-04-10 ENCOUNTER — Other Ambulatory Visit: Payer: Self-pay | Admitting: *Deleted

## 2021-04-10 VITALS — BP 102/58 | HR 38 | Temp 97.8°F | Ht 63.0 in | Wt 134.8 lb

## 2021-04-10 DIAGNOSIS — J90 Pleural effusion, not elsewhere classified: Secondary | ICD-10-CM

## 2021-04-10 DIAGNOSIS — I340 Nonrheumatic mitral (valve) insufficiency: Secondary | ICD-10-CM

## 2021-04-10 NOTE — Progress Notes (Signed)
[name] [name]    [id number]    1946-01-30  Primary Care Physician:Burns, Claudina Lick, MD  Referring Physician: Binnie Rail, MD [place],  [place] [address]  Chief complaint: Consult for abnormal CT  HPI: 75 year old with history of hypertension, irritable bowel syndrome, reflux disease, hiatal hernia, recent diagnosis of mitral insufficiency.  Complains of dyspnea on exertion for the past few years.  She has nonproductive cough.  Denies any fevers or chills.  She has joint pain attributed to arthritis, occasional difficulty swallowing.  Denies any rash, Raynaud's syndrome, dry mouth or dry eyes.  Evaluated in the ED in March 2022 for dyspnea.  She had a CT scan which showed bilateral effusions and groundglass opacities.  She is given Lasix and doxycycline.  Tested negative for COVID.  She was subsequently diagnosed with severe mitral regurgitation.  Underwent right heart cath by cardiology and evaluated by cardiothoracic surgery.  Mitral valve repair is planned in June 2022.  Pets: No pets Occupation: Retired Radio producer Exposures: No exposures.  No mold, hot tub, Jacuzzi.  No feather pillows or comforter Smoking history: Never smoker Travel history: Immigrated from [place] in 1995.  Previously lived in [place].  No significant recent travel Relevant family history: No family history of lung disease  Outpatient Encounter Medications as of 04/10/2021  Medication Sig  . atorvastatin (LIPITOR) 40 MG tablet Take 1/2 (one-half) tablet by mouth once daily (Patient taking differently: Take 20 mg by mouth daily.)  . butalbital-acetaminophen-caffeine (FIORICET) 50-325-40 MG tablet TAKE 1 TABLET BY MOUTH EVERY 4 HOURS AS NEEDED FOR HEADACHE (Patient taking differently: Take 1 tablet by mouth every 4 (four) hours as needed for headache or migraine.)  . calcium carbonate (OS-CAL) 600 MG tablet Take 1 tablet (600 mg total) by mouth daily.  . Cholecalciferol (VITAMIN  D3) 1000 units CAPS Take 1,000 Units by mouth daily.  . cyanocobalamin 1000 MCG tablet Take 1,000 mcg by mouth daily.  . furosemide (LASIX) 20 MG tablet Take 20 mg by mouth daily.  Marland Kitchen gabapentin (NEURONTIN) 100 MG capsule Take 1 capsule (100 mg total) by mouth at bedtime.  Marland Kitchen losartan (COZAAR) 100 MG tablet Take 0.5 tablets (50 mg total) by mouth daily.  . metoprolol succinate (TOPROL-XL) 50 MG 24 hr tablet Take 1/2 tablet twice daily. Take with or immediately following a meal.  . Peppermint Oil (IBGARD) 90 MG CPCR Take as directed (Patient taking differently: Take 90 mg by mouth daily before lunch. Take as directed)  . potassium chloride SA (KLOR-CON) 20 MEQ tablet 1/2 tablet daily  . [DISCONTINUED] furosemide (LASIX) 20 MG tablet Take 2 tablets (40 mg total) by mouth daily. (Patient taking differently: Take 20 mg by mouth daily.)  . [DISCONTINUED] aspirin EC 81 MG tablet Take 81 mg by mouth daily. Swallow whole. (Patient not taking: Reported on 04/09/2021)   No facility-administered encounter medications on file as of 04/10/2021.    Allergies as of 04/10/2021  . (No Known Allergies)    Past Medical History:  Diagnosis Date  . Arthritis   . Atypical chest pain   . Diverticulosis   . Gastritis 11/08/2007, 04/12/2012   H pylori bx neg 03/2012  . GERD (gastroesophageal reflux disease) 11/08/2007, 04/12/2012  . Hepatic cyst   . Hiatal hernia 11/08/2007, 04/12/2012  . History of kidney stones   . Hyperlipidemia   . Hypertension   . IBS (irritable bowel syndrome)    diarrhea predom  . Migraine   .  Mitral regurgitation   . Osteoporosis    DEXA 01/21/2012: -2.1 spine and R fem, -1.8 L fem s/p fosamax  2004-2011 DEXA 04/18/14 @ [facility]: -2.5 - rec to start Prolia    . PVC's (premature ventricular contractions)   . 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 Shingles outbreak 04/2013    Past Surgical History:  Procedure Laterality Date  . APPENDECTOMY  1958  . BUBBLE  STUDY  03/14/2021   Procedure: BUBBLE STUDY;  Surgeon: Sueanne Margarita, MD;  Location: [facility] ENDOSCOPY;  Service: Cardiovascular;;  . COLONOSCOPY    . CYSTOCELE REPAIR  2008  . ESOPHAGOGASTRODUODENOSCOPY    . Maxilofacial  1992  . RIGHT/LEFT HEART CATH AND CORONARY ANGIOGRAPHY N/A 03/14/2021   Procedure: RIGHT/LEFT HEART CATH AND CORONARY ANGIOGRAPHY;  Surgeon: Lorretta Harp, MD;  Location: [place] CV LAB;  Service: Cardiovascular;  Laterality: N/A;  . TEE WITHOUT CARDIOVERSION N/A 03/14/2021   Procedure: TRANSESOPHAGEAL ECHOCARDIOGRAM (TEE);  Surgeon: Sueanne Margarita, MD;  Location: [facility] ENDOSCOPY;  Service: Cardiovascular;  Laterality: N/A;  . TONSILLECTOMY  1960    Family History  Problem Relation Age of Onset  . Hypertension Mother   . Alzheimer's disease Mother 27  . AAA (abdominal aortic aneurysm) Mother   . Stroke Father 55  . Kidney cancer Brother        mets  . Bone cancer Brother 31  . Colon cancer Neg Hx     Social History   Socioeconomic History  . Marital status: Married    Spouse name: [name]  . Number of children: 3  . Years of education: Not on file  . Highest education level: Master's degree (e.g., MA, MS, MEng, MEd, MSW, MBA)  Occupational History  . Occupation: retired  Tobacco Use  . Smoking status: Never Smoker  . Smokeless tobacco: Never Used  Vaping Use  . Vaping Use: Never used  Substance and Sexual Activity  . Alcohol use: No  . Drug use: No  . Sexual activity: Not on file  Other Topics Concern  . Not on file  Social History Narrative   Married, lives with spouse. Retired Licensed conveyancer, Print production planner. Has 3 kids (moved to [place] to be closer)- youngest son MD. Dorie Rank to [place] from [place] 06/2010, lived in [place] x 10years   Social Determinants of Health   Financial Resource Strain: [place]   . Difficulty of Paying Living Expenses: Not hard at all  Food Insecurity: Not on file  Transportation Needs: Not on file  Physical Activity: Not on file   Stress: Not on file  Social Connections: Not on file  Intimate Partner Violence: Not on file    Review of systems: Review of Systems  Constitutional: Negative for fever and chills.  HENT: Negative.   Eyes: Negative for blurred vision.  Respiratory: as per HPI  Cardiovascular: Negative for chest pain and palpitations.  Gastrointestinal: Negative for vomiting, diarrhea, blood per rectum. Genitourinary: Negative for dysuria, urgency, frequency and hematuria.  Musculoskeletal: Negative for myalgias, back pain and joint pain.  Skin: Negative for itching and rash.  Neurological: Negative for dizziness, tremors, focal weakness, seizures and loss of consciousness.  Endo/Heme/Allergies: Negative for environmental allergies.  Psychiatric/Behavioral: Negative for depression, suicidal ideas and hallucinations.  All other systems reviewed and are negative.  Physical Exam: Blood pressure (!) 102/58, pulse (!) 38, temperature 97.8 F (36.6 C), temperature source Temporal, height 5\' 3"  (1.6 m), weight 134 lb 12.8 oz (61.1 kg), SpO2  96 %. Gen:      No acute distress HEENT:  EOMI, sclera anicteric Neck:     No masses; no thyromegaly Lungs:    Clear to auscultation bilaterally; normal respiratory effort CV:         Regular rate and rhythm; no murmurs Abd:      + bowel sounds; soft, non-tender; no palpable masses, no distension Ext:    No edema; adequate peripheral perfusion Skin:      Warm and dry; no rash Neuro: alert and oriented x 3 Psych: normal mood and affect  Data Reviewed: Imaging: CTA 02/02/2021- no pulmonary embolism, bilateral effusions right greater than left associated with atelectasis.  Scattered groundglass opacities.  Cardiomegaly.  Chest x-ray 02/26/2021-resolution of pleural effusions and airspace disease.  Mild atelectasis at the lung base I have reviewed the images personally.  PFTs:  Labs:  Assessment:  Abnormal CT I have reviewed the CT with bilateral effusions and  groundglass opacities.  She already received antibiotics for possible infection and tested negative for COVID I suspect the CT findings are secondary to severe mitral regurgitation and heart failure  Follow-up chest x-ray after she got Lasix shows improvement in lung findings.  She is scheduled to undergo mitral valve replacement next month.  I will reassess her after surgery and possibly get high-res CT and PFTs to make sure there is no underlying interstitial lung disease  Return to clinic in 3 months after surgery  Plan/Recommendations: Return to clinic in 3 months  Marshell Garfinkel MD [facility] 04/10/2021, 9:08 AM  CC: Binnie Rail, MD

## 2021-04-10 NOTE — Patient Instructions (Signed)
I have reviewed your CT.  It shows some fluid around the lung and changes which is likely due to your heart condition and valve insufficiency  I do not feel that anything intrinsically wrong with the lung  After your heart surgery we will schedule a follow-up visit for reassessment.  At that time we may get CT and lung function test for better evaluation of the lung  Follow-up in clinic in 3 months

## 2021-04-15 ENCOUNTER — Encounter: Payer: PPO | Admitting: Thoracic Surgery (Cardiothoracic Vascular Surgery)

## 2021-05-16 NOTE — Progress Notes (Signed)
Surgical Instructions    Your procedure is scheduled on 05/21/21.  Report to [facility] Main Entrance "A" at 5:30 A.M., then check in with the Admitting office.   Call this number if you have problems the morning of surgery:  [phone]   If you have any questions prior to your surgery date call [phone]: Open Monday-Friday 8am-4pm    Remember:  Do not eat or drink after midnight the night before your surgery    Take these medicines the morning of surgery with A SIP OF WATER :  acetaminophen (TYLENOL)  metoprolol succinate (TOPROL-XL)  As of today, STOP taking any Aspirin (unless otherwise instructed by your surgeon) Aleve, Naproxen, Ibuprofen, Motrin, Advil, Goody's, BC's, all herbal medications, fish oil, and all vitamins.          Do not wear jewelry or makeup Do not wear lotions, powders, perfumes or deodorant. Do not shave 48 hours prior to surgery.   Do not bring valuables to the hospital. DO Not wear nail polish, gel polish, artificial nails, or any other type of covering on  natural nails including finger and toenails. If patients have artificial nails, gel coating, etc. that need to be removed by a nail salon please have this removed prior to surgery or surgery may need to be canceled/delayed if the surgeon/ anesthesia feels like the patient is unable to be adequately monitored.             [facility] is not responsible for any belongings or valuables.  Do NOT Smoke (Tobacco/Vaping) or drink Alcohol 24 hours prior to your procedure If you use a CPAP at night, you may bring all equipment for your overnight stay.   Contacts, glasses, dentures or bridgework may not be worn into surgery, please bring cases for these belongings   For patients admitted to the hospital, discharge time will be determined by your treatment team.   Patients discharged the day of surgery will not be allowed to drive home, and someone needs to stay with them for 24 hours.  ONLY 1 SUPPORT  PERSON MAY BE PRESENT WHILE YOU ARE IN SURGERY. IF YOU ARE TO BE ADMITTED ONCE YOU ARE IN YOUR ROOM YOU WILL BE ALLOWED TWO (2) VISITORS.  Minor children may have two parents present. Special consideration for safety and communication needs will be reviewed on a case by case basis.  Special instructions:    Oral Hygiene is also important to reduce your risk of infection.  Remember - BRUSH YOUR TEETH THE MORNING OF SURGERY WITH YOUR REGULAR TOOTHPASTE   [place]- Preparing For Surgery  Before surgery, you can play an important role. Because skin is not sterile, your skin needs to be as free of germs as possible. You can reduce the number of germs on your skin by washing with CHG (chlorahexidine gluconate) Soap before surgery.  CHG is an antiseptic cleaner which kills germs and bonds with the skin to continue killing germs even after washing.     Please do not use if you have an allergy to CHG or antibacterial soaps. If your skin becomes reddened/irritated stop using the CHG.  Do not shave (including legs and underarms) for at least 48 hours prior to first CHG shower. It is OK to shave your face.  Please follow these instructions carefully.     Shower the NIGHT BEFORE SURGERY and the MORNING OF SURGERY with CHG Soap.   If you chose to wash your hair, wash your hair first as  usual with your normal shampoo. After you shampoo, rinse your hair and body thoroughly to remove the shampoo.  Then [employer] and genitals (private parts) with your normal soap and rinse thoroughly to remove soap.  After that Use CHG Soap as you would any other liquid soap. You can apply CHG directly to the skin and wash gently with a scrungie or a clean washcloth.   Apply the CHG Soap to your body ONLY FROM THE NECK DOWN.  Do not use on open wounds or open sores. Avoid contact with your eyes, ears, mouth and genitals (private parts). Wash Face and genitals (private parts)  with your normal soap.   Wash thoroughly,  paying special attention to the area where your surgery will be performed.  Thoroughly rinse your body with warm water from the neck down.  DO NOT shower/wash with your normal soap after using and rinsing off the CHG Soap.  Pat yourself dry with a CLEAN TOWEL.  Wear CLEAN PAJAMAS to bed the night before surgery  Place CLEAN SHEETS on your bed the night before your surgery  DO NOT SLEEP WITH PETS.   Day of Surgery:  Take a shower with CHG soap. Wear Clean/Comfortable clothing the morning of surgery Do not apply any deodorants/lotions.   Remember to brush your teeth WITH YOUR REGULAR TOOTHPASTE.   Please read over the following fact sheets that you were given.

## 2021-05-17 ENCOUNTER — Ambulatory Visit (HOSPITAL_COMMUNITY)
Admission: RE | Admit: 2021-05-17 | Discharge: 2021-05-17 | Disposition: A | Discharge status: Home / Self Care | Payer: PPO | Source: Ambulatory Visit | Attending: Thoracic Surgery (Cardiothoracic Vascular Surgery) | Admitting: Thoracic Surgery (Cardiothoracic Vascular Surgery)

## 2021-05-17 ENCOUNTER — Encounter (HOSPITAL_COMMUNITY): Payer: Self-pay

## 2021-05-17 ENCOUNTER — Encounter (HOSPITAL_COMMUNITY)
Admission: RE | Admit: 2021-05-17 | Discharge: 2021-05-17 | Disposition: A | Discharge status: Home / Self Care | Payer: PPO | Source: Ambulatory Visit | Attending: Thoracic Surgery (Cardiothoracic Vascular Surgery) | Admitting: Thoracic Surgery (Cardiothoracic Vascular Surgery)

## 2021-05-17 ENCOUNTER — Other Ambulatory Visit: Payer: Self-pay

## 2021-05-17 DIAGNOSIS — I340 Nonrheumatic mitral (valve) insufficiency: Secondary | ICD-10-CM | POA: Insufficient documentation

## 2021-05-17 DIAGNOSIS — I10 Essential (primary) hypertension: Secondary | ICD-10-CM | POA: Diagnosis not present

## 2021-05-17 DIAGNOSIS — Z01818 Encounter for other preprocedural examination: Secondary | ICD-10-CM | POA: Insufficient documentation

## 2021-05-17 DIAGNOSIS — Z20822 Contact with and (suspected) exposure to covid-19: Secondary | ICD-10-CM | POA: Insufficient documentation

## 2021-05-17 DIAGNOSIS — I517 Cardiomegaly: Secondary | ICD-10-CM | POA: Diagnosis not present

## 2021-05-17 DIAGNOSIS — E785 Hyperlipidemia, unspecified: Secondary | ICD-10-CM | POA: Diagnosis not present

## 2021-05-17 LAB — URINALYSIS, ROUTINE W REFLEX MICROSCOPIC
Bacteria, UA: NONE SEEN
Bilirubin Urine: NEGATIVE
Glucose, UA: NEGATIVE mg/dL
Ketones, ur: NEGATIVE mg/dL
Leukocytes,Ua: NEGATIVE
Nitrite: NEGATIVE
Protein, ur: NEGATIVE mg/dL
Specific Gravity, Urine: 1.01 (ref 1.005–1.030)
pH: 5 (ref 5.0–8.0)

## 2021-05-17 LAB — APTT: aPTT: 27 seconds (ref 24–36)

## 2021-05-17 LAB — CBC
HCT: 41.9 % (ref 36.0–46.0)
Hemoglobin: 13.8 g/dL (ref 12.0–15.0)
MCH: 32.6 pg (ref 26.0–34.0)
MCHC: 32.9 g/dL (ref 30.0–36.0)
MCV: 99.1 fL (ref 80.0–100.0)
Platelets: 199 10*3/uL (ref 150–400)
RBC: 4.23 MIL/uL (ref 3.87–5.11)
RDW: 12.9 % (ref 11.5–15.5)
WBC: 8.2 10*3/uL (ref 4.0–10.5)
nRBC: 0 % (ref 0.0–0.2)

## 2021-05-17 LAB — COMPREHENSIVE METABOLIC PANEL
ALT: 25 U/L (ref 0–44)
AST: 21 U/L (ref 15–41)
Albumin: 4.1 g/dL (ref 3.5–5.0)
Alkaline Phosphatase: 40 U/L (ref 38–126)
Anion gap: 7 (ref 5–15)
BUN: 24 mg/dL — ABNORMAL HIGH (ref 8–23)
CO2: 25 mmol/L (ref 22–32)
Calcium: 9.4 mg/dL (ref 8.9–10.3)
Chloride: 104 mmol/L (ref 98–111)
Creatinine, Ser: 0.85 mg/dL (ref 0.44–1.00)
GFR, Estimated: >60 mL/min (ref >60)
Glucose, Bld: 101 mg/dL — ABNORMAL HIGH (ref 70–99)
Potassium: 4.1 mmol/L (ref 3.5–5.1)
Sodium: 136 mmol/L (ref 135–145)
Total Bilirubin: 0.7 mg/dL (ref 0.3–1.2)
Total Protein: 6.8 g/dL (ref 6.5–8.1)

## 2021-05-17 LAB — BLOOD GAS, ARTERIAL
Acid-Base Excess: 1 mmol/L (ref 0.0–2.0)
Bicarbonate: 25 mmol/L (ref 20.0–28.0)
Drawn by: 58793
FIO2: 21
O2 Saturation: 97.7 %
Patient temperature: 37
pCO2 arterial: 39 mmHg (ref 32.0–48.0)
pH, Arterial: 7.423 (ref 7.350–7.450)
pO2, Arterial: 100 mmHg (ref 83.0–108.0)

## 2021-05-17 LAB — SARS CORONAVIRUS 2 (TAT 6-24 HRS): SARS Coronavirus 2: NEGATIVE

## 2021-05-17 LAB — TYPE AND SCREEN
ABO/RH(D): A POS
Antibody Screen: NEGATIVE

## 2021-05-17 LAB — PROTIME-INR
INR: 1.1 (ref 0.8–1.2)
Prothrombin Time: 13.9 seconds (ref 11.4–15.2)

## 2021-05-17 LAB — SURGICAL PCR SCREEN
MRSA, PCR: NEGATIVE
Staphylococcus aureus: NEGATIVE

## 2021-05-17 NOTE — Progress Notes (Signed)
Pre-CABG (MVR) Dopplers completed.  Refer to "CV Proc" under chart review to view preliminary results.  05/17/2021 1:50 PM Kelby Aline., MHA, RVT, RDCS, RDMS

## 2021-05-17 NOTE — Progress Notes (Signed)
PCP: Billey Gosling, MD Cardiologist: Quay Burow, MD   EKG: 05/17/21 CXR: 05/17/21 ECHO: 02/27/21 Stress Test: 09/13/10 Cardiac Cath: 03/14/21  Fasting Blood Sugar- na Checks Blood Sugar__na_ times a day  OSA/CPAP: No  ASA/Blood Thinners: No  Covid test 05/17/21 at PAT  Anesthesia Review: Yes, cardiac history  Patient denies shortness of breath, fever, cough, and chest pain at PAT appointment.  Patient verbalized understanding of instructions provided today at the PAT appointment.  Patient asked to review instructions at home and day of surgery.

## 2021-05-20 ENCOUNTER — Ambulatory Visit: Payer: PPO | Admitting: Thoracic Surgery (Cardiothoracic Vascular Surgery)

## 2021-05-20 ENCOUNTER — Other Ambulatory Visit: Payer: Self-pay

## 2021-05-20 ENCOUNTER — Encounter: Payer: Self-pay | Admitting: Thoracic Surgery (Cardiothoracic Vascular Surgery)

## 2021-05-20 ENCOUNTER — Encounter (HOSPITAL_COMMUNITY): Payer: Self-pay | Admitting: Thoracic Surgery (Cardiothoracic Vascular Surgery)

## 2021-05-20 VITALS — BP 121/56 | HR 69 | Resp 20 | Ht 63.0 in | Wt 131.0 lb

## 2021-05-20 DIAGNOSIS — I340 Nonrheumatic mitral (valve) insufficiency: Secondary | ICD-10-CM

## 2021-05-20 DIAGNOSIS — I5032 Chronic diastolic (congestive) heart failure: Secondary | ICD-10-CM

## 2021-05-20 DIAGNOSIS — I341 Nonrheumatic mitral (valve) prolapse: Secondary | ICD-10-CM | POA: Diagnosis not present

## 2021-05-20 MED ORDER — TRANEXAMIC ACID 1000 MG/10ML IV SOLN
1.5000 mg/kg/h | INTRAVENOUS | Status: AC
  Administered 2021-05-21: 1.5 mg/kg/h via INTRAVENOUS
  Filled 2021-05-20: qty 25

## 2021-05-20 MED ORDER — CEFAZOLIN SODIUM-DEXTROSE 2-4 GM/100ML-% IV SOLN
2.0000 g | INTRAVENOUS | Status: AC
  Administered 2021-05-21 (×2): 2 g via INTRAVENOUS
  Filled 2021-05-20: qty 100

## 2021-05-20 MED ORDER — VANCOMYCIN HCL 1000 MG IV SOLR
INTRAVENOUS | Status: DC
  Filled 2021-05-20: qty 1000

## 2021-05-20 MED ORDER — TRANEXAMIC ACID (OHS) BOLUS VIA INFUSION
15.0000 mg/kg | INTRAVENOUS | Status: AC
  Administered 2021-05-21: 894 mg via INTRAVENOUS
  Filled 2021-05-20: qty 894

## 2021-05-20 MED ORDER — CEFAZOLIN SODIUM-DEXTROSE 2-4 GM/100ML-% IV SOLN
2.0000 g | INTRAVENOUS | Status: DC
  Filled 2021-05-20: qty 100

## 2021-05-20 MED ORDER — PLASMA-LYTE A IV SOLN
INTRAVENOUS | Status: DC
  Filled 2021-05-20: qty 5

## 2021-05-20 MED ORDER — SODIUM CHLORIDE 0.9 % IV SOLN
INTRAVENOUS | Status: DC
  Filled 2021-05-20: qty 30

## 2021-05-20 MED ORDER — DEXMEDETOMIDINE HCL IN NACL 400 MCG/100ML IV SOLN
0.1000 ug/kg/h | INTRAVENOUS | Status: AC
  Administered 2021-05-21: .7 ug/kg/h via INTRAVENOUS
  Filled 2021-05-20: qty 100

## 2021-05-20 MED ORDER — INSULIN REGULAR(HUMAN) IN NACL 100-0.9 UT/100ML-% IV SOLN
INTRAVENOUS | Status: AC
  Administered 2021-05-21: 1 [IU]/h via INTRAVENOUS
  Filled 2021-05-20 (×2): qty 100

## 2021-05-20 MED ORDER — MANNITOL 20 % IV SOLN
Freq: Once | INTRAVENOUS | Status: DC
  Filled 2021-05-20: qty 13

## 2021-05-20 MED ORDER — EPINEPHRINE HCL 5 MG/250ML IV SOLN IN NS
0.0000 ug/min | INTRAVENOUS | Status: DC
  Filled 2021-05-20: qty 250

## 2021-05-20 MED ORDER — TRANEXAMIC ACID (OHS) PUMP PRIME SOLUTION
2.0000 mg/kg | INTRAVENOUS | Status: DC
  Filled 2021-05-20: qty 1.19

## 2021-05-20 MED ORDER — POTASSIUM CHLORIDE 2 MEQ/ML IV SOLN
80.0000 meq | INTRAVENOUS | Status: DC
  Filled 2021-05-20: qty 40

## 2021-05-20 MED ORDER — VANCOMYCIN HCL 1250 MG/250ML IV SOLN
1250.0000 mg | INTRAVENOUS | Status: AC
  Administered 2021-05-21: 1250 mg via INTRAVENOUS
  Filled 2021-05-20: qty 250

## 2021-05-20 MED ORDER — NOREPINEPHRINE 4 MG/250ML-% IV SOLN
0.0000 ug/min | INTRAVENOUS | Status: DC
  Filled 2021-05-20: qty 250

## 2021-05-20 MED ORDER — NITROGLYCERIN IN D5W 200-5 MCG/ML-% IV SOLN
2.0000 ug/min | INTRAVENOUS | Status: DC
  Filled 2021-05-20: qty 250

## 2021-05-20 MED ORDER — MILRINONE LACTATE IN DEXTROSE 20-5 MG/100ML-% IV SOLN
0.3000 ug/kg/min | INTRAVENOUS | Status: AC
  Administered 2021-05-21: .375 ug/kg/min via INTRAVENOUS
  Filled 2021-05-20: qty 100

## 2021-05-20 MED ORDER — PHENYLEPHRINE HCL-NACL 20-0.9 MG/250ML-% IV SOLN
30.0000 ug/min | INTRAVENOUS | Status: AC
Start: 2021-05-21 — End: 2021-05-21
  Administered 2021-05-21: 15 ug/min via INTRAVENOUS
  Filled 2021-05-20: qty 250

## 2021-05-20 NOTE — Progress Notes (Signed)
[address]       [place] [address]             [phone]     CARDIOTHORACIC SURGERY OFFICE NOTE  Primary Cardiologist is Quay Burow, MD PCP is Binnie Rail, MD   HPI:  Patient is 75 year old female with history of hypertension, hyperlipidemia, degenerative arthritis, PVCs, migraine headaches, irritable bowel syndrome, GE reflux disease, and hiatal hernia who returns to the office today for follow-up with plans to proceed with surgery tomorrow for recently discovered mitral valve prolapse with severe mitral regurgitation.  She was originally seen in consultation on Apr 09, 2021.  She reports no new problems or complaints and she is eager to proceed with elective surgery.  She states that she has completed her last dose of Lasix and she no longer has any significant shortness of breath or swelling.  She states that she only gets short of breath with more strenuous exertion.  She has been sleeping comfortably at night.  She still has an intermittent mild dry nonproductive cough.   Current Outpatient Medications  Medication Sig Dispense Refill   acetaminophen (TYLENOL) 500 MG tablet Take 500 mg by mouth daily.     atorvastatin (LIPITOR) 40 MG tablet Take 1/2 (one-half) tablet by mouth once daily (Patient taking differently: Take 20 mg by mouth at bedtime.) 45 tablet 2   butalbital-acetaminophen-caffeine (FIORICET) 50-325-40 MG tablet TAKE 1 TABLET BY MOUTH EVERY 4 HOURS AS NEEDED FOR HEADACHE 30 tablet 0   calcium carbonate (OS-CAL - DOSED IN MG OF ELEMENTAL CALCIUM) 1250 (500 Ca) MG tablet Take 1 tablet by mouth at bedtime.     Cholecalciferol (VITAMIN D3) 1000 units CAPS Take 1,000 Units by mouth at bedtime.     cyanocobalamin 1000 MCG tablet Take 1,000 mcg by mouth daily.     furosemide (LASIX) 20 MG tablet Take 20 mg by mouth daily.     gabapentin (NEURONTIN) 100 MG capsule Take 1 capsule (100 mg total) by mouth at bedtime. (Patient taking differently:  Take 100 mg by mouth at bedtime as needed (pain).)     losartan (COZAAR) 100 MG tablet Take 0.5 tablets (50 mg total) by mouth daily. 90 tablet 0   metoprolol succinate (TOPROL-XL) 50 MG 24 hr tablet Take 1/2 tablet twice daily. Take with or immediately following a meal. (Patient taking differently: Take 25 mg by mouth in the morning and at bedtime. Take with or immediately following a meal.) 90 tablet 1   Peppermint Oil (IBGARD) 90 MG CPCR Take as directed (Patient taking differently: Take 90 mg by mouth daily before lunch. Take as directed) 16 capsule 0   potassium chloride SA (KLOR-CON) 20 MEQ tablet 1/2 tablet daily (Patient taking differently: Take 10 mEq by mouth daily.)     No current facility-administered medications for this visit.   Facility-Administered Medications Ordered in Other Visits  Medication Dose Route Frequency Provider Last Rate Last Admin   [START ON 05/21/2021] ceFAZolin (ANCEF) IVPB 2g/100 mL premix  2 g Intravenous To OR Rexene Alberts, MD       [START ON 05/21/2021] ceFAZolin (ANCEF) IVPB 2g/100 mL premix  2 g Intravenous To OR Rexene Alberts, MD       [START ON 05/21/2021] dexmedetomidine (PRECEDEX) 400 MCG/100ML (4 mcg/mL) infusion  0.1-0.7 mcg/kg/hr Intravenous To OR Rexene Alberts, MD       [START ON 05/21/2021] EPINEPHrine (ADRENALIN) 5 mg in NS 250 mL (0.02  mg/mL) premix infusion  0-10 mcg/min Intravenous To OR Rexene Alberts, MD       [START ON 05/21/2021] heparin 30,000 units/NS 1000 mL solution for CELLSAVER   Other To OR Rexene Alberts, MD       [START ON 05/21/2021] heparin sodium (porcine) 5,000 Units, papaverine 60 mg in electrolyte-A (PLASMALYTE-A PH 7.4) 1,000 mL irrigation   Irrigation To OR Rexene Alberts, MD       [START ON 05/21/2021] insulin regular, human (MYXREDLIN) 100 units/ 100 mL infusion   Intravenous To OR Rexene Alberts, MD       [facility] Blood Cardioplegia vial (lidocaine/magnesium/mannitol 8.67E-7M-0.9O)   Intracoronary Once Rexene Alberts, MD       [START ON 05/21/2021] milrinone (PRIMACOR) 20 MG/100 ML (0.2 mg/mL) infusion  0.3 mcg/kg/min Intravenous To OR Rexene Alberts, MD       [START ON 05/21/2021] nitroGLYCERIN 50 mg in dextrose 5 % 250 mL (0.2 mg/mL) infusion  2-200 mcg/min Intravenous To OR Rexene Alberts, MD       [START ON 05/21/2021] norepinephrine (LEVOPHED) 4mg  in 226mL premix infusion  0-40 mcg/min Intravenous To OR Rexene Alberts, MD       [START ON 05/21/2021] phenylephrine (NEOSYNEPHRINE) 20-0.9 MG/250ML-% infusion  30-200 mcg/min Intravenous To OR Rexene Alberts, MD       [START ON 05/21/2021] potassium chloride injection 80 mEq  80 mEq Other To OR Rexene Alberts, MD       [START ON 05/21/2021] tranexamic acid (CYKLOKAPRON) 2,500 mg in sodium chloride 0.9 % 250 mL (10 mg/mL) infusion  1.5 mg/kg/hr Intravenous To OR Rexene Alberts, MD       [START ON 05/21/2021] tranexamic acid (CYKLOKAPRON) bolus via infusion - over 30 minutes 894 mg  15 mg/kg Intravenous To OR Rexene Alberts, MD       [START ON 05/21/2021] tranexamic acid (CYKLOKAPRON) pump prime solution 119 mg  2 mg/kg Intracatheter To OR Rexene Alberts, MD       [START ON 05/21/2021] vancomycin (VANCOCIN) 1,000 mg in sodium chloride 0.9 % 1,000 mL irrigation   Irrigation To OR Rexene Alberts, MD       [START ON 05/21/2021] vancomycin (VANCOREADY) IVPB 1250 mg/250 mL  1,250 mg Intravenous To OR Rexene Alberts, MD          Physical Exam:   BP (!) 121/56 (BP Location: Right Arm, Patient Position: Sitting)   Pulse 69   Resp 20   Ht 5\' 3"  (1.6 m)   Wt 131 lb (59.4 kg)   SpO2 94% Comment: RA  BMI 23.21 kg/m   General:  Well-appearing  Chest:   Clear to auscultation with symmetrical breath sounds  CV:   Regular rate and rhythm with systolic murmur  Incisions:  N/A  Abdomen:  Soft nontender  Extremities:  Warm and well-perfused  Diagnostic Tests:  CHEST - 2 VIEW   COMPARISON:  February 26, 2021.   FINDINGS: Trachea is midline.  Cardiomediastinal contours and hilar structures with cardiomegaly as before and with signs of mitral annular calcification. Lungs are clear. No sign of pneumothorax. No pleural effusion.   On limited assessment no acute skeletal process.   IMPRESSION: Cardiomegaly with mitral annular calcification.   No radiographic signs of acute cardiopulmonary disease.     Electronically Signed   By: Zetta Bills M.D.   On: 05/17/2021 14:39   Impression:  Patient has mitral valve prolapse with stage  D severe symptomatic primary mitral regurgitation.  She describes a 2-year history of progressive symptoms of exertional shortness of breath and fatigue consistent with chronic diastolic congestive heart failure which has recently improved on medical therapy, currently [place] Heart Association functional class II.   I have personally reviewed the patient's recent echocardiograms and diagnostic cardiac catheterization.  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  Left ventricular systolic function appears reasonably well-preserved.  There is moderate left atrial enlargement.  Diagnostic cardiac catheterization is notable of the absence of significant coronary artery disease and reveals moderate pulmonary hypertension.  I agree the patient needs mitral valve repair.  Risks associated with conventional surgery will be slightly elevated only because of the patient's age.     Plan:   The patient and husband were again counseled at length regarding her diagnosis of severe primary mitral regurgitation.  We reviewed the results of their diagnostic tests including images from the most recent echocardiogram.  We discussed the natural history of mitral regurgitation as well as  alternative treatment strategies.  We discussed the impact of her age, current state of health, and any significant comorbid medical problems on clinical decision making.  We went on to discuss the indications, risks and potential benefits of mitral valve repair as well as the timing of surgical intervention.  The rationale for elective surgery has been explained, including a comparison between surgery and continued medical therapy with close follow-up.  The likelihood of successful and durable mitral valve repair has been discussed with particular reference to the findings of the most recent echocardiogram.  Based upon these findings and previous experience, I have quoted a greater than 98 percent likelihood of successful valve repair with less than 2 percent risk of mortality or major morbidity.  Alternative  surgical approaches have been discussed including a comparison between conventional sternotomy and minimally-invasive techniques.  Transcatheter edge-to-edge repair using Mitraclip was discussed as an alternative to conventional surgery that is typically reserved for patients who are felt to be at relatively high risk.  The relative risks and benefits of each have been reviewed as they pertain to the patient's specific circumstances, and expectations for the patient's postoperative convalescence has been discussed.  The patient understands and accepts all potential risks of surgery including but not limited to risk of death, stroke or other neurologic complication, myocardial infarction, congestive heart failure, respiratory failure, renal failure, bleeding requiring transfusion and/or reexploration, arrhythmia, heart block or bradycardia requiring permanent pacemaker insertion, infection or other wound complications, pneumonia, pleural and/or pericardial effusion, pulmonary embolus, aortic dissection or other major vascular complication, or other immediate or delayed complications related to valve repair  or replacement including but not limited to recurrent or persistent mitral regurgitation and/or mitral stenosis, LV outflow tract obstruction, aortic insufficiency, paravalvular leak, posterior AV groove disruption, structural valve deterioration and failure, thrombosis, embolization, or endocarditis.  Specific risks potentially related to the minimally-invasive approach were discussed at length, including but not limited to risk of conversion to full or partial sternotomy, aortic dissection or other major vascular complication, unilateral acute lung injury or pulmonary edema, phrenic nerve dysfunction or paralysis, rib fracture, chronic pain, lung hernia, or lymphocele. All of her questions have been answered.     I spent in excess of 15 minutes during the conduct of this office consultation and >50% of  this time involved direct face-to-face encounter with the patient for counseling and/or coordination of their care.    Valentina Gu. Roxy Manns, MD 05/20/2021 4:26 PM

## 2021-05-20 NOTE — H&P (Signed)
[address]       [place] [address]             [phone]          CARDIOTHORACIC SURGERY HISTORY AND PHYSICAL EXAM  Referring Provider is Lorretta Harp, MD PCP is Binnie Rail, MD  Chief Complaint  Patient presents with   Mitral Regurgitation      Initial surgical consult, ECHO 4/21, cath 4/21   HPI:   Patient is 75 year old female with history of hypertension, hyperlipidemia, degenerative arthritis, PVCs, migraine headaches, irritable bowel syndrome, GE reflux disease, and hiatal hernia who has been referred for surgical consultation to discuss treatment options for recently discovered mitral valve prolapse with severe mitral regurgitation.   Patient denies any known history of heart murmur until recently.  She has been evaluated in the past for symptoms of atypical chest pain and palpitations.  Over the past 2 years she has developed progressive symptoms of exertional shortness of breath and fatigue.  She was recently noted to have a systolic murmur on physical exam by her son who is a primary care physician.  She was referred to Dr. Gwenlyn Found for cardiology consultation and transthoracic echocardiogram performed February 27, 2021 revealed mitral valve prolapse with at least moderate and possibly severe mitral regurgitation.  Left ventricular systolic function was reported to be normal with ejection fraction estimated 55 to 60%.  There was moderate left atrial enlargement.  The patient subsequently underwent transesophageal echocardiogram March 14, 2021 which confirmed the presence of mitral valve prolapse and severe mitral regurgitation.  There was systolic flow reversal in the pulmonary veins.  There was moderate left atrial enlargement and normal left ventricular function with ejection fraction estimated 60 to 65%.  Diagnostic cardiac catheterization was performed by Dr. Gwenlyn Found and revealed normal coronary artery anatomy with no significant coronary artery disease.   Pulmonary artery pressures were mild to moderately elevated.  Cardiothoracic surgical consultation was requested.   Patient is married and lives locally in [place] with her husband.  She is originally from [place] but has lived in the [place] for more than 30 years.  She is a retired Radio producer.  She remained reasonably active physically although she admits that she does not walk as much as she used to in the past because of degenerative arthritis in both knees.  More recently she has been limited primarily by shortness of breath.  Symptoms began approximately 2 years ago and have progressed.  She now intermittently gets short of breath with low-level activity but she denies resting shortness of breath.  She denies any history of orthopnea but she has had occasional episodes of PND.  She reports occasional tachypalpitations without dizziness or syncope.  She has not had any exertional chest pain or chest tightness.  She denies any history of lower extremity edema.  Shortness of breath is exacerbated by lying on her left side.   Patient is 75 year old female with history of hypertension, hyperlipidemia, degenerative arthritis, PVCs, migraine headaches, irritable bowel syndrome, GE reflux disease, and hiatal hernia who returns to the office [name] for follow-up with plans to proceed with surgery tomorrow for recently discovered mitral valve prolapse with severe mitral regurgitation.  She was originally seen in consultation on Apr 09, 2021.  She reports no new problems or complaints and she is eager to proceed with elective surgery.  She states that she has completed her last dose of Lasix and she no longer has  any significant shortness of breath or swelling.  She states that she only gets short of breath with more strenuous exertion.  She has been sleeping comfortably at night.  She still has an intermittent mild dry nonproductive cough.  Past Medical History:  Diagnosis Date   Arthritis     Atypical chest pain    Diverticulosis    Gastritis 11/08/2007, 04/12/2012   H pylori bx neg 03/2012   GERD (gastroesophageal reflux disease) 11/08/2007, 04/12/2012   Hepatic cyst    Hiatal hernia 11/08/2007, 04/12/2012   History of kidney stones    Hyperlipidemia    Hypertension    IBS (irritable bowel syndrome)    diarrhea predom   Migraine    Mitral regurgitation    Osteoporosis    DEXA 01/21/2012: -2.1 spine and R fem, -1.8 L fem s/p fosamax  2004-2011 DEXA 04/18/14 @ [facility]: -2.5 - rec to start Prolia     PVC's (premature ventricular contractions)    Schatzki's ring 11/11/2010   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   Shingles outbreak 04/2013    Past Surgical History:  Procedure Laterality Date   [place] STUDY  03/14/2021   Procedure: BUBBLE STUDY;  Surgeon: Sueanne Margarita, MD;  Location: [facility];  Service: Cardiovascular;;   COLONOSCOPY     CYSTOCELE REPAIR  2008   ESOPHAGOGASTRODUODENOSCOPY     Maxilofacial  1992   RIGHT/LEFT HEART CATH AND CORONARY ANGIOGRAPHY N/A 03/14/2021   Procedure: RIGHT/LEFT HEART CATH AND CORONARY ANGIOGRAPHY;  Surgeon: Lorretta Harp, MD;  Location: [facility];  Service: Cardiovascular;  Laterality: N/A;   TEE WITHOUT CARDIOVERSION N/A 03/14/2021   Procedure: TRANSESOPHAGEAL ECHOCARDIOGRAM (TEE);  Surgeon: Sueanne Margarita, MD;  Location: [facility];  Service: Cardiovascular;  Laterality: N/A;   TONSILLECTOMY  1960    Family History  Problem Relation Age of Onset   Hypertension Mother    Alzheimer's disease Mother 31   AAA (abdominal aortic aneurysm) Mother    Stroke Father 20   Kidney cancer Brother        mets   Bone cancer Brother 33   Colon cancer Neg Hx     Social History Social History   Tobacco Use   Smoking status: Never   Smokeless tobacco: Never  Vaping Use   Vaping Use: Never used  Substance Use Topics   Alcohol use: No   Drug use: No    Prior to Admission medications   Medication Sig  Start Date End Date Taking? Authorizing Provider  acetaminophen (TYLENOL) 500 MG tablet Take 500 mg by mouth daily.   Yes [provider]  atorvastatin (LIPITOR) 40 MG tablet Take 1/2 (one-half) tablet by mouth once daily Patient taking differently: Take 20 mg by mouth at bedtime. 03/05/21  Yes Burns, Claudina Lick, MD  calcium carbonate (OS-CAL - DOSED IN MG OF ELEMENTAL CALCIUM) 1250 (500 Ca) MG tablet Take 1 tablet by mouth at bedtime.   Yes [provider]  Cholecalciferol (VITAMIN D3) 1000 units CAPS Take 1,000 Units by mouth at bedtime.   Yes [provider]  cyanocobalamin 1000 MCG tablet Take 1,000 mcg by mouth daily.   Yes [provider]  furosemide (LASIX) 20 MG tablet Take 20 mg by mouth daily.   Yes [provider]  gabapentin (NEURONTIN) 100 MG capsule Take 1 capsule (100 mg total) by mouth at bedtime. Patient taking differently: Take 100 mg by mouth at bedtime as needed (pain). 03/14/21  Yes Sueanne Margarita, MD  losartan (COZAAR) 100 MG tablet Take 0.5 tablets (50 mg total) by mouth daily. 03/05/21  Yes Burns, Claudina Lick, MD  metoprolol succinate (TOPROL-XL) 50 MG 24 hr tablet Take 1/2 tablet twice daily. Take with or immediately following a meal. Patient taking differently: Take 25 mg by mouth in the morning and at bedtime. Take with or immediately following a meal. 03/14/21  Yes Turner, Eber Hong, MD  Peppermint Oil (IBGARD) 90 MG CPCR Take as directed Patient taking differently: Take 90 mg by mouth daily before lunch. Take as directed 12/24/20  Yes Armbruster, Carlota Raspberry, MD  potassium chloride SA (KLOR-CON) 20 MEQ tablet 1/2 tablet daily Patient taking differently: Take 10 mEq by mouth daily. 03/14/21  Yes Turner, Eber Hong, MD  butalbital-acetaminophen-caffeine (FIORICET) 50-325-40 MG tablet TAKE 1 TABLET BY MOUTH EVERY 4 HOURS AS NEEDED FOR HEADACHE 03/13/20   Binnie Rail, MD    No Known Allergies      Review of Systems:               General:                       normal appetite, decreased energy, no weight gain, no weight loss, no fever             Cardiac:                       no chest pain with exertion, no chest pain at rest, +SOB with exertion, no resting SOB, + PND, no orthopnea, + palpitations, no arrhythmia, no atrial fibrillation, no LE edema, no dizzy spells, no syncope             Respiratory:                 + exertional shortness of breath, no home oxygen, no productive cough, + chronic dry cough, no bronchitis, no wheezing, no hemoptysis, no asthma, no pain with inspiration or cough, no sleep apnea, no CPAP at night             GI:                               no difficulty swallowing, + reflux, no frequent heartburn, no hiatal hernia, no abdominal pain, no constipation, + diarrhea, no hematochezia, no hematemesis, no melena             GU:                              no dysuria,  no frequency, no urinary tract infection, no hematuria, no kidney stones, no kidney disease             Vascular:                     no pain suggestive of claudication, no pain in feet, no leg cramps, no varicose veins, no DVT, no non-healing foot ulcer             Neuro:                         no stroke, no TIA's, no seizures, + headaches, no temporary blindness one eye,  no slurred speech, no peripheral neuropathy, no chronic pain, no instability of  gait, no memory/cognitive dysfunction             Musculoskeletal:         + arthritis, no joint swelling, no myalgias, minor difficulty walking, normal mobility             Skin:                            no rash, no itching, no skin infections, no pressure sores or ulcerations             Psych:                         no anxiety, no depression, no nervousness, no unusual recent stress             Eyes:                           no blurry vision, no floaters, no recent vision changes, + wears glasses or contacts             ENT:                            no hearing loss, no loose or painful teeth,  no dentures, last saw dentist within the past year             Hematologic:               no easy bruising, no abnormal bleeding, no clotting disorder, no frequent epistaxis             Endocrine:                   no diabetes, does not check CBG's at home                            Physical Exam:               BP 131/61 (BP Location: Left Arm, Patient Position: Sitting)   Pulse 61   Resp 20   Ht 5\' 3"  (1.6 m)   Wt 134 lb (60.8 kg)   SpO2 94% Comment: RA  BMI 23.74 kg/m              General:                        well-appearing             HEENT:                       Unremarkable             Neck:                           no JVD, no bruits, no adenopathy             Chest:                          clear to auscultation, symmetrical breath sounds, no wheezes, no rhonchi             CV:  RRR, grade IV/VI holosystolic murmur heard all across the precordium             Abdomen:                    soft, non-tender, no masses             Extremities:                 warm, well-perfused, pulses palpable, no LE edema             Rectal/GU                   Deferred             Neuro:                         Grossly non-focal and symmetrical throughout             Skin:                            Clean and dry, no rashes, no breakdown     Diagnostic Tests:      ECHOCARDIOGRAM REPORT         Patient Name:   [name] [name] Date of Exam: 02/27/2021  Medical Rec #:  [id number]        Height:       63.0 in  Accession #:    [id number]       Weight:       136.0 lb  Date of Birth:  19-Nov-1946        BSA:          1.641 m  Patient Age:    67 years         BP:           106/70 mmHg  Patient Gender: F                HR:           69 bpm.  Exam Location:  [facility]   Procedure: 2D Echo, Cardiac Doppler and Color Doppler   Indications:    I51.7 Cardiomegaly; R01.1 Murmur; R06.00 Dyspnea;     History:        Patient has no prior history of Echocardiogram   examinations.                  Signs/Symptoms:Chest Pain; Risk Factors:Hypertension and                  Dyslipidemia. Prediabetes. Heart failure.     Sonographer:    Diamond Nickel RCS  Referring Phys: [id number] [place]     1. Left ventricular ejection fraction, by estimation, is 55 to 60%. The  left ventricle has normal function. The left ventricle has no regional  wall motion abnormalities. Left ventricular diastolic parameters were  normal.   2. Right ventricular systolic function is normal. The right ventricular  size is normal.   3. Left atrial size was moderately dilated.   4. There appears to be a partially flail posterior mitral valve leaflet  with significant turburlent blood flow in the left atrium with at least  moderate MR and may be severe.The mitral valve is abnormal. Moderate  mitral valve regurgitation. No evidence  of mitral stenosis.   5. The aortic valve is normal in  structure. Aortic valve regurgitation is  not visualized. Mild aortic valve sclerosis is present, with no evidence  of aortic valve stenosis.   6. The inferior vena cava is normal in size with greater than 50%  respiratory variability, suggesting right atrial pressure of 3 mmHg.R   Recommend TEE for further evaluation of the mitral valve.  FINDINGS   Left Ventricle: Left ventricular ejection fraction, by estimation, is 55  to 60%. The left ventricle has normal function. The left ventricle has no  regional wall motion abnormalities. The left ventricular internal cavity  size was normal in size. There is   no left ventricular hypertrophy. Left ventricular diastolic parameters  were normal. Normal left ventricular filling pressure.   Right Ventricle: The right ventricular size is normal. No increase in  right ventricular wall thickness. Right ventricular systolic function is  normal.   Left Atrium: Left atrial size was moderately dilated.   Right Atrium: Right atrial size  was normal in size.   Pericardium: There is no evidence of pericardial effusion.   Mitral Valve: There appears to be a partially flail posterior mitral valve  leaflet with significant turburlent blood flow in the left atrium with at  least moderate MR and may be severe. The mitral valve is abnormal.  Moderate mitral valve regurgitation,  with eccentric anteriorly directed jet. No evidence of mitral valve  stenosis.   Tricuspid Valve: The tricuspid valve is normal in structure. Tricuspid  valve regurgitation is trivial. No evidence of tricuspid stenosis.   Aortic Valve: The aortic valve is normal in structure. Aortic valve  regurgitation is not visualized. Mild aortic valve sclerosis is present,  with no evidence of aortic valve stenosis.   Pulmonic Valve: The pulmonic valve was normal in structure. Pulmonic valve  regurgitation is not visualized. No evidence of pulmonic stenosis.   Aorta: The aortic root is normal in size and structure.   Venous: The inferior vena cava is normal in size with greater than 50%  respiratory variability, suggesting right atrial pressure of 3 mmHg.   IAS/Shunts: No atrial level shunt detected by color flow Doppler.      LEFT VENTRICLE  PLAX 2D  LVIDd:         5.00 cm  Diastology  LVIDs:         3.00 cm  LV e' medial:    8.16 cm/s  LV PW:         1.00 cm  LV E/e' medial:  15.0  LV IVS:        0.70 cm  LV e' lateral:   10.10 cm/s  LVOT diam:     1.75 cm  LV E/e' lateral: 12.1  LV SV:         42  LV SV Index:   26  LVOT Area:     2.41 cm      RIGHT VENTRICLE  RV Basal diam:  3.30 cm  RV S prime:     11.70 cm/s  TAPSE (M-mode): 1.9 cm   LEFT ATRIUM             Index       RIGHT ATRIUM           Index  LA diam:        4.10 cm 2.50 cm/m  RA Area:     16.10 cm  LA Vol (A2C):   89.6 ml 54.59 ml/m RA Volume:   42.60 ml  25.95 ml/m  LA Vol (A4C):  46.0 ml 28.03 ml/m  LA Biplane Vol: 66.7 ml 40.64 ml/m   AORTIC VALVE  LVOT Vmax:   94.00  cm/s  LVOT Vmean:  51.400 cm/s  LVOT VTI:    0.175 m     AORTA  Ao Root diam: 2.50 cm   MITRAL VALVE  MV Area (PHT): 3.66 cm     SHUNTS  MV Decel Time: 207 msec     Systemic VTI:  0.18 m  MV E velocity: 122.00 cm/s  Systemic Diam: 1.75 cm  MV A velocity: 90.00 cm/s  MV E/A ratio:  1.36   Fransico Him MD  Electronically signed by Fransico Him MD  Signature Date/Time: 02/27/2021/9:38:09 AM        TRANSESOPHOGEAL ECHO REPORT         Patient Name:   [name] [name] Date of Exam: 03/14/2021  Medical Rec #:  [id number]        Height:       63.0 in  Accession #:    [id number]       Weight:       134.4 lb  Date of Birth:  10/28/1946        BSA:          1.633 m  Patient Age:    23 years         BP:           138/72 mmHg  Patient Gender: F                HR:           96 bpm.  Exam Location:  Inpatient   Procedure: 3D Echo, Transesophageal Echo, Cardiac Doppler, Color Doppler  and             Saline Contrast Bubble Study   Indications:     I34.0 Nonrheumatic mitral (valve) insufficiency     History:         Patient has prior history of Echocardiogram examinations,  most                   recent 02/27/2021. Cardiomegaly and CHF, Abnormal ECG,  Mitral                   Valve Disease; Signs/Symptoms:Chest Pain. Mitral valve                   regurgitation.     Sonographer:     Roseanna Rainbow RDCS  Referring Phys:  [id number] TRACI R TURNER  Diagnosing Phys: Fransico Him MD   PROCEDURE: After discussion of the risks and benefits of a TEE, an  informed consent was obtained from the patient. The transesophogeal probe  was passed without difficulty through the esophogus of the patient. Imaged  were obtained with the patient in a  left lateral decubitus position. Sedation performed by different  physician. The patient was monitored while under deep sedation.  Anesthestetic sedation was provided intravenously by Anesthesiology: 220mg   of Propofol, 60mg  of Lidocaine. Image quality was   excellent. The patient's vital signs; including heart rate, blood  pressure, and oxygen saturation; remained stable throughout the procedure.  The patient developed no complications during the procedure.   IMPRESSIONS     1. Left ventricular ejection fraction, by estimation, is 60 to 65%. The  left ventricle has normal function. The left ventricle has no regional  wall motion abnormalities.   2. Right ventricular systolic function is normal. The right ventricular  size is normal.   3. Left atrial size was moderately dilated. No left atrial/left atrial  appendage thrombus was detected. The LAA emptying velocity was 69 cm/s.   4. There is mild prolapse of the anterior mitral valve leaflet and  partially flail posterior leaflet with severe MR eccentrically directed  anteriorly and wraps around the LA into the left upper pulmonary vein.  There is systolic flow reversal in the  pulmonary vein. . The mitral valve is normal in structure. Severe mitral  valve regurgitation. No evidence of mitral stenosis.   5. The aortic valve is normal in structure. Aortic valve regurgitation is  not visualized. No aortic stenosis is present.   Conclusion(s)/Recommendation(s): Normal biventricular function with  partially flail posterior MV leaflet with severe MR  FINDINGS   Left Ventricle: Left ventricular ejection fraction, by estimation, is 60  to 65%. The left ventricle has normal function. The left ventricle has no  regional wall motion abnormalities. The left ventricular internal cavity  size was normal in size. There is   no left ventricular hypertrophy.   Right Ventricle: The right ventricular size is normal. No increase in  right ventricular wall thickness. Right ventricular systolic function is  normal.   Left Atrium: Left atrial size was moderately dilated. Spontaneous echo  contrast was present in the left atrium. No left atrial/left atrial  appendage thrombus was detected. The LAA  emptying velocity was 69 cm/s.   Right Atrium: Right atrial size was normal in size. Prominent Eustachian  valve.   Pericardium: There is no evidence of pericardial effusion.   Mitral Valve: There is mild prolapse of the anterior mitral valve leaflet  and partially flail posterior leaflet with severe MR eccentrically  directed anteriorly and wraps around the LA into the left upper pulmonary  vein. There is systolic flow reversal  in the pulmonary vein. The mitral valve is normal in structure. Severe  mitral valve regurgitation, with eccentric anteriorly directed jet. No  evidence of mitral valve stenosis.   Tricuspid Valve: The tricuspid valve is normal in structure. Tricuspid  valve regurgitation is trivial. No evidence of tricuspid stenosis.   Aortic Valve: The aortic valve is normal in structure. Aortic valve  regurgitation is not visualized. No aortic stenosis is present.   Pulmonic Valve: The pulmonic valve was normal in structure. Pulmonic valve  regurgitation is trivial. No evidence of pulmonic stenosis.   Aorta: The aortic root is normal in size and structure.   IAS/Shunts: No atrial level shunt detected by color flow Doppler. Agitated  saline contrast was given intravenously to evaluate for intracardiac  shunting.   Fransico Him MD  Electronically signed by Fransico Him MD  Signature Date/Time: 03/19/2021/4:47:41 PM         RIGHT/LEFT HEART CATH AND CORONARY ANGIOGRAPHY      Conclusion     Hemodynamic findings consistent with mitral valve regurgitation.   [name] [name] is a 75 y.o. female      [id number] LOCATION:  FACILITY: [facility] PHYSICIAN: Quay Burow, M.D. 1946/06/29     DATE OF PROCEDURE:  03/14/2021   DATE OF DISCHARGE:        CARDIAC CATHETERIZATION        History obtained from chart review. [name] [name] is a  89 -year-old married Caucasian female mother of 3 children, grandmother to 2 grandchildren, patient Dr. Billey Gosling who saw Dr.  Sallyanne Kuster remotely.  She is accompanied by her husband [name] [name].  She was referred for atypical chest pain.  I last saw her in the office 02/09/2015.  Her cardiac risk factor profile is notable for treated hypertension and mild hyperlipidemia. She has never had a heart attack or stroke. She is otherwise healthy except for GERD. She doesn't saw Dr. Sallyanne Kuster back in 2011 for atypical chest pain and workup was negative including a Myoview stress test. She was recently out of the country for several months and returned one month ago. Since that time she's had daily chest pain. She was under a lot of stress and she was away the pain itself sounds like GERD, begin subxiphoid and has no other characteristic symptoms of angina.   Since I saw her 2 years ago she is complained of increasing dyspnea on exertion.  She did have a negative Myoview stress test.  Recent 2D echo revealed severe MR.  She presents now for same-day transesophageal echo followed by right and left heart cath (radial/brachial) in anticipation of "mini mitral" repair.     IMPRESSION: [name]. Quizon has a flail posterior leaflet with severe MR, clean coronary arteries with normal LV function and a elevated V wave.  She is symptomatic.  She is a great candidate for a "mini mitral" repair.  I am referring her to Dr. Lilly Cove for consideration of this.  The radial sheath was removed and a TR band was placed on the right wrist to achieve patent hemostasis.  The patient left lab in stable condition.  She will be discharged home later [name] as an outpatient and will follow up with me in the office next week to discuss her options and timing.   Quay Burow. MD, [facility] 03/14/2021 11:32 AM           Surgeon Notes       03/14/2021  8:42 AM CV Procedure signed by Sueanne Margarita, MD      Indications   Severe mitral regurgitation [I34.0 (ICD-10-CM)]    Procedural Details   Technical Details PROCEDURE DESCRIPTION:   The patient was brought  to the second floor [facility] cath lab in the postabsorptive state.  She was notpremedicated .  Her right wrist and antecubital fossaWere prepped and shaved in usual sterile fashion. Xylocaine 1% was used for local anesthesia. A 6 French sheath was inserted into the right radial artery using standard Seldinger technique.  A 7 French sheath was inserted into the right antecubital vein.  A 7 [place] balloontipped fibrillation Swan-Ganz catheter was then advanced through the right heart chambers obtain sequential pressures and pulmonary blood samples for the determination of Fick cardiac output.  5 [place] TIG catheter and pigtail catheters were used for selective coronary angiography and obtaining left heart pressures.  Isovue dye was used for the entirety of the case.  Retrograde aortic, ventricular and pullback pressures were recorded.  The patient did receive 3000 units of IV heparin.  Radial cocktail was administered through the SideArm sheath. Estimated blood loss <50 mL.   During this procedure no sedation was administered.    Medications (Filter: Administrations occurring from 1030 to 1127 on 03/14/21)   Heparin (Porcine) in NaCl 1000-0.9 UT/500ML-% SOLN (mL) Total volume:  1,500 mL   Date/Time Rate/Dose/Volume Action    03/14/21 1044 500 mL Given    1044 500 mL Given    1044 500 mL Given      lidocaine (PF) (XYLOCAINE) 1 % injection (mL) Total volume:  3 mL   Date/Time Rate/Dose/Volume Action    03/14/21 1103 1 mL Given  1103 2 mL Given      heparin sodium (porcine) injection (Units) Total dose:  3,000 Units   Date/Time Rate/Dose/Volume Action    03/14/21 1111 3,000 Units Given      Radial Cocktail (Verapamil 2.5 mg, NTG, Lidocaine) (mL) Total volume:  5 mL   Date/Time Rate/Dose/Volume Action    03/14/21 1109 5 mL Given      iohexol (OMNIPAQUE) 350 MG/ML injection (mL) Total volume:  70 mL   Date/Time Rate/Dose/Volume Action    03/14/21 1121 70 mL Given         Contrast   Medication Name Total Dose  iohexol (OMNIPAQUE) 350 MG/ML injection 70 mL      Radiation/Fluoro   Fluoro time: 3 (min) DAP: 8.1 (Gycm2) Cumulative Air Kerma: 5.6 (mGy)     Coronary Findings     Diagnostic Dominance: Right   No diagnostic findings have been documented.   Intervention     No interventions have been documented.   Right Heart   Right Heart Pressures Hemodynamic findings consistent with mitral valve regurgitation. 1: Right atrial pressure-9/3 2: Right ventricular pressure- 46/0 3: Pulmonary artery pressure-44/15, mean 27 4: Pulmonary wedge pressure- 20/30, mean 19 5: LVEDP-11 6: Cardiac output 5.5 L/min with an index of 3.4 L/min/m    Coronary Diagrams     Diagnostic Dominance: Right      Intervention       Implants      No implant documentation for this case.      Syngo Images    Show images for CARDIAC CATHETERIZATION   Images on Long Term Storage    Show images for [name] [name], [name] "Jana Half L"   Link to Procedure Log   Procedure Log      Hemo Data   Flowsheet Row Most Recent Value  Fick Cardiac Output 5.54 L/min  Fick Cardiac Output Index 3.39 (L/min)/BSA  RA A Wave 9 mmHg  RA V Wave 3 mmHg  RA Mean 3 mmHg  RV Systolic Pressure 46 mmHg  RV Diastolic Pressure 0 mmHg  RV EDP 4 mmHg  PA Systolic Pressure 44 mmHg  PA Diastolic Pressure 15 mmHg  PA Mean 27 mmHg  PW A Wave 20 mmHg  PW V Wave 30 mmHg  PW Mean 19 mmHg  AO Systolic Pressure 465 mmHg  AO Diastolic Pressure 66 mmHg  AO Mean 92 mmHg  LV Systolic Pressure 681 mmHg  LV Diastolic Pressure 5 mmHg  LV EDP 13 mmHg  AOp Systolic Pressure 275 mmHg  AOp Diastolic Pressure 68 mmHg  AOp Mean Pressure 94 mmHg  LVp Systolic Pressure 170 mmHg  LVp Diastolic Pressure 3 mmHg  LVp EDP Pressure 10 mmHg  QP/QS 1  TPVR Index 7.96 HRUI  TSVR Index 27.13 HRUI  PVR SVR Ratio 0.09  TPVR/TSVR Ratio 0.29     CT ANGIOGRAPHY CHEST WITH CONTRAST    TECHNIQUE: Multidetector CT imaging of the chest was performed using the standard protocol during bolus administration of intravenous contrast. Multiplanar CT image reconstructions and MIPs were obtained to evaluate the vascular anatomy.   CONTRAST:  174mL OMNIPAQUE IOHEXOL 350 MG/ML SOLN   COMPARISON:  None.   FINDINGS: Cardiovascular: Satisfactory opacification of the pulmonary arteries to the segmental level. No evidence of pulmonary embolism. Cardiomegaly. No pericardial effusion.   Mediastinum/Nodes: No enlarged mediastinal, hilar, or axillary lymph nodes. Thyroid gland, trachea, and esophagus demonstrate no significant findings.   Lungs/Pleura: Moderate right, small left pleural effusion associated atelectasis or consolidation. Scattered  ground-glass airspace opacity throughout the lungs, particularly in the right upper lobe series 4, image 24).   Upper Abdomen: No acute abnormality.   Musculoskeletal: No chest wall abnormality. No acute or significant osseous findings.   Review of the MIP images confirms the above findings.   IMPRESSION: 1. Negative examination for pulmonary embolism. 2. Moderate right, small left pleural effusions with associated atelectasis or consolidation. 3. Scattered ground-glass airspace opacity throughout the lungs, particularly in the right upper lobe, nonspecific and infectious or inflammatory. 4. Cardiomegaly.     Electronically Signed   By: Eddie Candle M.D.   On: 02/02/2021 12:49   CT ABDOMEN AND PELVIS WITH CONTRAST   TECHNIQUE: Multidetector CT imaging of the abdomen and pelvis was performed using the standard protocol following bolus administration of intravenous contrast.   CONTRAST:  163mL OMNIPAQUE IOHEXOL 350 MG/ML SOLN   COMPARISON:  05/02/2020   FINDINGS: Lower chest: Moderate right, small left pleural effusions and associated atelectasis or consolidation.   Hepatobiliary: No solid liver abnormality is seen.  Multiple low-attenuation cysts or hemangiomata of the liver. No gallstones, gallbladder wall thickening, or biliary dilatation.   Pancreas: Unremarkable. No pancreatic ductal dilatation or surrounding inflammatory changes.   Spleen: Normal in size without significant abnormality.   Adrenals/Urinary Tract: Adrenal glands are unremarkable. Benign left renal cysts. Kidneys are otherwise normal, without renal calculi, solid lesion, or hydronephrosis. Bladder is unremarkable.   Stomach/Bowel: Stomach is within normal limits. Appendix is not clearly visualized and may be surgically absent. No evidence of bowel wall thickening, distention, or inflammatory changes.   Vascular/Lymphatic: Aortic atherosclerosis. No enlarged abdominal or pelvic lymph nodes.   Reproductive: No mass or other significant abnormality.   Other: No abdominal wall hernia or abnormality. No abdominopelvic ascites.   Musculoskeletal: No acute or significant osseous findings.   IMPRESSION: 1. No acute CT findings of the abdomen or pelvis to explain abdominal pain. 2. Moderate right, small left pleural effusions and associated atelectasis or consolidation, separately reported on examination of the chest. 3. Aortic atherosclerosis.   Aortic Atherosclerosis (ICD10-I70.0).     Electronically Signed   By: Eddie Candle M.D.   On: 02/02/2021 12:54    CHEST - 2 VIEW   COMPARISON:  February 26, 2021.   FINDINGS: Trachea is midline. Cardiomediastinal contours and hilar structures with cardiomegaly as before and with signs of mitral annular calcification. Lungs are clear. No sign of pneumothorax. No pleural effusion.   On limited assessment no acute skeletal process.   IMPRESSION: Cardiomegaly with mitral annular calcification.   No radiographic signs of acute cardiopulmonary disease.     Electronically Signed   By: Zetta Bills M.D.   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  She describes a 2-year history of progressive symptoms of exertional shortness of breath and fatigue consistent with chronic diastolic congestive heart failure which has recently improved on medical therapy, currently [place] Heart Association functional class II.   I have personally reviewed the patient's recent echocardiograms and diagnostic cardiac catheterization.  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  Left ventricular systolic function appears reasonably well-preserved.  There is moderate left atrial enlargement.  Diagnostic cardiac catheterization is notable of the absence of significant coronary artery  disease and reveals moderate pulmonary hypertension.  I agree the patient needs mitral valve repair.  Risks associated with conventional surgery will be slightly elevated only because of the patient's age.     Plan:   The patient and husband were again counseled at length regarding her diagnosis of severe primary mitral regurgitation.  We reviewed the results of their diagnostic tests including images from the most recent echocardiogram.  We discussed the natural history of mitral regurgitation as well as alternative treatment strategies.  We discussed the impact of her age, current state of health, and any significant comorbid medical problems on clinical decision making.  We went on to discuss the indications, risks and potential benefits of mitral valve repair as well as the timing of surgical intervention.  The rationale for elective surgery has been explained, including a comparison between surgery and continued medical therapy with close follow-up.  The likelihood of successful and durable  mitral valve repair has been discussed with particular reference to the findings of the most recent echocardiogram.  Based upon these findings and previous experience, I have quoted a greater than 98 percent likelihood of successful valve repair with less than 2 percent risk of mortality or major morbidity.  Alternative  surgical approaches have been discussed including a comparison between conventional sternotomy and minimally-invasive techniques.  Transcatheter edge-to-edge repair using Mitraclip was discussed as an alternative to conventional surgery that is typically reserved for patients who are felt to be at relatively high risk.  The relative risks and benefits of each have been reviewed as they pertain to the patient's specific circumstances, and expectations for the patient's postoperative convalescence has been discussed.   The patient understands and accepts all potential risks of surgery including but not limited to risk of death, stroke or other neurologic complication, myocardial infarction, congestive heart failure, respiratory failure, renal failure, bleeding requiring transfusion and/or reexploration, arrhythmia, heart block or bradycardia requiring permanent pacemaker insertion, infection or other wound complications, pneumonia, pleural and/or pericardial effusion, pulmonary embolus, aortic dissection or other major vascular complication, or other immediate or delayed complications related to valve repair or replacement including but not limited to recurrent or persistent mitral regurgitation and/or mitral stenosis, LV outflow tract obstruction, aortic insufficiency, paravalvular leak, posterior AV groove disruption, structural valve deterioration and failure, thrombosis, embolization, or endocarditis.  Specific risks potentially related to the minimally-invasive approach were discussed at length, including but not limited to risk of conversion to full or partial sternotomy, aortic dissection or  other major vascular complication, unilateral acute lung injury or pulmonary edema, phrenic nerve dysfunction or paralysis, rib fracture, chronic pain, lung hernia, or lymphocele. All of her questions have been answered.               Valentina Gu. Roxy Manns, MD 05/20/2021 4:26 PM

## 2021-05-20 NOTE — Anesthesia Preprocedure Evaluation (Addendum)
Anesthesia Evaluation  Patient identified by MRN, date of birth, ID band Patient awake    Reviewed: Allergy & Precautions, NPO status , Patient's Chart, lab work & pertinent test results  Airway Mallampati: II  TM Distance: >3 FB Neck ROM: Full    Dental  (+) Teeth Intact   Pulmonary    Pulmonary exam normal        Cardiovascular hypertension, Pt. on medications and Pt. on home beta blockers + DOE  + Valvular Problems/Murmurs MR  Rhythm:Irregular Rate:Normal + Systolic murmurs    Neuro/Psych  Headaches, negative psych ROS   GI/Hepatic Neg liver ROS, hiatal hernia, GERD  ,  Endo/Other  negative endocrine ROS  Renal/GU   negative genitourinary   Musculoskeletal  (+) Arthritis ,   Abdominal (+)  Abdomen: soft. Bowel sounds: normal.  Peds  Hematology negative hematology ROS (+)   Anesthesia Other Findings   Reproductive/Obstetrics                            Anesthesia Physical Anesthesia Plan  ASA: 4  Anesthesia Plan: General   Post-op Pain Management:    Induction: Intravenous  PONV Risk Score and Plan: 3 and Ondansetron, Dexamethasone, Midazolam and Treatment may vary due to age or medical condition  Airway Management Planned: Mask and Double Lumen EBT  Additional Equipment: Arterial line, CVP, PA Cath, TEE, 3D TEE and Ultrasound Guidance Line Placement  Intra-op Plan:   Post-operative Plan: Possible Post-op intubation/ventilation  Informed Consent: I have reviewed the patients History and Physical, chart, labs and discussed the procedure including the risks, benefits and alternatives for the proposed anesthesia with the patient or authorized representative who has indicated his/her understanding and acceptance.     Dental advisory given  Plan Discussed with: CRNA  Anesthesia Plan Comments: (Lab Results      Component                Value               Date                       WBC                      8.2                 05/17/2021                HGB                      13.8                05/17/2021                HCT                      41.9                05/17/2021                MCV                      99.1                05/17/2021                PLT  199                 05/17/2021           Lab Results      Component                Value               Date                      NA                       136                 05/17/2021                K                        4.1                 05/17/2021                CO2                      25                  05/17/2021                GLUCOSE                  101 (H)             05/17/2021                BUN                      24 (H)              05/17/2021                CREATININE               0.85                05/17/2021                CALCIUM                  9.4                 05/17/2021                GFRNONAA                 >60                 05/17/2021           ECHO 04/22: 1. Left ventricular ejection fraction, by estimation, is 60 to 65%. The  left ventricle has normal function. The left ventricle has no regional  wall motion abnormalities.  2. Right ventricular systolic function is normal. The right ventricular  size is normal.  3. Left atrial size was moderately dilated. No left atrial/left atrial  appendage thrombus was detected. The LAA emptying velocity was 69 cm/s.  4. There is mild prolapse of the anterior mitral valve leaflet and  partially flail posterior leaflet with severe MR eccentrically directed  anteriorly and wraps around the LA  into the left upper pulmonary vein.  There is systolic flow reversal in the  pulmonary vein. . The mitral valve is normal in structure. Severe mitral  valve regurgitation. No evidence of mitral stenosis.  5. The aortic valve is normal in structure. Aortic valve regurgitation is  not visualized. No aortic  stenosis is present. )       Anesthesia Quick Evaluation

## 2021-05-20 NOTE — Brief Op Note (Deleted)
05/21/2021  12:14 PM  PATIENT:  [name] [name]  75 y.o. female  PRE-OPERATIVE DIAGNOSIS:  SEVERE MITRAL VALVE INSUFFICIENCY, HISTORY OF ATRIAL FIBRILLATION  POST-OPERATIVE DIAGNOSIS:  SEVERE MITRAL VALVE INSUFFICIENCY, HISTORY OF ATRIAL FIBRILLATION  PROCEDURE:   -MINIMALLY INVASIVE MITRAL VALVE REPAIR WITH PLACEMENT OF PTFE NEO-CHORDS X 4 TO THE P2 SEGMENT AND 36MM RING ANNULOPLASTY  -CLIPPING OF ATRIAL APPENDAGE   -TRANSESOPHAGEAL ECHOCARDIOGRAM   SURGEON:  Rexene Alberts, MD - Primary  PHYSICIAN ASSISTANT: Bren Steers  ASSISTANTS: D. Acuna  ANESTHESIA:   general  EBL:  per anesthesia and perfusion records  BLOOD ADMINISTERED:none  DRAINS:  right pleural and mediastinal drains    LOCAL MEDICATIONS USED:  NONE  SPECIMEN:  No Specimen  DISPOSITION OF SPECIMEN:  N/A  COUNTS:  YES  DICTATION: .Dragon Dictation  PLAN OF CARE: Admit to inpatient   PATIENT DISPOSITION:  ICU - extubated and stable.   Delay start of Pharmacological VTE agent (>24hrs) due to surgical blood loss or risk of bleeding: yes

## 2021-05-20 NOTE — Patient Instructions (Signed)
  Have nothing to eat or drink after midnight the night before surgery.  On the morning of surgery do not take any medications.  At your appointment for Pre-Admission Testing at the [facility] Short-Stay Department you will be asked to sign permission forms for your upcoming surgery.  By definition your signature on these forms implies that you and/or your designee provide full informed consent for your planned surgical procedure(s), that alternative treatment options have been discussed, that you understand and accept any and all potential risks, and that you have some understanding of what to expect for your post-operative convalescence.  For elective mitral valve repair or replacement potential operative risks include but are not limited to at least some risk of death, stroke or other neurologic complication, myocardial infarction, congestive heart failure, respiratory failure, renal failure, bleeding requiring transfusion and/or reexploration, arrhythmia, heart block or bradycardia requiring permanent pacemaker insertion, infection or other wound complications, pneumonia, pleural and/or pericardial effusion, pulmonary embolus, aortic dissection or other major vascular complication, or other immediate or delayed complications related to valve repair or replacement including but not limited to recurrent or persistent mitral regurgitation and/or mitral stenosis, LV outflow tract obstruction, aortic insufficiency, paravalvular leak, posterior AV groove disruption, late structural valve deterioration and failure, thrombosis, embolization, or endocarditis.  Specific risks potentially related to the minimally-invasive approach include but are not limited to risk of conversion to full or partial sternotomy, aortic dissection or other major vascular complication, unilateral acute lung injury or pulmonary edema, phrenic nerve dysfunction or paralysis, rib fracture, chronic pain, lung hernia, or  lymphocele.  Please call to schedule a follow-up appointment in our office prior to surgery if you have any unresolved questions about your planned surgical procedure, the associated risks, alternative treatment options, and/or expectations for your post-operative recovery.

## 2021-05-21 ENCOUNTER — Inpatient Hospital Stay (HOSPITAL_COMMUNITY)
Admission: RE | Admit: 2021-05-21 | Discharge: 2021-05-26 | DRG: 219 | Disposition: A | Discharge status: Home / Self Care | Payer: PPO | Attending: Thoracic Surgery (Cardiothoracic Vascular Surgery) | Admitting: Thoracic Surgery (Cardiothoracic Vascular Surgery)

## 2021-05-21 ENCOUNTER — Inpatient Hospital Stay (HOSPITAL_COMMUNITY): Payer: PPO | Admitting: Vascular Surgery

## 2021-05-21 ENCOUNTER — Inpatient Hospital Stay (HOSPITAL_COMMUNITY): Payer: PPO | Admitting: Anesthesiology

## 2021-05-21 ENCOUNTER — Encounter (HOSPITAL_COMMUNITY): Payer: Self-pay | Admitting: Thoracic Surgery (Cardiothoracic Vascular Surgery)

## 2021-05-21 ENCOUNTER — Inpatient Hospital Stay (HOSPITAL_COMMUNITY): Payer: PPO

## 2021-05-21 ENCOUNTER — Encounter (HOSPITAL_COMMUNITY)
Admission: RE | Disposition: A | Discharge status: Home / Self Care | Payer: Self-pay | Source: Home / Self Care | Attending: Thoracic Surgery (Cardiothoracic Vascular Surgery)

## 2021-05-21 DIAGNOSIS — Z006 Encounter for examination for normal comparison and control in clinical research program: Secondary | ICD-10-CM | POA: Diagnosis not present

## 2021-05-21 DIAGNOSIS — E785 Hyperlipidemia, unspecified: Secondary | ICD-10-CM | POA: Diagnosis not present

## 2021-05-21 DIAGNOSIS — R7303 Prediabetes: Secondary | ICD-10-CM | POA: Diagnosis present

## 2021-05-21 DIAGNOSIS — E876 Hypokalemia: Secondary | ICD-10-CM | POA: Diagnosis present

## 2021-05-21 DIAGNOSIS — I341 Nonrheumatic mitral (valve) prolapse: Secondary | ICD-10-CM | POA: Diagnosis present

## 2021-05-21 DIAGNOSIS — Z7901 Long term (current) use of anticoagulants: Secondary | ICD-10-CM | POA: Diagnosis not present

## 2021-05-21 DIAGNOSIS — Z823 Family history of stroke: Secondary | ICD-10-CM

## 2021-05-21 DIAGNOSIS — I110 Hypertensive heart disease with heart failure: Secondary | ICD-10-CM | POA: Diagnosis not present

## 2021-05-21 DIAGNOSIS — I511 Rupture of chordae tendineae, not elsewhere classified: Secondary | ICD-10-CM | POA: Diagnosis present

## 2021-05-21 DIAGNOSIS — I10 Essential (primary) hypertension: Secondary | ICD-10-CM | POA: Diagnosis present

## 2021-05-21 DIAGNOSIS — Z8051 Family history of malignant neoplasm of kidney: Secondary | ICD-10-CM | POA: Diagnosis not present

## 2021-05-21 DIAGNOSIS — I5032 Chronic diastolic (congestive) heart failure: Secondary | ICD-10-CM | POA: Diagnosis not present

## 2021-05-21 DIAGNOSIS — M170 Bilateral primary osteoarthritis of knee: Secondary | ICD-10-CM | POA: Diagnosis present

## 2021-05-21 DIAGNOSIS — I493 Ventricular premature depolarization: Secondary | ICD-10-CM | POA: Diagnosis not present

## 2021-05-21 DIAGNOSIS — Z20822 Contact with and (suspected) exposure to covid-19: Secondary | ICD-10-CM | POA: Diagnosis present

## 2021-05-21 DIAGNOSIS — I509 Heart failure, unspecified: Secondary | ICD-10-CM | POA: Diagnosis not present

## 2021-05-21 DIAGNOSIS — J9811 Atelectasis: Secondary | ICD-10-CM | POA: Diagnosis not present

## 2021-05-21 DIAGNOSIS — I350 Nonrheumatic aortic (valve) stenosis: Secondary | ICD-10-CM | POA: Diagnosis not present

## 2021-05-21 DIAGNOSIS — Z79899 Other long term (current) drug therapy: Secondary | ICD-10-CM

## 2021-05-21 DIAGNOSIS — K589 Irritable bowel syndrome without diarrhea: Secondary | ICD-10-CM | POA: Diagnosis present

## 2021-05-21 DIAGNOSIS — D62 Acute posthemorrhagic anemia: Secondary | ICD-10-CM | POA: Diagnosis not present

## 2021-05-21 DIAGNOSIS — M810 Age-related osteoporosis without current pathological fracture: Secondary | ICD-10-CM | POA: Diagnosis present

## 2021-05-21 DIAGNOSIS — G43909 Migraine, unspecified, not intractable, without status migrainosus: Secondary | ICD-10-CM | POA: Diagnosis not present

## 2021-05-21 DIAGNOSIS — Z8249 Family history of ischemic heart disease and other diseases of the circulatory system: Secondary | ICD-10-CM

## 2021-05-21 DIAGNOSIS — K2970 Gastritis, unspecified, without bleeding: Secondary | ICD-10-CM | POA: Diagnosis present

## 2021-05-21 DIAGNOSIS — R531 Weakness: Secondary | ICD-10-CM | POA: Diagnosis not present

## 2021-05-21 DIAGNOSIS — I480 Paroxysmal atrial fibrillation: Secondary | ICD-10-CM | POA: Diagnosis present

## 2021-05-21 DIAGNOSIS — J939 Pneumothorax, unspecified: Secondary | ICD-10-CM | POA: Diagnosis not present

## 2021-05-21 DIAGNOSIS — K219 Gastro-esophageal reflux disease without esophagitis: Secondary | ICD-10-CM | POA: Diagnosis present

## 2021-05-21 DIAGNOSIS — I340 Nonrheumatic mitral (valve) insufficiency: Secondary | ICD-10-CM

## 2021-05-21 DIAGNOSIS — D696 Thrombocytopenia, unspecified: Secondary | ICD-10-CM | POA: Diagnosis not present

## 2021-05-21 DIAGNOSIS — I517 Cardiomegaly: Secondary | ICD-10-CM | POA: Diagnosis not present

## 2021-05-21 DIAGNOSIS — I700 Atherosclerosis of aorta: Secondary | ICD-10-CM | POA: Diagnosis not present

## 2021-05-21 DIAGNOSIS — I5020 Unspecified systolic (congestive) heart failure: Secondary | ICD-10-CM

## 2021-05-21 DIAGNOSIS — Z820 Family history of epilepsy and other diseases of the nervous system: Secondary | ICD-10-CM

## 2021-05-21 DIAGNOSIS — R0609 Other forms of dyspnea: Secondary | ICD-10-CM | POA: Diagnosis present

## 2021-05-21 DIAGNOSIS — K449 Diaphragmatic hernia without obstruction or gangrene: Secondary | ICD-10-CM | POA: Diagnosis present

## 2021-05-21 DIAGNOSIS — I088 Other rheumatic multiple valve diseases: Secondary | ICD-10-CM | POA: Diagnosis not present

## 2021-05-21 DIAGNOSIS — J90 Pleural effusion, not elsewhere classified: Secondary | ICD-10-CM | POA: Diagnosis not present

## 2021-05-21 DIAGNOSIS — Z9889 Other specified postprocedural states: Secondary | ICD-10-CM

## 2021-05-21 HISTORY — PX: CLIPPING OF ATRIAL APPENDAGE: SHX5773

## 2021-05-21 HISTORY — DX: Other specified postprocedural states: Z98.890

## 2021-05-21 HISTORY — PX: TEE WITHOUT CARDIOVERSION: SHX5443

## 2021-05-21 HISTORY — PX: MITRAL VALVE REPAIR: SHX2039

## 2021-05-21 LAB — POCT I-STAT, CHEM 8
BUN: 17 mg/dL (ref 8–23)
BUN: 17 mg/dL (ref 8–23)
BUN: 15 mg/dL (ref 8–23)
BUN: 14 mg/dL (ref 8–23)
BUN: 14 mg/dL (ref 8–23)
Calcium, Ion: 1.34 mmol/L (ref 1.15–1.40)
Calcium, Ion: 1.25 mmol/L (ref 1.15–1.40)
Calcium, Ion: 0.96 mmol/L — ABNORMAL LOW (ref 1.15–1.40)
Calcium, Ion: 0.94 mmol/L — ABNORMAL LOW (ref 1.15–1.40)
Calcium, Ion: 0.95 mmol/L — ABNORMAL LOW (ref 1.15–1.40)
Chloride: 107 mmol/L (ref 98–111)
Chloride: 105 mmol/L (ref 98–111)
Chloride: 104 mmol/L (ref 98–111)
Chloride: 105 mmol/L (ref 98–111)
Chloride: 105 mmol/L (ref 98–111)
Creatinine, Ser: 0.6 mg/dL (ref 0.44–1.00)
Creatinine, Ser: 0.5 mg/dL (ref 0.44–1.00)
Creatinine, Ser: 0.5 mg/dL (ref 0.44–1.00)
Creatinine, Ser: 0.4 mg/dL — ABNORMAL LOW (ref 0.44–1.00)
Creatinine, Ser: 0.5 mg/dL (ref 0.44–1.00)
Glucose, Bld: 121 mg/dL — ABNORMAL HIGH (ref 70–99)
Glucose, Bld: 170 mg/dL — ABNORMAL HIGH (ref 70–99)
Glucose, Bld: 153 mg/dL — ABNORMAL HIGH (ref 70–99)
Glucose, Bld: 145 mg/dL — ABNORMAL HIGH (ref 70–99)
Glucose, Bld: 166 mg/dL — ABNORMAL HIGH (ref 70–99)
HCT: 36 % (ref 36.0–46.0)
HCT: 42 % (ref 36.0–46.0)
HCT: 32 % — ABNORMAL LOW (ref 36.0–46.0)
HCT: 26 % — ABNORMAL LOW (ref 36.0–46.0)
HCT: 27 % — ABNORMAL LOW (ref 36.0–46.0)
Hemoglobin: 12.2 g/dL (ref 12.0–15.0)
Hemoglobin: 14.3 g/dL (ref 12.0–15.0)
Hemoglobin: 10.9 g/dL — ABNORMAL LOW (ref 12.0–15.0)
Hemoglobin: 8.8 g/dL — ABNORMAL LOW (ref 12.0–15.0)
Hemoglobin: 9.2 g/dL — ABNORMAL LOW (ref 12.0–15.0)
Potassium: 4 mmol/L (ref 3.5–5.1)
Potassium: 3.2 mmol/L — ABNORMAL LOW (ref 3.5–5.1)
Potassium: 4 mmol/L (ref 3.5–5.1)
Potassium: 3.8 mmol/L (ref 3.5–5.1)
Potassium: 4.3 mmol/L (ref 3.5–5.1)
Sodium: 141 mmol/L (ref 135–145)
Sodium: 141 mmol/L (ref 135–145)
Sodium: 141 mmol/L (ref 135–145)
Sodium: 142 mmol/L (ref 135–145)
Sodium: 143 mmol/L (ref 135–145)
TCO2: 28 mmol/L (ref 22–32)
TCO2: 26 mmol/L (ref 22–32)
TCO2: 26 mmol/L (ref 22–32)
TCO2: 22 mmol/L (ref 22–32)
TCO2: 26 mmol/L (ref 22–32)

## 2021-05-21 LAB — POCT I-STAT 7, (LYTES, BLD GAS, ICA,H+H)
Acid-Base Excess: 0 mmol/L (ref 0.0–2.0)
Acid-Base Excess: 0 mmol/L (ref 0.0–2.0)
Acid-Base Excess: 0 mmol/L (ref 0.0–2.0)
Acid-base deficit: 3 mmol/L — ABNORMAL HIGH (ref 0.0–2.0)
Bicarbonate: 27.3 mmol/L (ref 20.0–28.0)
Bicarbonate: 23.8 mmol/L (ref 20.0–28.0)
Bicarbonate: 25.4 mmol/L (ref 20.0–28.0)
Bicarbonate: 21.3 mmol/L (ref 20.0–28.0)
Calcium, Ion: 1.32 mmol/L (ref 1.15–1.40)
Calcium, Ion: 0.84 mmol/L — CL (ref 1.15–1.40)
Calcium, Ion: 0.93 mmol/L — ABNORMAL LOW (ref 1.15–1.40)
Calcium, Ion: 0.93 mmol/L — ABNORMAL LOW (ref 1.15–1.40)
HCT: 37 % (ref 36.0–46.0)
HCT: 28 % — ABNORMAL LOW (ref 36.0–46.0)
HCT: 28 % — ABNORMAL LOW (ref 36.0–46.0)
HCT: 27 % — ABNORMAL LOW (ref 36.0–46.0)
Hemoglobin: 12.6 g/dL (ref 12.0–15.0)
Hemoglobin: 9.5 g/dL — ABNORMAL LOW (ref 12.0–15.0)
Hemoglobin: 9.5 g/dL — ABNORMAL LOW (ref 12.0–15.0)
Hemoglobin: 9.2 g/dL — ABNORMAL LOW (ref 12.0–15.0)
O2 Saturation: 100 %
O2 Saturation: 100 %
O2 Saturation: 100 %
O2 Saturation: 99 %
Potassium: 4 mmol/L (ref 3.5–5.1)
Potassium: 3.4 mmol/L — ABNORMAL LOW (ref 3.5–5.1)
Potassium: 3.8 mmol/L (ref 3.5–5.1)
Potassium: 3.8 mmol/L (ref 3.5–5.1)
Sodium: 141 mmol/L (ref 135–145)
Sodium: 144 mmol/L (ref 135–145)
Sodium: 143 mmol/L (ref 135–145)
Sodium: 142 mmol/L (ref 135–145)
TCO2: 29 mmol/L (ref 22–32)
TCO2: 25 mmol/L (ref 22–32)
TCO2: 27 mmol/L (ref 22–32)
TCO2: 22 mmol/L (ref 22–32)
pCO2 arterial: 52.7 mmHg — ABNORMAL HIGH (ref 32.0–48.0)
pCO2 arterial: 36.3 mmHg (ref 32.0–48.0)
pCO2 arterial: 42 mmHg (ref 32.0–48.0)
pCO2 arterial: 32.7 mmHg (ref 32.0–48.0)
pH, Arterial: 7.322 — ABNORMAL LOW (ref 7.350–7.450)
pH, Arterial: 7.424 (ref 7.350–7.450)
pH, Arterial: 7.39 (ref 7.350–7.450)
pH, Arterial: 7.423 (ref 7.350–7.450)
pO2, Arterial: 532 mmHg — ABNORMAL HIGH (ref 83.0–108.0)
pO2, Arterial: 371 mmHg — ABNORMAL HIGH (ref 83.0–108.0)
pO2, Arterial: 331 mmHg — ABNORMAL HIGH (ref 83.0–108.0)
pO2, Arterial: 132 mmHg — ABNORMAL HIGH (ref 83.0–108.0)

## 2021-05-21 LAB — GLUCOSE, CAPILLARY
Glucose-Capillary: 130 mg/dL — ABNORMAL HIGH (ref 70–99)
Glucose-Capillary: 138 mg/dL — ABNORMAL HIGH (ref 70–99)
Glucose-Capillary: 142 mg/dL — ABNORMAL HIGH (ref 70–99)
Glucose-Capillary: 114 mg/dL — ABNORMAL HIGH (ref 70–99)
Glucose-Capillary: 169 mg/dL — ABNORMAL HIGH (ref 70–99)
Glucose-Capillary: 188 mg/dL — ABNORMAL HIGH (ref 70–99)
Glucose-Capillary: 167 mg/dL — ABNORMAL HIGH (ref 70–99)
Glucose-Capillary: 171 mg/dL — ABNORMAL HIGH (ref 70–99)
Glucose-Capillary: 163 mg/dL — ABNORMAL HIGH (ref 70–99)

## 2021-05-21 LAB — MAGNESIUM: Magnesium: 3.3 mg/dL — ABNORMAL HIGH (ref 1.7–2.4)

## 2021-05-21 LAB — POCT I-STAT EG7
Acid-Base Excess: 1 mmol/L (ref 0.0–2.0)
Bicarbonate: 25.4 mmol/L (ref 20.0–28.0)
Calcium, Ion: 0.99 mmol/L — ABNORMAL LOW (ref 1.15–1.40)
HCT: 31 % — ABNORMAL LOW (ref 36.0–46.0)
Hemoglobin: 10.5 g/dL — ABNORMAL LOW (ref 12.0–15.0)
O2 Saturation: 79 %
Potassium: 3.2 mmol/L — ABNORMAL LOW (ref 3.5–5.1)
Sodium: 144 mmol/L (ref 135–145)
TCO2: 27 mmol/L (ref 22–32)
pCO2, Ven: 40.1 mmHg — ABNORMAL LOW (ref 44.0–60.0)
pH, Ven: 7.41 (ref 7.250–7.430)
pO2, Ven: 43 mmHg (ref 32.0–45.0)

## 2021-05-21 LAB — HEMOGLOBIN AND HEMATOCRIT, BLOOD
HCT: 29.6 % — ABNORMAL LOW (ref 36.0–46.0)
Hemoglobin: 9.8 g/dL — ABNORMAL LOW (ref 12.0–15.0)

## 2021-05-21 LAB — CBC
HCT: 30.1 % — ABNORMAL LOW (ref 36.0–46.0)
Hemoglobin: 9.9 g/dL — ABNORMAL LOW (ref 12.0–15.0)
MCH: 32.8 pg (ref 26.0–34.0)
MCHC: 32.9 g/dL (ref 30.0–36.0)
MCV: 99.7 fL (ref 80.0–100.0)
Platelets: 108 10*3/uL — ABNORMAL LOW (ref 150–400)
RBC: 3.02 MIL/uL — ABNORMAL LOW (ref 3.87–5.11)
RDW: 12.8 % (ref 11.5–15.5)
WBC: 13.3 10*3/uL — ABNORMAL HIGH (ref 4.0–10.5)
nRBC: 0 % (ref 0.0–0.2)

## 2021-05-21 LAB — BASIC METABOLIC PANEL
Anion gap: 7 (ref 5–15)
BUN: 11 mg/dL (ref 8–23)
CO2: 22 mmol/L (ref 22–32)
Calcium: 6.9 mg/dL — ABNORMAL LOW (ref 8.9–10.3)
Chloride: 111 mmol/L (ref 98–111)
Creatinine, Ser: 0.74 mg/dL (ref 0.44–1.00)
GFR, Estimated: >60 mL/min (ref >60)
Glucose, Bld: 188 mg/dL — ABNORMAL HIGH (ref 70–99)
Potassium: 3.7 mmol/L (ref 3.5–5.1)
Sodium: 140 mmol/L (ref 135–145)

## 2021-05-21 LAB — PHOSPHORUS: Phosphorus: 1.7 mg/dL — ABNORMAL LOW (ref 2.5–4.6)

## 2021-05-21 LAB — ABO/RH: ABO/RH(D): A POS

## 2021-05-21 LAB — PLATELET COUNT: Platelets: 132 K/uL — ABNORMAL LOW (ref 150–400)

## 2021-05-21 SURGERY — REPAIR, MITRAL VALVE, MINIMALLY INVASIVE
Anesthesia: General | Site: Chest

## 2021-05-21 MED ORDER — PHENYLEPHRINE HCL-NACL 20-0.9 MG/250ML-% IV SOLN
0.0000 ug/min | INTRAVENOUS | Status: DC
  Administered 2021-05-21: 45 ug/min via INTRAVENOUS
  Filled 2021-05-21: qty 250

## 2021-05-21 MED ORDER — ACETAMINOPHEN 650 MG RE SUPP
650.0000 mg | Freq: Once | RECTAL | Status: AC
Start: 2021-05-21 — End: 2021-05-21
  Administered 2021-05-21: 650 mg via RECTAL

## 2021-05-21 MED ORDER — BISACODYL 5 MG PO TBEC
10.0000 mg | DELAYED_RELEASE_TABLET | Freq: Every day | ORAL | Status: DC
  Administered 2021-05-22 – 2021-05-25 (×4): 10 mg via ORAL
  Filled 2021-05-21 (×5): qty 2

## 2021-05-21 MED ORDER — SODIUM CHLORIDE 0.9% FLUSH
3.0000 mL | Freq: Two times a day (BID) | INTRAVENOUS | Status: DC
  Administered 2021-05-22 – 2021-05-26 (×9): 3 mL via INTRAVENOUS

## 2021-05-21 MED ORDER — VANCOMYCIN HCL IN DEXTROSE 1-5 GM/200ML-% IV SOLN
1000.0000 mg | Freq: Once | INTRAVENOUS | Status: AC
Start: 2021-05-21 — End: 2021-05-21
  Administered 2021-05-21: 1000 mg via INTRAVENOUS
  Filled 2021-05-21: qty 200

## 2021-05-21 MED ORDER — ONDANSETRON HCL 4 MG/2ML IJ SOLN
4.0000 mg | Freq: Four times a day (QID) | INTRAMUSCULAR | Status: DC | PRN
  Administered 2021-05-21 – 2021-05-22 (×2): 4 mg via INTRAVENOUS
  Filled 2021-05-21 (×2): qty 2

## 2021-05-21 MED ORDER — ALBUMIN HUMAN 5 % IV SOLN
250.0000 mL | INTRAVENOUS | Status: DC | PRN
  Administered 2021-05-21 – 2021-05-22 (×3): 12.5 g via INTRAVENOUS
  Filled 2021-05-21 (×2): qty 250

## 2021-05-21 MED ORDER — LACTATED RINGERS IV SOLN: INTRAVENOUS | Status: DC | PRN

## 2021-05-21 MED ORDER — FENTANYL CITRATE (PF) 250 MCG/5ML IJ SOLN
INTRAMUSCULAR | Status: AC
  Filled 2021-05-21: qty 25

## 2021-05-21 MED ORDER — DEXTROSE 50 % IV SOLN: 0.0000 mL | INTRAVENOUS | Status: DC | PRN

## 2021-05-21 MED ORDER — DEXMEDETOMIDINE HCL IN NACL 400 MCG/100ML IV SOLN: 0.0000 ug/kg/h | INTRAVENOUS | Status: DC

## 2021-05-21 MED ORDER — ASPIRIN 81 MG PO CHEW: 324.0000 mg | CHEWABLE_TABLET | Freq: Every day | ORAL | Status: DC

## 2021-05-21 MED ORDER — OXYCODONE HCL 5 MG PO TABS
5.0000 mg | ORAL_TABLET | ORAL | Status: DC | PRN
Start: 2021-05-21 — End: 2021-05-26
  Administered 2021-05-22 (×2): 10 mg via ORAL
  Administered 2021-05-22: 5 mg via ORAL
  Administered 2021-05-23: 10 mg via ORAL
  Administered 2021-05-23 – 2021-05-24 (×2): 5 mg via ORAL
  Administered 2021-05-24 (×2): 10 mg via ORAL
  Filled 2021-05-21: qty 2
  Filled 2021-05-21: qty 1
  Filled 2021-05-21 (×3): qty 2
  Filled 2021-05-21: qty 1
  Filled 2021-05-21: qty 2
  Filled 2021-05-21: qty 1

## 2021-05-21 MED ORDER — HEPARIN SODIUM (PORCINE) 1000 UNIT/ML IJ SOLN
INTRAMUSCULAR | Status: DC | PRN
  Administered 2021-05-21: 21000 [IU] via INTRAVENOUS

## 2021-05-21 MED ORDER — PANTOPRAZOLE SODIUM 40 MG PO TBEC
40.0000 mg | DELAYED_RELEASE_TABLET | Freq: Every day | ORAL | Status: DC
  Administered 2021-05-23 – 2021-05-26 (×4): 40 mg via ORAL
  Filled 2021-05-21 (×3): qty 1

## 2021-05-21 MED ORDER — CHLORHEXIDINE GLUCONATE 0.12 % MT SOLN
15.0000 mL | OROMUCOSAL | Status: AC
  Administered 2021-05-21: 15 mL via OROMUCOSAL

## 2021-05-21 MED ORDER — ACETAMINOPHEN 160 MG/5ML PO SOLN: 1000.0000 mg | Freq: Four times a day (QID) | ORAL | Status: DC

## 2021-05-21 MED ORDER — ACETAMINOPHEN 160 MG/5ML PO SOLN: 650.0000 mg | Freq: Once | ORAL | Status: AC

## 2021-05-21 MED ORDER — SODIUM CHLORIDE 0.9% FLUSH: 3.0000 mL | INTRAVENOUS | Status: DC | PRN

## 2021-05-21 MED ORDER — PHENYLEPHRINE HCL (PRESSORS) 10 MG/ML IV SOLN: INTRAVENOUS | Status: DC | PRN

## 2021-05-21 MED ORDER — LACTATED RINGERS IV SOLN: 500.0000 mL | Freq: Once | INTRAVENOUS | Status: DC | PRN

## 2021-05-21 MED ORDER — LACTATED RINGERS IV SOLN: INTRAVENOUS | Status: DC

## 2021-05-21 MED ORDER — METOPROLOL TARTRATE 12.5 MG HALF TABLET
12.5000 mg | ORAL_TABLET | Freq: Once | ORAL | Status: DC
Start: 2021-05-21 — End: 2021-05-21
  Filled 2021-05-21: qty 1

## 2021-05-21 MED ORDER — MIDAZOLAM HCL 2 MG/2ML IJ SOLN: 2.0000 mg | INTRAMUSCULAR | Status: DC | PRN

## 2021-05-21 MED ORDER — ASPIRIN EC 325 MG PO TBEC: 325.0000 mg | DELAYED_RELEASE_TABLET | Freq: Every day | ORAL | Status: DC

## 2021-05-21 MED ORDER — CHLORHEXIDINE GLUCONATE 0.12 % MT SOLN
15.0000 mL | Freq: Once | OROMUCOSAL | Status: DC
  Filled 2021-05-21: qty 15

## 2021-05-21 MED ORDER — ORAL CARE MOUTH RINSE
15.0000 mL | Freq: Two times a day (BID) | OROMUCOSAL | Status: DC
  Administered 2021-05-22: 15 mL via OROMUCOSAL

## 2021-05-21 MED ORDER — SODIUM CHLORIDE 0.9 % IV SOLN
250.0000 mL | INTRAVENOUS | Status: DC
  Administered 2021-05-22: 250 mL via INTRAVENOUS

## 2021-05-21 MED ORDER — SUGAMMADEX SODIUM 200 MG/2ML IV SOLN
INTRAVENOUS | Status: DC | PRN
  Administered 2021-05-21: 200 mg via INTRAVENOUS

## 2021-05-21 MED ORDER — MORPHINE SULFATE (PF) 2 MG/ML IV SOLN
1.0000 mg | INTRAVENOUS | Status: DC | PRN
  Administered 2021-05-21 – 2021-05-22 (×4): 2 mg via INTRAVENOUS
  Filled 2021-05-21 (×4): qty 1

## 2021-05-21 MED ORDER — PROPOFOL 10 MG/ML IV BOLUS
INTRAVENOUS | Status: DC | PRN
  Administered 2021-05-21: 120 mg via INTRAVENOUS

## 2021-05-21 MED ORDER — FAMOTIDINE IN NACL 20-0.9 MG/50ML-% IV SOLN
20.0000 mg | Freq: Two times a day (BID) | INTRAVENOUS | Status: DC
  Administered 2021-05-21: 20 mg via INTRAVENOUS
  Filled 2021-05-21 (×2): qty 50

## 2021-05-21 MED ORDER — PHENYLEPHRINE 40 MCG/ML (10ML) SYRINGE FOR IV PUSH (FOR BLOOD PRESSURE SUPPORT)
PREFILLED_SYRINGE | INTRAVENOUS | Status: DC | PRN
  Administered 2021-05-21: 40 ug via INTRAVENOUS
  Administered 2021-05-21 (×2): 80 ug via INTRAVENOUS
  Administered 2021-05-21: 120 ug via INTRAVENOUS

## 2021-05-21 MED ORDER — ALBUMIN HUMAN 5 % IV SOLN: INTRAVENOUS | Status: DC | PRN

## 2021-05-21 MED ORDER — CHLORHEXIDINE GLUCONATE 4 % EX LIQD: 30.0000 mL | CUTANEOUS | Status: DC

## 2021-05-21 MED ORDER — MIDAZOLAM HCL (PF) 10 MG/2ML IJ SOLN
INTRAMUSCULAR | Status: AC
  Filled 2021-05-21: qty 2

## 2021-05-21 MED ORDER — ONDANSETRON HCL 4 MG/2ML IJ SOLN
INTRAMUSCULAR | Status: DC | PRN
  Administered 2021-05-21: 4 mg via INTRAVENOUS

## 2021-05-21 MED ORDER — BUPIVACAINE LIPOSOME 1.3 % IJ SUSP
INTRAMUSCULAR | Status: AC
  Filled 2021-05-21: qty 20

## 2021-05-21 MED ORDER — DEXAMETHASONE SODIUM PHOSPHATE 10 MG/ML IJ SOLN
INTRAMUSCULAR | Status: DC | PRN
  Administered 2021-05-21: 10 mg via INTRAVENOUS

## 2021-05-21 MED ORDER — METOPROLOL TARTRATE 5 MG/5ML IV SOLN: 2.5000 mg | INTRAVENOUS | Status: DC | PRN

## 2021-05-21 MED ORDER — ~~LOC~~ CARDIAC SURGERY, PATIENT & FAMILY EDUCATION
Freq: Once | Status: DC
Start: 2021-05-21 — End: 2021-05-21
  Filled 2021-05-21: qty 1

## 2021-05-21 MED ORDER — INSULIN REGULAR(HUMAN) IN NACL 100-0.9 UT/100ML-% IV SOLN
INTRAVENOUS | Status: DC
  Filled 2021-05-21: qty 100

## 2021-05-21 MED ORDER — BUPIVACAINE HCL (PF) 0.5 % IJ SOLN
INTRAMUSCULAR | Status: AC
  Filled 2021-05-21: qty 30

## 2021-05-21 MED ORDER — PROPOFOL 10 MG/ML IV BOLUS
INTRAVENOUS | Status: AC
  Filled 2021-05-21: qty 20

## 2021-05-21 MED ORDER — MILRINONE LACTATE IN DEXTROSE 20-5 MG/100ML-% IV SOLN
0.2500 ug/kg/min | INTRAVENOUS | Status: DC
  Administered 2021-05-22: 0.25 ug/kg/min via INTRAVENOUS
  Filled 2021-05-21 (×2): qty 100

## 2021-05-21 MED ORDER — SODIUM CHLORIDE 0.9 % IV SOLN: INTRAVENOUS | Status: DC

## 2021-05-21 MED ORDER — TRAMADOL HCL 50 MG PO TABS
50.0000 mg | ORAL_TABLET | ORAL | Status: DC | PRN
  Administered 2021-05-21: 100 mg via ORAL
  Administered 2021-05-22: 50 mg via ORAL
  Administered 2021-05-22 (×2): 100 mg via ORAL
  Administered 2021-05-23: 50 mg via ORAL
  Filled 2021-05-21 (×2): qty 2
  Filled 2021-05-21: qty 1
  Filled 2021-05-21: qty 2
  Filled 2021-05-21: qty 1

## 2021-05-21 MED ORDER — CHLORHEXIDINE GLUCONATE CLOTH 2 % EX PADS
6.0000 | MEDICATED_PAD | Freq: Every day | CUTANEOUS | Status: DC
  Administered 2021-05-22 – 2021-05-23 (×2): 6 via TOPICAL

## 2021-05-21 MED ORDER — 0.9 % SODIUM CHLORIDE (POUR BTL) OPTIME
TOPICAL | Status: DC | PRN
  Administered 2021-05-21: 5000 mL

## 2021-05-21 MED ORDER — BISACODYL 10 MG RE SUPP: 10.0000 mg | Freq: Every day | RECTAL | Status: DC

## 2021-05-21 MED ORDER — CEFAZOLIN SODIUM-DEXTROSE 2-4 GM/100ML-% IV SOLN
2.0000 g | Freq: Three times a day (TID) | INTRAVENOUS | Status: AC
  Administered 2021-05-21 – 2021-05-23 (×6): 2 g via INTRAVENOUS
  Filled 2021-05-21 (×6): qty 100

## 2021-05-21 MED ORDER — SODIUM CHLORIDE 0.45 % IV SOLN: INTRAVENOUS | Status: DC | PRN

## 2021-05-21 MED ORDER — ACETAMINOPHEN 500 MG PO TABS
1000.0000 mg | ORAL_TABLET | Freq: Four times a day (QID) | ORAL | Status: AC
  Administered 2021-05-22 – 2021-05-26 (×13): 1000 mg via ORAL
  Filled 2021-05-21 (×14): qty 2

## 2021-05-21 MED ORDER — DOCUSATE SODIUM 100 MG PO CAPS
200.0000 mg | ORAL_CAPSULE | Freq: Every day | ORAL | Status: DC
  Administered 2021-05-22 – 2021-05-25 (×4): 200 mg via ORAL
  Filled 2021-05-21 (×5): qty 2

## 2021-05-21 MED ORDER — PROTAMINE SULFATE 10 MG/ML IV SOLN
INTRAVENOUS | Status: DC | PRN
  Administered 2021-05-21: 200 mg via INTRAVENOUS
  Administered 2021-05-21: 10 mg via INTRAVENOUS

## 2021-05-21 MED ORDER — ROCURONIUM BROMIDE 10 MG/ML (PF) SYRINGE
PREFILLED_SYRINGE | INTRAVENOUS | Status: DC | PRN
  Administered 2021-05-21: 40 mg via INTRAVENOUS
  Administered 2021-05-21: 60 mg via INTRAVENOUS
  Administered 2021-05-21: 50 mg via INTRAVENOUS

## 2021-05-21 MED ORDER — NITROGLYCERIN IN D5W 200-5 MCG/ML-% IV SOLN: 0.0000 ug/min | INTRAVENOUS | Status: DC

## 2021-05-21 MED ORDER — BUPIVACAINE LIPOSOME 1.3 % IJ SUSP
INTRAMUSCULAR | Status: DC | PRN
  Administered 2021-05-21: 50 mL

## 2021-05-21 MED ORDER — EPHEDRINE SULFATE-NACL 50-0.9 MG/10ML-% IV SOSY
PREFILLED_SYRINGE | INTRAVENOUS | Status: DC | PRN
  Administered 2021-05-21: 15 mg via INTRAVENOUS
  Administered 2021-05-21: 10 mg via INTRAVENOUS
  Administered 2021-05-21: 15 mg via INTRAVENOUS

## 2021-05-21 MED ORDER — VANCOMYCIN HCL 1000 MG IV SOLR: INTRAVENOUS | Status: DC | PRN

## 2021-05-21 MED ORDER — CHLORHEXIDINE GLUCONATE 0.12 % MT SOLN
15.0000 mL | Freq: Two times a day (BID) | OROMUCOSAL | Status: DC
  Administered 2021-05-21 – 2021-05-22 (×2): 15 mL via OROMUCOSAL
  Filled 2021-05-21: qty 15

## 2021-05-21 MED ORDER — POTASSIUM CHLORIDE 10 MEQ/50ML IV SOLN
10.0000 meq | INTRAVENOUS | Status: AC
  Administered 2021-05-21 (×3): 10 meq via INTRAVENOUS

## 2021-05-21 MED ORDER — LIDOCAINE 2% (20 MG/ML) 5 ML SYRINGE
INTRAMUSCULAR | Status: DC | PRN
  Administered 2021-05-21: 60 mg via INTRAVENOUS

## 2021-05-21 MED ORDER — MAGNESIUM SULFATE 4 GM/100ML IV SOLN
4.0000 g | Freq: Once | INTRAVENOUS | Status: AC
  Administered 2021-05-21: 4 g via INTRAVENOUS
  Filled 2021-05-21: qty 100

## 2021-05-21 MED ORDER — POTASSIUM CHLORIDE 10 MEQ/50ML IV SOLN
10.0000 meq | INTRAVENOUS | Status: AC
  Administered 2021-05-21 – 2021-05-22 (×3): 10 meq via INTRAVENOUS
  Filled 2021-05-21 (×3): qty 50

## 2021-05-21 MED ORDER — SODIUM CHLORIDE 0.9 % IV SOLN
12.5000 mg | INTRAVENOUS | Status: DC | PRN
  Administered 2021-05-21 – 2021-05-22 (×2): 12.5 mg via INTRAVENOUS
  Filled 2021-05-21 (×4): qty 0.5

## 2021-05-21 MED ORDER — PLASMA-LYTE A IV SOLN
INTRAVENOUS | Status: DC | PRN
  Administered 2021-05-21: 500 mL

## 2021-05-21 MED ORDER — MIDAZOLAM HCL 5 MG/5ML IJ SOLN
INTRAMUSCULAR | Status: DC | PRN
  Administered 2021-05-21: 2 mg via INTRAVENOUS
  Administered 2021-05-21: 1 mg via INTRAVENOUS

## 2021-05-21 MED ORDER — VANCOMYCIN HCL 1000 MG IV SOLR
INTRAVENOUS | Status: DC | PRN
  Administered 2021-05-21: 1000 mL

## 2021-05-21 MED ORDER — FENTANYL CITRATE (PF) 250 MCG/5ML IJ SOLN
INTRAMUSCULAR | Status: DC | PRN
  Administered 2021-05-21: 50 ug via INTRAVENOUS
  Administered 2021-05-21: 150 ug via INTRAVENOUS
  Administered 2021-05-21 (×8): 50 ug via INTRAVENOUS

## 2021-05-21 SURGICAL SUPPLY — 128 items
ADAPTER CARDIO PERF ANTE/RETRO (ADAPTER) ×4 IMPLANT
ARTICLIP LAA PROCLIP II 40 (Clip) ×4 IMPLANT
BAG DECANTER FOR FLEXI CONT (MISCELLANEOUS) ×8 IMPLANT
BENZOIN TINCTURE PRP APPL 2/3 (GAUZE/BANDAGES/DRESSINGS) ×4 IMPLANT
BLADE CLIPPER SURG (BLADE) ×4 IMPLANT
BLADE STERNUM SYSTEM 6 (BLADE) IMPLANT
BLADE SURG 11 STRL SS (BLADE) ×4 IMPLANT
CANISTER SUCT 3000ML PPV (MISCELLANEOUS) ×4 IMPLANT
CANNULA ADULT BIO-MEDICUS 15FR (CANNULA) ×4 IMPLANT
CANNULA FEM VENOUS REMOTE 22FR (CANNULA) ×4 IMPLANT
CANNULA FEMORAL ART 14 SM (MISCELLANEOUS) ×4 IMPLANT
CANNULA GUNDRY RCSP 15FR (MISCELLANEOUS) ×4 IMPLANT
CANNULA OPTISITE PERFUSION 16F (CANNULA) ×4 IMPLANT
CANNULA SUMP PERICARDIAL (CANNULA) ×8 IMPLANT
CATH CPB KIT OWEN (MISCELLANEOUS) IMPLANT
CELLS DAT CNTRL 66122 CELL SVR (MISCELLANEOUS) ×3 IMPLANT
CNTNR URN SCR LID CUP LEK RST (MISCELLANEOUS) ×3 IMPLANT
CONN ST 1/4X3/8  BEN (MISCELLANEOUS) ×2
CONN ST 1/4X3/8 BEN (MISCELLANEOUS) ×6 IMPLANT
CONNECTOR 1/2X3/8X1/2 3 WAY (MISCELLANEOUS) ×1
CONNECTOR 1/2X3/8X1/2 3WAY (MISCELLANEOUS) ×3 IMPLANT
CONT SPEC 4OZ STRL OR WHT (MISCELLANEOUS) ×1
CONTAINER PROTECT SURGISLUSH (MISCELLANEOUS) ×8 IMPLANT
COVER BACK TABLE 24X17X13 BIG (DRAPES) ×4 IMPLANT
COVER MAYO STAND STRL (DRAPES) ×4 IMPLANT
COVER PROBE W GEL 5X96 (DRAPES) ×4 IMPLANT
COVER SURGICAL LIGHT HANDLE (MISCELLANEOUS) ×4 IMPLANT
DERMABOND ADVANCED (GAUZE/BANDAGES/DRESSINGS) ×1
DERMABOND ADVANCED .7 DNX12 (GAUZE/BANDAGES/DRESSINGS) ×3 IMPLANT
DEVICE ATRICLIP LAA PRCLPII 40 (Clip) ×3 IMPLANT
DEVICE CLOSURE PERCLS PRGLD 6F (VASCULAR PRODUCTS) ×15 IMPLANT
DEVICE SUT CK QUICK LOAD INDV (Prosthesis & Implant Heart) ×8 IMPLANT
DEVICE SUT CK QUICK LOAD MINI (Prosthesis & Implant Heart) ×4 IMPLANT
DEVICE TROCAR PUNCTURE CLOSURE (ENDOMECHANICALS) ×8 IMPLANT
DRAIN CHANNEL 32F RND 10.7 FF (WOUND CARE) ×16 IMPLANT
DRAPE C-ARM 42X72 X-RAY (DRAPES) ×4 IMPLANT
DRAPE CV SPLIT W-CLR ANES SCRN (DRAPES) ×4 IMPLANT
DRAPE INCISE IOBAN 66X45 STRL (DRAPES) ×8 IMPLANT
DRAPE PERI GROIN 82X75IN TIB (DRAPES) ×4 IMPLANT
DRAPE WARM FLUID 44X44 (DRAPES) ×4 IMPLANT
DRSG AQUACEL AG ADV 3.5X 6 (GAUZE/BANDAGES/DRESSINGS) ×4 IMPLANT
DRSG AQUACEL AG ADV 3.5X10 (GAUZE/BANDAGES/DRESSINGS) ×4 IMPLANT
ELECT BLADE 6.5 EXT (BLADE) ×4 IMPLANT
ELECT REM PT RETURN 9FT ADLT (ELECTROSURGICAL) ×8
ELECTRODE REM PT RTRN 9FT ADLT (ELECTROSURGICAL) ×6 IMPLANT
FELT TEFLON 1X6 (MISCELLANEOUS) ×8 IMPLANT
FEMORAL VENOUS CANN RAP (CANNULA) IMPLANT
GAUZE SPONGE 4X4 12PLY STRL (GAUZE/BANDAGES/DRESSINGS) ×4 IMPLANT
GAUZE SPONGE 4X4 12PLY STRL LF (GAUZE/BANDAGES/DRESSINGS) ×4 IMPLANT
GLOVE SURG MICRO LTX SZ6.5 (GLOVE) ×8 IMPLANT
GLOVE SURG ORTHO LTX SZ7.5 (GLOVE) ×8 IMPLANT
GOWN STRL REUS W/ TWL LRG LVL3 (GOWN DISPOSABLE) ×21 IMPLANT
GOWN STRL REUS W/TWL LRG LVL3 (GOWN DISPOSABLE) ×7
GRASPER SUT TROCAR 14GX15 (MISCELLANEOUS) ×4 IMPLANT
KIT BASIN OR (CUSTOM PROCEDURE TRAY) ×4 IMPLANT
KIT DILATOR VASC 18G NDL (KITS) ×8 IMPLANT
KIT DRAINAGE VACCUM ASSIST (KITS) ×4 IMPLANT
KIT MICROPUNCTURE NIT STIFF (SHEATH) ×4 IMPLANT
KIT SUCTION CATH 14FR (SUCTIONS) ×8 IMPLANT
KIT SUT CK MINI COMBO 4X17 (Prosthesis & Implant Heart) ×4 IMPLANT
KIT TURNOVER KIT B (KITS) ×4 IMPLANT
LEAD PACING MYOCARDI (MISCELLANEOUS) ×4 IMPLANT
LINE VENT (MISCELLANEOUS) ×4 IMPLANT
NEEDLE AORTIC ROOT 14G 7F (CATHETERS) ×4 IMPLANT
NS IRRIG 1000ML POUR BTL (IV SOLUTION) ×20 IMPLANT
PACK OPEN HEART (CUSTOM PROCEDURE TRAY) ×4 IMPLANT
PAD ARMBOARD 7.5X6 YLW CONV (MISCELLANEOUS) ×8 IMPLANT
PAD ELECT DEFIB RADIOL ZOLL (MISCELLANEOUS) ×4 IMPLANT
PERCLOSE PROGLIDE 6F (VASCULAR PRODUCTS) ×20
POSITIONER HEAD DONUT 9IN (MISCELLANEOUS) ×4 IMPLANT
RING ANLPLS SIMUFORM 30 (Prosthesis & Implant Heart) ×3 IMPLANT
RING ANNULOPLASTY SIMUFORM 30 (Prosthesis & Implant Heart) ×1 IMPLANT
RTRCTR WOUND ALEXIS 18CM MED (MISCELLANEOUS) ×4
SET CANNULATION TOURNIQUET (MISCELLANEOUS) ×4 IMPLANT
SET IRRIG TUBING LAPAROSCOPIC (IRRIGATION / IRRIGATOR) ×4 IMPLANT
SET MICROPUNCTURE 5F STIFF (MISCELLANEOUS) ×4 IMPLANT
SET MPS 3-ND DEL (MISCELLANEOUS) ×4 IMPLANT
SHEATH PINNACLE 8F 10CM (SHEATH) ×12 IMPLANT
SIZER CHORD-X CHORDAL CXCS (SIZER) ×4 IMPLANT
SOL ANTI FOG 6CC (MISCELLANEOUS) ×3 IMPLANT
SOLUTION ANTI FOG 6CC (MISCELLANEOUS) ×1
SUT BONE WAX W31G (SUTURE) ×4 IMPLANT
SUT EB EXC GRN/WHT 2-0 D/A SH (SUTURE) ×4
SUT ETHIBOND NAB MH 2-0 36IN (SUTURE) ×4 IMPLANT
SUT ETHIBOND X763 2 0 SH 1 (SUTURE) ×4 IMPLANT
SUT GORETEX CV4 TH-18 (SUTURE) IMPLANT
SUT MNCRL AB 3-0 PS2 18 (SUTURE) ×8 IMPLANT
SUT MNCRL AB 4-0 PS2 18 (SUTURE) ×4 IMPLANT
SUT PROLENE 3 0 SH DA (SUTURE) ×16 IMPLANT
SUT PROLENE 3 0 SH1 36 (SUTURE) ×16 IMPLANT
SUT PROLENE 5 0 C 1 36 (SUTURE) ×8 IMPLANT
SUT PROLENE 6 0 C 1 30 (SUTURE) ×4 IMPLANT
SUT PTFE CHORD X 16MM (SUTURE) ×4 IMPLANT
SUT SILK  1 MH (SUTURE) ×9
SUT SILK 1 MH (SUTURE) ×27 IMPLANT
SUT SILK 1 TIES 10X30 (SUTURE) ×4 IMPLANT
SUT SILK 2 0 SH CR/8 (SUTURE) ×4 IMPLANT
SUT SILK 2 0 TIES 10X30 (SUTURE) ×4 IMPLANT
SUT SILK 2 0SH CR/8 30 (SUTURE) ×8 IMPLANT
SUT SILK 3 0 (SUTURE) ×1
SUT SILK 3 0 SH CR/8 (SUTURE) ×4 IMPLANT
SUT SILK 3 0SH CR/8 30 (SUTURE) ×4 IMPLANT
SUT SILK 3-0 18XBRD TIE 12 (SUTURE) ×3 IMPLANT
SUT TEM PAC WIRE 2 0 SH (SUTURE) ×8 IMPLANT
SUT VIC AB 1 CTX 36 (SUTURE) ×2
SUT VIC AB 1 CTX36XBRD ANBCTR (SUTURE) ×6 IMPLANT
SUT VIC AB 2 TP1 27 (SUTURE) ×8 IMPLANT
SUT VIC AB 2-0 CT1 27 (SUTURE) ×2
SUT VIC AB 2-0 CT1 TAPERPNT 27 (SUTURE) ×6 IMPLANT
SUT VIC AB 2-0 CTX 27 (SUTURE) ×8 IMPLANT
SUT VIC AB 2-0 UR6 27 (SUTURE) ×8 IMPLANT
SUT VIC AB 3-0 SH 8-18 (SUTURE) ×12 IMPLANT
SUT VICRYL 2 TP 1 (SUTURE) ×4 IMPLANT
SUTURE EB EXC GRN/WHT 2-0 D/A (SUTURE) ×3 IMPLANT
SYSTEM SAHARA CHEST DRAIN ATS (WOUND CARE) ×4 IMPLANT
TAPE CLOTH SURG 4X10 WHT LF (GAUZE/BANDAGES/DRESSINGS) ×4 IMPLANT
TAPE PAPER 2X10 WHT MICROPORE (GAUZE/BANDAGES/DRESSINGS) ×4 IMPLANT
TOWEL GREEN STERILE (TOWEL DISPOSABLE) ×8 IMPLANT
TOWEL GREEN STERILE FF (TOWEL DISPOSABLE) ×8 IMPLANT
TRAY FOLEY SLVR 14FR TEMP STAT (SET/KITS/TRAYS/PACK) ×4 IMPLANT
TROCAR XCEL BLADELESS 5X75MML (TROCAR) ×4 IMPLANT
TROCAR XCEL NON-BLD 11X100MML (ENDOMECHANICALS) ×8 IMPLANT
TUBE SUCT INTRACARD DLP 20F (MISCELLANEOUS) ×4 IMPLANT
TUBING ART PRESS 72  MALE/FEM (TUBING) ×1
TUBING ART PRESS 72 MALE/FEM (TUBING) ×3 IMPLANT
UNDERPAD 30X36 HEAVY ABSORB (UNDERPADS AND DIAPERS) ×4 IMPLANT
WATER STERILE IRR 1000ML POUR (IV SOLUTION) ×8 IMPLANT
WIRE EMERALD 3MM-J .035X150CM (WIRE) ×4 IMPLANT

## 2021-05-21 NOTE — Anesthesia Procedure Notes (Signed)
Procedure Name: Intubation Date/Time: 05/21/2021 8:06 AM Performed by: Clearnce Sorrel, CRNA Pre-anesthesia Checklist: Patient identified, Emergency Drugs available, Suction available, Patient being monitored and Timeout performed Patient Re-evaluated:Patient Re-evaluated prior to induction Oxygen Delivery Method: Circle system utilized Preoxygenation: Pre-oxygenation with 100% oxygen Induction Type: IV induction Ventilation: Mask ventilation without difficulty and Oral airway inserted - appropriate to patient size Laryngoscope Size: Mac and 3 Grade View: Grade I Tube type: Oral Endobronchial tube: Left and Double lumen EBT and 35 Fr Number of attempts: 1 Airway Equipment and Method: Stylet Placement Confirmation: ETT inserted through vocal cords under direct vision, positive ETCO2 and breath sounds checked- equal and bilateral Tube secured with: Tape Dental Injury: Teeth and Oropharynx as per pre-operative assessment

## 2021-05-21 NOTE — Brief Op Note (Signed)
05/21/2021  11:57 AM  PATIENT:  [name] [name]  75 y.o. female  PRE-OPERATIVE DIAGNOSIS:  Mitral Regurgitation  POST-OPERATIVE DIAGNOSIS:  Mitral Regurgitation  PROCEDURE:   MINIMALLY INVASIVE MITRAL VALVE REPAIR (MVR) USING MEDTRONIC SIMUFORM 30MM RING  CLIPPING OF ATRIAL APPENDAGE USING ATRICURE 40MM PRO2 CLIP (N/A) TRANSESOPHAGEAL ECHOCARDIOGRAM (TEE) (N/A)  SURGEON:   Rexene Alberts, MD - Primary  PHYSICIAN ASSISTANT: Devonia Farro  ASSISTANTS: Ara Kussmaul, RNFA   ANESTHESIA:   general  EBL:  per perfusion and anesthesia records   BLOOD ADMINISTERED:none  DRAINS:  Right pleural and mediastinal drains    LOCAL MEDICATIONS USED:  NONE  SPECIMEN:  No Specimen  DISPOSITION OF SPECIMEN:  N/A  COUNTS:  YES  DICTATION: .Dragon Dictation  PLAN OF CARE: Admit to inpatient   PATIENT DISPOSITION:  ICU - extubated and stable.   Delay start of Pharmacological VTE agent (>24hrs) due to surgical blood loss or risk of bleeding: yes

## 2021-05-21 NOTE — Interval H&P Note (Signed)
History and Physical Interval Note:  05/21/2021 5:40 AM  [name] [name]  has presented today for surgery, with the diagnosis of MR.  The various methods of treatment have been discussed with the patient and family. After consideration of risks, benefits and other options for treatment, the patient has consented to  Procedure(s): MINIMALLY INVASIVE MITRAL VALVE REPAIR (MVR) (Right) possible,CLIPPING OF ATRIAL APPENDAGE (N/A) TRANSESOPHAGEAL ECHOCARDIOGRAM (TEE) (N/A) as a surgical intervention.  The patient's history has been reviewed, patient examined, no change in status, stable for surgery.  I have reviewed the patient's chart and labs.  Questions were answered to the patient's satisfaction.     Rexene Alberts

## 2021-05-21 NOTE — Anesthesia Procedure Notes (Signed)
Central Venous Catheter Insertion Performed by: Darral Dash, DO, anesthesiologist Start/End6/28/2022 7:05 AM, 05/21/2021 7:20 AM Patient location: Pre-op. Preanesthetic checklist: patient identified, IV checked, site marked, risks and benefits discussed, surgical consent, monitors and equipment checked, pre-op evaluation, timeout performed and anesthesia consent Position: Trendelenburg Lidocaine 1% used for infiltration and patient sedated Hand hygiene performed  and maximum sterile barriers used  Catheter size: 8.5 Fr Central line was placed.Sheath introducer Procedure performed using ultrasound guided technique. Ultrasound Notes:anatomy identified, needle tip was noted to be adjacent to the nerve/plexus identified, no ultrasound evidence of intravascular and/or intraneural injection and image(s) printed for medical record Attempts: 1 Following insertion, line sutured, dressing applied and Biopatch. Post procedure assessment: blood return through all ports, free fluid flow and no air  Patient tolerated the procedure well with no immediate complications.

## 2021-05-21 NOTE — Transfer of Care (Signed)
Immediate Anesthesia Transfer of Care Note  Patient: [name] [name]  Procedure(s) Performed: MINIMALLY INVASIVE MITRAL VALVE REPAIR (MVR) USING MEDTRONIC SIMUFORM 30MM RING (Chest) CLIPPING OF ATRIAL APPENDAGE USING ATRICURE 40MM PRO2 CLIP (Chest) TRANSESOPHAGEAL ECHOCARDIOGRAM (TEE)  Patient Location: PACU  Anesthesia Type:General  Level of Consciousness: drowsy  Airway & Oxygen Therapy: Patient Spontanous Breathing and Patient connected to face mask oxygen  Post-op Assessment: Report given to RN and Post -op Vital signs reviewed and stable  Post vital signs: Reviewed and stable  Last Vitals:  Vitals Value Taken Time  BP 99/66 05/21/21 1400  Temp 35.1 C 05/21/21 1402  Pulse 80 05/21/21 1402  Resp 18 05/21/21 1402  SpO2 100 % 05/21/21 1402  Vitals shown include unvalidated device data.  Last Pain:  Vitals:   05/21/21 0624  TempSrc:   PainSc: 0-No pain      Patients Stated Pain Goal: 0 (24/93/24 1991)  Complications: No notable events documented.

## 2021-05-21 NOTE — Anesthesia Procedure Notes (Signed)
Arterial Line Insertion Start/End6/28/2022 7:00 AM, 05/21/2021 7:04 AM Performed by: Janace Litten, CRNA  Patient location: Pre-op. Preanesthetic checklist: patient identified, IV checked, site marked, risks and benefits discussed, surgical consent, monitors and equipment checked, pre-op evaluation, timeout performed and anesthesia consent Lidocaine 1% used for infiltration Left, radial was placed Catheter size: 20 Fr Hand hygiene performed  and maximum sterile barriers used   Attempts: 1 Procedure performed without using ultrasound guided technique. Following insertion, dressing applied. Post procedure assessment: normal and unchanged

## 2021-05-21 NOTE — Plan of Care (Signed)
  Problem: Education: Goal: Knowledge of General Education information will improve Description: Including pain rating scale, medication(s)/side effects and non-pharmacologic comfort measures Outcome: Progressing   Problem: Health Behavior/Discharge Planning: Goal: Ability to manage health-related needs will improve Outcome: Progressing   Problem: Clinical Measurements: Goal: Ability to maintain clinical measurements within normal limits will improve Outcome: Progressing Goal: Will remain free from infection Outcome: Progressing Goal: Diagnostic test results will improve Outcome: Progressing Goal: Respiratory complications will improve Outcome: Progressing Goal: Cardiovascular complication will be avoided Outcome: Progressing   Problem: Pain Managment: Goal: General experience of comfort will improve Outcome: Progressing   Problem: Safety: Goal: Ability to remain free from injury will improve Outcome: Progressing   Problem: Skin Integrity: Goal: Risk for impaired skin integrity will decrease Outcome: Progressing   Problem: Education: Goal: Will demonstrate proper wound care and an understanding of methods to prevent future damage Outcome: Progressing Goal: Knowledge of disease or condition will improve Outcome: Progressing Goal: Knowledge of the prescribed therapeutic regimen will improve Outcome: Progressing Goal: Individualized Educational Video(s) Outcome: Progressing

## 2021-05-21 NOTE — Op Note (Signed)
CARDIOTHORACIC SURGERY OPERATIVE NOTE  Date of Procedure:  05/21/2021  Preoperative Diagnosis: Severe Mitral Regurgitation  Postoperative Diagnosis: Same  Procedure:   Minimally-Invasive Mitral Valve Repair  Complex valvuloplasty including PTFE neochord placement x6  Medtronic Simuform Ring Annuloplasty (size 69mm, model # [id number], serial # [id number])  Clipping of left atrial appendage (Atricure left atrial clip Pro240, size 40 mm)    Surgeon: Valentina Gu. Roxy Manns, MD  Assistant: Enid Cutter, PA-C  Anesthesia: Donney Dice, DO  Operative Findings: Fibroelastic deficiency type myxomatous degenerative disease Ruptured primary chordae tendinae with flail segment (P2) of posterior leaflet Type II dysfunction with severe mitral regurgitation Normal left ventricular systolic function No residual mitral regurgitation after successful valve repair              BRIEF CLINICAL NOTE AND INDICATIONS FOR SURGERY  Patient is 75 year old female with history of hypertension, hyperlipidemia, degenerative arthritis, PVCs, migraine headaches, irritable bowel syndrome, GE reflux disease, and hiatal hernia who has been referred for surgical consultation to discuss treatment options for recently discovered mitral valve prolapse with severe mitral regurgitation.   Patient denies any known history of heart murmur until recently.  She has been evaluated in the past for symptoms of atypical chest pain and palpitations.  Over the past 2 years she has developed progressive symptoms of exertional shortness of breath and fatigue.  She was recently noted to have a systolic murmur on physical exam by her son who is a primary care physician.  She was referred to Dr. Gwenlyn Found for cardiology consultation and transthoracic echocardiogram performed February 27, 2021 revealed mitral valve prolapse with at least moderate and possibly severe mitral regurgitation.  Left ventricular systolic function was reported to  be normal with ejection fraction estimated 55 to 60%.  There was moderate left atrial enlargement.  The patient subsequently underwent transesophageal echocardiogram March 14, 2021 which confirmed the presence of mitral valve prolapse and severe mitral regurgitation.  There was systolic flow reversal in the pulmonary veins.  There was moderate left atrial enlargement and normal left ventricular function with ejection fraction estimated 60 to 65%.  Diagnostic cardiac catheterization was performed by Dr. Gwenlyn Found and revealed normal coronary artery anatomy with no significant coronary artery disease.  Pulmonary artery pressures were mild to moderately elevated.  Cardiothoracic surgical consultation was requested.  The patient has been seen in consultation and counseled at length regarding the indications, risks and potential benefits of surgery.  All questions have been answered, and the patient provides full informed consent for the operation as described.    DETAILS OF THE OPERATIVE PROCEDURE  Preparation:  The patient is brought to the operating room on the above mentioned date and central monitoring was established by the anesthesia team including placement of Swan-Ganz catheter through the left internal jugular vein.  A radial arterial line is placed. The patient is placed in the supine position on the operating table.  Intravenous antibiotics are administered. General endotracheal anesthesia is induced uneventfully. The patient is initially intubated using a dual lumen endotracheal tube.  A Foley catheter is placed.  Baseline transesophageal echocardiogram was performed.  Findings were notable for myxomatous degenerative disease with mitral valve prolapse involving the middle scallop (P2) of the posterior leaflet.  There was severe mitral regurgitation with an eccentric jet that was directed around the anterior aspect of the left atrium.  There was flow reversal in the pulmonary veins.  There was left  atrial enlargement.  Right ventricular size and function appeared normal.  Aortic  valve appeared normal.  A soft roll is placed behind the patient's left scapula and the neck gently extended and turned to the left.   The patient's right neck, chest, abdomen, both groins, and both lower extremities are prepared and draped in a sterile manner. A time out procedure is performed.   Percutaneous Vascular Access:  Percutaneous arterial and venous access were obtained on the right side.  Using ultrasound guidance the right common femoral vein was cannulated using the Seldinger technique a pair of Perclose vascular closure devises were placed at opposing 30 degree angles in the femoral vein, after which time an 8 French sheath inserted.  The right common femoral artery was cannulated using a micropuncture wire and sheath.  A pair of Perclose vascular closure devices were placed at opposing 30 degree angles in the femoral artery, and a 8 French sheath inserted.  The right internal jugular vein was cannulated  using ultrasound guidance and an 8 French sheath inserted.     Surgical Approach:  A right miniature anterolateral thoracotomy incision is performed. The incision is placed just lateral to and superior to the right nipple. The pectoralis major muscle is retracted medially and completely preserved. The right pleural space is entered through the 3rd intercostal space. A soft tissue retractor is placed.  Two 11 mm ports are placed through separate stab incisions inferiorly. The right pleural space is insufflated continuously with carbon dioxide gas through the posterior port during the remainder of the operation.  A pledgeted sutures placed through the dome of the right hemidiaphragm and retracted inferiorly to facilitate exposure.  A longitudinal incision is made in the pericardium 3 cm anterior to the phrenic nerve and silk traction sutures are placed on either side of the incision for  exposure.   Extracorporeal Cardiopulmonary Bypass and Myocardial Protection:   The patient was heparinized systemically.  The left common femoral vein is cannulated through the venous sheath and a guidewire advanced into the right atrium using TEE guidance.  The femoral vein cannulated using a 22 Fr long femoral venous cannula.  The right common femoral artery is cannulated through the arterial sheath and a guidewire advanced into the descending thoracic aorta using TEE guidance.  Femoral artery is cannulated with a 16 French femoral arterial cannula.  The right internal jugular vein is cannulated through the venous sheath and a guidewire advanced into the right atrium.  The internal jugular vein is cannulated using a 15 [place] pediatric femoral venous cannula.   Adequate heparinization is verified.   The entire pre-bypass portion of the operation was notable for stable hemodynamics.  Cardiopulmonary bypass was begun.  Vacuum assist venous drainage is utilized. The incision in the pericardium is extended in both directions. Venous drainage and exposure are notably excellent.  The left atrial appendage is obliterated using an AtriCure Pro 240 left atrial clip placed under direct vision through the transverse sinus posterior to the aorta.  Appropriate obliteration of the appendage was verified using transesophageal echocardiogram.  An antegrade cardioplegia cannula is placed in the ascending aorta.    The patient is cooled to 32C systemic temperature.  The aortic cross clamp is applied and cardioplegia is delivered initially in an antegrade fashion through the aortic root using modified [place] cold blood cardioplegia ([place] blood cardioplegia protocol).   The initial cardioplegic arrest is rapid with early diastolic arrest.  Myocardial protection was felt to be excellent.   Mitral Valve Repair:  A left atriotomy incision was performed through the  interatrial groove and extended partially across  the back wall of the left atrium after opening the oblique sinus inferiorly.  The mitral valve is exposed using a self-retaining retractor.  The mitral valve was inspected and notable for fibroelastic deficiency type myxomatous degenerative disease.  There were ruptured primary chordae tendinae from the P2 segment of the posterior leaflet.  The site of rupture is visually noted from the anterolateral papillary muscle.  There is severe prolapse involving this flail P2 segment.  There is moderate calcification involving the posterior annulus in the P1 and P2 regions.  There is no other leaflet pathology.  Artificial neochord placement was performed using Chord-X multi-strand CV-4 PTFE pre-measured loops.  The appropriate cord length (50mm) was measured from corresponding normal length primary cords from the P1 segment of the posterior leaflet. The papillary muscle suture of a Chord-X multi-strand suture was placed through the head of the anterior papillary muscle in a horizontal mattress fashion and tied over Teflon felt pledgets. Each of the three pre-measured loops were then reimplanted into the free margin of the P2 segment of the posterior leaflet on the anterior side of midline.   Interrupted 2-0 Ethibond horizontal mattress sutures are placed circumferentially around the entire mitral valve annulus. The sutures will ultimately be utilized for ring annuloplasty, and at this juncture there are utilized to suspend the valve symmetrically.  The valve was tested with saline and appeared competent even without ring annuloplasty complete. The valve was sized to a 30 mm annuloplasty ring, based upon the transverse distance between the left and right commissures and the height of the anterior leaflet, corresponding to a size just slightly larger than the overall surface area of the anterior leaflet.  A Medtronic Simuform annuloplasty ring (size 78mm, model [id number], serial [id number]) was secured in place uneventfully.  All ring sutures were secured using a Cor-knot device.    The valve was tested with saline and appeared competent. There is no residual leak. There was a broad, symmetrical line of coaptation of the anterior and posterior leaflet which was confirmed using the blue ink test.  Rewarming is begun.   Procedure Completion:  The atriotomy was closed using a 2-layer closure of running 3-0 Prolene suture after placing a sump drain across the mitral valve to serve as a left ventricular vent.  One final dose of warm retrograde "reanimation dose" cardioplegia was administered retrograde through the coronary sinus catheter while all air was evacuated through the aortic root.  The aortic cross clamp was removed after a total cross clamp time of 82 minutes.  Epicardial pacing wires are fixed to the inferior wall of the right ventricule and to the right atrial appendage. The patient is rewarmed to 37C temperature. The left ventricular vent and antegrade cardioplegia cannula are removed.  The pericardial sac was drained using a 32 French Bard drain placed through the anterior port incision. The patient is weaned and disconnected from cardiopulmonary bypass.  The patient's rhythm at separation from bypass was sinus.  The patient was weaned from bypass without any inotropic support. Total cardiopulmonary bypass time for the operation was 126 minutes.  Followup transesophageal echocardiogram performed after separation from bypass revealed a well-seated annuloplasty ring in the mitral position with a normal functioning mitral valve. There was no residual leak.  Left ventricular function was unchanged from preoperatively.  The mean gradient across the mitral valve was estimated to be 3 mmHg.  The femoral arterial and venous cannulas were removed and all Perclose sutures  secured.  Manual pressure was maintained while Protamine was administered.  The right internal jugular cannula was removed and manual pressure held on the  neck and groin for 15 minutes.  Single lung ventilation was begun. The atriotomy closure was inspected for hemostasis.  The right pleural space is irrigated with saline solution and inspected for hemostasis.   A mixture of Exparel liposomal bupivacaine (20 mL) and 0.5% bupivacaine (30 mL) is utilized to create an intercostal nerve block for postoperative analgesia.  The mixture is injected under direct vision into the intercostal neurovascular bundles posteriorly to cover the second through the sixth intercostal nerve roots.  Portions of the solution are also injected into the intercostal neurovascular bundles immediately surrounding the surgical incision and immediately adjacent to the chest tube exit sites.  The right pleural space was drained using a 32 French Bard drain placed through the posterior port incision. The miniature thoracotomy incision was closed in multiple layers in routine fashion.   The post-bypass portion of the operation was notable for stable rhythm and hemodynamics.  Low-dose milrinone infusion was initiated because of decreased left ventricular ejection fraction with borderline low cardiac output.  No blood products were administered during the operation.   Disposition:  The patient tolerated the procedure well.  The patient was extubated in the operating room and subsequently transported to the surgical intensive care unit in stable condition. There were no intraoperative complications. All sponge instrument and needle counts are verified correct at completion of the operation.     Valentina Gu. Roxy Manns MD 05/21/2021 1:10 PM

## 2021-05-21 NOTE — Anesthesia Postprocedure Evaluation (Signed)
Anesthesia Post Note  Patient: [name] [name]  Procedure(s) Performed: MINIMALLY INVASIVE MITRAL VALVE REPAIR (MVR) USING MEDTRONIC SIMUFORM 30MM RING (Chest) CLIPPING OF ATRIAL APPENDAGE USING ATRICURE 40MM PRO2 CLIP (Chest) TRANSESOPHAGEAL ECHOCARDIOGRAM (TEE)     Patient location during evaluation: SICU Anesthesia Type: General Level of consciousness: oriented and patient cooperative Pain management: pain level controlled Vital Signs Assessment: post-procedure vital signs reviewed and stable Respiratory status: patient connected to face mask oxygen Cardiovascular status: stable Postop Assessment: no apparent nausea or vomiting Anesthetic complications: no   No notable events documented.  Last Vitals:  Vitals:   05/21/21 1514 05/21/21 1515  BP:    Pulse: 80 80  Resp: 17 18  Temp: (!) 36.1 C (!) 36.1 C  SpO2: 98% 98%    Last Pain:  Vitals:   05/21/21 1500  TempSrc: Core  PainSc: 0-No pain                 Belenda Cruise P Neenah Canter

## 2021-05-21 NOTE — Anesthesia Procedure Notes (Signed)
Central Venous Catheter Insertion Performed by: Darral Dash, DO, anesthesiologist Start/End6/28/2022 7:21 AM, 05/21/2021 7:23 AM Patient location: Pre-op. Preanesthetic checklist: patient identified, IV checked, site marked, risks and benefits discussed, surgical consent, monitors and equipment checked, pre-op evaluation, timeout performed and anesthesia consent Position: supine Hand hygiene performed  and maximum sterile barriers used  PA cath was placed.Swan type:thermodilution PA Cath depth:50 Procedure performed without using ultrasound guided technique. Attempts: 1 Patient tolerated the procedure well with no immediate complications.

## 2021-05-21 NOTE — Progress Notes (Signed)
[facility] BRIEF SICU PROGRESS NOTE  Day of Surgery  S/P Procedure(s) (LRB): MINIMALLY INVASIVE MITRAL VALVE REPAIR (MVR) USING MEDTRONIC SIMUFORM 30MM RING CLIPPING OF ATRIAL APPENDAGE USING ATRICURE 40MM PRO2 CLIP (N/A) TRANSESOPHAGEAL ECHOCARDIOGRAM (TEE) (N/A)   Doing well early postop.  No complaints Sinus brady - AAI paced w/ stable hemodynamics on low dose milrinone Breathing comfortably w/ O2 sats 99% Chest tube output low UOP > 100 mL/hr  Plan: Continue routine early postop  Rexene Alberts, MD 05/21/2021 6:05 PM

## 2021-05-22 ENCOUNTER — Other Ambulatory Visit: Payer: Self-pay

## 2021-05-22 ENCOUNTER — Inpatient Hospital Stay (HOSPITAL_COMMUNITY): Payer: PPO

## 2021-05-22 DIAGNOSIS — I340 Nonrheumatic mitral (valve) insufficiency: Secondary | ICD-10-CM

## 2021-05-22 DIAGNOSIS — Z9889 Other specified postprocedural states: Secondary | ICD-10-CM

## 2021-05-22 LAB — CBC
HCT: 27.2 % — ABNORMAL LOW (ref 36.0–46.0)
HCT: 27.1 % — ABNORMAL LOW (ref 36.0–46.0)
Hemoglobin: 9.1 g/dL — ABNORMAL LOW (ref 12.0–15.0)
Hemoglobin: 8.7 g/dL — ABNORMAL LOW (ref 12.0–15.0)
MCH: 33.5 pg (ref 26.0–34.0)
MCH: 32.7 pg (ref 26.0–34.0)
MCHC: 33.5 g/dL (ref 30.0–36.0)
MCHC: 32.1 g/dL (ref 30.0–36.0)
MCV: 100 fL (ref 80.0–100.0)
MCV: 101.9 fL — ABNORMAL HIGH (ref 80.0–100.0)
Platelets: 91 10*3/uL — ABNORMAL LOW (ref 150–400)
Platelets: 89 10*3/uL — ABNORMAL LOW (ref 150–400)
RBC: 2.72 MIL/uL — ABNORMAL LOW (ref 3.87–5.11)
RBC: 2.66 MIL/uL — ABNORMAL LOW (ref 3.87–5.11)
RDW: 13.2 % (ref 11.5–15.5)
RDW: 13.4 % (ref 11.5–15.5)
WBC: 11.9 10*3/uL — ABNORMAL HIGH (ref 4.0–10.5)
WBC: 15.5 10*3/uL — ABNORMAL HIGH (ref 4.0–10.5)
nRBC: 0 % (ref 0.0–0.2)
nRBC: 0 % (ref 0.0–0.2)

## 2021-05-22 LAB — BASIC METABOLIC PANEL
Anion gap: 3 — ABNORMAL LOW (ref 5–15)
Anion gap: 9 (ref 5–15)
BUN: 10 mg/dL (ref 8–23)
BUN: 10 mg/dL (ref 8–23)
CO2: 22 mmol/L (ref 22–32)
CO2: 21 mmol/L — ABNORMAL LOW (ref 22–32)
Calcium: 7 mg/dL — ABNORMAL LOW (ref 8.9–10.3)
Calcium: 7 mg/dL — ABNORMAL LOW (ref 8.9–10.3)
Chloride: 114 mmol/L — ABNORMAL HIGH (ref 98–111)
Chloride: 106 mmol/L (ref 98–111)
Creatinine, Ser: 0.7 mg/dL (ref 0.44–1.00)
Creatinine, Ser: 0.91 mg/dL (ref 0.44–1.00)
GFR, Estimated: >60 mL/min (ref >60)
GFR, Estimated: >60 mL/min (ref >60)
Glucose, Bld: 135 mg/dL — ABNORMAL HIGH (ref 70–99)
Glucose, Bld: 172 mg/dL — ABNORMAL HIGH (ref 70–99)
Potassium: 3.9 mmol/L (ref 3.5–5.1)
Potassium: 3.8 mmol/L (ref 3.5–5.1)
Sodium: 139 mmol/L (ref 135–145)
Sodium: 136 mmol/L (ref 135–145)

## 2021-05-22 LAB — GLUCOSE, CAPILLARY
Glucose-Capillary: 108 mg/dL — ABNORMAL HIGH (ref 70–99)
Glucose-Capillary: 125 mg/dL — ABNORMAL HIGH (ref 70–99)
Glucose-Capillary: 134 mg/dL — ABNORMAL HIGH (ref 70–99)
Glucose-Capillary: 136 mg/dL — ABNORMAL HIGH (ref 70–99)
Glucose-Capillary: 138 mg/dL — ABNORMAL HIGH (ref 70–99)
Glucose-Capillary: 139 mg/dL — ABNORMAL HIGH (ref 70–99)
Glucose-Capillary: 144 mg/dL — ABNORMAL HIGH (ref 70–99)
Glucose-Capillary: 145 mg/dL — ABNORMAL HIGH (ref 70–99)
Glucose-Capillary: 147 mg/dL — ABNORMAL HIGH (ref 70–99)
Glucose-Capillary: 160 mg/dL — ABNORMAL HIGH (ref 70–99)
Glucose-Capillary: 162 mg/dL — ABNORMAL HIGH (ref 70–99)
Glucose-Capillary: 183 mg/dL — ABNORMAL HIGH (ref 70–99)
Glucose-Capillary: 203 mg/dL — ABNORMAL HIGH (ref 70–99)

## 2021-05-22 LAB — MAGNESIUM
Magnesium: 2.8 mg/dL — ABNORMAL HIGH (ref 1.7–2.4)
Magnesium: 2.5 mg/dL — ABNORMAL HIGH (ref 1.7–2.4)

## 2021-05-22 LAB — COOXEMETRY PANEL
Carboxyhemoglobin: 0.9 % (ref 0.5–1.5)
Methemoglobin: 0.7 % (ref 0.0–1.5)
O2 Saturation: 67.4 %
Total hemoglobin: 9.1 g/dL — ABNORMAL LOW (ref 12.0–16.0)

## 2021-05-22 MED ORDER — FUROSEMIDE 10 MG/ML IJ SOLN
20.0000 mg | Freq: Two times a day (BID) | INTRAMUSCULAR | Status: DC
  Administered 2021-05-22 (×2): 20 mg via INTRAVENOUS
  Filled 2021-05-22 (×2): qty 2

## 2021-05-22 MED ORDER — POTASSIUM CHLORIDE 10 MEQ/50ML IV SOLN
10.0000 meq | INTRAVENOUS | Status: AC
  Administered 2021-05-22 (×2): 10 meq via INTRAVENOUS
  Filled 2021-05-22 (×2): qty 50

## 2021-05-22 MED ORDER — POTASSIUM CHLORIDE 10 MEQ/50ML IV SOLN
10.0000 meq | INTRAVENOUS | Status: AC
  Administered 2021-05-22 (×3): 10 meq via INTRAVENOUS
  Filled 2021-05-22 (×3): qty 50

## 2021-05-22 MED ORDER — LIDOCAINE 5 % EX PTCH
2.0000 | MEDICATED_PATCH | CUTANEOUS | Status: DC
  Administered 2021-05-22 – 2021-05-23 (×2): 2 via TRANSDERMAL
  Filled 2021-05-22 (×3): qty 2

## 2021-05-22 MED ORDER — ASPIRIN EC 325 MG PO TBEC
325.0000 mg | DELAYED_RELEASE_TABLET | Freq: Every day | ORAL | Status: AC
  Administered 2021-05-22: 325 mg via ORAL
  Filled 2021-05-22: qty 1

## 2021-05-22 MED ORDER — WARFARIN SODIUM 2.5 MG PO TABS
2.5000 mg | ORAL_TABLET | Freq: Every day | ORAL | Status: DC
  Administered 2021-05-22: 2.5 mg via ORAL
  Filled 2021-05-22: qty 1

## 2021-05-22 MED ORDER — COUMADIN BOOK
Freq: Once | Status: AC
  Filled 2021-05-22: qty 1

## 2021-05-22 MED ORDER — ASPIRIN EC 81 MG PO TBEC
81.0000 mg | DELAYED_RELEASE_TABLET | Freq: Every day | ORAL | Status: DC
  Administered 2021-05-23 – 2021-05-26 (×4): 81 mg via ORAL
  Filled 2021-05-22 (×4): qty 1

## 2021-05-22 MED ORDER — METOCLOPRAMIDE HCL 5 MG/ML IJ SOLN
10.0000 mg | Freq: Four times a day (QID) | INTRAMUSCULAR | Status: AC
  Administered 2021-05-22 (×4): 10 mg via INTRAVENOUS
  Filled 2021-05-22 (×4): qty 2

## 2021-05-22 MED ORDER — ENOXAPARIN SODIUM 30 MG/0.3ML IJ SOSY: 30.0000 mg | PREFILLED_SYRINGE | Freq: Every day | INTRAMUSCULAR | Status: DC

## 2021-05-22 MED ORDER — INSULIN ASPART 100 UNIT/ML IJ SOLN
0.0000 [IU] | INTRAMUSCULAR | Status: DC
  Administered 2021-05-22 (×2): 4 [IU] via SUBCUTANEOUS
  Administered 2021-05-22: 8 [IU] via SUBCUTANEOUS

## 2021-05-22 MED ORDER — WARFARIN - PHYSICIAN DOSING INPATIENT: Freq: Every day | Status: DC

## 2021-05-22 MED ORDER — ATORVASTATIN CALCIUM 10 MG PO TABS
20.0000 mg | ORAL_TABLET | Freq: Every day | ORAL | Status: DC
  Administered 2021-05-24 – 2021-05-25 (×2): 20 mg via ORAL
  Filled 2021-05-22 (×2): qty 2

## 2021-05-22 MED ORDER — GABAPENTIN 100 MG PO CAPS
100.0000 mg | ORAL_CAPSULE | Freq: Every day | ORAL | Status: DC
  Administered 2021-05-22 – 2021-05-25 (×4): 100 mg via ORAL
  Filled 2021-05-22 (×4): qty 1

## 2021-05-22 MED ORDER — INSULIN ASPART 100 UNIT/ML IJ SOLN: 0.0000 [IU] | INTRAMUSCULAR | Status: DC

## 2021-05-22 MED FILL — Lidocaine HCl Local Preservative Free (PF) Inj 2%: INTRAMUSCULAR | Qty: 15 | Status: AC

## 2021-05-22 MED FILL — Heparin Sodium (Porcine) Inj 1000 Unit/ML: INTRAMUSCULAR | Qty: 30 | Status: AC

## 2021-05-22 MED FILL — Potassium Chloride Inj 2 mEq/ML: INTRAVENOUS | Qty: 40 | Status: AC

## 2021-05-22 NOTE — Plan of Care (Signed)

## 2021-05-22 NOTE — Progress Notes (Signed)
[facility] DAILY ICU PROGRESS NOTE                   [address]            [place] [address]          [phone]   1 Day Post-Op Procedure(s) (LRB): MINIMALLY INVASIVE MITRAL VALVE REPAIR (MVR) USING MEDTRONIC SIMUFORM 30MM RING CLIPPING OF ATRIAL APPENDAGE USING ATRICURE 40MM PRO2 CLIP (N/A) TRANSESOPHAGEAL ECHOCARDIOGRAM (TEE) (N/A)  Total Length of Stay:  LOS: 1 day   Subjective: Awake and alert, no complaints.   Extubated prior to leaving the OR yesterday.  No events overnight.  Objective: Vital signs in last 24 hours: Temp:  [95.18 F (35.1 C)-99.5 F (37.5 C)] 99.1 F (37.3 C) (06/29 0700) Pulse Rate:  [79-89] 89 (06/29 0700) Resp:  [14-27] 19 (06/29 0700) BP: (83-119)/(52-79) 102/58 (06/29 0700) SpO2:  [95 %-100 %] 95 % (06/29 0700) Arterial Line BP: (100-149)/(45-63) 141/51 (06/29 0700) Weight:  [66.9 kg] 66.9 kg (06/29 0500)  Filed Weights   05/21/21 0559 05/22/21 0500  Weight: 59.4 kg 66.9 kg    Weight change: 7.479 kg   Hemodynamic parameters for last 24 hours: PAP: (16-35)/(7-19) 30/11 CO:  [4.5 L/min-6 L/min] 5.3 L/min CI:  [2.8 L/min/m2-3.8 L/min/m2] 3.3 L/min/m2  Intake/Output from previous day: 06/28 0701 - 06/29 0700 In: 5973.8 [I.V.:3887.7; Blood:287; IV Piggyback:1799.1] Out: 7782 [UMPNT:6144; Emesis/NG output:3; Chest Tube:610]  Intake/Output this shift: No intake/output data recorded.  Current Meds: Scheduled Meds:  acetaminophen  1,000 mg Oral Q6H   aspirin EC  325 mg Oral Daily   [START ON 05/23/2021] aspirin EC  81 mg Oral Daily   [START ON 05/24/2021] atorvastatin  20 mg Oral QHS   bisacodyl  10 mg Oral Daily   Or   bisacodyl  10 mg Rectal Daily   chlorhexidine  15 mL Mouth Rinse BID   Chlorhexidine Gluconate Cloth  6 each Topical Q0600   docusate sodium  200 mg Oral Daily   [START ON 05/23/2021] enoxaparin (LOVENOX) injection  30 mg Subcutaneous QHS   furosemide  20 mg Intravenous BID   gabapentin  100 mg Oral QHS    insulin aspart  0-24 Units Subcutaneous Q4H   mouth rinse  15 mL Mouth Rinse q12n4p   metoCLOPramide (REGLAN) injection  10 mg Intravenous Q6H   [START ON 05/23/2021] pantoprazole  40 mg Oral Daily   sodium chloride flush  3 mL Intravenous Q12H   warfarin  2.5 mg Oral q1600   Warfarin - Physician Dosing Inpatient   Does not apply q1600   Continuous Infusions:  sodium chloride 250 mL (05/22/21 0505)   albumin human 250 mL/hr at 05/22/21 0600    ceFAZolin (ANCEF) IV 200 mL/hr at 05/22/21 0600   lactated ringers     lactated ringers     milrinone 0.25 mcg/kg/min (05/22/21 0700)   potassium chloride 10 mEq (05/22/21 0704)   promethazine (PHENERGAN) injection (IM or IVPB) 200 mL/hr at 05/22/21 0600   PRN Meds:.albumin human, metoprolol tartrate, morphine injection, ondansetron (ZOFRAN) IV, oxyCODONE, promethazine (PHENERGAN) injection (IM or IVPB), sodium chloride flush, traMADol  General appearance: alert, cooperative, and no distress Neurologic: intact Heart: Regular rate and rhythm.  She is currently a paced.  Intrinsic rhythm is NSR at a rate of 64.  No murmur. Lungs: Breath sounds are clear, mildly diminished both bases.  Chest tube output is serosanguineous, approximately 600 mL past 24 hours Abdomen: Soft and  nontender Extremities: All warm and well-perfused.  She has some edema in her hands, minimal edema in the lower extremities.  The right femoral cannulation sites are soft and hemostatic Wound: The right chest incision is covered with a dry Aquacel dressing.  Lab Results: CBC: Recent Labs    05/21/21 1923 05/22/21 0400  WBC 13.3* 11.9*  HGB 9.9* 9.1*  HCT 30.1* 27.2*  PLT 108* 91*   BMET:  Recent Labs    05/21/21 1923 05/22/21 0400  NA 140 139  K 3.7 3.9  CL 111 114*  CO2 22 22  GLUCOSE 188* 135*  BUN 11 10  CREATININE 0.74 0.70  CALCIUM 6.9* 7.0*    CMET: Lab Results  Component Value Date   WBC 11.9 (H) 05/22/2021   HGB 9.1 (L) 05/22/2021   HCT 27.2  (L) 05/22/2021   PLT 91 (L) 05/22/2021   GLUCOSE 135 (H) 05/22/2021   CHOL 176 02/19/2021   TRIG 153.0 (H) 02/19/2021   HDL 49.50 02/19/2021   LDLDIRECT 75.0 11/02/2019   LDLCALC 96 02/19/2021   ALT 25 05/17/2021   AST 21 05/17/2021   NA 139 05/22/2021   K 3.9 05/22/2021   CL 114 (H) 05/22/2021   CREATININE 0.70 05/22/2021   BUN 10 05/22/2021   CO2 22 05/22/2021   TSH 0.92 02/19/2021   INR 1.1 05/17/2021   HGBA1C 5.6 02/19/2021      PT/INR: No results for input(s): LABPROT, INR in the last 72 hours. Radiology: [facility] Chest Port 1 View  Result Date: 05/22/2021 CLINICAL DATA:  Respiratory distress EXAM: PORTABLE CHEST 1 VIEW COMPARISON:  05/21/2021 FINDINGS: Left internal jugular Swan-Ganz catheter is seen with its tip within the a terminal right pulmonary artery. Dual right chest tubes are in place with interval development of a tiny right apical pneumothorax, new since prior examination Pulmonary insufflation has decreased since prior examination. Resultant vascular crowding at the hila. Small left pleural effusion has developed. Mitral valve replacement and left atrial clipping has been performed. Cardiac size within normal limits. Pulmonary vascularity is normal. IMPRESSION: Dual right chest tubes in place. Interval development of a tiny right apical pneumothorax. Decreasing pulmonary insufflation.  Lung volumes are small. Interval development of small left pleural effusion. Stable Swan-Ganz catheter. Electronically Signed   By: Fidela Salisbury MD   On: 05/22/2021 06:23   DG Chest Port 1 View  Result Date: 05/21/2021 CLINICAL DATA:  Atelectasis EXAM: PORTABLE CHEST 1 VIEW COMPARISON:  05/17/2021 FINDINGS: Interval postsurgical changes of the mediastinum including atrial appendage clip and valve prosthesis. Placement of left-sided IJ Swan-Ganz catheter with tip projecting over right pulmonary artery. Insertion of chest drainage catheters, 1 with tip over the right apex and the other with  tip over the left lower chest. Cardiomegaly with mild central congestion. Mild subsegmental atelectasis left base. No pleural effusion or pneumothorax. IMPRESSION: 1. Interval postsurgical changes of the mediastinum with placement of support lines and tubes as above. 2. Cardiomegaly with mild central congestion. 3. Mild subsegmental atelectasis left base. Negative for pneumothorax. Electronically Signed   By: Donavan Foil M.D.   On: 05/21/2021 14:55     Assessment/Plan: S/P Procedure(s) (LRB): MINIMALLY INVASIVE MITRAL VALVE REPAIR (MVR) USING MEDTRONIC SIMUFORM 30MM RING CLIPPING OF ATRIAL APPENDAGE USING ATRICURE 40MM PRO2 CLIP (N/A) TRANSESOPHAGEAL ECHOCARDIOGRAM (TEE) (N/A)  -Postop day 1 minimally invasive mitral valve repair for severe MR.  She is hemodynamically stable.  She is not requiring any vasopressor support.  DC monitoring lines and mobilize.  Begin low-dose Coumadin anticoagulation.  Mobilize.  Begin clear liquid diet.  -Mild expected acute blood loss anemia and thrombocytopenia.  Chest tube drainage is tapering off.  Will monitor.  -Endo-history of prediabetes.  Glucose has been well controlled with insulin drip.  Transition to sliding scale coverage.  -Volume excess-weight is about 7 kg above preop.  We will begin gentle diuresis today.  -DVT prophylaxis-subcu enoxaparin to begin later today.   Antony Odea, PA-C [phone] 05/22/2021 7:39 AM

## 2021-05-22 NOTE — Progress Notes (Signed)
EVENING ROUNDS NOTE :     [address]       ,[place] [address]             [phone]                 1 Day Post-Op Procedure(s) (LRB): MINIMALLY INVASIVE MITRAL VALVE REPAIR (MVR) USING MEDTRONIC SIMUFORM 30MM RING CLIPPING OF ATRIAL APPENDAGE USING ATRICURE 40MM PRO2 CLIP (N/A) TRANSESOPHAGEAL ECHOCARDIOGRAM (TEE) (N/A)   Total Length of Stay:  LOS: 1 day  Events:   No events Soft pressures On milr    BP 116/63   Pulse 82   Temp 98.2 F (36.8 C) (Oral)   Resp 15   Ht 5\' 2"  (1.575 m)   Wt 66.9 kg   SpO2 97%   BMI 26.98 kg/m   PAP: (21-36)/(8-12) 30/10 CO:  [5.3 L/min-6 L/min] 5.3 L/min CI:  [3.3 L/min/m2-3.8 L/min/m2] 3.3 L/min/m2      sodium chloride 250 mL (05/22/21 0505)    ceFAZolin (ANCEF) IV Stopped (05/22/21 1547)   lactated ringers     lactated ringers     milrinone 0.25 mcg/kg/min (05/22/21 1800)   promethazine (PHENERGAN) injection (IM or IVPB) 200 mL/hr at 05/22/21 1800    I/O last 3 completed shifts: In: 5973.8 [I.V.:3887.7; Blood:287; IV Piggyback:1799.1] Out: 9629 [BMWUX:3244; Emesis/NG output:3; Chest Tube:610]   CBC Latest Ref Rng & Units 05/22/2021 05/22/2021 05/21/2021  WBC 4.0 - 10.5 K/uL 15.5(H) 11.9(H) 13.3(H)  Hemoglobin 12.0 - 15.0 g/dL 8.7(L) 9.1(L) 9.9(L)  Hematocrit 36.0 - 46.0 % 27.1(L) 27.2(L) 30.1(L)  Platelets 150 - 400 K/uL PENDING 91(L) 108(L)    BMP Latest Ref Rng & Units 05/22/2021 05/21/2021 05/21/2021  Glucose 70 - 99 mg/dL 135(H) 188(H) -  BUN 8 - 23 mg/dL 10 11 -  Creatinine 0.44 - 1.00 mg/dL 0.70 0.74 -  BUN/Creat Ratio 12 - 28 - - -  Sodium 135 - 145 mmol/L 139 140 142  Potassium 3.5 - 5.1 mmol/L 3.9 3.7 3.8  Chloride 98 - 111 mmol/L 114(H) 111 -  CO2 22 - 32 mmol/L 22 22 -  Calcium 8.9 - 10.3 mg/dL 7.0(L) 6.9(L) -    ABG    Component Value Date/Time   PHART 7.423 05/21/2021 1221   PCO2ART 32.7 05/21/2021 1221   PO2ART 132 (H) 05/21/2021 1221   HCO3 21.3 05/21/2021 1221   TCO2 22  05/21/2021 1221   ACIDBASEDEF 3.0 (H) 05/21/2021 1221   O2SAT 67.4 05/22/2021 0407       Melodie Bouillon, MD 05/22/2021 6:28 PM

## 2021-05-22 NOTE — Discharge Instructions (Addendum)
You are on Coumadin for your Mitral Valve Repair.  This medication will require blood checks to ensure your on the appropriate regimen.  You will need to have this level checked on Tuesday for Wednesday next week (7/5 or 7/6).  I have sent an appointment request to the Cardiology office.  Should you not hear from them please contact Dr. Kennon Holter office on [address] to get a PT/INR level drawn.   Should you have any difficulty please contact our office at [phone]    Discharge Instructions:  1. You may shower, please wash incisions daily with soap and water and keep dry.  If you wish to cover wounds with dressing you may do so but please keep clean and change daily.  No tub baths or swimming until incisions have completely healed.  If your incisions become red or develop any drainage please call our office at [phone]  2. No Driving until cleared by Dr. Guy Sandifer office and you are no longer using narcotic pain medications  3. Monitor your weight daily.. Please use the same scale and weigh at same time... If you gain 5-10 lbs in 48 hours with associated lower extremity swelling, please contact our office at [phone]  4. Fever of 101.5 for at least 24 hours with no source, please contact our office at [phone]  5. Activity- up as tolerated, please walk at least 3 times per day.  Avoid strenuous activity, no lifting, pushing, or pulling with your arms over 8-10 lbs for a minimum of 6 weeks  6. If any questions or concerns arise, please do not hesitate to contact our office at [phone]   Information on my medicine - Coumadin   (Warfarin)  Why was Coumadin prescribed for you? Coumadin was prescribed for you because you have a blood clot or a medical condition that can cause an increased risk of forming blood clots. Blood clots can cause serious health problems by blocking the flow of blood to the heart, lung, or brain. Coumadin can prevent harmful blood clots from  forming. As a reminder your indication for Coumadin is:   valve surgery  What test will check on my response to Coumadin? While on Coumadin (warfarin) you will need to have an INR test regularly to ensure that your dose is keeping you in the desired range. The INR (international normalized ratio) number is calculated from the result of the laboratory test called prothrombin time (PT).  If an INR APPOINTMENT HAS NOT ALREADY BEEN MADE FOR YOU please schedule an appointment to have this lab work done by your health care provider within 7 days. Your INR goal is usually a number between:  2 to 3 or your provider may give you a more narrow range like 2-2.5.  Ask your health care provider during an office visit what your goal INR is.  What  do you need to  know  About  COUMADIN? Take Coumadin (warfarin) exactly as prescribed by your healthcare provider about the same time each day.  DO NOT stop taking without talking to the doctor who prescribed the medication.  Stopping without other blood clot prevention medication to take the place of Coumadin may increase your risk of developing a new clot or stroke.  Get refills before you run out.  What do you do if you miss a dose? If you miss a dose, take it as soon as you remember on the same day then continue your regularly scheduled regimen the next day.  Do not take  two doses of Coumadin at the same time.  Important Safety Information A possible side effect of Coumadin (Warfarin) is an increased risk of bleeding. You should call your healthcare provider right away if you experience any of the following: Bleeding from an injury or your nose that does not stop. Unusual colored urine (red or dark brown) or unusual colored stools (red or black). Unusual bruising for unknown reasons. A serious fall or if you hit your head (even if there is no bleeding).  Some foods or medicines interact with Coumadin (warfarin) and might alter your response to warfarin. To  help avoid this: Eat a balanced diet, maintaining a consistent amount of Vitamin K. Notify your provider about major diet changes you plan to make. Avoid alcohol or limit your intake to 1 drink for women and 2 drinks for men per day. (1 drink is 5 oz. wine, 12 oz. beer, or 1.5 oz. liquor.)  Make sure that ANY health care provider who prescribes medication for you knows that you are taking Coumadin (warfarin).  Also make sure the healthcare provider who is monitoring your Coumadin knows when you have started a new medication including herbals and non-prescription products.  Coumadin (Warfarin)  Major Drug Interactions  Increased Warfarin Effect Decreased Warfarin Effect  Alcohol (large quantities) Antibiotics (esp. Septra/Bactrim, Flagyl, Cipro) Amiodarone (Cordarone) Aspirin (ASA) Cimetidine (Tagamet) Megestrol (Megace) NSAIDs (ibuprofen, naproxen, etc.) Piroxicam (Feldene) Propafenone (Rythmol SR) Propranolol (Inderal) Isoniazid (INH) Posaconazole (Noxafil) Barbiturates (Phenobarbital) Carbamazepine (Tegretol) Chlordiazepoxide (Librium) Cholestyramine (Questran) Griseofulvin Oral Contraceptives Rifampin Sucralfate (Carafate) Vitamin K   Coumadin (Warfarin) Major Herbal Interactions  Increased Warfarin Effect Decreased Warfarin Effect  Garlic Ginseng Ginkgo biloba Coenzyme Q10 Green tea St. John's wort    Coumadin (Warfarin) FOOD Interactions  Eat a consistent number of servings per week of foods HIGH in Vitamin K (1 serving =  cup)  Collards (cooked, or boiled & drained) Kale (cooked, or boiled & drained) Mustard greens (cooked, or boiled & drained) Parsley *serving size only =  cup Spinach (cooked, or boiled & drained) Swiss chard (cooked, or boiled & drained) Turnip greens (cooked, or boiled & drained)  Eat a consistent number of servings per week of foods MEDIUM-HIGH in Vitamin K (1 serving = 1 cup)  Asparagus (cooked, or boiled & drained) Broccoli  (cooked, boiled & drained, or raw & chopped) Brussel sprouts (cooked, or boiled & drained) *serving size only =  cup Lettuce, raw (green leaf, endive, romaine) Spinach, raw Turnip greens, raw & chopped   These websites have more information on Coumadin (warfarin):  FailFactory.se; VeganReport.com.au;

## 2021-05-22 NOTE — Hospital Course (Addendum)
History of Present Illness  Patient is 75 year old female with history of hypertension, hyperlipidemia, degenerative arthritis, PVCs, migraine headaches, irritable bowel syndrome, GE reflux disease, and hiatal hernia who has been referred for surgical consultation to discuss treatment options for recently discovered mitral valve prolapse with severe mitral regurgitation.   Patient denies any known history of heart murmur until recently.  She has been evaluated in the past for symptoms of atypical chest pain and palpitations.  Over the past 2 years she has developed progressive symptoms of exertional shortness of breath and fatigue.  She was recently noted to have a systolic murmur on physical exam by her son who is a primary care physician.  She was referred to Dr. Gwenlyn [name] for cardiology consultation and transthoracic echocardiogram performed February 27, 2021 revealed mitral valve prolapse with at least moderate and possibly severe mitral regurgitation.  Left ventricular systolic function was reported to be normal with ejection fraction estimated 55 to 60%.  There was moderate left atrial enlargement.  The patient subsequently underwent transesophageal echocardiogram March 14, 2021 which confirmed the presence of mitral valve prolapse and severe mitral regurgitation.  There was systolic flow reversal in the pulmonary veins.  There was moderate left atrial enlargement and normal left ventricular function with ejection fraction estimated 60 to 65%.  Diagnostic cardiac catheterization was performed by Dr. Gwenlyn [name] and revealed normal coronary artery anatomy with no significant coronary artery disease.  Pulmonary artery pressures were mild to moderately elevated.  Cardiothoracic surgical consultation was requested.   Patient is married and lives locally in [name] with her husband.  She is originally from [name] but has lived in the [name] for more than 30 years.  She is a retired Radio producer.  She remained  reasonably active physically although she admits that she does not walk as much as she used to in the past because of degenerative arthritis in both knees.  More recently she has been limited primarily by shortness of breath.  Symptoms began approximately 2 years ago and have progressed.  She now intermittently gets short of breath with low-level activity but she denies resting shortness of breath.  She denies any history of orthopnea but she has had occasional episodes of PND.  She reports occasional tachypalpitations without dizziness or syncope.  She has not had any exertional chest pain or chest tightness.  She denies any history of lower extremity edema.  Shortness of breath is exacerbated by lying on her left side. Mitral valve repair was offered to the patient and discussed with her and her family in detail by Dr. Roxy [name].  She like to proceed with surgery.   Hospital Course:  [name] was admitted to the hospital for elective surgery on 05/21/2021.  She was prepared and taken to the OR where minimally invasive mitral valve repair was accomplished along with clipping of the left atrial appendage.  Please see the operative note below for details.  Following the procedure, she separated from cardiopulmonary bypass without any difficulty and was extubated in the operating room.  She was transferred to the surgical ICU in stable condition.  Vital signs and hemodynamics remained stable.  The monitoring lines were removed on the first postoperative day.  She was mobilized and made satisfactory progress.  On postop day 1, she was in a sinus rhythm with heart rate in the mid 60s.  Atrial pacing was utilized on post-op day 1 for improved hemodynamics.  By the following day, she was in [place] in the 80's.  Anticoagulation with coumadin was initiated and daily INR's were monitored. She was started back on her Cozaar at reduced dose on the second post-op day along with low-dose metoprolol. She was transferred to 4E  Progressive Care. Activity was advanced and she made excellent progress. She developed atrial fibrillation on post-op day 3 with v-rates in the 140's. IV amiodarone loading was initiated. The chest tubes and pacing wires were removed on post-op day 3. The patient converted to NSR.  Her Amiodarone was transitioned to an oral regimen.  Her magnesium level was low and she was supplemented accordingly.  Her potassium was at 3.5 and her supplementation was also increased.  She remains on coumadin at 1 mg daily.  Her most recent INR is 2.3.  She is ambulating without difficulty.  Her surgical incisions are healing without evidence of infection.  She is medically stable for discharge home today.

## 2021-05-23 ENCOUNTER — Inpatient Hospital Stay (HOSPITAL_COMMUNITY): Payer: PPO

## 2021-05-23 ENCOUNTER — Encounter (HOSPITAL_COMMUNITY): Payer: Self-pay | Admitting: Thoracic Surgery (Cardiothoracic Vascular Surgery)

## 2021-05-23 LAB — BASIC METABOLIC PANEL
Anion gap: 7 (ref 5–15)
BUN: 13 mg/dL (ref 8–23)
CO2: 23 mmol/L (ref 22–32)
Calcium: 7.2 mg/dL — ABNORMAL LOW (ref 8.9–10.3)
Chloride: 102 mmol/L (ref 98–111)
Creatinine, Ser: 0.76 mg/dL (ref 0.44–1.00)
GFR, Estimated: >60 mL/min (ref >60)
Glucose, Bld: 104 mg/dL — ABNORMAL HIGH (ref 70–99)
Potassium: 4 mmol/L (ref 3.5–5.1)
Sodium: 132 mmol/L — ABNORMAL LOW (ref 135–145)

## 2021-05-23 LAB — GLUCOSE, CAPILLARY
Glucose-Capillary: 111 mg/dL — ABNORMAL HIGH (ref 70–99)
Glucose-Capillary: 171 mg/dL — ABNORMAL HIGH (ref 70–99)
Glucose-Capillary: 129 mg/dL — ABNORMAL HIGH (ref 70–99)

## 2021-05-23 LAB — CBC
HCT: 26.1 % — ABNORMAL LOW (ref 36.0–46.0)
Hemoglobin: 8.5 g/dL — ABNORMAL LOW (ref 12.0–15.0)
MCH: 33.2 pg (ref 26.0–34.0)
MCHC: 32.6 g/dL (ref 30.0–36.0)
MCV: 102 fL — ABNORMAL HIGH (ref 80.0–100.0)
Platelets: 94 10*3/uL — ABNORMAL LOW (ref 150–400)
RBC: 2.56 MIL/uL — ABNORMAL LOW (ref 3.87–5.11)
RDW: 13.7 % (ref 11.5–15.5)
WBC: 15.5 10*3/uL — ABNORMAL HIGH (ref 4.0–10.5)
nRBC: 0 % (ref 0.0–0.2)

## 2021-05-23 LAB — COOXEMETRY PANEL
Carboxyhemoglobin: 1.1 % (ref 0.5–1.5)
Methemoglobin: 0.6 % (ref 0.0–1.5)
O2 Saturation: 62.6 %
Total hemoglobin: 8.6 g/dL — ABNORMAL LOW (ref 12.0–16.0)

## 2021-05-23 LAB — MAGNESIUM: Magnesium: 2.3 mg/dL (ref 1.7–2.4)

## 2021-05-23 LAB — PROTIME-INR
INR: 1.9 — ABNORMAL HIGH (ref 0.8–1.2)
Prothrombin Time: 21.6 seconds — ABNORMAL HIGH (ref 11.4–15.2)

## 2021-05-23 MED ORDER — VITAMIN D 25 MCG (1000 UNIT) PO TABS
1000.0000 [IU] | ORAL_TABLET | Freq: Every day | ORAL | Status: DC
  Administered 2021-05-23 – 2021-05-25 (×3): 1000 [IU] via ORAL
  Filled 2021-05-23 (×3): qty 1

## 2021-05-23 MED ORDER — METOPROLOL TARTRATE 12.5 MG HALF TABLET
12.5000 mg | ORAL_TABLET | Freq: Two times a day (BID) | ORAL | Status: DC
  Administered 2021-05-23 – 2021-05-26 (×7): 12.5 mg via ORAL
  Filled 2021-05-23 (×7): qty 1

## 2021-05-23 MED ORDER — FE FUMARATE-B12-VIT C-FA-IFC PO CAPS
1.0000 | ORAL_CAPSULE | Freq: Every day | ORAL | Status: DC
  Administered 2021-05-24 – 2021-05-26 (×3): 1 via ORAL
  Filled 2021-05-23 (×3): qty 1

## 2021-05-23 MED ORDER — WARFARIN SODIUM 1 MG PO TABS
1.0000 mg | ORAL_TABLET | Freq: Every day | ORAL | Status: DC
  Administered 2021-05-23 – 2021-05-25 (×3): 1 mg via ORAL
  Filled 2021-05-23 (×3): qty 1

## 2021-05-23 MED ORDER — FUROSEMIDE 40 MG PO TABS: 40.0000 mg | ORAL_TABLET | Freq: Every day | ORAL | Status: DC

## 2021-05-23 MED ORDER — CALCIUM CARBONATE 1250 (500 CA) MG PO TABS
1.0000 | ORAL_TABLET | Freq: Every day | ORAL | Status: DC
  Administered 2021-05-23 – 2021-05-25 (×3): 500 mg via ORAL
  Filled 2021-05-23 (×4): qty 1

## 2021-05-23 MED ORDER — FUROSEMIDE 10 MG/ML IJ SOLN
40.0000 mg | Freq: Two times a day (BID) | INTRAMUSCULAR | Status: AC
  Administered 2021-05-23 – 2021-05-25 (×6): 40 mg via INTRAVENOUS
  Filled 2021-05-23 (×6): qty 4

## 2021-05-23 MED ORDER — POTASSIUM CHLORIDE 10 MEQ/50ML IV SOLN
10.0000 meq | INTRAVENOUS | Status: AC
  Administered 2021-05-23 (×2): 10 meq via INTRAVENOUS
  Filled 2021-05-23 (×2): qty 50

## 2021-05-23 MED ORDER — LOSARTAN POTASSIUM 25 MG PO TABS
25.0000 mg | ORAL_TABLET | Freq: Every day | ORAL | Status: DC
  Administered 2021-05-23 – 2021-05-26 (×3): 25 mg via ORAL
  Filled 2021-05-23 (×4): qty 1

## 2021-05-23 MED ORDER — VITAMIN B-12 1000 MCG PO TABS: 1000.0000 ug | ORAL_TABLET | Freq: Every day | ORAL | Status: DC

## 2021-05-23 MED ORDER — ~~LOC~~ CARDIAC SURGERY, PATIENT & FAMILY EDUCATION: Freq: Once | Status: DC

## 2021-05-23 MED ORDER — POTASSIUM CHLORIDE CRYS ER 20 MEQ PO TBCR
20.0000 meq | EXTENDED_RELEASE_TABLET | Freq: Every day | ORAL | Status: DC
  Administered 2021-05-24: 20 meq via ORAL
  Filled 2021-05-23: qty 1

## 2021-05-23 NOTE — Progress Notes (Signed)
Pt arrived from [facility].  A&Ox4.  CHG bath completed.  Telebox [id number] applied. CCMD notified.

## 2021-05-23 NOTE — Progress Notes (Addendum)
TCTS DAILY ICU PROGRESS NOTE                   [place]            [name] [address]          [phone]   2 Days Post-Op Procedure(s) (LRB): MINIMALLY INVASIVE MITRAL VALVE REPAIR (MVR) USING MEDTRONIC SIMUFORM 30MM RING CLIPPING OF ATRIAL APPENDAGE USING ATRICURE 40MM PRO2 CLIP (N/A) TRANSESOPHAGEAL ECHOCARDIOGRAM (TEE) (N/A)  Total Length of Stay:  LOS: 2 days   Subjective: Awake and alert, no complaints.   Walked in the hall this morning. Currently on RA.    Objective: Vital signs in last 24 hours: Temp:  [97.7 F (36.5 C)-99.5 F (37.5 C)] 97.7 F (36.5 C) (06/30 0400) Pulse Rate:  [79-104] 90 (06/30 0700) Resp:  [11-31] 12 (06/30 0700) BP: (90-116)/(45-69) 101/63 (06/30 0700) SpO2:  [92 %-98 %] 94 % (06/30 0700) Arterial Line BP: (145-147)/(48-50) 147/50 (06/29 0900) Weight:  [66.8 kg] 66.8 kg (06/30 0500)  Filed Weights   05/21/21 0559 05/22/21 0500 05/23/21 0500  Weight: 59.4 kg 66.9 kg 66.8 kg    Weight change: -0.1 kg   Hemodynamic parameters for last 24 hours: PAP: (30-36)/(10-12) 30/10  Intake/Output from previous day: 06/29 0701 - 06/30 0700 In: 492.8 [I.V.:106; IV Piggyback:386.8] Out: 2495 [Urine:1900; Chest Tube:595]  Intake/Output this shift: Total I/O In: 240 [P.O.:240] Out: -   Current Meds: Scheduled Meds:  acetaminophen  1,000 mg Oral Q6H   aspirin EC  81 mg Oral Daily   [START ON 05/24/2021] atorvastatin  20 mg Oral QHS   bisacodyl  10 mg Oral Daily   Or   bisacodyl  10 mg Rectal Daily   calcium carbonate  1 tablet Oral QHS   Chlorhexidine Gluconate Cloth  6 each Topical Q0600   cholecalciferol  1,000 Units Oral QHS   [facility], Patient & Family Education   Does not apply Once   docusate sodium  200 mg Oral Daily   [START ON 05/24/2021] ferrous [id number] C-folic acid  1 capsule Oral Q breakfast   furosemide  40 mg Intravenous BID   [START ON 05/25/2021] furosemide  40 mg Oral Daily    gabapentin  100 mg Oral QHS   lidocaine  2 patch Transdermal Q24H   losartan  25 mg Oral Daily   pantoprazole  40 mg Oral Daily   [START ON 05/24/2021] potassium chloride  20 mEq Oral Q breakfast   sodium chloride flush  3 mL Intravenous Q12H   warfarin  1 mg Oral q1600   Warfarin - Physician Dosing Inpatient   Does not apply q1600   Continuous Infusions:  sodium chloride 250 mL (05/22/21 0505)    ceFAZolin (ANCEF) IV 2 g (05/23/21 0518)   lactated ringers     lactated ringers     potassium chloride 50 mL/hr at 05/23/21 0700   promethazine (PHENERGAN) injection (IM or IVPB) 200 mL/hr at 05/23/21 0400   PRN Meds:.metoprolol tartrate, morphine injection, ondansetron (ZOFRAN) IV, oxyCODONE, promethazine (PHENERGAN) injection (IM or IVPB), sodium chloride flush, traMADol  General appearance: alert, cooperative, and no distress Neurologic: intact Heart: Regular rate and rhythm.  Pacer is off, SR ion 80's.  No murmur. Lungs: Breath sounds are clear, mildly diminished both bases.  Chest tube output is serosanguineous, approximately 600 mL past 24 hours Abdomen: Soft and nontender Extremities: All warm and well-perfused.  She has some edema in her hands,  minimal edema in the lower extremities.   Wound: The right chest incision is covered with a dry Aquacel dressing.  Lab Results: CBC: Recent Labs    05/22/21 1739 05/23/21 0428  WBC 15.5* 15.5*  HGB 8.7* 8.5*  HCT 27.1* 26.1*  PLT 89* 94*    BMET:  Recent Labs    05/22/21 1739 05/23/21 0428  NA 136 132*  K 3.8 4.0  CL 106 102  CO2 21* 23  GLUCOSE 172* 104*  BUN 10 13  CREATININE 0.91 0.76  CALCIUM 7.0* 7.2*     CMET: Lab Results  Component Value Date   WBC 15.5 (H) 05/23/2021   HGB 8.5 (L) 05/23/2021   HCT 26.1 (L) 05/23/2021   PLT 94 (L) 05/23/2021   GLUCOSE 104 (H) 05/23/2021   CHOL 176 02/19/2021   TRIG 153.0 (H) 02/19/2021   HDL 49.50 02/19/2021   LDLDIRECT 75.0 11/02/2019   LDLCALC 96 02/19/2021   ALT  25 05/17/2021   AST 21 05/17/2021   NA 132 (L) 05/23/2021   K 4.0 05/23/2021   CL 102 05/23/2021   CREATININE 0.76 05/23/2021   BUN 13 05/23/2021   CO2 23 05/23/2021   TSH 0.92 02/19/2021   INR 1.9 (H) 05/23/2021   HGBA1C 5.6 02/19/2021      PT/INR:  Recent Labs    05/23/21 0428  LABPROT 21.6*  INR 1.9*   Radiology: [facility] Chest Port 1 View  Result Date: 05/23/2021 CLINICAL DATA:  Chest tube.  Open-heart surgery. EXAM: PORTABLE CHEST 1 VIEW COMPARISON:  05/22/2021. FINDINGS: Left IJ sheath and 2 right chest tubes in stable position. Tiny right apical pneumothorax again noted. Prior cardiac valve replacement. Left atrial appendage clip in stable position. Stable cardiomegaly. No pulmonary venous congestion. Low lung volumes with left base atelectasis/infiltrate small left pleural effusion. Interim improved aeration in the right lung base. IMPRESSION: 1. Left IJ sheath and 2 right chest tubes in stable position. Stable tiny right apical pneumothorax. 2.  Prior cardiac valve replacement.  Stable cardiomegaly. 3. Low lung volumes. Left base atelectasis/infiltrate small left pleural effusion. Interim improved aeration in the right lung base. 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  She is hemodynamically stable.  She is not requiring any vasopressor support.    Begin low-dose Coumadin anticoagulation.    Advance diet and activity, leave the chest tubes for drainage.   -Mild expected acute blood loss anemia and thrombocytopenia. Trends are stable.  Will monitor.  -Endo-history of prediabetes.  Glucose has been well controlled . Continue to monitor.   -Volume  excess-weight is about 7 kg above preop.  Diurese with IV Lasix today.   -DVT prophylaxis- SCD's, ambulate.   Antony Odea, PA-C [phone] 05/23/2021 7:35 AM   I have seen and examined the patient and agree with the assessment and plan as outlined.  Doing very well.  Restart Cozaar at reduced dose and start low dose metoprolol.  Mobilize.  Diuresis. Decrease Coumadin dose.  Transfer 4E  Rexene Alberts, MD 05/23/2021 8:01 AM

## 2021-05-24 LAB — POCT I-STAT 7, (LYTES, BLD GAS, ICA,H+H)
Acid-base deficit: 3 mmol/L — ABNORMAL HIGH (ref 0.0–2.0)
Bicarbonate: 24 mmol/L (ref 20.0–28.0)
Calcium, Ion: 1.06 mmol/L — ABNORMAL LOW (ref 1.15–1.40)
HCT: 33 % — ABNORMAL LOW (ref 36.0–46.0)
Hemoglobin: 11.2 g/dL — ABNORMAL LOW (ref 12.0–15.0)
O2 Saturation: 98 %
Patient temperature: 35.2
Potassium: 3.7 mmol/L (ref 3.5–5.1)
Sodium: 143 mmol/L (ref 135–145)
TCO2: 25 mmol/L (ref 22–32)
pCO2 arterial: 46.3 mmHg (ref 32.0–48.0)
pH, Arterial: 7.313 — ABNORMAL LOW (ref 7.350–7.450)
pO2, Arterial: 111 mmHg — ABNORMAL HIGH (ref 83.0–108.0)

## 2021-05-24 LAB — BASIC METABOLIC PANEL
Anion gap: 7 (ref 5–15)
BUN: 13 mg/dL (ref 8–23)
CO2: 24 mmol/L (ref 22–32)
Calcium: 7.9 mg/dL — ABNORMAL LOW (ref 8.9–10.3)
Chloride: 104 mmol/L (ref 98–111)
Creatinine, Ser: 0.75 mg/dL (ref 0.44–1.00)
GFR, Estimated: >60 mL/min (ref >60)
Glucose, Bld: 127 mg/dL — ABNORMAL HIGH (ref 70–99)
Potassium: 4.1 mmol/L (ref 3.5–5.1)
Sodium: 135 mmol/L (ref 135–145)

## 2021-05-24 LAB — CBC
HCT: 25.8 % — ABNORMAL LOW (ref 36.0–46.0)
Hemoglobin: 8.5 g/dL — ABNORMAL LOW (ref 12.0–15.0)
MCH: 32.8 pg (ref 26.0–34.0)
MCHC: 32.9 g/dL (ref 30.0–36.0)
MCV: 99.6 fL (ref 80.0–100.0)
Platelets: 96 10*3/uL — ABNORMAL LOW (ref 150–400)
RBC: 2.59 MIL/uL — ABNORMAL LOW (ref 3.87–5.11)
RDW: 13.6 % (ref 11.5–15.5)
WBC: 12.2 10*3/uL — ABNORMAL HIGH (ref 4.0–10.5)
nRBC: 0 % (ref 0.0–0.2)

## 2021-05-24 LAB — PROTIME-INR
INR: 2.2 — ABNORMAL HIGH (ref 0.8–1.2)
Prothrombin Time: 24.4 seconds — ABNORMAL HIGH (ref 11.4–15.2)

## 2021-05-24 MED ORDER — FUROSEMIDE 40 MG PO TABS
40.0000 mg | ORAL_TABLET | Freq: Every day | ORAL | Status: DC
  Administered 2021-05-26: 40 mg via ORAL
  Filled 2021-05-24: qty 1

## 2021-05-24 MED ORDER — AMIODARONE HCL IN DEXTROSE 360-4.14 MG/200ML-% IV SOLN
60.0000 mg/h | INTRAVENOUS | Status: AC
  Administered 2021-05-24 (×2): 60 mg/h via INTRAVENOUS
  Filled 2021-05-24: qty 400

## 2021-05-24 MED ORDER — AMIODARONE HCL IN DEXTROSE 360-4.14 MG/200ML-% IV SOLN
30.0000 mg/h | INTRAVENOUS | Status: DC
  Administered 2021-05-24: 30 mg/h via INTRAVENOUS
  Filled 2021-05-24 (×2): qty 200

## 2021-05-24 MED ORDER — AMIODARONE LOAD VIA INFUSION
150.0000 mg | Freq: Once | INTRAVENOUS | Status: AC
  Administered 2021-05-24: 150 mg via INTRAVENOUS
  Filled 2021-05-24: qty 83.34

## 2021-05-24 MED FILL — Sodium Chloride IV Soln 0.9%: INTRAVENOUS | Qty: 2000 | Status: AC

## 2021-05-24 MED FILL — Heparin Sodium (Porcine) Inj 1000 Unit/ML: INTRAMUSCULAR | Qty: 10 | Status: AC

## 2021-05-24 MED FILL — Electrolyte-R (PH 7.4) Solution: INTRAVENOUS | Qty: 5000 | Status: AC

## 2021-05-24 MED FILL — Sodium Bicarbonate IV Soln 8.4%: INTRAVENOUS | Qty: 50 | Status: AC

## 2021-05-24 MED FILL — Albumin, Human Inj 5%: INTRAVENOUS | Qty: 250 | Status: AC

## 2021-05-24 NOTE — Care Management Important Message (Signed)
Important Message  Patient Details  Name: [name] MRN: [id number] Date of Birth: [birth year]   Medicare Important Message Given:  Yes     Orbie Pyo 05/24/2021, 3:25 PM

## 2021-05-24 NOTE — Progress Notes (Addendum)
TCTS DAILY ICU PROGRESS NOTE                   [address]            [place] [address]          [phone]   3 Days Post-Op Procedure(s) (LRB): MINIMALLY INVASIVE MITRAL VALVE REPAIR (MVR) USING MEDTRONIC SIMUFORM 30MM RING CLIPPING OF ATRIAL APPENDAGE USING ATRICURE 40MM PRO2 CLIP (N/A) TRANSESOPHAGEAL ECHOCARDIOGRAM (TEE) (N/A)  Total Length of Stay:  LOS: 3 days   Subjective: Awake and alert, no complaints.  Remains on RA.  Developed atrial fibrillation this morning.  Ventricular rate is currently 140.  Systolic blood pressure 824'M.  Objective: Vital signs in last 24 hours: Temp:  [97.6 F (36.4 C)-99.2 F (37.3 C)] 98.9 F (37.2 C) (07/01 0727) Pulse Rate:  [67-122] 122 (07/01 0727) Cardiac Rhythm: Normal sinus rhythm (07/01 0100) Resp:  [15-24] 20 (07/01 0727) BP: (93-131)/(57-74) 98/66 (07/01 0727) SpO2:  [92 %-96 %] 95 % (07/01 0727) Weight:  [77.3 kg] 77.3 kg (07/01 0518)  Filed Weights   05/22/21 0500 05/23/21 0500 05/24/21 0518  Weight: 66.9 kg 66.8 kg 77.3 kg    Weight change: 10.5 kg       Intake/Output from previous day: 06/30 0701 - 07/01 0700 In: 643.6 [P.O.:480; I.V.:0.4; IV Piggyback:163.2] Out: 1440 [Urine:1350; Chest Tube:90]  Intake/Output this shift: No intake/output data recorded.  Current Meds: Scheduled Meds:  acetaminophen  1,000 mg Oral Q6H   amiodarone  150 mg Intravenous Once   aspirin EC  81 mg Oral Daily   atorvastatin  20 mg Oral QHS   bisacodyl  10 mg Oral Daily   Or   bisacodyl  10 mg Rectal Daily   calcium carbonate  1 tablet Oral QHS   Chlorhexidine Gluconate Cloth  6 each Topical Q0600   cholecalciferol  1,000 Units Oral QHS   docusate sodium  200 mg Oral Daily   ferrous [id number] C-folic acid  1 capsule Oral Q breakfast   furosemide  40 mg Intravenous BID   [START ON 05/26/2021] furosemide  40 mg Oral Daily   gabapentin  100 mg Oral QHS   lidocaine  2 patch Transdermal Q24H    losartan  25 mg Oral Daily   metoprolol tartrate  12.5 mg Oral BID   pantoprazole  40 mg Oral Daily   potassium chloride  20 mEq Oral Q breakfast   sodium chloride flush  3 mL Intravenous Q12H   warfarin  1 mg Oral q1600   Warfarin - Physician Dosing Inpatient   Does not apply q1600   Continuous Infusions:  amiodarone     Followed by   amiodarone     promethazine (PHENERGAN) injection (IM or IVPB) 200 mL/hr at 05/23/21 1100   PRN Meds:.metoprolol tartrate, morphine injection, ondansetron (ZOFRAN) IV, oxyCODONE, promethazine (PHENERGAN) injection (IM or IVPB), sodium chloride flush, traMADol  General appearance: alert, cooperative, and no distress Neurologic: intact Heart: New onset atrial fibrillation  Lungs: Breath sounds are clear, mildly diminished both bases.  Chest tube has tapered off. Abdomen: Soft and nontender Extremities: All warm and well-perfused.  Mild edema is resolving.  Wound: The right chest incision is covered with a dry Aquacel dressing.  Lab Results: CBC: Recent Labs    05/23/21 0428 05/24/21 0113  WBC 15.5* 12.2*  HGB 8.5* 8.5*  HCT 26.1* 25.8*  PLT 94* 96*    BMET:  Recent Labs  05/23/21 0428 05/24/21 0113  NA 132* 135  K 4.0 4.1  CL 102 104  CO2 23 24  GLUCOSE 104* 127*  BUN 13 13  CREATININE 0.76 0.75  CALCIUM 7.2* 7.9*     CMET: Lab Results  Component Value Date   WBC 12.2 (H) 05/24/2021   HGB 8.5 (L) 05/24/2021   HCT 25.8 (L) 05/24/2021   PLT 96 (L) 05/24/2021   GLUCOSE 127 (H) 05/24/2021   CHOL 176 02/19/2021   TRIG 153.0 (H) 02/19/2021   HDL 49.50 02/19/2021   LDLDIRECT 75.0 11/02/2019   LDLCALC 96 02/19/2021   ALT 25 05/17/2021   AST 21 05/17/2021   NA 135 05/24/2021   K 4.1 05/24/2021   CL 104 05/24/2021   CREATININE 0.75 05/24/2021   BUN 13 05/24/2021   CO2 24 05/24/2021   TSH 0.92 02/19/2021   INR 2.2 (H) 05/24/2021   HGBA1C 5.6 02/19/2021      PT/INR:  Recent Labs    05/24/21 0113  LABPROT 24.4*   INR 2.2*    Radiology: No results found.   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  She is hemodynamically stable. INR is 2.1.  Continue low-dose Coumadin anticoagulation.    Remove chest tubes.   Removed pacing wires after initial IV amiodarone bolus.  -Atrial fibrillation with RVR- new-onset. K+ and Mg++ OK. Begin amiodarone load IV.   -Mild expected acute blood loss anemia and thrombocytopenia. Trends are stable.  Will monitor.  -Volume excess-weight is not accurate (11kg increase from yesterday).  Will continue oral Lasix.   -DVT prophylaxis- SCD's, ambulate.   Antony Odea, PA-C [phone] 05/24/2021 7:37 AM

## 2021-05-24 NOTE — Progress Notes (Signed)
CT x2 removed. Pt tolerated well. Call light in reach.  Clyde Canterbury, RN

## 2021-05-24 NOTE — Care Management Important Message (Signed)
Important Message  Patient Details  Name: [name] [name] MRN: [id number] Date of Birth: 20-Mar-1946   Medicare Important Message Given:  Yes     Orbie Pyo 05/24/2021, 3:21 PM

## 2021-05-24 NOTE — Progress Notes (Signed)
CARDIAC REHAB PHASE I   PRE:  Rate/Rhythm: 89 afib    BP: sitting 120/61    SaO2: 95 RA  MODE:  Ambulation: 280 ft   POST:  Rate/Rhythm: 116 afib    BP: sitting 116/77     SaO2: 96 RA  Pt feeling much better after CT out. Able to walk with RW but did c/o dizziness toward end of walk. To recliner. Discussed CRPII and will refer to [place], ACSM 05/24/2021 3:24 PM

## 2021-05-24 NOTE — Progress Notes (Signed)
EPW removed per protocol. Tips intact. VSS. Pt educated on 1hour bedrest.  Clyde Canterbury, RN

## 2021-05-24 NOTE — Progress Notes (Signed)
Pt noted to be in a.fib RVR with rates in 140's. EKG obtained. BP 125/74 (88). PA in room to assess.  Clyde Canterbury, RN

## 2021-05-24 NOTE — Discharge Summary (Signed)
[address]       [place] [address]             [phone]    Physician Discharge Summary  Patient ID: [name] [name] MRN: [id number] DOB/AGE: 1945-12-09 75 y.o.  Admit date: 05/21/2021 Discharge date: 05/26/2021  Admission Diagnoses:  Patient Active Problem List   Diagnosis Date Noted   Mitral regurgitation 02/28/2021   Heart failure ([place]) 02/21/2021   Murmur 02/21/2021   Abnormal CT scan of lung 02/06/2021   Chest pain 02/05/2021   Cardiomegaly 02/05/2021   Left lower quadrant abdominal pain 05/01/2020   Acute post-traumatic headache, not intractable 11/15/2019   Prediabetes 05/02/2019   Carbuncle of groin 02/25/2019   Skin abnormalities 05/07/2018   Bilateral shoulder pain 12/11/2017   Gastritis 12/11/2017   Trigger finger, right little finger 09/11/2017   Numbness and tingling in left hand 07/20/2017   Dupuytren's contracture of hand 07/20/2017   CTS (carpal tunnel syndrome) 07/17/2017   Bilateral carpal tunnel syndrome 07/17/2017   Arthralgia 06/30/2017   Leg cramps 06/10/2017   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   Chronic midline low back pain without sciatica 09/15/2016   Osteopenia 07/20/2016   Bilateral finger numbness 06/03/2016   Joint pain 06/03/2016   Hyperlipidemia 02/09/2015   Bilateral primary osteoarthritis of knee 10/03/2013   Lateral meniscal tear 10/03/2013   Patellofemoral pain syndrome 10/03/2013   Varicose vein of leg    Allergic rhinitis    IBS (irritable bowel syndrome) 04/07/2011   Epigastric pain 12/26/2010   Diarrhea 12/25/2010   Migraine headache 09/27/2010   Essential hypertension 09/27/2010   GERD 09/27/2010   RENAL CALCULUS, HX OF 09/27/2010   Discharge Diagnoses:  Patient Active Problem List   Diagnosis Date Noted   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   Mitral regurgitation 02/28/2021   Heart failure ([place]) 02/21/2021   Murmur 02/21/2021   Abnormal CT scan of lung  02/06/2021   Chest pain 02/05/2021   Cardiomegaly 02/05/2021   Left lower quadrant abdominal pain 05/01/2020   Acute post-traumatic headache, not intractable 11/15/2019   Prediabetes 05/02/2019   Carbuncle of groin 02/25/2019   Skin abnormalities 05/07/2018   Bilateral shoulder pain 12/11/2017   Gastritis 12/11/2017   Trigger finger, right little finger 09/11/2017   Numbness and tingling in left hand 07/20/2017   Dupuytren's contracture of hand 07/20/2017   CTS (carpal tunnel syndrome) 07/17/2017   Bilateral carpal tunnel syndrome 07/17/2017   Arthralgia 06/30/2017   Leg cramps 06/10/2017   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   Chronic midline low back pain without sciatica 09/15/2016   Osteopenia 07/20/2016   Bilateral finger numbness 06/03/2016   Joint pain 06/03/2016   Hyperlipidemia 02/09/2015   Bilateral primary osteoarthritis of knee 10/03/2013   Lateral meniscal tear 10/03/2013   Patellofemoral pain syndrome 10/03/2013   Varicose vein of leg    Allergic rhinitis    IBS (irritable bowel syndrome) 04/07/2011   Epigastric pain 12/26/2010   Diarrhea 12/25/2010   Migraine headache 09/27/2010   Essential hypertension 09/27/2010   GERD 09/27/2010   RENAL CALCULUS, HX OF 09/27/2010   Discharged Condition: good  History of Present Illness  Patient is 75 year old female with history of hypertension, hyperlipidemia, degenerative arthritis, PVCs, migraine headaches, irritable bowel syndrome, GE reflux disease, and hiatal hernia who has been referred for surgical consultation to discuss treatment options for recently discovered mitral valve prolapse with severe mitral regurgitation.  Patient denies any known history of heart murmur until recently.  She has been evaluated in the past for symptoms of atypical chest pain and palpitations.  Over the past 2 years she has developed progressive symptoms of exertional shortness of breath and fatigue.  She was recently noted  to have a systolic murmur on physical exam by her son who is a primary care physician.  She was referred to Dr. Gwenlyn Found for cardiology consultation and transthoracic echocardiogram performed February 27, 2021 revealed mitral valve prolapse with at least moderate and possibly severe mitral regurgitation.  Left ventricular systolic function was reported to be normal with ejection fraction estimated 55 to 60%.  There was moderate left atrial enlargement.  The patient subsequently underwent transesophageal echocardiogram March 14, 2021 which confirmed the presence of mitral valve prolapse and severe mitral regurgitation.  There was systolic flow reversal in the pulmonary veins.  There was moderate left atrial enlargement and normal left ventricular function with ejection fraction estimated 60 to 65%.  Diagnostic cardiac catheterization was performed by Dr. Gwenlyn Found and revealed normal coronary artery anatomy with no significant coronary artery disease.  Pulmonary artery pressures were mild to moderately elevated.  Cardiothoracic surgical consultation was requested.   Patient is married and lives locally in [place] with her husband.  She is originally from [place] but has lived in the [place] for more than 30 years.  She is a retired Radio producer.  She remained reasonably active physically although she admits that she does not walk as much as she used to in the past because of degenerative arthritis in both knees.  More recently she has been limited primarily by shortness of breath.  Symptoms began approximately 2 years ago and have progressed.  She now intermittently gets short of breath with low-level activity but she denies resting shortness of breath.  She denies any history of orthopnea but she has had occasional episodes of PND.  She reports occasional tachypalpitations without dizziness or syncope.  She has not had any exertional chest pain or chest tightness.  She denies any history of lower extremity edema.   Shortness of breath is exacerbated by lying on her left side. Mitral valve repair was offered to the patient and discussed with her and her family in detail by Dr. Roxy Manns.  She like to proceed with surgery.   Hospital Course:  Ms. Schrimpf was admitted to the hospital for elective surgery on 05/21/2021.  She was prepared and taken to the OR where minimally invasive mitral valve repair was accomplished along with clipping of the left atrial appendage.  Please see the operative note below for details.  Following the procedure, she separated from cardiopulmonary bypass without any difficulty and was extubated in the operating room.  She was transferred to the surgical ICU in stable condition.  Vital signs and hemodynamics remained stable.  The monitoring lines were removed on the first postoperative day.  She was mobilized and made satisfactory progress.  On postop day 1, she was in a sinus rhythm with heart rate in the mid 60s.  Atrial pacing was utilized on post-op day 1 for improved hemodynamics.  By the following day, she was in [place] in the 80's.  Anticoagulation with coumadin was initiated and daily INR's were monitored. She was started back on her Cozaar at reduced dose on the second post-op day along with low-dose metoprolol. She was transferred to 4E Progressive Care. Activity was advanced and she made excellent progress. She developed atrial fibrillation on post-op  day 3 with v-rates in the 140's. IV amiodarone loading was initiated. The chest tubes and pacing wires were removed on post-op day 3. The patient converted to NSR.  Her Amiodarone was transitioned to an oral regimen.  Her magnesium level was low and she was supplemented accordingly.  Her potassium was at 3.5 and her supplementation was also increased.  She remains on coumadin at 1 mg daily.  Her most recent INR is 2.3.  She is ambulating without difficulty.  Her surgical incisions are healing without evidence of infection.  She is medically stable  for discharge home today.  Significant Diagnostic Studies: cardiac graphics: Echocardiogram:    IMPRESSIONS     1. Left ventricular ejection fraction, by estimation, is 60 to 65%. The  left ventricle has normal function. The left ventricle has no regional  wall motion abnormalities.   2. Right ventricular systolic function is normal. The right ventricular  size is normal.   3. Left atrial size was moderately dilated. No left atrial/left atrial  appendage thrombus was detected. The LAA emptying velocity was 69 cm/s.   4. There is mild prolapse of the anterior mitral valve leaflet and  partially flail posterior leaflet with severe MR eccentrically directed  anteriorly and wraps around the LA into the left upper pulmonary vein.  There is systolic flow reversal in the  pulmonary vein. . The mitral valve is normal in structure. Severe mitral  valve regurgitation. No evidence of mitral stenosis.   5. The aortic valve is normal in structure. Aortic valve regurgitation is  not visualized. No aortic stenosis is present.   Treatments: surgery:   CARDIOTHORACIC SURGERY OPERATIVE NOTE   Date of Procedure:                05/21/2021   Preoperative Diagnosis:      Severe Mitral Regurgitation   Postoperative Diagnosis:    Same   Procedure:        Minimally-Invasive Mitral Valve Repair             Complex valvuloplasty including PTFE neochord placement x6             Medtronic Simuform Ring Annuloplasty (size 38mm, model # [id number], serial # [id number])             Clipping of left atrial appendage (Atricure left atrial clip Pro240, size 40 mm)                Surgeon:        Valentina Gu. Roxy Manns, MD   Assistant:       Enid Cutter, PA-C     Discharge Exam: Blood pressure 111/61, pulse 68, temperature 99 F (37.2 C), temperature source Oral, resp. rate 17, height 5\' 2"  (1.575 m), weight 60.5 kg, SpO2 95 %.  General appearance: alert, cooperative, and no distress Heart: regular rate and  rhythm Lungs: clear to auscultation bilaterally Abdomen: soft, non-tender; bowel sounds normal; no masses,  no organomegaly Extremities: edema minimal Wound: clean and dry, ecchymosis present   Discharge Medications:  The patient has been discharged on:   1.Beta Blocker:  Yes [ X  ]                              No   [   ]  If No, reason:  2.Ace Inhibitor/ARB: Yes [  X ]                                     No  [    ]                                     If No, reason:  3.Statin:   Yes [ x  ]                  No  [   ]                  If No, reason:  4.Ecasa:  Yes  [  X ]                  No   [   ]                  If No, reason:    Discharge Instructions     Amb Referral to Cardiac Rehabilitation   Complete by: As directed    Diagnosis: Valve Repair   Valve: Mitral   After initial evaluation and assessments completed: Virtual Based Care may be provided alone or in conjunction with Phase 2 Cardiac Rehab based on patient barriers.: Yes      Allergies as of 05/26/2021   No Known Allergies      Medication List     STOP taking these medications    metoprolol succinate 50 MG 24 hr tablet Commonly known as: TOPROL-XL       TAKE these medications    acetaminophen 500 MG tablet Commonly known as: TYLENOL Take 500 mg by mouth daily.   amiodarone 200 MG tablet Commonly known as: PACERONE Take 1 tablet (200 mg total) by mouth 2 (two) times daily after a meal. X 7 days, then decrease to 200 mg ( 1 tab) daily   aspirin 81 MG EC tablet Take 1 tablet (81 mg total) by mouth daily. Swallow whole.   atorvastatin 40 MG tablet Commonly known as: LIPITOR Take 1/2 (one-half) tablet by mouth once daily What changed:  how much to take how to take this when to take this additional instructions   butalbital-acetaminophen-caffeine 50-325-40 MG tablet Commonly known as: FIORICET TAKE 1 TABLET BY MOUTH EVERY 4 HOURS AS NEEDED FOR HEADACHE    calcium carbonate 1250 (500 Ca) MG tablet Commonly known as: OS-CAL - dosed in mg of elemental calcium Take 1 tablet by mouth at bedtime.   cyanocobalamin 1000 MCG tablet Take 1,000 mcg by mouth daily.   furosemide 20 MG tablet Commonly known as: LASIX Take 20 mg by mouth daily.   gabapentin 100 MG capsule Commonly known as: NEURONTIN Take 1 capsule (100 mg total) by mouth at bedtime. What changed:  when to take this reasons to take this   IBgard 90 MG Cpcr Generic drug: Peppermint Oil Take as directed What changed:  how much to take how to take this when to take this   losartan 25 MG tablet Commonly known as: COZAAR Take 1 tablet (25 mg total) by mouth daily. What changed:  medication strength how much to take   metoprolol tartrate 25 MG tablet Commonly known as: LOPRESSOR Take 0.5 tablets (12.5 mg total) by mouth 2 (  two) times daily.   oxyCODONE 5 MG immediate release tablet Commonly known as: Oxy IR/ROXICODONE Take 1 tablet (5 mg total) by mouth every 4 (four) hours as needed for severe pain.   potassium chloride SA 20 MEQ tablet Commonly known as: KLOR-CON 1/2 tablet daily What changed:  how much to take how to take this when to take this additional instructions   Vitamin D3 25 MCG (1000 UT) Caps Take 1,000 Units by mouth at bedtime.   warfarin 1 MG tablet Commonly known as: COUMADIN Take 1 tablet (1 mg total) by mouth daily at 4 PM.               Durable Medical Equipment  (From admission, onward)           Start     Ordered   05/26/21 0730  For home use only DME Walker rolling  Once       Question Answer Comment  Walker: With [place] Wheels   Patient needs a walker to treat with the following condition Physical deconditioning      05/26/21 0729            Follow-up Information     [facility] [place]. Go on 05/31/2021.   Specialty: Cardiothoracic Surgery Why: Your appointment for suture  removal is at 11am. Contact information: [place], [place] [phone]        [facility]. Go on 06/17/2021.   Specialty: Cardiothoracic Surgery Why: Your appointment is at 1:30.  Please arrive 30 minutes early for a chest x-ray to be performed by [facility] located on the first floor of the same building. Contact information: [place], [place]        Lorretta Harp, MD. Go on 06/19/2021.   Specialties: Cardiology, Radiology Why: Your appointment is at 9:45am. Contact information: [address] [place] [address] [place] [address] [phone]         [facility]. Go on 07/09/2021.   Specialty: Cardiology Why: Your follow-up ECHO is at 10:20am. Contact information: [place] [phone] [place] [phone]                Signed: Ellwood Handler, PA-C 05/26/2021, 7:49 AM

## 2021-05-25 ENCOUNTER — Inpatient Hospital Stay (HOSPITAL_COMMUNITY): Payer: PPO

## 2021-05-25 LAB — BASIC METABOLIC PANEL
Anion gap: 8 (ref 5–15)
BUN: 13 mg/dL (ref 8–23)
CO2: 27 mmol/L (ref 22–32)
Calcium: 8.5 mg/dL — ABNORMAL LOW (ref 8.9–10.3)
Chloride: 102 mmol/L (ref 98–111)
Creatinine, Ser: 0.88 mg/dL (ref 0.44–1.00)
GFR, Estimated: >60 mL/min (ref >60)
Glucose, Bld: 111 mg/dL — ABNORMAL HIGH (ref 70–99)
Potassium: 3.5 mmol/L (ref 3.5–5.1)
Sodium: 137 mmol/L (ref 135–145)

## 2021-05-25 LAB — MAGNESIUM: Magnesium: 1.9 mg/dL (ref 1.7–2.4)

## 2021-05-25 LAB — PROTIME-INR
INR: 2.3 — ABNORMAL HIGH (ref 0.8–1.2)
Prothrombin Time: 25.2 seconds — ABNORMAL HIGH (ref 11.4–15.2)

## 2021-05-25 MED ORDER — AMIODARONE HCL 200 MG PO TABS
200.0000 mg | ORAL_TABLET | Freq: Two times a day (BID) | ORAL | Status: DC
  Administered 2021-05-25 – 2021-05-26 (×3): 200 mg via ORAL
  Filled 2021-05-25 (×3): qty 1

## 2021-05-25 MED ORDER — POTASSIUM CHLORIDE CRYS ER 20 MEQ PO TBCR: 20.0000 meq | EXTENDED_RELEASE_TABLET | Freq: Every day | ORAL | Status: DC

## 2021-05-25 MED ORDER — POTASSIUM CHLORIDE CRYS ER 20 MEQ PO TBCR
40.0000 meq | EXTENDED_RELEASE_TABLET | Freq: Three times a day (TID) | ORAL | Status: AC
  Administered 2021-05-25 (×3): 40 meq via ORAL
  Filled 2021-05-25 (×3): qty 2

## 2021-05-25 MED ORDER — POTASSIUM CHLORIDE CRYS ER 20 MEQ PO TBCR: 40.0000 meq | EXTENDED_RELEASE_TABLET | Freq: Every day | ORAL | Status: DC

## 2021-05-25 MED ORDER — MAGNESIUM SULFATE 4 GM/100ML IV SOLN
4.0000 g | Freq: Once | INTRAVENOUS | Status: AC
  Administered 2021-05-25: 4 g via INTRAVENOUS
  Filled 2021-05-25: qty 100

## 2021-05-25 MED ORDER — MAGNESIUM SULFATE 2 GM/50ML IV SOLN: 2.0000 g | Freq: Once | INTRAVENOUS | Status: DC

## 2021-05-25 NOTE — Progress Notes (Signed)
Pt converted back to NSR 60's to 70's will continue to monitor

## 2021-05-25 NOTE — Progress Notes (Signed)
CARDIAC REHAB PHASE I   PRE:  Rate/Rhythm: 65 w/ PVC's  BP:  Supine: 116/52  Sitting:   Standing:    SaO2: 97 RA  MODE:  Ambulation: 400 ft   POST:  Rate/Rhythm: 76 w/ PVC's  BP:  Supine: 124/62  Sitting:   Standing:    SaO2: 95 RA Pt tolerated exercise well and AMB 400 ft with standby assist and rolling Shaylon Aden. Pt had no c/o CP or pain. Mild SOB toward the end of the walk. Education given on cardiac surgery booklet, heart healthy diet, home ex Rx and restrictions. Reinforced wound care, IS usage, medication adherence and will refer to CRP2 [place]. All questions were answered. Pt left in bed with call bell in reach. Pt will have her sister stay with her upon d/c.  [id number] Kirby Funk ACSM-EP 05/25/2021 9:40 AM

## 2021-05-25 NOTE — Progress Notes (Signed)
Patient ambulated in hallway with nursing staff and rolling walker. 400 feet. Patient assisted to restroom when back in room. Jenene Kauffmann, Bettina Gavia RN

## 2021-05-25 NOTE — Progress Notes (Signed)
      [address]       [place] [address]             [phone]      4 Days Post-Op Procedure(s) (LRB): MINIMALLY INVASIVE MITRAL VALVE REPAIR (MVR) USING MEDTRONIC SIMUFORM 30MM RING CLIPPING OF ATRIAL APPENDAGE USING ATRICURE 40MM PRO2 CLIP (N/A) TRANSESOPHAGEAL ECHOCARDIOGRAM (TEE) (N/A)  Subjective:  Up in chair.  No complaints.  Looks great and overall doing well.  + ambulation   + BM  Objective: Vital signs in last 24 hours: Temp:  [98.2 F (36.8 C)-99.5 F (37.5 C)] 98.2 F (36.8 C) (07/02 0338) Pulse Rate:  [60-121] 66 (07/02 0338) Cardiac Rhythm: Normal sinus rhythm (07/02 0004) Resp:  [14-20] 16 (07/02 0338) BP: (82-118)/(41-75) 116/59 (07/02 0338) SpO2:  [93 %-97 %] 97 % (07/02 0338) Weight:  [62.4 kg] 62.4 kg (07/02 0338)  Intake/Output from previous day: 07/01 0701 - 07/02 0700 In: 1256.5 [P.O.:720; I.V.:536.5] Out: 220 [Chest Tube:220]   General appearance: alert, cooperative, and no distress Heart: regular rate and rhythm Lungs: clear to auscultation bilaterally Abdomen: soft, non-tender; bowel sounds normal; no masses,  no organomegaly Extremities: edema trace Wound: clean and dry  Lab Results: Recent Labs    05/23/21 0428 05/24/21 0113  WBC 15.5* 12.2*  HGB 8.5* 8.5*  HCT 26.1* 25.8*  PLT 94* 96*   BMET:  Recent Labs    05/24/21 0113 05/25/21 0050  NA 135 137  K 4.1 3.5  CL 104 102  CO2 24 27  GLUCOSE 127* 111*  BUN 13 13  CREATININE 0.75 0.88  CALCIUM 7.9* 8.5*    PT/INR:  Recent Labs    05/25/21 0050  LABPROT 25.2*  INR 2.3*   ABG    Component Value Date/Time   PHART 7.313 (L) 05/21/2021 1418   HCO3 24.0 05/21/2021 1418   TCO2 25 05/21/2021 1418   ACIDBASEDEF 3.0 (H) 05/21/2021 1418   O2SAT 62.6 05/23/2021 0435   CBG (last 3)  Recent Labs    05/23/21 0450 05/23/21 0828 05/23/21 1138  GLUCAP 111* 171* 129*    Assessment/Plan: S/P Procedure(s) (LRB): MINIMALLY INVASIVE MITRAL VALVE  REPAIR (MVR) USING MEDTRONIC SIMUFORM 30MM RING CLIPPING OF ATRIAL APPENDAGE USING ATRICURE 40MM PRO2 CLIP (N/A) TRANSESOPHAGEAL ECHOCARDIOGRAM (TEE) (N/A)  CV- PAF, converted to NSR around  midnight, occasional PVCs- will d/c IV Amiodarone, convert to oral 200 mg BID, continue Lopressor 12.5 mg BID INR 2.3, continue coumadin at 1 mg daily Pulm- no acute issues, chest tube removed yesterday, CXR with trace left pleural effusion/atelectasis, no pneumothorax Renal- creatinine remains WNL, weight is about 6 lbs above baseline, diuretics ordered per Dr. Roxy Manns.. K is at 3.5 and in setting of A. Fib would like closer to 4, will increase supplement Hypomagnesemia- level is at 1.9, will supplement today in setting of recent A. Fib Dispo- patient stable, in NSR, Amiodarone converted to oral regimen 200 mg BID, potassium and Mg supplemented, continue coumadin, patient doing well, should be ready for d/c soon   LOS: 4 days    Ellwood Handler, PA-C 05/25/2021

## 2021-05-26 LAB — MAGNESIUM: Magnesium: 2.2 mg/dL (ref 1.7–2.4)

## 2021-05-26 LAB — BASIC METABOLIC PANEL
Anion gap: 6 (ref 5–15)
BUN: 13 mg/dL (ref 8–23)
CO2: 30 mmol/L (ref 22–32)
Calcium: 8.8 mg/dL — ABNORMAL LOW (ref 8.9–10.3)
Chloride: 102 mmol/L (ref 98–111)
Creatinine, Ser: 0.87 mg/dL (ref 0.44–1.00)
GFR, Estimated: >60 mL/min (ref >60)
Glucose, Bld: 108 mg/dL — ABNORMAL HIGH (ref 70–99)
Potassium: 4.1 mmol/L (ref 3.5–5.1)
Sodium: 138 mmol/L (ref 135–145)

## 2021-05-26 LAB — PROTIME-INR
INR: 2.3 — ABNORMAL HIGH (ref 0.8–1.2)
Prothrombin Time: 24.9 seconds — ABNORMAL HIGH (ref 11.4–15.2)

## 2021-05-26 MED ORDER — OXYCODONE HCL 5 MG PO TABS: 5.0000 mg | ORAL_TABLET | ORAL | 0 refills | Status: DC | PRN

## 2021-05-26 MED ORDER — LOSARTAN POTASSIUM 25 MG PO TABS: 25.0000 mg | ORAL_TABLET | Freq: Every day | ORAL | 3 refills | Status: DC

## 2021-05-26 MED ORDER — METOPROLOL TARTRATE 25 MG PO TABS: 12.5000 mg | ORAL_TABLET | Freq: Two times a day (BID) | ORAL | 3 refills | Status: DC

## 2021-05-26 MED ORDER — WARFARIN SODIUM 1 MG PO TABS: 1.0000 mg | ORAL_TABLET | Freq: Every day | ORAL | 3 refills | Status: DC

## 2021-05-26 MED ORDER — ASPIRIN 81 MG PO TBEC: 81.0000 mg | DELAYED_RELEASE_TABLET | Freq: Every day | ORAL | 11 refills | Status: AC

## 2021-05-26 MED ORDER — AMIODARONE HCL 200 MG PO TABS: 200.0000 mg | ORAL_TABLET | Freq: Two times a day (BID) | ORAL | 1 refills | Status: DC

## 2021-05-26 NOTE — Progress Notes (Signed)
Patient ambulated in hallway with nursing staff. With rolling walker. 450 feet. Assisted back to chair. Magaby Rumberger, Bettina Gavia RN

## 2021-05-26 NOTE — TOC Transition Note (Signed)
Transition of Care (TOC) - CM/SW Discharge Note Marvetta Gibbons RN, BSN Transitions of Care Unit 4E- RN Case Manager See Treatment Team for direct phone #    Patient Details  Name: [name] [name] MRN: [id number] Date of Birth: 03-08-1946  Transition of Care [facility]) CM/SW Contact:  Dawayne Patricia, RN Phone Number: 05/26/2021, 10:13 AM   Clinical Narrative:    Pt stable for transition home today, order placed for DME-RW. CM spoke with pt and pt agreeable to use in house provider for DME need- call made to [employer] for DME referral- RW to be delivered to room prior to discharge.  No further TOC needs noted.    Final next level of care: Home/Self Care Barriers to Discharge: No Barriers Identified   Patient Goals and CMS Choice Patient states their goals for this hospitalization and ongoing recovery are:: return home CMS Medicare.gov Compare Post Acute Care list provided to:: Patient Choice offered to / list presented to : Patient  Discharge Placement                 Home      Discharge Plan and Services   Discharge Planning Services: CM Consult Post Acute Care Choice: Durable Medical Equipment          DME Arranged: Walker rolling DME Agency: [facility] Date DME Agency Contacted: 05/26/21 Time DME Agency Contacted: [phone] Representative spoke with at DME Agency: [place]: NA [place] Agency: NA        Social Determinants of Health ([place]) Interventions     Readmission Risk Interventions Readmission Risk Prevention Plan 05/26/2021  Transportation Screening Complete  PCP or Specialist Appt within 5-7 Days Complete  Home Care Screening Complete  Medication Review (RN CM) Complete  Some recent data might be hidden

## 2021-05-26 NOTE — Progress Notes (Signed)
Discharge instructions reviewed with patient and family. Follow up appointments given to patient, medications sent to personal pharmacy. Medication list given with next dose due. Will discharge home as ordered, IV and tele were removed. Transported to exit via wheel chair and nursing staff. Denissa Cozart, Bettina Gavia RN

## 2021-05-26 NOTE — Progress Notes (Addendum)
      [address]       [place], [address]             [phone]      5 Days Post-Op Procedure(s) (LRB): MINIMALLY INVASIVE MITRAL VALVE REPAIR (MVR) USING MEDTRONIC SIMUFORM 30MM RING CLIPPING OF ATRIAL APPENDAGE USING ATRICURE 40MM PRO2 CLIP (N/A) TRANSESOPHAGEAL ECHOCARDIOGRAM (TEE) (N/A)  Subjective:  Patient looks great.  She continues to have no complaints of pain or shortness of breath.  She ambulated several times in the hallway yesterday around 400 ft.  She would like a rolling walker at discharge.  Her sister and husband are at home to help with her recovery.  + BM  Objective: Vital signs in last 24 hours: Temp:  [98.5 F (36.9 C)-99.2 F (37.3 C)] 99 F (37.2 C) (07/03 0337) Pulse Rate:  [64-76] 68 (07/03 0337) Cardiac Rhythm: Normal sinus rhythm (07/02 1925) Resp:  [17-21] 17 (07/03 0337) BP: (105-135)/(52-68) 111/61 (07/03 0337) SpO2:  [94 %-98 %] 95 % (07/03 0337) Weight:  [60.5 kg] 60.5 kg (07/03 0337)  Intake/Output from previous day: 07/02 0701 - 07/03 0700 In: 775.4 [P.O.:600; I.V.:75.4; IV Piggyback:100] Out: -   General appearance: alert, cooperative, and no distress Heart: regular rate and rhythm Lungs: clear to auscultation bilaterally Abdomen: soft, non-tender; bowel sounds normal; no masses,  no organomegaly Extremities: edema minimal Wound: clean and dry, ecchymosis present  Lab Results: Recent Labs    05/24/21 0113  WBC 12.2*  HGB 8.5*  HCT 25.8*  PLT 96*   BMET:  Recent Labs    05/25/21 0050 05/26/21 0154  NA 137 138  K 3.5 4.1  CL 102 102  CO2 27 30  GLUCOSE 111* 108*  BUN 13 13  CREATININE 0.88 0.87  CALCIUM 8.5* 8.8*    PT/INR:  Recent Labs    05/26/21 0154  LABPROT 24.9*  INR 2.3*   ABG    Component Value Date/Time   PHART 7.313 (L) 05/21/2021 1418   HCO3 24.0 05/21/2021 1418   TCO2 25 05/21/2021 1418   ACIDBASEDEF 3.0 (H) 05/21/2021 1418   O2SAT 62.6 05/23/2021 0435   CBG (last 3)   Recent Labs    05/23/21 0828 05/23/21 1138  GLUCAP 171* 129*    Assessment/Plan: S/P Procedure(s) (LRB): MINIMALLY INVASIVE MITRAL VALVE REPAIR (MVR) USING MEDTRONIC SIMUFORM 30MM RING CLIPPING OF ATRIAL APPENDAGE USING ATRICURE 40MM PRO2 CLIP (N/A) TRANSESOPHAGEAL ECHOCARDIOGRAM (TEE) (N/A)  CV- remains in NSR, has not experienced any further A. Fib- continue Amiodarone, Lopressor 12.5 mg BID INR 2.3, will continue coumadin at 1 mg daily, as she is on Amiodarone with plans for repeat check next week Pulm- not requiring oxygen, continue IS at discharge Renal- hypokalemia improved to 4.1, Mg level up to 2.2, diuretics ordered, she has minimal edema on exam Deconditioning-mild, Rolling walker ordered for home Dispo- patient doing well, she remains in NSR w/o further A. Fib, electrolytes have been corrected, she is therapeutic on coumadin, diuresing well, will d/c home later this afterrnoon   LOS: 5 days    Ellwood Handler, PA-C 05/26/2021

## 2021-05-28 ENCOUNTER — Other Ambulatory Visit: Payer: Self-pay

## 2021-05-28 DIAGNOSIS — I340 Nonrheumatic mitral (valve) insufficiency: Secondary | ICD-10-CM

## 2021-05-29 ENCOUNTER — Ambulatory Visit (INDEPENDENT_AMBULATORY_CARE_PROVIDER_SITE_OTHER): Payer: PPO

## 2021-05-29 ENCOUNTER — Other Ambulatory Visit: Payer: Self-pay

## 2021-05-29 DIAGNOSIS — Z9889 Other specified postprocedural states: Secondary | ICD-10-CM | POA: Diagnosis not present

## 2021-05-29 DIAGNOSIS — Z7901 Long term (current) use of anticoagulants: Secondary | ICD-10-CM

## 2021-05-29 DIAGNOSIS — I340 Nonrheumatic mitral (valve) insufficiency: Secondary | ICD-10-CM | POA: Diagnosis not present

## 2021-05-29 DIAGNOSIS — Z5181 Encounter for therapeutic drug level monitoring: Secondary | ICD-10-CM

## 2021-05-29 HISTORY — DX: Long term (current) use of anticoagulants: Z79.01

## 2021-05-29 LAB — POCT INR: INR: 1.6 — AB (ref 2.0–3.0)

## 2021-05-29 NOTE — Patient Instructions (Signed)
Take 2 tablets tonight only and then continue 1 tablet Daily.  INR in 1 week. [phone]  A full discussion of the nature of anticoagulants has been carried out.  A benefit risk analysis has been presented to the patient, so that they understand the justification for choosing anticoagulation at this time. The need for frequent and regular monitoring, precise dosage adjustment and compliance is stressed.  Side effects of potential bleeding are discussed.  The patient should avoid any OTC items containing aspirin or ibuprofen, and should avoid great swings in general diet.  Avoid alcohol consumption.  Call if any signs of abnormal bleeding.

## 2021-05-30 ENCOUNTER — Telehealth (HOSPITAL_COMMUNITY): Payer: Self-pay

## 2021-05-30 NOTE — Telephone Encounter (Signed)
Pt insurance is active and benefits verified through [employer] adv Co-pay $15, DED 0/0 met, out of pocket $5,150/$809.21 met, co-insurance 0%. no pre-authorization required. 05/30/2021_0 :00pm, REF# [id number]   Will contact patient to see if she is interested in the Cardiac Rehab Program. If interested, patient will need to complete follow up appt. Once completed, patient will be contacted for scheduling upon review by the RN Navigator.

## 2021-05-30 NOTE — Telephone Encounter (Signed)
Attempted to call patient in regards to Cardiac Rehab - LM on VM 

## 2021-05-31 ENCOUNTER — Other Ambulatory Visit: Payer: Self-pay

## 2021-05-31 ENCOUNTER — Encounter (INDEPENDENT_AMBULATORY_CARE_PROVIDER_SITE_OTHER): Payer: Self-pay

## 2021-05-31 DIAGNOSIS — Z4802 Encounter for removal of sutures: Secondary | ICD-10-CM

## 2021-05-31 NOTE — Telephone Encounter (Signed)
Pt called back and stated that she is interested in the cardiac rehab program I went over insurance with her and the cardiac rehab process. Pt understood I advised pt of the backlog we currently have right now and that we will contact her after her f/u.

## 2021-06-04 NOTE — Progress Notes (Signed)
NEUROLOGY FOLLOW UP OFFICE NOTE  SEBRENA ENGH [id number]  Assessment/Plan:   Cervicogenic headache/cervicalgia  Check cervical spine X-ray to assess for spondylosis Continue gabapentin 200mg  at bedtime.  Advised to give it a little more time to see if her body adapts.  If no improvement in 6 weeks, contact me Limit use of pain relievers to no more than 2 days out of week to prevent risk of rebound or medication-overuse headache. Follow up 4 to 6 months.  Subjective:  [name] [name] is a 75 year old right-handed woman with hypertension, migraines and carpal tunnel syndrome who follows up for headache.   UPDATE: Remained on gabapentin 100mg  at bedtime but increased to 200mg  2 days ago.  It makes her a little sleepy.  Did not go to physical therapy.  She has had a lot happen.  She underwent surgery 2 weeks ago for mitral valve repair.  Still trying to recover.  Headaches have gradually improved.  They are mild, lasting 2 hours with Fioricet, and occurring 2 to 3 days a month.  Still with cervicogenic headache with cervicalgia.     Current analgesics:  Tylenol, Fioricet Current Antihypertensive medications:  Toprol-XL, losartan Current antiepileptic:  Gabapentin 200mg  QHS   Caffeine:  1 cup coffee daily.  Rarely tea or soda. Diet:  She tries drinking plenty of water during the day.  She does not skip meals Depression:  no; Anxiety:  no Other pain:  Knee pain Sleep hygiene:  Wakes up frequently during the night   HISTORY: Initially saw her for headache in 2020: Onset:  She has history of migraines for many years.  She presents for this different headache which began in her 26s. Location:  Left greater than right temple Quality:  Electric shock Intensity:  10/10.  She denies new headache, thunderclap headache or severe headache that wakes her from sleep. Associated symptoms:  Usually no associated symptoms.  Sometimes there is associated conjunctival injection on side of  pain.  Photophobia as well.  No associated neck pain.  She denies associated phonophobia, osmophobia, nausea, vomiting, visual disturbance or unilateral numbness or weakness. Duration:  5 seconds Frequency:  2 to 3 times a day and it may occur again in 2 to 4 weeks. Frequency of abortive medication: rarely Triggers:  Possibly sweet foods Relieving factors: Tylenol, Fioricet Activity:  No CT head on 11/15/2019 Showed calcified 1.2 x 1.3 cm meningioma over the left frontal convexities.  Cervicalgia: Onset 2021.  It is a dull persistent bilateral pain in back of neck radiating up both occipital regions to top of head.  Pain aggravated turning head to either side or looking up.  She feels a soft "bump" on both sides of posterior head. No paresthesias on back of head.  No radicular pain down the arms.  No preceding neck/head trauma or strenuous activity involving the neck.  She denies prior history of neck pain.  She has been treating with Tylenol and gabapentin.  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     Past NSAIDS:  ibuprofen  Past analgesics:  tramadol Past antihypertensive medications:  clonidine Past antidepressant medications:  none Past anticonvulsant medications:  gabapentin   Family history of headache:  No   Neuralgia, probably residual from prior TMJ inflammation  PAST MEDICAL HISTORY: Past Medical History:  Diagnosis Date   Arthritis    Atypical chest pain    Diverticulosis    Gastritis 11/08/2007, 04/12/2012  H pylori bx neg 03/2012   GERD (gastroesophageal reflux disease) 11/08/2007, 04/12/2012   Hepatic cyst    Hiatal hernia 11/08/2007, 04/12/2012   History of kidney stones    Hyperlipidemia    Hypertension    IBS (irritable bowel syndrome)    diarrhea predom   Migraine    Mitral regurgitation    Osteoporosis    DEXA 01/21/2012: -2.1 spine and R fem, -1.8 L fem s/p fosamax  2004-2011 DEXA 04/18/14 @  [facility]: -2.5 - rec to start Prolia     PVC's (premature ventricular contractions)    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     amiodarone (PACERONE) 200 MG tablet Take 1 tablet (200 mg total) by mouth 2 (two) times daily after a meal. X 7 days, then decrease to 200 mg ( 1 tab) daily 60 tablet 1   aspirin EC 81 MG EC tablet Take 1 tablet (81 mg total) by mouth daily. Swallow whole. 30 tablet 11   atorvastatin (LIPITOR) 40 MG tablet Take 1/2 (one-half) tablet by mouth once daily 45 tablet 2   butalbital-acetaminophen-caffeine (FIORICET) 50-325-40 MG tablet TAKE 1 TABLET BY MOUTH EVERY 4 HOURS AS NEEDED FOR HEADACHE 30 tablet 0   calcium carbonate (OS-CAL - DOSED IN MG OF ELEMENTAL CALCIUM) 1250 (500 Ca) MG tablet Take 1 tablet by mouth at bedtime.     Cholecalciferol (VITAMIN D3) 1000 units CAPS Take 1,000 Units by mouth at bedtime.     cyanocobalamin 1000 MCG tablet Take 1,000 mcg by mouth daily.     furosemide (LASIX) 20 MG tablet Take 20 mg by mouth daily.     gabapentin (NEURONTIN) 100 MG capsule Take 1 capsule (100 mg total) by mouth at bedtime.     losartan (COZAAR) 25 MG tablet Take 1 tablet (25 mg total) by mouth daily. 30 tablet 3   metoprolol tartrate (LOPRESSOR) 25 MG tablet Take 0.5 tablets (12.5 mg total) by mouth 2 (two) times daily. 60 tablet 3   oxyCODONE (OXY IR/ROXICODONE) 5 MG immediate release tablet Take 1 tablet (5 mg total) by mouth every 4 (four) hours as needed for severe pain. 30 tablet 0   Peppermint Oil (IBGARD) 90 MG CPCR Take as directed 16 capsule 0    potassium chloride SA (KLOR-CON) 20 MEQ tablet 1/2 tablet daily     warfarin (COUMADIN) 1 MG tablet Take 1 tablet (1 mg total) by mouth daily at 4 PM. 30 tablet 3   No current facility-administered medications on file prior to visit.    ALLERGIES: No Known Allergies  FAMILY HISTORY: Family History  Problem Relation Age of Onset   Hypertension Mother    Alzheimer's disease Mother 23   AAA (abdominal aortic aneurysm) Mother    Stroke Father 15   Kidney cancer Brother        mets   Bone cancer Brother 34   Colon cancer Neg Hx       Objective:  Blood pressure 115/71, pulse 80, resp. rate 18, height 5\' 3"  (1.6 m), weight 131 lb (59.4 kg), SpO2 95 %. General: No acute distress.  Patient appears well-groomed.   Head:  Normocephalic/atraumatic  Metta Clines, DO  CC: Billey Gosling, MD

## 2021-06-05 ENCOUNTER — Ambulatory Visit: Payer: PPO | Admitting: Neurology

## 2021-06-05 ENCOUNTER — Other Ambulatory Visit: Payer: Self-pay

## 2021-06-05 ENCOUNTER — Ambulatory Visit
Admission: RE | Admit: 2021-06-05 | Discharge: 2021-06-05 | Disposition: A | Discharge status: Home / Self Care | Payer: PPO | Source: Ambulatory Visit | Attending: Neurology | Admitting: Neurology

## 2021-06-05 ENCOUNTER — Encounter: Payer: Self-pay | Admitting: Neurology

## 2021-06-05 ENCOUNTER — Ambulatory Visit (INDEPENDENT_AMBULATORY_CARE_PROVIDER_SITE_OTHER): Payer: PPO

## 2021-06-05 VITALS — BP 115/71 | HR 80 | Resp 18 | Ht 63.0 in | Wt 131.0 lb

## 2021-06-05 DIAGNOSIS — I340 Nonrheumatic mitral (valve) insufficiency: Secondary | ICD-10-CM

## 2021-06-05 DIAGNOSIS — Z7901 Long term (current) use of anticoagulants: Secondary | ICD-10-CM

## 2021-06-05 DIAGNOSIS — G4486 Cervicogenic headache: Secondary | ICD-10-CM

## 2021-06-05 DIAGNOSIS — Z9889 Other specified postprocedural states: Secondary | ICD-10-CM | POA: Diagnosis not present

## 2021-06-05 DIAGNOSIS — Z5181 Encounter for therapeutic drug level monitoring: Secondary | ICD-10-CM | POA: Diagnosis not present

## 2021-06-05 DIAGNOSIS — M542 Cervicalgia: Secondary | ICD-10-CM | POA: Diagnosis not present

## 2021-06-05 DIAGNOSIS — M47812 Spondylosis without myelopathy or radiculopathy, cervical region: Secondary | ICD-10-CM | POA: Diagnosis not present

## 2021-06-05 LAB — POCT INR: INR: 1.3 — AB (ref 2.0–3.0)

## 2021-06-05 NOTE — Patient Instructions (Signed)
Continue gabapentin 200mg  at bedtime.  If no improvement in 6 weeks, contact me. Will check X-ray of cervical spine Limit use of pain relievers to no more than 2 days out of week to prevent risk of rebound or medication-overuse headache. Follow up 4 to 6 months.

## 2021-06-05 NOTE — Patient Instructions (Signed)
Take 2 tablets tonight only and then increase to 1 tablet Daily, except 1.5 tablets on Monday and Friday INR in 1 week. [phone]

## 2021-06-10 ENCOUNTER — Telehealth: Payer: Self-pay | Admitting: Pharmacist

## 2021-06-10 NOTE — Chronic Care Management (AMB) (Signed)
Chronic Care Management Pharmacy Assistant   Name: [name] [name]  MRN: [id number] DOB: May 19, 1946   Reason for Encounter: Medication Adherence Call   Hospital visits:  Medication Reconciliation was completed by comparing discharge summary, patient's EMR and Pharmacy list, and upon discussion with patient.  Admitted to the hospital on  due to 05/21/21 for Mittal Valve Repair /Discharge date was 05/26/21 . Discharged from [place]?Medications Started at [facility] Discharge:?? No new medications were started.  Medication Changes at Hospital Discharge: No medication change noted.  Medications Discontinued at Hospital Discharge: No medications were discontinued.  Medications that remain the same after Hospital Discharge:??  -All other medications will remain the same.    Medications: Outpatient Encounter Medications as of 06/10/2021  Medication Sig   acetaminophen (TYLENOL) 500 MG tablet Take 500 mg by mouth daily.   amiodarone (PACERONE) 200 MG tablet Take 1 tablet (200 mg total) by mouth 2 (two) times daily after a meal. X 7 days, then decrease to 200 mg ( 1 tab) daily   aspirin EC 81 MG EC tablet Take 1 tablet (81 mg total) by mouth daily. Swallow whole.   atorvastatin (LIPITOR) 20 MG tablet Take 20 mg by mouth daily.   atorvastatin (LIPITOR) 40 MG tablet Take 1/2 (one-half) tablet by mouth once daily   butalbital-acetaminophen-caffeine (FIORICET) 50-325-40 MG tablet TAKE 1 TABLET BY MOUTH EVERY 4 HOURS AS NEEDED FOR HEADACHE   calcium carbonate (OS-CAL - DOSED IN MG OF ELEMENTAL CALCIUM) 1250 (500 Ca) MG tablet Take 1 tablet by mouth at bedtime.   Cholecalciferol (VITAMIN D3) 1000 units CAPS Take 1,000 Units by mouth at bedtime.   cyanocobalamin 1000 MCG tablet Take 1,000 mcg by mouth daily.   furosemide (LASIX) 20 MG tablet Take 20 mg by mouth daily.   gabapentin (NEURONTIN) 100 MG capsule Take 1 capsule (100 mg total) by mouth at bedtime.   losartan  (COZAAR) 25 MG tablet Take 1 tablet (25 mg total) by mouth daily.   losartan (COZAAR) 50 MG tablet Take 50 mg by mouth daily.   metoprolol tartrate (LOPRESSOR) 25 MG tablet Take 0.5 tablets (12.5 mg total) by mouth 2 (two) times daily.   oxyCODONE (OXY IR/ROXICODONE) 5 MG immediate release tablet Take 1 tablet (5 mg total) by mouth every 4 (four) hours as needed for severe pain.   Peppermint Oil (IBGARD) 90 MG CPCR Take as directed   potassium chloride SA (KLOR-CON) 20 MEQ tablet 1/2 tablet daily   warfarin (COUMADIN) 1 MG tablet Take 1 tablet (1 mg total) by mouth daily at 4 PM.   No facility-administered encounter medications on file as of 06/10/2021.    Medications   acetaminophen (TYLENOL) 500 MG tablet amiodarone (PACERONE) 200 MG tablet - last filled 05/26/21 aspirin EC 81 MG EC tablet atorvastatin (LIPITOR) 20 MG tablet - last filled 05/20/21 30 days  atorvastatin (LIPITOR) 40 MG tablet - last filled 12/07/20 90 days  butalbital-acetaminophen-caffeine (FIORICET) 50-325-40 MG tablet - last filled 03/13/20 5 days  calcium carbonate (OS-CAL - DOSED IN MG OF ELEMENTAL CALCIUM) 1250 (500 Ca) MG tablet Cholecalciferol (VITAMIN D3) 1000 units CAPS cyanocobalamin 1000 MCG tablet furosemide (LASIX) 20 MG tablet - last filled 05/20/21 30 days  gabapentin (NEURONTIN) 100 MG capsule - last filled 05/20/21 30 days  losartan (COZAAR) 25 MG tablet - last filled 05/26/21 30 days  losartan (COZAAR) 50 MG tablet - last filled 05/20/21 30 days  metoprolol tartrate (LOPRESSOR) 25  MG tablet 05/26/21 60 days  oxyCODONE (OXY IR/ROXICODONE) 5 MG imm - last filled ediate release tablet - last filled 05/26/21 5 days  Peppermint Oil (IBGARD) 90 MG CPCR potassium chloride SA (KLOR-CON) 20 MEQ tablet    Star Rating Drugs: atorvastatin (LIPITOR) 20 MG tablet - last filled 05/20/21 30 days  atorvastatin (LIPITOR) 40 MG tablet - last filled 12/07/20 90 days  losartan (COZAAR) 25 MG tablet - last filled 05/26/21 30 days   losartan (COZAAR) 50 MG tablet - last filled 05/20/21 30 days    [place] Pharmacist Assistant [phone]

## 2021-06-11 ENCOUNTER — Telehealth: Payer: Self-pay | Admitting: Pharmacist

## 2021-06-11 NOTE — Progress Notes (Deleted)
error 

## 2021-06-11 NOTE — Progress Notes (Signed)
    Chronic Care Management Pharmacy Assistant   Name: [name] [name]  MRN: [id number] DOB: [birth year]  Error

## 2021-06-12 LAB — ECHO INTRAOPERATIVE TEE
AR max vel: 2.22 cm2
AV Area VTI: 2.06 cm2
AV Area mean vel: 1.84 cm2
AV Mean grad: 5 mmHg
AV Peak grad: 11.7 mmHg
Ao pk vel: 1.71 m/s
Height: 62 in
MV M vel: 4.13 m/s
MV Peak grad: 68.2 mmHg
MV VTI: 1.92 cm2
Radius: 1.2 cm
S' Lateral: 3.09 cm
Weight: 2096 oz

## 2021-06-12 NOTE — Chronic Care Management (AMB) (Signed)
Chronic Care Management Pharmacy Assistant   Name: [name] [name]    MRN: [id number]       DOB: 16-Dec-1945     Reason for Encounter: Medication Adherence Call   Hospital visits:  Medication Reconciliation was completed by comparing discharge summary, patient's EMR and Pharmacy list, and upon discussion with patient.   Admitted to the hospital on  due to 05/21/21 for Mittal Valve Repair /Discharge date was 05/26/21 . Discharged from [place]?Medications Started at [facility] Discharge:?? No new medications were started.   Medication Changes at Hospital Discharge: No medication change noted.   Medications Discontinued at Hospital Discharge: No medications were discontinued.   Medications that remain the same after Hospital Discharge:?? -All other medications will remain the same.     Medications:     Outpatient Encounter Medications as of 06/10/2021  Medication Sig   acetaminophen (TYLENOL) 500 MG tablet Take 500 mg by mouth daily.   amiodarone (PACERONE) 200 MG tablet Take 1 tablet (200 mg total) by mouth 2 (two) times daily after a meal. X 7 days, then decrease to 200 mg ( 1 tab) daily   aspirin EC 81 MG EC tablet Take 1 tablet (81 mg total) by mouth daily. Swallow whole.   atorvastatin (LIPITOR) 20 MG tablet Take 20 mg by mouth daily.   atorvastatin (LIPITOR) 40 MG tablet Take 1/2 (one-half) tablet by mouth once daily   butalbital-acetaminophen-caffeine (FIORICET) 50-325-40 MG tablet TAKE 1 TABLET BY MOUTH EVERY 4 HOURS AS NEEDED FOR HEADACHE   calcium carbonate (OS-CAL - DOSED IN MG OF ELEMENTAL CALCIUM) 1250 (500 Ca) MG tablet Take 1 tablet by mouth at bedtime.   Cholecalciferol (VITAMIN D3) 1000 units CAPS Take 1,000 Units by mouth at bedtime.   cyanocobalamin 1000 MCG tablet Take 1,000 mcg by mouth daily.   furosemide (LASIX) 20 MG tablet Take 20 mg by mouth daily.   gabapentin (NEURONTIN) 100 MG capsule Take 1 capsule (100 mg total) by mouth at  bedtime.   losartan (COZAAR) 25 MG tablet Take 1 tablet (25 mg total) by mouth daily.   losartan (COZAAR) 50 MG tablet Take 50 mg by mouth daily.   metoprolol tartrate (LOPRESSOR) 25 MG tablet Take 0.5 tablets (12.5 mg total) by mouth 2 (two) times daily.   oxyCODONE (OXY IR/ROXICODONE) 5 MG immediate release tablet Take 1 tablet (5 mg total) by mouth every 4 (four) hours as needed for severe pain.   Peppermint Oil (IBGARD) 90 MG CPCR Take as directed   potassium chloride SA (KLOR-CON) 20 MEQ tablet 1/2 tablet daily   warfarin (COUMADIN) 1 MG tablet Take 1 tablet (1 mg total) by mouth daily at 4 PM.    No facility-administered encounter medications on file as of 06/10/2021.      Medications    acetaminophen (TYLENOL) 500 MG tablet amiodarone (PACERONE) 200 MG tablet - last filled 05/26/21 aspirin EC 81 MG EC tablet atorvastatin (LIPITOR) 20 MG tablet - last filled 05/20/21 30 days atorvastatin (LIPITOR) 40 MG tablet - last filled 12/07/20 90 days butalbital-acetaminophen-caffeine (FIORICET) 50-325-40 MG tablet - last filled 03/13/20 5 days calcium carbonate (OS-CAL - DOSED IN MG OF ELEMENTAL CALCIUM) 1250 (500 Ca) MG tablet Cholecalciferol (VITAMIN D3) 1000 units CAPS cyanocobalamin 1000 MCG tablet furosemide (LASIX) 20 MG tablet - last filled 05/20/21 30 days gabapentin (NEURONTIN) 100 MG capsule - last filled 05/20/21 30 days losartan (COZAAR) 25 MG tablet - last filled 05/26/21  30 days losartan (COZAAR) 50 MG tablet - last filled 05/20/21 30 days metoprolol tartrate (LOPRESSOR) 25 MG tablet 05/26/21 60 days oxyCODONE (OXY IR/ROXICODONE) 5 MG imm - last filled ediate release tablet - last filled 05/26/21 5 days Peppermint Oil (IBGARD) 90 MG CPCR potassium chloride SA (KLOR-CON) 20 MEQ tablet       Star Rating Drugs: atorvastatin (LIPITOR) 20 MG tablet - last filled 05/20/21 30 days atorvastatin (LIPITOR) 40 MG tablet - last filled 12/07/20 90 days losartan (COZAAR) 25 MG tablet - last filled  05/26/21 30 days losartan (COZAAR) 50 MG tablet - last filled 05/20/21 30 days     [place] Pharmacist Assistant [phone]

## 2021-06-13 ENCOUNTER — Other Ambulatory Visit: Payer: Self-pay | Admitting: Surgery

## 2021-06-13 DIAGNOSIS — Z9889 Other specified postprocedural states: Secondary | ICD-10-CM

## 2021-06-14 ENCOUNTER — Ambulatory Visit (INDEPENDENT_AMBULATORY_CARE_PROVIDER_SITE_OTHER): Payer: PPO

## 2021-06-14 ENCOUNTER — Other Ambulatory Visit: Payer: Self-pay

## 2021-06-14 ENCOUNTER — Telehealth: Payer: Self-pay | Admitting: Pharmacist

## 2021-06-14 DIAGNOSIS — I340 Nonrheumatic mitral (valve) insufficiency: Secondary | ICD-10-CM

## 2021-06-14 DIAGNOSIS — Z5181 Encounter for therapeutic drug level monitoring: Secondary | ICD-10-CM

## 2021-06-14 DIAGNOSIS — Z9889 Other specified postprocedural states: Secondary | ICD-10-CM

## 2021-06-14 DIAGNOSIS — Z7901 Long term (current) use of anticoagulants: Secondary | ICD-10-CM

## 2021-06-14 LAB — POCT INR: INR: 1.2 — AB (ref 2.0–3.0)

## 2021-06-14 NOTE — Patient Instructions (Signed)
Take 2 tablets tonight only and then increase to 1.5 tablets Daily, except 1 tablet on Monday, Wednesday and Friday INR in 1 week. [phone]

## 2021-06-14 NOTE — Progress Notes (Signed)
Chronic Care Management Pharmacy Assistant   Name: [name] [name]  MRN: [id number] DOB: 09-29-1946   Reason for Encounter: Medication [place] Hospital visits:  Medication Reconciliation was completed by comparing discharge summary, patient's EMR and Pharmacy list, and upon discussion with patient.  Admitted to the hospital on 05/21/21  due to S/p minimally-invasive mitral valve repair. Discharge date was 05/26/21. Discharged from [place]?Medications Started at [facility] Discharge:?? -started None ID   Medication Changes at Hospital Discharge: -Changed None ID  Medications Discontinued at Hospital Discharge: -Stopped None ID   Medications that remain the same after Hospital Discharge:??  -All other medications will remain the same.    Medications: Outpatient Encounter Medications as of 06/14/2021  Medication Sig   acetaminophen (TYLENOL) 500 MG tablet Take 500 mg by mouth daily.   amiodarone (PACERONE) 200 MG tablet Take 1 tablet (200 mg total) by mouth 2 (two) times daily after a meal. X 7 days, then decrease to 200 mg ( 1 tab) daily   aspirin EC 81 MG EC tablet Take 1 tablet (81 mg total) by mouth daily. Swallow whole.   atorvastatin (LIPITOR) 20 MG tablet Take 20 mg by mouth daily.   atorvastatin (LIPITOR) 40 MG tablet Take 1/2 (one-half) tablet by mouth once daily   butalbital-acetaminophen-caffeine (FIORICET) 50-325-40 MG tablet TAKE 1 TABLET BY MOUTH EVERY 4 HOURS AS NEEDED FOR HEADACHE   calcium carbonate (OS-CAL - DOSED IN MG OF ELEMENTAL CALCIUM) 1250 (500 Ca) MG tablet Take 1 tablet by mouth at bedtime.   Cholecalciferol (VITAMIN D3) 1000 units CAPS Take 1,000 Units by mouth at bedtime.   cyanocobalamin 1000 MCG tablet Take 1,000 mcg by mouth daily.   furosemide (LASIX) 20 MG tablet Take 20 mg by mouth daily.   gabapentin (NEURONTIN) 100 MG capsule Take 1 capsule (100 mg total) by mouth at bedtime.   losartan (COZAAR) 25 MG tablet Take 1  tablet (25 mg total) by mouth daily.   losartan (COZAAR) 50 MG tablet Take 50 mg by mouth daily.   metoprolol tartrate (LOPRESSOR) 25 MG tablet Take 0.5 tablets (12.5 mg total) by mouth 2 (two) times daily.   oxyCODONE (OXY IR/ROXICODONE) 5 MG immediate release tablet Take 1 tablet (5 mg total) by mouth every 4 (four) hours as needed for severe pain.   Peppermint Oil (IBGARD) 90 MG CPCR Take as directed   potassium chloride SA (KLOR-CON) 20 MEQ tablet 1/2 tablet daily   warfarin (COUMADIN) 1 MG tablet Take 1 tablet (1 mg total) by mouth daily at 4 PM.   No facility-administered encounter medications on file as of 06/14/2021.    Reviewed chart for medication changes ahead of medication coordination call.  No OVs, Consults, or hospital visits since last care coordination call/Pharmacist visit. (If appropriate, list visit date, provider name)  No medication changes indicated OR if recent visit, treatment plan here.  BP Readings from Last 3 Encounters:  06/05/21 115/71  05/26/21 128/66  05/20/21 (!) 121/56    Lab Results  Component Value Date   HGBA1C 5.6 02/19/2021     Patient obtains medications through Vials  30 Days   Last adherence delivery included:  Furosemide 20 mg 2 tabs once daily Metoprolol 25 mg 1/2 tab am and 1/2 tab pm Losartan 50 mg 1 tab daily Gabapentin 100 mg 2 caps at bedtime  Patient is due for next adherence delivery on: 06/20/21.  Called patient and reviewed medications and  coordinated delivery. This delivery to include: Furosemide 20 mg 2 tabs once daily Losartan 50 mg 1 tab daily Gabapentin 100 mg 2 caps at bedtime   Patient declined the following medications Metoprolol 25 mg and Potassium 20 meq, patient states has enough for 30 more days  Patient needs refills for none noted.  Confirmed delivery date of 06/20/21, advised patient that pharmacy will contact them the morning of delivery.   Star Rating Drugs: Losartan 50 mg 05/26/21 30 ds Atorvastatin  20 mg 06/10/21 90 ds  Ethelene Hal Clinical Pharmacist Assistant [phone]   Time spent:40

## 2021-06-17 ENCOUNTER — Ambulatory Visit (INDEPENDENT_AMBULATORY_CARE_PROVIDER_SITE_OTHER): Payer: Self-pay | Admitting: Physician Assistant

## 2021-06-17 ENCOUNTER — Other Ambulatory Visit: Payer: Self-pay

## 2021-06-17 ENCOUNTER — Ambulatory Visit
Admission: RE | Admit: 2021-06-17 | Discharge: 2021-06-17 | Disposition: A | Discharge status: Home / Self Care | Payer: PPO | Source: Ambulatory Visit | Attending: Surgery | Admitting: Surgery

## 2021-06-17 VITALS — BP 109/66 | HR 83 | Resp 20 | Ht 63.0 in | Wt 133.0 lb

## 2021-06-17 DIAGNOSIS — Z9889 Other specified postprocedural states: Secondary | ICD-10-CM

## 2021-06-17 DIAGNOSIS — Z952 Presence of prosthetic heart valve: Secondary | ICD-10-CM | POA: Diagnosis not present

## 2021-06-17 MED ORDER — FUROSEMIDE 20 MG PO TABS: 20.0000 mg | ORAL_TABLET | Freq: Every day | ORAL | Status: DC | PRN

## 2021-06-17 MED ORDER — AMIODARONE HCL 200 MG PO TABS: 200.0000 mg | ORAL_TABLET | Freq: Every day | ORAL | 1 refills | Status: DC

## 2021-06-17 MED ORDER — POTASSIUM CHLORIDE CRYS ER 20 MEQ PO TBCR: EXTENDED_RELEASE_TABLET | ORAL | Status: DC

## 2021-06-17 NOTE — Patient Instructions (Signed)
Endocarditis is a potentially serious infection of heart valves or inside lining of the heart.  It occurs more commonly in patients with diseased heart valves (such as patient's with aortic or mitral valve disease) and in patients who have undergone heart valve repair or replacement.  Certain surgical and dental procedures may put you at risk, such as dental cleaning, other dental procedures, or any surgery involving the respiratory, urinary, gastrointestinal tract, gallbladder or prostate gland.   To minimize your chances for develooping endocarditis, maintain good oral health and seek prompt medical attention for any infections involving the mouth, teeth, gums, skin or urinary tract.    Always notify your doctor or dentist about your underlying heart valve condition before having any invasive procedures. You will need to take antibiotics before certain procedures, including all routine dental cleanings or other dental procedures.  Your cardiologist or dentist should prescribe these antibiotics for you to be taken ahead of time.  Make every effort to maintain a "heart-healthy" lifestyle with regular physical exercise and adherence to a low-fat, low-carbohydrate diet.  Continue to seek regular follow-up appointments with your primary care physician and/or cardiologist.  You may continue to gradually increase your physical activity as tolerated.  Refrain from any heavy lifting or strenuous use of your arms and shoulders until at least 8 weeks from the time of your surgery, and avoid activities that cause increased pain in your chest on the side of your surgical incision.  Otherwise you may continue to increase activities without any particular limitations.  Increase the intensity and duration of physical activity gradually.

## 2021-06-17 NOTE — Progress Notes (Signed)
HPI: Patient returns for routine postoperative follow-up having undergone Minimally Invasive Mitral Valve Repair and Clipping of LA Appendage on 05/21/2021. The patient's early postoperative recovery while in the hospital was notable for episode of rapid Atrial Fibrillation which responded to IV Amiodarone.  She was discharged home on 05/26/2021. Since hospital discharge the patient reports for the most part that she is doing well.  However overall she doesn't have a lot of energy or stamina and doesn't feel like she can do a whole lot.  She has some pain occasionally but she isn't taking pain medication.  She is ambulating.  She has been monitoring vitals and her blood pressure has been low at times.    Current Outpatient Medications  Medication Sig Dispense Refill   acetaminophen (TYLENOL) 500 MG tablet Take 500 mg by mouth daily.     amiodarone (PACERONE) 200 MG tablet Take 1 tablet (200 mg total) by mouth 2 (two) times daily after a meal. X 7 days, then decrease to 200 mg ( 1 tab) daily 60 tablet 1   aspirin EC 81 MG EC tablet Take 1 tablet (81 mg total) by mouth daily. Swallow whole. 30 tablet 11   atorvastatin (LIPITOR) 20 MG tablet Take 20 mg by mouth daily.     atorvastatin (LIPITOR) 40 MG tablet Take 1/2 (one-half) tablet by mouth once daily 45 tablet 2   butalbital-acetaminophen-caffeine (FIORICET) 50-325-40 MG tablet TAKE 1 TABLET BY MOUTH EVERY 4 HOURS AS NEEDED FOR HEADACHE 30 tablet 0   calcium carbonate (OS-CAL - DOSED IN MG OF ELEMENTAL CALCIUM) 1250 (500 Ca) MG tablet Take 1 tablet by mouth at bedtime.     Cholecalciferol (VITAMIN D3) 1000 units CAPS Take 1,000 Units by mouth at bedtime.     cyanocobalamin 1000 MCG tablet Take 1,000 mcg by mouth daily.     furosemide (LASIX) 20 MG tablet Take 20 mg by mouth daily.     gabapentin (NEURONTIN) 100 MG capsule Take 1 capsule (100 mg total) by mouth at bedtime.     losartan (COZAAR) 25 MG tablet Take 1 tablet (25 mg total) by mouth daily.  30 tablet 3   losartan (COZAAR) 50 MG tablet Take 50 mg by mouth daily.     metoprolol tartrate (LOPRESSOR) 25 MG tablet Take 0.5 tablets (12.5 mg total) by mouth 2 (two) times daily. 60 tablet 3   oxyCODONE (OXY IR/ROXICODONE) 5 MG immediate release tablet Take 1 tablet (5 mg total) by mouth every 4 (four) hours as needed for severe pain. 30 tablet 0   Peppermint Oil (IBGARD) 90 MG CPCR Take as directed 16 capsule 0   potassium chloride SA (KLOR-CON) 20 MEQ tablet 1/2 tablet daily     warfarin (COUMADIN) 1 MG tablet Take 1 tablet (1 mg total) by mouth daily at 4 PM. 30 tablet 3   No current facility-administered medications for this visit.    Physical Exam:  BP 109/66 (BP Location: Left Arm, Patient Position: Sitting)   Pulse 83   Resp 20   Ht '5\' 3"'$  (1.6 m)   Wt 133 lb (60.3 kg)   SpO2 95% Comment: RA  BMI 23.56 kg/m    Gen: looks great NAD  Heart: RRR Lungs: CTA bilaterally Abd: soft non-tender, non-distended  Ext: no edema Incisions: clean and healing w/o erythema or evidence of infection  Diagnostic Tests:  CXR: trace left pleural effusion   A/P:  S/P Minimally Invasive MV Repair with LA Clip- in NSR on ausculation.  Overall doing well, however has persistent decreased energy and lack of stamina... her BP in the office today was low at 109/66... will stop Cozaar to see if higher BP would help her symptoms.  Will also change Lasix to prn dosing for weight gain as she has no edema on exam today.  Also her last hemoglobin in the hospital was 8.5, will check CBC to ensure this has improved and patient isn't anemic which would attribute to her symptoms INR- low at 1.2 on check 7/22, currently taking 1.5/1 mg of coumadin daily Cardiac Rehab- okay to get scheduled if patient's wishes to participate RTC in 4 weeks,  resume follow up with Cardiologist on regular basis, Echocardiogram on 8/16  Ellwood Handler, PA-C [facility] [phone]

## 2021-06-18 ENCOUNTER — Ambulatory Visit (INDEPENDENT_AMBULATORY_CARE_PROVIDER_SITE_OTHER): Payer: PPO | Admitting: Pharmacist

## 2021-06-18 DIAGNOSIS — H26493 Other secondary cataract, bilateral: Secondary | ICD-10-CM | POA: Diagnosis not present

## 2021-06-18 DIAGNOSIS — H04123 Dry eye syndrome of bilateral lacrimal glands: Secondary | ICD-10-CM | POA: Diagnosis not present

## 2021-06-18 DIAGNOSIS — H524 Presbyopia: Secondary | ICD-10-CM | POA: Diagnosis not present

## 2021-06-18 DIAGNOSIS — I10 Essential (primary) hypertension: Secondary | ICD-10-CM | POA: Diagnosis not present

## 2021-06-18 DIAGNOSIS — I4891 Unspecified atrial fibrillation: Secondary | ICD-10-CM | POA: Diagnosis not present

## 2021-06-18 DIAGNOSIS — M810 Age-related osteoporosis without current pathological fracture: Secondary | ICD-10-CM

## 2021-06-18 DIAGNOSIS — H11043 Peripheral pterygium, stationary, bilateral: Secondary | ICD-10-CM | POA: Diagnosis not present

## 2021-06-18 DIAGNOSIS — H52222 Regular astigmatism, left eye: Secondary | ICD-10-CM | POA: Diagnosis not present

## 2021-06-18 DIAGNOSIS — I509 Heart failure, unspecified: Secondary | ICD-10-CM

## 2021-06-18 DIAGNOSIS — H353131 Nonexudative age-related macular degeneration, bilateral, early dry stage: Secondary | ICD-10-CM | POA: Diagnosis not present

## 2021-06-18 DIAGNOSIS — H5211 Myopia, right eye: Secondary | ICD-10-CM | POA: Diagnosis not present

## 2021-06-18 DIAGNOSIS — H5202 Hypermetropia, left eye: Secondary | ICD-10-CM | POA: Diagnosis not present

## 2021-06-18 DIAGNOSIS — E782 Mixed hyperlipidemia: Secondary | ICD-10-CM

## 2021-06-18 NOTE — Patient Instructions (Signed)
Visit Information  Phone number for Pharmacist: [phone]   Goals Addressed             This Visit's Progress    Manage My Medicine       Timeframe:  Long-Range Goal Priority:  High Start Date:       06/18/21                      Expected End Date:     12/19/21                  Follow Up Date Oct 2022   - call for medicine refill 2 or 3 days before it runs out - call if I am sick and can't take my medicine - keep a list of all the medicines I take; vitamins and herbals too - use a pillbox to sort medicine  -Stop taking losartan (as advised at CT surgery follow up) -Follow up with PCP for Prolia scheduling -Ensure consistent Vitamin K intake (leafy greens, broccoli)   Why is this important?   These steps will help you keep on track with your medicines.   Notes:         Patient verbalizes understanding of instructions provided today and agrees to view in [place].  Telephone follow up appointment with pharmacy team member scheduled for: 3 months  Charlene Brooke, PharmD, [place], CPP Clinical Pharmacist [facility] at [facility] [phone]

## 2021-06-18 NOTE — Progress Notes (Signed)
Chronic Care Management Pharmacy    06/18/2021 Name:  [name] [name] MRN:  [id number] DOB:  01/30/1946  Summary: -Pt is adherent with medication changes since MV repair/new Afib diagnosis (amiodarone, warfarin). She has not stopped losartan yet as advised by CT surgery yesterday. -Pt is overdue for Prolia injection (last given 07/2020, 12/2019, 04/2019)  Recommendations/Changes made from today's visit: -Advised to stop losartan as advised at CT Surgery f/u visit yesterday -Counseled on consistent intake of Vitamin K foods  -Coordinate scheduling of next Prolia shot (was due 01/2021)   Subjective: [name] [name] is an 75 y.o. year old female who is a primary patient of Burns, Claudina Lick, MD.  The CCM team was consulted for assistance with disease management and care coordination needs.    Engaged with patient by telephone for follow up visit in response to provider referral for pharmacy case management and/or care coordination services.   Consent to Services:  The patient was given information about Chronic Care Management services, agreed to services, and gave verbal consent prior to initiation of services.  Please see initial visit note for detailed documentation.   Patient Care Team: Binnie Rail, MD as PCP - General (Internal Medicine) Biagio Borg, MD as PCP - Family Medicine (Internal Medicine) Lorretta Harp, MD as PCP - Cardiology (Cardiology) Sable Feil, MD as Consulting Physician (Gastroenterology) Lyndal Pulley, DO (Sports Medicine) Lorretta Harp, MD (Cardiology) Pieter Partridge, DO as Consulting Physician (Neurology) Charlton Haws, [facility] as Pharmacist (Pharmacist)  Recent office visits: 02/21/21 Dr Quay Burow OV: Acute heart failure within last month. Increase Lasix to 40 mg and start Kcl 20 meq. Decrease losartan to 50 mg.  02/06/21 Dr Quay Burow OV: ED f/u (chest pain); ordered labs, CXR, ECHO.  08/23/20 Dr Quay Burow OV: chronic f/u. Prolia injection  given. rx'd gabapentin, referred to PT for neck pain.   Recent consult visits: 06/17/21 PA Erin Barrett (CT surgery): f/u MV repair. C/o fatigue. BP 109/66. D/C losartan, change furosemide to PRN for wt gain (take potassium only when taking Lasix). Reduce amiodarone to 200 mg daily  04/10/21 Dr Vaughan Browner (pulmonary): pulmonary effusions and opacities on CT likely due to mitral regurg and HF. Re-evaluate after MV repair.  04/09/21 Dr Roxy Manns (CT surgery): will proceed with MV repair. 03/20/21 Dr Gwenlyn Found (cardiology): f/u severe mitral regurg. Referred for mitral repair. 03/14/21 admission for TEE. 03/01/21 NP Cleaver (cardiology): f/u mitral valve regurg.  12/24/20 Dr Havery Moros (GI): worsening IBS-D after acute illness while travelling in [place],, Rx'd loperamide, peppermint oil, reduced pantoprazole to daily. Rec'd dicyclomine TID prn, low FODMAP diet, IBgard PRN.  12/03/20 Dr Tomi Likens (neurology): cervicalgia, headches. Referred to PT  08/06/20 colonoscopy  Hospital visits: Medication Reconciliation was completed by comparing discharge summary, patient's EMR and Pharmacy list, and upon discussion with patient.   Admitted to the hospital on 05/21/21  due to S/p minimally-invasive mitral valve repair w/ clipping of L atrial appendage. Discharge date was 05/26/21. Discharged from [place] [facility] on POD 3. Started amiodarone   New?Medications Started at [facility] Discharge:?? -amiodarone 200 mg daily -metoprolol tartrate 25 mg -1/2 tab BID -warfarin 1 mg   Medication Changes at Hospital Discharge: -Changed atorvastatin to 20 mg (rather than 40 mg 1/2 tab)   Medications Discontinued at Hospital Discharge: -Stopped metoprolol succinate   Medications that remain the same after Hospital Discharge:?? -All other medications will remain the same.    Objective:  Lab Results  Component  Value Date   CREATININE 0.87 05/26/2021   BUN 13 05/26/2021   GFR 60.82 02/19/2021   GFRNONAA >60  05/26/2021   NA 138 05/26/2021   K 4.1 05/26/2021   CALCIUM 8.8 (L) 05/26/2021   CO2 30 05/26/2021    Lab Results  Component Value Date/Time   HGBA1C 5.6 02/19/2021 07:37 AM   HGBA1C 5.6 11/02/2019 10:06 AM   GFR 60.82 02/19/2021 07:37 AM   GFR 67.03 05/01/2020 10:43 AM    Last diabetic Eye exam: No results found for: HMDIABEYEEXA  Last diabetic Foot exam: No results found for: HMDIABFOOTEX   Lab Results  Component Value Date   CHOL 176 02/19/2021   HDL 49.50 02/19/2021   LDLCALC 96 02/19/2021   LDLDIRECT 75.0 11/02/2019   TRIG 153.0 (H) 02/19/2021   CHOLHDL 4 02/19/2021    Hepatic Function Latest Ref Rng & Units 05/17/2021 02/19/2021 02/02/2021  Total Protein 6.5 - 8.1 g/dL 6.8 6.9 6.9  Albumin 3.5 - 5.0 g/dL 4.1 4.1 4.0  AST 15 - 41 U/L 21 20 33  ALT 0 - 44 U/L 25 28 57(H)  Alk Phosphatase 38 - 126 U/L 40 38(L) 47  Total Bilirubin 0.3 - 1.2 mg/dL 0.7 0.9 0.9  Bilirubin, Direct 0.0 - 0.2 mg/dL - - 0.2    Lab Results  Component Value Date/Time   TSH 0.92 02/19/2021 07:37 AM   TSH 0.72 02/20/2020 10:44 AM    CBC Latest Ref Rng & Units 05/24/2021 05/23/2021 05/22/2021  WBC 4.0 - 10.5 K/uL 12.2(H) 15.5(H) 15.5(H)  Hemoglobin 12.0 - 15.0 g/dL 8.5(L) 8.5(L) 8.7(L)  Hematocrit 36.0 - 46.0 % 25.8(L) 26.1(L) 27.1(L)  Platelets 150 - 400 K/uL 96(L) 94(L) 89(L)    Lab Results  Component Value Date/Time   VD25OH 76.73 02/20/2020 10:44 AM   VD25OH 48.4 06/30/2017 08:43 AM   VD25OH 51 01/21/2012 08:16 AM    Clinical ASCVD: No  The 10-year ASCVD risk score Mikey Bussing DC Jr., et al., 2013) is: 17.2%   Values used to calculate the score:     Age: 75 years     Sex: Female     Is Non-Hispanic African American: No     Diabetic: No     Tobacco smoker: No     Systolic Blood Pressure: 768 mmHg     Is BP treated: Yes     HDL Cholesterol: 49.5 mg/dL     Total Cholesterol: 176 mg/dL    CHA2DS2-VASc Score = 5  The patient's score is based upon: CHF History: Yes HTN History:  Yes Diabetes History: No Stroke History: No Vascular Disease History: No Age Score: 2 Gender Score: 1     Depression screen [facility] 2/9 04/24/2020 05/02/2019 07/20/2017  Decreased Interest 0 0 0  Down, Depressed, Hopeless 0 0 0  PHQ - 2 Score 0 0 0  Some recent data might be hidden     Social History   Tobacco Use  Smoking Status Never  Smokeless Tobacco Never   BP Readings from Last 3 Encounters:  06/17/21 109/66  06/05/21 115/71  05/26/21 128/66   Pulse Readings from Last 3 Encounters:  06/17/21 83  06/05/21 80  05/26/21 77   Wt Readings from Last 3 Encounters:  06/17/21 133 lb (60.3 kg)  06/05/21 131 lb (59.4 kg)  05/26/21 133 lb 6.4 oz (60.5 kg)    Assessment/Interventions: Review of patient past medical history, allergies, medications, health status, including review of consultants reports, laboratory and other test data, was  performed as part of comprehensive evaluation and provision of chronic care management services.   SDOH:  (Social Determinants of Health) assessments and interventions performed: Yes   SDOH Screenings   Alcohol Screen: Not on file  Depression (PHQ2-9): Not on file  Financial Resource Strain: Low Risk    Difficulty of Paying Living Expenses: Not hard at all  Food Insecurity: Not on file  Housing: Not on file  Physical Activity: Not on file  Social Connections: Not on file  Stress: Not on file  Tobacco Use: Low Risk    Smoking Tobacco Use: Never   Smokeless Tobacco Use: Never  Transportation Needs: Not on file    [place]  No Known Allergies  Medications Reviewed Today     Reviewed by Freddrick March, PA-C (Physician Assistant Certified) on 70/78/67 at 1418  Med List Status: <None>   Medication Order Taking? Sig Documenting Provider Last Dose Status Informant  acetaminophen (TYLENOL) 500 MG tablet [id number] Yes Take 500 mg by mouth daily. [provider] Taking Active Self  amiodarone (PACERONE) 200 MG tablet [id number]   Take 1 tablet (200 mg total) by mouth daily. X 7 days, then decrease to 200 mg ( 1 tab) daily Barrett, Lodema Hong, PA-C  Active   aspirin EC 81 MG EC tablet [id number] Yes Take 1 tablet (81 mg total) by mouth daily. Swallow whole. Barrett, Lodema Hong, PA-C Taking Active   atorvastatin (LIPITOR) 20 MG tablet [id number] Yes Take 20 mg by mouth daily. [provider] Taking Active   Discontinued 06/17/21 1358   butalbital-acetaminophen-caffeine (FIORICET) 50-325-40 MG tablet [id number] Yes TAKE 1 TABLET BY MOUTH EVERY 4 HOURS AS NEEDED FOR HEADACHE Burns, Claudina Lick, MD Taking Active   calcium carbonate (OS-CAL - DOSED IN MG OF ELEMENTAL CALCIUM) 1250 (500 Ca) MG tablet [id number] Yes Take 1 tablet by mouth at bedtime. [provider] Taking Active Self  Cholecalciferol (VITAMIN D3) 1000 units CAPS [id number] Yes Take 1,000 Units by mouth at bedtime. [provider] Taking Active Self  cyanocobalamin 1000 MCG tablet [id number] Yes Take 1,000 mcg by mouth daily. [provider] Taking Active Self  furosemide (LASIX) 20 MG tablet [id number]  Take 1 tablet (20 mg total) by mouth daily as needed (for weight gain of 3 lbs or  more). Barrett, Lodema Hong, PA-C  Active   gabapentin (NEURONTIN) 100 MG capsule [id number] Yes Take 1 capsule (100 mg total) by mouth at bedtime. Sueanne Margarita, MD Taking Active Self  Discontinued 06/17/21 1357 (Completed Course)   Discontinued 06/17/21 1356   metoprolol tartrate (LOPRESSOR) 25 MG tablet [id number] Yes Take 0.5 tablets (12.5 mg total) by mouth 2 (two) times daily. Freddrick March, PA-C Taking Active   Discontinued 06/17/21 1400   Peppermint Oil (IBGARD) 90 MG CPCR [id number] Yes Take as directed Armbruster, Carlota Raspberry, MD Taking Active   potassium chloride SA (KLOR-CON) 20 MEQ tablet [id number]  1/2 tablet daily on days you take Lasix Barrett, Erin R, PA-C  Active   warfarin (COUMADIN) 1 MG tablet [id number] Yes Take 1 tablet (1 mg total) by mouth daily at 4  PM. Barrett, Lodema Hong, PA-C Taking Active             Patient Active Problem List   Diagnosis Date Noted   Long term (current) use of anticoagulants 05/29/2021   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   Mitral regurgitation 02/28/2021   Heart failure ([place]) 02/21/2021   Murmur 02/21/2021  Abnormal CT scan of lung 02/06/2021   Chest pain 02/05/2021   Cardiomegaly 02/05/2021   Left lower quadrant abdominal pain 05/01/2020   Acute post-traumatic headache, not intractable 11/15/2019   Prediabetes 05/02/2019   Carbuncle of groin 02/25/2019   Skin abnormalities 05/07/2018   Bilateral shoulder pain 12/11/2017   Gastritis 12/11/2017   Trigger finger, right little finger 09/11/2017   Numbness and tingling in left hand 07/20/2017   Dupuytren's contracture of hand 07/20/2017   CTS (carpal tunnel syndrome) 07/17/2017   Bilateral carpal tunnel syndrome 07/17/2017   Arthralgia 06/30/2017   Leg cramps 06/10/2017   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   Chronic midline low back pain without sciatica 09/15/2016   Osteopenia 07/20/2016   Bilateral finger numbness 06/03/2016   Joint pain 06/03/2016   Hyperlipidemia 02/09/2015   Bilateral primary osteoarthritis of knee 10/03/2013   Lateral meniscal tear 10/03/2013   Patellofemoral pain syndrome 10/03/2013   Varicose vein of leg    Allergic rhinitis    IBS (irritable bowel syndrome) 04/07/2011   Epigastric pain 12/26/2010   Diarrhea 12/25/2010   Migraine headache 09/27/2010   Essential hypertension 09/27/2010   GERD 09/27/2010   RENAL CALCULUS, HX OF 09/27/2010    Immunization History  Administered Date(s) Administered   Fluad Quad(high Dose 65+) 11/02/2019   Influenza Split 10/13/2011, 08/24/2012   Influenza Whole 09/27/2010   Influenza, High Dose Seasonal PF 10/02/2017   Influenza,inj,Quad PF,6+ Mos 08/01/2013, 10/30/2015   Influenza-Unspecified 09/22/2016   PFIZER(Purple Top)SARS-COV-2 Vaccination  02/03/2020, 02/28/2020   Pneumococcal Conjugate-13 04/16/2015   Pneumococcal Polysaccharide-23 01/14/2012   Tetanus 01/14/2012    Conditions to be addressed/monitored:  Hypertension, Hyperlipidemia, Atrial Fibrillation, Heart Failure, and Osteoporosis  Care Plan : [place]  Updates made by Charlton Haws, [place] since 06/18/2021 12:00 AM     Problem: Hypertension, Hyperlipidemia, Atrial Fibrillation, Heart Failure, and Osteoporosis   Priority: High     Long-Range Goal: Disease management   Start Date: 01/24/2021  Expected End Date: 01/24/2022  This Visit's Progress: On track  Recent Progress: On track  Priority: High  Note:   Current Barriers:  Unable to independently monitor therapeutic efficacy Suboptimal pharmacy services - lack of adherence packaging and delivery  Pharmacist Clinical Goal(s):  Over the next 180 days, patient will achieve adherence to monitoring guidelines and medication adherence to achieve therapeutic efficacy begin enhanced pharmacy services through collaboration with PharmD and provider.   Interventions: 1:1 collaboration with Binnie Rail, MD regarding development and update of comprehensive plan of care as evidenced by provider attestation and co-signature Inter-disciplinary care team collaboration (see longitudinal plan of care) Comprehensive medication review performed; medication list updated in electronic medical record  Hypertension / Heart Failure (BP goal < 130/80) Controlled - BP was somewhat overcontrolled recently and pt c/o fatigue; pt has not yet stopped taking losartan  Current regimen:  Metoprolol tartrate 25 mg - 1/2 tab BID Furosemide 20 mg PRN (3lb wt gain) Potassium 20 mEq PRN w/ lasix Interventions: Counseled on new PRN furosemide dosing, only take potassium if taking furosemide Counseled on new metoprolol formulation and dose (recent change from succinate to tartrate) Advised to stop losartan as instructed at CT  surgery f/u on 7/25   Hyperlipidemia (LDL goal < 100) Controlled - most recent LDL at goal, patient denies side effects Current regimen:  Atorvastatin 20 mg daily Aspirin 81 mg daily Interventions: Discussed cholesterol goals and benefits of medications for prevention of heart attack /  stroke Discussed importance of taking atorvastatin daily to receive the full benefit for risk reduction Recommend to continue current medication  Atrial Fibrillation (Goal: prevent stroke and major bleeding) -Controlled - pt reports compliance with medications as prescribed -New dx 04/2021 s/p MV repair. Responded well to amiodarone load. -CHADSVASC: 5 -Current treatment: Rate control: amiodarone 200 mg daily, metoprolol tartrate 25 mg - 1/2 tab BID Anticoagulation: warfarin 1 mg daily -Counseled on importance of adherence to anticoagulant exactly as prescribed; importance of regular laboratory monitoring; Vitamin K interaction with warfarin - advised consistent intake of Vitamin K foods is preferred, gave examples leafy greens (spinach, kale), broccoli  -Recommended to continue current medication   Osteoporosis Not Ideally Controlled - pt is overdue for Prolia injection (was due after 02/06/2021) -Last DEXA Scan: 06/2016 (previously treated with Fosamax 2004-2011)  T-Score femoral neck: -2.0  T-Score lumbar spine: -2.1  T-Score forearm radius: -3.0 Current regimen:  Calcium carbonate 600 mg daily Vitamin D 1000 IU 3 x per week Prolia 60 mg q6 months (last given 08/09/20) Interventions: Coordinate scheduling of next Prolia injection   Patient Goals/Self-Care Activities Patient will:  - take medications as prescribed -focus on medication adherence by pill packs -Stop taking losartan (as advised at CT surgery follow up) -Follow up with PCP for Prolia scheduling -Ensure consistent Vitamin K intake (leafy greens, broccoli)       Medication Assistance: [place]  packaging and delivery.  Compliance/Adherence/Medication fill history: Care Gaps: Shingrix Covid booster (due 07/30/20)  Star-Rating Drugs: Atorvastatin - LF 06/10/21 x 90 ds  Patient's preferred pharmacy is:  [facility] - [place], [place] - [address] [place] [address] Phone: [phone] Fax: [phone]  [id number] [place] [id number]: [phone]: [phone] Uses pill box? Yes Pt endorses 100% compliance  We discussed: Verbal consent obtained for [facility] enhanced pharmacy services (medication synchronization, adherence packaging, delivery coordination). A medication sync plan was created to allow patient to get all medications delivered once every 30 to 90 days per patient preference. Patient understands they have freedom to choose pharmacy and clinical pharmacist will coordinate care between all prescribers and [facility].  Patient decided to: Utilize [facility] for medication synchronization, packaging and delivery  Care Plan and Follow Up Patient Decision:  Patient agrees to Care Plan and Follow-up.  Plan: Telephone follow up appointment with care management team member scheduled for:  3 months  LCharlene Brooke PharmD, BWakemedClinical Pharmacist [phone]

## 2021-06-19 ENCOUNTER — Ambulatory Visit (INDEPENDENT_AMBULATORY_CARE_PROVIDER_SITE_OTHER): Payer: PPO

## 2021-06-19 ENCOUNTER — Other Ambulatory Visit: Payer: Self-pay

## 2021-06-19 ENCOUNTER — Encounter: Payer: Self-pay | Admitting: Cardiovascular Disease

## 2021-06-19 ENCOUNTER — Ambulatory Visit: Payer: PPO | Admitting: Cardiovascular Disease

## 2021-06-19 VITALS — BP 112/68 | HR 85 | Ht 63.0 in | Wt 134.4 lb

## 2021-06-19 DIAGNOSIS — I10 Essential (primary) hypertension: Secondary | ICD-10-CM | POA: Diagnosis not present

## 2021-06-19 DIAGNOSIS — I480 Paroxysmal atrial fibrillation: Secondary | ICD-10-CM | POA: Diagnosis not present

## 2021-06-19 DIAGNOSIS — Z9889 Other specified postprocedural states: Secondary | ICD-10-CM

## 2021-06-19 DIAGNOSIS — E782 Mixed hyperlipidemia: Secondary | ICD-10-CM

## 2021-06-19 DIAGNOSIS — Z7901 Long term (current) use of anticoagulants: Secondary | ICD-10-CM

## 2021-06-19 DIAGNOSIS — Z5181 Encounter for therapeutic drug level monitoring: Secondary | ICD-10-CM

## 2021-06-19 DIAGNOSIS — I340 Nonrheumatic mitral (valve) insufficiency: Secondary | ICD-10-CM

## 2021-06-19 HISTORY — DX: Paroxysmal atrial fibrillation: I48.0

## 2021-06-19 LAB — CBC
Hematocrit: 36.4 % (ref 34.0–46.6)
Hemoglobin: 11.8 g/dL (ref 11.1–15.9)
MCH: 33.1 pg — ABNORMAL HIGH (ref 26.6–33.0)
MCHC: 32.4 g/dL (ref 31.5–35.7)
MCV: 102 fL — ABNORMAL HIGH (ref 79–97)
Platelets: 265 10*3/uL (ref 150–450)
RBC: 3.57 x10E6/uL — ABNORMAL LOW (ref 3.77–5.28)
RDW: 12.5 % (ref 11.7–15.4)
WBC: 7.5 10*3/uL (ref 3.4–10.8)

## 2021-06-19 LAB — POCT INR: INR: 1.4 — AB (ref 2.0–3.0)

## 2021-06-19 NOTE — Assessment & Plan Note (Signed)
History of essential hypertension blood pressure measured today at 112/68.  She is on metoprolol.

## 2021-06-19 NOTE — Patient Instructions (Signed)
Take 2 tablets tonight only and then increase to 1.5 tablets Daily.   INR in 1 week. [phone]

## 2021-06-19 NOTE — Progress Notes (Signed)
06/19/2021 [name] [name]   June 16, 1946  [id number]  Primary Physician Quay Burow, Claudina Lick, MD Primary Cardiologist: Lorretta Harp MD Garret Reddish, [place], [place]  HPI:  [name] [name] is a 75 y.o.  [name] [name] is a  88 -year-old married Caucasian female mother of 3 children, grandmother to 2 grandchildren, patient Dr. Billey Gosling who saw Dr. Sallyanne Kuster remotely.  She is accompanied by her husband [name] today.  She was referred for atypical chest pain.  I last saw her in the office 03/20/2021.Marland Kitchen  Her cardiac risk factor profile is notable for treated hypertension and mild hyperlipidemia. She has never had a heart attack or stroke. She is otherwise healthy except for GERD. She doesn't saw Dr. Sallyanne Kuster back in 2011 for atypical chest pain and workup was negative including a Myoview stress test. She was recently out of the country for several months and returned one month ago. Since that time she's had daily chest pain. She was under a lot of stress and she was away the pain itself sounds like GERD, begin subxiphoid and has no other characteristic symptoms of angina.   Since I saw her 2 years ago she is complained of increasing dyspnea on exertion.  She did have a negative Myoview stress test.  Recent 2D echo revealed severe MR.    She underwent right and left heart cath by myself 03/14/2021 revealing normal coronary arteries with a high V wave.  She also underwent TEE at the same time revealing normal LV function with a flail posterior leaflet and severe MR.  She saw Dr. Roxy Manns at my request and underwent minimally invasive mitral valve repair on 05/21/2021 with a 38 mm Medtronic SIMUFORM  ring as well as left atrial appendage clipping.  She was discharged home on 05/26/2021.  She is recuperated nicely.  She was seen for postop visit at Dr. Guy Sandifer  office yesterday on only complaint of fatigue.   Current Meds  Medication Sig   acetaminophen (TYLENOL) 500 MG tablet Take 500 mg by mouth daily.    amiodarone (PACERONE) 200 MG tablet Take 1 tablet (200 mg total) by mouth daily. X 7 days, then decrease to 200 mg ( 1 tab) daily   aspirin EC 81 MG EC tablet Take 1 tablet (81 mg total) by mouth daily. Swallow whole.   atorvastatin (LIPITOR) 20 MG tablet Take 20 mg by mouth daily.   butalbital-acetaminophen-caffeine (FIORICET) 50-325-40 MG tablet TAKE 1 TABLET BY MOUTH EVERY 4 HOURS AS NEEDED FOR HEADACHE   calcium carbonate (OS-CAL - DOSED IN MG OF ELEMENTAL CALCIUM) 1250 (500 Ca) MG tablet Take 1 tablet by mouth at bedtime.   Cholecalciferol (VITAMIN D3) 1000 units CAPS Take 1,000 Units by mouth at bedtime.   cyanocobalamin 1000 MCG tablet Take 1,000 mcg by mouth daily.   furosemide (LASIX) 20 MG tablet Take 1 tablet (20 mg total) by mouth daily as needed (for weight gain of 3 lbs or  more).   gabapentin (NEURONTIN) 100 MG capsule Take 1 capsule (100 mg total) by mouth at bedtime. (Patient taking differently: Take 100 mg by mouth at bedtime. Patient takes 2 tablets)   metoprolol succinate (TOPROL-XL) 25 MG 24 hr tablet Take 25 mg by mouth 2 (two) times daily.   Peppermint Oil (IBGARD) 90 MG CPCR Take as directed   potassium chloride SA (KLOR-CON) 20 MEQ tablet 1/2 tablet daily on days you take Lasix   warfarin (COUMADIN) 1 MG tablet Take 1 tablet (1 mg  total) by mouth daily at 4 PM.     No Known Allergies  Social History   Socioeconomic History   Marital status: Married    Spouse name: [name]   Number of children: 3   Years of education: Not on file   Highest education level: Master's degree (e.g., MA, [name], MEng, MEd, MSW, MBA)  Occupational History   Occupation: retired  Tobacco Use   Smoking status: Never   Smokeless tobacco: Never  Vaping Use   Vaping Use: Never used  Substance and Sexual Activity   Alcohol use: No   Drug use: No   Sexual activity: Not on file  Other Topics Concern   Not on file  Social History Narrative   Married, lives with spouse. Retired Licensed conveyancer,  Print production planner. Has 3 kids (moved to [place] to be closer)- youngest son MD. Dorie Rank to [place] from [place] 06/2010, lived in [place] x 10years   Social Determinants of Health   Financial Resource Strain: Low Risk    Difficulty of Paying Living Expenses: Not hard at all  Food Insecurity: Not on file  Transportation Needs: Not on file  Physical Activity: Not on file  Stress: Not on file  Social Connections: Not on file  Intimate Partner Violence: Not on file     Review of Systems: General: negative for chills, fever, night sweats or weight changes.  Cardiovascular: negative for chest pain, dyspnea on exertion, edema, orthopnea, palpitations, paroxysmal nocturnal dyspnea or shortness of breath Dermatological: negative for rash Respiratory: negative for cough or wheezing Urologic: negative for hematuria Abdominal: negative for nausea, vomiting, diarrhea, bright red blood per rectum, melena, or hematemesis Neurologic: negative for visual changes, syncope, or dizziness All other systems reviewed and are otherwise negative except as noted above.    Blood pressure 112/68, pulse 85, height '5\' 3"'$  (1.6 m), weight 134 lb 6.4 oz (61 kg), SpO2 96 %.  General appearance: alert and no distress Neck: no adenopathy, no carotid bruit, no JVD, supple, symmetrical, trachea midline, and thyroid not enlarged, symmetric, no tenderness/mass/nodules Lungs: clear to auscultation bilaterally Heart: regular rate and rhythm, S1, S2 normal, no murmur, click, rub or gallop Extremities: extremities normal, atraumatic, no cyanosis or edema Pulses: 2+ and symmetric Skin: Skin color, texture, turgor normal. No rashes or lesions Neurologic: Grossly normal  EKG sinus rhythm at 86 with nonspecific ST and T wave changes.  I personally reviewed this EKG.  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  She underwent right and left  heart cath by myself revealing normal coronary arteries 03/14/2021 with elevated V wave.  She underwent minimally invasive mitral valve repair by Dr. Roxy Manns 05/21/2021 using a Medtronic SIMUFORM 30 mm ring.,  Left atrial appendage clipping as well.  She was discharged home on 7/3.  She only complains of being somewhat fatigued but otherwise denies shortness of breath or chest pain.  We will arrange for her to have a transthoracic echo.  Essential hypertension History of essential hypertension blood pressure measured today at 112/68.  She is on metoprolol.  Hyperlipidemia History of hyperlipidemia on statin therapy with lipid profile performed 02/19/2021 revealing total cholesterol 176, LDL of 96 and HDL of 49.  This is acceptable for primary prevention given the fact that her cardiac catheterization revealed normal coronary arteries.  Paroxysmal atrial fibrillation (HCC) History of PAF in the [place].  Maintaining sinus rhythm on amiodarone and Coumadin anticoagulation.  She  is in sinus rhythm today.  We will stop her amiodarone when I see her back in the office in 3 months most likely stop her Coumadin as well.     Lorretta Harp MD FACP,FACC,FAHA, [facility] 06/19/2021 10:12 AM

## 2021-06-19 NOTE — Patient Instructions (Signed)
  Lab Work:  Your physician recommends that you HAVE LAB WORK TODAY  If you have labs (blood work) drawn today and your tests are completely normal, you will receive your results only by: MyChart Message (if you have MyChart) OR A paper copy in the mail If you have any lab test that is abnormal or we need to change your treatment, we will call you to review the results.   Testing/Procedures:  Your physician has requested that you have an echocardiogram. Echocardiography is a painless test that uses sound waves to create images of your heart. It provides your doctor with information about the size and shape of your heart and how well your heart's chambers and valves are working. This procedure takes approximately one hour. There are no restrictions for this procedure. [place]   Follow-Up: At [facility], you and your health needs are our priority.  As part of our continuing mission to provide you with exceptional heart care, we have created designated Provider Care Teams.  These Care Teams include your primary Cardiologist (physician) and Advanced Practice Providers (APPs -  Physician Assistants and Nurse Practitioners) who all work together to provide you with the care you need, when you need it.  We recommend signing up for the patient portal called "MyChart".  Sign up information is provided on this After Visit Summary.  MyChart is used to connect with patients for Virtual Visits (Telemedicine).  Patients are able to view lab/test results, encounter notes, upcoming appointments, etc.  Non-urgent messages can be sent to your provider as well.   To learn more about what you can do with MyChart, go to [email or web address].    Your next appointment:   3 month(s)  The format for your next appointment:   In Person  Provider:   Quay Burow, MD

## 2021-06-19 NOTE — Assessment & Plan Note (Signed)
History of PAF in the [place].  Maintaining sinus rhythm on amiodarone and Coumadin anticoagulation.  She is in sinus rhythm today.  We will stop her amiodarone when I see her back in the office in 3 months most likely stop her Coumadin as well.

## 2021-06-19 NOTE — Assessment & Plan Note (Signed)
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  She underwent right and left heart cath by myself revealing normal coronary arteries 03/14/2021 with elevated V wave.  She underwent minimally invasive mitral valve repair by Dr. Roxy Manns 05/21/2021 using a Medtronic SIMUFORM 30 mm ring.,  Left atrial appendage clipping as well.  She was discharged home on 7/3.  She only complains of being somewhat fatigued but otherwise denies shortness of breath or chest pain.  We will arrange for her to have a transthoracic echo.

## 2021-06-19 NOTE — Assessment & Plan Note (Signed)
History of hyperlipidemia on statin therapy with lipid profile performed 02/19/2021 revealing total cholesterol 176, LDL of 96 and HDL of 49.  This is acceptable for primary prevention given the fact that her cardiac catheterization revealed normal coronary arteries.

## 2021-06-26 ENCOUNTER — Other Ambulatory Visit: Payer: Self-pay

## 2021-06-26 ENCOUNTER — Ambulatory Visit (INDEPENDENT_AMBULATORY_CARE_PROVIDER_SITE_OTHER): Payer: PPO

## 2021-06-26 DIAGNOSIS — Z5181 Encounter for therapeutic drug level monitoring: Secondary | ICD-10-CM

## 2021-06-26 DIAGNOSIS — Z9889 Other specified postprocedural states: Secondary | ICD-10-CM

## 2021-06-26 DIAGNOSIS — I340 Nonrheumatic mitral (valve) insufficiency: Secondary | ICD-10-CM

## 2021-06-26 DIAGNOSIS — Z7901 Long term (current) use of anticoagulants: Secondary | ICD-10-CM

## 2021-06-26 LAB — POCT INR: INR: 1.3 — AB (ref 2.0–3.0)

## 2021-06-26 NOTE — Patient Instructions (Signed)
Take 2 tablets tonight only and then increase to 1.5 tablets Daily, except 2 tablets on Monday, Wednesday and Friday.   INR in 1 week. [phone]

## 2021-06-30 ENCOUNTER — Encounter: Payer: Self-pay | Admitting: Internal Medicine

## 2021-07-01 ENCOUNTER — Other Ambulatory Visit: Payer: Self-pay

## 2021-07-01 DIAGNOSIS — M542 Cervicalgia: Secondary | ICD-10-CM

## 2021-07-01 NOTE — Progress Notes (Signed)
Per pt C-Spine X-Ray results. If pt agrees she will be referred to PT.  Per my chart message pt will like to be referred.

## 2021-07-03 ENCOUNTER — Ambulatory Visit (HOSPITAL_COMMUNITY): Payer: PPO | Attending: Cardiovascular Disease

## 2021-07-03 ENCOUNTER — Other Ambulatory Visit: Payer: Self-pay

## 2021-07-03 ENCOUNTER — Ambulatory Visit (INDEPENDENT_AMBULATORY_CARE_PROVIDER_SITE_OTHER): Payer: PPO

## 2021-07-03 DIAGNOSIS — I340 Nonrheumatic mitral (valve) insufficiency: Secondary | ICD-10-CM

## 2021-07-03 DIAGNOSIS — Z954 Presence of other heart-valve replacement: Secondary | ICD-10-CM | POA: Diagnosis not present

## 2021-07-03 DIAGNOSIS — Z9889 Other specified postprocedural states: Secondary | ICD-10-CM

## 2021-07-03 DIAGNOSIS — Z5181 Encounter for therapeutic drug level monitoring: Secondary | ICD-10-CM

## 2021-07-03 DIAGNOSIS — Z7901 Long term (current) use of anticoagulants: Secondary | ICD-10-CM

## 2021-07-03 LAB — ECHOCARDIOGRAM COMPLETE
Area-P 1/2: 2.02 cm2
MV VTI: 1.31 cm2
S' Lateral: 3.2 cm

## 2021-07-03 LAB — POCT INR: INR: 1.7 — AB (ref 2.0–3.0)

## 2021-07-03 MED ORDER — WARFARIN SODIUM 2 MG PO TABS: 2.0000 mg | ORAL_TABLET | Freq: Every day | ORAL | 1 refills | Status: DC

## 2021-07-03 MED ORDER — WARFARIN SODIUM 2 MG PO TABS: ORAL_TABLET | ORAL | 1 refills | Status: DC

## 2021-07-03 NOTE — Patient Instructions (Signed)
Increase to 1 tablet Daily, except 0.5 tablet on Wednesday.  INR in 3 weeks. [phone]

## 2021-07-05 ENCOUNTER — Other Ambulatory Visit: Payer: Self-pay | Admitting: *Deleted

## 2021-07-05 DIAGNOSIS — I340 Nonrheumatic mitral (valve) insufficiency: Secondary | ICD-10-CM

## 2021-07-08 ENCOUNTER — Encounter: Payer: Self-pay | Admitting: *Deleted

## 2021-07-09 ENCOUNTER — Other Ambulatory Visit (HOSPITAL_COMMUNITY): Payer: PPO

## 2021-07-10 ENCOUNTER — Encounter (HOSPITAL_COMMUNITY)
Admission: RE | Admit: 2021-07-10 | Discharge: 2021-07-10 | Disposition: A | Discharge status: Home / Self Care | Payer: PPO | Source: Ambulatory Visit | Attending: Cardiovascular Disease | Admitting: Cardiovascular Disease

## 2021-07-10 ENCOUNTER — Telehealth (HOSPITAL_COMMUNITY): Payer: Self-pay

## 2021-07-10 ENCOUNTER — Other Ambulatory Visit: Payer: Self-pay

## 2021-07-10 DIAGNOSIS — Z9889 Other specified postprocedural states: Secondary | ICD-10-CM | POA: Insufficient documentation

## 2021-07-10 DIAGNOSIS — I340 Nonrheumatic mitral (valve) insufficiency: Secondary | ICD-10-CM | POA: Insufficient documentation

## 2021-07-10 DIAGNOSIS — Z95818 Presence of other cardiac implants and grafts: Secondary | ICD-10-CM | POA: Insufficient documentation

## 2021-07-10 DIAGNOSIS — Z48812 Encounter for surgical aftercare following surgery on the circulatory system: Secondary | ICD-10-CM | POA: Insufficient documentation

## 2021-07-10 NOTE — Telephone Encounter (Signed)
Called patient to see if she was interested in participating in the Cardiac Rehab Program. Patient stated yes. Patient will come in for orientation on 08/15/2021'@9'$ :00am and will attend the 9:15am exercise class.   Tourist information centre manager.

## 2021-07-10 NOTE — Progress Notes (Signed)
Cardiac Rehab Medication Review   Does the patient  feel that his/her medications are working for him/her?  yes  Has the patient been experiencing any side effects to the medications prescribed?  no  Does the patient measure his/her own blood pressure or blood glucose at home?  yes Pt has bp cuff and checks her bp daily  Does the patient have any problems obtaining medications due to transportation or finances?   no  Understanding of regimen: excellent Understanding of indications: excellent Potential of compliance: excellent     Clarified with pt who had her bottles out that she is taking metoprolol tartrate half a  25 mg tablet twice a day.  She is no longer taking metoprolol succinate. Both medications are listed on her medication sheet.     [name] [name] Ruben Im RN 07/10/2021 2:26 PM

## 2021-07-11 ENCOUNTER — Encounter (HOSPITAL_COMMUNITY)
Admission: RE | Admit: 2021-07-11 | Discharge: 2021-07-11 | Disposition: A | Discharge status: Home / Self Care | Payer: PPO | Source: Ambulatory Visit | Attending: Cardiovascular Disease | Admitting: Cardiovascular Disease

## 2021-07-11 ENCOUNTER — Encounter (HOSPITAL_COMMUNITY): Payer: Self-pay

## 2021-07-11 VITALS — BP 114/76 | HR 94 | Ht 61.25 in | Wt 133.8 lb

## 2021-07-11 DIAGNOSIS — Z9889 Other specified postprocedural states: Secondary | ICD-10-CM

## 2021-07-11 NOTE — Progress Notes (Signed)
Cardiac Individual Treatment Plan  Patient Details  Name: [name] [name] MRN: [id number] Date of Birth: 1946/03/16 Referring Provider:   Flowsheet Row CARDIAC REHAB PHASE II ORIENTATION from 07/11/2021 in [facility]  Referring Provider Quay Burow, MD       Initial Encounter Date:  [place] PHASE II ORIENTATION from 07/11/2021 in [facility]  Date 07/11/21       Visit Diagnosis: 05/21/21 S/P mitral valve repair, Minimally invasive  Patient's Home Medications on Admission:  Current Outpatient Medications:    acetaminophen (TYLENOL) 500 MG tablet, Take 500 mg by mouth daily., Disp: , Rfl:    amiodarone (PACERONE) 200 MG tablet, Take 1 tablet (200 mg total) by mouth daily. X 7 days, then decrease to 200 mg ( 1 tab) daily, Disp: 60 tablet, Rfl: 1   aspirin EC 81 MG EC tablet, Take 1 tablet (81 mg total) by mouth daily. Swallow whole., Disp: 30 tablet, Rfl: 11   atorvastatin (LIPITOR) 20 MG tablet, Take 20 mg by mouth daily., Disp: , Rfl:    butalbital-acetaminophen-caffeine (FIORICET) 50-325-40 MG tablet, TAKE 1 TABLET BY MOUTH EVERY 4 HOURS AS NEEDED FOR HEADACHE, Disp: 30 tablet, Rfl: 0   calcium carbonate (OS-CAL - DOSED IN MG OF ELEMENTAL CALCIUM) 1250 (500 Ca) MG tablet, Take 1 tablet by mouth at bedtime., Disp: , Rfl:    Cholecalciferol (VITAMIN D3) 1000 units CAPS, Take 1,000 Units by mouth at bedtime., Disp: , Rfl:    cyanocobalamin 1000 MCG tablet, Take 1,000 mcg by mouth daily., Disp: , Rfl:    furosemide (LASIX) 20 MG tablet, Take 1 tablet (20 mg total) by mouth daily as needed (for weight gain of 3 lbs or  more)., Disp: 30 tablet, Rfl:    gabapentin (NEURONTIN) 100 MG capsule, Take 1 capsule (100 mg total) by mouth at bedtime. (Patient taking differently: Take 100 mg by mouth at bedtime. Patient takes 2 tablets), Disp: , Rfl:    metoprolol tartrate (LOPRESSOR) 25 MG tablet, Take 0.5 tablets  (12.5 mg total) by mouth 2 (two) times daily., Disp: 60 tablet, Rfl: 3   potassium chloride SA (KLOR-CON) 20 MEQ tablet, 1/2 tablet daily on days you take Lasix, Disp: , Rfl:    warfarin (COUMADIN) 2 MG tablet, Take 1-2 tablets Daily or as prescribed by Clinic, Disp: 60 tablet, Rfl: 1   metoprolol succinate (TOPROL-XL) 25 MG 24 hr tablet, Take 25 mg by mouth 2 (two) times daily. (Patient not taking: Reported on 07/10/2021), Disp: , Rfl:    Peppermint Oil (IBGARD) 90 MG CPCR, Take as directed (Patient not taking: Reported on 07/10/2021), Disp: 16 capsule, Rfl: 0  Past Medical History: Past Medical History:  Diagnosis Date   Arthritis    Atypical chest pain    Diverticulosis    Gastritis 11/08/2007, 04/12/2012   H pylori bx neg 03/2012   GERD (gastroesophageal reflux disease) 11/08/2007, 04/12/2012   Hepatic cyst    Hiatal hernia 11/08/2007, 04/12/2012   History of kidney stones    Hyperlipidemia    Hypertension    IBS (irritable bowel syndrome)    diarrhea predom   Migraine    Mitral regurgitation    Osteoporosis    DEXA 01/21/2012: -2.1 spine and R fem, -1.8 L fem s/p fosamax  2004-2011 DEXA 04/18/14 @ [facility]: -2.5 - rec to start Prolia     PVC's (premature ventricular contractions)    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  Complex valvuloplasty including PTFE neochord placement x6 with 30 mm Medtronic Simuform ring annuloplasty with clipping of LA appendage   Schatzki's ring 11/11/2010   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   Shingles outbreak 04/2013    Tobacco Use: Social History   Tobacco Use  Smoking Status Never  Smokeless Tobacco Never    Labs: Recent Review Flowsheet Data     Labs for ITP Cardiac and Pulmonary Rehab Latest Ref Rng & Units 05/21/2021 05/21/2021 05/21/2021 05/22/2021 05/23/2021   Cholestrol 0 - 200 mg/dL - - - - -   LDLCALC 0 - 99 mg/dL - - - - -   LDLDIRECT mg/dL - - - - -   HDL >39.00 mg/dL - - - - -   Trlycerides 0.0 - 149.0 mg/dL - - - - -    Hemoglobin A1c 4.6 - 6.5 % - - - - -   PHART 7.350 - 7.450 - 7.423 7.313(L) - -   PCO2ART 32.0 - 48.0 mmHg - 32.7 46.3 - -   HCO3 20.0 - 28.0 mmol/L - 21.3 24.0 - -   TCO2 22 - 32 mmol/L '22 22 25 '$ - -   ACIDBASEDEF 0.0 - 2.0 mmol/L - 3.0(H) 3.0(H) - -   O2SAT % - 99.0 98.0 67.4 62.6       Capillary Blood Glucose: Lab Results  Component Value Date   GLUCAP 129 (H) 05/23/2021   GLUCAP 171 (H) 05/23/2021   GLUCAP 111 (H) 05/23/2021   GLUCAP 108 (H) 05/22/2021   GLUCAP 203 (H) 05/22/2021     Exercise Target Goals: Exercise Program Goal: Individual exercise prescription set using results from initial 6 min walk test and THRR while considering  patient's activity barriers and safety.   Exercise Prescription Goal: Starting with aerobic activity 30 plus minutes a day, 3 days per week for initial exercise prescription. Provide home exercise prescription and guidelines that participant acknowledges understanding prior to discharge.  Activity Barriers & Risk Stratification:  Activity Barriers & Cardiac Risk Stratification - 07/11/21 0925       Activity Barriers & Cardiac Risk Stratification   Activity Barriers Arthritis;Joint Problems;Balance Concerns    Cardiac Risk Stratification Moderate             6 Minute Walk:  6 Minute Walk     Row Name 07/11/21 0835         6 Minute Walk   Phase Initial     Distance 1347 feet     Walk Time 6 minutes     # of Rest Breaks 0     MPH 2.55     METS 2.68     RPE 9     Perceived Dyspnea  0     VO2 Peak 9.38     Symptoms No     Resting HR 98 bpm     Resting BP 114/76     Resting Oxygen Saturation  97 %     Exercise Oxygen Saturation  during 6 min walk 96 %     Max Ex. HR 103 bpm     Max Ex. BP 130/78     2 Minute Post BP 120/80              Oxygen Initial Assessment:   Oxygen Re-Evaluation:   Oxygen Discharge (Final Oxygen Re-Evaluation):   Initial Exercise Prescription:  Initial Exercise Prescription -  07/11/21 0900       Date of Initial Exercise RX and Referring Provider  Date 07/11/21    Referring Provider Quay Burow, MD    Expected Discharge Date 09/06/21      NuStep   Level 2    SPM 85    Minutes 15    METs 2      Track   Laps 14    Minutes 15    METs 2.62      Prescription Details   Frequency (times per week) 3    Duration Progress to 30 minutes of continuous aerobic without signs/symptoms of physical distress      Intensity   THRR 40-80% of Max Heartrate 58-115    Ratings of Perceived Exertion 11-13    Perceived Dyspnea 0-4      Progression   Progression Continue progressive overload as per policy without signs/symptoms or physical distress.      Resistance Training   Training Prescription Yes    Weight 2 lbs    Reps 10-15             Perform Capillary Blood Glucose checks as needed.  Exercise Prescription Changes:   Exercise Comments:   Exercise Goals and Review:   Exercise Goals     Row Name 07/11/21 0927             Exercise Goals   Increase Physical Activity Yes       Intervention Provide advice, education, support and counseling about physical activity/exercise needs.;Develop an individualized exercise prescription for aerobic and resistive training based on initial evaluation findings, risk stratification, comorbidities and participant's personal goals.       Expected Outcomes Short Term: Attend rehab on a regular basis to increase amount of physical activity.;Long Term: Add in home exercise to make exercise part of routine and to increase amount of physical activity.;Long Term: Exercising regularly at least 3-5 days a week.       Increase Strength and Stamina Yes       Intervention Provide advice, education, support and counseling about physical activity/exercise needs.;Develop an individualized exercise prescription for aerobic and resistive training based on initial evaluation findings, risk stratification, comorbidities and  participant's personal goals.       Expected Outcomes Short Term: Increase workloads from initial exercise prescription for resistance, speed, and METs.;Short Term: Perform resistance training exercises routinely during rehab and add in resistance training at home;Long Term: Improve cardiorespiratory fitness, muscular endurance and strength as measured by increased METs and functional capacity (6MWT)       Able to understand and use rate of perceived exertion (RPE) scale Yes       Intervention Provide education and explanation on how to use RPE scale       Expected Outcomes Short Term: Able to use RPE daily in rehab to express subjective intensity level;Long Term:  Able to use RPE to guide intensity level when exercising independently       Knowledge and understanding of Target Heart Rate Range (THRR) Yes       Intervention Provide education and explanation of THRR including how the numbers were predicted and where they are located for reference       Expected Outcomes Short Term: Able to state/look up THRR;Short Term: Able to use daily as guideline for intensity in rehab;Long Term: Able to use THRR to govern intensity when exercising independently       Understanding of Exercise Prescription Yes       Intervention Provide education, explanation, and written materials on patient's individual exercise prescription  Expected Outcomes Short Term: Able to explain program exercise prescription;Long Term: Able to explain home exercise prescription to exercise independently                Exercise Goals Re-Evaluation :    Discharge Exercise Prescription (Final Exercise Prescription Changes):   Nutrition:  Target Goals: Understanding of nutrition guidelines, daily intake of sodium '1500mg'$ , cholesterol '200mg'$ , calories 30% from fat and 7% or less from saturated fats, daily to have 5 or more servings of fruits and vegetables.  Biometrics:  Pre Biometrics - 07/11/21 0800       Pre  Biometrics   Waist Circumference 35 inches    Hip Circumference 40.25 inches    Waist to Hip Ratio 0.87 %    Triceps Skinfold 30 mm    % Body Fat 39.1 %    Grip Strength 21 kg    Flexibility 12.75 in    Single Leg Stand 12.68 seconds              Nutrition Therapy Plan and Nutrition Goals:   Nutrition Assessments:  MEDIFICTS Score Key: ?70 Need to make dietary changes  40-70 Heart Healthy Diet ? 40 Therapeutic Level Cholesterol Diet   Picture Your Plate Scores: D34-534 Unhealthy dietary pattern with much room for improvement. 41-50 Dietary pattern unlikely to meet recommendations for good health and room for improvement. 51-60 More healthful dietary pattern, with some room for improvement.  >60 Healthy dietary pattern, although there may be some specific behaviors that could be improved.    Nutrition Goals Re-Evaluation:   Nutrition Goals Discharge (Final Nutrition Goals Re-Evaluation):   Psychosocial: Target Goals: Acknowledge presence or absence of significant depression and/or stress, maximize coping skills, provide positive support system. Participant is able to verbalize types and ability to use techniques and skills needed for reducing stress and depression.  Initial Review & Psychosocial Screening:  Initial Psych Review & Screening - 07/11/21 0832       Initial Review   Current issues with None Identified      Family Dynamics   Good Support System? Yes   Raidyn has her husband and children for support     Barriers   Psychosocial barriers to participate in program There are no identifiable barriers or psychosocial needs.      Screening Interventions   Interventions Encouraged to exercise             Quality of Life Scores:  Quality of Life - 07/11/21 0940       Quality of Life   Select Quality of Life      Quality of Life Scores   Health/Function Pre 21.13 %    Socioeconomic Pre 23.92 %    Psych/Spiritual Pre 24.43 %    Family Pre 27.5 %     GLOBAL Pre 23.3 %            Scores of 19 and below usually indicate a poorer quality of life in these areas.  A difference of  2-3 points is a clinically meaningful difference.  A difference of 2-3 points in the total score of the Quality of Life Index has been associated with significant improvement in overall quality of life, self-image, physical symptoms, and general health in studies assessing change in quality of life.  PHQ-9: Recent Review Flowsheet Data     Depression screen [facility] [address] 2/9 07/11/2021 07/11/2021 04/24/2020 05/02/2019 07/20/2017   Decreased Interest 0 0 0 0 0   Down, Depressed, Hopeless 0 0  0 0 0   PHQ - 2 Score 0 0 0 0 0      Interpretation of Total Score  Total Score Depression Severity:  1-4 = Minimal depression, 5-9 = Mild depression, 10-14 = Moderate depression, 15-19 = Moderately severe depression, 20-27 = Severe depression   Psychosocial Evaluation and Intervention:   Psychosocial Re-Evaluation:   Psychosocial Discharge (Final Psychosocial Re-Evaluation):   Vocational Rehabilitation: Provide vocational rehab assistance to qualifying candidates.   Vocational Rehab Evaluation & Intervention:  Vocational Rehab - 07/11/21 [id number]       Initial Vocational Rehab Evaluation & Intervention   Assessment shows need for Vocational Rehabilitation No   Ferrin is retired and does not need vocational rehab at this time            Education: Education Goals: Education classes will be provided on a weekly basis, covering required topics. Participant will state understanding/return demonstration of topics presented.  Learning Barriers/Preferences:  Learning Barriers/Preferences - 07/11/21 [id number]       Learning Barriers/Preferences   Learning Barriers Sight   wears glasses   Learning Preferences Written Material;Computer/Internet;Pictoral;Video             Education Topics: Hypertension, Hypertension Reduction -Define heart disease and high blood  pressure. Discus how high blood pressure affects the body and ways to reduce high blood pressure.   Exercise and Your Heart -Discuss why it is important to exercise, the FITT principles of exercise, normal and abnormal responses to exercise, and how to exercise safely.   Angina -Discuss definition of angina, causes of angina, treatment of angina, and how to decrease risk of having angina.   Cardiac Medications -Review what the following cardiac medications are used for, how they affect the body, and side effects that may occur when taking the medications.  Medications include Aspirin, Beta blockers, calcium channel blockers, ACE Inhibitors, angiotensin receptor blockers, diuretics, digoxin, and antihyperlipidemics.   Congestive Heart Failure -Discuss the definition of CHF, how to live with CHF, the signs and symptoms of CHF, and how keep track of weight and sodium intake.   Heart Disease and Intimacy -Discus the effect sexual activity has on the heart, how changes occur during intimacy as we age, and safety during sexual activity.   Smoking Cessation / COPD -Discuss different methods to quit smoking, the health benefits of quitting smoking, and the definition of COPD.   Nutrition I: Fats -Discuss the types of cholesterol, what cholesterol does to the heart, and how cholesterol levels can be controlled.   Nutrition II: Labels -Discuss the different components of food labels and how to read food label   Heart Parts/Heart Disease and PAD -Discuss the anatomy of the heart, the pathway of blood circulation through the heart, and these are affected by heart disease.   Stress I: Signs and Symptoms -Discuss the causes of stress, how stress may lead to anxiety and depression, and ways to limit stress.   Stress II: Relaxation -Discuss different types of relaxation techniques to limit stress.   Warning Signs of Stroke / TIA -Discuss definition of a stroke, what the signs and  symptoms are of a stroke, and how to identify when someone is having stroke.   Knowledge Questionnaire Score:  Knowledge Questionnaire Score - 07/11/21 0941       Knowledge Questionnaire Score   Pre Score 19/24             Core Components/Risk Factors/Patient Goals at Admission:  Personal Goals and Risk Factors at  Admission - 07/11/21 0929       Core Components/Risk Factors/Patient Goals on Admission    Weight Management Weight Maintenance;Yes    Intervention Weight Management: Develop a combined nutrition and exercise program designed to reach desired caloric intake, while maintaining appropriate intake of nutrient and fiber, sodium and fats, and appropriate energy expenditure required for the weight goal.;Weight Management: Provide education and appropriate resources to help participant work on and attain dietary goals.    Admit Weight 133 lb 13.1 oz (60.7 kg)    Expected Outcomes Weight Maintenance: Understanding of the daily nutrition guidelines, which includes 25-35% calories from fat, 7% or less cal from saturated fats, less than '200mg'$  cholesterol, less than 1.5gm of sodium, & 5 or more servings of fruits and vegetables daily;Understanding of distribution of calorie intake throughout the day with the consumption of 4-5 meals/snacks;Understanding recommendations for meals to include 15-35% energy as protein, 25-35% energy from fat, 35-60% energy from carbohydrates, less than '200mg'$  of dietary cholesterol, 20-35 gm of total fiber daily    Hypertension Yes    Intervention Provide education on lifestyle modifcations including regular physical activity/exercise, weight management, moderate sodium restriction and increased consumption of fresh fruit, vegetables, and low fat dairy, alcohol moderation, and smoking cessation.;Monitor prescription use compliance.    Expected Outcomes Short Term: Continued assessment and intervention until BP is < 140/4m HG in hypertensive participants. <  130/868mHG in hypertensive participants with diabetes, heart failure or chronic kidney disease.;Long Term: Maintenance of blood pressure at goal levels.    Lipids Yes    Intervention Provide education and support for participant on nutrition & aerobic/resistive exercise along with prescribed medications to achieve LDL '70mg'$ , HDL >'40mg'$ .    Expected Outcomes Short Term: Participant states understanding of desired cholesterol values and is compliant with medications prescribed. Participant is following exercise prescription and nutrition guidelines.;Long Term: Cholesterol controlled with medications as prescribed, with individualized exercise RX and with personalized nutrition plan. Value goals: LDL < '70mg'$ , HDL > 40 mg.             Core Components/Risk Factors/Patient Goals Review:    Core Components/Risk Factors/Patient Goals at Discharge (Final Review):    ITP Comments:  ITP Comments     Row Name 07/11/21 0830           ITP Comments Dr TrFransico HimD, Medical Director                Comments:Dovey attended orientation on 07/11/2021 to review rules and guidelines for program.  Completed 6 minute walk test, Intitial ITP, and exercise prescription.  VSS. Telemetry-Sinus Rhythm.  Asymptomatic. Safety measures and social distancing in place per CDC guidelines.MaBarnet PallRN,BSN 07/11/2021 11:03 AM

## 2021-07-12 ENCOUNTER — Other Ambulatory Visit: Payer: Self-pay | Admitting: Physician Assistant

## 2021-07-15 ENCOUNTER — Other Ambulatory Visit: Payer: Self-pay

## 2021-07-15 ENCOUNTER — Encounter (HOSPITAL_COMMUNITY)
Admission: RE | Admit: 2021-07-15 | Discharge: 2021-07-15 | Disposition: A | Discharge status: Home / Self Care | Payer: PPO | Source: Ambulatory Visit | Attending: Cardiovascular Disease | Admitting: Cardiovascular Disease

## 2021-07-15 DIAGNOSIS — Z9889 Other specified postprocedural states: Secondary | ICD-10-CM | POA: Diagnosis not present

## 2021-07-15 DIAGNOSIS — Z95818 Presence of other cardiac implants and grafts: Secondary | ICD-10-CM | POA: Diagnosis not present

## 2021-07-15 DIAGNOSIS — Z48812 Encounter for surgical aftercare following surgery on the circulatory system: Secondary | ICD-10-CM | POA: Diagnosis not present

## 2021-07-15 DIAGNOSIS — I340 Nonrheumatic mitral (valve) insufficiency: Secondary | ICD-10-CM | POA: Diagnosis not present

## 2021-07-15 NOTE — Progress Notes (Signed)
Daily Session Note  Patient Details  Name: JACK MINEAU MRN: [id number] Date of Birth: 08/14/1946 Referring Provider:   Flowsheet Row CARDIAC REHAB PHASE II ORIENTATION from 07/11/2021 in [facility]  Referring Provider Quay Burow, MD       Encounter Date: 07/15/2021  Check In:  Session Check In - 07/15/21 0911       Check-In   Supervising physician immediately available to respond to emergencies [facility] immediately available    Physician(s) Dr. Mal Misty    Location [facility]    Staff Present Barnet Pall, RN, Milus Glazier, MS, ACSM-CEP, CCRP, Exercise Physiologist;Jetta Gilford Rile BS, ACSM EP-C, Exercise Physiologist;Olinty Celesta Aver, MS, ACSM CEP, Exercise Physiologist;Kaylee Rosana Hoes, MS, ACSM-CEP, Exercise Physiologist    Virtual Visit No    Medication changes reported     No    Fall or balance concerns reported    No    Tobacco Cessation No Change    Current number of cigarettes/nicotine per day     0    Warm-up and Cool-down Performed on first and last piece of equipment    Resistance Training Performed Yes    VAD Patient? No    PAD/SET Patient? No      Pain Assessment   Currently in Pain? Yes    Pain Score 4     Pain Location Knee    Pain Orientation Left    Pain Type Chronic pain    Multiple Pain Sites No             Capillary Blood Glucose: No results found for this or any previous visit (from the past 24 hour(s)).   Exercise Prescription Changes - 07/15/21 1000       Response to Exercise   Blood Pressure (Admit) 116/64    Blood Pressure (Exercise) 124/78    Blood Pressure (Exit) 124/80    Heart Rate (Admit) 77 bpm    Heart Rate (Exercise) 84 bpm    Heart Rate (Exit) 80 bpm    Rating of Perceived Exertion (Exercise) 11    Symptoms Left knee pain, chronic    Comments Pt's first day in the [facility] program    Duration Continue with 30 min of aerobic exercise without signs/symptoms of  physical distress.    Intensity THRR unchanged      Progression   Progression Continue to progress workloads to maintain intensity without signs/symptoms of physical distress.    Average METs 2.25      Resistance Training   Training Prescription Yes    Weight 2 lbs    Reps 10-15    Time 10 Minutes      Interval Training   Interval Training No      NuStep   Level 2    SPM 85    Minutes 15    METs 2      Track   Laps 13    Minutes 15    METs 2.51             Social History   Tobacco Use  Smoking Status Never  Smokeless Tobacco Never    Goals Met:  Exercise tolerated well No report of cardiac concerns or symptoms Strength training completed today  Goals Unmet:  Not Applicable  Comments: Joelee started cardiac rehab today.  Pt tolerated light exercise without difficulty. VSS, telemetry-Sinus Rhythm, asymptomatic.  Medication list reconciled. Pt denies barriers to medicaiton compliance.  PSYCHOSOCIAL ASSESSMENT:  PHQ-0. Pt exhibits positive  coping skills, hopeful outlook with supportive family. No psychosocial needs identified at this time, no psychosocial interventions necessary.    Pt enjoys spending time with her family.   Pt oriented to exercise equipment and routine.    Understanding verbalized. Barnet Pall, RN,BSN 07/15/2021 11:02 AM    Dr. Fransico Him is Medical Director for Cardiac Rehab at [facility].

## 2021-07-17 ENCOUNTER — Encounter (HOSPITAL_COMMUNITY)
Admission: RE | Admit: 2021-07-17 | Discharge: 2021-07-17 | Disposition: A | Discharge status: Home / Self Care | Payer: PPO | Source: Ambulatory Visit | Attending: Cardiovascular Disease | Admitting: Cardiovascular Disease

## 2021-07-17 ENCOUNTER — Other Ambulatory Visit: Payer: Self-pay

## 2021-07-17 ENCOUNTER — Encounter: Payer: Self-pay | Admitting: Surgery

## 2021-07-17 ENCOUNTER — Ambulatory Visit (INDEPENDENT_AMBULATORY_CARE_PROVIDER_SITE_OTHER): Payer: Self-pay | Admitting: Surgery

## 2021-07-17 VITALS — BP 114/69 | HR 88 | Resp 20 | Ht 61.0 in | Wt 134.0 lb

## 2021-07-17 DIAGNOSIS — Z9889 Other specified postprocedural states: Secondary | ICD-10-CM

## 2021-07-17 DIAGNOSIS — Z48812 Encounter for surgical aftercare following surgery on the circulatory system: Secondary | ICD-10-CM | POA: Diagnosis not present

## 2021-07-17 NOTE — Progress Notes (Signed)
HPI: Patient returns for routine postoperative follow-up having undergone minimally invasive mitral valve repair by Dr.Owen on 05/21/2021. The patient's early postoperative recovery while in the hospital was notable for development of postoperative atrial fibrillation converted with amiodarone.  She was discharged on Coumadin. Since hospital discharge the patient reports that she has been feeling well.  She is walking daily without chest pain or shortness of breath.  Her stamina continues to improve.   Current Outpatient Medications  Medication Sig Dispense Refill   acetaminophen (TYLENOL) 500 MG tablet Take 500 mg by mouth daily.     amiodarone (PACERONE) 200 MG tablet Take 1 tablet (200 mg total) by mouth daily. X 7 days, then decrease to 200 mg ( 1 tab) daily 60 tablet 1   aspirin EC 81 MG EC tablet Take 1 tablet (81 mg total) by mouth daily. Swallow whole. 30 tablet 11   atorvastatin (LIPITOR) 20 MG tablet Take 20 mg by mouth daily.     calcium carbonate (OS-CAL - DOSED IN MG OF ELEMENTAL CALCIUM) 1250 (500 Ca) MG tablet Take 1 tablet by mouth at bedtime.     Cholecalciferol (VITAMIN D3) 1000 units CAPS Take 1,000 Units by mouth at bedtime.     cyanocobalamin 1000 MCG tablet Take 1,000 mcg by mouth daily.     furosemide (LASIX) 20 MG tablet Take 1 tablet (20 mg total) by mouth daily as needed (for weight gain of 3 lbs or  more). 30 tablet    gabapentin (NEURONTIN) 100 MG capsule Take 1 capsule (100 mg total) by mouth at bedtime. (Patient taking differently: Take 100 mg by mouth at bedtime. Patient takes 2 tablets)     metoprolol tartrate (LOPRESSOR) 25 MG tablet Take 0.5 tablets (12.5 mg total) by mouth 2 (two) times daily. 60 tablet 3   Peppermint Oil (IBGARD) 90 MG CPCR Take as directed 16 capsule 0   potassium chloride SA (KLOR-CON) 20 MEQ tablet 1/2 tablet daily on days you take Lasix     warfarin (COUMADIN) 2 MG tablet Take 1-2 tablets Daily or as prescribed by Clinic 60 tablet 1    butalbital-acetaminophen-caffeine (FIORICET) 50-325-40 MG tablet TAKE 1 TABLET BY MOUTH EVERY 4 HOURS AS NEEDED FOR HEADACHE (Patient not taking: Reported on 07/17/2021) 30 tablet 0   metoprolol succinate (TOPROL-XL) 25 MG 24 hr tablet Take 25 mg by mouth 2 (two) times daily. (Patient not taking: Reported on 07/17/2021)     No current facility-administered medications for this visit.    Physical Exam: BP 114/69 (BP Location: Right Arm, Patient Position: Sitting)   Pulse 88   Resp 20   Ht '5\' 1"'$  (1.549 m)   Wt 134 lb (60.8 kg)   SpO2 94% Comment: RA  BMI 25.32 kg/m  She looks well. Cardiac exam shows a regular rate and rhythm with normal heart sounds.  There is no murmur. Lungs are clear. The right chest incision is well-healed. There is no peripheral edema.  Diagnostic Tests:  ECHOCARDIOGRAM REPORT         Patient Name:   [name] [name] Date of Exam: 07/03/2021  Medical Rec #:  [id number]          Height:       63.0 in  Accession #:    [id number]         Weight:       134.4 lb  Date of Birth:  [birth year]          BSA:  1.633 m  Patient Age:    75 years           BP:           112/68 mmHg  Patient Gender: F                  HR:           81 bpm.  Exam Location:  [address]   Procedure: 2D Echo, Cardiac Doppler and Color Doppler   Indications:    I05.9 Mitral Valve Disorder     History:        Patient has prior history of Echocardiogram examinations,  most                  recent 05/21/2021. Mitral Valve Disease, Arrythmias:PVC;  Risk                  Factors:Hypertension, Family History of Coronary Artery  Disease                  and Dyslipidemia. Status post Mitral Valve Repair  (05-21-21, 62m                  Medrtonic Simuform Ring).     Sonographer:    BDeliah BostonRDCS  Referring Phys: [email or web address]    1. Left ventricular ejection fraction, by estimation, is 40 to 45%. The  left ventricle has mildly decreased function.  The left ventricle  demonstrates global hypokinesis. Left ventricular diastolic function could  not be evaluated.   2. Right ventricular systolic function is normal. The right ventricular  size is normal.   3. The mitral valve has been repaired/replaced. No evidence of mitral  valve regurgitation. No evidence of mitral stenosis. The mean mitral valve  gradient is 3.0 mmHg with average heart rate of 77 bpm. Procedure Date:  05/21/2021.   4. The aortic valve is tricuspid. Aortic valve regurgitation is not  visualized. No aortic stenosis is present.   5. The inferior vena cava is normal in size with greater than 50%  respiratory variability, suggesting right atrial pressure of 3 mmHg.   Comparison(s): Prior images reviewed side by side. The left ventricular  function is worsened.   FINDINGS   Left Ventricle: Left ventricular ejection fraction, by estimation, is 40  to 45%. The left ventricle has mildly decreased function. The left  ventricle demonstrates global hypokinesis. The left ventricular internal  cavity size was normal in size. There is   no left ventricular hypertrophy. Abnormal (paradoxical) septal motion  consistent with post-operative status. Left ventricular diastolic function  could not be evaluated due to mitral valve repair. Left ventricular  diastolic function could not be  evaluated.   Right Ventricle: The right ventricular size is normal. No increase in  right ventricular wall thickness. Right ventricular systolic function is  normal.   Left Atrium: Left atrial size was normal in size.   Right Atrium: Right atrial size was normal in size.   Pericardium: There is no evidence of pericardial effusion.   Mitral Valve: The mitral valve has been repaired/replaced. There is  moderate thickening of the mitral valve leaflet(s). No evidence of mitral  valve regurgitation. There is a Medtronic annuloplasty ring and neochordae  present in the mitral position. No   evidence of mitral valve stenosis. MV peak gradient, 8.8 mmHg. The mean  mitral valve gradient is 3.0 mmHg with average heart rate of 77  bpm.   Tricuspid Valve: The tricuspid valve is normal in structure. Tricuspid  valve regurgitation is not demonstrated. No evidence of tricuspid  stenosis.   Aortic Valve: The aortic valve is tricuspid. Aortic valve regurgitation is  not visualized. No aortic stenosis is present.   Pulmonic Valve: The pulmonic valve was normal in structure. Pulmonic valve  regurgitation is not visualized. No evidence of pulmonic stenosis.   Aorta: The aortic root is normal in size and structure.   Venous: The inferior vena cava is normal in size with greater than 50%  respiratory variability, suggesting right atrial pressure of 3 mmHg.   IAS/Shunts: No atrial level shunt detected by color flow Doppler.      LEFT VENTRICLE  PLAX 2D  LVIDd:         4.40 cm  Diastology  LVIDs:         3.20 cm  LV e' medial:    5.77 cm/s  LV PW:         1.00 cm  LV E/e' medial:  21.5  LV IVS:        0.60 cm  LV e' lateral:   6.22 cm/s  LVOT diam:     2.30 cm  LV E/e' lateral: 19.9  LV SV:         63  LV SV Index:   39  LVOT Area:     4.15 cm      RIGHT VENTRICLE  RV S prime:     11.70 cm/s  TAPSE (M-mode): 1.4 cm   LEFT ATRIUM             Index       RIGHT ATRIUM           Index  LA diam:        3.60 cm 2.20 cm/m  RA Area:     15.20 cm  LA Vol (A2C):   63.8 ml 39.07 ml/m RA Volume:   37.70 ml  23.09 ml/m  LA Vol (A4C):   46.9 ml 28.72 ml/m  LA Biplane Vol: 58.0 ml 35.52 ml/m   AORTIC VALVE  LVOT Vmax:   85.00 cm/s  LVOT Vmean:  50.550 cm/s  LVOT VTI:    0.152 m     AORTA  Ao Root diam: 3.00 cm  Ao Asc diam:  3.10 cm   MITRAL VALVE  MV Area (PHT)  cm          SHUNTS  MV Area VTI:   1.31 cm     Systemic VTI:  0.15 m  MV Peak grad:  8.8 mmHg     Systemic Diam: 2.30 cm  MV Mean grad:  3.0 mmHg  MV Vmax:       1.48 m/s  MV Vmean:      76.2 cm/s  MV Decel  Time: 376 msec  MV E velocity: 124.00 cm/s  MV A velocity: 118.00 cm/s  MV E/A ratio:  1.05   Mihai Croitoru MD  Electronically signed by Sanda Klein MD  Signature Date/Time: 07/03/2021/5:02:04 PM         Final    Impression:  She is doing well almost 2 months out from mitral valve repair.  A follow-up echocardiogram shows normal mitral valve function with no regurgitation and a low mean gradient of 3 mmHg.  She has already returned to driving.  I told her that she can liberalize her activity as tolerated.  She appears to be maintaining sinus rhythm  and is finishing her prescription of amiodarone tomorrow.  I instructed her to discontinue it after that.  If she maintains sinus rhythm then I would expect her Coumadin could be stopped 3 months postoperatively.  I will leave that decision up to Dr. Gwenlyn Found.  Plan:  She will continue to follow-up with Dr. Gwenlyn Found and will return to see me if she has any problems with her incision.   Gaye Pollack, MD [facility] [phone]

## 2021-07-17 NOTE — Progress Notes (Signed)
[name] [name] 75 y.o. female Nutrition Note  Diagnosis: MVR, minimally invasive   Past Medical History:  Diagnosis Date   Arthritis    Atypical chest pain    Diverticulosis    Gastritis 11/08/2007, 04/12/2012   H pylori bx neg 03/2012   GERD (gastroesophageal reflux disease) 11/08/2007, 04/12/2012   Hepatic cyst    Hiatal hernia 11/08/2007, 04/12/2012   History of kidney stones    Hyperlipidemia    Hypertension    IBS (irritable bowel syndrome)    diarrhea predom   Migraine    Mitral regurgitation    Osteoporosis    DEXA 01/21/2012: -2.1 spine and R fem, -1.8 L fem s/p fosamax  2004-2011 DEXA 04/18/14 @ [facility]: -2.5 - rec to start Prolia     PVC's (premature ventricular contractions)    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   Current Outpatient Medications:    acetaminophen (TYLENOL) 500 MG tablet, Take 500 mg by mouth daily., Disp: , Rfl:    amiodarone (PACERONE) 200 MG tablet, Take 1 tablet (200 mg total) by mouth daily. X 7 days, then decrease to 200 mg ( 1 tab) daily, Disp: 60 tablet, Rfl: 1   aspirin EC 81 MG EC tablet, Take 1 tablet (81 mg total) by mouth daily. Swallow whole., Disp: 30 tablet, Rfl: 11   atorvastatin (LIPITOR) 20 MG tablet, Take 20 mg by mouth daily., Disp: , Rfl:    butalbital-acetaminophen-caffeine (FIORICET) 50-325-40 MG tablet, TAKE 1 TABLET BY MOUTH EVERY 4 HOURS AS NEEDED FOR HEADACHE, Disp: 30 tablet, Rfl: 0   calcium carbonate (OS-CAL - DOSED IN MG OF ELEMENTAL CALCIUM) 1250 (500 Ca) MG tablet, Take 1 tablet by mouth at bedtime., Disp: , Rfl:    Cholecalciferol (VITAMIN D3) 1000 units CAPS, Take 1,000 Units by mouth at bedtime., Disp: , Rfl:    cyanocobalamin 1000 MCG tablet,  Take 1,000 mcg by mouth daily., Disp: , Rfl:    furosemide (LASIX) 20 MG tablet, Take 1 tablet (20 mg total) by mouth daily as needed (for weight gain of 3 lbs or  more)., Disp: 30 tablet, Rfl:    gabapentin (NEURONTIN) 100 MG capsule, Take 1 capsule (100 mg total) by mouth at bedtime. (Patient taking differently: Take 100 mg by mouth at bedtime. Patient takes 2 tablets), Disp: , Rfl:    metoprolol succinate (TOPROL-XL) 25 MG 24 hr tablet, Take 25 mg by mouth 2 (two) times daily. (Patient not taking: Reported on 07/10/2021), Disp: , Rfl:    metoprolol tartrate (LOPRESSOR) 25 MG tablet, Take 0.5 tablets (12.5 mg total) by mouth 2 (two) times daily., Disp: 60 tablet, Rfl: 3   Peppermint Oil (IBGARD) 90 MG CPCR, Take as directed (Patient not taking: Reported on 07/10/2021), Disp: 16 capsule, Rfl: 0   potassium chloride SA (KLOR-CON) 20 MEQ tablet, 1/2 tablet daily on days you take Lasix, Disp: , Rfl:    warfarin (COUMADIN) 2 MG tablet, Take 1-2 tablets Daily or as prescribed by Clinic, Disp: 60 tablet, Rfl: 1   Ht Readings from Last 1 Encounters:  07/11/21 5' 1.25" (1.556 m)     Wt Readings from Last 3 Encounters:  07/11/21 133 lb 13.1 oz (60.7 kg)  06/19/21 134 lb 6.4 oz (61  kg)  06/17/21 133 lb (60.3 kg)     There is no height or weight on file to calculate BMI.   Social History   Tobacco Use  Smoking Status Never  Smokeless Tobacco Never     Lab Results  Component Value Date   CHOL 176 02/19/2021   Lab Results  Component Value Date   HDL 49.50 02/19/2021   Lab Results  Component Value Date   LDLCALC 96 02/19/2021   Lab Results  Component Value Date   TRIG 153.0 (H) 02/19/2021     Lab Results  Component Value Date   HGBA1C 5.6 02/19/2021     CBG (last 3)  No results for input(s): GLUCAP in the last 72 hours.   Nutrition Note  Spoke with pt. Nutrition Plan and Nutrition Survey goals reviewed with pt. Pt is following a Heart Healthy diet. Per survey, pt realizes  she could increase vegetables, nuts, beans, and fish. We reviewed ways to incorporate more of these foods.   Per discussion, pt does not use canned/convenience foods often. Pt does add salt to food. Pt does not eat out frequently. Pt tries to limit sodium. She typically eats mostly fresh foods.She feels comfortable reading labels but typically does not read labels.   Pt is avoiding leafy greens as she is on coumadin. We discussed importance of consistent intake of vitamin k.   Pt expressed understanding of the information reviewed.    Nutrition Diagnosis Food-and nutrition-related knowledge deficit related to lack of exposure to information as related to AVR, food-medication interaction  Nutrition Intervention Pt's individual nutrition plan reviewed with pt. Benefits of adopting Heart Healthy diet discussed when Picture Your Plate reviewed.  Continue client-centered nutrition education by RD, as part of interdisciplinary care.  Goal(s) Pt to build a healthy plate including vegetables, fruits, whole grains, and low-fat dairy products in a heart healthy meal plan. Pt to eat fish 2x/week Pt to eat more beans and nuts  Plan:  Will provide client-centered nutrition education as part of interdisciplinary care Monitor and evaluate progress toward nutrition goal with team.   Michaele Offer, MS, RDN, LDN, CDCES

## 2021-07-19 ENCOUNTER — Other Ambulatory Visit: Payer: Self-pay

## 2021-07-19 ENCOUNTER — Encounter (HOSPITAL_COMMUNITY)
Admission: RE | Admit: 2021-07-19 | Discharge: 2021-07-19 | Disposition: A | Discharge status: Home / Self Care | Payer: PPO | Source: Ambulatory Visit | Attending: Cardiovascular Disease | Admitting: Cardiovascular Disease

## 2021-07-19 DIAGNOSIS — Z48812 Encounter for surgical aftercare following surgery on the circulatory system: Secondary | ICD-10-CM | POA: Diagnosis not present

## 2021-07-19 DIAGNOSIS — Z9889 Other specified postprocedural states: Secondary | ICD-10-CM

## 2021-07-22 ENCOUNTER — Ambulatory Visit (INDEPENDENT_AMBULATORY_CARE_PROVIDER_SITE_OTHER): Payer: PPO

## 2021-07-22 ENCOUNTER — Telehealth: Payer: PPO

## 2021-07-22 ENCOUNTER — Encounter (HOSPITAL_COMMUNITY)
Admission: RE | Admit: 2021-07-22 | Discharge: 2021-07-22 | Disposition: A | Discharge status: Home / Self Care | Payer: PPO | Source: Ambulatory Visit | Attending: Cardiovascular Disease | Admitting: Cardiovascular Disease

## 2021-07-22 ENCOUNTER — Telehealth: Payer: Self-pay | Admitting: Pharmacist

## 2021-07-22 ENCOUNTER — Other Ambulatory Visit: Payer: Self-pay

## 2021-07-22 DIAGNOSIS — Z5181 Encounter for therapeutic drug level monitoring: Secondary | ICD-10-CM

## 2021-07-22 DIAGNOSIS — Z9889 Other specified postprocedural states: Secondary | ICD-10-CM | POA: Diagnosis not present

## 2021-07-22 DIAGNOSIS — I340 Nonrheumatic mitral (valve) insufficiency: Secondary | ICD-10-CM

## 2021-07-22 DIAGNOSIS — Z7901 Long term (current) use of anticoagulants: Secondary | ICD-10-CM

## 2021-07-22 DIAGNOSIS — Z48812 Encounter for surgical aftercare following surgery on the circulatory system: Secondary | ICD-10-CM | POA: Diagnosis not present

## 2021-07-22 LAB — POCT INR: INR: 1.7 — AB (ref 2.0–3.0)

## 2021-07-22 MED ORDER — WARFARIN SODIUM 2 MG PO TABS: ORAL_TABLET | ORAL | 1 refills | Status: DC

## 2021-07-22 NOTE — Progress Notes (Signed)
    Chronic Care Management Pharmacy Assistant   Name: [name] [name]  MRN: [id number] DOB: 1946-04-21   Reason for Encounter: Medication Review    Medications: Outpatient Encounter Medications as of 07/22/2021  Medication Sig   acetaminophen (TYLENOL) 500 MG tablet Take 500 mg by mouth daily.   amiodarone (PACERONE) 200 MG tablet Take 1 tablet (200 mg total) by mouth daily. X 7 days, then decrease to 200 mg ( 1 tab) daily   aspirin EC 81 MG EC tablet Take 1 tablet (81 mg total) by mouth daily. Swallow whole.   atorvastatin (LIPITOR) 20 MG tablet Take 20 mg by mouth daily.   butalbital-acetaminophen-caffeine (FIORICET) 50-325-40 MG tablet TAKE 1 TABLET BY MOUTH EVERY 4 HOURS AS NEEDED FOR HEADACHE (Patient not taking: Reported on 07/17/2021)   calcium carbonate (OS-CAL - DOSED IN MG OF ELEMENTAL CALCIUM) 1250 (500 Ca) MG tablet Take 1 tablet by mouth at bedtime.   Cholecalciferol (VITAMIN D3) 1000 units CAPS Take 1,000 Units by mouth at bedtime.   cyanocobalamin 1000 MCG tablet Take 1,000 mcg by mouth daily.   furosemide (LASIX) 20 MG tablet Take 1 tablet (20 mg total) by mouth daily as needed (for weight gain of 3 lbs or  more).   gabapentin (NEURONTIN) 100 MG capsule Take 1 capsule (100 mg total) by mouth at bedtime. (Patient taking differently: Take 100 mg by mouth at bedtime. Patient takes 2 tablets)   metoprolol succinate (TOPROL-XL) 25 MG 24 hr tablet Take 25 mg by mouth 2 (two) times daily. (Patient not taking: Reported on 07/17/2021)   metoprolol tartrate (LOPRESSOR) 25 MG tablet Take 0.5 tablets (12.5 mg total) by mouth 2 (two) times daily.   Peppermint Oil (IBGARD) 90 MG CPCR Take as directed   potassium chloride SA (KLOR-CON) 20 MEQ tablet 1/2 tablet daily on days you take Lasix   warfarin (COUMADIN) 2 MG tablet Take 1-2 tablets Daily or as prescribed by Clinic   No facility-administered encounter medications on file as of 07/22/2021.    Pharmacist Review Received call from  patient regarding medication management via [facility].  Patient requested an acute fill for Warfarin, Gabapentin, Furosemide, Potassium and metoprolol to be delivered: 07/23/21 Pharmacy needs refills? No  Confirmed delivery date of 07/23/21, advised patient that pharmacy will contact them the morning of delivery.  [place] Pharmacist Assistant [phone]   Time spent:20

## 2021-07-22 NOTE — Patient Instructions (Signed)
Take 2 tablets tonight only and then Increase to 1 tablet Daily, except 1.5 tablets on Monday and Friday.  INR in 3 weeks. [phone]

## 2021-07-24 ENCOUNTER — Encounter (HOSPITAL_COMMUNITY): Payer: PPO

## 2021-07-26 ENCOUNTER — Other Ambulatory Visit: Payer: Self-pay

## 2021-07-26 ENCOUNTER — Encounter (HOSPITAL_COMMUNITY)
Admission: RE | Admit: 2021-07-26 | Discharge: 2021-07-26 | Disposition: A | Discharge status: Home / Self Care | Payer: PPO | Source: Ambulatory Visit | Attending: Cardiovascular Disease | Admitting: Cardiovascular Disease

## 2021-07-26 DIAGNOSIS — Z9889 Other specified postprocedural states: Secondary | ICD-10-CM | POA: Insufficient documentation

## 2021-07-29 ENCOUNTER — Other Ambulatory Visit: Payer: Self-pay

## 2021-07-29 ENCOUNTER — Emergency Department (HOSPITAL_BASED_OUTPATIENT_CLINIC_OR_DEPARTMENT_OTHER): Payer: PPO

## 2021-07-29 ENCOUNTER — Observation Stay (HOSPITAL_BASED_OUTPATIENT_CLINIC_OR_DEPARTMENT_OTHER)
Admission: EM | Admit: 2021-07-29 | Discharge: 2021-07-30 | Disposition: A | Discharge status: Home / Self Care | Payer: PPO | Attending: Internal Medicine | Admitting: Internal Medicine

## 2021-07-29 ENCOUNTER — Encounter (HOSPITAL_BASED_OUTPATIENT_CLINIC_OR_DEPARTMENT_OTHER): Payer: Self-pay | Admitting: Emergency Medicine

## 2021-07-29 DIAGNOSIS — R7303 Prediabetes: Secondary | ICD-10-CM | POA: Diagnosis not present

## 2021-07-29 DIAGNOSIS — H532 Diplopia: Principal | ICD-10-CM

## 2021-07-29 DIAGNOSIS — I10 Essential (primary) hypertension: Secondary | ICD-10-CM | POA: Insufficient documentation

## 2021-07-29 DIAGNOSIS — R2990 Unspecified symptoms and signs involving the nervous system: Secondary | ICD-10-CM

## 2021-07-29 DIAGNOSIS — Z20822 Contact with and (suspected) exposure to covid-19: Secondary | ICD-10-CM | POA: Diagnosis not present

## 2021-07-29 DIAGNOSIS — Y900 Blood alcohol level of less than 20 mg/100 ml: Secondary | ICD-10-CM | POA: Insufficient documentation

## 2021-07-29 DIAGNOSIS — R531 Weakness: Secondary | ICD-10-CM

## 2021-07-29 DIAGNOSIS — I771 Stricture of artery: Secondary | ICD-10-CM | POA: Diagnosis not present

## 2021-07-29 DIAGNOSIS — I639 Cerebral infarction, unspecified: Secondary | ICD-10-CM | POA: Diagnosis not present

## 2021-07-29 DIAGNOSIS — Z7982 Long term (current) use of aspirin: Secondary | ICD-10-CM | POA: Insufficient documentation

## 2021-07-29 DIAGNOSIS — R42 Dizziness and giddiness: Secondary | ICD-10-CM | POA: Diagnosis not present

## 2021-07-29 DIAGNOSIS — I480 Paroxysmal atrial fibrillation: Secondary | ICD-10-CM | POA: Insufficient documentation

## 2021-07-29 DIAGNOSIS — Z7901 Long term (current) use of anticoagulants: Secondary | ICD-10-CM | POA: Diagnosis not present

## 2021-07-29 DIAGNOSIS — H538 Other visual disturbances: Secondary | ICD-10-CM | POA: Diagnosis not present

## 2021-07-29 DIAGNOSIS — I672 Cerebral atherosclerosis: Secondary | ICD-10-CM | POA: Diagnosis not present

## 2021-07-29 HISTORY — DX: Diplopia: H53.2

## 2021-07-29 LAB — PROTIME-INR
INR: 2.5 — ABNORMAL HIGH (ref 0.8–1.2)
Prothrombin Time: 26.8 seconds — ABNORMAL HIGH (ref 11.4–15.2)

## 2021-07-29 LAB — DIFFERENTIAL
Abs Immature Granulocytes: 0.01 10*3/uL (ref 0.00–0.07)
Basophils Absolute: 0 10*3/uL (ref 0.0–0.1)
Basophils Relative: 1 %
Eosinophils Absolute: 0.1 10*3/uL (ref 0.0–0.5)
Eosinophils Relative: 3 %
Immature Granulocytes: 0 %
Lymphocytes Relative: 22 %
Lymphs Abs: 1.1 10*3/uL (ref 0.7–4.0)
Monocytes Absolute: 0.6 10*3/uL (ref 0.1–1.0)
Monocytes Relative: 12 %
Neutro Abs: 3.1 10*3/uL (ref 1.7–7.7)
Neutrophils Relative %: 62 %

## 2021-07-29 LAB — RAPID URINE DRUG SCREEN, HOSP PERFORMED
Amphetamines: NOT DETECTED
Barbiturates: NOT DETECTED
Benzodiazepines: NOT DETECTED
Cocaine: NOT DETECTED
Opiates: NOT DETECTED
Tetrahydrocannabinol: NOT DETECTED

## 2021-07-29 LAB — CBG MONITORING, ED: Glucose-Capillary: 174 mg/dL — ABNORMAL HIGH (ref 70–99)

## 2021-07-29 LAB — URINALYSIS, ROUTINE W REFLEX MICROSCOPIC
Bilirubin Urine: NEGATIVE
Glucose, UA: NEGATIVE mg/dL
Ketones, ur: NEGATIVE mg/dL
Leukocytes,Ua: NEGATIVE
Nitrite: NEGATIVE
Protein, ur: NEGATIVE mg/dL
Specific Gravity, Urine: 1.023 (ref 1.005–1.030)
pH: 7 (ref 5.0–8.0)

## 2021-07-29 LAB — APTT: aPTT: 36 seconds (ref 24–36)

## 2021-07-29 LAB — CBC
HCT: 41.1 % (ref 36.0–46.0)
Hemoglobin: 13.1 g/dL (ref 12.0–15.0)
MCH: 30.8 pg (ref 26.0–34.0)
MCHC: 31.9 g/dL (ref 30.0–36.0)
MCV: 96.7 fL (ref 80.0–100.0)
Platelets: 243 10*3/uL (ref 150–400)
RBC: 4.25 MIL/uL (ref 3.87–5.11)
RDW: 13.4 % (ref 11.5–15.5)
WBC: 4.9 10*3/uL (ref 4.0–10.5)
nRBC: 0 % (ref 0.0–0.2)

## 2021-07-29 LAB — COMPREHENSIVE METABOLIC PANEL
ALT: 14 U/L (ref 0–44)
AST: 20 U/L (ref 15–41)
Albumin: 4 g/dL (ref 3.5–5.0)
Alkaline Phosphatase: 48 U/L (ref 38–126)
Anion gap: 9 (ref 5–15)
BUN: 18 mg/dL (ref 8–23)
CO2: 27 mmol/L (ref 22–32)
Calcium: 9.5 mg/dL (ref 8.9–10.3)
Chloride: 103 mmol/L (ref 98–111)
Creatinine, Ser: 0.99 mg/dL (ref 0.44–1.00)
GFR, Estimated: 59 mL/min — ABNORMAL LOW (ref >60)
Glucose, Bld: 146 mg/dL — ABNORMAL HIGH (ref 70–99)
Potassium: 3.6 mmol/L (ref 3.5–5.1)
Sodium: 139 mmol/L (ref 135–145)
Total Bilirubin: 0.4 mg/dL (ref 0.3–1.2)
Total Protein: 6.9 g/dL (ref 6.5–8.1)

## 2021-07-29 LAB — RESP PANEL BY RT-PCR (FLU A&B, COVID) ARPGX2
Influenza A by PCR: NEGATIVE
Influenza B by PCR: NEGATIVE
SARS Coronavirus 2 by RT PCR: NEGATIVE

## 2021-07-29 LAB — ETHANOL: Alcohol, Ethyl (B): <10 mg/dL (ref <10)

## 2021-07-29 MED ORDER — IOHEXOL 350 MG/ML SOLN
75.0000 mL | Freq: Once | INTRAVENOUS | Status: AC | PRN
  Administered 2021-07-29: 75 mL via INTRAVENOUS

## 2021-07-29 NOTE — ED Notes (Addendum)
Pt up to BR. States "my vision is doubled" upon standing. Able to ambulate with standby assist to and from bathroom. Primary RN made aware.

## 2021-07-29 NOTE — ED Triage Notes (Signed)
Pt from Home c/o blurry vision that started in the left eye and has progressed to double vision. The blurry vision started at approx 1600 yesterday. The second eye started at approx 0800 this morning. Pt complains of dizziness and diarrhea that started a week ago.   Pt usually walks unassisted but needs to hold on to a wall or rail to walk.

## 2021-07-29 NOTE — ED Provider Notes (Addendum)
[facility] EMERGENCY DEPT Provider Note   CSN: [id number] Arrival date & time: 07/29/21  1218  An emergency department physician performed an initial assessment on this suspected stroke patient at 1300.  History Chief Complaint  Patient presents with   Dizziness    [name] [name] is a 75 y.o. female.  75 year old female history of mitral valve repair on warfarin presented to the ER secondary to diplopia, difficulty in ambulation.  Patient ports approximately 4 PM yesterday evening she began to experience double vision to her left eye.  No history of prior symptoms.  At approximate o'clock this morning she began to experience binocular diplopia, difficulty with ambulation secondary to vision changes, weakness to her lower extremities. Patient is anticoagulated, last INR was 1.6.   Reports that over the past week or so is been having intermittent diarrhea, nausea, vomiting.  Reduced oral intake.  No fevers, chills.  No change to oral intake.  No recent travel or sick contacts.  No falls or head injury.  The history is provided by the patient. No language interpreter was used.  Dizziness Associated symptoms: weakness   Associated symptoms: no chest pain, no headaches, no nausea, no palpitations, no shortness of breath and no vomiting       Past Medical History:  Diagnosis Date   Arthritis    Atypical chest pain    Diverticulosis    Gastritis 11/08/2007, 04/12/2012   H pylori bx neg 03/2012   GERD (gastroesophageal reflux disease) 11/08/2007, 04/12/2012   Hepatic cyst    Hiatal hernia 11/08/2007, 04/12/2012   History of kidney stones    Hyperlipidemia    Hypertension    IBS (irritable bowel syndrome)    diarrhea predom   Migraine    Mitral regurgitation    Osteoporosis    DEXA 01/21/2012: -2.1 spine and R fem, -1.8 L fem s/p fosamax  2004-2011 DEXA 04/18/14 @ [facility]: -2.5 - rec to start Prolia     PVC's (premature ventricular contractions)    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   Complex valvuloplasty including PTFE neochord placement x6 with 30 mm Medtronic Simuform ring annuloplasty with clipping of LA appendage   Schatzki's ring 11/11/2010   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   Shingles outbreak 04/2013    Patient Active Problem List   Diagnosis Date Noted   Binocular vision disorder with diplopia 07/29/2021   Paroxysmal atrial fibrillation ([place]) 06/19/2021   Long term (current) use of anticoagulants 05/29/2021   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   Mitral regurgitation 02/28/2021   Heart failure ([place]) 02/21/2021   Murmur 02/21/2021   Abnormal CT scan of lung 02/06/2021   Chest pain 02/05/2021   Cardiomegaly 02/05/2021   Left lower quadrant abdominal pain 05/01/2020   Acute post-traumatic headache, not intractable 11/15/2019   Prediabetes 05/02/2019   Carbuncle of groin 02/25/2019   Skin abnormalities 05/07/2018   Bilateral shoulder pain 12/11/2017   Gastritis 12/11/2017   Trigger finger, right little finger 09/11/2017   Numbness and tingling in left hand 07/20/2017   Dupuytren's contracture of hand 07/20/2017   CTS (carpal tunnel syndrome) 07/17/2017   Bilateral carpal tunnel syndrome 07/17/2017   Arthralgia 06/30/2017   Leg cramps 06/10/2017   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   Chronic midline low back pain without sciatica 09/15/2016   Osteopenia 07/20/2016   Bilateral finger numbness 06/03/2016   Joint pain 06/03/2016   Hyperlipidemia 02/09/2015   Bilateral primary osteoarthritis of knee 10/03/2013  Lateral meniscal tear 10/03/2013   Patellofemoral pain syndrome 10/03/2013   Varicose vein of leg    Allergic rhinitis    IBS (irritable bowel syndrome) 04/07/2011   Epigastric pain 12/26/2010   Diarrhea 12/25/2010   Migraine headache 09/27/2010   Essential hypertension 09/27/2010   GERD 09/27/2010   RENAL CALCULUS, HX OF 09/27/2010    Past Surgical History:  Procedure  Laterality Date   [place] STUDY  03/14/2021   Procedure: BUBBLE STUDY;  Surgeon: Sueanne Margarita, MD;  Location: [facility];  Service: Cardiovascular;;   CARDIAC CATHETERIZATION     CLIPPING OF ATRIAL APPENDAGE N/A 05/21/2021   Procedure: CLIPPING OF ATRIAL APPENDAGE USING ATRICURE 40MM PRO2 CLIP;  Surgeon: Rexene Alberts, MD;  Location: [facility];  Service: Open Heart Surgery;  Laterality: N/A;   COLONOSCOPY     CYSTOCELE REPAIR  2008   ESOPHAGOGASTRODUODENOSCOPY     Maxilofacial  1992   MITRAL VALVE REPAIR  05/21/2021   Procedure: MINIMALLY INVASIVE MITRAL VALVE REPAIR (MVR) USING MEDTRONIC SIMUFORM 30MM RING;  Surgeon: Rexene Alberts, MD;  Location: [facility];  Service: Open Heart Surgery;;   RIGHT/LEFT HEART CATH AND CORONARY ANGIOGRAPHY N/A 03/14/2021   Procedure: RIGHT/LEFT HEART CATH AND CORONARY ANGIOGRAPHY;  Surgeon: Lorretta Harp, MD;  Location: [facility];  Service: Cardiovascular;  Laterality: N/A;   TEE WITHOUT CARDIOVERSION N/A 03/14/2021   Procedure: TRANSESOPHAGEAL ECHOCARDIOGRAM (TEE);  Surgeon: Sueanne Margarita, MD;  Location: [facility];  Service: Cardiovascular;  Laterality: N/A;   TEE WITHOUT CARDIOVERSION N/A 05/21/2021   Procedure: TRANSESOPHAGEAL ECHOCARDIOGRAM (TEE);  Surgeon: Rexene Alberts, MD;  Location: [facility];  Service: Open Heart Surgery;  Laterality: N/A;   TONSILLECTOMY  1960     OB History   No obstetric history on file.     Family History  Problem Relation Age of Onset   Hypertension Mother    Alzheimer's disease Mother 45   AAA (abdominal aortic aneurysm) Mother    Stroke Father 59   Kidney cancer Brother        mets   Bone cancer Brother 108   Colon cancer Neg Hx     Social History   Tobacco Use   Smoking status: Never   Smokeless tobacco: Never  Vaping Use   Vaping Use: Never used  Substance Use Topics   Alcohol use: No   Drug use: No    Home Medications Prior to Admission medications    Medication Sig Start Date End Date Taking? Authorizing Provider  acetaminophen (TYLENOL) 500 MG tablet Take 500 mg by mouth daily.    [provider]  amiodarone (PACERONE) 200 MG tablet Take 1 tablet (200 mg total) by mouth daily. X 7 days, then decrease to 200 mg ( 1 tab) daily 06/17/21   Barrett, Lodema Hong, PA-C  aspirin EC 81 MG EC tablet Take 1 tablet (81 mg total) by mouth daily. Swallow whole. 05/26/21   Barrett, Erin R, PA-C  atorvastatin (LIPITOR) 20 MG tablet Take 20 mg by mouth daily. 05/20/21   [provider]  butalbital-acetaminophen-caffeine (FIORICET) 50-325-40 MG tablet TAKE 1 TABLET BY MOUTH EVERY 4 HOURS AS NEEDED FOR HEADACHE Patient not taking: Reported on 07/17/2021 03/13/20   Binnie Rail, MD  calcium carbonate (OS-CAL - DOSED IN MG OF ELEMENTAL CALCIUM) 1250 (500 Ca) MG tablet Take 1 tablet by mouth at bedtime.    [provider]  Cholecalciferol (VITAMIN D3) 1000  units CAPS Take 1,000 Units by mouth at bedtime.    [provider]  cyanocobalamin 1000 MCG tablet Take 1,000 mcg by mouth daily.    [provider]  furosemide (LASIX) 20 MG tablet Take 1 tablet (20 mg total) by mouth daily as needed (for weight gain of 3 lbs or  more). 06/17/21   Barrett, Erin R, PA-C  gabapentin (NEURONTIN) 100 MG capsule Take 1 capsule (100 mg total) by mouth at bedtime. Patient taking differently: Take 100 mg by mouth at bedtime. Patient takes 2 tablets 03/14/21   Sueanne Margarita, MD  metoprolol succinate (TOPROL-XL) 25 MG 24 hr tablet Take 25 mg by mouth 2 (two) times daily. Patient not taking: Reported on 07/17/2021 05/20/21   [provider]  metoprolol tartrate (LOPRESSOR) 25 MG tablet Take 0.5 tablets (12.5 mg total) by mouth 2 (two) times daily. 05/26/21   Barrett, Lodema Hong, PA-C  Peppermint Oil (IBGARD) 90 MG CPCR Take as directed 12/24/20   Armbruster, Carlota Raspberry, MD  potassium chloride SA (KLOR-CON) 20 MEQ tablet 1/2 tablet daily on days you take  Lasix 06/17/21   Barrett, Erin R, PA-C  warfarin (COUMADIN) 2 MG tablet Take 1-2 tablets Daily or as prescribed by Clinic 07/22/21   Lorretta Harp, MD    Allergies    Patient has no known allergies.  Review of Systems   Review of Systems  Constitutional:  Negative for chills and fever.  HENT:  Negative for facial swelling and trouble swallowing.   Eyes:  Positive for visual disturbance. Negative for photophobia.  Respiratory:  Negative for cough and shortness of breath.   Cardiovascular:  Negative for chest pain and palpitations.  Gastrointestinal:  Negative for abdominal pain, nausea and vomiting.  Endocrine: Negative for polydipsia and polyuria.  Genitourinary:  Negative for difficulty urinating and hematuria.  Musculoskeletal:  Positive for gait problem. Negative for joint swelling.  Skin:  Negative for pallor and rash.  Neurological:  Positive for dizziness and weakness. Negative for syncope and headaches.  Psychiatric/Behavioral:  Negative for agitation and confusion.    Physical Exam Updated Vital Signs BP (!) 144/91   Pulse 81   Temp 98.7 F (37.1 C) (Oral)   Resp 17   Ht '5\' 4"'$  (1.626 m)   Wt 59.4 kg   SpO2 98%   BMI 22.49 kg/m   Physical Exam Vitals and nursing note reviewed.  Constitutional:      General: She is not in acute distress.    Appearance: Normal appearance.  HENT:     Head: Normocephalic and atraumatic.     Right Ear: External ear normal.     Left Ear: External ear normal.     Nose: Nose normal.     Mouth/Throat:     Mouth: Mucous membranes are moist.  Eyes:     General: Lids are normal. Vision grossly intact. Gaze aligned appropriately. No visual field deficit or scleral icterus.       Right eye: No discharge.        Left eye: No discharge.     Extraocular Movements: Extraocular movements intact.     Conjunctiva/sclera: Conjunctivae normal.     Pupils: Pupils are equal, round, and reactive to light.     Comments: Binocular diplopia   Cardiovascular:     Rate and Rhythm: Normal rate and regular rhythm.     Pulses: Normal pulses.     Heart sounds: Normal heart sounds.  Pulmonary:  Effort: Pulmonary effort is normal. No respiratory distress.     Breath sounds: Normal breath sounds.  Abdominal:     General: Abdomen is flat.     Tenderness: There is no abdominal tenderness.  Musculoskeletal:        General: Normal range of motion.     Cervical back: Normal range of motion.     Right lower leg: No edema.     Left lower leg: No edema.  Skin:    General: Skin is warm and dry.     Capillary Refill: Capillary refill takes less than 2 seconds.  Neurological:     Mental Status: She is alert and oriented to person, place, and time.     GCS: GCS eye subscore is 4. GCS verbal subscore is 5. GCS motor subscore is 6.     Cranial Nerves: Cranial nerves are intact. No facial asymmetry.     Sensory: Sensation is intact. No sensory deficit.     Motor: Motor function is intact.     Coordination: Coordination is intact. Finger-Nose-Finger Test normal.     Comments: Pronator drift to right upper extremity, NIHSS 1  Psychiatric:        Mood and Affect: Mood normal.        Behavior: Behavior normal.    ED Results / Procedures / Treatments   Labs (all labs ordered are listed, but only abnormal results are displayed) Labs Reviewed  PROTIME-INR - Abnormal; Notable for the following components:      Result Value   Prothrombin Time 26.8 (*)    INR 2.5 (*)    All other components within normal limits  COMPREHENSIVE METABOLIC PANEL - Abnormal; Notable for the following components:   Glucose, Bld 146 (*)    GFR, Estimated 59 (*)    All other components within normal limits  URINALYSIS, ROUTINE W REFLEX MICROSCOPIC - Abnormal; Notable for the following components:   Color, Urine COLORLESS (*)    Hgb urine dipstick MODERATE (*)    All other components within normal limits  CBG MONITORING, ED - Abnormal; Notable for the following  components:   Glucose-Capillary 174 (*)    All other components within normal limits  RESP PANEL BY RT-PCR (FLU A&B, COVID) ARPGX2  ETHANOL  APTT  CBC  DIFFERENTIAL  RAPID URINE DRUG SCREEN, HOSP PERFORMED    EKG EKG Interpretation  Date/Time:  Monday July 29 2021 12:49:42 EDT Ventricular Rate:  84 PR Interval:  174 QRS Duration: 110 QT Interval:  412 QTC Calculation: 487 R Axis:   60 Text Interpretation: Sinus rhythm Borderline T wave abnormalities Similar prior ECG Confirmed by Wynona Dove ([id number]) on 07/29/2021 3:43:24 PM  Radiology CT HEAD CODE STROKE WO CONTRAST  Addendum Date: 07/29/2021   ADDENDUM REPORT: 07/29/2021 13:33 ADDENDUM: After several unsuccessful attempts, these results were called by telephone at the time of interpretation on 07/29/2021 at 1:28 pm to provider Wynona Dove , who verbally acknowledged these results. Electronically Signed   By: Kellie Simmering D.O.   On: 07/29/2021 13:33   Result Date: 07/29/2021 CLINICAL DATA:  Code stroke. Neuro deficit, acute, stroke suspected. Additional provided: Blurry vision in left eye, double vision, dizziness, diarrhea, symptoms began 1 week ago. EXAM: CT HEAD WITHOUT CONTRAST TECHNIQUE: Contiguous axial images were obtained from the base of the skull through the vertex without intravenous contrast. COMPARISON:  Head CT 11/15/2019. FINDINGS: Brain: Mild generalized cerebral and cerebellar atrophy. Mild patchy and ill-defined hypoattenuation within the cerebral white  matter, nonspecific but compatible with chronic small vessel ischemic disease. 1.5 cm calcified meningioma overlying the left frontal lobe. As before, there is minimal local mass effect upon the underlying left frontal lobe. There is no acute intracranial hemorrhage. No demarcated cortical infarct. No extra-axial fluid collection. No midline shift. Vascular: No hyperdense vessel.  Atherosclerotic calcifications. Skull: Normal. Negative for fracture or focal lesion.  Sinuses/Orbits: The left sphenoid sinus is partially opacified with partially calcified material, not simply changed from the prior head CT of 11/15/2019. Mild bilateral ethmoid sinus mucosal thickening. ASPECTS ([place] Stroke Program Early CT Score) - Ganglionic level infarction (caudate, lentiform nuclei, internal capsule, insula, M1-M3 cortex): 7 - Supraganglionic infarction (M4-M6 cortex): 3 Total score (0-10 with 10 being normal): 10 IMPRESSION: No evidence of acute infarct or acute intracranial hemorrhage. Mild chronic small-vessel ischemic changes within the cerebral white matter. Unchanged 1.5 cm calcified meningioma overlying the left frontal lobe with minimal parenchymal mass effect. Mild generalized parenchymal atrophy. Paranasal sinus disease, as described. Electronically Signed: By: Kellie Simmering D.O. On: 07/29/2021 13:24   CT ANGIO HEAD NECK W WO CM (CODE STROKE)  Result Date: 07/29/2021 CLINICAL DATA:  Aorta, acute, stroke suspected. Blurry vision. Double vision. EXAM: CT ANGIOGRAPHY HEAD AND NECK TECHNIQUE: Multidetector CT imaging of the head and neck was performed using the standard protocol during bolus administration of intravenous contrast. Multiplanar CT image reconstructions and MIPs were obtained to evaluate the vascular anatomy. Carotid stenosis measurements (when applicable) are obtained utilizing NASCET criteria, using the distal internal carotid diameter as the denominator. CONTRAST:  71m OMNIPAQUE IOHEXOL 350 MG/ML SOLN COMPARISON:  Head CT performed earlier today 07/29/2021. FINDINGS: CTA NECK FINDINGS Aortic arch: Common origin of the innominate and left common carotid arteries. Atherosclerotic plaque within the proximal right subclavian artery. Streak artifact from a dense left-sided contrast bolus partially obscures the left subclavian artery. Within this limitation, there is no appreciable hemodynamically significant stenosis within the innominate or proximal subclavian  arteries. Right carotid system: CCA and ICA patent within the neck without stenosis or significant atherosclerotic disease. Left carotid system: CCA and ICA patent within the neck without stenosis or significant atherosclerotic disease. Vertebral arteries: Vertebral arteries codominant and patent within the neck. Minimal nonstenotic atherosclerotic plaque at the origin of the left vertebral artery. Skeleton: Cervical spondylosis. Trace C3-C4 and C4-C5 grade 1 anterolisthesis. Advanced disc space narrowing with degenerative endplate irregularity and degenerative endplate sclerosis at [id number] No acute bony abnormality or aggressive osseous lesion. Other neck: No neck mass or cervical lymphadenopathy. Thyroid unremarkable. Upper chest: No consolidation within the imaged lung apices. Review of the MIP images confirms the above findings CTA HEAD FINDINGS Anterior circulation: The intracranial internal carotid arteries are patent. Calcified plaque within both vessels without stenosis. The M1 middle cerebral arteries are patent. Atherosclerotic irregularity of the M2 and more distal MCA vessels bilaterally without M2 proximal branch occlusion or severe proximal arterial stenosis identified. The anterior cerebral arteries are patent. One-two mm endplate projecting vascular protrusion arising from the paraclinoid right ICA, which may reflect an aneurysm or infundibulum. Posterior circulation: The intracranial vertebral arteries are patent. The basilar artery is patent. The posterior cerebral arteries are patent bilaterally without high-grade proximal stenosis. Hypoplastic left P1 segment with sizable left posterior communicating artery. The right posterior communicating artery is hypoplastic or absent. Venous sinuses: Within the limitations of contrast timing, no convincing thrombus. Anatomic variants: As described. Review of the MIP images confirms the above findings No intracranial large vessel occlusion. These results  were  called by telephone at the time of interpretation on 07/29/2021 at 2:03 pm to provider Wynona Dove , who verbally acknowledged these results. IMPRESSION: CTA neck: The common carotid, internal carotid and vertebral arteries are patent within the neck without stenosis. Non-stenotic atherosclerotic plaque at the origin of the left vertebral artery. CTA head: 1. Intracranial atherosclerotic disease, as described. No intracranial large vessel occlusion or proximal high-grade arterial stenosis. 2. 1-2 mm vascular protrusion arising from the paraclinoid right ICA, which may reflect an aneurysm or infundibulum. Electronically Signed   By: Kellie Simmering D.O.   On: 07/29/2021 14:05    Procedures Procedures   Medications Ordered in ED Medications  iohexol (OMNIPAQUE) 350 MG/ML injection 75 mL (75 mLs Intravenous Contrast Given 07/29/21 1335)    ED Course  I have reviewed the triage vital signs and the nursing notes.  Pertinent labs & imaging results that were available during my care of the patient were reviewed by me and considered in my medical decision making (see chart for details).    MDM Rules/Calculators/A&P                           This patient complains of diplopia, weakness; this involves an extensive number of treatment options and is a complaint that carries with it a high risk of complications and morbidity.  Vital signs reviewed and are stable. No falls or head injury.  Serious etiologies considered.  NIHSS 1  Onset of symptoms approximately 21 hours ago.  Patient is not tPA candidate at this time secondary to duration of symptoms.  Patient is within thrombectomy window so will initiate stroke alert.    CT head and CT angiography reviewed, no acute CVA.  Discussed with neurology who thinks potentially patient may have a small brainstem stroke.  Recommend MRI and transfer to [facility] for further neurology evaluation.  No anticoagulation recommended given she is at increased bleeding risk. Agree  with neurology.   BP stable at this time. No need for blood pressure augmenting medications at this time.   Labs reviewed, INR is 2.5, patient is on warfarin for mitral valve repair.  Labs reviewed otherwise are stable.  Patient without nausea or abdominal pain.  Given complaint of diarrhea concern for possible viral infection.  Vital signs stable.  Doubt systemic infection. No fever. No sepsis.  No diarrhea since she has been in ED. She has history of IBS, this is also reasonable etiology for symptoms. Supportive care at this time.  At this time recommend transfer for MRI, neurology evaluation.  Patient and family agreeable.  Discussed with hospitalist accepts patient for transfer.  Stable for transfer at this time.    CRITICAL CARE Performed by: Jeanell Sparrow   Total critical care time: 34 minutes  Critical care time was exclusive of separately billable procedures and treating other patients.  Critical care was necessary to treat or prevent imminent or life-threatening deterioration.  Critical care was time spent personally by me on the following activities: development of treatment plan with patient and/or surrogate as well as nursing, discussions with consultants, evaluation of patient's response to treatment, examination of patient, obtaining history from patient or surrogate, ordering and performing treatments and interventions, ordering and review of laboratory studies, ordering and review of radiographic studies, pulse oximetry and re-evaluation of patient's condition.  Final Clinical Impression(s) / ED Diagnoses Final diagnoses:  Stroke-like symptoms  Diplopia  Weakness    Rx / DC  Orders ED Discharge Orders     None        Jeanell Sparrow, DO 07/29/21 [place], [place], DO 07/29/21 [phone]

## 2021-07-29 NOTE — ED Notes (Signed)
Ultraosund IV needed after 2 attempts to obtain in CT

## 2021-07-29 NOTE — ED Notes (Signed)
Report called to accepting Nurse [phone]. All questions and concerns addressed at this time.

## 2021-07-29 NOTE — ED Notes (Signed)
[facility] at bedside 

## 2021-07-29 NOTE — ED Notes (Signed)
CBG obtained. BG 174; RN, Joe, made aware

## 2021-07-29 NOTE — Consult Note (Signed)
[facility] Telemedicine Consult   Requesting Provider: Wynona Dove Consult Participants: Patient, atrium nurse Verdis Frederickson, bedside nurse Zenia Resides, Dr. Pearline Cables, son Dr. Ria Bush Location of the provider: [facility] Location of the patient: [facility]  This consult was provided via telemedicine with 2-way video and audio communication. The patient/family was informed that care would be provided in this way and agreed to receive care in this manner.    Chief Complaint: Double vision  HPI: This is a 75 year old woman with a past medical history significant for hypertension, hyperlipidemia, recent minimally invasive mitral valve replacement and atrial appendage clipping (July 2022), on Coumadin with a persistently subtherapeutic INR since hospital discharge, cervicalgia and migraines with chronic headache (follows with Dr. Tomi Likens), and left frontal meningioma.  Last night she had monocular double vision in the left eye starting about 4 PM.  This morning she woke up at 8 AM with binocular double vision described as vertical diplopia.  She denies any nausea or vomiting, has had some generalized weakness with difficulty ambulating, but no fevers, chills, sweats, other focal neurological symptoms.  She has been compliant with her warfarin and has increased her dose as directed given subtherapeutic INR on last prior check  LKW: 4 PM on 9/4  tpa given?: No, out of the window  IR Thrombectomy? No, no LVO Modified Rankin Scale: 0-Completely asymptomatic and back to baseline post- stroke Time of teleneurologist evaluation: 1:18 PM due to technical difficulties with connecting to telestroke cart  Past Medical History:  Diagnosis Date   Arthritis    Atypical chest pain    Diverticulosis    Gastritis 11/08/2007, 04/12/2012   H pylori bx neg 03/2012   GERD (gastroesophageal reflux disease) 11/08/2007, 04/12/2012   Hepatic cyst    Hiatal hernia 11/08/2007, 04/12/2012   History of kidney stones     Hyperlipidemia    Hypertension    IBS (irritable bowel syndrome)    diarrhea predom   Migraine    Mitral regurgitation    Osteoporosis    DEXA 01/21/2012: -2.1 spine and R fem, -1.8 L fem s/p fosamax  2004-2011 DEXA 04/18/14 @ [facility]: -2.5 - rec to start Prolia     PVC's (premature ventricular contractions)    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   Complex valvuloplasty including PTFE neochord placement x6 with 30 mm Medtronic Simuform ring annuloplasty with clipping of LA appendage   Schatzki's ring 11/11/2010   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   Shingles outbreak 04/2013    Past Surgical History:  Procedure Laterality Date   [place] STUDY  03/14/2021   Procedure: BUBBLE STUDY;  Surgeon: Sueanne Margarita, MD;  Location: [place];  Service: Cardiovascular;;   CARDIAC CATHETERIZATION     CLIPPING OF ATRIAL APPENDAGE N/A 05/21/2021   Procedure: CLIPPING OF ATRIAL APPENDAGE USING ATRICURE 40MM PRO2 CLIP;  Surgeon: Rexene Alberts, MD;  Location: [place];  Service: Open Heart Surgery;  Laterality: N/A;   COLONOSCOPY     CYSTOCELE REPAIR  2008   ESOPHAGOGASTRODUODENOSCOPY     Maxilofacial  1992   MITRAL VALVE REPAIR  05/21/2021   Procedure: MINIMALLY INVASIVE MITRAL VALVE REPAIR (MVR) USING MEDTRONIC SIMUFORM 30MM RING;  Surgeon: Rexene Alberts, MD;  Location: [place];  Service: Open Heart Surgery;;   RIGHT/LEFT HEART CATH AND CORONARY ANGIOGRAPHY N/A 03/14/2021   Procedure: RIGHT/LEFT HEART CATH AND CORONARY ANGIOGRAPHY;  Surgeon: Lorretta Harp, MD;  Location: [place] [facility];  Service: Cardiovascular;  Laterality: N/A;   TEE WITHOUT CARDIOVERSION N/A 03/14/2021   Procedure: TRANSESOPHAGEAL ECHOCARDIOGRAM (TEE);  Surgeon: Sueanne Margarita, MD;  Location: [facility] ENDOSCOPY;  Service: Cardiovascular;  Laterality: N/A;   TEE WITHOUT CARDIOVERSION N/A 05/21/2021   Procedure: TRANSESOPHAGEAL ECHOCARDIOGRAM (TEE);  Surgeon: Rexene Alberts, MD;   Location: [place];  Service: Open Heart Surgery;  Laterality: N/A;   TONSILLECTOMY  1960    No current facility-administered medications for this encounter.  Current Outpatient Medications:    acetaminophen (TYLENOL) 500 MG tablet, Take 500 mg by mouth daily., Disp: , Rfl:    amiodarone (PACERONE) 200 MG tablet, Take 1 tablet (200 mg total) by mouth daily. X 7 days, then decrease to 200 mg ( 1 tab) daily, Disp: 60 tablet, Rfl: 1   aspirin EC 81 MG EC tablet, Take 1 tablet (81 mg total) by mouth daily. Swallow whole., Disp: 30 tablet, Rfl: 11   atorvastatin (LIPITOR) 20 MG tablet, Take 20 mg by mouth daily., Disp: , Rfl:    butalbital-acetaminophen-caffeine (FIORICET) 50-325-40 MG tablet, TAKE 1 TABLET BY MOUTH EVERY 4 HOURS AS NEEDED FOR HEADACHE (Patient not taking: Reported on 07/17/2021), Disp: 30 tablet, Rfl: 0   calcium carbonate (OS-CAL - DOSED IN MG OF ELEMENTAL CALCIUM) 1250 (500 Ca) MG tablet, Take 1 tablet by mouth at bedtime., Disp: , Rfl:    Cholecalciferol (VITAMIN D3) 1000 units CAPS, Take 1,000 Units by mouth at bedtime., Disp: , Rfl:    cyanocobalamin 1000 MCG tablet, Take 1,000 mcg by mouth daily., Disp: , Rfl:    furosemide (LASIX) 20 MG tablet, Take 1 tablet (20 mg total) by mouth daily as needed (for weight gain of 3 lbs or  more)., Disp: 30 tablet, Rfl:    gabapentin (NEURONTIN) 100 MG capsule, Take 1 capsule (100 mg total) by mouth at bedtime. (Patient taking differently: Take 100 mg by mouth at bedtime. Patient takes 2 tablets), Disp: , Rfl:    metoprolol succinate (TOPROL-XL) 25 MG 24 hr tablet, Take 25 mg by mouth 2 (two) times daily. (Patient not taking: Reported on 07/17/2021), Disp: , Rfl:    metoprolol tartrate (LOPRESSOR) 25 MG tablet, Take 0.5 tablets (12.5 mg total) by mouth 2 (two) times daily., Disp: 60 tablet, Rfl: 3   Peppermint Oil (IBGARD) 90 MG CPCR, Take as directed, Disp: 16 capsule, Rfl: 0   potassium chloride SA (KLOR-CON) 20 MEQ tablet, 1/2 tablet daily on  days you take Lasix, Disp: , Rfl:    warfarin (COUMADIN) 2 MG tablet, Take 1-2 tablets Daily or as prescribed by Clinic, Disp: 60 tablet, Rfl: 1    Exam: Vitals:   07/29/21 1252 07/29/21 1300  BP: 137/78 (!) 148/93  Pulse: 86 83  Resp: 20 19  Temp: 98.7 F (37.1 C)   SpO2: 97% 97%    General: Keeps 1 eye closed when looking around the room but otherwise comfortable Pulmonary: breathing comfortably Cardiac: regular rate and rhythm on monitor   NIH Stroke scale 1A: Level of Consciousness - 0 1B: Ask Month and Age - 0 1C: 'Blink Eyes' & 'Squeeze Hands' - 0 2: Test Horizontal Extraocular Movements - 0 3: Test Visual Fields - 0 4: Test Facial Palsy - 0 5A: Test Left Arm Motor Drift - 0 5B: Test Right Arm Motor Drift - 0 6A: Test Left Leg Motor Drift - 0 6B: Test Right Leg Motor Drift - 0 7: Test Limb Ataxia - 0 8: Test Sensation - 1 reduced in the left  upper face 9: Test Language/Aphasia- 0 10: Test Dysarthria - 0 11: Test Extinction/Inattention - 0 NIHSS score: 1   Imaging Reviewed:  Head CT without acute intracranial process though there is some chronic microvascular changes possibly slightly worse on the left compared to the right, which could mask an acute infarct  CTA head and neck personally reviewed  1. Intracranial atherosclerotic disease, as described. No intracranial large vessel occlusion or proximal high-grade arterial stenosis. 2. 1-2 mm vascular protrusion arising from the paraclinoid right ICA, which may reflect an aneurysm or infundibulum.  Labs reviewed in epic and pertinent values follow:   Basic Metabolic Panel:   CBC: Recent Labs  Lab 07/29/21 1330  WBC 4.9  NEUTROABS 3.1  HGB 13.1  HCT 41.1  MCV 96.7  PLT 243    Lab Results  Component Value Date   INR 2.5 (H) 07/29/2021   INR 1.7 (A) 07/22/2021   INR 1.7 (A) 07/03/2021   Glu 174  Lipid Panel     Component Value Date/Time   CHOL 176 02/19/2021 0737   TRIG 153.0 (H)  02/19/2021 0737   TRIG 177 06/14/2010 0000   HDL 49.50 02/19/2021 0737   CHOLHDL 4 02/19/2021 0737   VLDL 30.6 02/19/2021 0737   LDLCALC 96 02/19/2021 0737   LDLDIRECT 75.0 11/02/2019 1006    Lab Results  Component Value Date   HGBA1C 5.6 02/19/2021   Assessment:   Recommendations:  - Admit to [facility] hospital to medicine team  - MRI brain - Hold warfarin until MRI brain confirms no large stroke, reach out to neurologist on call once MRI completed (myself until 8 PM or neurologist listed on call overnight) - PT/OT eval - No need to repeat ECHO unless cardiac concerns develop given patient is already on anticoagulation - Permissive hypertension to 220/110 - Adjust home statin if needed for LDL goal less than 70 - Hemoglobin A1c goal less than 7% - Attention on follow-up imaging on an outpatient basis for possible 1 to 2 mm right ICA aneurysm versus infundibulum, consider reimaging in 6 to 12 months - Stroke team to follow in consultation  This patient is receiving care for acute neurological changes . There was 65 minutes of care by this provider at the time of service, including time for direct evaluation via telemedicine, review of medical records, imaging studies and discussion of findings with providers, the patient and/or family.  Lesleigh Noe MD-PhD [facility] [phone]   If 8pm-8am, please page neurology on call as listed in [place].  CRITICAL CARE Performed by: Lorenza Chick   Total critical care time: 65 minutes  Critical care time was exclusive of separately billable procedures and treating other patients.  Critical care was necessary to treat or prevent imminent or life-threatening deterioration, specifically evaluation for potential acute intervention including tPA or intra-arterial thrombectomy.  Critical care was time spent personally by me on the following activities: development of treatment plan with patient and/or surrogate as well  as nursing, discussions with consultants, evaluation of patient's response to treatment, examination of patient, obtaining history from patient or surrogate, ordering and performing treatments and interventions, ordering and review of laboratory studies, ordering and review of radiographic studies, pulse oximetry and re-evaluation of patient's condition.

## 2021-07-29 NOTE — ED Notes (Signed)
Dr Pearline Cables notified at 1250 of strokelike systems and responded to room

## 2021-07-29 NOTE — ED Notes (Signed)
Report given to [facility]. All questions and concerns addressed at this time. ETA 20 mins.

## 2021-07-29 NOTE — ED Notes (Addendum)
Pt stated that vision was blurred, when using both eyes simultaneously, but able see better, when individual eyes were covered up. Joe - RN informed.

## 2021-07-29 NOTE — Progress Notes (Addendum)
Received a phone call from [facility]   Requesting MD: Dr. Pearline Cables  Patient with h/o PAF, HTN, mitral valve repair on warfarin, prediabetes, HLD presenting with vision changes of monocular diplopia last night and then she had binocular diplopia and could not walk well this AM.  CTH no acute findings, but neurology recommended [facility]. Labs: INR: 2.5. vitals stable.   Plan of care: admit to telemetry, order MRI and let neurology know when she arrives for stroke r/o.  The patient will be accepted for admission to telemetry at [facility] when bed is available.   Nursing staff, Please call the [place] number at the top of Amion at the time of the patient's arrival so that the patient can be paged to the admitting physician.   Casimer Bilis, M.D. [facility]

## 2021-07-30 ENCOUNTER — Observation Stay (HOSPITAL_COMMUNITY): Payer: PPO

## 2021-07-30 ENCOUNTER — Ambulatory Visit: Payer: PPO

## 2021-07-30 DIAGNOSIS — I10 Essential (primary) hypertension: Secondary | ICD-10-CM | POA: Diagnosis not present

## 2021-07-30 DIAGNOSIS — H532 Diplopia: Secondary | ICD-10-CM | POA: Diagnosis not present

## 2021-07-30 DIAGNOSIS — E7800 Pure hypercholesterolemia, unspecified: Secondary | ICD-10-CM

## 2021-07-30 DIAGNOSIS — R2990 Unspecified symptoms and signs involving the nervous system: Secondary | ICD-10-CM

## 2021-07-30 DIAGNOSIS — I480 Paroxysmal atrial fibrillation: Secondary | ICD-10-CM | POA: Diagnosis not present

## 2021-07-30 DIAGNOSIS — Z9889 Other specified postprocedural states: Secondary | ICD-10-CM

## 2021-07-30 DIAGNOSIS — G319 Degenerative disease of nervous system, unspecified: Secondary | ICD-10-CM | POA: Diagnosis not present

## 2021-07-30 DIAGNOSIS — R531 Weakness: Secondary | ICD-10-CM

## 2021-07-30 LAB — LIPID PANEL
Cholesterol: 164 mg/dL (ref 0–200)
HDL: 47 mg/dL (ref >40)
LDL Cholesterol: 95 mg/dL (ref 0–99)
Total CHOL/HDL Ratio: 3.5 RATIO
Triglycerides: 112 mg/dL (ref <150)
VLDL: 22 mg/dL (ref 0–40)

## 2021-07-30 LAB — GLUCOSE, CAPILLARY: Glucose-Capillary: 108 mg/dL — ABNORMAL HIGH (ref 70–99)

## 2021-07-30 LAB — PROTIME-INR
INR: 2.4 — ABNORMAL HIGH (ref 0.8–1.2)
Prothrombin Time: 25.7 seconds — ABNORMAL HIGH (ref 11.4–15.2)

## 2021-07-30 LAB — HEMOGLOBIN A1C
Hgb A1c MFr Bld: 5.6 % (ref 4.8–5.6)
Mean Plasma Glucose: 114.02 mg/dL

## 2021-07-30 MED ORDER — STROKE: EARLY STAGES OF RECOVERY BOOK
Freq: Once | Status: AC
  Filled 2021-07-30: qty 1

## 2021-07-30 MED ORDER — ATORVASTATIN CALCIUM 40 MG PO TABS: 40.0000 mg | ORAL_TABLET | Freq: Every day | ORAL | Status: DC

## 2021-07-30 MED ORDER — ACETAMINOPHEN 325 MG PO TABS
650.0000 mg | ORAL_TABLET | ORAL | Status: DC | PRN
  Administered 2021-07-30: 650 mg via ORAL
  Filled 2021-07-30: qty 2

## 2021-07-30 MED ORDER — AMIODARONE HCL 200 MG PO TABS
200.0000 mg | ORAL_TABLET | Freq: Every day | ORAL | Status: DC
  Administered 2021-07-30: 200 mg via ORAL
  Filled 2021-07-30: qty 1

## 2021-07-30 MED ORDER — GABAPENTIN 100 MG PO CAPS: 200.0000 mg | ORAL_CAPSULE | Freq: Every day | ORAL | Status: DC

## 2021-07-30 MED ORDER — ASPIRIN EC 81 MG PO TBEC
81.0000 mg | DELAYED_RELEASE_TABLET | Freq: Every day | ORAL | Status: DC
  Administered 2021-07-30: 81 mg via ORAL
  Filled 2021-07-30: qty 1

## 2021-07-30 MED ORDER — ACETAMINOPHEN 650 MG RE SUPP: 650.0000 mg | RECTAL | Status: DC | PRN

## 2021-07-30 MED ORDER — ACETAMINOPHEN 160 MG/5ML PO SOLN: 650.0000 mg | ORAL | Status: DC | PRN

## 2021-07-30 MED ORDER — WARFARIN - PHARMACIST DOSING INPATIENT: Freq: Every day | Status: DC

## 2021-07-30 MED ORDER — WARFARIN SODIUM 3 MG PO TABS
3.0000 mg | ORAL_TABLET | Freq: Once | ORAL | Status: DC
  Filled 2021-07-30: qty 1

## 2021-07-30 MED ORDER — ATORVASTATIN CALCIUM 10 MG PO TABS
20.0000 mg | ORAL_TABLET | Freq: Every day | ORAL | Status: DC
  Administered 2021-07-30: 20 mg via ORAL
  Filled 2021-07-30: qty 2

## 2021-07-30 NOTE — Progress Notes (Signed)
Isaac Laud D/C'd Home per MD order.  Discussed with the patient and all questions fully answered. Patient and husband verbalized understanding of all information given to them.  VSS, Skin clean, dry and intact without evidence of skin break down, no evidence of skin tears noted. IV catheter discontinued intact. Site without signs and symptoms of complications. Dressing and pressure applied.  An After Visit Summary was printed and given to the patient. Patient received prescription.  D/c education completed with patient/family including follow up instructions, medication list, d/c activities limitations if indicated, with other d/c instructions as indicated by MD - patient able to verbalize understanding, all questions fully answered.   Patient instructed to return to ED, call 911, or call MD for any changes in condition.   Patient escorted via [place], and D/C home via private auto at 1450.  Dorris Carnes 07/30/2021 1:52 PM

## 2021-07-30 NOTE — Progress Notes (Signed)
Occupational Therapy Evaluation  PTA pt lives independently with her husband. Pt complains of vertical diplopia in L and primary gaze. L nasal portion of lens partially occluded to diminish second image, reduce stress and improve functional vision. Pt reports improvement with use of partial occlusion. Began education regarding progression of partial occlusion and home safety. Recommend pt follow up with her eye doctor to monitor diplopia and refrain from driving until cleared by MD. Will follow acutely however do not anticipate the need for OT follow up.    07/30/21 0959  OT Visit Information  Last OT Received On 07/30/21  Assistance Needed +1  History of Present Illness 75 y.o. female admitted with diplopia. MRI (-)  for CVA, but does show 1.6 cm calcified meningioma overlying the left frontal convexity without significant edema or mass effect. Past medical history significant of HLD, PAF, MVR, on coumadin, osteoporosis, HTN.  Precautions  Precautions Fall  Restrictions  Weight Bearing Restrictions No  Home Living  Family/patient expects to be discharged to: Private residence  Living Arrangements Spouse/significant other  Available Help at Discharge Available 24 hours/day  Type of [place] Access Level entry  Home Layout One level  Bathroom Shower/Tub Tub/shower unit;[place]  Corporate treasurer Yes  How Accessible Accessible via [place] - 2 wheels  Prior Function  Level of Independence Independent  Comments was driving, state she enjoys doing errands, going to church.  Communication  Communication No difficulties  Pain Assessment  Pain Assessment Faces  Faces Pain Scale 0  Pain Intervention(s) Monitored during session  Cognition  Arousal/Alertness Awake/alert  Behavior During Therapy WFL for tasks assessed/performed  Overall Cognitive Status Within Functional Limits for tasks assessed  Upper Extremity Assessment   Upper Extremity Assessment Overall WFL for tasks assessed  Cervical / Trunk Assessment  Cervical / Trunk Assessment Normal  ADL  Overall ADL's  Needs assistance/impaired  Functional mobility during ADLs Min guard;Rolling walker;Cueing for safety  General ADL Comments Overall S for ADL tasks; Pt reports she feels more secure with parital occlusion glasses  Vision- History  Baseline Vision/History 1 Wears glasses (reading)  Patient Visual Report Diplopia  Vision- Assessment  Vision Assessment? Yes  Eye Alignment Impaired (comment)  Ocular Range of Motion Impaired-to be further tested in functional context  Alignment/Gaze Preference Head tilt  Tracking/Visual Pursuits Impaired - to be further tested in functional context  Saccades Decreased speed of saccadic movement  Convergence WFL  Visual Fields No apparent deficits  Diplopia Assessment Disappears with one eye closed;Objects split on top of one another;Only with left gaze;Present in primary gaze; appears to be R eye dominant  Depth Perception Overshoots;Undershoots  Bed Mobility  Overal bed mobility Modified Independent  Transfers  Overall transfer level Needs assistance  Transfers Sit to/from Stand  Sit to Stand Supervision  Balance  Sitting balance-Leahy Scale Good  Standing balance-Leahy Scale Fair  General Comments  General comments (skin integrity, edema, etc.) initially unsteady, ready out to hold objects to steady self. REports she has not "been up much". Better performance with RW  OT - End of Session  Equipment Utilized During Treatment Gait belt;Rolling walker  Activity Tolerance Patient tolerated treatment well  Patient left in bed;with call bell/phone within reach;with bed alarm set;with SCD's reapplied  Nurse Communication Mobility status;Other (comment) (DC needs)  OT Assessment  OT Recommendation/Assessment Patient needs continued OT Services  OT Visit Diagnosis Unsteadiness on feet (R26.81);Low vision, both  eyes (H54.2)  OT Problem List Impaired vision/perception;Impaired balance (sitting and/or standing)  OT Plan  OT Frequency (ACUTE ONLY) Min 2X/week  OT Treatment/Interventions (ACUTE ONLY) Self-care/ADL training;DME and/or AE instruction;Therapeutic activities;Visual/perceptual remediation/compensation;Patient/family education;Balance training  AM-PAC OT "6 Clicks" Daily Activity Outcome Measure (Version 2)  Help from another person eating meals? 4  Help from another person taking care of personal grooming? 3  Help from another person toileting, which includes using toliet, bedpan, or urinal? 3  Help from another person bathing (including washing, rinsing, drying)? 3  Help from another person to put on and taking off regular upper body clothing? 3  Help from another person to put on and taking off regular lower body clothing? 3  6 Click Score 19  Progressive Mobility  What is the highest level of mobility based on the progressive mobility assessment? Level 5 (Walks with assist in room/hall) - Balance while stepping forward/back and can walk in room with assist - Complete  Mobility Out of bed for toileting;Out of bed to chair with meals;Ambulated with assistance in room  OT Recommendation  Recommendations for Other Services Other (comment) (follow up with her eye doctor)  Follow Up Recommendations No OT follow up;Other (comment) (S with ADL and mobility; refrain from driving until cleared by eye doctor)  OT Equipment 3 in 1 bedside commode (to use as shower seat)  Individuals Consulted  Consulted and Agree with Results and Recommendations Patient  Acute Rehab OT Goals  Patient Stated Goal for vision to improve  OT Goal Formulation With patient  Time For Goal Achievement 08/13/21  Potential to Achieve Goals Good  OT Time Calculation  OT Start Time (ACUTE ONLY) 0840  OT Stop Time (ACUTE ONLY) 0910  OT Time Calculation (min) 30 min  OT General Charges  $OT Visit 1 Visit  OT Evaluation   $OT Eval Low Complexity 1 Low  OT Treatments  $Therapeutic Activity 8-22 mins  Written Expression  Dominant Hand Right  Maurie Boettcher, OT/L   Acute OT Clinical Specialist Acute Rehabilitation Services Pager [phone] Office [phone]

## 2021-07-30 NOTE — Progress Notes (Addendum)
STROKE TEAM PROGRESS NOTE   SUBJECTIVE (INTERVAL HISTORY) No family is at the bedside.  Overall her condition is unchanged.  Patient still has vertical diplopia, on eye occluder, MRI negative, but concerning for DWI negative posterior stroke.  INR today 2.4, continue Coumadin.  Per patient, her Coumadin will be for 3 months given recent surgery of MV repair and appendage clipping 2 months ago.  Patient had history of postop A. fib, put on Coumadin and amiodarone.  However maintained sinus since, CT surgery recommended Coumadin only for 3 months but will up to cardiologist Dr. Gwenlyn Found.   OBJECTIVE headaches on I think Temp:  [97.8 F (36.6 C)-99.2 F (37.3 C)] 99.2 F (37.3 C) (09/06 1209) Pulse Rate:  [77-98] 96 (09/06 1209) Cardiac Rhythm: Normal sinus rhythm (09/06 0700) Resp:  [14-36] 14 (09/06 1209) BP: (124-162)/(72-95) 127/88 (09/06 1209) SpO2:  [95 %-100 %] 98 % (09/06 1209)  Recent Labs  Lab 07/29/21 1310 07/30/21 0401  GLUCAP 174* 108*   Recent Labs  Lab 07/29/21 1330  NA 139  K 3.6  CL 103  CO2 27  GLUCOSE 146*  BUN 18  CREATININE 0.99  CALCIUM 9.5   Recent Labs  Lab 07/29/21 1330  AST 20  ALT 14  ALKPHOS 48  BILITOT 0.4  PROT 6.9  ALBUMIN 4.0   Recent Labs  Lab 07/29/21 1330  WBC 4.9  NEUTROABS 3.1  HGB 13.1  HCT 41.1  MCV 96.7  PLT 243   No results for input(s): CKTOTAL, CKMB, CKMBINDEX, TROPONINI in the last 168 hours. Recent Labs    07/29/21 1330 07/30/21 0104  LABPROT 26.8* 25.7*  INR 2.5* 2.4*   Recent Labs    07/29/21 1300  COLORURINE COLORLESS*  LABSPEC 1.023  PHURINE 7.0  GLUCOSEU NEGATIVE  HGBUR MODERATE*  BILIRUBINUR NEGATIVE  KETONESUR NEGATIVE  PROTEINUR NEGATIVE  NITRITE NEGATIVE  LEUKOCYTESUR NEGATIVE       Component Value Date/Time   CHOL 164 07/30/2021 0104   TRIG 112 07/30/2021 0104   TRIG 177 06/14/2010 0000   HDL 47 07/30/2021 0104   CHOLHDL 3.5 07/30/2021 0104   VLDL 22 07/30/2021 0104   LDLCALC 95  07/30/2021 0104   Lab Results  Component Value Date   HGBA1C 5.6 07/30/2021      Component Value Date/Time   LABOPIA NONE DETECTED 07/29/2021 1300   COCAINSCRNUR NONE DETECTED 07/29/2021 1300   LABBENZ NONE DETECTED 07/29/2021 1300   AMPHETMU NONE DETECTED 07/29/2021 1300   THCU NONE DETECTED 07/29/2021 1300   LABBARB NONE DETECTED 07/29/2021 1300    Recent Labs  Lab 07/29/21 1330  ETH <10    I have personally reviewed the radiological images below and agree with the radiology interpretations.  MR BRAIN WO CONTRAST  Result Date: 07/30/2021 CLINICAL DATA:  Initial evaluation for neuro deficit, stroke suspected. EXAM: MRI HEAD WITHOUT CONTRAST TECHNIQUE: Multiplanar, multiecho pulse sequences of the brain and surrounding structures were obtained without intravenous contrast. COMPARISON:  CT from 07/29/2021. FINDINGS: Brain: Cerebral volume within normal limits for age. Scattered patchy T2/FLAIR hyperintensity seen involving the periventricular, deep, and subcortical white matter both cerebral hemispheres, most like related chronic microvascular ischemic disease, moderate in nature. No abnormal foci of restricted diffusion to suggest acute or subacute ischemia. Gray-white matter differentiation maintained. No encephalomalacia to suggest chronic cortical infarction. No acute intracranial hemorrhage. Few scattered punctate chronic micro hemorrhages noted involving both cerebral hemispheres, likely hypertensive in nature. 1.6 cm calcified meningioma overlies the left frontal convexity without  significant edema or mass effect. No other mass lesion, mass effect or midline shift. No hydrocephalus or extra-axial fluid collection. Pituitary gland suprasellar region within normal limits. Midline structures intact and normal. Vascular: Major intracranial vascular flow voids are maintained. Skull and upper cervical spine: Craniocervical junction within normal limits. Bone marrow signal intensity normal. No  scalp soft tissue abnormality Sinuses/Orbits: Prior bilateral ocular lens replacement. Chronic left sphenoid sinusitis noted. Scattered mucosal thickening noted elsewhere throughout the paranasal sinuses. No mastoid effusion. Inner ear structures grossly normal. Other: None. IMPRESSION: 1. No acute intracranial abnormality. 2. Age-related cerebral atrophy with moderate chronic microvascular ischemic disease. 3. 1.6 cm calcified meningioma overlying the left frontal convexity without significant edema or mass effect. Electronically Signed   By: Jeannine Boga M.D.   On: 07/30/2021 03:46   ECHOCARDIOGRAM COMPLETE  Result Date: 07/03/2021    ECHOCARDIOGRAM REPORT   Patient Name:   [name] [name] Date of Exam: 07/03/2021 Medical Rec #:  [id number]          Height:       63.0 in Accession #:    [id number]         Weight:       134.4 lb Date of Birth:  05/08/1946          BSA:          1.633 m Patient Age:    75 years           BP:           112/68 mmHg Patient Gender: F                  HR:           81 bpm. Exam Location:  [address] Procedure: 2D Echo, Cardiac Doppler and Color Doppler Indications:    I05.9 Mitral Valve Disorder  History:        Patient has prior history of Echocardiogram examinations, most                 recent 05/21/2021. Mitral Valve Disease, Arrythmias:PVC; Risk                 Factors:Hypertension, Family History of Coronary Artery Disease                 and Dyslipidemia. Status post Mitral Valve Repair (05-21-21, 59m                 Medrtonic Simuform Ring).  Sonographer:    BDeliah BostonRDCS Referring Phys: [id number] 1. Left ventricular ejection fraction, by estimation, is 40 to 45%. The left ventricle has mildly decreased function. The left ventricle demonstrates global hypokinesis. Left ventricular diastolic function could not be evaluated.  2. Right ventricular systolic function is normal. The right ventricular size is normal.  3. The mitral valve  has been repaired/replaced. No evidence of mitral valve regurgitation. No evidence of mitral stenosis. The mean mitral valve gradient is 3.0 mmHg with average heart rate of 77 bpm. Procedure Date: 05/21/2021.  4. The aortic valve is tricuspid. Aortic valve regurgitation is not visualized. No aortic stenosis is present.  5. The inferior vena cava is normal in size with greater than 50% respiratory variability, suggesting right atrial pressure of 3 mmHg. Comparison(s): Prior images reviewed side by side. The left ventricular function is worsened. FINDINGS  Left Ventricle: Left ventricular ejection fraction, by estimation, is 40 to 45%. The left ventricle has mildly decreased  function. The left ventricle demonstrates global hypokinesis. The left ventricular internal cavity size was normal in size. There is  no left ventricular hypertrophy. Abnormal (paradoxical) septal motion consistent with post-operative status. Left ventricular diastolic function could not be evaluated due to mitral valve repair. Left ventricular diastolic function could not be evaluated. Right Ventricle: The right ventricular size is normal. No increase in right ventricular wall thickness. Right ventricular systolic function is normal. Left Atrium: Left atrial size was normal in size. Right Atrium: Right atrial size was normal in size. Pericardium: There is no evidence of pericardial effusion. Mitral Valve: The mitral valve has been repaired/replaced. There is moderate thickening of the mitral valve leaflet(s). No evidence of mitral valve regurgitation. There is a [employer] annuloplasty ring and neochordae present in the mitral position. No evidence of mitral valve stenosis. MV peak gradient, 8.8 mmHg. The mean mitral valve gradient is 3.0 mmHg with average heart rate of 77 bpm. Tricuspid Valve: The tricuspid valve is normal in structure. Tricuspid valve regurgitation is not demonstrated. No evidence of tricuspid stenosis. Aortic Valve: The aortic  valve is tricuspid. Aortic valve regurgitation is not visualized. No aortic stenosis is present. Pulmonic Valve: The pulmonic valve was normal in structure. Pulmonic valve regurgitation is not visualized. No evidence of pulmonic stenosis. Aorta: The aortic root is normal in size and structure. Venous: The inferior vena cava is normal in size with greater than 50% respiratory variability, suggesting right atrial pressure of 3 mmHg. IAS/Shunts: No atrial level shunt detected by color flow Doppler.  LEFT VENTRICLE PLAX 2D LVIDd:         4.40 cm  Diastology LVIDs:         3.20 cm  LV e' medial:    5.77 cm/s LV PW:         1.00 cm  LV E/e' medial:  21.5 LV IVS:        0.60 cm  LV e' lateral:   6.22 cm/s LVOT diam:     2.30 cm  LV E/e' lateral: 19.9 LV SV:         63 LV SV Index:   39 LVOT Area:     4.15 cm  RIGHT VENTRICLE RV S prime:     11.70 cm/s TAPSE (M-mode): 1.4 cm LEFT ATRIUM             Index       RIGHT ATRIUM           Index LA diam:        3.60 cm 2.20 cm/m  RA Area:     15.20 cm LA Vol (A2C):   63.8 ml 39.07 ml/m RA Volume:   37.70 ml  23.09 ml/m LA Vol (A4C):   46.9 ml 28.72 ml/m LA Biplane Vol: 58.0 ml 35.52 ml/m  AORTIC VALVE LVOT Vmax:   85.00 cm/s LVOT Vmean:  50.550 cm/s LVOT VTI:    0.152 m  AORTA Ao Root diam: 3.00 cm Ao Asc diam:  3.10 cm MITRAL VALVE MV Area (PHT)  cm          SHUNTS MV Area VTI:   1.31 cm     Systemic VTI:  0.15 m MV Peak grad:  8.8 mmHg     Systemic Diam: 2.30 cm MV Mean grad:  3.0 mmHg MV Vmax:       1.48 m/s MV Vmean:      76.2 cm/s MV Decel Time: 376 msec MV E velocity: 124.00 cm/s MV A  velocity: 118.00 cm/s MV E/A ratio:  1.05 Mihai Croitoru MD Electronically signed by Sanda Klein MD Signature Date/Time: 07/03/2021/5:02:04 PM    Final    CT HEAD CODE STROKE WO CONTRAST  Addendum Date: 07/29/2021   ADDENDUM REPORT: 07/29/2021 13:33 ADDENDUM: After several unsuccessful attempts, these results were called by telephone at the time of interpretation on 07/29/2021 at  1:28 pm to provider Wynona Dove , who verbally acknowledged these results. Electronically Signed   By: Kellie Simmering D.O.   On: 07/29/2021 13:33   Result Date: 07/29/2021 CLINICAL DATA:  Code stroke. Neuro deficit, acute, stroke suspected. Additional provided: Blurry vision in left eye, double vision, dizziness, diarrhea, symptoms began 1 week ago. EXAM: CT HEAD WITHOUT CONTRAST TECHNIQUE: Contiguous axial images were obtained from the base of the skull through the vertex without intravenous contrast. COMPARISON:  Head CT 11/15/2019. FINDINGS: Brain: Mild generalized cerebral and cerebellar atrophy. Mild patchy and ill-defined hypoattenuation within the cerebral white matter, nonspecific but compatible with chronic small vessel ischemic disease. 1.5 cm calcified meningioma overlying the left frontal lobe. As before, there is minimal local mass effect upon the underlying left frontal lobe. There is no acute intracranial hemorrhage. No demarcated cortical infarct. No extra-axial fluid collection. No midline shift. Vascular: No hyperdense vessel.  Atherosclerotic calcifications. Skull: Normal. Negative for fracture or focal lesion. Sinuses/Orbits: The left sphenoid sinus is partially opacified with partially calcified material, not simply changed from the prior head CT of 11/15/2019. Mild bilateral ethmoid sinus mucosal thickening. ASPECTS (Elbert Stroke Program Early CT Score) - Ganglionic level infarction (caudate, lentiform nuclei, internal capsule, insula, M1-M3 cortex): 7 - Supraganglionic infarction (M4-M6 cortex): 3 Total score (0-10 with 10 being normal): 10 IMPRESSION: No evidence of acute infarct or acute intracranial hemorrhage. Mild chronic small-vessel ischemic changes within the cerebral white matter. Unchanged 1.5 cm calcified meningioma overlying the left frontal lobe with minimal parenchymal mass effect. Mild generalized parenchymal atrophy. Paranasal sinus disease, as described. Electronically  Signed: By: Kellie Simmering D.O. On: 07/29/2021 13:24   CT ANGIO HEAD NECK W WO CM (CODE STROKE)  Result Date: 07/29/2021 CLINICAL DATA:  Aorta, acute, stroke suspected. Blurry vision. Double vision. EXAM: CT ANGIOGRAPHY HEAD AND NECK TECHNIQUE: Multidetector CT imaging of the head and neck was performed using the standard protocol during bolus administration of intravenous contrast. Multiplanar CT image reconstructions and MIPs were obtained to evaluate the vascular anatomy. Carotid stenosis measurements (when applicable) are obtained utilizing NASCET criteria, using the distal internal carotid diameter as the denominator. CONTRAST:  62m OMNIPAQUE IOHEXOL 350 MG/ML SOLN COMPARISON:  Head CT performed earlier today 07/29/2021. FINDINGS: CTA NECK FINDINGS Aortic arch: Common origin of the innominate and left common carotid arteries. Atherosclerotic plaque within the proximal right subclavian artery. Streak artifact from a dense left-sided contrast bolus partially obscures the left subclavian artery. Within this limitation, there is no appreciable hemodynamically significant stenosis within the innominate or proximal subclavian arteries. Right carotid system: CCA and ICA patent within the neck without stenosis or significant atherosclerotic disease. Left carotid system: CCA and ICA patent within the neck without stenosis or significant atherosclerotic disease. Vertebral arteries: Vertebral arteries codominant and patent within the neck. Minimal nonstenotic atherosclerotic plaque at the origin of the left vertebral artery. Skeleton: Cervical spondylosis. Trace C3-C4 and C4-C5 grade 1 anterolisthesis. Advanced disc space narrowing with degenerative endplate irregularity and degenerative endplate sclerosis at [id number] No acute bony abnormality or aggressive osseous lesion. Other neck: No neck mass or cervical lymphadenopathy. Thyroid unremarkable.  Upper chest: No consolidation within the imaged lung apices. Review of the  MIP images confirms the above findings CTA HEAD FINDINGS Anterior circulation: The intracranial internal carotid arteries are patent. Calcified plaque within both vessels without stenosis. The M1 middle cerebral arteries are patent. Atherosclerotic irregularity of the M2 and more distal MCA vessels bilaterally without M2 proximal branch occlusion or severe proximal arterial stenosis identified. The anterior cerebral arteries are patent. One-two mm endplate projecting vascular protrusion arising from the paraclinoid right ICA, which may reflect an aneurysm or infundibulum. Posterior circulation: The intracranial vertebral arteries are patent. The basilar artery is patent. The posterior cerebral arteries are patent bilaterally without high-grade proximal stenosis. Hypoplastic left P1 segment with sizable left posterior communicating artery. The right posterior communicating artery is hypoplastic or absent. Venous sinuses: Within the limitations of contrast timing, no convincing thrombus. Anatomic variants: As described. Review of the MIP images confirms the above findings No intracranial large vessel occlusion. These results were called by telephone at the time of interpretation on 07/29/2021 at 2:03 pm to provider Wynona Dove , who verbally acknowledged these results. IMPRESSION: CTA neck: The common carotid, internal carotid and vertebral arteries are patent within the neck without stenosis. Non-stenotic atherosclerotic plaque at the origin of the left vertebral artery. CTA head: 1. Intracranial atherosclerotic disease, as described. No intracranial large vessel occlusion or proximal high-grade arterial stenosis. 2. 1-2 mm vascular protrusion arising from the paraclinoid right ICA, which may reflect an aneurysm or infundibulum. Electronically Signed   By: Kellie Simmering D.O.   On: 07/29/2021 14:05     PHYSICAL EXAM  Temp:  [97.8 F (36.6 C)-99.2 F (37.3 C)] 99.2 F (37.3 C) (09/06 1209) Pulse Rate:  [77-98] 96  (09/06 1209) Resp:  [14-36] 14 (09/06 1209) BP: (124-162)/(72-95) 127/88 (09/06 1209) SpO2:  [95 %-100 %] 98 % (09/06 1209)  General - Well nourished, well developed, in no apparent distress.  Ophthalmologic - fundi not visualized due to noncooperation.  Cardiovascular - Regular rhythm and rate.  Mental Status -  Level of arousal and orientation to time, place, and person were intact. Language including expression, naming, repetition, comprehension was assessed and found intact. Fund of Knowledge was assessed and was intact.  Cranial Nerves II - XII - II - Visual field intact OU. III, IV, VI - Extraocular movements intact. However, subjective binocular vertical diplopia V - Facial sensation intact bilaterally. VII - Facial movement intact bilaterally. VIII - Hearing & vestibular intact bilaterally. X - Palate elevates symmetrically. XI - Chin turning & shoulder shrug intact bilaterally. XII - Tongue protrusion intact.  Motor Strength - The patient's strength was normal in all extremities and pronator drift was absent.  Bulk was normal and fasciculations were absent.   Motor Tone - Muscle tone was assessed at the neck and appendages and was normal.  Reflexes - The patient's reflexes were symmetrical in all extremities and she had no pathological reflexes.  Sensory - Light touch, temperature/pinprick were assessed and were symmetrical.    Coordination - The patient had normal movements in the hands and feet with no ataxia or dysmetria.  Tremor was absent.  Gait and Station - deferred.   ASSESSMENT/PLAN [name] is a 75 y.o. female with history of hypertension, hyperlipidemia, left frontal meningioma, migraine, recent MVR and appendage clipping on Coumadin admitted for diplopia. No tPA given due to outside window.    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  INR goal 2-3.  Patient counseled to be compliant with her antithrombotic medications Ongoing aggressive stroke risk factor management Therapy recommendations: None Disposition: Home today  MVR and appendage clipping Procedure done 04/2021 Now on Coumadin for 3 months per patient INR 1.7->...-> 2.5-> 2.4 Continue Coumadin and aspirin 81, INR goal 2-3 Follow-up with cardiology Dr. Gwenlyn Found and cardiovascular surgery  Hypertension Stable Long term BP goal normotensive  Hyperlipidemia Home meds: Lipitor 20 LDL 95, goal < 70 Now on Lipitor 40 Continue statin at discharge  Other Stroke Risk Factors Advanced age Migraines  Other Active Problems Stable left frontal meningioma Initial left eye monocular diplopia - follow-up with ophthalmology, patient aware  Hospital day # 0  Neurology will sign off. Please call with questions. Pt will follow up with Dr. Tomi Likens at [facility] in about 4 weeks. Thanks for the consult.   Rosalin Hawking, MD PhD Stroke Neurology 07/30/2021 3:52 PM    To contact Stroke Continuity provider, please refer to [email or web address] After hours, contact General Neurology

## 2021-07-30 NOTE — Consult Note (Signed)
NEURO HOSPITALIST CONSULT NOTE   Requestig physician: Dr. Cruzita Lederer  Reason for Consult: New onset of binocular diplopia.   History obtained from:   Patient and Chart     HPI:                                                                                                                                          [name] [name] is an 75 y.o. female with a PMHx as documented below, including mitral valve repair (on warfarin), atrial appendage clipping in July of this year, PAF, HLD, HTN, migraine headache and shingles, who presented to the ED on Monday afternoon with new onset of binocular diplopia, which had started at about 4 PM on Sunday. Due to the diplopia, she was having difficulty ambulating. She also endorsed a one week history of intermittent diarrhea, nausea, vomiting and decreased PO intake, but no fevers or chills. She was evaluated via Teleneurology (Dr. Curly Shores) on Monday. CTA of head and neck showed intracranial atherosclerotic disease but no LVO or high grade stenosis. CT head showed chronic microvascular changes. Warfarin was held until MRI could be obtained to confirm no large stroke. She was then transferred to [facility] for further evaluation and MRI. On evaluation by Dr. Alcario Drought during admission H and P, dysconjugate gaze was noted. MRI obtained after arrival to [facility] showed no acute infarction.   Past Medical History:  Diagnosis Date   Arthritis    Atypical chest pain    Diverticulosis    Gastritis 11/08/2007, 04/12/2012   H pylori bx neg 03/2012   GERD (gastroesophageal reflux disease) 11/08/2007, 04/12/2012   Hepatic cyst    Hiatal hernia 11/08/2007, 04/12/2012   History of kidney stones    Hyperlipidemia    Hypertension    IBS (irritable bowel syndrome)    diarrhea predom   Migraine    Mitral regurgitation    Osteoporosis    DEXA 01/21/2012: -2.1 spine and R fem, -1.8 L fem s/p fosamax  2004-2011 DEXA 04/18/14 @ [facility]: -2.5 - rec to start Prolia     PVC's  (premature ventricular contractions)    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   Complex valvuloplasty including PTFE neochord placement x6 with 30 mm Medtronic Simuform ring annuloplasty with clipping of LA appendage   Schatzki's ring 11/11/2010   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   Shingles outbreak 04/2013    Past Surgical History:  Procedure Laterality Date   [place] STUDY  03/14/2021   Procedure: BUBBLE STUDY;  Surgeon: Sueanne Margarita, MD;  Location: [place];  Service: Cardiovascular;;   CARDIAC CATHETERIZATION     CLIPPING OF ATRIAL APPENDAGE N/A 05/21/2021   Procedure: CLIPPING OF ATRIAL APPENDAGE USING ATRICURE 40MM PRO2 CLIP;  Surgeon: Darylene Price  H, MD;  Location: [place];  Service: Open Heart Surgery;  Laterality: N/A;   COLONOSCOPY     CYSTOCELE REPAIR  2008   ESOPHAGOGASTRODUODENOSCOPY     Maxilofacial  1992   MITRAL VALVE REPAIR  05/21/2021   Procedure: MINIMALLY INVASIVE MITRAL VALVE REPAIR (MVR) USING MEDTRONIC SIMUFORM 30MM RING;  Surgeon: Rexene Alberts, MD;  Location: [place];  Service: Open Heart Surgery;;   RIGHT/LEFT HEART CATH AND CORONARY ANGIOGRAPHY N/A 03/14/2021   Procedure: RIGHT/LEFT HEART CATH AND CORONARY ANGIOGRAPHY;  Surgeon: Lorretta Harp, MD;  Location: [place] [facility];  Service: Cardiovascular;  Laterality: N/A;   TEE WITHOUT CARDIOVERSION N/A 03/14/2021   Procedure: TRANSESOPHAGEAL ECHOCARDIOGRAM (TEE);  Surgeon: Sueanne Margarita, MD;  Location: [facility] ENDOSCOPY;  Service: Cardiovascular;  Laterality: N/A;   TEE WITHOUT CARDIOVERSION N/A 05/21/2021   Procedure: TRANSESOPHAGEAL ECHOCARDIOGRAM (TEE);  Surgeon: Rexene Alberts, MD;  Location: [place];  Service: Open Heart Surgery;  Laterality: N/A;   TONSILLECTOMY  1960    Family History  Problem Relation Age of Onset   Hypertension Mother    Alzheimer's disease Mother 44   AAA (abdominal aortic aneurysm) Mother    Stroke Father 54   Kidney cancer  Brother        mets   Bone cancer Brother 11   Colon cancer Neg Hx              Social History:  reports that she has never smoked. She has never used smokeless tobacco. She reports that she does not drink alcohol and does not use drugs.  No Known Allergies  HOME MEDICATIONS:                                                                                                                      No current facility-administered medications on file prior to encounter.   Current Outpatient Medications on File Prior to Encounter  Medication Sig Dispense Refill   acetaminophen (TYLENOL) 500 MG tablet Take 500 mg by mouth every 4 (four) hours as needed for mild pain.     aspirin EC 81 MG EC tablet Take 1 tablet (81 mg total) by mouth daily. Swallow whole. 30 tablet 11   atorvastatin (LIPITOR) 20 MG tablet Take 20 mg by mouth daily.     furosemide (LASIX) 20 MG tablet Take 1 tablet (20 mg total) by mouth daily as needed (for weight gain of 3 lbs or  more). 30 tablet    gabapentin (NEURONTIN) 100 MG capsule Take 1 capsule (100 mg total) by mouth at bedtime. (Patient taking differently: Take 200 mg by mouth at bedtime.)     metoprolol tartrate (LOPRESSOR) 25 MG tablet Take 0.5 tablets (12.5 mg total) by mouth 2 (two) times daily. 60 tablet 3   potassium chloride SA (KLOR-CON) 20 MEQ tablet 1/2 tablet daily on days you take Lasix     warfarin (COUMADIN) 2 MG tablet Take 1-2 tablets Daily or as prescribed by Clinic (  Patient taking differently: Take 3 mg by mouth daily. At 1600) 60 tablet 1   amiodarone (PACERONE) 200 MG tablet Take 1 tablet (200 mg total) by mouth daily. X 7 days, then decrease to 200 mg ( 1 tab) daily (Patient not taking: No sig reported) 60 tablet 1     ROS:                                                                                                                                       No limb weakness, limb numbness, incoordination, falls, language difficulty or cognitive  changes. No change in monocular visual acuity of either eye. Other ROS as per HPI.    Blood pressure (!) 147/88, pulse 98, temperature 98 F (36.7 C), temperature source Oral, resp. rate 16, height '5\' 4"'$  (1.626 m), weight 59.4 kg, SpO2 98 %.   General Examination:                                                                                                       Physical Exam  HEENT-  [place]/AT  Lungs- Respirations unlabored  Neurological Examination Mental Status: Awake and alert. Thought content appropriate.  Speech fluent without evidence of aphasia.  Able to follow all commands without difficulty. Cranial Nerves: II: PERRL. Visual acuity in each eye is at her baseline. Has binocular double vision with forward gaze at close range. Fixates and tracks without difficulty.   III,IV, VI: Observation of EOM reveals full movement in all directions without visible skew deviation, exotropia or esotropia. However, there is subjective vertical binocular diplopia with forward gaze at close range that resolves when covering either eye. There is no subjective diplopia with rightward gaze, upward gaze, but there is mild horizontal diplopia with downward gaze. Diplopia is worst with left lateral gaze. When viewing objects directly in front of her with midline gaze, diplopia is worse at close range and improves with increasing distance. No nystagmus.  V,VII: Smile symmetric, facial temp sensation equal bilaterally VIII: hearing intact to voice IX,X: No hypophonia XI: Symmetric shoulder shrug XII: Midline tongue extension Motor: Right : Upper extremity   5/5    Left:     Upper extremity   5/5  Lower extremity   5/5     Lower extremity   5/5 Sensory: Temp sensation intact throughout, bilaterally Deep Tendon Reflexes: 2+ and symmetric throughout Cerebellar: No ataxia with FNF bilaterally  Gait: Deferred   Lab Results: Basic Metabolic Panel: Recent Labs  Lab 07/29/21 1330  NA 139  K 3.6  CL 103   CO2 27  GLUCOSE 146*  BUN 18  CREATININE 0.99  CALCIUM 9.5    CBC: Recent Labs  Lab 07/29/21 1330  WBC 4.9  NEUTROABS 3.1  HGB 13.1  HCT 41.1  MCV 96.7  PLT 243    Cardiac Enzymes: No results for input(s): CKTOTAL, CKMB, CKMBINDEX, TROPONINI in the last 168 hours.  Lipid Panel: Recent Labs  Lab 07/30/21 0104  CHOL 164  TRIG 112  HDL 47  CHOLHDL 3.5  VLDL 22  LDLCALC 95    Imaging: MR BRAIN WO CONTRAST  Result Date: 07/30/2021 CLINICAL DATA:  Initial evaluation for neuro deficit, stroke suspected. EXAM: MRI HEAD WITHOUT CONTRAST TECHNIQUE: Multiplanar, multiecho pulse sequences of the brain and surrounding structures were obtained without intravenous contrast. COMPARISON:  CT from 07/29/2021. FINDINGS: Brain: Cerebral volume within normal limits for age. Scattered patchy T2/FLAIR hyperintensity seen involving the periventricular, deep, and subcortical white matter both cerebral hemispheres, most like related chronic microvascular ischemic disease, moderate in nature. No abnormal foci of restricted diffusion to suggest acute or subacute ischemia. Gray-white matter differentiation maintained. No encephalomalacia to suggest chronic cortical infarction. No acute intracranial hemorrhage. Few scattered punctate chronic micro hemorrhages noted involving both cerebral hemispheres, likely hypertensive in nature. 1.6 cm calcified meningioma overlies the left frontal convexity without significant edema or mass effect. No other mass lesion, mass effect or midline shift. No hydrocephalus or extra-axial fluid collection. Pituitary gland suprasellar region within normal limits. Midline structures intact and normal. Vascular: Major intracranial vascular flow voids are maintained. Skull and upper cervical spine: Craniocervical junction within normal limits. Bone marrow signal intensity normal. No scalp soft tissue abnormality Sinuses/Orbits: Prior bilateral ocular lens replacement. Chronic left  sphenoid sinusitis noted. Scattered mucosal thickening noted elsewhere throughout the paranasal sinuses. No mastoid effusion. Inner ear structures grossly normal. Other: None. IMPRESSION: 1. No acute intracranial abnormality. 2. Age-related cerebral atrophy with moderate chronic microvascular ischemic disease. 3. 1.6 cm calcified meningioma overlying the left frontal convexity without significant edema or mass effect. Electronically Signed   By: Jeannine Boga M.D.   On: 07/30/2021 03:46   CT HEAD CODE STROKE WO CONTRAST  Addendum Date: 07/29/2021   ADDENDUM REPORT: 07/29/2021 13:33 ADDENDUM: After several unsuccessful attempts, these results were called by telephone at the time of interpretation on 07/29/2021 at 1:28 pm to provider Wynona Dove , who verbally acknowledged these results. Electronically Signed   By: Kellie Simmering D.O.   On: 07/29/2021 13:33   Result Date: 07/29/2021 CLINICAL DATA:  Code stroke. Neuro deficit, acute, stroke suspected. Additional provided: Blurry vision in left eye, double vision, dizziness, diarrhea, symptoms began 1 week ago. EXAM: CT HEAD WITHOUT CONTRAST TECHNIQUE: Contiguous axial images were obtained from the base of the skull through the vertex without intravenous contrast. COMPARISON:  Head CT 11/15/2019. FINDINGS: Brain: Mild generalized cerebral and cerebellar atrophy. Mild patchy and ill-defined hypoattenuation within the cerebral white matter, nonspecific but compatible with chronic small vessel ischemic disease. 1.5 cm calcified meningioma overlying the left frontal lobe. As before, there is minimal local mass effect upon the underlying left frontal lobe. There is no acute intracranial hemorrhage. No demarcated cortical infarct. No extra-axial fluid collection. No midline shift. Vascular: No hyperdense vessel.  Atherosclerotic calcifications. Skull: Normal. Negative for fracture or focal lesion. Sinuses/Orbits: The left sphenoid sinus is partially opacified with  partially calcified material, not simply changed from the prior head CT of 11/15/2019.  Mild bilateral ethmoid sinus mucosal thickening. ASPECTS ([place] Stroke Program Early CT Score) - Ganglionic level infarction (caudate, lentiform nuclei, internal capsule, insula, M1-M3 cortex): 7 - Supraganglionic infarction (M4-M6 cortex): 3 Total score (0-10 with 10 being normal): 10 IMPRESSION: No evidence of acute infarct or acute intracranial hemorrhage. Mild chronic small-vessel ischemic changes within the cerebral white matter. Unchanged 1.5 cm calcified meningioma overlying the left frontal lobe with minimal parenchymal mass effect. Mild generalized parenchymal atrophy. Paranasal sinus disease, as described. Electronically Signed: By: Kellie Simmering D.O. On: 07/29/2021 13:24   CT ANGIO HEAD NECK W WO CM (CODE STROKE)  Result Date: 07/29/2021 CLINICAL DATA:  Aorta, acute, stroke suspected. Blurry vision. Double vision. EXAM: CT ANGIOGRAPHY HEAD AND NECK TECHNIQUE: Multidetector CT imaging of the head and neck was performed using the standard protocol during bolus administration of intravenous contrast. Multiplanar CT image reconstructions and MIPs were obtained to evaluate the vascular anatomy. Carotid stenosis measurements (when applicable) are obtained utilizing NASCET criteria, using the distal internal carotid diameter as the denominator. CONTRAST:  56m OMNIPAQUE IOHEXOL 350 MG/ML SOLN COMPARISON:  Head CT performed earlier today 07/29/2021. FINDINGS: CTA NECK FINDINGS Aortic arch: Common origin of the innominate and left common carotid arteries. Atherosclerotic plaque within the proximal right subclavian artery. Streak artifact from a dense left-sided contrast bolus partially obscures the left subclavian artery. Within this limitation, there is no appreciable hemodynamically significant stenosis within the innominate or proximal subclavian arteries. Right carotid system: CCA and ICA patent within the neck without  stenosis or significant atherosclerotic disease. Left carotid system: CCA and ICA patent within the neck without stenosis or significant atherosclerotic disease. Vertebral arteries: Vertebral arteries codominant and patent within the neck. Minimal nonstenotic atherosclerotic plaque at the origin of the left vertebral artery. Skeleton: Cervical spondylosis. Trace C3-C4 and C4-C5 grade 1 anterolisthesis. Advanced disc space narrowing with degenerative endplate irregularity and degenerative endplate sclerosis at [id number] No acute bony abnormality or aggressive osseous lesion. Other neck: No neck mass or cervical lymphadenopathy. Thyroid unremarkable. Upper chest: No consolidation within the imaged lung apices. Review of the MIP images confirms the above findings CTA HEAD FINDINGS Anterior circulation: The intracranial internal carotid arteries are patent. Calcified plaque within both vessels without stenosis. The M1 middle cerebral arteries are patent. Atherosclerotic irregularity of the M2 and more distal MCA vessels bilaterally without M2 proximal branch occlusion or severe proximal arterial stenosis identified. The anterior cerebral arteries are patent. One-two mm endplate projecting vascular protrusion arising from the paraclinoid right ICA, which may reflect an aneurysm or infundibulum. Posterior circulation: The intracranial vertebral arteries are patent. The basilar artery is patent. The posterior cerebral arteries are patent bilaterally without high-grade proximal stenosis. Hypoplastic left P1 segment with sizable left posterior communicating artery. The right posterior communicating artery is hypoplastic or absent. Venous sinuses: Within the limitations of contrast timing, no convincing thrombus. Anatomic variants: As described. Review of the MIP images confirms the above findings No intracranial large vessel occlusion. These results were called by telephone at the time of interpretation on 07/29/2021 at 2:03 pm  to provider SWynona Dove, who verbally acknowledged these results. IMPRESSION: CTA neck: The common carotid, internal carotid and vertebral arteries are patent within the neck without stenosis. Non-stenotic atherosclerotic plaque at the origin of the left vertebral artery. CTA head: 1. Intracranial atherosclerotic disease, as described. No intracranial large vessel occlusion or proximal high-grade arterial stenosis. 2. 1-2 mm vascular protrusion arising from the paraclinoid right ICA, which may reflect an aneurysm  or infundibulum. Electronically Signed   By: Kellie Simmering D.O.   On: 07/29/2021 14:05    MRI brain: Age-related cerebral atrophy with moderate chronic microvascular ischemic disease. 1.6 cm calcified meningioma overlying the left frontal convexity without significant edema or mass effect.    Assessment: 75 year old female presenting with binocular diplopia.  1. MRI shows no detectable stroke or other imaged abnormality to account for the diplopia.  2. CTA head show intracranial atherosclerotic disease. There is a 1-2 mm vascular protrusion arising from the paraclinoid right ICA, which may reflect an aneurysm or infundibulum. However, there is no aneurysm impinging on the course of the 3rd, 4th or 6th cranial nerves.  3. CTA neck: Non-stenotic atherosclerotic plaque at the origin of the left vertebral artery. Otherwise unremarkable.  4. Exam reveals findings best localizable as a right medial rectus palsy. See details in exam above, including mild diplopia with central gaze which is worse at short distances as well as horizontal diplopia when gazing to the left. DDx includes microvascular infarction of the blood supply to a branch of the right 3rd cranial nerve (aka microvascular cranial nerve palsy or MCNP) versus less likely a tiny brainstem ischemic infarction within the right 3rd cranial nerve nucleus that was not detected by MRI.   Recommendations: 1. Continue warfarin 2. Outpatient  Neuro-Ophthalmology follow up with Dr. Michel Bickers of [facility].  3. Other recommendations per Dr. Phoebe Sharps note.    Electronically signed: Dr. Kerney Elbe 07/30/2021, 9:51 AM

## 2021-07-30 NOTE — TOC Transition Note (Signed)
Transition of Care [facility]) - CM/SW Discharge Note   Patient Details  Name: [name] [name] MRN: [id number] Date of Birth: 10-May-1946  [facility] CM/SW Contact:  Pollie Friar, RN Phone Number: 07/30/2021, 2:33 PM   Clinical Narrative:    Patient discharging home today with self care and continuation of cardiac outpatient rehab. Pt lives at home with her spouse. She has been attending cardiac outpatient rehab and wants to continue with them. Spouse providing transport home.    Final next level of care: Home/Self Care Barriers to Discharge: No Barriers Identified   Patient Goals and CMS Choice        Discharge Placement                       Discharge Plan and Services                                     Social Determinants of Health (SDOH) Interventions     Readmission Risk Interventions Readmission Risk Prevention Plan 05/26/2021  Transportation Screening Complete  PCP or Specialist Appt within 5-7 Days Complete  Home Care Screening Complete  Medication Review (RN CM) Complete  Some recent data might be hidden

## 2021-07-30 NOTE — Discharge Summary (Addendum)
Physician Discharge Summary  [name] [id number] DOB: Oct 17, 1946 DOA: 07/29/2021  PCP: Binnie Rail, MD  Admit date: 07/29/2021 Discharge date: 07/30/2021  Admitted From: home Disposition:  home  Recommendations for Outpatient Follow-up:  Follow up with PCP in 1-2 weeks Follow up with Neuro opththalmology as recommended by neuro  Home Health: none Equipment/Devices: none  Discharge Condition: stable CODE STATUS: Full code Diet recommendation: heart healthy  HPI: Per admitting MD, [name] [name] is a 75 y.o. female with medical history significant of HLD, PAF, MVR, on coumadin. Pt presents to ED with c/o waking from sleep this AM with diplopia. Pt has diplopia when both eyes are open, does not have diplopia when one eye is closed. Symptoms constant, persistent, unchanged since onset. No fevers, chills, sweats. Has had some generalized weakness with difficulty ambulating.  Hospital Course / Discharge diagnoses: Principal problem New onset vertical binocular diplopia-patient was admitted to the hospital with vision changes which are new onset.  Neurology consulted and followed patient while hospitalized.  An MRI of the brain was done and it was negative for acute CVA.  Discussed with Dr. Erlinda Hong with neurology, he believes he may have been a very small stroke DWI negative.  She has persistent symptoms.  She was advised not to drive until seen by neuro-ophthalmology.  She is to continue her Coumadin as well as aspirin as prior to admission.  Active problems Paroxysmal A. fib-continue metoprolol, Coumadin, amiodarone Essential hypertension-resume home medications  Sepsis ruled out   Discharge Instructions   Allergies as of 07/30/2021   No Known Allergies      Medication List     TAKE these medications    acetaminophen 500 MG tablet Commonly known as: TYLENOL Take 500 mg by mouth every 4 (four) hours as needed for mild pain.   amiodarone 200 MG tablet Commonly  known as: PACERONE Take 1 tablet (200 mg total) by mouth daily. X 7 days, then decrease to 200 mg ( 1 tab) daily   aspirin 81 MG EC tablet Take 1 tablet (81 mg total) by mouth daily. Swallow whole.   atorvastatin 20 MG tablet Commonly known as: LIPITOR Take 20 mg by mouth daily.   furosemide 20 MG tablet Commonly known as: LASIX Take 1 tablet (20 mg total) by mouth daily as needed (for weight gain of 3 lbs or  more).   gabapentin 100 MG capsule Commonly known as: NEURONTIN Take 1 capsule (100 mg total) by mouth at bedtime. What changed: how much to take   metoprolol tartrate 25 MG tablet Commonly known as: LOPRESSOR Take 0.5 tablets (12.5 mg total) by mouth 2 (two) times daily.   potassium chloride SA 20 MEQ tablet Commonly known as: KLOR-CON 1/2 tablet daily on days you take Lasix   warfarin 2 MG tablet Commonly known as: COUMADIN Take as directed. If you are unsure how to take this medication, talk to your nurse or doctor. Original instructions: Take 1-2 tablets Daily or as prescribed by Clinic What changed:  how much to take how to take this when to take this additional instructions        Follow-up Information     Jennelle Human, MD Follow up.   Specialty: Ophthalmology Contact information: [address] [place] [address] [phone]                 Consultations: Neurology  Procedures/Studies:  MR BRAIN WO CONTRAST  Result Date: 07/30/2021 CLINICAL DATA:  Initial evaluation  for neuro deficit, stroke suspected. EXAM: MRI HEAD WITHOUT CONTRAST TECHNIQUE: Multiplanar, multiecho pulse sequences of the brain and surrounding structures were obtained without intravenous contrast. COMPARISON:  CT from 07/29/2021. FINDINGS: Brain: Cerebral volume within normal limits for age. Scattered patchy T2/FLAIR hyperintensity seen involving the periventricular, deep, and subcortical white matter both cerebral hemispheres, most like related chronic microvascular  ischemic disease, moderate in nature. No abnormal foci of restricted diffusion to suggest acute or subacute ischemia. Gray-white matter differentiation maintained. No encephalomalacia to suggest chronic cortical infarction. No acute intracranial hemorrhage. Few scattered punctate chronic micro hemorrhages noted involving both cerebral hemispheres, likely hypertensive in nature. 1.6 cm calcified meningioma overlies the left frontal convexity without significant edema or mass effect. No other mass lesion, mass effect or midline shift. No hydrocephalus or extra-axial fluid collection. Pituitary gland suprasellar region within normal limits. Midline structures intact and normal. Vascular: Major intracranial vascular flow voids are maintained. Skull and upper cervical spine: Craniocervical junction within normal limits. Bone marrow signal intensity normal. No scalp soft tissue abnormality Sinuses/Orbits: Prior bilateral ocular lens replacement. Chronic left sphenoid sinusitis noted. Scattered mucosal thickening noted elsewhere throughout the paranasal sinuses. No mastoid effusion. Inner ear structures grossly normal. Other: None. IMPRESSION: 1. No acute intracranial abnormality. 2. Age-related cerebral atrophy with moderate chronic microvascular ischemic disease. 3. 1.6 cm calcified meningioma overlying the left frontal convexity without significant edema or mass effect. Electronically Signed   By: Jeannine Boga M.D.   On: 07/30/2021 03:46   ECHOCARDIOGRAM COMPLETE  Result Date: 07/03/2021    ECHOCARDIOGRAM REPORT   Patient Name:   [name] [name] Date of Exam: 07/03/2021 Medical Rec #:  [id number]          Height:       63.0 in Accession #:    [id number]         Weight:       134.4 lb Date of Birth:  Mar 20, 1946          BSA:          1.633 m Patient Age:    35 years           BP:           112/68 mmHg Patient Gender: F                  HR:           81 bpm. Exam Location:  [address] Procedure: 2D Echo,  Cardiac Doppler and Color Doppler Indications:    I05.9 Mitral Valve Disorder  History:        Patient has prior history of Echocardiogram examinations, most                 recent 05/21/2021. Mitral Valve Disease, Arrythmias:PVC; Risk                 Factors:Hypertension, Family History of Coronary Artery Disease                 and Dyslipidemia. Status post Mitral Valve Repair (05-21-21, 88m                 Medrtonic Simuform Ring).  Sonographer:    BDeliah BostonRDCS Referring Phys: 3[place] 1. Left ventricular ejection fraction, by estimation, is 40 to 45%. The left ventricle has mildly decreased function. The left ventricle demonstrates global hypokinesis. Left ventricular diastolic function could not be evaluated.  2. Right ventricular systolic function is  normal. The right ventricular size is normal.  3. The mitral valve has been repaired/replaced. No evidence of mitral valve regurgitation. No evidence of mitral stenosis. The mean mitral valve gradient is 3.0 mmHg with average heart rate of 77 bpm. Procedure Date: 05/21/2021.  4. The aortic valve is tricuspid. Aortic valve regurgitation is not visualized. No aortic stenosis is present.  5. The inferior vena cava is normal in size with greater than 50% respiratory variability, suggesting right atrial pressure of 3 mmHg. Comparison(s): Prior images reviewed side by side. The left ventricular function is worsened. FINDINGS  Left Ventricle: Left ventricular ejection fraction, by estimation, is 40 to 45%. The left ventricle has mildly decreased function. The left ventricle demonstrates global hypokinesis. The left ventricular internal cavity size was normal in size. There is  no left ventricular hypertrophy. Abnormal (paradoxical) septal motion consistent with post-operative status. Left ventricular diastolic function could not be evaluated due to mitral valve repair. Left ventricular diastolic function could not be evaluated. Right  Ventricle: The right ventricular size is normal. No increase in right ventricular wall thickness. Right ventricular systolic function is normal. Left Atrium: Left atrial size was normal in size. Right Atrium: Right atrial size was normal in size. Pericardium: There is no evidence of pericardial effusion. Mitral Valve: The mitral valve has been repaired/replaced. There is moderate thickening of the mitral valve leaflet(s). No evidence of mitral valve regurgitation. There is a [employer] annuloplasty ring and neochordae present in the mitral position. No evidence of mitral valve stenosis. MV peak gradient, 8.8 mmHg. The mean mitral valve gradient is 3.0 mmHg with average heart rate of 77 bpm. Tricuspid Valve: The tricuspid valve is normal in structure. Tricuspid valve regurgitation is not demonstrated. No evidence of tricuspid stenosis. Aortic Valve: The aortic valve is tricuspid. Aortic valve regurgitation is not visualized. No aortic stenosis is present. Pulmonic Valve: The pulmonic valve was normal in structure. Pulmonic valve regurgitation is not visualized. No evidence of pulmonic stenosis. Aorta: The aortic root is normal in size and structure. Venous: The inferior vena cava is normal in size with greater than 50% respiratory variability, suggesting right atrial pressure of 3 mmHg. IAS/Shunts: No atrial level shunt detected by color flow Doppler.  LEFT VENTRICLE PLAX 2D LVIDd:         4.40 cm  Diastology LVIDs:         3.20 cm  LV e' medial:    5.77 cm/s LV PW:         1.00 cm  LV E/e' medial:  21.5 LV IVS:        0.60 cm  LV e' lateral:   6.22 cm/s LVOT diam:     2.30 cm  LV E/e' lateral: 19.9 LV SV:         63 LV SV Index:   39 LVOT Area:     4.15 cm  RIGHT VENTRICLE RV S prime:     11.70 cm/s TAPSE (M-mode): 1.4 cm LEFT ATRIUM             Index       RIGHT ATRIUM           Index LA diam:        3.60 cm 2.20 cm/m  RA Area:     15.20 cm LA Vol (A2C):   63.8 ml 39.07 ml/m RA Volume:   37.70 ml  23.09 ml/m LA  Vol (A4C):   46.9 ml 28.72 ml/m LA Biplane Vol: 58.0 ml 35.52  ml/m  AORTIC VALVE LVOT Vmax:   85.00 cm/s LVOT Vmean:  50.550 cm/s LVOT VTI:    0.152 m  AORTA Ao Root diam: 3.00 cm Ao Asc diam:  3.10 cm MITRAL VALVE MV Area (PHT)  cm          SHUNTS MV Area VTI:   1.31 cm     Systemic VTI:  0.15 m MV Peak grad:  8.8 mmHg     Systemic Diam: 2.30 cm MV Mean grad:  3.0 mmHg MV Vmax:       1.48 m/s MV Vmean:      76.2 cm/s MV Decel Time: 376 msec MV E velocity: 124.00 cm/s MV A velocity: 118.00 cm/s MV E/A ratio:  1.05 Mihai Croitoru MD Electronically signed by Sanda Klein MD Signature Date/Time: 07/03/2021/5:02:04 PM    Final    CT HEAD CODE STROKE WO CONTRAST  Addendum Date: 07/29/2021   ADDENDUM REPORT: 07/29/2021 13:33 ADDENDUM: After several unsuccessful attempts, these results were called by telephone at the time of interpretation on 07/29/2021 at 1:28 pm to provider Wynona Dove , who verbally acknowledged these results. Electronically Signed   By: Kellie Simmering D.O.   On: 07/29/2021 13:33   Result Date: 07/29/2021 CLINICAL DATA:  Code stroke. Neuro deficit, acute, stroke suspected. Additional provided: Blurry vision in left eye, double vision, dizziness, diarrhea, symptoms began 1 week ago. EXAM: CT HEAD WITHOUT CONTRAST TECHNIQUE: Contiguous axial images were obtained from the base of the skull through the vertex without intravenous contrast. COMPARISON:  Head CT 11/15/2019. FINDINGS: Brain: Mild generalized cerebral and cerebellar atrophy. Mild patchy and ill-defined hypoattenuation within the cerebral white matter, nonspecific but compatible with chronic small vessel ischemic disease. 1.5 cm calcified meningioma overlying the left frontal lobe. As before, there is minimal local mass effect upon the underlying left frontal lobe. There is no acute intracranial hemorrhage. No demarcated cortical infarct. No extra-axial fluid collection. No midline shift. Vascular: No hyperdense vessel.  Atherosclerotic  calcifications. Skull: Normal. Negative for fracture or focal lesion. Sinuses/Orbits: The left sphenoid sinus is partially opacified with partially calcified material, not simply changed from the prior head CT of 11/15/2019. Mild bilateral ethmoid sinus mucosal thickening. ASPECTS ([place] Stroke Program Early CT Score) - Ganglionic level infarction (caudate, lentiform nuclei, internal capsule, insula, M1-M3 cortex): 7 - Supraganglionic infarction (M4-M6 cortex): 3 Total score (0-10 with 10 being normal): 10 IMPRESSION: No evidence of acute infarct or acute intracranial hemorrhage. Mild chronic small-vessel ischemic changes within the cerebral white matter. Unchanged 1.5 cm calcified meningioma overlying the left frontal lobe with minimal parenchymal mass effect. Mild generalized parenchymal atrophy. Paranasal sinus disease, as described. Electronically Signed: By: Kellie Simmering D.O. On: 07/29/2021 13:24   CT ANGIO HEAD NECK W WO CM (CODE STROKE)  Result Date: 07/29/2021 CLINICAL DATA:  Aorta, acute, stroke suspected. Blurry vision. Double vision. EXAM: CT ANGIOGRAPHY HEAD AND NECK TECHNIQUE: Multidetector CT imaging of the head and neck was performed using the standard protocol during bolus administration of intravenous contrast. Multiplanar CT image reconstructions and MIPs were obtained to evaluate the vascular anatomy. Carotid stenosis measurements (when applicable) are obtained utilizing NASCET criteria, using the distal internal carotid diameter as the denominator. CONTRAST:  53m OMNIPAQUE IOHEXOL 350 MG/ML SOLN COMPARISON:  Head CT performed earlier today 07/29/2021. FINDINGS: CTA NECK FINDINGS Aortic arch: Common origin of the innominate and left common carotid arteries. Atherosclerotic plaque within the proximal right subclavian artery. Streak artifact from a dense left-sided contrast bolus partially  obscures the left subclavian artery. Within this limitation, there is no appreciable hemodynamically  significant stenosis within the innominate or proximal subclavian arteries. Right carotid system: CCA and ICA patent within the neck without stenosis or significant atherosclerotic disease. Left carotid system: CCA and ICA patent within the neck without stenosis or significant atherosclerotic disease. Vertebral arteries: Vertebral arteries codominant and patent within the neck. Minimal nonstenotic atherosclerotic plaque at the origin of the left vertebral artery. Skeleton: Cervical spondylosis. Trace C3-C4 and C4-C5 grade 1 anterolisthesis. Advanced disc space narrowing with degenerative endplate irregularity and degenerative endplate sclerosis at [id number]. No acute bony abnormality or aggressive osseous lesion. Other neck: No neck mass or cervical lymphadenopathy. Thyroid unremarkable. Upper chest: No consolidation within the imaged lung apices. Review of the MIP images confirms the above findings CTA HEAD FINDINGS Anterior circulation: The intracranial internal carotid arteries are patent. Calcified plaque within both vessels without stenosis. The M1 middle cerebral arteries are patent. Atherosclerotic irregularity of the M2 and more distal MCA vessels bilaterally without M2 proximal branch occlusion or severe proximal arterial stenosis identified. The anterior cerebral arteries are patent. One-two mm endplate projecting vascular protrusion arising from the paraclinoid right ICA, which may reflect an aneurysm or infundibulum. Posterior circulation: The intracranial vertebral arteries are patent. The basilar artery is patent. The posterior cerebral arteries are patent bilaterally without high-grade proximal stenosis. Hypoplastic left P1 segment with sizable left posterior communicating artery. The right posterior communicating artery is hypoplastic or absent. Venous sinuses: Within the limitations of contrast timing, no convincing thrombus. Anatomic variants: As described. Review of the MIP images confirms the above  findings No intracranial large vessel occlusion. These results were called by telephone at the time of interpretation on 07/29/2021 at 2:03 pm to provider Wynona Dove , who verbally acknowledged these results. IMPRESSION: CTA neck: The common carotid, internal carotid and vertebral arteries are patent within the neck without stenosis. Non-stenotic atherosclerotic plaque at the origin of the left vertebral artery. CTA head: 1. Intracranial atherosclerotic disease, as described. No intracranial large vessel occlusion or proximal high-grade arterial stenosis. 2. 1-2 mm vascular protrusion arising from the paraclinoid right ICA, which may reflect an aneurysm or infundibulum. Electronically Signed   By: Kellie Simmering D.O.   On: 07/29/2021 14:05     Subjective: - no chest pain, shortness of breath, no abdominal pain, nausea or vomiting.   Discharge Exam: BP 127/88 (BP Location: Right Arm)   Pulse 96   Temp 99.2 F (37.3 C) (Oral)   Resp 14   Ht '5\' 4"'$  (1.626 m)   Wt 59.4 kg   SpO2 98%   BMI 22.49 kg/m   General: Pt is alert, awake, not in acute distress Cardiovascular: RRR, S1/S2 +, no rubs, no gallops Respiratory: CTA bilaterally, no wheezing, no rhonchi Abdominal: Soft, NT, ND, bowel sounds + Extremities: no edema, no cyanosis    The results of significant diagnostics from this hospitalization (including imaging, microbiology, ancillary and laboratory) are listed below for reference.     Microbiology: Recent Results (from the past 240 hour(s))  Resp Panel by RT-PCR (Flu A&B, Covid) Nasopharyngeal Swab     Status: None   Collection Time: 07/29/21  1:00 PM   Specimen: Nasopharyngeal Swab; Nasopharyngeal(NP) swabs in vial transport medium  Result Value Ref Range Status   SARS Coronavirus 2 by RT PCR NEGATIVE NEGATIVE Final    Comment: (NOTE) SARS-CoV-2 target nucleic acids are NOT DETECTED.  The SARS-CoV-2 RNA is generally detectable in upper respiratory specimens  during the acute phase  of infection. The lowest concentration of SARS-CoV-2 viral copies this assay can detect is 138 copies/mL. A negative result does not preclude SARS-Cov-2 infection and should not be used as the sole basis for treatment or other patient management decisions. A negative result may occur with  improper specimen collection/handling, submission of specimen other than nasopharyngeal swab, presence of viral mutation(s) within the areas targeted by this assay, and inadequate number of viral copies(<138 copies/mL). A negative result must be combined with clinical observations, patient history, and epidemiological information. The expected result is Negative.  Fact Sheet for Patients:  EntrepreneurPulse.com.au  Fact Sheet for Healthcare Providers:  IncredibleEmployment.be  This test is no t yet approved or cleared by the [place] FDA and  has been authorized for detection and/or diagnosis of SARS-CoV-2 by FDA under an Emergency Use Authorization (EUA). This EUA will remain  in effect (meaning this test can be used) for the duration of the COVID-19 declaration under Section 564(b)(1) of the Act, 21 U.S.C.section 360bbb-3(b)(1), unless the authorization is terminated  or revoked sooner.       Influenza A by PCR NEGATIVE NEGATIVE Final   Influenza B by PCR NEGATIVE NEGATIVE Final    Comment: (NOTE) The Xpert Xpress SARS-CoV-2/FLU/RSV plus assay is intended as an aid in the diagnosis of influenza from Nasopharyngeal swab specimens and should not be used as a sole basis for treatment. Nasal washings and aspirates are unacceptable for Xpert Xpress SARS-CoV-2/FLU/RSV testing.  Fact Sheet for Patients: EntrepreneurPulse.com.au  Fact Sheet for Healthcare Providers: IncredibleEmployment.be  This test is not yet approved or cleared by the [place] FDA and has been authorized for detection and/or diagnosis of  SARS-CoV-2 by FDA under an Emergency Use Authorization (EUA). This EUA will remain in effect (meaning this test can be used) for the duration of the COVID-19 declaration under Section 564(b)(1) of the Act, 21 U.S.C. section 360bbb-3(b)(1), unless the authorization is terminated or revoked.  Performed at [facility], [address] [place], [place] [address]      Labs: Basic Metabolic Panel: Recent Labs  Lab 07/29/21 1330  NA 139  K 3.6  CL 103  CO2 27  GLUCOSE 146*  BUN 18  CREATININE 0.99  CALCIUM 9.5   Liver Function Tests: Recent Labs  Lab 07/29/21 1330  AST 20  ALT 14  ALKPHOS 48  BILITOT 0.4  PROT 6.9  ALBUMIN 4.0   CBC: Recent Labs  Lab 07/29/21 1330  WBC 4.9  NEUTROABS 3.1  HGB 13.1  HCT 41.1  MCV 96.7  PLT 243   CBG: Recent Labs  Lab 07/29/21 1310 07/30/21 0401  GLUCAP 174* 108*   Hgb A1c Recent Labs    07/30/21 0104  HGBA1C 5.6   Lipid Profile Recent Labs    07/30/21 0104  CHOL 164  HDL 47  LDLCALC 95  TRIG 112  CHOLHDL 3.5   Thyroid function studies No results for input(s): TSH, T4TOTAL, T3FREE, THYROIDAB in the last 72 hours.  Invalid input(s): FREET3 Urinalysis    Component Value Date/Time   COLORURINE COLORLESS (A) 07/29/2021 1300   APPEARANCEUR CLEAR 07/29/2021 1300   LABSPEC 1.023 07/29/2021 1300   PHURINE 7.0 07/29/2021 1300   GLUCOSEU NEGATIVE 07/29/2021 1300   GLUCOSEU NEGATIVE 04/18/2014 0738   HGBUR MODERATE (A) 07/29/2021 1300   BILIRUBINUR NEGATIVE 07/29/2021 1300   KETONESUR NEGATIVE 07/29/2021 1300   PROTEINUR NEGATIVE 07/29/2021 1300   UROBILINOGEN 0.2 04/18/2014 0738   NITRITE  NEGATIVE 07/29/2021 1300   LEUKOCYTESUR NEGATIVE 07/29/2021 1300    FURTHER DISCHARGE INSTRUCTIONS:   Get Medicines reviewed and adjusted: Please take all your medications with you for your next visit with your Primary MD   Laboratory/radiological data: Please request your Primary MD to go over all  hospital tests and procedure/radiological results at the follow up, please ask your Primary MD to get all Hospital records sent to his/her office.   In some cases, they will be blood work, cultures and biopsy results pending at the time of your discharge. Please request that your primary care M.D. goes through all the records of your hospital data and follows up on these results.   Also Note the following: If you experience worsening of your admission symptoms, develop shortness of breath, life threatening emergency, suicidal or homicidal thoughts you must seek medical attention immediately by calling 911 or calling your MD immediately  if symptoms less severe.   You must read complete instructions/literature along with all the possible adverse reactions/side effects for all the Medicines you take and that have been prescribed to you. Take any new Medicines after you have completely understood and accpet all the possible adverse reactions/side effects.    Do not drive when taking Pain medications or sleeping medications (Benzodaizepines)   Do not take more than prescribed Pain, Sleep and Anxiety Medications. It is not advisable to combine anxiety,sleep and pain medications without talking with your primary care practitioner   Special Instructions: If you have smoked or chewed Tobacco  in the last 2 yrs please stop smoking, stop any regular Alcohol  and or any Recreational drug use.   Wear Seat belts while driving.   Please note: You were cared for by a hospitalist during your hospital stay. Once you are discharged, your primary care physician will handle any further medical issues. Please note that NO REFILLS for any discharge medications will be authorized once you are discharged, as it is imperative that you return to your primary care physician (or establish a relationship with a primary care physician if you do not have one) for your post hospital discharge needs so that they can reassess your  need for medications and monitor your lab values.  Time coordinating discharge: 40 minutes  SIGNED:  Marzetta Board, MD, PhD 07/30/2021, 2:10 PM

## 2021-07-30 NOTE — Progress Notes (Signed)
Received by [facility] into room.  Oriented to room and surroundings.  Notified MD of admission.  Pt resting without complaint.

## 2021-07-30 NOTE — Plan of Care (Signed)
  Problem: Education: Goal: Knowledge of General Education information will improve Description: Including pain rating scale, medication(s)/side effects and non-pharmacologic comfort measures Outcome: Progressing   Problem: Clinical Measurements: Goal: Ability to maintain clinical measurements within normal limits will improve Outcome: Progressing Goal: Will remain free from infection Outcome: Progressing Goal: Diagnostic test results will improve Outcome: Progressing Goal: Respiratory complications will improve Outcome: Progressing Goal: Cardiovascular complication will be avoided Outcome: Progressing   Problem: Ischemic Stroke/TIA Tissue Perfusion: Goal: Complications of ischemic stroke/TIA will be minimized Outcome: Progressing

## 2021-07-30 NOTE — H&P (Signed)
History and Physical    [name] [name] [id number] DOB: 1945-12-29 DOA: 07/29/2021  PCP: Binnie Rail, MD  Patient coming from: Home  I have personally briefly reviewed patient's old medical records in [place]  Chief Complaint: Diplopia  HPI: [name] [name] is a 75 y.o. female with medical history significant of HLD, PAF, MVR, on coumadin.  Pt presents to ED with c/o waking from sleep this AM with diplopia.  Pt has diplopia when both eyes are open, does not have diplopia when one eye is closed.  Symptoms constant, persistent, unchanged since onset.  No fevers, chills, sweats.  Has had some generalized weakness with difficulty ambulating.   ED Course: CT head and CTA head and neck = no acute findings, does have atherosclerotic dz.  Pt transferred from Med center to [facility] for MRI brain to r/o stroke.   Review of Systems: As per HPI, otherwise all review of systems negative.  Past Medical History:  Diagnosis Date   Arthritis    Atypical chest pain    Diverticulosis    Gastritis 11/08/2007, 04/12/2012   H pylori bx neg 03/2012   GERD (gastroesophageal reflux disease) 11/08/2007, 04/12/2012   Hepatic cyst    Hiatal hernia 11/08/2007, 04/12/2012   History of kidney stones    Hyperlipidemia    Hypertension    IBS (irritable bowel syndrome)    diarrhea predom   Migraine    Mitral regurgitation    Osteoporosis    DEXA 01/21/2012: -2.1 spine and R fem, -1.8 L fem s/p fosamax  2004-2011 DEXA 04/18/14 @ [facility]: -2.5 - rec to start Prolia     PVC's (premature ventricular contractions)    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   Complex valvuloplasty including PTFE neochord placement x6 with 30 mm Medtronic Simuform ring annuloplasty with clipping of LA appendage   Schatzki's ring 11/11/2010   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   Shingles outbreak 04/2013    Past Surgical History:  Procedure Laterality Date   [place] STUDY   03/14/2021   Procedure: BUBBLE STUDY;  Surgeon: Sueanne Margarita, MD;  Location: [place];  Service: Cardiovascular;;   CARDIAC CATHETERIZATION     CLIPPING OF ATRIAL APPENDAGE N/A 05/21/2021   Procedure: CLIPPING OF ATRIAL APPENDAGE USING ATRICURE 40MM PRO2 CLIP;  Surgeon: Rexene Alberts, MD;  Location: [place];  Service: Open Heart Surgery;  Laterality: N/A;   COLONOSCOPY     CYSTOCELE REPAIR  2008   ESOPHAGOGASTRODUODENOSCOPY     Maxilofacial  1992   MITRAL VALVE REPAIR  05/21/2021   Procedure: MINIMALLY INVASIVE MITRAL VALVE REPAIR (MVR) USING MEDTRONIC SIMUFORM 30MM RING;  Surgeon: Rexene Alberts, MD;  Location: [place];  Service: Open Heart Surgery;;   RIGHT/LEFT HEART CATH AND CORONARY ANGIOGRAPHY N/A 03/14/2021   Procedure: RIGHT/LEFT HEART CATH AND CORONARY ANGIOGRAPHY;  Surgeon: Lorretta Harp, MD;  Location: [place] CV LAB;  Service: Cardiovascular;  Laterality: N/A;   TEE WITHOUT CARDIOVERSION N/A 03/14/2021   Procedure: TRANSESOPHAGEAL ECHOCARDIOGRAM (TEE);  Surgeon: Sueanne Margarita, MD;  Location: [facility] ENDOSCOPY;  Service: Cardiovascular;  Laterality: N/A;   TEE WITHOUT CARDIOVERSION N/A 05/21/2021   Procedure: TRANSESOPHAGEAL ECHOCARDIOGRAM (TEE);  Surgeon: Rexene Alberts, MD;  Location: [place];  Service: Open Heart Surgery;  Laterality: N/A;   TONSILLECTOMY  1960     reports that she has never smoked. She has never used smokeless tobacco. She reports that she does not drink  alcohol and does not use drugs.  No Known Allergies  Family History  Problem Relation Age of Onset   Hypertension Mother    Alzheimer's disease Mother 44   AAA (abdominal aortic aneurysm) Mother    Stroke Father 72   Kidney cancer Brother        mets   Bone cancer Brother 39   Colon cancer Neg Hx      Prior to Admission medications   Medication Sig Start Date End Date Taking? Authorizing Provider  acetaminophen (TYLENOL) 500 MG tablet Take 500 mg by mouth daily.    [provider]  amiodarone (PACERONE) 200 MG tablet Take 1 tablet (200 mg total) by mouth daily. X 7 days, then decrease to 200 mg ( 1 tab) daily 06/17/21   Barrett, Lodema Hong, PA-C  aspirin EC 81 MG EC tablet Take 1 tablet (81 mg total) by mouth daily. Swallow whole. 05/26/21   Barrett, Erin R, PA-C  atorvastatin (LIPITOR) 20 MG tablet Take 20 mg by mouth daily. 05/20/21   [provider]  calcium carbonate (OS-CAL - DOSED IN MG OF ELEMENTAL CALCIUM) 1250 (500 Ca) MG tablet Take 1 tablet by mouth at bedtime.    [provider]  Cholecalciferol (VITAMIN D3) 1000 units CAPS Take 1,000 Units by mouth at bedtime.    [provider]  cyanocobalamin 1000 MCG tablet Take 1,000 mcg by mouth daily.    [provider]  furosemide (LASIX) 20 MG tablet Take 1 tablet (20 mg total) by mouth daily as needed (for weight gain of 3 lbs or  more). 06/17/21   Barrett, Erin R, PA-C  gabapentin (NEURONTIN) 100 MG capsule Take 1 capsule (100 mg total) by mouth at bedtime. Patient taking differently: Take 100 mg by mouth at bedtime. Patient takes 2 tablets 03/14/21   Sueanne Margarita, MD  metoprolol tartrate (LOPRESSOR) 25 MG tablet Take 0.5 tablets (12.5 mg total) by mouth 2 (two) times daily. 05/26/21   Barrett, Lodema Hong, PA-C  Peppermint Oil (IBGARD) 90 MG CPCR Take as directed 12/24/20   Armbruster, Carlota Raspberry, MD  potassium chloride SA (KLOR-CON) 20 MEQ tablet 1/2 tablet daily on days you take Lasix 06/17/21   Barrett, Erin R, PA-C  warfarin (COUMADIN) 2 MG tablet Take 1-2 tablets Daily or as prescribed by Clinic 07/22/21   Lorretta Harp, MD    Physical Exam: Vitals:   07/29/21 2200 07/29/21 2225 07/29/21 2230 07/29/21 2329  BP: 130/81 133/87 140/89 (!) 147/93  Pulse: 81 80 78 91  Resp: '18 14 19 19  '$ Temp:  97.8 F (36.6 C)  98.1 F (36.7 C)  TempSrc:  Oral  Oral  SpO2: 98% 98% 97% 97%  Weight:      Height:        Constitutional: NAD, calm, comfortable Eyes: PERRL, Dysconjugate  gaze ENMT: Mucous membranes are moist. Posterior pharynx clear of any exudate or lesions.Normal dentition.  Neck: normal, supple, no masses, no thyromegaly Respiratory: clear to auscultation bilaterally, no wheezing, no crackles. Normal respiratory effort. No accessory muscle use.  Cardiovascular: Regular rate and rhythm, no murmurs / rubs / gallops. No extremity edema. 2+ pedal pulses. No carotid bruits.  Abdomen: no tenderness, no masses palpated. No hepatosplenomegaly. Bowel sounds positive.  Musculoskeletal: no clubbing / cyanosis. No joint deformity upper and lower extremities. Good ROM, no contractures. Normal muscle tone.  Skin: no rashes, lesions, ulcers. No induration Neurologic: CN 2-12 grossly intact. Sensation intact, DTR normal. Strength  5/5 in all 4.  Psychiatric: Normal judgment and insight. Alert and oriented x 3. Normal mood.    Labs on Admission: I have personally reviewed following labs and imaging studies  CBC: Recent Labs  Lab 07/29/21 1330  WBC 4.9  NEUTROABS 3.1  HGB 13.1  HCT 41.1  MCV 96.7  PLT 0000000   Basic Metabolic Panel: Recent Labs  Lab 07/29/21 1330  NA 139  K 3.6  CL 103  CO2 27  GLUCOSE 146*  BUN 18  CREATININE 0.99  CALCIUM 9.5   GFR: Estimated Creatinine Clearance: 41.7 mL/min (by C-G formula based on SCr of 0.99 mg/dL). Liver Function Tests: Recent Labs  Lab 07/29/21 1330  AST 20  ALT 14  ALKPHOS 48  BILITOT 0.4  PROT 6.9  ALBUMIN 4.0   No results for input(s): LIPASE, AMYLASE in the last 168 hours. No results for input(s): AMMONIA in the last 168 hours. Coagulation Profile: Recent Labs  Lab 07/29/21 1330  INR 2.5*   Cardiac Enzymes: No results for input(s): CKTOTAL, CKMB, CKMBINDEX, TROPONINI in the last 168 hours. BNP (last 3 results) Recent Labs    02/19/21 0737  PROBNP 222.0*   HbA1C: No results for input(s): HGBA1C in the last 72 hours. CBG: Recent Labs  Lab 07/29/21 1310  GLUCAP 174*   Lipid  Profile: No results for input(s): CHOL, HDL, LDLCALC, TRIG, CHOLHDL, LDLDIRECT in the last 72 hours. Thyroid Function Tests: No results for input(s): TSH, T4TOTAL, FREET4, T3FREE, THYROIDAB in the last 72 hours. Anemia Panel: No results for input(s): VITAMINB12, FOLATE, FERRITIN, TIBC, IRON, RETICCTPCT in the last 72 hours. Urine analysis:    Component Value Date/Time   COLORURINE COLORLESS (A) 07/29/2021 1300   APPEARANCEUR CLEAR 07/29/2021 1300   LABSPEC 1.023 07/29/2021 1300   PHURINE 7.0 07/29/2021 1300   GLUCOSEU NEGATIVE 07/29/2021 1300   GLUCOSEU NEGATIVE 04/18/2014 0738   HGBUR MODERATE (A) 07/29/2021 1300   BILIRUBINUR NEGATIVE 07/29/2021 1300   KETONESUR NEGATIVE 07/29/2021 1300   PROTEINUR NEGATIVE 07/29/2021 1300   UROBILINOGEN 0.2 04/18/2014 0738   NITRITE NEGATIVE 07/29/2021 1300   LEUKOCYTESUR NEGATIVE 07/29/2021 1300    Radiological Exams on Admission: CT HEAD CODE STROKE WO CONTRAST  Addendum Date: 07/29/2021   ADDENDUM REPORT: 07/29/2021 13:33 ADDENDUM: After several unsuccessful attempts, these results were called by telephone at the time of interpretation on 07/29/2021 at 1:28 pm to provider Wynona Dove , who verbally acknowledged these results. Electronically Signed   By: Kellie Simmering D.O.   On: 07/29/2021 13:33   Result Date: 07/29/2021 CLINICAL DATA:  Code stroke. Neuro deficit, acute, stroke suspected. Additional provided: Blurry vision in left eye, double vision, dizziness, diarrhea, symptoms began 1 week ago. EXAM: CT HEAD WITHOUT CONTRAST TECHNIQUE: Contiguous axial images were obtained from the base of the skull through the vertex without intravenous contrast. COMPARISON:  Head CT 11/15/2019. FINDINGS: Brain: Mild generalized cerebral and cerebellar atrophy. Mild patchy and ill-defined hypoattenuation within the cerebral white matter, nonspecific but compatible with chronic small vessel ischemic disease. 1.5 cm calcified meningioma overlying the left frontal lobe.  As before, there is minimal local mass effect upon the underlying left frontal lobe. There is no acute intracranial hemorrhage. No demarcated cortical infarct. No extra-axial fluid collection. No midline shift. Vascular: No hyperdense vessel.  Atherosclerotic calcifications. Skull: Normal. Negative for fracture or focal lesion. Sinuses/Orbits: The left sphenoid sinus is partially opacified with partially calcified material, not simply changed from the prior head CT of 11/15/2019. Mild  bilateral ethmoid sinus mucosal thickening. ASPECTS ([place] Stroke Program Early CT Score) - Ganglionic level infarction (caudate, lentiform nuclei, internal capsule, insula, M1-M3 cortex): 7 - Supraganglionic infarction (M4-M6 cortex): 3 Total score (0-10 with 10 being normal): 10 IMPRESSION: No evidence of acute infarct or acute intracranial hemorrhage. Mild chronic small-vessel ischemic changes within the cerebral white matter. Unchanged 1.5 cm calcified meningioma overlying the left frontal lobe with minimal parenchymal mass effect. Mild generalized parenchymal atrophy. Paranasal sinus disease, as described. Electronically Signed: By: Kellie Simmering D.O. On: 07/29/2021 13:24   CT ANGIO HEAD NECK W WO CM (CODE STROKE)  Result Date: 07/29/2021 CLINICAL DATA:  Aorta, acute, stroke suspected. Blurry vision. Double vision. EXAM: CT ANGIOGRAPHY HEAD AND NECK TECHNIQUE: Multidetector CT imaging of the head and neck was performed using the standard protocol during bolus administration of intravenous contrast. Multiplanar CT image reconstructions and MIPs were obtained to evaluate the vascular anatomy. Carotid stenosis measurements (when applicable) are obtained utilizing NASCET criteria, using the distal internal carotid diameter as the denominator. CONTRAST:  60m OMNIPAQUE IOHEXOL 350 MG/ML SOLN COMPARISON:  Head CT performed earlier today 07/29/2021. FINDINGS: CTA NECK FINDINGS Aortic arch: Common origin of the innominate and left  common carotid arteries. Atherosclerotic plaque within the proximal right subclavian artery. Streak artifact from a dense left-sided contrast bolus partially obscures the left subclavian artery. Within this limitation, there is no appreciable hemodynamically significant stenosis within the innominate or proximal subclavian arteries. Right carotid system: CCA and ICA patent within the neck without stenosis or significant atherosclerotic disease. Left carotid system: CCA and ICA patent within the neck without stenosis or significant atherosclerotic disease. Vertebral arteries: Vertebral arteries codominant and patent within the neck. Minimal nonstenotic atherosclerotic plaque at the origin of the left vertebral artery. Skeleton: Cervical spondylosis. Trace C3-C4 and C4-C5 grade 1 anterolisthesis. Advanced disc space narrowing with degenerative endplate irregularity and degenerative endplate sclerosis at [id number] No acute bony abnormality or aggressive osseous lesion. Other neck: No neck mass or cervical lymphadenopathy. Thyroid unremarkable. Upper chest: No consolidation within the imaged lung apices. Review of the MIP images confirms the above findings CTA HEAD FINDINGS Anterior circulation: The intracranial internal carotid arteries are patent. Calcified plaque within both vessels without stenosis. The M1 middle cerebral arteries are patent. Atherosclerotic irregularity of the M2 and more distal MCA vessels bilaterally without M2 proximal branch occlusion or severe proximal arterial stenosis identified. The anterior cerebral arteries are patent. One-two mm endplate projecting vascular protrusion arising from the paraclinoid right ICA, which may reflect an aneurysm or infundibulum. Posterior circulation: The intracranial vertebral arteries are patent. The basilar artery is patent. The posterior cerebral arteries are patent bilaterally without high-grade proximal stenosis. Hypoplastic left P1 segment with sizable left  posterior communicating artery. The right posterior communicating artery is hypoplastic or absent. Venous sinuses: Within the limitations of contrast timing, no convincing thrombus. Anatomic variants: As described. Review of the MIP images confirms the above findings No intracranial large vessel occlusion. These results were called by telephone at the time of interpretation on 07/29/2021 at 2:03 pm to provider SWynona Dove, who verbally acknowledged these results. IMPRESSION: CTA neck: The common carotid, internal carotid and vertebral arteries are patent within the neck without stenosis. Non-stenotic atherosclerotic plaque at the origin of the left vertebral artery. CTA head: 1. Intracranial atherosclerotic disease, as described. No intracranial large vessel occlusion or proximal high-grade arterial stenosis. 2. 1-2 mm vascular protrusion arising from the paraclinoid right ICA, which may reflect an aneurysm or  infundibulum. Electronically Signed   By: Kellie Simmering D.O.   On: 07/29/2021 14:05    EKG: Independently reviewed.  Assessment/Plan Principal Problem:   Binocular vision disorder with diplopia Active Problems:   Essential hypertension   Paroxysmal atrial fibrillation (HCC)    Diplopia - Concern for stroke MRI pending Stroke pathway If MRI positive: get neuro consult and order remainder of stroke work up. Cont ASA 81 for now PT/OT HTN - Holding home BP meds until MRI back PAF - Per neurology: hold coumadin until MRI back, resume if it shows no stroke, or only very small stroke. INR 2.5 Continue amiodarone Tele monitor  DVT prophylaxis: SCDs Code Status: Full Family Communication: No family in room Disposition Plan: Home after cleared by neurology Consults called: Dr. Rory Percy Admission status: Place in obs     Marrion Accomando M. DO [facility]  How to contact the [facility] Attending or Consulting provider [place] or covering provider during after hours [place], for this  patient?  Check the care team in [facility] and look for a) attending/consulting [facility] provider listed and b) the [facility] team listed Log into [email or web address]  Amion Physician Scheduling and messaging for groups and whole hospitals  On call and physician scheduling software for group practices, residents, hospitalists and other medical providers for call, clinic, rotation and shift schedules. OnCall [employer] is a hospital-wide system for scheduling doctors and paging doctors on call. EasyPlot is for scientific plotting and data analysis.  [email or web address]  and use [place]'s universal password to access. If you do not have the password, please contact the hospital operator.  Locate the [facility] provider you are looking for under [facility] and page to a number that you can be directly reached. If you still have difficulty reaching the provider, please page the [facility] (Director on Call) for the Hospitalists listed on amion for assistance.  07/30/2021, 12:26 AM

## 2021-07-30 NOTE — Progress Notes (Signed)
ANTICOAGULATION CONSULT NOTE - Initial Consult  Pharmacy Consult for warfarin Indication: atrial fibrillation, hx MV repair  No Known Allergies  Patient Measurements: Height: '5\' 4"'$  (162.6 cm) Weight: 59.4 kg (131 lb) IBW/kg (Calculated) : 54.7  Vital Signs: Temp: 98 F (36.7 C) (09/06 0815) Temp Source: Oral (09/06 0815) BP: 147/88 (09/06 0815) Pulse Rate: 98 (09/06 0815)  Labs: Recent Labs    07/29/21 1330 07/30/21 0104  HGB 13.1  --   HCT 41.1  --   PLT 243  --   APTT 36  --   LABPROT 26.8* 25.7*  INR 2.5* 2.4*  CREATININE 0.99  --     Estimated Creatinine Clearance: 41.7 mL/min (by C-G formula based on SCr of 0.99 mg/dL).   Medical History: Past Medical History:  Diagnosis Date   Arthritis    Atypical chest pain    Diverticulosis    Gastritis 11/08/2007, 04/12/2012   H pylori bx neg 03/2012   GERD (gastroesophageal reflux disease) 11/08/2007, 04/12/2012   Hepatic cyst    Hiatal hernia 11/08/2007, 04/12/2012   History of kidney stones    Hyperlipidemia    Hypertension    IBS (irritable bowel syndrome)    diarrhea predom   Migraine    Mitral regurgitation    Osteoporosis    DEXA 01/21/2012: -2.1 spine and R fem, -1.8 L fem s/p fosamax  2004-2011 DEXA 04/18/14 @ [facility]: -2.5 - rec to start Prolia     PVC's (premature ventricular contractions)    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  She presented to ED 9/5 after she awoke with diplopia.  CT and MRI negative for acute stroke.  OK per  neuro to restart oral anticoagulation.    INR therapeutic today.  Will order higher dose of home regimen since warfarin was held 9/5 and expect INR will trend down.  PTA warfarin regimen = '2mg'$  daily except '3mg'$  on Mon/Fri  Goal of Therapy:  INR 2-3 Monitor platelets by anticoagulation protocol: Yes   Plan:  Warfarin '3mg'$  PO x 1 tonight. Daily INR.  [employer], Pharm.D., BCPS Clinical Pharmacist Clinical phone for 07/30/2021 from 8:30-4:00 is [phone].  **Pharmacist phone directory can be found on [email or web address] listed under [place].  07/30/2021 10:31 AM

## 2021-07-30 NOTE — Progress Notes (Signed)
OT Treatment Note Focus of session on review of information regarding partial occlusion with pt and educating husband on use of partial occlusion to improve functional vision and reduce risk of falls. Handout provided and  reviewed. Pt to follow up with her eye doctor to monitor double vision. No further OT needed at this time.     07/30/21 1500  OT Visit Information  Last OT Received On 07/30/21  Assistance Needed +1  History of Present Illness 75 y.o. female admitted with diplopia. MRI (-)  for CVA, but does show 1.6 cm calcified meningioma overlying the left frontal convexity without significant edema or mass effect. Past medical history significant of HLD, PAF, MVR, on coumadin, osteoporosis, HTN.  Precautions  Precautions Fall  Pain Assessment  Pain Assessment No/denies pain  Cognition  Arousal/Alertness Awake/alert  Behavior During Therapy WFL for tasks assessed/performed  Overall Cognitive Status Within Functional Limits for tasks assessed  General Comments required cues for memory and problem solving from earlier session. Educated husband on need to discuss any cognitive concerns with her MD if she is not at her baseline.  ADL  General ADL Comments Educated pt/husband on strategies to reduce risk of falls. Encouarged S with mobility and ADL initially. Pt/husband verbalized understanding.  Vision- Assessment  Additional Comments Educated pt/husband on management of diplopia using partial occlusion. handout reviewed and provided. Pt/husband able to return demonstrate.  Transfers  Overall transfer level Modified independent  General Comments  General comments (skin integrity, edema, etc.) recommend follow up with eye doctor  OT - End of Session  Activity Tolerance Patient tolerated treatment well  Patient left Other (comment) (sitting in chair waiting to DC)  OT Assessment/Plan  OT Plan All goals met and education completed, patient discharged from OT services  OT Visit Diagnosis  Unsteadiness on feet (R26.81);Low vision, both eyes (H54.2)  Follow Up Recommendations  (follow up with eye doctor; refrain from driving)  OT Equipment 3 in 1 bedside commode  AM-PAC OT "6 Clicks" Daily Activity Outcome Measure (Version 2)  Help from another person eating meals? 4  Help from another person taking care of personal grooming? 3  Help from another person toileting, which includes using toliet, bedpan, or urinal? 3  Help from another person bathing (including washing, rinsing, drying)? 3  Help from another person to put on and taking off regular upper body clothing? 3  Help from another person to put on and taking off regular lower body clothing? 3  6 Click Score 19  Progressive Mobility  What is the highest level of mobility based on the progressive mobility assessment? Level 6 (Walks independently in room and hall) - Balance while walking in room without assist - Complete  Mobility Ambulated independently in room  OT Goal Progression  Progress towards OT goals Goals met/education completed, patient discharged from OT  Acute Rehab OT Goals  Patient Stated Goal home  OT Goal Formulation With patient  Time For Goal Achievement 08/13/21  Potential to Achieve Goals Good  ADL Goals  Pt Will Transfer to Toilet with modified independence;ambulating  Pt Will Perform Toileting - Clothing Manipulation and hygiene with modified independence;sit to/from stand  Additional ADL Goal #1 Pt will independently verbalize technique to manage partial occlusion to improve funcitonal vision and reduce risk of falls  Additional ADL Goal #2 Pt will independently verbalize 3 strategies to reduce risk of falls  OT Time Calculation  OT Start Time (ACUTE ONLY) 1400  OT Stop Time (ACUTE ONLY) 1418    OT Time Calculation (min) 18 min  OT General Charges  $OT Visit 1 Visit  OT Treatments  $Therapeutic Activity 8-22 mins  Maurie Boettcher, OT/L   Acute OT Clinical Specialist [place] Pager [phone] Office [phone]

## 2021-07-30 NOTE — Plan of Care (Signed)

## 2021-07-30 NOTE — Evaluation (Signed)
Physical Therapy Evaluation Patient Details Name: [name] [name] MRN: [id number] DOB: 19-Aug-1946 Today's Date: 07/30/2021   History of Present Illness  75 y.o. female admitted with diplopia. MRI (-)  for CVA, but does show 1.6 cm calcified meningioma overlying the left frontal convexity without significant edema or mass effect. Past medical history significant of HLD, PAF, MVR, on coumadin, osteoporosis, HTN.  Clinical Impression  Pt presents with diplopia (aided by occlusion glasses provided by OT), mild unsteadiness with certain dynamic balance tasks but DGI 21/24 not placing pt in risk of falls category, slightly increased time and effort to mobilize vs baseline, and decreased activity tolerance vs baseline. Pt to benefit from acute PT to address deficits. Pt ambulated hallway distance without AD, only demonstrating mild balance deficits with head turns and demonstrates mild R inattention. PT educated pt on importance of scanning environment for safety and having supervision of husband at d/c. PT to progress mobility as tolerated, and will continue to follow acutely.      Follow Up Recommendations No PT follow up;Supervision for mobility/OOB    Equipment Recommendations  None recommended by PT    Recommendations for Other Services       Precautions / Restrictions Precautions Precautions: Fall Restrictions Weight Bearing Restrictions: No      Mobility  Bed Mobility Overal bed mobility: Modified Independent Bed Mobility: Supine to Sit     Supine to sit: Modified independent (Device/Increase time)          Transfers Overall transfer level: Needs assistance Equipment used: None Transfers: Sit to/from Stand Sit to Stand: Supervision         General transfer comment: for safety, no physical assist.  Ambulation/Gait Ambulation/Gait assistance: Supervision Gait Distance (Feet): 400 Feet Assistive device: None Gait Pattern/deviations: Step-through pattern;Drifts  right/left Gait velocity: wfl   General Gait Details: supervision for safety, no LOB or overt unsteadiness noted. Pt drifting R x3 with nearly bumping into objects on R, requires cues to aboid and PT cuing for pt visually sacanning environment.  Stairs Stairs: Yes Stairs assistance: Supervision Stair Management: Two rails;Alternating pattern;Forwards Number of Stairs: 2 General stair comments: supervision for safety  Wheelchair Mobility    Modified Rankin (Stroke Patients Only)       Balance Overall balance assessment: Needs assistance Sitting-balance support: No upper extremity supported Sitting balance-Leahy Scale: Normal     Standing balance support: No upper extremity supported Standing balance-Leahy Scale: Good                   Standardized Balance Assessment Standardized Balance Assessment : Dynamic Gait Index   Dynamic Gait Index Level Surface: Normal Change in Gait Speed: Normal Gait with Horizontal Head Turns: Mild Impairment Gait with Vertical Head Turns: Mild Impairment Gait and Pivot Turn: Normal Step Over Obstacle: Normal Step Around Obstacles: Normal Steps: Mild Impairment Total Score: 21       Pertinent Vitals/Pain Pain Assessment: Faces Faces Pain Scale: No hurt Pain Intervention(s): Monitored during session    Home Living Family/patient expects to be discharged to:: Private residence Living Arrangements: Spouse/significant other Available Help at Discharge: Available 24 hours/day Type of Home: Apartment Home Access: Level entry     Home Layout: One level Home Equipment: Walker - 2 wheels      Prior Function Level of Independence: Independent         Comments: was driving, state she enjoys doing errands, going to church.     Hand Dominance   Dominant Hand: Right  Extremity/Trunk Assessment   Upper Extremity Assessment Upper Extremity Assessment: Defer to OT evaluation    Lower Extremity Assessment Lower  Extremity Assessment: Overall WFL for tasks assessed (symmetric L vs R, 5/5 knee flex/ext, hip abd/add)    Cervical / Trunk Assessment Cervical / Trunk Assessment: Normal  Communication   Communication: No difficulties  Cognition Arousal/Alertness: Awake/alert Behavior During Therapy: WFL for tasks assessed/performed Overall Cognitive Status: Within Functional Limits for tasks assessed                                        General Comments General comments (skin integrity, edema, etc.): wearing occlusion glasses for diplopia provided by OT    Exercises     Assessment/Plan    PT Assessment Patient needs continued PT services  PT Problem List Decreased activity tolerance;Decreased balance;Decreased mobility;Decreased safety awareness       PT Treatment Interventions DME instruction;Therapeutic activities;Gait training;Therapeutic exercise;Patient/family education;Balance training;Stair training;Functional mobility training;Neuromuscular re-education    PT Goals (Current goals can be found in the Care Plan section)  Acute Rehab PT Goals Patient Stated Goal: home PT Goal Formulation: With patient Time For Goal Achievement: 08/13/21 Potential to Achieve Goals: Good    Frequency Min 4X/week   Barriers to discharge        Co-evaluation               AM-PAC PT "6 Clicks" Mobility  Outcome Measure Help needed turning from your back to your side while in a flat bed without using bedrails?: None Help needed moving from lying on your back to sitting on the side of a flat bed without using bedrails?: None Help needed moving to and from a bed to a chair (including a wheelchair)?: None Help needed standing up from a chair using your arms (e.g., wheelchair or bedside chair)?: None Help needed to walk in hospital room?: A Little Help needed climbing 3-5 steps with a railing? : A Little 6 Click Score: 22    End of Session Equipment Utilized During Treatment:  Gait belt Activity Tolerance: Patient tolerated treatment well Patient left: in chair;with call bell/phone within reach;with chair alarm set Nurse Communication: Mobility status PT Visit Diagnosis: Other abnormalities of gait and mobility (R26.89)    Time: [id number] PT Time Calculation (min) (ACUTE ONLY): 20 min   Charges:   PT Evaluation $PT Eval Low Complexity: 1 Low        Cola Gane S, PT DPT Acute Rehabilitation Services Pager [phone]  Office [phone]   [place] E Ruffin Pyo 07/30/2021, 10:13 AM

## 2021-07-31 ENCOUNTER — Encounter (HOSPITAL_COMMUNITY)
Admission: RE | Admit: 2021-07-31 | Discharge: 2021-07-31 | Disposition: A | Discharge status: Home / Self Care | Payer: PPO | Source: Ambulatory Visit | Attending: Cardiovascular Disease | Admitting: Cardiovascular Disease

## 2021-07-31 ENCOUNTER — Telehealth: Payer: Self-pay

## 2021-07-31 ENCOUNTER — Other Ambulatory Visit: Payer: Self-pay

## 2021-07-31 ENCOUNTER — Ambulatory Visit (INDEPENDENT_AMBULATORY_CARE_PROVIDER_SITE_OTHER): Payer: PPO | Admitting: *Deleted

## 2021-07-31 DIAGNOSIS — H4911 Fourth [trochlear] nerve palsy, right eye: Secondary | ICD-10-CM | POA: Diagnosis not present

## 2021-07-31 DIAGNOSIS — H26493 Other secondary cataract, bilateral: Secondary | ICD-10-CM | POA: Diagnosis not present

## 2021-07-31 DIAGNOSIS — H40013 Open angle with borderline findings, low risk, bilateral: Secondary | ICD-10-CM | POA: Diagnosis not present

## 2021-07-31 DIAGNOSIS — M810 Age-related osteoporosis without current pathological fracture: Secondary | ICD-10-CM | POA: Diagnosis not present

## 2021-07-31 DIAGNOSIS — H532 Diplopia: Secondary | ICD-10-CM | POA: Diagnosis not present

## 2021-07-31 DIAGNOSIS — Z9889 Other specified postprocedural states: Secondary | ICD-10-CM

## 2021-07-31 MED ORDER — DENOSUMAB 60 MG/ML ~~LOC~~ SOSY
60.0000 mg | PREFILLED_SYRINGE | Freq: Once | SUBCUTANEOUS | Status: AC
  Administered 2021-07-31: 60 mg via SUBCUTANEOUS

## 2021-07-31 NOTE — Telephone Encounter (Signed)
Transition Care Management Follow-up Telephone Call     Date discharged? 07/30/21   How have you been since you were released from the hospital? Doing ok, is tried, trouble with glassed going to eye dr 07/31/21, going to cardiac rehab   Any patient concerns? no concerns      Items Reviewed: Medications reviewed: yes no changes held coumadin one day per pt Allergies reviewed: no allergies  Dietary changes reviewed: no changes  Referrals reviewed: cardiac rehab, eye dr 07/31/21      Functional Questionnaire:  Independent - I Dependent - D    Activities of Daily Living (ADLs):     Personal hygiene - I Dressing -I Eating - I Maintaining continence - I Transferring - D husband helps when she is dizzy   Independent Activities of Daily Living (iADLs): Basic communication skills - I Transportation - D Meal preparation  - I Shopping - I Housework - I Managing medications - I Managing personal finances - I   Confirmed importance and date/time of follow-up visits scheduled 08/02/2021 AT 2:30 Provider Appointment booked with Dr Tomi Likens   Confirmed with patient if condition begins to worsen call PCP or go to the ER.  Patient was given the office number and encouraged to call back with question or concerns: Yes

## 2021-07-31 NOTE — Progress Notes (Signed)
Incomplete Session Note  Patient Details  Name: [name] [name] MRN: [id number] Date of Birth: December 05, 1945 Referring Provider:   Flowsheet Row CARDIAC REHAB PHASE II ORIENTATION from 07/11/2021 in [facility]  Referring Provider Quay Burow, MD       Isaac Laud did not complete her rehab session.  Aeron and her husband arrived to cardiac rehab for exercise.  Ganelle appears to need assistance with walking as she is unsteady and states that she feels unsteady on her feet.  Kingsleigh continues to report that she went to ER with the complaint of double vision, dizziness and diarrhea for the last past week.  Pt advised not to exercise today due to safety concerns and she had not completed any additional recommendations for follow up.  Lizmary to see neuro opthalmology and were given a name and number.  [name] is seeing her eye doctor this afternoon and plans to ask for referral to this specialist. Advised pt to keep [place] updated.  Verbalized understanding Antwione Picotte [employer] operational officer, BSN Cardiac and Pulmonary Rehab Nurse Navigator

## 2021-07-31 NOTE — Progress Notes (Signed)
Pls cosign for prolia inj../lmb 

## 2021-08-01 ENCOUNTER — Telehealth: Payer: Self-pay | Admitting: Internal Medicine

## 2021-08-01 NOTE — Progress Notes (Addendum)
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 Hypertension Hyperlipidemia Cervicogenic headache with cervical spondylosis  Secondary stroke prevention as managed by PCP and cardiology: Coumadin (INR 2-3) and ASA '81mg'$  daily Atorvastatin '40mg'$  daily.  LDL goal less than 70 Normotensive blood pressure Hgb A1c goal less than 7 Follow up with neuro-ophthalmology - referral sent. For headaches, increase gabapentin to '100mg'$  in morning and '200mg'$  at bedtime Follow up 6 months.   Subjective:  [name] [name] is a 75 year old female who presents for transition of care regarding new stroke.  MRI brain and CTA head and neck personally reviewed.  She was admitted to [facility] on 07/29/2021 for stroke-like symptoms.  She presented for binocular vertical diplopia.  No speech disturbance, facial droop, vertigo, ataxia or unilateral numbness or weakness.  IV t-PA not given as she was outside the window.  CT head showed stable left frontal meningioma but no acute stroke.  CTA of head and neck negative for LVO or hemodynamically significant stenosis.  MRI of brain was negative for acute stroke but DWI-negative brainstem infarct was suspected.  LDL was 95 and Hgb A1c was 5.6.  Echocardiogram performed in August showed EF 40-45%.  Was already on Coumadin for MVR with appendage clipping in June.  She was continued on Coumadin and ASA '81mg'$  daily.  Lipitor was increased from '20mg'$  to '40mg'$  daily.  Still with vertical diplopia.  Cervical X-ray showed degenerative changes, also noted on CTA of neck.  Gabapentin '200mg'$  at bedtime not effective.  PAST MEDICAL HISTORY: Past Medical History:  Diagnosis Date   Arthritis    Atypical chest pain    Diverticulosis     Gastritis 11/08/2007, 04/12/2012   H pylori bx neg 03/2012   GERD (gastroesophageal reflux disease) 11/08/2007, 04/12/2012   Hepatic cyst    Hiatal hernia 11/08/2007, 04/12/2012   History of kidney stones    Hyperlipidemia    Hypertension    IBS (irritable bowel syndrome)    diarrhea predom   Migraine    Mitral regurgitation    Osteoporosis    DEXA 01/21/2012: -2.1 spine and R fem, -1.8 L fem s/p fosamax  2004-2011 DEXA 04/18/14 @ [facility]: -2.5 - rec to start Prolia     PVC's (premature ventricular contractions)    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     amiodarone (PACERONE) 200 MG tablet Take 1 tablet (200 mg total) by mouth daily. X 7 days, then decrease to 200 mg ( 1 tab) daily (Patient not taking: No sig reported) 60 tablet 1   aspirin EC 81 MG EC tablet Take 1 tablet (81 mg total) by mouth daily. Swallow whole. 30 tablet 11   atorvastatin (LIPITOR) 20 MG tablet Take 20 mg by mouth daily.     furosemide (LASIX) 20 MG tablet Take 1 tablet (20 mg total) by mouth daily  as needed (for weight gain of 3 lbs or  more). 30 tablet    gabapentin (NEURONTIN) 100 MG capsule Take 1 capsule (100 mg total) by mouth at bedtime. (Patient taking differently: Take 200 mg by mouth at bedtime.)     metoprolol tartrate (LOPRESSOR) 25 MG tablet Take 0.5 tablets (12.5 mg total) by mouth 2 (two) times daily. 60 tablet 3   potassium chloride SA (KLOR-CON) 20 MEQ tablet 1/2 tablet daily on days you take Lasix     warfarin (COUMADIN) 2 MG tablet Take 1-2 tablets  Daily or as prescribed by Clinic (Patient taking differently: Take 3 mg by mouth daily. At 1600) 60 tablet 1   No current facility-administered medications on file prior to visit.    ALLERGIES: No Known Allergies  FAMILY HISTORY: Family History  Problem Relation Age of Onset   Hypertension Mother    Alzheimer's disease Mother 67   AAA (abdominal aortic aneurysm) Mother    Stroke Father 55   Kidney cancer Brother        mets   Bone cancer Brother 69   Colon cancer Neg Hx       Objective:  Blood pressure 131/79, pulse 90, height '5\' 3"'$  (1.6 m), weight 133 lb 9.6 oz (60.6 kg), SpO2 97 %. General: No acute distress.  Patient appears well-groomed.   Head:  Normocephalic/atraumatic Eyes:  Fundi examined but not visualized Neck: supple, no paraspinal tenderness, full range of motion Heart:  Regular rate and rhythm Lungs:  Clear to auscultation bilaterally Back: No paraspinal tenderness Neurological Exam: alert and oriented to person, place, and time.  Speech fluent and not dysarthric, language intact.  Eye movements grossly intact.  Reports worsening diplopia head tilt to right and downward gaze and improved with head tilt to left.  Otherwise, CN II-XII intact. Bulk and tone normal, muscle strength 5/5 throughout.  Sensation to light touch intact.  Deep tendon reflexes 2+ throughout, toes downgoing.  Finger to nose testing intact.  Gait normal, Romberg negative.   Metta Clines, DO  CC: Billey Gosling, MD

## 2021-08-01 NOTE — Telephone Encounter (Signed)
LVM for pt to rtn my call to schedule AWV with [facility]. Please schedule AWV if pt calls the office.   Thanks

## 2021-08-02 ENCOUNTER — Telehealth (HOSPITAL_COMMUNITY): Payer: Self-pay | Admitting: *Deleted

## 2021-08-02 ENCOUNTER — Encounter: Payer: Self-pay | Admitting: Neurology

## 2021-08-02 ENCOUNTER — Other Ambulatory Visit: Payer: Self-pay

## 2021-08-02 ENCOUNTER — Encounter (HOSPITAL_COMMUNITY): Payer: PPO

## 2021-08-02 ENCOUNTER — Ambulatory Visit: Payer: PPO | Admitting: Neurology

## 2021-08-02 VITALS — BP 131/79 | HR 90 | Ht 63.0 in | Wt 133.6 lb

## 2021-08-02 DIAGNOSIS — M47812 Spondylosis without myelopathy or radiculopathy, cervical region: Secondary | ICD-10-CM | POA: Diagnosis not present

## 2021-08-02 DIAGNOSIS — I480 Paroxysmal atrial fibrillation: Secondary | ICD-10-CM | POA: Diagnosis not present

## 2021-08-02 DIAGNOSIS — I340 Nonrheumatic mitral (valve) insufficiency: Secondary | ICD-10-CM | POA: Diagnosis not present

## 2021-08-02 DIAGNOSIS — E782 Mixed hyperlipidemia: Secondary | ICD-10-CM | POA: Diagnosis not present

## 2021-08-02 DIAGNOSIS — H532 Diplopia: Secondary | ICD-10-CM

## 2021-08-02 DIAGNOSIS — G4486 Cervicogenic headache: Secondary | ICD-10-CM | POA: Diagnosis not present

## 2021-08-02 DIAGNOSIS — I639 Cerebral infarction, unspecified: Secondary | ICD-10-CM

## 2021-08-02 MED ORDER — GABAPENTIN 100 MG PO CAPS: ORAL_CAPSULE | ORAL | 5 refills | Status: DC

## 2021-08-02 NOTE — Telephone Encounter (Signed)
Spoke with Mr and [name] Snoddy. [name] [name] is still having double vision. Will hold off on exercise until symptoms have resolved and clearance from Dr Quay Burow to return to exercise. Patient and husband are in agreement to the plan. Barnet Pall, RN,BSN 08/02/2021 8:45 AM

## 2021-08-02 NOTE — Patient Instructions (Addendum)
Increase gabapentin to 1 pill in morning and 2 pills at bedtime.  No improvement in headaches in 4 weeks, contact me. Refer to Dr. Michel Bickers at [facility] regarding double vision Continue warfarin, aspirin, atorvastatin, etc Follow up 6 months.

## 2021-08-02 NOTE — Telephone Encounter (Signed)
Do to some unforseen circumstances pt will have to drop cardiac rehab, I canceled pt remaining sessions and closed referral

## 2021-08-05 ENCOUNTER — Encounter (HOSPITAL_COMMUNITY): Payer: PPO

## 2021-08-05 NOTE — Progress Notes (Signed)
Subjective:    Patient ID: [name] [name], female    DOB: 1946/10/27, 75 y.o.   MRN: [id number]  This visit occurred during the SARS-CoV-2 public health emergency.  Safety protocols were in place, including screening questions prior to the visit, additional usage of staff PPE, and extensive cleaning of exam room while observing appropriate contact time as indicated for disinfecting solutions.     HPI The patient is here for follow up from the ED.    Admitted 9/5 - 9/6 for binocular diplopia  She woke up from sleep with diplopia.  She had diplopia with both eyes open, no diplopia with one eye closed.  Symptoms are constant, persistent and unchanged.  She had some generalized weakness w/ difficulty ambulating.  Code stroke called.  CT and MRI negative.   Neurology consulted and followed.   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  Lipitor was increased to 40 mg daily.  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  She was referred to a neuro-ophthalmologist in [place].  Her gabapnetin was increased to 200 mg   Next week-Tuesday - neuro-ophthalmology Wednesday - dr Jerline Pain  She is not having some diplopia, but it is slightly better.  She now has to look downward in order to have the double vision, but will not experience it with looking straight.  Also having diarrhea x3 weeks.  Improved while in the hospital.  She has multiple times a day and at night.  Currently taking Imodium, eating bland diet and drinking Pedialyte.  Yesterday and today she only had 1 episode of diarrhea.?  Getting better.  She denies any blood in the stool.  No reflux.  Has been taking IBgard regularly and that has really helped her symptoms.  In the past 2 episodes of diarrhea that improved with Augmentin twice  daily x10 days.  Has been diagnosed with Diverticulitis, probable infectious colitis.      Medications and allergies reviewed with patient and updated if appropriate.  Patient Active Problem List   Diagnosis Date Noted   Binocular vision disorder with diplopia 07/29/2021   Paroxysmal atrial fibrillation ([place]) 06/19/2021   Long term (current) use of anticoagulants 05/29/2021   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   Mitral regurgitation 02/28/2021   Heart failure ([place]) 02/21/2021   Murmur 02/21/2021   Abnormal CT scan of lung 02/06/2021   Chest pain 02/05/2021   Cardiomegaly 02/05/2021   Left lower quadrant abdominal pain 05/01/2020   Acute post-traumatic headache, not intractable 11/15/2019   Prediabetes 05/02/2019   Carbuncle of groin 02/25/2019   Skin abnormalities 05/07/2018   Bilateral shoulder pain 12/11/2017   Gastritis 12/11/2017   Trigger finger, right little finger 09/11/2017   Numbness and tingling in left hand 07/20/2017   Dupuytren's contracture of hand 07/20/2017   CTS (carpal tunnel syndrome) 07/17/2017   Bilateral carpal tunnel syndrome 07/17/2017   Arthralgia 06/30/2017   Leg cramps 06/10/2017   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   Chronic midline low back pain without sciatica 09/15/2016   Osteopenia 07/20/2016   Bilateral finger numbness 06/03/2016   Joint pain 06/03/2016   Hyperlipidemia 02/09/2015   Bilateral primary osteoarthritis of knee 10/03/2013   Lateral meniscal tear 10/03/2013   Patellofemoral pain syndrome 10/03/2013   Varicose  vein of leg    Allergic rhinitis    IBS (irritable bowel syndrome) 04/07/2011   Epigastric pain 12/26/2010   Diarrhea 12/25/2010   Migraine headache 09/27/2010   Essential hypertension 09/27/2010   GERD 09/27/2010   RENAL CALCULUS, HX OF 09/27/2010    Current Outpatient Medications on File Prior to Visit  Medication Sig Dispense Refill   acetaminophen (TYLENOL) 500 MG tablet Take  500 mg by mouth every 4 (four) hours as needed for mild pain.     aspirin EC 81 MG EC tablet Take 1 tablet (81 mg total) by mouth daily. Swallow whole. 30 tablet 11   atorvastatin (LIPITOR) 40 MG tablet Take 40 mg by mouth daily.     gabapentin (NEURONTIN) 100 MG capsule Take 1 capsule in morning and 2 capsules at bedtime (Patient taking differently: Patient is taking 200 mg at night) 90 capsule 5   metoprolol tartrate (LOPRESSOR) 25 MG tablet Take 0.5 tablets (12.5 mg total) by mouth 2 (two) times daily. 60 tablet 3   warfarin (COUMADIN) 2 MG tablet Take 1-2 tablets Daily or as prescribed by Clinic (Patient taking differently: Take 3 mg by mouth daily. At 1600) 60 tablet 1   No current facility-administered medications on file prior to visit.    Past Medical History:  Diagnosis Date   Arthritis    Atypical chest pain    Diverticulosis    Gastritis 11/08/2007, 04/12/2012   H pylori bx neg 03/2012   GERD (gastroesophageal reflux disease) 11/08/2007, 04/12/2012   Hepatic cyst    Hiatal hernia 11/08/2007, 04/12/2012   History of kidney stones    Hyperlipidemia    Hypertension    IBS (irritable bowel syndrome)    diarrhea predom   Migraine    Mitral regurgitation    Osteoporosis    DEXA 01/21/2012: -2.1 spine and R fem, -1.8 L fem s/p fosamax  2004-2011 DEXA 04/18/14 @ [facility]: -2.5 - rec to start Prolia     PVC's (premature ventricular contractions)    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   Complex valvuloplasty including PTFE neochord placement x6 with 30 mm Medtronic Simuform ring annuloplasty with clipping of LA appendage   Schatzki's ring 11/11/2010   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   Shingles outbreak 04/2013    Past Surgical History:  Procedure Laterality Date   [place] STUDY  03/14/2021   Procedure: BUBBLE STUDY;  Surgeon: Sueanne Margarita, MD;  Location: [place] [facility];  Service: Cardiovascular;;   CARDIAC CATHETERIZATION     CLIPPING OF  ATRIAL APPENDAGE N/A 05/21/2021   Procedure: CLIPPING OF ATRIAL APPENDAGE USING ATRICURE 40MM PRO2 CLIP;  Surgeon: Rexene Alberts, MD;  Location: [facility];  Service: Open Heart Surgery;  Laterality: N/A;   COLONOSCOPY     CYSTOCELE REPAIR  2008   ESOPHAGOGASTRODUODENOSCOPY     Maxilofacial  1992   MITRAL VALVE REPAIR  05/21/2021   Procedure: MINIMALLY INVASIVE MITRAL VALVE REPAIR (MVR) USING MEDTRONIC SIMUFORM 30MM RING;  Surgeon: Rexene Alberts, MD;  Location: [facility];  Service: Open Heart Surgery;;   RIGHT/LEFT HEART CATH AND CORONARY ANGIOGRAPHY N/A 03/14/2021   Procedure: RIGHT/LEFT HEART CATH AND CORONARY ANGIOGRAPHY;  Surgeon: Lorretta Harp, MD;  Location: [facility] [place];  Service: Cardiovascular;  Laterality: N/A;   TEE WITHOUT CARDIOVERSION N/A 03/14/2021   Procedure: TRANSESOPHAGEAL ECHOCARDIOGRAM (TEE);  Surgeon: Sueanne Margarita, MD;  [facility] ENDOSCOPY;  Service: Cardiovascular;  Laterality: N/A;   TEE  WITHOUT CARDIOVERSION N/A 05/21/2021   Procedure: TRANSESOPHAGEAL ECHOCARDIOGRAM (TEE);  Surgeon: Rexene Alberts, MD;  Location: [facility];  Service: Open Heart Surgery;  Laterality: N/A;   TONSILLECTOMY  1960    Social History   Socioeconomic History   Marital status: Married    Spouse name: [name]   Number of children: 3   Years of education: Not on file   Highest education level: Master's degree (e.g., MA, MS, MEng, MEd, MSW, MBA)  Occupational History   Occupation: retired  Tobacco Use   Smoking status: Never   Smokeless tobacco: Never  Vaping Use   Vaping Use: Never used  Substance and Sexual Activity   Alcohol use: No   Drug use: No   Sexual activity: Not on file  Other Topics Concern   Not on file  Social History Narrative   Married, lives with spouse. Retired Licensed conveyancer, Print production planner. Has 3 kids (moved to [place] to be closer)- youngest son MD. Dorie Rank to  from [place] 06/2010, lived in [place] x 10years   Social Determinants of Health   Financial  Resource Strain: Low Risk    Difficulty of Paying Living Expenses: Not hard at all  Food Insecurity: Not on file  Transportation Needs: Not on file  Physical Activity: Not on file  Stress: Not on file  Social Connections: Not on file    Family History  Problem Relation Age of Onset   Hypertension Mother    Alzheimer's disease Mother 27   AAA (abdominal aortic aneurysm) Mother    Stroke Father 28   Kidney cancer Brother        mets   Bone cancer Brother 19   Colon cancer Neg Hx     Review of Systems  Constitutional:  Negative for fever.  Eyes:  Positive for visual disturbance.  Respiratory:  Negative for cough, shortness of breath and wheezing.   Cardiovascular:  Negative for chest pain, palpitations and leg swelling.  Gastrointestinal:  Positive for abdominal pain and diarrhea. Negative for blood in stool and nausea.  Neurological:  Positive for dizziness and headaches.       When walking - body pulls to the left      Objective:   Vitals:   08/06/21 1031  BP: 140/74  Pulse: (!) 52  Temp: 98.5 F (36.9 C)  SpO2: 98%   BP Readings from Last 3 Encounters:  08/06/21 140/74  08/02/21 131/79  07/30/21 127/88   Wt Readings from Last 3 Encounters:  08/06/21 131 lb (59.4 kg)  08/02/21 133 lb 9.6 oz (60.6 kg)  07/29/21 131 lb (59.4 kg)   Body mass index is 23.21 kg/m.   Physical Exam    Constitutional: Appears well-developed and well-nourished. No distress.  HENT:  Head: Normocephalic and atraumatic.  Neck: Neck supple. No tracheal deviation present. No thyromegaly present.  No cervical lymphadenopathy Cardiovascular: Normal rate, regular rhythm and normal heart sounds.   No murmur heard. No carotid bruit .  No edema Pulmonary/Chest: Effort normal and breath sounds normal. No respiratory distress. No has no wheezes. No rales. Abdomen: Soft, nondistended, diffuse tenderness without rebound or guarding Skin: Skin is warm and dry. Not diaphoretic.  Psychiatric:  Normal mood and affect. Behavior is normal.    Lab Results  Component Value Date   WBC 4.9 07/29/2021   HGB 13.1 07/29/2021   HCT 41.1 07/29/2021   PLT 243 07/29/2021   GLUCOSE 146 (H) 07/29/2021   CHOL 164 07/30/2021   TRIG 112  07/30/2021   HDL 47 07/30/2021   LDLDIRECT 75.0 11/02/2019   LDLCALC 95 07/30/2021   ALT 14 07/29/2021   AST 20 07/29/2021   NA 139 07/29/2021   K 3.6 07/29/2021   CL 103 07/29/2021   CREATININE 0.99 07/29/2021   BUN 18 07/29/2021   CO2 27 07/29/2021   TSH 0.92 02/19/2021   INR 2.4 (H) 07/30/2021   HGBA1C 5.6 07/30/2021    MR BRAIN WO CONTRAST CLINICAL DATA:  Initial evaluation for neuro deficit, stroke suspected.  EXAM: MRI HEAD WITHOUT CONTRAST  TECHNIQUE: Multiplanar, multiecho pulse sequences of the brain and surrounding structures were obtained without intravenous contrast.  COMPARISON:  CT from 07/29/2021.  FINDINGS: Brain: Cerebral volume within normal limits for age. Scattered patchy T2/FLAIR hyperintensity seen involving the periventricular, deep, and subcortical white matter both cerebral hemispheres, most like related chronic microvascular ischemic disease, moderate in nature.  No abnormal foci of restricted diffusion to suggest acute or subacute ischemia. Gray-white matter differentiation maintained. No encephalomalacia to suggest chronic cortical infarction. No acute intracranial hemorrhage. Few scattered punctate chronic micro hemorrhages noted involving both cerebral hemispheres, likely hypertensive in nature.  1.6 cm calcified meningioma overlies the left frontal convexity without significant edema or mass effect. No other mass lesion, mass effect or midline shift. No hydrocephalus or extra-axial fluid collection. Pituitary gland suprasellar region within normal limits. Midline structures intact and normal.  Vascular: Major intracranial vascular flow voids are maintained.  Skull and upper cervical spine:  Craniocervical junction within normal limits. Bone marrow signal intensity normal. No scalp soft tissue abnormality  Sinuses/Orbits: Prior bilateral ocular lens replacement. Chronic left sphenoid sinusitis noted. Scattered mucosal thickening noted elsewhere throughout the paranasal sinuses. No mastoid effusion. Inner ear structures grossly normal.  Other: None.  IMPRESSION: 1. No acute intracranial abnormality. 2. Age-related cerebral atrophy with moderate chronic microvascular ischemic disease. 3. 1.6 cm calcified meningioma overlying the left frontal convexity without significant edema or mass effect.  Electronically Signed   By: Jeannine Boga M.D.   On: 07/30/2021 03:46   Assessment & Plan:    See Problem List for Assessment and Plan of chronic medical problems.

## 2021-08-05 NOTE — Addendum Note (Signed)
Addended byTomi Likens, Caelyn Route R on: 08/05/2021 08:29 AM   Modules accepted: Level of Service

## 2021-08-06 ENCOUNTER — Ambulatory Visit (INDEPENDENT_AMBULATORY_CARE_PROVIDER_SITE_OTHER): Payer: PPO | Admitting: Internal Medicine

## 2021-08-06 ENCOUNTER — Other Ambulatory Visit: Payer: Self-pay

## 2021-08-06 ENCOUNTER — Encounter: Payer: Self-pay | Admitting: Internal Medicine

## 2021-08-06 VITALS — BP 140/74 | HR 52 | Temp 98.5°F | Ht 63.0 in | Wt 131.0 lb

## 2021-08-06 DIAGNOSIS — I10 Essential (primary) hypertension: Secondary | ICD-10-CM

## 2021-08-06 DIAGNOSIS — H532 Diplopia: Secondary | ICD-10-CM

## 2021-08-06 DIAGNOSIS — R197 Diarrhea, unspecified: Secondary | ICD-10-CM | POA: Diagnosis not present

## 2021-08-06 NOTE — Progress Notes (Signed)
Cardiac Individual Treatment Plan  Patient Details  Name: [name] [name] MRN: [id number] Date of Birth: 1946/02/14 Referring Provider:   Flowsheet Row CARDIAC REHAB PHASE II ORIENTATION from 07/11/2021 in [facility]  Referring Provider Quay Burow, MD       Initial Encounter Date:  [place] PHASE II ORIENTATION from 07/11/2021 in [facility]  Date 07/11/21       Visit Diagnosis: 05/21/21 S/P mitral valve repair, Minimally invasive  Patient's Home Medications on Admission:  Current Outpatient Medications:    acetaminophen (TYLENOL) 500 MG tablet, Take 500 mg by mouth every 4 (four) hours as needed for mild pain., Disp: , Rfl:    aspirin EC 81 MG EC tablet, Take 1 tablet (81 mg total) by mouth daily. Swallow whole., Disp: 30 tablet, Rfl: 11   atorvastatin (LIPITOR) 40 MG tablet, Take 40 mg by mouth daily., Disp: , Rfl:    gabapentin (NEURONTIN) 100 MG capsule, Take 1 capsule in morning and 2 capsules at bedtime (Patient taking differently: Patient is taking 200 mg at night), Disp: 90 capsule, Rfl: 5   metoprolol tartrate (LOPRESSOR) 25 MG tablet, Take 0.5 tablets (12.5 mg total) by mouth 2 (two) times daily., Disp: 60 tablet, Rfl: 3   warfarin (COUMADIN) 2 MG tablet, Take 1-2 tablets Daily or as prescribed by Clinic (Patient taking differently: Take 3 mg by mouth daily. At 1600), Disp: 60 tablet, Rfl: 1  Past Medical History: Past Medical History:  Diagnosis Date   Arthritis    Atypical chest pain    Diverticulosis    Gastritis 11/08/2007, 04/12/2012   H pylori bx neg 03/2012   GERD (gastroesophageal reflux disease) 11/08/2007, 04/12/2012   Hepatic cyst    Hiatal hernia 11/08/2007, 04/12/2012   History of kidney stones    Hyperlipidemia    Hypertension    IBS (irritable bowel syndrome)    diarrhea predom   Migraine    Mitral regurgitation    Osteoporosis    DEXA 01/21/2012: -2.1 spine and R  fem, -1.8 L fem s/p fosamax  2004-2011 DEXA 04/18/14 @ [facility]: -2.5 - rec to start Prolia     PVC's (premature ventricular contractions)    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   Complex valvuloplasty including PTFE neochord placement x6 with 30 mm Medtronic Simuform ring annuloplasty with clipping of LA appendage   Schatzki's ring 11/11/2010   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   Shingles outbreak 04/2013    Tobacco Use: Social History   Tobacco Use  Smoking Status Never  Smokeless Tobacco Never    Labs: Recent Review Flowsheet Data     Labs for ITP Cardiac and Pulmonary Rehab Latest Ref Rng & Units 05/21/2021 05/21/2021 05/22/2021 05/23/2021 07/30/2021   Cholestrol 0 - 200 mg/dL - - - - 164   LDLCALC 0 - 99 mg/dL - - - - 95   LDLDIRECT mg/dL - - - - -   HDL >40 mg/dL - - - - 47   Trlycerides <150 mg/dL - - - - 112   Hemoglobin A1c 4.8 - 5.6 % - - - - 5.6   PHART 7.350 - 7.450 7.423 7.313(L) - - -   PCO2ART 32.0 - 48.0 mmHg 32.7 46.3 - - -   HCO3 20.0 - 28.0 mmol/L 21.3 24.0 - - -   TCO2 22 - 32 mmol/L 22 25 - - -   ACIDBASEDEF 0.0 - 2.0 mmol/L 3.0(H) 3.0(H) - - -  O2SAT % 99.0 98.0 67.4 62.6 -       Capillary Blood Glucose: Lab Results  Component Value Date   GLUCAP 108 (H) 07/30/2021   GLUCAP 174 (H) 07/29/2021   GLUCAP 129 (H) 05/23/2021   GLUCAP 171 (H) 05/23/2021   GLUCAP 111 (H) 05/23/2021     Exercise Target Goals: Exercise Program Goal: Individual exercise prescription set using results from initial 6 min walk test and THRR while considering  patient's activity barriers and safety.   Exercise Prescription Goal: Starting with aerobic activity 30 plus minutes a day, 3 days per week for initial exercise prescription. Provide home exercise prescription and guidelines that participant acknowledges understanding prior to discharge.  Activity Barriers & Risk Stratification:  Activity Barriers & Cardiac Risk Stratification - 07/11/21 0925        Activity Barriers & Cardiac Risk Stratification   Activity Barriers Arthritis;Joint Problems;Balance Concerns    Cardiac Risk Stratification Moderate             6 Minute Walk:  6 Minute Walk     Row Name 07/11/21 0835         6 Minute Walk   Phase Initial     Distance 1347 feet     Walk Time 6 minutes     # of Rest Breaks 0     MPH 2.55     METS 2.68     RPE 9     Perceived Dyspnea  0     VO2 Peak 9.38     Symptoms No     Resting HR 98 bpm     Resting BP 114/76     Resting Oxygen Saturation  97 %     Exercise Oxygen Saturation  during 6 min walk 96 %     Max Ex. HR 103 bpm     Max Ex. BP 130/78     2 Minute Post BP 120/80              Oxygen Initial Assessment:   Oxygen Re-Evaluation:   Oxygen Discharge (Final Oxygen Re-Evaluation):   Initial Exercise Prescription:  Initial Exercise Prescription - 07/11/21 0900       Date of Initial Exercise RX and Referring Provider   Date 07/11/21    Referring Provider Quay Burow, MD    Expected Discharge Date 09/06/21      NuStep   Level 2    SPM 85    Minutes 15    METs 2      Track   Laps 14    Minutes 15    METs 2.62      Prescription Details   Frequency (times per week) 3    Duration Progress to 30 minutes of continuous aerobic without signs/symptoms of physical distress      Intensity   THRR 40-80% of Max Heartrate 58-115    Ratings of Perceived Exertion 11-13    Perceived Dyspnea 0-4      Progression   Progression Continue progressive overload as per policy without signs/symptoms or physical distress.      Resistance Training   Training Prescription Yes    Weight 2 lbs    Reps 10-15             Perform Capillary Blood Glucose checks as needed.  Exercise Prescription Changes:   Exercise Prescription Changes     Row Name 07/15/21 1000             Response to  Exercise   Blood Pressure (Admit) 116/64       Blood Pressure (Exercise) 124/78       Blood Pressure (Exit)  124/80       Heart Rate (Admit) 77 bpm       Heart Rate (Exercise) 84 bpm       Heart Rate (Exit) 80 bpm       Rating of Perceived Exertion (Exercise) 11       Symptoms Left knee pain, chronic       Comments Pt's first day in the CRP2 program       Duration Continue with 30 min of aerobic exercise without signs/symptoms of physical distress.       Intensity THRR unchanged               Progression   Progression Continue to progress workloads to maintain intensity without signs/symptoms of physical distress.       Average METs 2.25               Resistance Training   Training Prescription Yes       Weight 2 lbs       Reps 10-15       Time 10 Minutes               Interval Training   Interval Training No               NuStep   Level 2       SPM 85       Minutes 15       METs 2               Track   Laps 13       Minutes 15       METs 2.51                Exercise Comments:   Exercise Comments     Row Name 07/15/21 1017 07/15/21 1025 08/02/21 0814       Exercise Comments Pt's first day in the CRP2 program. Pt tolerated session well with no complaints. Pt's first day in the CRP2 program. Pt tolerated session well with no complaints other then her chroinc knee pain. Pt was wearing/will wear knee braces to exercise. Pt was due for MET review and the home exercise Rx. Pt expereinced double vision and was seen in the ER. Pt is awaitng appt with neurology. will review upon patient return.              Exercise Goals and Review:   Exercise Goals     Row Name 07/11/21 0927             Exercise Goals   Increase Physical Activity Yes       Intervention Provide advice, education, support and counseling about physical activity/exercise needs.;Develop an individualized exercise prescription for aerobic and resistive training based on initial evaluation findings, risk stratification, comorbidities and participant's personal goals.       Expected Outcomes Short  Term: Attend rehab on a regular basis to increase amount of physical activity.;Long Term: Add in home exercise to make exercise part of routine and to increase amount of physical activity.;Long Term: Exercising regularly at least 3-5 days a week.       Increase Strength and Stamina Yes       Intervention Provide advice, education, support and counseling about physical activity/exercise needs.;Develop an individualized exercise prescription for aerobic and resistive training  based on initial evaluation findings, risk stratification, comorbidities and participant's personal goals.       Expected Outcomes Short Term: Increase workloads from initial exercise prescription for resistance, speed, and METs.;Short Term: Perform resistance training exercises routinely during rehab and add in resistance training at home;Long Term: Improve cardiorespiratory fitness, muscular endurance and strength as measured by increased METs and functional capacity (6MWT)       Able to understand and use rate of perceived exertion (RPE) scale Yes       Intervention Provide education and explanation on how to use RPE scale       Expected Outcomes Short Term: Able to use RPE daily in rehab to express subjective intensity level;Long Term:  Able to use RPE to guide intensity level when exercising independently       Knowledge and understanding of Target Heart Rate Range (THRR) Yes       Intervention Provide education and explanation of THRR including how the numbers were predicted and where they are located for reference       Expected Outcomes Short Term: Able to state/look up THRR;Short Term: Able to use daily as guideline for intensity in rehab;Long Term: Able to use THRR to govern intensity when exercising independently       Understanding of Exercise Prescription Yes       Intervention Provide education, explanation, and written materials on patient's individual exercise prescription       Expected Outcomes Short Term: Able to  explain program exercise prescription;Long Term: Able to explain home exercise prescription to exercise independently                Exercise Goals Re-Evaluation :  Exercise Goals Re-Evaluation     Row Name 07/15/21 1015             Exercise Goal Re-Evaluation   Exercise Goals Review Increase Physical Activity;Increase Strength and Stamina;Able to understand and use rate of perceived exertion (RPE) scale;Knowledge and understanding of Target Heart Rate Range (THRR);Understanding of Exercise Prescription       Comments Pt's first day in the CRP2 program. Pt understands the exercise Rx, RPE scale, and THRR.       Expected Outcomes Will continue to monitor and progress exercise workloads as tolerated.                 Discharge Exercise Prescription (Final Exercise Prescription Changes):  Exercise Prescription Changes - 07/15/21 1000       Response to Exercise   Blood Pressure (Admit) 116/64    Blood Pressure (Exercise) 124/78    Blood Pressure (Exit) 124/80    Heart Rate (Admit) 77 bpm    Heart Rate (Exercise) 84 bpm    Heart Rate (Exit) 80 bpm    Rating of Perceived Exertion (Exercise) 11    Symptoms Left knee pain, chronic    Comments Pt's first day in the CRP2 program    Duration Continue with 30 min of aerobic exercise without signs/symptoms of physical distress.    Intensity THRR unchanged      Progression   Progression Continue to progress workloads to maintain intensity without signs/symptoms of physical distress.    Average METs 2.25      Resistance Training   Training Prescription Yes    Weight 2 lbs    Reps 10-15    Time 10 Minutes      Interval Training   Interval Training No      NuStep   Level 2  SPM 85    Minutes 15    METs 2      Track   Laps 13    Minutes 15    METs 2.51             Nutrition:  Target Goals: Understanding of nutrition guidelines, daily intake of sodium <1532m, cholesterol <2045m calories 30% from fat and 7%  or less from saturated fats, daily to have 5 or more servings of fruits and vegetables.  Biometrics:  Pre Biometrics - 07/11/21 0800       Pre Biometrics   Waist Circumference 35 inches    Hip Circumference 40.25 inches    Waist to Hip Ratio 0.87 %    Triceps Skinfold 30 mm    % Body Fat 39.1 %    Grip Strength 21 kg    Flexibility 12.75 in    Single Leg Stand 12.68 seconds              Nutrition Therapy Plan and Nutrition Goals:  Nutrition Therapy & Goals - 07/17/21 1008       Nutrition Therapy   Diet TLC    Drug/Food Interactions Statins/Certain Fruits;Coumadin/Vit K      Personal Nutrition Goals   Nutrition Goal Pt to build a healthy plate including vegetables, fruits, whole grains, and low-fat dairy products in a heart healthy meal plan.    Personal Goal #2 Pt to eat fish 2x/week    Personal Goal #3 Pt to eat more beans and nuts      Intervention Plan   Intervention Prescribe, educate and counsel regarding individualized specific dietary modifications aiming towards targeted core components such as weight, hypertension, lipid management, diabetes, heart failure and other comorbidities.;Nutrition handout(s) given to patient.    Expected Outcomes Long Term Goal: Adherence to prescribed nutrition plan.;Short Term Goal: A plan has been developed with personal nutrition goals set during dietitian appointment.             Nutrition Assessments:  MEDIFICTS Score Key: ?70 Need to make dietary changes  40-70 Heart Healthy Diet ? 40 Therapeutic Level Cholesterol Diet  Flowsheet Row [facility]      Picture Your Plate Scores: <4<04nhealthy dietary pattern with much room for improvement. 41-50 Dietary pattern unlikely to meet recommendations for good health and room for improvement. 51-60 More healthful dietary pattern, with some room  for improvement.  >60 Healthy dietary pattern, although there may be some specific behaviors that could be improved.    Nutrition Goals Re-Evaluation:  Nutrition Goals Re-Evaluation     [place] 07/17/21 1009 08/05/21 0944           Goals   Current Weight 133 lb (60.3 kg) 135 lb 9.3 oz (61.5 kg)      Nutrition Goal Pt to build a healthy plate including vegetables, fruits, whole grains, and low-fat dairy products in a heart healthy meal plan. Pt to build a healthy plate including vegetables, fruits, whole grains, and low-fat dairy products in a heart healthy meal plan.             Personal Goal #2 Re-Evaluation   Personal Goal #2 Pt to eat fish 2x/week Pt to eat fish 2x/week             Personal Goal #3 Re-Evaluation   Personal Goal #3 Pt to eat more beans and  nuts Pt to eat more beans and nuts               Nutrition Goals Discharge (Final Nutrition Goals Re-Evaluation):  Nutrition Goals Re-Evaluation - 08/05/21 0944       Goals   Current Weight 135 lb 9.3 oz (61.5 kg)    Nutrition Goal Pt to build a healthy plate including vegetables, fruits, whole grains, and low-fat dairy products in a heart healthy meal plan.      Personal Goal #2 Re-Evaluation   Personal Goal #2 Pt to eat fish 2x/week      Personal Goal #3 Re-Evaluation   Personal Goal #3 Pt to eat more beans and nuts             Psychosocial: Target Goals: Acknowledge presence or absence of significant depression and/or stress, maximize coping skills, provide positive support system. Participant is able to verbalize types and ability to use techniques and skills needed for reducing stress and depression.  Initial Review & Psychosocial Screening:  Initial Psych Review & Screening - 08/06/21 1434       Initial Review   Current issues with Current Stress Concerns    Comments Leasha's exercise is currently on hold due to recent health issues.      Family Dynamics   Good Support System? Yes   Costella has  her husband and children for support     Barriers   Psychosocial barriers to participate in program There are no identifiable barriers or psychosocial needs.      Screening Interventions   Interventions Encouraged to exercise             Quality of Life Scores:  Quality of Life - 07/11/21 0940       Quality of Life   Select Quality of Life      Quality of Life Scores   Health/Function Pre 21.13 %    Socioeconomic Pre 23.92 %    Psych/Spiritual Pre 24.43 %    Family Pre 27.5 %    GLOBAL Pre 23.3 %            Scores of 19 and below usually indicate a poorer quality of life in these areas.  A difference of  2-3 points is a clinically meaningful difference.  A difference of 2-3 points in the total score of the Quality of Life Index has been associated with significant improvement in overall quality of life, self-image, physical symptoms, and general health in studies assessing change in quality of life.  PHQ-9: Recent Review Flowsheet Data     Depression screen [facility] 2/9 07/11/2021 07/11/2021 04/24/2020 05/02/2019 07/20/2017   Decreased Interest 0 0 0 0 0   Down, Depressed, Hopeless 0 0 0 0 0   PHQ - 2 Score 0 0 0 0 0      Interpretation of Total Score  Total Score Depression Severity:  1-4 = Minimal depression, 5-9 = Mild depression, 10-14 = Moderate depression, 15-19 = Moderately severe depression, 20-27 = Severe depression   Psychosocial Evaluation and Intervention:   Psychosocial Re-Evaluation:  Psychosocial Re-Evaluation     Martin Name 07/15/21 1110             Psychosocial Re-Evaluation   Current issues with None Identified       Interventions Encouraged to attend Cardiac Rehabilitation for the exercise       Continue Psychosocial Services  No Follow up required  Psychosocial Discharge (Final Psychosocial Re-Evaluation):  Psychosocial Re-Evaluation - 07/15/21 1110       Psychosocial Re-Evaluation   Current issues with None Identified     Interventions Encouraged to attend Cardiac Rehabilitation for the exercise    Continue Psychosocial Services  No Follow up required             Vocational Rehabilitation: Provide vocational rehab assistance to qualifying candidates.   Vocational Rehab Evaluation & Intervention:  Vocational Rehab - 07/11/21 8453       Initial Vocational Rehab Evaluation & Intervention   Assessment shows need for Vocational Rehabilitation No   Tyja is retired and does not need vocational rehab at this time            Education: Education Goals: Education classes will be provided on a weekly basis, covering required topics. Participant will state understanding/return demonstration of topics presented.  Learning Barriers/Preferences:  Learning Barriers/Preferences - 07/11/21 6468       Learning Barriers/Preferences   Learning Barriers Sight   wears glasses   Learning Preferences Written Material;Computer/Internet;Pictoral;Video             Education Topics: Hypertension, Hypertension Reduction -Define heart disease and high blood pressure. Discus how high blood pressure affects the body and ways to reduce high blood pressure.   Exercise and Your Heart -Discuss why it is important to exercise, the FITT principles of exercise, normal and abnormal responses to exercise, and how to exercise safely.   Angina -Discuss definition of angina, causes of angina, treatment of angina, and how to decrease risk of having angina.   Cardiac Medications -Review what the following cardiac medications are used for, how they affect the body, and side effects that may occur when taking the medications.  Medications include Aspirin, Beta blockers, calcium channel blockers, ACE Inhibitors, angiotensin receptor blockers, diuretics, digoxin, and antihyperlipidemics.   Congestive Heart Failure -Discuss the definition of CHF, how to live with CHF, the signs and symptoms of CHF, and how keep track of  weight and sodium intake.   Heart Disease and Intimacy -Discus the effect sexual activity has on the heart, how changes occur during intimacy as we age, and safety during sexual activity.   Smoking Cessation / COPD -Discuss different methods to quit smoking, the health benefits of quitting smoking, and the definition of COPD.   Nutrition I: Fats -Discuss the types of cholesterol, what cholesterol does to the heart, and how cholesterol levels can be controlled.   Nutrition II: Labels -Discuss the different components of food labels and how to read food label   Heart Parts/Heart Disease and PAD -Discuss the anatomy of the heart, the pathway of blood circulation through the heart, and these are affected by heart disease.   Stress I: Signs and Symptoms -Discuss the causes of stress, how stress may lead to anxiety and depression, and ways to limit stress.   Stress II: Relaxation -Discuss different types of relaxation techniques to limit stress.   Warning Signs of Stroke / TIA -Discuss definition of a stroke, what the signs and symptoms are of a stroke, and how to identify when someone is having stroke.   Knowledge Questionnaire Score:  Knowledge Questionnaire Score - 07/11/21 0941       Knowledge Questionnaire Score   Pre Score 19/24             Core Components/Risk Factors/Patient Goals at Admission:  Personal Goals and Risk Factors at Admission - 07/11/21 0321  Core Components/Risk Factors/Patient Goals on Admission    Weight Management Weight Maintenance;Yes    Intervention Weight Management: Develop a combined nutrition and exercise program designed to reach desired caloric intake, while maintaining appropriate intake of nutrient and fiber, sodium and fats, and appropriate energy expenditure required for the weight goal.;Weight Management: Provide education and appropriate resources to help participant work on and attain dietary goals.    Admit Weight 133 lb  13.1 oz (60.7 kg)    Expected Outcomes Weight Maintenance: Understanding of the daily nutrition guidelines, which includes 25-35% calories from fat, 7% or less cal from saturated fats, less than 2102m cholesterol, less than 1.5gm of sodium, & 5 or more servings of fruits and vegetables daily;Understanding of distribution of calorie intake throughout the day with the consumption of 4-5 meals/snacks;Understanding recommendations for meals to include 15-35% energy as protein, 25-35% energy from fat, 35-60% energy from carbohydrates, less than 2035mof dietary cholesterol, 20-35 gm of total fiber daily    Hypertension Yes    Intervention Provide education on lifestyle modifcations including regular physical activity/exercise, weight management, moderate sodium restriction and increased consumption of fresh fruit, vegetables, and low fat dairy, alcohol moderation, and smoking cessation.;Monitor prescription use compliance.    Expected Outcomes Short Term: Continued assessment and intervention until BP is < 140/9053mG in hypertensive participants. < 130/63m64m in hypertensive participants with diabetes, heart failure or chronic kidney disease.;Long Term: Maintenance of blood pressure at goal levels.    Lipids Yes    Intervention Provide education and support for participant on nutrition & aerobic/resistive exercise along with prescribed medications to achieve LDL <70mg74mL >40mg.42mExpected Outcomes Short Term: Participant states understanding of desired cholesterol values and is compliant with medications prescribed. Participant is following exercise prescription and nutrition guidelines.;Long Term: Cholesterol controlled with medications as prescribed, with individualized exercise RX and with personalized nutrition plan. Value goals: LDL < 70mg, 63m> 40 mg.             Core Components/Risk Factors/Patient Goals Review:   Goals and Risk Factor Review     Row Name 07/15/21 1111 08/06/21 1436            Core Components/Risk Factors/Patient Goals Review   Personal Goals Review Weight Management/Obesity;Hypertension;Lipids Weight Management/Obesity;Hypertension;Lipids      Review Stephania Virgied exercise at cardiac rehab on 07/15/21 and did well with exercise. Clint's last day of exercise at cardiac rehab was on 07/26/21. Quita's exercise is currently on hold.      Expected Outcomes Taresa Shereeceontinue to partcipate in phase 2 exercise at cardiac rehab for exercise, nutrition and lifestyle modifications Joleena Fletaontinue to partcipate in phase 2 exercise at cardiac rehab for exercise, nutrition and lifestyle modifications when cleared to return to exercise.               Core Components/Risk Factors/Patient Goals at Discharge (Final Review):   Goals and Risk Factor Review - 08/06/21 1436       Core Components/Risk Factors/Patient Goals Review   Personal Goals Review Weight Management/Obesity;Hypertension;Lipids    Review Brittay's last day of exercise at cardiac rehab was on 07/26/21. Creasie's exercise is currently on hold.    Expected Outcomes Ave Yuliaontinue to partcipate in phase 2 exercise at cardiac rehab for exercise, nutrition and lifestyle modifications when cleared to return to exercise.             ITP Comments:  ITP Comments     Row  Name 07/11/21 0830 07/15/21 1104 08/06/21 1433       ITP Comments Dr Fransico Him MD, Medical Director 30 Day ITP Review. Xanthe started exercise at cardiac rehab on 07/15/21 and did well with exercise. 30 Day ITP Review. Kem's exercise has been on hold since her recent ED visit. Zyonna continues to report experiencing double vision.              Comments: See ITP comments. Exercise is currently on hold.Harrell Gave RN BSN

## 2021-08-06 NOTE — Assessment & Plan Note (Signed)
Chronic BP Readings from Last 3 Encounters:  08/06/21 140/74  08/02/21 131/79  07/30/21 127/88   Blood pressure has been controlled Continue metoprolol 12.5 mg twice daily

## 2021-08-06 NOTE — Assessment & Plan Note (Signed)
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

## 2021-08-06 NOTE — Patient Instructions (Signed)
    Give a stool sample downstairs.    Start a probiotic.     Update me with your symptoms.

## 2021-08-06 NOTE — Assessment & Plan Note (Signed)
Subacute Started about 3 weeks ago Having several episodes a day and at night Taking Imodium as needed Yesterday and today only 1 episode each day so far-?  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

## 2021-08-07 ENCOUNTER — Encounter (HOSPITAL_COMMUNITY): Payer: PPO

## 2021-08-08 ENCOUNTER — Other Ambulatory Visit: Payer: Self-pay | Admitting: Cardiology

## 2021-08-08 NOTE — Telephone Encounter (Signed)
This is Dr. Berry's pt 

## 2021-08-09 ENCOUNTER — Telehealth: Payer: Self-pay | Admitting: Internal Medicine

## 2021-08-09 ENCOUNTER — Encounter: Payer: Self-pay | Admitting: Internal Medicine

## 2021-08-09 ENCOUNTER — Encounter (HOSPITAL_COMMUNITY): Payer: PPO

## 2021-08-09 LAB — GI PROFILE, STOOL, PCR
Adenovirus F 40/41: NOT DETECTED
Astrovirus: NOT DETECTED
C difficile toxin A/B: NOT DETECTED
Campylobacter: NOT DETECTED
Cryptosporidium: NOT DETECTED
Cyclospora cayetanensis: NOT DETECTED
Entamoeba histolytica: NOT DETECTED
Enteroaggregative E coli: DETECTED — AB
Enteropathogenic E coli: DETECTED — AB
Enterotoxigenic E coli: NOT DETECTED
Giardia lamblia: NOT DETECTED
Norovirus GI/GII: NOT DETECTED
Plesiomonas shigelloides: NOT DETECTED
Rotavirus A: NOT DETECTED
Salmonella: NOT DETECTED
Sapovirus: NOT DETECTED
Shiga-toxin-producing E coli: NOT DETECTED
Shigella/Enteroinvasive E coli: NOT DETECTED
Vibrio cholerae: NOT DETECTED
Vibrio: NOT DETECTED
Yersinia enterocolitica: NOT DETECTED

## 2021-08-09 NOTE — Telephone Encounter (Signed)
Spoke with patient today and she declines having diarrhea still.  She has been informed to call us back if it returns.

## 2021-08-09 NOTE — Telephone Encounter (Signed)
Please call her and see how her diarrhea is.  Her stool culture shows some abnormal bacteria.  If her diarrhea is better we do not need to treat, but if she is still experiencing diarrhea I will send an antibiotic to her pharmacy.  It will be a one-time dose only.  She should continue probiotic pills to help rebalance her gut bacteria

## 2021-08-12 ENCOUNTER — Telehealth (HOSPITAL_COMMUNITY): Payer: Self-pay | Admitting: *Deleted

## 2021-08-12 ENCOUNTER — Encounter (HOSPITAL_COMMUNITY): Payer: PPO

## 2021-08-12 ENCOUNTER — Ambulatory Visit (INDEPENDENT_AMBULATORY_CARE_PROVIDER_SITE_OTHER): Payer: PPO

## 2021-08-12 ENCOUNTER — Other Ambulatory Visit: Payer: Self-pay

## 2021-08-12 DIAGNOSIS — I340 Nonrheumatic mitral (valve) insufficiency: Secondary | ICD-10-CM

## 2021-08-12 DIAGNOSIS — Z9889 Other specified postprocedural states: Secondary | ICD-10-CM | POA: Diagnosis not present

## 2021-08-12 DIAGNOSIS — Z5181 Encounter for therapeutic drug level monitoring: Secondary | ICD-10-CM | POA: Diagnosis not present

## 2021-08-12 DIAGNOSIS — Z7901 Long term (current) use of anticoagulants: Secondary | ICD-10-CM

## 2021-08-12 LAB — POCT INR: INR: 4.9 — AB (ref 2.0–3.0)

## 2021-08-12 NOTE — Patient Instructions (Signed)
Hold tonight and Tuesday only and then continue 1 tablet Daily, except 1.5 tablets on Monday and Friday.  INR in 1 week. [phone]

## 2021-08-12 NOTE — Telephone Encounter (Signed)
Spoke with [name] [name] she has an appointment with the neurologist opthamolgist tomorrow in [place]. [name] [name] said she will stop participation in phase 2 cardiac rehab at this time as she continues to have issues with double vision. Will discharge from phase 2 cardiac rehab at this time. [name] [name] attended 5 exercise sessions between 07/15/21- 07/26/21. [name] [name] may decide to return to phase 2 cardiac rehab at a later date and time when she is feeling better.Barnet Pall, RN,BSN 08/12/2021 4:30 PM

## 2021-08-13 DIAGNOSIS — H532 Diplopia: Secondary | ICD-10-CM | POA: Diagnosis not present

## 2021-08-13 NOTE — Addendum Note (Signed)
Encounter addended by: Lesly Rubenstein on: 08/13/2021 12:01 PM  Actions taken: Flowsheet accepted

## 2021-08-13 NOTE — Addendum Note (Signed)
Encounter addended by: Lesly Rubenstein on: 08/13/2021 11:59 AM  Actions taken: Flowsheet data copied forward, Flowsheet accepted

## 2021-08-14 ENCOUNTER — Encounter (HOSPITAL_COMMUNITY): Payer: PPO

## 2021-08-14 DIAGNOSIS — H5021 Vertical strabismus, right eye: Secondary | ICD-10-CM | POA: Diagnosis not present

## 2021-08-14 DIAGNOSIS — H4911 Fourth [trochlear] nerve palsy, right eye: Secondary | ICD-10-CM | POA: Diagnosis not present

## 2021-08-14 DIAGNOSIS — H40013 Open angle with borderline findings, low risk, bilateral: Secondary | ICD-10-CM | POA: Diagnosis not present

## 2021-08-14 DIAGNOSIS — H532 Diplopia: Secondary | ICD-10-CM | POA: Diagnosis not present

## 2021-08-15 ENCOUNTER — Ambulatory Visit (HOSPITAL_COMMUNITY): Payer: PPO

## 2021-08-16 ENCOUNTER — Encounter (HOSPITAL_COMMUNITY): Payer: PPO

## 2021-08-18 ENCOUNTER — Encounter: Payer: Self-pay | Admitting: Internal Medicine

## 2021-08-18 DIAGNOSIS — H4911 Fourth [trochlear] nerve palsy, right eye: Secondary | ICD-10-CM | POA: Insufficient documentation

## 2021-08-18 HISTORY — DX: Fourth (trochlear) nerve palsy, right eye: H49.11

## 2021-08-19 ENCOUNTER — Other Ambulatory Visit: Payer: Self-pay

## 2021-08-19 ENCOUNTER — Telehealth (HOSPITAL_COMMUNITY): Payer: Self-pay | Admitting: Internal Medicine

## 2021-08-19 ENCOUNTER — Ambulatory Visit (INDEPENDENT_AMBULATORY_CARE_PROVIDER_SITE_OTHER): Payer: PPO

## 2021-08-19 ENCOUNTER — Encounter (HOSPITAL_COMMUNITY): Payer: PPO

## 2021-08-19 ENCOUNTER — Telehealth (HOSPITAL_COMMUNITY): Payer: Self-pay | Admitting: *Deleted

## 2021-08-19 ENCOUNTER — Ambulatory Visit (HOSPITAL_COMMUNITY): Payer: PPO

## 2021-08-19 DIAGNOSIS — I340 Nonrheumatic mitral (valve) insufficiency: Secondary | ICD-10-CM

## 2021-08-19 DIAGNOSIS — Z5181 Encounter for therapeutic drug level monitoring: Secondary | ICD-10-CM | POA: Diagnosis not present

## 2021-08-19 DIAGNOSIS — Z9889 Other specified postprocedural states: Secondary | ICD-10-CM

## 2021-08-19 DIAGNOSIS — Z7901 Long term (current) use of anticoagulants: Secondary | ICD-10-CM

## 2021-08-19 LAB — POCT INR: INR: 2 (ref 2.0–3.0)

## 2021-08-19 MED ORDER — WARFARIN SODIUM 2 MG PO TABS
ORAL_TABLET | ORAL | 1 refills | Status: DC
Start: 2021-08-19 — End: 2022-03-03

## 2021-08-19 NOTE — Telephone Encounter (Signed)
Spoke with [name] [name] she reports that she is feeling better but still having visual changes. Will need clearance from Neurology and Cardiology before returning to exercise at phase 2 cardiac rehab. Patient states understanding.Barnet Pall, RN,BSN 08/19/2021 9:07 AM

## 2021-08-19 NOTE — Patient Instructions (Signed)
continue 1 tablet Daily, except 1.5 tablets on Monday and Friday.  INR in 3 weeks. [phone]

## 2021-08-21 ENCOUNTER — Encounter (HOSPITAL_COMMUNITY): Payer: PPO

## 2021-08-21 ENCOUNTER — Ambulatory Visit (HOSPITAL_COMMUNITY): Payer: PPO

## 2021-08-22 ENCOUNTER — Telehealth: Payer: Self-pay | Admitting: Pharmacist

## 2021-08-22 ENCOUNTER — Other Ambulatory Visit: Payer: Self-pay | Admitting: Internal Medicine

## 2021-08-22 NOTE — Progress Notes (Signed)
    Chronic Care Management Pharmacy Assistant   Name: [name] [name]  MRN: [id number] DOB: November 08, 1946   Reason for Encounter: Medication Review    Recent office visits:  08/06/21 Binnie Rail, MD (PCP) Binocular vision disorder with diplopia Med change: start probiotic   Recent consult visits:  08/02/21 Pieter Partridge, DO-Neurology (Brainstem stroke) med changes: Increase gabapentin to 1 pill in morning and 2 pills at bedtime  Hospital visits:  Medication Reconciliation was completed by comparing discharge summary, patient's EMR and Pharmacy list, and upon discussion with patient.  Admitted to the hospital on 07/29/21 due to Binocular vision disorder with diplopia. Discharge date was 07/30/21. Discharged from [facility].     Medications that remain the same after Hospital Discharge:??  -All other medications will remain the same.    Medications: Outpatient Encounter Medications as of 08/22/2021  Medication Sig   metoprolol succinate (TOPROL-XL) 25 MG 24 hr tablet TAKE ONE TABLET BY MOUTH AT BREAKFAST AND AT BEDTIME   acetaminophen (TYLENOL) 500 MG tablet Take 500 mg by mouth every 4 (four) hours as needed for mild pain.   aspirin EC 81 MG EC tablet Take 1 tablet (81 mg total) by mouth daily. Swallow whole.   atorvastatin (LIPITOR) 40 MG tablet Take 40 mg by mouth daily.   furosemide (LASIX) 20 MG tablet TAKE TWO TABLETS BY MOUTH ONCE daily   gabapentin (NEURONTIN) 100 MG capsule Take 1 capsule in morning and 2 capsules at bedtime (Patient taking differently: Patient is taking 200 mg at night)   metoprolol tartrate (LOPRESSOR) 25 MG tablet Take 0.5 tablets (12.5 mg total) by mouth 2 (two) times daily.   warfarin (COUMADIN) 2 MG tablet Take 1-2 tablets Daily or as prescribed by Clinic   No facility-administered encounter medications on file as of 08/22/2021.   BP Readings from Last 3 Encounters:  08/06/21 140/74  08/02/21 131/79  07/30/21 127/88    Lab Results   Component Value Date   HGBA1C 5.6 07/30/2021      Last adherence delivery date:07/22/21      Patient is due for next adherence delivery on: 09/04/21  Spoke with patient on 08/22/21 reviewed medications and coordinated delivery.  This delivery to include: Vials  30 Days  Atorvastatin 20 mg 1 tab at breakfast Gabapentin 100 mg 1 tab at breakfast and 2 tabs at bedtime Metoprolol 25 mg 1 tabd at breakfast and 1 tab at bedtime Warfarin 2 mg 1-2 tabs daily or as prescribed  Patient declined the following medications this month: Potassium chloride-patient does not need at this time Amiodarone-patient is no longer taking   Any concerns about your medications? No  How often do you forget or accidentally miss a dose? Never  Do you use a pillbox? Yes  Is patient in packaging No   No refill request needed.  Confirmed delivery date of 09/04/21, advised patient that pharmacy will contact them the morning of delivery.  Recent blood pressure readings are as follows:134/75   Annual wellness visit in last year? No Most Recent BP reading:134/75 at home 08/18/21  [place] Pharmacist Assistant [phone]   Time spent:26

## 2021-08-23 ENCOUNTER — Ambulatory Visit (HOSPITAL_COMMUNITY): Payer: PPO

## 2021-08-23 ENCOUNTER — Encounter (HOSPITAL_COMMUNITY): Payer: PPO

## 2021-08-24 ENCOUNTER — Telehealth: Payer: Self-pay

## 2021-08-24 NOTE — Telephone Encounter (Signed)
Last Prolia inj 07/31/21 Next Prolia inj due 01/29/22

## 2021-08-26 ENCOUNTER — Encounter (HOSPITAL_COMMUNITY): Payer: PPO

## 2021-08-26 ENCOUNTER — Ambulatory Visit (HOSPITAL_COMMUNITY): Payer: PPO

## 2021-08-26 DIAGNOSIS — H00024 Hordeolum internum left upper eyelid: Secondary | ICD-10-CM | POA: Diagnosis not present

## 2021-08-28 ENCOUNTER — Encounter (HOSPITAL_COMMUNITY): Payer: PPO

## 2021-08-28 ENCOUNTER — Ambulatory Visit (HOSPITAL_COMMUNITY): Payer: PPO

## 2021-08-30 ENCOUNTER — Encounter (HOSPITAL_COMMUNITY): Payer: PPO

## 2021-08-30 ENCOUNTER — Ambulatory Visit (HOSPITAL_COMMUNITY): Payer: PPO

## 2021-09-01 ENCOUNTER — Other Ambulatory Visit: Payer: Self-pay | Admitting: Internal Medicine

## 2021-09-02 ENCOUNTER — Ambulatory Visit (HOSPITAL_COMMUNITY): Payer: PPO

## 2021-09-02 ENCOUNTER — Encounter (HOSPITAL_COMMUNITY): Payer: PPO

## 2021-09-02 ENCOUNTER — Other Ambulatory Visit: Payer: Self-pay

## 2021-09-02 ENCOUNTER — Ambulatory Visit (INDEPENDENT_AMBULATORY_CARE_PROVIDER_SITE_OTHER): Payer: PPO

## 2021-09-02 DIAGNOSIS — Z5181 Encounter for therapeutic drug level monitoring: Secondary | ICD-10-CM | POA: Diagnosis not present

## 2021-09-02 DIAGNOSIS — I340 Nonrheumatic mitral (valve) insufficiency: Secondary | ICD-10-CM | POA: Diagnosis not present

## 2021-09-02 DIAGNOSIS — Z9889 Other specified postprocedural states: Secondary | ICD-10-CM | POA: Diagnosis not present

## 2021-09-02 DIAGNOSIS — Z7901 Long term (current) use of anticoagulants: Secondary | ICD-10-CM

## 2021-09-02 LAB — POCT INR: INR: 2.3 (ref 2.0–3.0)

## 2021-09-02 NOTE — Patient Instructions (Signed)
continue 1 tablet Daily, except 1.5 tablets on Monday and Friday.  INR in 3 weeks. [phone]

## 2021-09-04 ENCOUNTER — Ambulatory Visit (HOSPITAL_COMMUNITY): Payer: PPO

## 2021-09-04 ENCOUNTER — Encounter (HOSPITAL_COMMUNITY): Payer: PPO

## 2021-09-06 ENCOUNTER — Ambulatory Visit (HOSPITAL_COMMUNITY): Payer: PPO

## 2021-09-06 ENCOUNTER — Encounter (HOSPITAL_COMMUNITY): Payer: PPO

## 2021-09-09 ENCOUNTER — Ambulatory Visit (HOSPITAL_COMMUNITY): Payer: PPO

## 2021-09-11 ENCOUNTER — Ambulatory Visit (HOSPITAL_COMMUNITY): Payer: PPO

## 2021-09-13 ENCOUNTER — Ambulatory Visit (HOSPITAL_COMMUNITY): Payer: PPO

## 2021-09-16 ENCOUNTER — Telehealth: Payer: PPO

## 2021-09-16 ENCOUNTER — Ambulatory Visit (HOSPITAL_COMMUNITY): Payer: PPO

## 2021-09-18 ENCOUNTER — Ambulatory Visit (HOSPITAL_COMMUNITY): Payer: PPO

## 2021-09-20 ENCOUNTER — Ambulatory Visit (HOSPITAL_COMMUNITY): Payer: PPO

## 2021-09-23 ENCOUNTER — Telehealth: Payer: Self-pay | Admitting: Pharmacist

## 2021-09-23 ENCOUNTER — Ambulatory Visit (HOSPITAL_COMMUNITY): Payer: PPO

## 2021-09-23 DIAGNOSIS — H5021 Vertical strabismus, right eye: Secondary | ICD-10-CM | POA: Diagnosis not present

## 2021-09-23 DIAGNOSIS — H4911 Fourth [trochlear] nerve palsy, right eye: Secondary | ICD-10-CM | POA: Diagnosis not present

## 2021-09-23 NOTE — Progress Notes (Signed)
    Chronic Care Management Pharmacy Assistant   Name: [name] [name]  MRN: [id number] DOB: Mar 30, 1946  Reason for Encounter: Medication Review Call   Recent office visits:  None listed  Recent consult visits:  None listed  Hospital visits:  None in previous 6 months  Medications: Outpatient Encounter Medications as of 09/23/2021  Medication Sig   metoprolol succinate (TOPROL-XL) 25 MG 24 hr tablet TAKE ONE TABLET BY MOUTH AT BREAKFAST AND AT BEDTIME   acetaminophen (TYLENOL) 500 MG tablet Take 500 mg by mouth every 4 (four) hours as needed for mild pain.   aspirin EC 81 MG EC tablet Take 1 tablet (81 mg total) by mouth daily. Swallow whole.   atorvastatin (LIPITOR) 40 MG tablet Take 40 mg by mouth daily.   furosemide (LASIX) 20 MG tablet TAKE TWO TABLETS BY MOUTH ONCE daily   gabapentin (NEURONTIN) 100 MG capsule Take 1 capsule in morning and 2 capsules at bedtime (Patient taking differently: Patient is taking 200 mg at night)   metoprolol tartrate (LOPRESSOR) 25 MG tablet Take 0.5 tablets (12.5 mg total) by mouth 2 (two) times daily.   potassium chloride SA (KLOR-CON) 20 MEQ tablet TAKE ONE TABLET BY MOUTH ONCE DAILY   warfarin (COUMADIN) 2 MG tablet Take 1-2 tablets Daily or as prescribed by Clinic   No facility-administered encounter medications on file as of 09/23/2021.   Reviewed chart for medication changes ahead of medication coordination call.  No OVs, Consults, or hospital visits since last care coordination call/Pharmacist visit. (If appropriate, list visit date, provider name)  No medication changes indicated OR if recent visit, treatment plan here.  BP Readings from Last 3 Encounters:  08/06/21 140/74  08/02/21 131/79  07/30/21 127/88    Lab Results  Component Value Date   HGBA1C 5.6 07/30/2021     Patient obtains medications through Vials  30 Days   Patient is due for next adherence delivery on: 10/03/21. Called patient and reviewed medications and  coordinated delivery.  This delivery to include: Atorvastatin 20 mg 1 tab at breakfast Gabapentin 100 mg 1 tab at breakfast and 2 tabs at bedtime Metoprolol 25 mg 1 tabd at breakfast and 1 tab at bedtime Warfarin 2 mg 1-2 tabs daily or as prescribed  Patient declined the following medications: Potassium - no longer taking Furosemide - no longer taking  Confirmed delivery date of 10/03/21, advised patient that pharmacy will contact them the morning of delivery.  Star Rating Drugs: Atorvastatin - filled 09/04/21 [place], [place] Clinical Pharmacists Assistant [phone]

## 2021-09-24 ENCOUNTER — Ambulatory Visit: Payer: PPO | Admitting: Gastroenterology

## 2021-09-25 ENCOUNTER — Ambulatory Visit (HOSPITAL_COMMUNITY): Payer: PPO

## 2021-09-25 NOTE — Addendum Note (Signed)
Addended by: Charlton Haws on: 09/25/2021 05:13 PM   Modules accepted: Orders

## 2021-09-27 ENCOUNTER — Other Ambulatory Visit: Payer: Self-pay

## 2021-09-27 ENCOUNTER — Ambulatory Visit (HOSPITAL_COMMUNITY): Payer: PPO

## 2021-09-27 ENCOUNTER — Ambulatory Visit (INDEPENDENT_AMBULATORY_CARE_PROVIDER_SITE_OTHER): Payer: PPO

## 2021-09-27 ENCOUNTER — Ambulatory Visit: Payer: PPO | Admitting: Cardiovascular Disease

## 2021-09-27 ENCOUNTER — Encounter: Payer: Self-pay | Admitting: Cardiovascular Disease

## 2021-09-27 VITALS — BP 140/78 | HR 42 | Ht 63.0 in | Wt 131.6 lb

## 2021-09-27 DIAGNOSIS — I340 Nonrheumatic mitral (valve) insufficiency: Secondary | ICD-10-CM | POA: Diagnosis not present

## 2021-09-27 DIAGNOSIS — Z9889 Other specified postprocedural states: Secondary | ICD-10-CM

## 2021-09-27 DIAGNOSIS — I480 Paroxysmal atrial fibrillation: Secondary | ICD-10-CM

## 2021-09-27 DIAGNOSIS — I10 Essential (primary) hypertension: Secondary | ICD-10-CM

## 2021-09-27 DIAGNOSIS — Z5181 Encounter for therapeutic drug level monitoring: Secondary | ICD-10-CM

## 2021-09-27 DIAGNOSIS — Z7901 Long term (current) use of anticoagulants: Secondary | ICD-10-CM

## 2021-09-27 LAB — POCT INR: INR: 2.4 (ref 2.0–3.0)

## 2021-09-27 NOTE — Assessment & Plan Note (Signed)
History of severe mitral regurgitation for which she was symptomatic status post right and left heart cath by myself 03/14/2021 revealing normal coronary arteries with a large V wave.  A transesophageal echo performed at the same time revealed a flail posterior leaflet.  She underwent minimally invasive mitral valve repair by Dr. Roxy Manns on 05/21/2021 with a 38 mm Medtronic SIMUFORM ring as well as left atrial appendage clipping.  Her symptoms have not markedly improved.  Her most recent 2D echo performed 07/03/2021 revealed a decline in her LV function to 40 to 45% compared to 55% prior to mitral valve repair although she no longer has mitral regurgitation.  I am going to recheck a 2D echo in 6 months.

## 2021-09-27 NOTE — Progress Notes (Unsigned)
Enrolled patient for a 14 day Zio XT  monitor to be mailed to patients home  °

## 2021-09-27 NOTE — Patient Instructions (Signed)
continue 1 tablet Daily, except 1.5 tablets on Monday and Friday.  INR in 6 weeks. [phone]

## 2021-09-27 NOTE — Patient Instructions (Signed)
Medication Instructions:  -Dr. Gwenlyn Found would like for you to only take metoprolol succinate (Toprol-XL) 25mg  once daily.  Your physician recommends that you continue on your current medications as directed. Please refer to the Current Medication list given to you today.   *If you need a refill on your cardiac medications before your next appointment, please call your pharmacy*   Testing/Procedures: Your physician has requested that you have an echocardiogram. Echocardiography is a painless test that uses sound waves to create images of your heart. It provides your doctor with information about the size and shape of your heart and how well your heart's chambers and valves are working. This procedure takes approximately one hour. There are no restrictions for this procedure. To be done in May/June 2023. This procedure is done at [address] Monitor Instructions  Your physician has requested you wear a ZIO patch monitor for 14 days.  This is a single patch monitor. [employer] supplies one patch monitor per enrollment. Additional stickers are not available. Please do not apply patch if you will be having a Nuclear Stress Test,  Echocardiogram, Cardiac CT, MRI, or Chest Xray during the period you would be wearing the  monitor. The patch cannot be worn during these tests. You cannot remove and re-apply the  ZIO XT patch monitor.  Your ZIO patch monitor will be mailed 3 day USPS to your address on file. It may take 3-5 days  to receive your monitor after you have been enrolled.  Once you have received your monitor, please review the enclosed instructions. Your monitor  has already been registered assigning a specific monitor serial # to you.  Billing and Patient Assistance Program Information  We have supplied [employer] with any of your insurance information on file for billing purposes. [employer] offers a sliding scale Patient Assistance Program for patients that do not have   insurance, or whose insurance does not completely cover the cost of the ZIO monitor.  You must apply for the Patient Assistance Program to qualify for this discounted rate.  To apply, please call [employer] at [phone], select option 4, select option 2, ask to apply for  Patient Assistance Program. [name] [name] will ask your household income, and how many people  are in your household. They will quote your out-of-pocket cost based on that information.  [employer] will also be able to set up a 20-month, interest-free payment plan if needed.  Applying the monitor   Shave hair from upper left chest.  Hold abrader disc by orange tab. Rub abrader in 40 strokes over the upper left chest as  indicated in your monitor instructions.  Clean area with 4 enclosed alcohol pads. Let dry.  Apply patch as indicated in monitor instructions. Patch will be placed under collarbone on left  side of chest with arrow pointing upward.  Rub patch adhesive wings for 2 minutes. Remove white label marked "1". Remove the white  label marked "2". Rub patch adhesive wings for 2 additional minutes.  While looking in a mirror, press and release button in center of patch. A small green light will  flash 3-4 times. This will be your only indicator that the monitor has been turned on.  Do not shower for the first 24 hours. You may shower after the first 24 hours.  Press the button if you feel a symptom. You will hear a small click. Record Date, Time and  Symptom in the Patient Logbook.  When you are  ready to remove the patch, follow instructions on the last 2 pages of Patient  Logbook. Stick patch monitor onto the last page of Patient Logbook.  Place Patient Logbook in the blue and white box. Use locking tab on box and tape box closed  securely. The blue and white box has prepaid postage on it. Please place it in the mailbox as  soon as possible. Your physician should have your test results approximately 7 days after the  monitor  has been mailed back to [facility].  Call [place] at [phone] if you have questions regarding  your ZIO XT patch monitor. Call them immediately if you see an orange light blinking on your  monitor.  If your monitor falls off in less than 4 days, contact our Monitor department at [phone].  If your monitor becomes loose or falls off after 4 days call [employer] at [phone] for  suggestions on securing your monitor   Follow-Up: At [facility], you and your health needs are our priority.  As part of our continuing mission to provide you with exceptional heart care, we have created designated Provider Care Teams.  These Care Teams include your primary Cardiologist (physician) and Advanced Practice Providers (APPs -  Physician Assistants and Nurse Practitioners) who all work together to provide you with the care you need, when you need it.  We recommend signing up for the patient portal called "MyChart".  Sign up information is provided on this After Visit Summary.  MyChart is used to connect with patients for Virtual Visits (Telemedicine).  Patients are able to view lab/test results, encounter notes, upcoming appointments, etc.  Non-urgent messages can be sent to your provider as well.   To learn more about what you can do with MyChart, go to [email or web address].    Your next appointment:   3 month(s)  The format for your next appointment:   In Person  Provider:   Quay Burow, MD {

## 2021-09-27 NOTE — Assessment & Plan Note (Signed)
History of PAF postop on amiodarone briefly which was discontinued when I saw her last.  She remains on warfarin anticoagulation.  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  I am going to get a 2-week Zio patch and maintain her on warfarin for the time being.

## 2021-09-27 NOTE — Assessment & Plan Note (Signed)
History of essential hypertension a blood pressure measured today at 140/78.  She is on metoprolol.

## 2021-09-27 NOTE — Progress Notes (Signed)
09/27/2021 Isaac Laud   09/25/1946  [id number]  Primary Physician Quay Burow, Claudina Lick, MD Primary Cardiologist: Lorretta Harp MD Garret Reddish, [place], [place]  HPI:  [name] [name] is a 75 y.o.  Ms Scheid is a  65 -year-old married Caucasian female mother of 3 children, grandmother to 2 grandchildren, patient Dr. Billey Gosling who saw Dr. Sallyanne Kuster remotely.  She is accompanied by her husband [name] today.  She was referred for atypical chest pain.  I last saw her in the office 06/19/2021.Marland Kitchen  Her cardiac risk factor profile is notable for treated hypertension and mild hyperlipidemia. She has never had a heart attack or stroke. She is otherwise healthy except for GERD. She doesn't saw Dr. Sallyanne Kuster back in 2011 for atypical chest pain and workup was negative including a Myoview stress test. She was recently out of the country for several months and returned one month ago. Since that time she's had daily chest pain. She was under a lot of stress and she was away the pain itself sounds like GERD, begin subxiphoid and has no other characteristic symptoms of angina.   Since I saw her 2 years ago she is complained of increasing dyspnea on exertion.  She did have a negative Myoview stress test.  Recent 2D echo revealed severe MR.     She underwent right and left heart cath by myself 03/14/2021 revealing normal coronary arteries with a high V wave.  She also underwent TEE at the same time revealing normal LV function with a flail posterior leaflet and severe MR.  She saw Dr. Roxy Manns at my request and underwent minimally invasive mitral valve repair on 05/21/2021 with a 38 mm Medtronic SIMUFORM  ring as well as left atrial appendage clipping.  She was discharged home on 05/26/2021.  She is recuperated nicely.  She was seen for postop visit at Dr. Guy Sandifer  office and was complaining of some "fatigue".  Since I saw her 3 months ago she did have an occipital stroke with some diplopia.  Her breathing has  significantly improved compared to her symptoms preoperatively.  Her 2D echocardiogram performed 07/03/2021 showed a decline in her EF from 50 to 55% down to 40 to 45% although her MR has resolved.     Current Meds  Medication Sig   acetaminophen (TYLENOL) 500 MG tablet Take 500 mg by mouth every 4 (four) hours as needed for mild pain.   aspirin EC 81 MG EC tablet Take 1 tablet (81 mg total) by mouth daily. Swallow whole.   atorvastatin (LIPITOR) 40 MG tablet Take 40 mg by mouth daily.   gabapentin (NEURONTIN) 100 MG capsule Take 1 capsule in morning and 2 capsules at bedtime (Patient taking differently: Patient is taking 200 mg at night)   warfarin (COUMADIN) 2 MG tablet Take 1-2 tablets Daily or as prescribed by Clinic     No Known Allergies  Social History   Socioeconomic History   Marital status: Married    Spouse name: [name]   Number of children: 3   Years of education: Not on file   Highest education level: Master's degree (e.g., MA, MS, MEng, MEd, MSW, MBA)  Occupational History   Occupation: retired  Tobacco Use   Smoking status: Never   Smokeless tobacco: Never  Vaping Use   Vaping Use: Never used  Substance and Sexual Activity   Alcohol use: No   Drug use: No   Sexual activity: Not on file  Other Topics  Concern   Not on file  Social History Narrative   Married, lives with spouse. Retired Licensed conveyancer, Print production planner. Has 3 kids (moved to [place] to be closer)- youngest son MD. Dorie Rank to [place] from [place] 06/2010, lived in [place] x 10years   Social Determinants of Health   Financial Resource Strain: Not on file  Food Insecurity: Not on file  Transportation Needs: Not on file  Physical Activity: Not on file  Stress: Not on file  Social Connections: Not on file  Intimate Partner Violence: Not on file     Review of Systems: General: negative for chills, fever, night sweats or weight changes.  Cardiovascular: negative for chest pain, dyspnea on exertion, edema,  orthopnea, palpitations, paroxysmal nocturnal dyspnea or shortness of breath Dermatological: negative for rash Respiratory: negative for cough or wheezing Urologic: negative for hematuria Abdominal: negative for nausea, vomiting, diarrhea, bright red blood per rectum, melena, or hematemesis Neurologic: negative for visual changes, syncope, or dizziness All other systems reviewed and are otherwise negative except as noted above.    Blood pressure 140/78, pulse (!) 42, height 5\' 3"  (1.6 m), weight 131 lb 9.6 oz (59.7 kg), SpO2 98 %.  General appearance: alert and no distress Neck: no adenopathy, no carotid bruit, no JVD, supple, symmetrical, trachea midline, and thyroid not enlarged, symmetric, no tenderness/mass/nodules Lungs: clear to auscultation bilaterally Heart: regular rate and rhythm, S1, S2 normal, no murmur, click, rub or gallop Extremities: extremities normal, atraumatic, no cyanosis or edema Pulses: 2+ and symmetric Skin: Skin color, texture, turgor normal. No rashes or lesions Neurologic: Grossly normal  EKG normal sinus rhythm at 87 with sinus arrhythmia and occasional PACs.  I personally reviewed this EKG.  ASSESSMENT AND PLAN:   Essential hypertension History of essential hypertension a blood pressure measured today at 140/78.  She is on metoprolol.  S/P minimally-invasive mitral valve repair History of severe mitral regurgitation for which she was symptomatic status post right and left heart cath by myself 03/14/2021 revealing normal coronary arteries with a large V wave.  A transesophageal echo performed at the same time revealed a flail posterior leaflet.  She underwent minimally invasive mitral valve repair by Dr. Roxy Manns on 05/21/2021 with a 38 mm Medtronic SIMUFORM ring as well as left atrial appendage clipping.  Her symptoms have not markedly improved.  Her most recent 2D echo performed 07/03/2021 revealed a decline in her LV function to 40 to 45% compared to 55% prior to  mitral valve repair although she no longer has mitral regurgitation.  I am going to recheck a 2D echo in 6 months.  Paroxysmal atrial fibrillation (HCC) History of PAF postop on amiodarone briefly which was discontinued when I saw her last.  She remains on warfarin anticoagulation.  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  I am going to get a 2-week Zio patch and maintain her on warfarin for the time being.     Lorretta Harp MD FACP,FACC,FAHA, [facility] 09/27/2021 11:03 AM

## 2021-09-30 ENCOUNTER — Ambulatory Visit (HOSPITAL_COMMUNITY): Payer: PPO

## 2021-10-01 ENCOUNTER — Other Ambulatory Visit: Payer: Self-pay

## 2021-10-01 ENCOUNTER — Encounter: Payer: Self-pay | Admitting: Rehabilitative and Restorative Service Providers"

## 2021-10-01 ENCOUNTER — Ambulatory Visit: Payer: PPO | Attending: Neurology | Admitting: Rehabilitative and Restorative Service Providers"

## 2021-10-01 DIAGNOSIS — M542 Cervicalgia: Secondary | ICD-10-CM | POA: Diagnosis not present

## 2021-10-01 DIAGNOSIS — R293 Abnormal posture: Secondary | ICD-10-CM | POA: Diagnosis not present

## 2021-10-01 NOTE — Therapy (Signed)
[place], [place], [address] Phone: [phone]   Fax:  [phone]  Physical Therapy Evaluation  Patient Details  Name: [name] [name] MRN: [id number] Date of Birth: [birth year] Referring Provider (PT): Dr. Metta Clines, DO   Encounter Date: 10/01/2021   PT End of Session - 10/01/21 1027     Visit Number 1    Number of Visits 8    Date for PT Re-Evaluation 11/19/21    Authorization Type [employer]    Progress Note Due on Visit 10    PT Start Time [phone]    PT Stop Time 1018    PT Time Calculation (min) 41 min    Activity Tolerance Patient tolerated treatment well;No increased pain    Behavior During Therapy [facility] for tasks assessed/performed             Past Medical History:  Diagnosis Date   Arthritis    Atypical chest pain    Diverticulosis    Gastritis 11/08/2007, 04/12/2012   H pylori bx neg 03/2012   GERD (gastroesophageal reflux disease) 11/08/2007, 04/12/2012   Hepatic cyst    Hiatal hernia 11/08/2007, 04/12/2012   History of kidney stones    Hyperlipidemia    Hypertension    IBS (irritable bowel syndrome)    diarrhea predom   Migraine    Mitral regurgitation    Osteoporosis    DEXA 01/21/2012: -2.1 spine and R fem, -1.8 L fem s/p fosamax  2004-2011 DEXA 04/18/14 @ [facility]: -2.5 - rec to start Prolia     PVC's (premature ventricular contractions)    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   Complex valvuloplasty including PTFE neochord placement x6 with 30 mm Medtronic Simuform ring annuloplasty with clipping of LA appendage   Schatzki's ring 11/11/2010   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   Shingles outbreak 04/2013    Past Surgical History:  Procedure Laterality Date   [place] STUDY  03/14/2021   Procedure: BUBBLE STUDY;  Surgeon: Sueanne Margarita, MD;  Location: [place];  Service: Cardiovascular;;   CARDIAC CATHETERIZATION      CLIPPING OF ATRIAL APPENDAGE N/A 05/21/2021   Procedure: CLIPPING OF ATRIAL APPENDAGE USING ATRICURE 40MM PRO2 CLIP;  Surgeon: Rexene Alberts, MD;  Location: [place];  Service: Open Heart Surgery;  Laterality: N/A;   COLONOSCOPY     CYSTOCELE REPAIR  2008   ESOPHAGOGASTRODUODENOSCOPY     Maxilofacial  1992   MITRAL VALVE REPAIR  05/21/2021   Procedure: MINIMALLY INVASIVE MITRAL VALVE REPAIR (MVR) USING MEDTRONIC SIMUFORM 30MM RING;  Surgeon: Rexene Alberts, MD;  Location: [place];  Service: Open Heart Surgery;;   RIGHT/LEFT HEART CATH AND CORONARY ANGIOGRAPHY N/A 03/14/2021   Procedure: RIGHT/LEFT HEART CATH AND CORONARY ANGIOGRAPHY;  Surgeon: Lorretta Harp, MD;  Location: [place] CV LAB;  Service: Cardiovascular;  Laterality: N/A;   TEE WITHOUT CARDIOVERSION N/A 03/14/2021   Procedure: TRANSESOPHAGEAL ECHOCARDIOGRAM (TEE);  Surgeon: Sueanne Margarita, MD;  [facility]: [facility] ENDOSCOPY;  Service: Cardiovascular;  Laterality: N/A;   TEE WITHOUT CARDIOVERSION N/A 05/21/2021   Procedure: TRANSESOPHAGEAL ECHOCARDIOGRAM (TEE);  Surgeon: Rexene Alberts, MD;  Location: [place];  Service: Open Heart Surgery;  Laterality: N/A;   TONSILLECTOMY  1960    There were no vitals filed for this visit.    Subjective Assessment - 10/01/21 0941     Subjective I have been having a strong pain in the back  of my head day and night. I went to a neurologist and he said because I have arthritis it is affecting the rest of the head. Pt reports having a small stroke which affected her vision but that was after she saw the neurologist. Had heart surgery in June.    Pertinent History osteoporosis, severe disc loss C5-6, bil shoulder pain    Limitations Reading;House hold activities;Other (comment)   driving   How long can you sit comfortably? indeterminate amount of time    How long can you stand comfortably? indeterminate amount of time    How long can you walk comfortably? indeterminate amount of time    Diagnostic  tests Xray 06/05/21; no fracture; severe disc height loss C5-6. moderate disc height loss C6-7; degen changes most pronounced C5-6.    Patient Stated Goals to have less pain    Currently in Pain? Yes    Pain Score 8     Pain Location Thoracic    Pain Orientation Right;Left;Posterior    Pain Descriptors / Indicators Burning    Pain Type Chronic pain    Pain Radiating Towards from bil occipital region to bil upper trap    Pain Onset More than a month ago    Pain Frequency Constant    Aggravating Factors  cervical rotation    Pain Relieving Factors Tylenol 1-2x/day    Effect of Pain on Daily Activities does not hinder mobility but pt is aware of it daily    Multiple Pain Sites No                [facility] PT Assessment - 10/01/21 0001       Assessment   Medical Diagnosis neck pain    Referring Provider (PT) Dr. Metta Clines, DO    Onset Date/Surgical Date --   over a year ago   Hand Dominance Right    Next MD Visit 3 months    Prior Therapy none      Precautions   Precautions None      Restrictions   Weight Bearing Restrictions No      Balance Screen   Has the patient fallen in the past 6 months No      [place] residence      Prior Function   Level of Independence Independent      Cognition   Overall Cognitive Status Within Functional Limits for tasks assessed      Observation/Other Assessments   Focus on Therapeutic Outcomes (FOTO)  50% function      Sensation   Light Touch Appears Intact      Coordination   Gross Motor Movements are Fluid and Coordinated Yes      Posture/Postural Control   Posture Comments R scapula lower; bil upper trap tightness R > L seated and supine L > R, bil rounded shoulders, slight forward head      ROM / Strength   AROM / PROM / Strength AROM;PROM;Strength      AROM   Overall AROM Comments cervical AROM flex WFL but with pain in occipital region 30 degrees, ext 48, right cervical rot 45 and L  43, sidebending R 21 and L 21      PROM   Overall PROM Comments cervical PROM WNL all planes with pt difficulty relaxing      Strength   Overall Strength Comments R UE strength 3+/5 and L 4+/5      Palpation   Palpation  comment pt with tightness bil  Upper trap, R scalene; spinal mobility good                        Objective measurements completed on examination: See above findings.                     PT Long Term Goals - 10/01/21 1033       PT LONG TERM GOAL #1   Title Pt will be indep with HEP to assist with cervical pain management    Baseline initiated at eval    Time 7    Period Weeks    Status New    Target Date 11/19/21      PT LONG TERM GOAL #2   Title Pt will be able to rotate cervical spine both directions with decreased head/upper trap pain </=4/10    Baseline up to 8/10    Time 7    Period Weeks    Status New    Target Date 11/19/21      PT LONG TERM GOAL #3   Title Pt will be able to read x 10 min with 75% less difficulty    Baseline unable without pain    Time 7    Period Weeks    Status New    Target Date 11/19/21      PT LONG TERM GOAL #4   Title pt will demo improved foto score to 59% function    Baseline 50%    Time 7    Period Weeks    Status New    Target Date 11/19/21                    Plan - 10/01/21 1012     Clinical Impression Statement Pt presents to PT with complaints of bil posterior head pain extending to bil Upper Trap. She reports pain has been present for years. Xray reports C5-7 disc height impairment. Pt would benefit from further PT to address bil cervical muscle tightness and pain along with posture. Pt denies tinnitus and blurred vision.    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 and mitral valve repair heart surgery 05/21/21     Examination-Activity Limitations Lift;Bed Mobility;Locomotion Level;Reach Overhead;Other   reading, driving   Examination-Participation Restrictions Cleaning;Community Activity;Driving    Stability/Clinical Decision Making Stable/Uncomplicated    Clinical Decision Making Low    Rehab Potential Good    PT Frequency 1x / week    PT Duration --   7 weeks   PT Treatment/Interventions ADLs/Self Care Home Management;Cryotherapy;Ultrasound;Moist Heat;Therapeutic exercise;Therapeutic activities;Functional mobility training;Patient/family education;Manual techniques;Passive range of motion;Dry needling;Taping    PT Next Visit Plan review HEP, begin chin tuck exercises    PT Home Exercise Plan [email or web address]    Consulted and Agree with Plan of Care Patient             Patient will benefit from skilled therapeutic intervention in order to improve the following deficits and impairments:  Decreased endurance, Decreased mobility, Increased muscle spasms, Decreased range of motion, Decreased activity tolerance, Postural dysfunction, Pain  Visit Diagnosis: Cervicalgia  Abnormal posture     Problem List Patient Active Problem List   Diagnosis Date Noted   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, right, ischemic - diplopia 08/18/2021   Binocular vision disorder with diplopia 07/29/2021  Paroxysmal atrial fibrillation ([place]) 06/19/2021   Long term (current) use of anticoagulants 05/29/2021   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   Mitral regurgitation 02/28/2021   Heart failure ([place]) 02/21/2021   Murmur 02/21/2021   Abnormal CT scan of lung 02/06/2021   Chest pain 02/05/2021   Cardiomegaly 02/05/2021   Left lower quadrant abdominal pain 05/01/2020   Acute post-traumatic headache, not intractable 11/15/2019   Prediabetes 05/02/2019   Carbuncle of groin 02/25/2019   Skin abnormalities 05/07/2018   Bilateral shoulder pain 12/11/2017   Gastritis 12/11/2017   Trigger finger, right little finger 09/11/2017    Numbness and tingling in left hand 07/20/2017   Dupuytren's contracture of hand 07/20/2017   CTS (carpal tunnel syndrome) 07/17/2017   Bilateral carpal tunnel syndrome 07/17/2017   Arthralgia 06/30/2017   Leg cramps 06/10/2017   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   Chronic midline low back pain without sciatica 09/15/2016   Osteopenia 07/20/2016   Bilateral finger numbness 06/03/2016   Joint pain 06/03/2016   Hyperlipidemia 02/09/2015   Bilateral primary osteoarthritis of knee 10/03/2013   Lateral meniscal tear 10/03/2013   Patellofemoral pain syndrome 10/03/2013   Varicose vein of leg    Allergic rhinitis    IBS (irritable bowel syndrome) 04/07/2011   Epigastric pain 12/26/2010   Diarrhea 12/25/2010   Migraine headache 09/27/2010   Essential hypertension 09/27/2010   GERD 09/27/2010   RENAL CALCULUS, HX OF 09/27/2010    America Brown, PT 10/01/2021, 10:38 AM  [facility] [address] [place], [place], [address] Phone: [phone]   Fax:  [phone]  Name: [name] [name] MRN: [id number] Date of Birth: 1946/03/15

## 2021-10-01 NOTE — Patient Instructions (Addendum)
Access Code: [email or web address] URL: [email or web address] Date: 10/01/2021 Prepared by: America Brown  Exercises Gentle Levator Scapulae Stretch - 2 x daily - 7 x weekly - 1 sets - 3 reps - 30 sec hold Seated Scapular Retraction - 2 x daily - 7 x weekly - 1 sets - 10 reps  Discussed with pt benefit of heating x 15 min in morning upon awakening, mid day and at night and to not do exercises if they cause pain.

## 2021-10-02 ENCOUNTER — Ambulatory Visit (HOSPITAL_COMMUNITY): Payer: PPO

## 2021-10-02 DIAGNOSIS — I10 Essential (primary) hypertension: Secondary | ICD-10-CM

## 2021-10-02 DIAGNOSIS — I480 Paroxysmal atrial fibrillation: Secondary | ICD-10-CM | POA: Diagnosis not present

## 2021-10-04 ENCOUNTER — Ambulatory Visit (HOSPITAL_COMMUNITY): Payer: PPO

## 2021-10-07 ENCOUNTER — Ambulatory Visit (HOSPITAL_COMMUNITY): Payer: PPO

## 2021-10-08 ENCOUNTER — Telehealth: Payer: Self-pay | Admitting: Pharmacist

## 2021-10-08 DIAGNOSIS — E782 Mixed hyperlipidemia: Secondary | ICD-10-CM

## 2021-10-08 MED ORDER — ATORVASTATIN CALCIUM 40 MG PO TABS: 40.0000 mg | ORAL_TABLET | Freq: Every day | ORAL | 0 refills | Status: DC

## 2021-10-08 NOTE — Telephone Encounter (Signed)
[facility] is requesting refill for atorvastatin 40 mg. Refill ordered.

## 2021-10-09 ENCOUNTER — Ambulatory Visit (HOSPITAL_COMMUNITY): Payer: PPO

## 2021-10-11 ENCOUNTER — Ambulatory Visit: Payer: PPO | Admitting: Physical Therapy

## 2021-10-11 ENCOUNTER — Encounter: Payer: Self-pay | Admitting: Physical Therapy

## 2021-10-11 ENCOUNTER — Ambulatory Visit (HOSPITAL_COMMUNITY): Payer: PPO

## 2021-10-11 ENCOUNTER — Other Ambulatory Visit: Payer: Self-pay

## 2021-10-11 DIAGNOSIS — M542 Cervicalgia: Secondary | ICD-10-CM | POA: Diagnosis not present

## 2021-10-11 DIAGNOSIS — R293 Abnormal posture: Secondary | ICD-10-CM

## 2021-10-11 NOTE — Therapy (Signed)
[address] [place], [place], [address] Phone: [phone]   Fax:  [phone]  Physical Therapy Treatment  Patient Details  Name: [name] [name] MRN: [id number] Date of Birth: August 06, 1946 Referring Provider (PT): Dr. Metta Clines, DO   Encounter Date: 10/11/2021   PT End of Session - 10/11/21 0954     Visit Number 2    Number of Visits 8    Date for PT Re-Evaluation 11/19/21    Authorization Type [employer]    Progress Note Due on Visit 10    PT Start Time 0930    PT Stop Time 1013    PT Time Calculation (min) 43 min             Past Medical History:  Diagnosis Date   Arthritis    Atypical chest pain    Diverticulosis    Gastritis 11/08/2007, 04/12/2012   H pylori bx neg 03/2012   GERD (gastroesophageal reflux disease) 11/08/2007, 04/12/2012   Hepatic cyst    Hiatal hernia 11/08/2007, 04/12/2012   History of kidney stones    Hyperlipidemia    Hypertension    IBS (irritable bowel syndrome)    diarrhea predom   Migraine    Mitral regurgitation    Osteoporosis    DEXA 01/21/2012: -2.1 spine and R fem, -1.8 L fem s/p fosamax  2004-2011 DEXA 04/18/14 @ [facility]: -2.5 - rec to start Prolia     PVC's (premature ventricular contractions)    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   Complex valvuloplasty including PTFE neochord placement x6 with 30 mm Medtronic Simuform ring annuloplasty with clipping of LA appendage   Schatzki's ring 11/11/2010   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   Shingles outbreak 04/2013    Past Surgical History:  Procedure Laterality Date   [place] STUDY  03/14/2021   Procedure: BUBBLE STUDY;  Surgeon: Sueanne Margarita, MD;  Location: [place];  Service: Cardiovascular;;   CARDIAC CATHETERIZATION     CLIPPING OF ATRIAL APPENDAGE N/A 05/21/2021   Procedure: CLIPPING OF ATRIAL APPENDAGE USING ATRICURE 40MM PRO2 CLIP;  Surgeon: Rexene Alberts, MD;  Location: [place];  Service: Open Heart Surgery;  Laterality: N/A;   COLONOSCOPY     CYSTOCELE REPAIR  2008   ESOPHAGOGASTRODUODENOSCOPY     Maxilofacial  1992   MITRAL VALVE REPAIR  05/21/2021   Procedure: MINIMALLY INVASIVE MITRAL VALVE REPAIR (MVR) USING MEDTRONIC SIMUFORM 30MM RING;  Surgeon: Rexene Alberts, MD;  Location: [place];  Service: Open Heart Surgery;;   RIGHT/LEFT HEART CATH AND CORONARY ANGIOGRAPHY N/A 03/14/2021   Procedure: RIGHT/LEFT HEART CATH AND CORONARY ANGIOGRAPHY;  Surgeon: Lorretta Harp, MD;  Location: [place] CV LAB;  Service: Cardiovascular;  Laterality: N/A;   TEE WITHOUT CARDIOVERSION N/A 03/14/2021   Procedure: TRANSESOPHAGEAL ECHOCARDIOGRAM (TEE);  Surgeon: Sueanne Margarita, MD;  Location: [facility] ENDOSCOPY;  Service: Cardiovascular;  Laterality: N/A;   TEE WITHOUT CARDIOVERSION N/A 05/21/2021   Procedure: TRANSESOPHAGEAL ECHOCARDIOGRAM (TEE);  Surgeon: Rexene Alberts, MD;  Location: [place];  Service: Open Heart Surgery;  Laterality: N/A;   TONSILLECTOMY  1960    There were no vitals filed for this visit.   Subjective Assessment - 10/11/21 0934     Subjective Stretches helped, pain is a little better. 6-7/10 today.    Pertinent History osteoporosis, severe disc loss C5-6, bil shoulder pain    Currently in Pain? Yes  Pain Score 6     Pain Location Neck    Pain Orientation Right;Left;Posterior    Pain Descriptors / Indicators Heaviness    Pain Type Chronic pain    Aggravating Factors  cervical rotation    Pain Relieving Factors tylenol, stretches                [facility] PT Assessment - 10/11/21 0001       AROM   Overall AROM Comments cervical rotation right 48, left 38                           [facility] Adult PT Treatment/Exercise - 10/11/21 0001       Self-Care   Self-Care Posture    Posture Performed upright sitting posture with cues for proper alignment and support. Discussed benefits of upright posture       Neck Exercises: Machines for Strengthening   UBE (Upper Arm Bike) L1 x 2 minutes each way      Neck Exercises: Seated   Neck Retraction 10 reps;5 secs    Postural Training scap retract x 10    Other Seated Exercise red band row x 10  red      Neck Exercises: Supine   Neck Retraction 10 reps;5 secs    Neck Retraction Limitations 1 pillow      Neck Exercises: Stretches   Upper Trapezius Stretch 2 reps;30 seconds    Levator Stretch 2 reps;30 seconds                     PT Education - 10/11/21 1003     Education Details HEP, TPDN handout    Person(s) Educated Patient    Methods Explanation;Handout    Comprehension Verbalized understanding                 PT Long Term Goals - 10/01/21 1033       PT LONG TERM GOAL #1   Title Pt will be indep with HEP to assist with cervical pain management    Baseline initiated at eval    Time 7    Period Weeks    Status New    Target Date 11/19/21      PT LONG TERM GOAL #2   Title Pt will be able to rotate cervical spine both directions with decreased head/upper trap pain </=4/10    Baseline up to 8/10    Time 7    Period Weeks    Status New    Target Date 11/19/21      PT LONG TERM GOAL #3   Title Pt will be able to read x 10 min with 75% less difficulty    Baseline unable without pain    Time 7    Period Weeks    Status New    Target Date 11/19/21      PT LONG TERM GOAL #4   Title pt will demo improved foto score to 59% function    Baseline 50%    Time 7    Period Weeks    Status New    Target Date 11/19/21                   Plan - 10/11/21 0955     Clinical Impression Statement Pt reports improvement since initial EVAL with lower pain level. Her AROM cervical rotation has improved to the right with more stiffness to the left. Discussed TPDN  with patient and she is agreeable to reviewing information handout for possible treatment next session. REviewed stretches and progressed with chin  tuck in supine and seated . Pt demonstrates good exercise technique and these were assigned for HEP.    PT Treatment/Interventions ADLs/Self Care Home Management;Cryotherapy;Ultrasound;Moist Heat;Therapeutic exercise;Therapeutic activities;Functional mobility training;Patient/family education;Manual techniques;Passive range of motion;Dry needling;Taping    PT Next Visit Plan review HEP, begin chin tuck exercises; possible TPDN next session    PT Home Exercise Plan [email or web address]    Consulted and Agree with Plan of Care Patient             Patient will benefit from skilled therapeutic intervention in order to improve the following deficits and impairments:  Decreased endurance, Decreased mobility, Increased muscle spasms, Decreased range of motion, Decreased activity tolerance, Postural dysfunction, Pain  Visit Diagnosis: Cervicalgia  Abnormal posture     Problem List Patient Active Problem List   Diagnosis Date Noted   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, right, ischemic - diplopia 08/18/2021   Binocular vision disorder with diplopia 07/29/2021   Paroxysmal atrial fibrillation ([place]) 06/19/2021   Long term (current) use of anticoagulants 05/29/2021   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   Mitral regurgitation 02/28/2021   Heart failure ([place]) 02/21/2021   Murmur 02/21/2021   Abnormal CT scan of lung 02/06/2021   Chest pain 02/05/2021   Cardiomegaly 02/05/2021   Left lower quadrant abdominal pain 05/01/2020   Acute post-traumatic headache, not intractable 11/15/2019   Prediabetes 05/02/2019   Carbuncle of groin 02/25/2019   Skin abnormalities 05/07/2018   Bilateral shoulder pain 12/11/2017   Gastritis 12/11/2017   Trigger finger, right little finger 09/11/2017   Numbness and tingling in left hand 07/20/2017   Dupuytren's contracture of hand 07/20/2017   CTS (carpal tunnel syndrome) 07/17/2017   Bilateral carpal tunnel syndrome 07/17/2017   Arthralgia 06/30/2017   Leg cramps  06/10/2017   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   Chronic midline low back pain without sciatica 09/15/2016   Osteopenia 07/20/2016   Bilateral finger numbness 06/03/2016   Joint pain 06/03/2016   Hyperlipidemia 02/09/2015   Bilateral primary osteoarthritis of knee 10/03/2013   Lateral meniscal tear 10/03/2013   Patellofemoral pain syndrome 10/03/2013   Varicose vein of leg    Allergic rhinitis    IBS (irritable bowel syndrome) 04/07/2011   Epigastric pain 12/26/2010   Diarrhea 12/25/2010   Migraine headache 09/27/2010   Essential hypertension 09/27/2010   GERD 09/27/2010   RENAL CALCULUS, HX OF 09/27/2010    Dorene Ar, PTA 10/11/2021, 10:23 AM  [facility] [address] [place], [place], [address] Phone: [phone]   Fax:  [phone]  Name: SHONI QUIJAS MRN: [id number] Date of Birth: 1946-01-30

## 2021-10-11 NOTE — Patient Instructions (Addendum)
  Trigger Point Dry Needling  What is Trigger Point Dry Needling (DN)? DN is a physical therapy technique used to treat muscle pain and dysfunction. Specifically, DN helps deactivate muscle trigger points (muscle knots).  A thin filiform needle is used to penetrate the skin and stimulate the underlying trigger point. The goal is for a local twitch response (LTR) to occur and for the trigger point to relax. No medication of any kind is injected during the procedure.   What Does Trigger Point Dry Needling Feel Like?  The procedure feels different for each individual patient. Some patients report that they do not actually feel the needle enter the skin and overall the process is not painful. Very mild bleeding may occur. However, many patients feel a deep cramping in the muscle in which the needle was inserted. This is the local twitch response.   How Will I feel after the treatment? Soreness is normal, and the onset of soreness may not occur for a few hours. Typically this soreness does not last longer than two days.  Bruising is uncommon, however; ice can be used to decrease any possible bruising.  In rare cases feeling tired or nauseous after the treatment is normal. In addition, your symptoms may get worse before they get better, this period will typically not last longer than 24 hours.   What Can I do After My Treatment? Increase your hydration by drinking more water for the next 24 hours. You may place ice or heat on the areas treated that have become sore, however, do not use heat on inflamed or bruised areas. Heat often brings more relief post needling. You can continue your regular activities, but vigorous activity is not recommended initially after the treatment for 24 hours. DN is best combined with other physical therapy such as strengthening, stretching, and other therapies.    Access Code: [email or web address] URL: [email or web address] Date: 10/11/2021 Prepared by: Hessie Diener  Exercises Gentle Levator Scapulae Stretch - 2 x daily - 7 x weekly - 1 sets - 3 reps - 30 sec hold Seated Scapular Retraction - 2 x daily - 7 x weekly - 1 sets - 10 reps Supine Cervical Retraction with Towel - 1 x daily - 7 x weekly - 2 sets - 10 reps - 5 hold Standing Cervical Retraction - 1 x daily - 7 x weekly - 1 sets - 10 reps - 5 hold

## 2021-10-15 ENCOUNTER — Ambulatory Visit: Payer: PPO | Admitting: Physical Therapy

## 2021-10-15 ENCOUNTER — Encounter: Payer: Self-pay | Admitting: Physical Therapy

## 2021-10-15 ENCOUNTER — Other Ambulatory Visit: Payer: Self-pay

## 2021-10-15 DIAGNOSIS — M542 Cervicalgia: Secondary | ICD-10-CM

## 2021-10-15 DIAGNOSIS — R293 Abnormal posture: Secondary | ICD-10-CM

## 2021-10-15 NOTE — Patient Instructions (Signed)
    Voncille Lo, PT, [place] Certified Exercise Expert for the Aging Adult  10/15/21 8:15 AM Phone: [phone] Fax: [phone]

## 2021-10-15 NOTE — Therapy (Signed)
[place], [place], [address] Phone: [phone]   Fax:  [phone]  Physical Therapy Treatment  Patient Details  Name: [name] [name] MRN: [id number] Date of Birth: [birth year] Referring Provider (PT): Dr. Metta Clines, DO   Encounter Date: 10/15/2021   PT End of Session - 10/15/21 0830     Visit Number 3    Number of Visits 8    Date for PT Re-Evaluation 11/19/21    Authorization Type [employer]    PT Start Time 0800    PT Stop Time 0347    PT Time Calculation (min) 44 min    Activity Tolerance Patient tolerated treatment well;No increased pain    Behavior During Therapy [facility] for tasks assessed/performed             Past Medical History:  Diagnosis Date   Arthritis    Atypical chest pain    Diverticulosis    Gastritis 11/08/2007, 04/12/2012   H pylori bx neg 03/2012   GERD (gastroesophageal reflux disease) 11/08/2007, 04/12/2012   Hepatic cyst    Hiatal hernia 11/08/2007, 04/12/2012   History of kidney stones    Hyperlipidemia    Hypertension    IBS (irritable bowel syndrome)    diarrhea predom   Migraine    Mitral regurgitation    Osteoporosis    DEXA 01/21/2012: -2.1 spine and R fem, -1.8 L fem s/p fosamax  2004-2011 DEXA 04/18/14 @ [facility]: -2.5 - rec to start Prolia     PVC's (premature ventricular contractions)    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   Complex valvuloplasty including PTFE neochord placement x6 with 30 mm Medtronic Simuform ring annuloplasty with clipping of LA appendage   Schatzki's ring 11/11/2010   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   Shingles outbreak 04/2013    Past Surgical History:  Procedure Laterality Date   [place] STUDY  03/14/2021   Procedure: BUBBLE STUDY;  Surgeon: Sueanne Margarita, MD;  Location: [place];  Service: Cardiovascular;;   CARDIAC CATHETERIZATION     CLIPPING OF ATRIAL APPENDAGE N/A  05/21/2021   Procedure: CLIPPING OF ATRIAL APPENDAGE USING ATRICURE 40MM PRO2 CLIP;  Surgeon: Rexene Alberts, MD;  Location: [place];  Service: Open Heart Surgery;  Laterality: N/A;   COLONOSCOPY     CYSTOCELE REPAIR  2008   ESOPHAGOGASTRODUODENOSCOPY     Maxilofacial  1992   MITRAL VALVE REPAIR  05/21/2021   Procedure: MINIMALLY INVASIVE MITRAL VALVE REPAIR (MVR) USING MEDTRONIC SIMUFORM 30MM RING;  Surgeon: Rexene Alberts, MD;  Location: [place];  Service: Open Heart Surgery;;   RIGHT/LEFT HEART CATH AND CORONARY ANGIOGRAPHY N/A 03/14/2021   Procedure: RIGHT/LEFT HEART CATH AND CORONARY ANGIOGRAPHY;  Surgeon: Lorretta Harp, MD;  Location: [place] [facility];  Service: Cardiovascular;  Laterality: N/A;   TEE WITHOUT CARDIOVERSION N/A 03/14/2021   Procedure: TRANSESOPHAGEAL ECHOCARDIOGRAM (TEE);  Surgeon: Sueanne Margarita, MD;  Location: [facility];  Service: Cardiovascular;  Laterality: N/A;   TEE WITHOUT CARDIOVERSION N/A 05/21/2021   Procedure: TRANSESOPHAGEAL ECHOCARDIOGRAM (TEE);  Surgeon: Rexene Alberts, MD;  Location: [place];  Service: Open Heart Surgery;  Laterality: N/A;   TONSILLECTOMY  1960    There were no vitals filed for this visit.   Subjective Assessment - 10/15/21 0803     Subjective I am not much better,  I did not do the exercises like I was supposed to. I read up  on TPDN and I am going through so much right now that I want to decline for now.    Pertinent History osteoporosis, severe disc loss C5-6, bil shoulder pain    Limitations Reading;House hold activities;Other (comment)    Diagnostic tests Xray 06/05/21; no fracture; severe disc height loss C5-6. moderate disc height loss C6-7; degen changes most pronounced C5-6.    Patient Stated Goals to have less pain    Currently in Pain? Yes    Pain Score 6     Pain Location Neck    Pain Orientation Right;Left;Posterior    Pain Descriptors / Indicators Burning;Heaviness    Pain Type Chronic pain    Pain Onset More  than a month ago    Pain Frequency Constant    Aggravating Factors  cervical rotation when I am driving               [facility] Adult PT Treatment/Exercise:   Therapeutic Exercise:  Gentle Levator Scapulae Stretch  2 reps - 30 sec hold  R and L Seated Scapular Retraction - 2 sets - 10 reps  VC for decreasing UT elevation Supine Cervical Retraction with Towel 2 sets - 10 reps - 5 hold  Standing Cervical Retraction 1 set - 10 reps - 5 hold  Shoulder External Rotation and Scapular Retraction with Resistance 10 reps - 3 sets  3 sec hold Scapular Retraction with Resistance - 10 reps - 3 sets  Scapular Retraction with Resistance Advanced 10 reps - 3 sets  Standing Shoulder Diagonal and horizontal abd ( Star pattern) 2 sets 10 reps  to fatigue  Manual Therapy:  Cervical STW bil with TPR of suboccipitials and bil cervical paraspinals, Suboccipital release Bil UT and Levator TPR  Neuromuscular re-ed:     Therapeutic Activity:     Self Care:    ITEMS NOT PERFORMED TODAY:                   added to HEP Access Code: [email or web address]: [email or web address]: 11/22/2022Prepared by: Donnetta Simpers BeardsleyExercises   Shoulder External Rotation and Scapular Retraction with Resistance - 2 x daily - 7 x weekly - 10 reps - 3 sets  Scapular Retraction with Resistance - 2 x daily - 7 x weekly - 10 reps - 3 sets  Scapular Retraction with Resistance Advanced - 2 x daily - 7 x weekly - 10 reps - 3 sets  Standing Shoulder Diagonal Horizontal Abduction 60/120 Degrees with Resistance - 1 x daily - 7 x weekly - 3 sets - 10 reps       PT Education - 10/15/21 0813     Education Details added to HEP    Person(s) Educated Patient    Methods Explanation;Demonstration;Tactile cues;Verbal cues;Handout    Comprehension Verbalized understanding;Returned demonstration                 PT Long Term Goals - 10/15/21 0820       PT LONG TERM GOAL #1   Title Pt will be  indep with HEP to assist with cervical pain management    Baseline needs reinforcement and needs to be compliant with HEP    Time 7    Period Weeks    Status On-going    Target Date 11/19/21      PT LONG TERM GOAL #2   Title Pt will be able to rotate cervical spine both directions with decreased head/upper trap pain </=4/10    Baseline pt with 6/10 today  Time 7    Period Weeks    Status On-going    Target Date 11/19/21      PT LONG TERM GOAL #3   Title Pt will be able to read x 10 min with 75% less difficulty    Baseline read 30 minutes now    Time 7    Period Weeks    Status Achieved    Target Date 11/19/21      PT LONG TERM GOAL #4   Title pt will demo improved foto score to 59% function    Baseline 50%    Time 7    Period Weeks    Status Unable to assess    Target Date 11/19/21                   Plan - 10/15/21 0835     Clinical Impression Statement Pt enters clinic with 6/10 pain and reports not much difference from last visit. she reports not doing exercises as well as she needed to.  Pt also needed reinforcement of proper form of exercise since she was not executing properly.  Pt declined TPDN this visit.  Pt added to HEP to include subscapular strength and performed under guidance and VC and TC of PT.  Pt also achieved goal of being able to read for 10 minutes.  Reports reading for 30 mintues now. 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, right, ischemic - diplopia 08/18/2021   Binocular vision disorder with diplopia 07/29/2021   Paroxysmal atrial fibrillation ([place]) 06/19/2021   Long term (current) use of anticoagulants 05/29/2021   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   Mitral regurgitation 02/28/2021   Heart failure ([place]) 02/21/2021   Murmur 02/21/2021   Abnormal CT scan of lung 02/06/2021   Chest pain 02/05/2021   Cardiomegaly 02/05/2021   Left lower quadrant abdominal pain 05/01/2020   Acute post-traumatic headache, not intractable 11/15/2019   Prediabetes 05/02/2019   Carbuncle of groin 02/25/2019   Skin abnormalities 05/07/2018   Bilateral shoulder pain 12/11/2017   Gastritis 12/11/2017   Trigger finger, right little finger 09/11/2017   Numbness and tingling in left hand 07/20/2017  Dupuytren's contracture of hand 07/20/2017   CTS (carpal tunnel syndrome) 07/17/2017   Bilateral carpal tunnel syndrome 07/17/2017   Arthralgia 06/30/2017   Leg cramps 06/10/2017    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   Chronic midline low back pain without sciatica 09/15/2016   Osteopenia 07/20/2016   Bilateral finger numbness 06/03/2016   Joint pain 06/03/2016   Hyperlipidemia 02/09/2015   Bilateral primary osteoarthritis of knee 10/03/2013   Lateral meniscal tear 10/03/2013   Patellofemoral pain syndrome 10/03/2013   Varicose vein of leg    Allergic rhinitis    IBS (irritable bowel syndrome) 04/07/2011   Epigastric pain 12/26/2010   Diarrhea 12/25/2010   Migraine headache 09/27/2010   Essential hypertension 09/27/2010   GERD 09/27/2010   RENAL CALCULUS, HX OF 09/27/2010   Voncille Lo, PT, [place] Certified Exercise Expert for the Aging Adult  10/15/21 8:49 AM Phone: [phone] Fax: [facility] [address] [place], [place], [address] Phone: [phone]   Fax:  [phone]  Name: [name] [name] MRN: [id number] Date of Birth: [birth year]

## 2021-10-22 ENCOUNTER — Other Ambulatory Visit: Payer: Self-pay

## 2021-10-22 ENCOUNTER — Ambulatory Visit: Payer: PPO | Admitting: Physical Therapy

## 2021-10-22 DIAGNOSIS — M542 Cervicalgia: Secondary | ICD-10-CM | POA: Diagnosis not present

## 2021-10-22 DIAGNOSIS — R293 Abnormal posture: Secondary | ICD-10-CM

## 2021-10-22 NOTE — Therapy (Signed)
[place], [place], [address] Phone: [phone]   Fax:  [phone]  Physical Therapy Treatment/discharge note  Patient Details  Name: [name] [name] MRN: [id number] Date of Birth: 1946-05-05 Referring Provider (PT): Metta Clines DO   Encounter Date: 10/22/2021   PT End of Session - 10/22/21 1138     Visit Number 4    Number of Visits 8    Date for PT Re-Evaluation 11/19/21    Authorization Type [employer]    PT Start Time 0932    PT Stop Time 1016    PT Time Calculation (min) 44 min    Activity Tolerance Patient tolerated treatment well;No increased pain    Behavior During Therapy [facility] for tasks assessed/performed             Past Medical History:  Diagnosis Date   Arthritis    Atypical chest pain    Diverticulosis    Gastritis 11/08/2007, 04/12/2012   H pylori bx neg 03/2012   GERD (gastroesophageal reflux disease) 11/08/2007, 04/12/2012   Hepatic cyst    Hiatal hernia 11/08/2007, 04/12/2012   History of kidney stones    Hyperlipidemia    Hypertension    IBS (irritable bowel syndrome)    diarrhea predom   Migraine    Mitral regurgitation    Osteoporosis    DEXA 01/21/2012: -2.1 spine and R fem, -1.8 L fem s/p fosamax  2004-2011 DEXA 04/18/14 @ [facility]: -2.5 - rec to start Prolia     PVC's (premature ventricular contractions)    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   Complex valvuloplasty including PTFE neochord placement x6 with 30 mm Medtronic Simuform ring annuloplasty with clipping of LA appendage   Schatzki's ring 11/11/2010   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   Shingles outbreak 04/2013    Past Surgical History:  Procedure Laterality Date   [place] STUDY  03/14/2021   Procedure: BUBBLE STUDY;  Surgeon: Sueanne Margarita, MD;  Location: [place];  Service: Cardiovascular;;   CARDIAC CATHETERIZATION     CLIPPING OF ATRIAL APPENDAGE  N/A 05/21/2021   Procedure: CLIPPING OF ATRIAL APPENDAGE USING ATRICURE 40MM PRO2 CLIP;  Surgeon: Rexene Alberts, MD;  Location: [place];  Service: Open Heart Surgery;  Laterality: N/A;   COLONOSCOPY     CYSTOCELE REPAIR  2008   ESOPHAGOGASTRODUODENOSCOPY     Maxilofacial  1992   MITRAL VALVE REPAIR  05/21/2021   Procedure: MINIMALLY INVASIVE MITRAL VALVE REPAIR (MVR) USING MEDTRONIC SIMUFORM 30MM RING;  Surgeon: Rexene Alberts, MD;  Location: [place];  Service: Open Heart Surgery;;   RIGHT/LEFT HEART CATH AND CORONARY ANGIOGRAPHY N/A 03/14/2021   Procedure: RIGHT/LEFT HEART CATH AND CORONARY ANGIOGRAPHY;  Surgeon: Lorretta Harp, MD;  Location: [place] [facility];  Service: Cardiovascular;  Laterality: N/A;   TEE WITHOUT CARDIOVERSION N/A 03/14/2021   Procedure: TRANSESOPHAGEAL ECHOCARDIOGRAM (TEE);  Surgeon: Sueanne Margarita, MD;  Location: [facility];  Service: Cardiovascular;  Laterality: N/A;   TEE WITHOUT CARDIOVERSION N/A 05/21/2021   Procedure: TRANSESOPHAGEAL ECHOCARDIOGRAM (TEE);  Surgeon: Rexene Alberts, MD;  Location: [place];  Service: Open Heart Surgery;  Laterality: N/A;   TONSILLECTOMY  1960    There were no vitals filed for this visit.   Subjective Assessment - 10/22/21 0945     Pertinent History osteoporosis, severe disc loss C5-6, bil shoulder pain    Limitations Reading;House hold activities;Other (comment)  How long can you sit comfortably? at least 30 - 40 minutes for reading    Diagnostic tests Xray 06/05/21; no fracture; severe disc height loss C5-6. moderate disc height loss C6-7; degen changes most pronounced C5-6.    Patient Stated Goals to have less pain    Currently in Pain? Yes    Pain Score 4     Pain Location Neck    Pain Orientation Right;Left;Posterior    Pain Descriptors / Indicators Pressure    Pain Type Chronic pain    Pain Onset More than a month ago                [facility] PT Assessment - 10/22/21 0001       Assessment   Medical  Diagnosis cervicalgia    Referring Provider (PT) Metta Clines DO      Balance Screen   Has the patient fallen in the past 6 months No    Has the patient had a decrease in activity level because of a fear of falling?  No    Is the patient reluctant to leave their home because of a fear of falling?  No      Observation/Other Assessments   Focus on Therapeutic Outcomes (FOTO)  FOTO eval 50% 65%DC predicted 59%      Posture/Postural Control   Posture/Postural Control Postural limitations    Postural Limitations Rounded Shoulders;Forward head      AROM   Overall AROM Comments cervical rotation right 55, left 40    Cervical Flexion 40    Cervical Extension 53    Cervical - Right Side Bend 23    Cervical - Left Side Bend 22    Cervical - Right Rotation 55    Cervical - Left Rotation 40      Strength   Overall Strength Comments RUE strength 4-/5 to 4+/5 unable to lift 25 # but able to lift 5 lb overhead      Palpation   Palpation comment Pt with tightness with Left cervical rotation, bony block and bil tightness over bil upper trap and cervical paraspinals              Therapeutic Exercise:   Gentle Levator Scapulae Stretch  2 reps - 30 sec hold  R and L Seated Scapular Retraction - 2 sets - 10 reps  VC for decreasing UT elevation Supine Cervical Retraction with Towel 2 sets - 10 reps - 5 hold  Standing Cervical Retraction 1 set - 10 reps - 5 hold  Shoulder External Rotation and Scapular Retraction with Resistance 10 reps - 3 sets  3 sec hold Scapular Retraction with Resistance - 10 reps - 3 sets  Scapular Retraction with Resistance Advanced 10 reps - 3 sets  Standing Shoulder Diagonal and horizontal abd ( Star pattern) 2 sets 10 reps  to fatigue   Manual Therapy:      Neuromuscular re-ed:       Therapeutic Activity:       Self Care:  Myofascial self release with double tennis  to base of neck/occiput with chin tucks, reinforced proper technique with towel cervical  rotation.  Self care with hydration, movement and sleep as building blocks of health   ITEMS NOT PERFORMED TODAY:                       PT Education - 10/22/21 0953     Education Details reviewed HEP  gave two tennis balls for  self myofascial release    Person(s) Educated Patient    Methods Explanation;Demonstration;Tactile cues;Verbal cues;Handout    Comprehension Verbalized understanding;Returned demonstration                 PT Long Term Goals - 10/22/21 0945       PT LONG TERM GOAL #1   Title Pt will be indep with HEP to assist with cervical pain management    Baseline Pt independent with HEP given    Time 7    Period Weeks    Status Achieved      PT LONG TERM GOAL #2   Title Pt will be able to rotate cervical spine both directions with decreased head/upper trap pain </=4/10    Baseline pt with 4/10 after RX session    Time 7    Period Weeks    Status Achieved      PT LONG TERM GOAL #3   Title Pt will be able to read x 10 min with 75% less difficulty    Baseline read 30 minutes now    Time 7    Period Weeks    Status Achieved      PT LONG TERM GOAL #4   Title pt will demo improved foto score to 59% function    Baseline 50%   65% DC    Time 7    Period Weeks    Status Achieved                   Plan - 10/22/21 0949     Clinical Impression Statement Pt enters clinic with 4/10 pain   FOTO improved from 50% to 65% ( predicted 59%)  Pt achieved all goals but cervical rotation to Left is 40 degrees and to the Right is 55.  Pt requests DC because she has many appt and cares for many people and she wants to continue HEP given at home and DC for now.  Pt does have a bony block with Left rotation.  Pt educated on self myofascial release with tennis ball and also using assisted rotation with towel. Pt HEP reinforced during clinic time and pt told to return to MD if she does not continue to improve with pain. 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, right, ischemic - diplopia 08/18/2021  Binocular vision disorder with diplopia 07/29/2021   Paroxysmal atrial fibrillation ([place]) 06/19/2021   Long term (current) use of anticoagulants 05/29/2021   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   Mitral regurgitation 02/28/2021   Heart failure ([place]) 02/21/2021   Murmur 02/21/2021   Abnormal CT scan of lung 02/06/2021   Chest pain 02/05/2021   Cardiomegaly 02/05/2021   Left lower quadrant abdominal pain 05/01/2020   Acute post-traumatic headache, not intractable 11/15/2019   Prediabetes 05/02/2019   Carbuncle of groin 02/25/2019   Skin  abnormalities 05/07/2018   Bilateral shoulder pain 12/11/2017   Gastritis 12/11/2017   Trigger finger, right little finger 09/11/2017   Numbness and tingling in left hand 07/20/2017   Dupuytren's contracture of hand 07/20/2017   CTS (carpal tunnel syndrome) 07/17/2017   Bilateral carpal tunnel syndrome 07/17/2017   Arthralgia 06/30/2017   Leg cramps 06/10/2017   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   Chronic midline low back pain without sciatica 09/15/2016   Osteopenia 07/20/2016   Bilateral finger numbness 06/03/2016   Joint pain 06/03/2016   Hyperlipidemia 02/09/2015   Bilateral primary osteoarthritis of knee 10/03/2013   Lateral meniscal tear 10/03/2013   Patellofemoral pain syndrome 10/03/2013   Varicose vein of leg    Allergic rhinitis    IBS (irritable bowel syndrome) 04/07/2011   Epigastric pain 12/26/2010   Diarrhea 12/25/2010   Migraine headache 09/27/2010   Essential hypertension 09/27/2010   GERD 09/27/2010   RENAL CALCULUS, HX OF 09/27/2010    Dorothea Ogle, PT 10/22/2021, 11:42 AM  [facility] [address] [place], [place], [address] Phone: [phone]   Fax:  [phone]  Name: [name] [name] MRN: [id number] Date of Birth: 1946-04-22  PHYSICAL THERAPY DISCHARGE SUMMARY  Visits from Start of Care: 4  Current functional level related to goals / functional outcomes: As above   Remaining deficits: Diffficulty turning neck to left  bony block   Education / Equipment: HEP   Patient agrees to discharge. Patient goals were met. Patient is being discharged due to the patient's request.   Pt has many appt to attend and people to care for so she wants to discharge and do her HEP at home.  She has done well and completed all goals.  Voncille Lo, PT, [place] Certified Exercise Expert for the Aging Adult  10/22/21 11:42 AM Phone: [phone] Fax: [phone]

## 2021-10-22 NOTE — Patient Instructions (Signed)
Review HEP for session  Access Code: [email or web address]: [email or web address]: 11/29/2022Prepared by: Donnetta Simpers BeardsleyExercises  Gentle Levator Scapulae Stretch - 2 x daily - 7 x weekly - 1 sets - 3 reps - 30 sec hold  Seated Scapular Retraction - 2 x daily - 7 x weekly - 1 sets - 10 reps  Supine Cervical Retraction with Towel - 1 x daily - 7 x weekly - 2 sets - 10 reps - 5 hold  Standing Cervical Retraction - 1 x daily - 7 x weekly - 1 sets - 10 reps - 5 hold  Shoulder External Rotation and Scapular Retraction with Resistance - 2 x daily - 7 x weekly - 3 sets - 10 reps  Scapular Retraction with Resistance - 2 x daily - 7 x weekly - 3 sets - 10 reps  Scapular Retraction with Resistance Advanced - 2 x daily - 7 x weekly - 3 sets - 10 reps  Standing Shoulder Diagonal Horizontal Abduction 60/120 Degrees with Resistance - 1 x daily - 7 x weekly - 3 sets - 10 reps  Seated Assisted Cervical Rotation with Towel - 1 x daily - 7 x weekly - 1 sets - 3 reps - 10-15 sec hold   Voncille Lo, PT, [facility] Certified Exercise Expert for the Aging Adult  10/22/21 9:53 AM Phone: [phone] Fax: [phone]

## 2021-10-22 NOTE — Progress Notes (Deleted)
[place] Phone: [phone] Subjective:    I'm seeing this patient by the request  of:  Binnie Rail, MD  CC:   [id number]  Last seen June 2021 for bilateral knee pain  Updated 10/23/2021 AVICE FUNCHESS is a 75 y.o. female coming in with complaint of ***  Onset-  Location Duration-  Character- Aggravating factors- Reliving factors-  Therapies tried-  Severity-     Past Medical History:  Diagnosis Date   Arthritis    Atypical chest pain    Diverticulosis    Gastritis 11/08/2007, 04/12/2012   H pylori bx neg 03/2012   GERD (gastroesophageal reflux disease) 11/08/2007, 04/12/2012   Hepatic cyst    Hiatal hernia 11/08/2007, 04/12/2012   History of kidney stones    Hyperlipidemia    Hypertension    IBS (irritable bowel syndrome)    diarrhea predom   Migraine    Mitral regurgitation    Osteoporosis    DEXA 01/21/2012: -2.1 spine and R fem, -1.8 L fem s/p fosamax  2004-2011 DEXA 04/18/14 @ [facility]: -2.5 - rec to start Prolia     PVC's (premature ventricular contractions)    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   Complex valvuloplasty including PTFE neochord placement x6 with 30 mm Medtronic Simuform ring annuloplasty with clipping of LA appendage   Schatzki's ring 11/11/2010   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   Shingles outbreak 04/2013   Past Surgical History:  Procedure Laterality Date   [place] STUDY  03/14/2021   Procedure: BUBBLE STUDY;  Surgeon: Sueanne Margarita, MD;  Location: [facility];  Service: Cardiovascular;;   CARDIAC CATHETERIZATION     CLIPPING OF ATRIAL APPENDAGE N/A 05/21/2021   Procedure: CLIPPING OF ATRIAL APPENDAGE USING ATRICURE 40MM PRO2 CLIP;  Surgeon: Rexene Alberts, MD;  Location: [facility];  Service: Open Heart Surgery;  Laterality: N/A;   COLONOSCOPY     CYSTOCELE REPAIR  2008   ESOPHAGOGASTRODUODENOSCOPY     Maxilofacial   1992   MITRAL VALVE REPAIR  05/21/2021   Procedure: MINIMALLY INVASIVE MITRAL VALVE REPAIR (MVR) USING MEDTRONIC SIMUFORM 30MM RING;  Surgeon: Rexene Alberts, MD;  Location: [place];  Service: Open Heart Surgery;;   RIGHT/LEFT HEART CATH AND CORONARY ANGIOGRAPHY N/A 03/14/2021   Procedure: RIGHT/LEFT HEART CATH AND CORONARY ANGIOGRAPHY;  Surgeon: Lorretta Harp, MD;  Location: [facility];  Service: Cardiovascular;  Laterality: N/A;   TEE WITHOUT CARDIOVERSION N/A 03/14/2021   Procedure: TRANSESOPHAGEAL ECHOCARDIOGRAM (TEE);  Surgeon: Sueanne Margarita, MD;  Location: [facility] ENDOSCOPY;  Service: Cardiovascular;  Laterality: N/A;   TEE WITHOUT CARDIOVERSION N/A 05/21/2021   Procedure: TRANSESOPHAGEAL ECHOCARDIOGRAM (TEE);  Surgeon: Rexene Alberts, MD;  Location: [place];  Service: Open Heart Surgery;  Laterality: N/A;   TONSILLECTOMY  1960   Social History   Socioeconomic History   Marital status: Married    Spouse name: [name]   Number of children: 3   Years of education: Not on file   Highest education level: Master's degree (e.g., MA, MS, MEng, MEd, MSW, MBA)  Occupational History   Occupation: retired  Tobacco Use   Smoking status: Never   Smokeless tobacco: Never  Vaping Use   Vaping Use: Never used  Substance and Sexual Activity   Alcohol use: No   Drug use: No   Sexual activity: Not on file  Other Topics Concern  Not on file  Social History Narrative   Married, lives with spouse. Retired Licensed conveyancer, Print production planner. Has 3 kids (moved to [place] to be closer)- youngest son MD. Dorie Rank to [place] from [place] 06/2010, lived in [place] x 10years   Social Determinants of Health   Financial Resource Strain: Not on file  Food Insecurity: Not on file  Transportation Needs: Not on file  Physical Activity: Not on file  Stress: Not on file  Social Connections: Not on file   No Known Allergies Family History  Problem Relation Age of Onset   Hypertension Mother    Alzheimer's  disease Mother 32   AAA (abdominal aortic aneurysm) Mother    Stroke Father 47   Kidney cancer Brother        mets   Bone cancer Brother 19   Colon cancer Neg Hx      Current Outpatient Medications (Cardiovascular):    metoprolol succinate (TOPROL-XL) 25 MG 24 hr tablet, TAKE ONE TABLET BY MOUTH AT BREAKFAST AND AT BEDTIME (Patient not taking: Reported on 10/01/2021)   atorvastatin (LIPITOR) 40 MG tablet, Take 1 tablet (40 mg total) by mouth daily.   Current Outpatient Medications (Analgesics):    acetaminophen (TYLENOL) 500 MG tablet, Take 500 mg by mouth every 4 (four) hours as needed for mild pain.   aspirin EC 81 MG EC tablet, Take 1 tablet (81 mg total) by mouth daily. Swallow whole.  Current Outpatient Medications (Hematological):    warfarin (COUMADIN) 2 MG tablet, Take 1-2 tablets Daily or as prescribed by Clinic  Current Outpatient Medications (Other):    gabapentin (NEURONTIN) 100 MG capsule, Take 1 capsule in morning and 2 capsules at bedtime (Patient taking differently: Patient is taking 200 mg at night)   Reviewed prior external information including notes and imaging from  primary care provider As well as notes that were available from care everywhere and other healthcare systems.  Past medical history, social, surgical and family history all reviewed in electronic medical record.  No pertanent information unless stated regarding to the chief complaint.   Review of Systems:  No headache, visual changes, nausea, vomiting, diarrhea, constipation, dizziness, abdominal pain, skin rash, fevers, chills, night sweats, weight loss, swollen lymph nodes, body aches, joint swelling, chest pain, shortness of breath, mood changes. POSITIVE muscle aches  Objective  There were no vitals taken for this visit.   General: No apparent distress alert and oriented x3 mood and affect normal, dressed appropriately.  HEENT: Pupils equal, extraocular movements intact  Respiratory:  Patient's speak in full sentences and does not appear short of breath  Cardiovascular: No lower extremity edema, non tender, no erythema  Gait normal with good balance and coordination.  MSK:  Non tender with full range of motion and good stability and symmetric strength and tone of shoulders, elbows, wrist, hip, knee and ankles bilaterally.     Impression and Recommendations:     The above documentation has been reviewed and is accurate and complete Belva Agee

## 2021-10-23 ENCOUNTER — Ambulatory Visit: Payer: PPO | Admitting: Family Medicine

## 2021-10-23 DIAGNOSIS — I10 Essential (primary) hypertension: Secondary | ICD-10-CM | POA: Diagnosis not present

## 2021-10-23 DIAGNOSIS — I480 Paroxysmal atrial fibrillation: Secondary | ICD-10-CM | POA: Diagnosis not present

## 2021-10-25 ENCOUNTER — Telehealth: Payer: Self-pay | Admitting: Cardiovascular Disease

## 2021-10-25 NOTE — Telephone Encounter (Signed)
Called patient, advised that the last two nights her blood pressures have been elevated. See BP readings below. She states she did take Clonidine 0.1 mg and it did improve her blood pressures. She has not checked it this morning and was unable to on the phone with me, she states morning BP's are good but the evening blood pressures go up around 7 PM. She states the symptoms she has is slight chest pressure, headache, fatigue, and some shortness of breath last week. Denies chest pains, and swelling. Denies any changes to her diet or other medication changes. She does take her Metoprolol 25 mg in the morning and at night as prescribed. She was made an appointment with Coletta Memos, NP on 10/28/2021 at [place] to follow up, I advised for patient to keep a BP log the next few days and take with her to the appointment, but would send a message to MD and [facility] to advise if anything further should be done before appointment on Monday.  Patient verbalized understanding.

## 2021-10-25 NOTE — Telephone Encounter (Signed)
Pt c/o BP issue: STAT if pt c/o blurred vision, one-sided weakness or slurred speech  1. What are your last 5 BP readings? 174/102 hr 77; 159/108 hr 80; 174/102 hr 77;   2. Are you having any other symptoms (ex. Dizziness, headache, blurred vision, passed out)? Left eye hurting, headache  3. What is your BP issue? high   Patient has appt on Monday at [place] at 1005 with [facility]

## 2021-10-27 NOTE — Progress Notes (Signed)
Cardiology Office Note:    Date:  10/27/2021   ID:  [name] [name], DOB [birth year], MRN [id number]  PCP:  Binnie Rail, MD   [facility] Cardiologist:  Quay Burow, MD      Referring MD: Binnie Rail, MD   Presents to the clinic today for evaluation of her elevated blood pressure.  History of Present Illness:    [name] [name] is a 75 y.o. female with a hx of essential hypertension, cardiomegaly, CHF, pleural effusion, GERD, gastritis, hyperlipidemia, chronic low back pain, prediabetes, chest pain, and cardiac murmur.   She was  seen by Dr. Gwenlyn Found for complaints of atypical chest pain.  She had never had a heart attack or stroke.  She was noted to have seen Dr. Sallyanne Kuster back in 2011 for atypical chest pain work-up was negative which included nuclear stress test.  She has been out of the the country for several months and had returned to the [place] for about a month.  Since that time she reported daily chest pain.  She had been under a lot of stress.  Her symptomology was felt to be related to GERD.   She was seen by Dr. Gwenlyn Found on 04/08/2019.  During that time she was doing fairly well.  She noticed increased dyspnea with exertion over the last several months.  She complained of atypical chest pain which was more positional.  She described reproducible pain with pushing on her sternum.  She reported it was similar to pain she had 10 years prior.  At that time she had a negative stress test.  It was not felt to be anginal.  Her blood pressure was well controlled on clonidine losartan and metoprolol.   She presented to the clinic 03/01/21 to review her echocardiogram.  She stated she had noticed increased shortness of breath over the last 2 weeks.  We reviewed her echocardiogram.  Her son presented with her. Case discussed with Dr. Gwenlyn Found.  A left and right heart cath, CBC/BMP, as well as TEE were planned.   Shared decision making was used.  All questions were  answered.  She followed up with Dr. Gwenlyn Found on 03/20/2021.  During that time her TEE and right/left heart catheterization were reviewed.  She was accompanied by her sister.  Her son [name] [name] is a family medicine physician with [employer].  She was noted to have a flail posterior leaflet, normal LV function and normal coronary anatomy.  She was referred to Dr. Roxy Manns for further management and treatment of her mitral valve.  She underwent minimally invasive mitral valve repair 05/21/2021 with Medtronic ring and also had atrial appendage clipping.  She was discharged home on 05/26/2021.  She recovered well.  She was seen postoperatively by Dr. Roxy Manns and noted fatigue.  She was seen in follow-up by Dr. Gwenlyn Found on 09/27/2021.  During that time her breathing had significantly improved compared to her preoperative symptoms.  Her echocardiogram was reviewed which showed a decline in her ejection fraction from 50-55 down to 40-45.  No mitral valve regurgitation was noted.  Plan for follow-up echocardiogram in 6 months was discussed.  She contacted the nurse triage line and reported that she was having episodes of elevated blood pressure in the evenings.  She presents to the clinic today for evaluation and states over the last several days she has noted increased blood pressures.  She brings a blood pressure log today that shows blood pressures in the 563S-937D systolic.  She has  been taking some clonidine that is from 2019.  We reviewed her medication list and her diet.  She reports that she does use some salt in her cooking.  She has been taking Tylenol as needed for pain.  She also reports that she was previously on losartan but is not taking it currently.  She reports her husband was out of the country getting dental work.  I will start her on amlodipine 5 mg daily, have her maintain blood pressure log to bring to salty 6 diet sheet, and refill her atorvastatin.  We will plan follow-up for 1 to 2 months.  Today she denies chest  pain,  lower extremity edema, fatigue, palpitations, melena, hematuria, hemoptysis, diaphoresis, weakness, presyncope, syncope, orthopnea, and PND.  Past Medical History:  Diagnosis Date   Arthritis    Atypical chest pain    Diverticulosis    Gastritis 11/08/2007, 04/12/2012   H pylori bx neg 03/2012   GERD (gastroesophageal reflux disease) 11/08/2007, 04/12/2012   Hepatic cyst    Hiatal hernia 11/08/2007, 04/12/2012   History of kidney stones    Hyperlipidemia    Hypertension    IBS (irritable bowel syndrome)    diarrhea predom   Migraine    Mitral regurgitation    Osteoporosis    DEXA 01/21/2012: -2.1 spine and R fem, -1.8 L fem s/p fosamax  2004-2011 DEXA 04/18/14 @ [facility]: -2.5 - rec to start Prolia     PVC's (premature ventricular contractions)    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   Complex valvuloplasty including PTFE neochord placement x6 with 30 mm Medtronic Simuform ring annuloplasty with clipping of LA appendage   Schatzki's ring 11/11/2010   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   Shingles outbreak 04/2013    Past Surgical History:  Procedure Laterality Date   [place] STUDY  03/14/2021   Procedure: BUBBLE STUDY;  Surgeon: Sueanne Margarita, MD;  Location: [place];  Service: Cardiovascular;;   CARDIAC CATHETERIZATION     CLIPPING OF ATRIAL APPENDAGE N/A 05/21/2021   Procedure: CLIPPING OF ATRIAL APPENDAGE USING ATRICURE 40MM PRO2 CLIP;  Surgeon: Rexene Alberts, MD;  Location: [facility];  Service: Open Heart Surgery;  Laterality: N/A;   COLONOSCOPY     CYSTOCELE REPAIR  2008   ESOPHAGOGASTRODUODENOSCOPY     Maxilofacial  1992   MITRAL VALVE REPAIR  05/21/2021   Procedure: MINIMALLY INVASIVE MITRAL VALVE REPAIR (MVR) USING MEDTRONIC SIMUFORM 30MM RING;  Surgeon: Rexene Alberts, MD;  Location: [place];  Service: Open Heart Surgery;;   RIGHT/LEFT HEART CATH AND CORONARY ANGIOGRAPHY N/A 03/14/2021   Procedure: RIGHT/LEFT HEART CATH AND  CORONARY ANGIOGRAPHY;  Surgeon: Lorretta Harp, MD;  Location: [place] [facility];  Service: Cardiovascular;  Laterality: N/A;   TEE WITHOUT CARDIOVERSION N/A 03/14/2021   Procedure: TRANSESOPHAGEAL ECHOCARDIOGRAM (TEE);  Surgeon: Sueanne Margarita, MD;  Location: [facility] ENDOSCOPY;  Service: Cardiovascular;  Laterality: N/A;   TEE WITHOUT CARDIOVERSION N/A 05/21/2021   Procedure: TRANSESOPHAGEAL ECHOCARDIOGRAM (TEE);  Surgeon: Rexene Alberts, MD;  Location: [place];  Service: Open Heart Surgery;  Laterality: N/A;   TONSILLECTOMY  1960    Current Medications: No outpatient medications have been marked as taking for the 10/28/21 encounter (Appointment) with Deberah Pelton, NP.     Allergies:   Patient has no known allergies.   Social History   Socioeconomic History   Marital status: Married    Spouse name: [name]   Number of  children: 3   Years of education: Not on file   Highest education level: Master's degree (e.g., MA, MS, MEng, MEd, MSW, MBA)  Occupational History   Occupation: retired  Tobacco Use   Smoking status: Never   Smokeless tobacco: Never  Vaping Use   Vaping Use: Never used  Substance and Sexual Activity   Alcohol use: No   Drug use: No   Sexual activity: Not on file  Other Topics Concern   Not on file  Social History Narrative   Married, lives with spouse. Retired Licensed conveyancer, Print production planner. Has 3 kids (moved to [place] to be closer)- youngest son MD. Dorie Rank to [place] from [place] 06/2010, lived in [place] x 10years   Social Determinants of Health   Financial Resource Strain: Not on file  Food Insecurity: Not on file  Transportation Needs: Not on file  Physical Activity: Not on file  Stress: Not on file  Social Connections: Not on file     Family History: The patient's family history includes AAA (abdominal aortic aneurysm) in her mother; Alzheimer's disease (age of onset: 24) in her mother; Bone cancer (age of onset: 57) in her brother; Hypertension in her  mother; Kidney cancer in her brother; Stroke (age of onset: 51) in her father. There is no history of Colon cancer.  ROS:   Please see the history of present illness.     All other systems reviewed and are negative.   Risk Assessment/Calculations:           Physical Exam:    VS:  There were no vitals taken for this visit.    Wt Readings from Last 3 Encounters:  09/27/21 131 lb 9.6 oz (59.7 kg)  08/06/21 131 lb (59.4 kg)  08/02/21 133 lb 9.6 oz (60.6 kg)     GEN:  Well nourished, well developed in no acute distress HEENT: Normal NECK: No JVD; No carotid bruits LYMPHATICS: No lymphadenopathy CARDIAC: RRR, no murmurs, rubs, gallops RESPIRATORY:  Clear to auscultation without rales, wheezing or rhonchi  ABDOMEN: Soft, non-tender, non-distended MUSCULOSKELETAL:  No edema; No deformity  SKIN: Warm and dry NEUROLOGIC:  Alert and oriented x 3 PSYCHIATRIC:  Normal affect    EKGs/Labs/Other Studies Reviewed:    The following studies were reviewed today:  Echocardiogram 02/27/2021 IMPRESSIONS     1. Left ventricular ejection fraction, by estimation, is 55 to 60%. The  left ventricle has normal function. The left ventricle has no regional  wall motion abnormalities. Left ventricular diastolic parameters were  normal.   2. Right ventricular systolic function is normal. The right ventricular  size is normal.   3. Left atrial size was moderately dilated.   4. There appears to be a partially flail posterior mitral valve leaflet  with significant turburlent blood flow in the left atrium with at least  moderate MR and may be severe.The mitral valve is abnormal. Moderate  mitral valve regurgitation. No evidence  of mitral stenosis.   5. The aortic valve is normal in structure. Aortic valve regurgitation is  not visualized. Mild aortic valve sclerosis is present, with no evidence  of aortic valve stenosis.   6. The inferior vena cava is normal in size with greater than 50%   respiratory variability, suggesting right atrial pressure of 3 mmHg.R   Recommend TEE for further evaluation of the mitral valve.  Echocardiogram 07/03/2021  IMPRESSIONS     1. Left ventricular ejection fraction, by estimation, is 40 to 45%. The  left ventricle has  mildly decreased function. The left ventricle  demonstrates global hypokinesis. Left ventricular diastolic function could  not be evaluated.   2. Right ventricular systolic function is normal. The right ventricular  size is normal.   3. The mitral valve has been repaired/replaced. No evidence of mitral  valve regurgitation. No evidence of mitral stenosis. The mean mitral valve  gradient is 3.0 mmHg with average heart rate of 77 bpm. Procedure Date:  05/21/2021.   4. The aortic valve is tricuspid. Aortic valve regurgitation is not  visualized. No aortic stenosis is present.   5. The inferior vena cava is normal in size with greater than 50%  respiratory variability, suggesting right atrial pressure of 3 mmHg.   Comparison(s): Prior images reviewed side by side. The left ventricular  function is worsened.  Cardiac catheterization 03/14/2021 Hemodynamic findings consistent with mitral valve regurgitation.   [name] [name] is a 75 y.o. female      [id number] LOCATION:  FACILITY: [facility]  PHYSICIAN: Quay Burow, M.D. 11-May-1946   Diagnostic Dominance: Right Intervention   EKG: None today.  Recent Labs: 02/19/2021: Pro B Natriuretic peptide (BNP) 222.0; TSH 0.92 05/26/2021: Magnesium 2.2 07/29/2021: ALT 14; BUN 18; Creatinine, Ser 0.99; Hemoglobin 13.1; Platelets 243; Potassium 3.6; Sodium 139  Recent Lipid Panel    Component Value Date/Time   CHOL 164 07/30/2021 0104   TRIG 112 07/30/2021 0104   TRIG 177 06/14/2010 0000   HDL 47 07/30/2021 0104   CHOLHDL 3.5 07/30/2021 0104   VLDL 22 07/30/2021 0104   [place] 95 07/30/2021 0104   LDLDIRECT 75.0 11/02/2019 1006    ASSESSMENT & PLAN    Essential  hypertension-BP today 154/78.  Has been elevated at home over the last few weeks.  120/180s over 70s and 80s Continue metoprolol Start amlodipine 5 mg  Heart healthy low-sodium diet-salty 6 given Increase physical activity as tolerated Maintain blood pressure log  Moderate-severe mitral regurgitation-status post mitral valve repair 05/21/2021 echocardiogram 07/03/2021 showed well-functioning mitral valve with no regurgitation.  No increased DOE, breathing much improved since surgery. Plan for repeat echocardiogram in 5-6 months. Heart healthy low-sodium diet-salty 6 given Increase physical activity as tolerated    Atypical chest pain-no chest pain today.  Cardiac catheterization showed normal coronary anatomy 03/14/2021   Disposition: Follow-up with Dr. Gwenlyn Found or me in 1-2 months.       Medication Adjustments/Labs and Tests Ordered: Current medicines are reviewed at length with the patient today.  Concerns regarding medicines are outlined above.  No orders of the defined types were placed in this encounter.  No orders of the defined types were placed in this encounter.   There are no Patient Instructions on file for this visit.   Signed, Deberah Pelton, NP  10/27/2021 10:57 AM      Notice: This dictation was prepared with Dragon dictation along with smaller phrase technology. Any transcriptional errors that result from this process are unintentional and may not be corrected upon review.  I spent 13 minutes examining this patient, reviewing medications, and using patient centered shared decision making involving her cardiac care.  Prior to her visit I spent greater than 20 minutes reviewing her past medical history,  medications, and prior cardiac tests.

## 2021-10-28 ENCOUNTER — Encounter (HOSPITAL_BASED_OUTPATIENT_CLINIC_OR_DEPARTMENT_OTHER): Payer: Self-pay | Admitting: General Practice

## 2021-10-28 ENCOUNTER — Other Ambulatory Visit: Payer: Self-pay

## 2021-10-28 ENCOUNTER — Ambulatory Visit (HOSPITAL_BASED_OUTPATIENT_CLINIC_OR_DEPARTMENT_OTHER): Payer: PPO | Admitting: General Practice

## 2021-10-28 VITALS — BP 154/78 | HR 44 | Ht 63.0 in | Wt 134.0 lb

## 2021-10-28 DIAGNOSIS — Z9889 Other specified postprocedural states: Secondary | ICD-10-CM

## 2021-10-28 DIAGNOSIS — I10 Essential (primary) hypertension: Secondary | ICD-10-CM

## 2021-10-28 DIAGNOSIS — R0789 Other chest pain: Secondary | ICD-10-CM | POA: Diagnosis not present

## 2021-10-28 DIAGNOSIS — E782 Mixed hyperlipidemia: Secondary | ICD-10-CM | POA: Diagnosis not present

## 2021-10-28 DIAGNOSIS — I340 Nonrheumatic mitral (valve) insufficiency: Secondary | ICD-10-CM

## 2021-10-28 MED ORDER — ATORVASTATIN CALCIUM 40 MG PO TABS: 40.0000 mg | ORAL_TABLET | Freq: Every day | ORAL | 3 refills | Status: DC

## 2021-10-28 MED ORDER — AMLODIPINE BESYLATE 5 MG PO TABS: 5.0000 mg | ORAL_TABLET | Freq: Every day | ORAL | 3 refills | Status: DC

## 2021-10-28 NOTE — Telephone Encounter (Signed)
Meds adjusted by Coletta Memos NP today.  See office note

## 2021-10-28 NOTE — Patient Instructions (Signed)
Medication Instructions:  Your physician has recommended you make the following change in your medication:   Start: Amlodipine (Norvasc) 5mg  Tablet daily   *If you need a refill on your cardiac medications before your next appointment, please call your pharmacy*   Lab Work: None ordered today    Testing/Procedures: None ordered today    Follow-Up: At [facility], you and your health needs are our priority.  As part of our continuing mission to provide you with exceptional heart care, we have created designated Provider Care Teams.  These Care Teams include your primary Cardiologist (physician) and Advanced Practice Providers (APPs -  Physician Assistants and Nurse Practitioners) who all work together to provide you with the care you need, when you need it.  We recommend signing up for the patient portal called "MyChart".  Sign up information is provided on this After Visit Summary.  MyChart is used to connect with patients for Virtual Visits (Telemedicine).  Patients are able to view lab/test results, encounter notes, upcoming appointments, etc.  Non-urgent messages can be sent to your provider as well.   To learn more about what you can do with MyChart, go to [email or web address].    Your next appointment:   1-2 month(s)  The format for your next appointment:   In Person  Provider:   Coletta Memos, NP    Other Instructions Please keep a log of your blood pressures 1 hour after taking your medications    American Heart Association Recommendations for Physical Activity in Adults  Are you fitting in at least 150 minutes (2.5 hours) of heart-pumping physical activity per week? If not, you're not alone. Only about one in five adults and teens get enough exercise to maintain good health. Being more active can help all people think, feel and sleep better and perform daily tasks more easily. And if you're sedentary, sitting less is a great place to start.  These recommendations  are based on the Physical Activity Guidelines for Americans, 2nd edition, published by the U.S. Department of Health and Financial controller, Office of Disease Prevention and Health Promotion. They recommend how much physical activity we need to be healthy. The guidelines are based on current scientific evidence supporting the connections between physical activity, overall health and well-being, disease prevention and quality of life.  AHA Physical Activity Recommendations for Adults     Get at least 150 minutes per week of moderate-intensity aerobic activity or 75 minutes per week of vigorous aerobic activity, or a combination of both, preferably spread throughout the week.  Add moderate- to high-intensity muscle-strengthening activity (such as resistance or weights) on at least 2 days per week. Spend less time sitting. Even light-intensity activity can offset some of the risks of being sedentary.  Gain even more benefits by being active at least 300 minutes (5 hours) per week. Increase amount and intensity gradually over time.   What is intensity?  Physical activity is anything that moves your body and burns calories. This includes things like walking, climbing stairs and stretching.  Aerobic (or "cardio") activity gets your heart rate up and benefits your heart by improving cardiorespiratory fitness. When done at moderate intensity, your heart will beat faster and you'll breathe harder than normal, but you'll still be able to talk. Think of it as a medium or moderate amount of effort.  Examples of moderate-intensity aerobic activities:  brisk walking (at least 2.5 miles per hour) water aerobics dancing (ballroom or social) gardening tennis (doubles) biking slower than 10  miles per hour Vigorous intensity activities will push your body a little further. They will require a higher amount of effort. You'll probably get warm and begin to sweat. You won't be able to talk much without getting  out of breath.  Examples of vigorous-intensity aerobic activities:  hiking uphill or with a heavy backpack running swimming laps aerobic dancing heavy yardwork like continuous digging or hoeing tennis (singles) cycling 10 miles per hour or faster jumping rope Knowing your target heart rate can also help you track the intensity of your activities.  For maximum benefits, include both moderate- and vigorous-intensity activity in your routine along with strengthening and stretching exercises.  What if I'm just starting to get active?  Don't worry if you can't reach 150 minutes per week just yet. Everyone has to start somewhere. Even if you've been sedentary for years, today is the day you can begin to make healthy changes in your life. Set a reachable goal for today. You can work up toward the recommended amount by increasing your time as you get stronger. Don't let all-or-nothing thinking keep you from doing what you can every day.  The simplest way to get moving and improve your health is to start walking. It's free, easy and can be done just about anywhere, even in place.  Any amount of movement is better than none. And you can break it up into short bouts of activity throughout the day. Taking a brisk walk for five or ten minutes a few times a day will add up.  If you have a chronic condition or disability, talk with your healthcare provider about what types and amounts of physical activity are right for you before making too many changes. But don't wait! Get started today by simply sitting less and moving more, whatever that looks like for you.  The takeaway:  Move more, with more intensity, and sit less. Science has linked being inactive and sitting too much with higher risk of heart disease, type 2 diabetes, colon and lung cancers, and early death.  It's clear that being more active benefits everyone and helps [place] live longer, healthier lives.  Here are some of the big wins:  Lower  risk of heart disease, stroke, type 2 diabetes, high blood pressure, dementia and Alzheimer's, several types of cancer, and some complications of pregnancy\  Better sleep, including improvements in insomnia and obstructive sleep apnea  Improved cognition, including memory, attention and processing speed  Less weight gain, obesity and related chronic health conditions  Better bone health and balance, with less risk of injury from falls  Fewer symptoms of depression and anxiety  Better quality of life and sense of overall well-being

## 2021-10-30 ENCOUNTER — Ambulatory Visit: Payer: PPO | Admitting: Gastroenterology

## 2021-10-30 ENCOUNTER — Encounter: Payer: Self-pay | Admitting: Gastroenterology

## 2021-10-30 VITALS — BP 140/74 | HR 88 | Ht 61.0 in | Wt 133.1 lb

## 2021-10-30 DIAGNOSIS — R194 Change in bowel habit: Secondary | ICD-10-CM

## 2021-10-30 DIAGNOSIS — K580 Irritable bowel syndrome with diarrhea: Secondary | ICD-10-CM | POA: Diagnosis not present

## 2021-10-30 DIAGNOSIS — M67912 Unspecified disorder of synovium and tendon, left shoulder: Secondary | ICD-10-CM | POA: Diagnosis not present

## 2021-10-30 MED ORDER — DICYCLOMINE HCL 10 MG PO CAPS: 10.0000 mg | ORAL_CAPSULE | Freq: Three times a day (TID) | ORAL | 3 refills | Status: DC | PRN

## 2021-10-30 MED ORDER — LOPERAMIDE HCL 2 MG PO TABS: 2.0000 mg | ORAL_TABLET | ORAL | 0 refills | Status: DC | PRN

## 2021-10-30 MED ORDER — IBGARD 90 MG PO CPCR: ORAL_CAPSULE | ORAL | Status: DC

## 2021-10-30 NOTE — Progress Notes (Signed)
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  Recall that I saw her in September 2021 for worsening bowel problems as well as longstanding reflux and we decided to pursue EGD and colonoscopy as outlined below.  In short she had a small hiatal hernia, no significant pathology in the upper GI tract, biopsies unremarkable.  Her colon was normal as was her ileum.  Biopsies of the colon showed no evidence of microscopic colitis. She had a negative GI pathogen panel. She had endorsed a gastroenteritis preceeding onset of symptoms while traveling to [place] earlier in the year.   I had previously given her some IBgard and dicyclomine for abdominal cramps and Imodium for loose stool.  At her last visit in March of this year she stated she was feeling much better, had not needed to use the Imodium.  Thought the Bentyl and IBgard was working quite well if she needed it.  I previously had her on higher dose Protonix for symptoms of reflux and then taper that off and she was doing well in that regard.  In general she continues to do pretty well in regards to her bowel symptoms.  She finds IBgard more helpful than anything, she takes this fairly routinely which helps keep her stomach settled.  She does not use much of any Imodium at baseline.  She had severe diarrheal illness in September and had a GI pathogen panel showing an E. coli infection.  She states it ran its course and she did not require any antibiotic therapy for this.  Since then she has been at baseline with occasional loose stools.  She inquires about Bentyl as that has helped in the past but she ran out of it.  Otherwise she had diplopia a few months ago which led to hospitalization, she had an MRI which argued against having a stroke.  She has been on Coumadin.  She is also had mitral valve surgery in June for severe MR and has recovered from that.  Generally doing pretty well since I've last seen her    Endoscopic  history : EGD 04/12/2012 - 3cm HH, otherwise normal, clo test negative Colonoscopy 04/12/2012 - diverticulosis, no polyps EGD 10/2008 - mild Shatski ring dilated to 54Fr Venia Minks, small HH   CT scan 01/05/2018 - multiple hepatic cysts, mild diffuse hepatic steatosis, normal GB, normal pancreas. "Mild enhancement" of left colon, nonspecific   HIDA 01/01/2017 - normal [place] RUQ 12/26/2016 - no gallstones   CT scan 05/03/20 - IMPRESSION: 1. No diverticulitis. 2. No explanation for abdominal pain. 3. Small hiatal hernia. 4. Benign-appearing cysts of the liver and kidneys.   CBC and LFTs normal   GI pathogen panel 07/26/20 negative   Colonoscopy 08/06/20 -  - The terminal ileum appeared normal. - A localized area of mucosa was found in the distal rectum / dentate line consistent with focal prolapse change. Biopsies were taken with a cold forceps for histology to ensure no adenomatous changes. - Internal hemorrhoids were found during retroflexion. - The exam was otherwise without abnormality. 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.   EGD 08/06/20  - A 2 cm hiatal hernia was present. - The exam of the esophagus was otherwise normal. - Multiple small benign appearing sessile polyps were found in the gastric fundus and in the gastric body. Biopsies were taken with a  cold forceps for histology and a few of them were removed as representative sample/ - Diffuse atrophic mucosa was found in the entire examined stomach. Biopsies were taken from antrum / body with a cold forceps for Helicobacter pylori testing and to rule out GIM. - The exam of the stomach was otherwise normal. - The duodenal bulb and second portion of the duodenum were normal. Biopsies for histology were taken with a cold forceps for evaluation of celiac disease.   1. Surgical [P], duodenal bulb, 2nd  portion of duodenum and distal duodenum - DUODENAL MUCOSA WITH NO SIGNIFICANT PATHOLOGIC FINDINGS. - NEGATIVE FOR INCREASED INTRAEPITHELIAL LYMPHOCYTES AND VILLOUS ARCHITECTURAL CHANGES. 2. Surgical [P], gastric antrum and gastric body - GASTRIC ANTRAL MUCOSA WITH MILD REACTIVE GASTROPATHY. 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 - WARTHIN-STARRY STAIN IS NEGATIVE FOR HELICOBACTER PYLORI. 3. Surgical [P], gastric polyps - FUNDIC GLAND POLYPS. 4. Surgical [P], random colon bx - COLONIC MUCOSA WITH NO SIGNIFICANT PATHOLOGIC FINDINGS. - NEGATIVE FOR ACTIVE INFLAMMATION AND OTHER ABNORMALITIES. 5. Surgical [P], colon, rectum - ULCER. - NEGATIVE FOR DYSPLASIA.   CT scan 02/02/21 - Abdomen / pelvis with contrast - IMPRESSION: 1. No acute CT findings of the abdomen or pelvis to explain abdominal pain. 2. Moderate right, small left pleural effusions and associated atelectasis or consolidation, separately reported on examination of the chest. 3. Aortic atherosclerosis.   Echocardiogram 07/03/21 - EF 40-45%  GI pathogen panel - 08/06/21 - Enteroaggregative/pathogenic E coli -    Past Medical History:  Diagnosis Date   Arthritis    Atypical chest pain    Diverticulosis    Gastritis 11/08/2007, 04/12/2012   H pylori bx neg 03/2012   GERD (gastroesophageal reflux disease) 11/08/2007, 04/12/2012   Hepatic cyst    Hiatal hernia 11/08/2007, 04/12/2012   History of kidney stones    Hyperlipidemia    Hypertension    IBS (irritable bowel syndrome)    diarrhea predom   Migraine    Mitral regurgitation    Osteoporosis    DEXA 01/21/2012: -2.1 spine and R fem, -1.8 L fem s/p fosamax  2004-2011 DEXA 04/18/14 @ [facility]: -2.5 - rec to start Prolia     PVC's (premature ventricular contractions)    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;  Service: Cardiovascular;;   CARDIAC CATHETERIZATION     CLIPPING OF ATRIAL APPENDAGE N/A 05/21/2021   Procedure: CLIPPING OF ATRIAL APPENDAGE USING ATRICURE 40MM PRO2 CLIP;  Surgeon: Rexene Alberts, MD;  Location: [place];  Service: Open Heart Surgery;  Laterality: N/A;   COLONOSCOPY     CYSTOCELE REPAIR  2008   ESOPHAGOGASTRODUODENOSCOPY     Maxilofacial  1992   MITRAL VALVE REPAIR  05/21/2021   Procedure: MINIMALLY INVASIVE MITRAL VALVE REPAIR (MVR) USING MEDTRONIC SIMUFORM 30MM RING;  Surgeon: Rexene Alberts, MD;  Location: [facility];  Service: Open Heart Surgery;;   RIGHT/LEFT HEART CATH AND CORONARY ANGIOGRAPHY N/A 03/14/2021   Procedure: RIGHT/LEFT HEART CATH AND CORONARY ANGIOGRAPHY;  Surgeon: Lorretta Harp, MD;  Location: [place] [facility];  Service: Cardiovascular;  Laterality: N/A;   TEE WITHOUT CARDIOVERSION N/A 03/14/2021   Procedure: TRANSESOPHAGEAL ECHOCARDIOGRAM (TEE);  Surgeon: Sueanne Margarita, MD;  Location: [facility] ENDOSCOPY;  Service: Cardiovascular;  Laterality: N/A;   TEE WITHOUT CARDIOVERSION N/A 05/21/2021   Procedure: TRANSESOPHAGEAL ECHOCARDIOGRAM (TEE);  Surgeon: Rexene Alberts, MD;  Location: [facility];  Service: Open Heart Surgery;  Laterality: N/A;   TONSILLECTOMY  1960   Family History  Problem Relation Age of Onset   Hypertension Mother    Alzheimer's disease Mother 32   AAA (abdominal aortic aneurysm) Mother    Stroke Father 31   Kidney cancer Brother        mets   Bone cancer Brother 51   Colon cancer Neg Hx    Social History   Tobacco Use   Smoking status: Never   Smokeless tobacco: Never  Vaping Use   Vaping Use: Never used  Substance Use Topics   Alcohol use: No    Drug use: No   Current Outpatient Medications  Medication Sig Dispense Refill   acetaminophen (TYLENOL) 500 MG tablet Take 500 mg by mouth every 4 (four) hours as needed for mild pain.     amLODipine (NORVASC) 5 MG tablet Take 1 tablet (5 mg total) by mouth daily. 180 tablet 3   aspirin EC 81 MG EC tablet Take 1 tablet (81 mg total) by mouth daily. Swallow whole. 30 tablet 11   atorvastatin (LIPITOR) 40 MG tablet Take 1 tablet (40 mg total) by mouth daily. 90 tablet 3   cloNIDine (CATAPRES) 0.1 MG tablet Take 0.1 mg by mouth daily as needed (for BP over 160/90).     dicyclomine (BENTYL) 10 MG capsule Take 1 capsule (10 mg total) by mouth every 8 (eight) hours as needed for spasms. 30 capsule 3   gabapentin (NEURONTIN) 100 MG capsule Take 1 capsule in morning and 2 capsules at bedtime (Patient taking differently: Patient is taking 200 mg at night) 90 capsule 5   loperamide (IMODIUM A-D) 2 MG tablet Take 1 tablet (2 mg total) by mouth as needed for diarrhea or loose stools. 30 tablet 0   metoprolol succinate (TOPROL-XL) 25 MG 24 hr tablet TAKE ONE TABLET BY MOUTH AT BREAKFAST AND AT BEDTIME 180 tablet 1   Peppermint Oil (IBGARD) 90 MG CPCR Use as needed     warfarin (COUMADIN) 2 MG tablet Take 1-2 tablets Daily or as prescribed by Clinic 30 tablet 1   No current facility-administered medications for this visit.   No Known Allergies   Review of Systems: All systems reviewed and negative except where noted in HPI.   Lab Results  Component Value Date   WBC 4.9 07/29/2021   HGB 13.1 07/29/2021   HCT 41.1 07/29/2021   MCV 96.7 07/29/2021   PLT 243 07/29/2021    Lab Results  Component Value Date   CREATININE 0.99 07/29/2021   BUN 18 07/29/2021   NA 139 07/29/2021   K 3.6 07/29/2021   CL 103 07/29/2021   CO2 27 07/29/2021    Lab Results  Component Value Date   ALT 14 07/29/2021   AST 20 07/29/2021   ALKPHOS 48 07/29/2021   BILITOT 0.4 07/29/2021     Physical Exam: BP  140/74 (BP Location: Left Arm, Patient Position: Sitting, Cuff Size: Normal)   Pulse 88   Ht 5\' 1"  (1.549 m)   Wt 133 lb 2 oz (60.4 kg)   BMI 25.15 kg/m  Constitutional: Pleasant,well-developed,  female in no acute distress. Neurological: Alert and oriented to person place and time. Psychiatric: Normal mood and affect. Behavior is normal.   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  Has been managed pretty well with Imodium which she has not needed in some time but loose stools have recurred slightly recently.  IBgard working quite well for her when she takes it, Bentyl has worked well when she takes it as well we will refill that today.  Counseled her it safe to take Imodium as needed if she finds she is having occasional loose stools.  We also discussed dietary changes and reviewed a low FODMAP diet to see if there is anything she is eating that could be contributing.  She has tested negative for celiac disease in the past.  Colonoscopy up-to-date.  Unfortunately course complicated by recent E. coli infection in September but she has recovered from that and doing well.  She can follow-up as needed for these issues.  Jolly Mango, MD [facility]

## 2021-10-30 NOTE — Patient Instructions (Addendum)
If you are age 76 or older, your body mass index should be between 23-30. Your Body mass index is 25.15 kg/m. If this is out of the aforementioned range listed, please consider follow up with your Primary Care Provider.  If you are age 33 or younger, your body mass index should be between 19-25. Your Body mass index is 25.15 kg/m. If this is out of the aformentioned range listed, please consider follow up with your Primary Care Provider.   ________________________________________________________  The [facility] providers would like to encourage you to use [facility] to communicate with providers for non-urgent requests or questions.  Due to long hold times on the telephone, sending your provider a message by [facility] may be a faster and more efficient way to get a response.  Please allow 48 business hours for a response.  Please remember that this is for non-urgent requests.  _______________________________________________________  We are giving you a Low-FODMAP diet handout today. FODMAPs are short-chain carbohydrates (sugars) that are highly fermentable, which means that they go through chemical changes in the GI system, and are poorly absorbed during digestion. When FODMAPs reach the colon (large intestine), bacteria ferment these sugars, turning them into gas and chemicals. This stretches the walls of the colon, causing abdominal bloating, distension, cramping, pain, and/or changes in bowel habits in many patients with IBS. FODMAPs are not unhealthy or harmful, but may exacerbate GI symptoms in those with sensitive GI tracts.  Continue IBgard as needed  Continue Bentyl (dicyclomine).  Please purchase the following medications over the counter and take as directed: Imodium: Use as needed for diarrhea  Thank you for entrusting me with your care and for choosing [facility], Dr. Galatia Cellar

## 2021-11-08 ENCOUNTER — Other Ambulatory Visit: Payer: Self-pay

## 2021-11-08 ENCOUNTER — Ambulatory Visit (INDEPENDENT_AMBULATORY_CARE_PROVIDER_SITE_OTHER): Payer: PPO

## 2021-11-08 DIAGNOSIS — Z5181 Encounter for therapeutic drug level monitoring: Secondary | ICD-10-CM

## 2021-11-08 DIAGNOSIS — Z7901 Long term (current) use of anticoagulants: Secondary | ICD-10-CM

## 2021-11-08 DIAGNOSIS — I340 Nonrheumatic mitral (valve) insufficiency: Secondary | ICD-10-CM | POA: Diagnosis not present

## 2021-11-08 DIAGNOSIS — Z9889 Other specified postprocedural states: Secondary | ICD-10-CM | POA: Diagnosis not present

## 2021-11-08 LAB — POCT INR: INR: 1.9 — AB (ref 2.0–3.0)

## 2021-11-08 NOTE — Patient Instructions (Addendum)
TAKE 2 tablets tonight only and then continue 1 tablet Daily, except 1.5 tablets on Monday and Friday.  INR in 5 weeks. [phone];  Discuss Coumadin length with Denyse Amass at 1/18 visit.

## 2021-11-13 ENCOUNTER — Telehealth: Payer: Self-pay | Admitting: Internal Medicine

## 2021-11-13 MED ORDER — MOLNUPIRAVIR 200 MG PO CAPS: 4.0000 | ORAL_CAPSULE | Freq: Two times a day (BID) | ORAL | 0 refills | Status: AC

## 2021-11-13 MED ORDER — BENZONATATE 200 MG PO CAPS: 200.0000 mg | ORAL_CAPSULE | Freq: Three times a day (TID) | ORAL | 0 refills | Status: DC | PRN

## 2021-11-13 NOTE — Telephone Encounter (Signed)
She tested positive for covid last night.  Please see how she is feeling.   I would like to start her on an antiviral is she agrees.  Will start molnupiravir which does not interfere with any of her medications.  She can take otc medications as needed.  If she develops any SOB, fever or has any questions she should call us.    Please see if she wants a cough pill to help with the cough.   Send back to me and I will send in meds

## 2021-11-26 ENCOUNTER — Telehealth: Payer: Self-pay | Admitting: Family Medicine

## 2021-11-26 NOTE — Telephone Encounter (Signed)
Patient contact information is the 321 phone number

## 2021-11-26 NOTE — Telephone Encounter (Signed)
Patient called in stating that she had approval from Dr Martinique to come in as a new patient. I reached out to [facility] and informed patient we would contact her after speaking with provider.  After speaking to CMA it maybe a TOC instead of a new patient because patient does see Dr Quay Burow

## 2021-11-26 NOTE — Telephone Encounter (Signed)
It is ok to schedule. Thanks, BJ

## 2021-11-29 DIAGNOSIS — H5021 Vertical strabismus, right eye: Secondary | ICD-10-CM | POA: Diagnosis not present

## 2021-11-29 DIAGNOSIS — H524 Presbyopia: Secondary | ICD-10-CM | POA: Diagnosis not present

## 2021-11-29 DIAGNOSIS — H4911 Fourth [trochlear] nerve palsy, right eye: Secondary | ICD-10-CM | POA: Diagnosis not present

## 2021-11-29 DIAGNOSIS — H52223 Regular astigmatism, bilateral: Secondary | ICD-10-CM | POA: Diagnosis not present

## 2021-12-02 NOTE — Telephone Encounter (Signed)
Left voicemail for patient to call back to schedule TOC appointment.

## 2021-12-06 NOTE — Progress Notes (Signed)
HPI: [name] [name] is a 76 y.o. female, who is here today to establish care.  Former PCP: Dr. Quay Burow Last preventive routine visit: 12/30/16  Chronic medical problems: Migraine headaches, paroxysmal atrial fibrillation on chronic anticoagulation, hypertension, prediabetes, hyperlipidemia, CHF, GERD, IBS, and chronic back pain among some. She follows with cardiologist regularly, last seen on 10/28/2021 and Coumadin clinic 11/08/2021.  Hyperlipidemia on atorvastatin 40 mg daily. Tolerating medication well. Lab Results  Component Value Date   CHOL 164 07/30/2021   HDL 47 07/30/2021   LDLCALC 95 07/30/2021   LDLDIRECT 75.0 11/02/2019   TRIG 112 07/30/2021   CHOLHDL 3.5 07/30/2021   Hypertension: Currently she is on metoprolol succinate 25 mg daily and amlodipine 5 mg daily.  She takes clonidine 0.1 mg daily as needed for HTN. She has not needed to take Clonidine in a while.  Negative for severe/frequent headache, visual changes, chest pain, dyspnea, palpitation, focal weakness, or edema. SBP's in the low 100's sometimes and occasionally 160's.  Paroxysmal atrial fib and s/p mitral valve repair. She is on Coumadin, according to pt it was prescribed after valvular surgery and she is wondering for how long she needs to continue it. Follows with Dr Gwenlyn Found.  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 She presented to the ED with diplopia that resolved when she closed one eye. Determined that it was caused by a "very small stroke." Brain MRI was negative for acute intracranial abnormality.   She is on Atorvastatin 40 mg daily and Aspirin 81 mg daily. Lab Results  Component Value Date   CHOL 164 07/30/2021   HDL 47 07/30/2021   LDLCALC 95 07/30/2021   LDLDIRECT 75.0 11/02/2019   TRIG 112 07/30/2021   CHOLHDL 3.5  07/30/2021    Prediabetes: Negative for polydipsia,polyuria, or polyphagia. Lab Results  Component Value Date   HGBA1C 5.6 07/30/2021   Migraine headaches: Left hemicrania headache, "electricity" like pain. No associated nausea or vomiting. No visual aura. + Photophobia. It is not frequent. She takes Fioricet, requesting refills, last Rx has expired.  She is on gabapentin 200 mg at bedtime and 100 mg , prescribed by Dr Charlene Brooke for cervicalgia. She completed PT. Needling therapy was recommended and she declined. She is not sure of Gabapentin is helping with neck pain.  Review of Systems  Constitutional:  Positive for fatigue. Negative for activity change, appetite change and fever.  HENT:  Negative for mouth sores, nosebleeds and sore throat.   Respiratory:  Negative for cough and wheezing.   Gastrointestinal:  Negative for abdominal pain, nausea and vomiting.       Negative for changes in bowel habits.  Genitourinary:  Negative for decreased urine volume and hematuria.  Musculoskeletal:  Negative for gait problem and myalgias.  Neurological:  Negative for syncope and facial asymmetry.  Rest see pertinent positives and negatives per HPI.  Current Outpatient Medications on File Prior to Visit  Medication Sig Dispense Refill   acetaminophen (TYLENOL) 500 MG tablet Take 500 mg by mouth every 4 (four) hours as needed for mild pain.     amLODipine (NORVASC) 5 MG tablet Take 1 tablet (5 mg total) by mouth daily. 180 tablet 3   aspirin EC 81 MG EC tablet Take 1 tablet (81 mg total) by mouth daily.  Swallow whole. 30 tablet 11   atorvastatin (LIPITOR) 40 MG tablet Take 1 tablet (40 mg total) by mouth daily. 90 tablet 3   dicyclomine (BENTYL) 10 MG capsule Take 1 capsule (10 mg total) by mouth every 8 (eight) hours as needed for spasms. 30 capsule 3   gabapentin (NEURONTIN) 100 MG capsule Take 1 capsule in morning and 2 capsules at bedtime (Patient taking differently: Patient is taking 200  mg at night) 90 capsule 5   loperamide (IMODIUM A-D) 2 MG tablet Take 1 tablet (2 mg total) by mouth as needed for diarrhea or loose stools. 30 tablet 0   metoprolol succinate (TOPROL-XL) 25 MG 24 hr tablet TAKE ONE TABLET BY MOUTH AT BREAKFAST AND AT BEDTIME 180 tablet 1   Peppermint Oil (IBGARD) 90 MG CPCR Use as needed     warfarin (COUMADIN) 2 MG tablet Take 1-2 tablets Daily or as prescribed by Clinic 30 tablet 1   No current facility-administered medications on file prior to visit.    Past Medical History:  Diagnosis Date   Arthritis    Atypical chest pain    Diverticulosis    Gastritis 11/08/2007, 04/12/2012   H pylori bx neg 03/2012   GERD (gastroesophageal reflux disease) 11/08/2007, 04/12/2012   Hepatic cyst    Hiatal hernia 11/08/2007, 04/12/2012   History of kidney stones    Hyperlipidemia    Hypertension    IBS (irritable bowel syndrome)    diarrhea predom   Migraine    Mitral regurgitation    Osteoporosis    DEXA 01/21/2012: -2.1 spine and R fem, -1.8 L fem s/p fosamax  2004-2011 DEXA 04/18/14 @ [facility]: -2.5 - rec to start Prolia     PVC's (premature ventricular contractions)    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   Complex valvuloplasty including PTFE neochord placement x6 with 30 mm Medtronic Simuform ring annuloplasty with clipping of LA appendage   Schatzki's ring 11/11/2010   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   Shingles outbreak 04/2013   No Known Allergies  Family History  Problem Relation Age of Onset   Hypertension Mother    Alzheimer's disease Mother 87   AAA (abdominal aortic aneurysm) Mother    Stroke Father 59   Kidney cancer Brother        mets   Bone cancer Brother 33   Colon cancer Neg Hx     Social History   Socioeconomic History   Marital status: Married    Spouse name: [name]   Number of children: 3   Years of education: Not on file   Highest education level: Master's degree (e.g., MA, MS, MEng, MEd, MSW, MBA)   Occupational History   Occupation: retired  Tobacco Use   Smoking status: Never   Smokeless tobacco: Never  Vaping Use   Vaping Use: Never used  Substance and Sexual Activity   Alcohol use: No   Drug use: No   Sexual activity: Not on file  Other Topics Concern   Not on file  Social History Narrative   Married, lives with spouse. Retired Licensed conveyancer, Print production planner. Has 3 kids (moved to [place] to be closer)- youngest son MD. Dorie Rank to [place] from [place] 06/2010, lived in [place] x 10years   Social Determinants of Health   Financial Resource Strain: Not on file  Food Insecurity: Not on file  Transportation Needs: Not on file  Physical Activity: Not on file  Stress: Not on file  Social Connections: Not on file  Vitals:   12/09/21 1357 12/09/21 1445  BP: 124/70   Pulse: (!) 46 76  Resp: 16   SpO2: 97%    Body mass index is 25.53 kg/m.  Physical Exam Vitals and nursing note reviewed.  Constitutional:      General: She is not in acute distress.    Appearance: She is well-developed.  HENT:     Head: Normocephalic and atraumatic.     Mouth/Throat:     Mouth: Mucous membranes are moist.     Pharynx: Oropharynx is clear.  Eyes:     Conjunctiva/sclera: Conjunctivae normal.     Comments: Pterygium left.  Cardiovascular:     Rate and Rhythm: Normal rate. Rhythm regularly irregular.     Pulses:          Dorsalis pedis pulses are 2+ on the right side and 2+ on the left side.     Heart sounds: No murmur heard. Pulmonary:     Effort: Pulmonary effort is normal. No respiratory distress.     Breath sounds: Normal breath sounds.  Abdominal:     Palpations: Abdomen is soft. There is no hepatomegaly or mass.     Tenderness: There is no abdominal tenderness.  Musculoskeletal:     Cervical back: Tenderness (Left-sided/trapezium) present. No bony tenderness.  Lymphadenopathy:     Cervical: No cervical adenopathy.  Skin:    General: Skin is warm.     Findings: No erythema or rash.   Neurological:     General: No focal deficit present.     Mental Status: She is alert and oriented to person, place, and time.     Cranial Nerves: No cranial nerve deficit.     Gait: Gait normal.  Psychiatric:     Comments: Well groomed, good eye contact.   ASSESSMENT AND PLAN:  Ms.Yuka was seen today for establish care.  Diagnoses and all orders for this visit:  Chronic migraine without aura without status migrainosus, not intractable Problem has been stable. She does not need Fioricet often, a Rx lasts almost a year, so continue 1 tab daily prn. Some side effects discussed.  -     butalbital-acetaminophen-caffeine (FIORICET) 50-325-40 MG tablet; Take 1 tablet by mouth daily as needed.  Prediabetes Continue a healthful diet for diabetes prevention. We will plan on re-checking HGA1C next visit.  Mixed hyperlipidemia LDL goal < 100, ideally < 70. Continue Atorvastatin 40 mg daily and low fat diet.  Essential hypertension BP adequately controlled. Continue monitoring BP at home. Continue metoprolol succinate 25 mg daily and amlodipine 5 mg daily as well as Clonidine 0.1 mg daily as needed if SBP > 160. Low salt diet. Eye exam is current.  -     cloNIDine (CATAPRES) 0.1 MG tablet; Take 1 tablet (0.1 mg total) by mouth daily as needed (for BP over 160/90).  Cervicalgia Chronic. She is not sure if Gabapentin is helping, some side effects discussed. Following with neurologist.  Paroxysmal atrial fibrillation ([place]) We discussed Dx,prognosis,and treatment. Today HR mildly irregular,bigeminus pattern. We discussed purpose of anticoagulation. She was on coumadin when she had her CVA in 07/2021. Continue following with cardiologist.  I spent a total of 48 minutes in both face to face and non face to face activities for this visit on the date of this encounter. During this time history was obtained and documented, examination was performed, prior labs/imaging reviewed, and  assessment/plan discussed.  Return in about 4 months (around 04/08/2022) for cpe.  Sybrina Laning G. [place],  MD  [facility]. [place] office.

## 2021-12-09 ENCOUNTER — Encounter: Payer: Self-pay | Admitting: Family Medicine

## 2021-12-09 ENCOUNTER — Ambulatory Visit (INDEPENDENT_AMBULATORY_CARE_PROVIDER_SITE_OTHER): Payer: PPO | Admitting: Family Medicine

## 2021-12-09 VITALS — BP 124/70 | HR 76 | Resp 16 | Ht 61.0 in | Wt 135.1 lb

## 2021-12-09 DIAGNOSIS — R7303 Prediabetes: Secondary | ICD-10-CM

## 2021-12-09 DIAGNOSIS — M542 Cervicalgia: Secondary | ICD-10-CM | POA: Diagnosis not present

## 2021-12-09 DIAGNOSIS — E782 Mixed hyperlipidemia: Secondary | ICD-10-CM | POA: Diagnosis not present

## 2021-12-09 DIAGNOSIS — G43709 Chronic migraine without aura, not intractable, without status migrainosus: Secondary | ICD-10-CM

## 2021-12-09 DIAGNOSIS — I10 Essential (primary) hypertension: Secondary | ICD-10-CM

## 2021-12-09 DIAGNOSIS — I480 Paroxysmal atrial fibrillation: Secondary | ICD-10-CM | POA: Diagnosis not present

## 2021-12-09 MED ORDER — CLONIDINE HCL 0.1 MG PO TABS: 0.1000 mg | ORAL_TABLET | Freq: Every day | ORAL | 0 refills | Status: DC | PRN

## 2021-12-09 MED ORDER — BUTALBITAL-APAP-CAFFEINE 50-325-40 MG PO TABS: 1.0000 | ORAL_TABLET | Freq: Every day | ORAL | 1 refills | Status: DC | PRN

## 2021-12-09 NOTE — Patient Instructions (Addendum)
A few things to remember from today's visit:  Prediabetes  Mixed hyperlipidemia  Essential hypertension  Chronic migraine without aura without status migrainosus, not intractable  Cervicalgia  If you need refills please call your pharmacy. Do not use My Chart to request refills or for acute issues that need immediate attention.   No changes today.  I will plan on checking your cholesterol next visit, so come fasting.  Please be sure medication list is accurate. If a new problem present, please set up appointment sooner than planned today.

## 2021-12-10 NOTE — Progress Notes (Signed)
Cardiology Office Note:    Date:  12/11/2021   ID:  [name] [name], DOB 07/15/1946, MRN [id number]  PCP:  Martin[place], Betty G, MD   [facility] Cardiologist:  [name] Burow, MD      Referring MD: [name] Rail, MD   Presents to the clinic today for evaluation of her elevated blood pressure.  History of Present Illness:    [name] [name] is a 76 y.o. female with a hx of essential hypertension, cardiomegaly, CHF, pleural effusion, GERD, gastritis, hyperlipidemia, chronic low back pain, prediabetes, chest pain, and cardiac murmur.   She was  seen by Dr. Gwenlyn [name] for complaints of atypical chest pain.  She had never had a heart attack or stroke.  She was noted to have seen Dr. Sallyanne [name] back in 2011 for atypical chest pain work-up was negative which included nuclear stress test.  She has been out of the the country for several months and had returned to the [place] for about a month.  Since that time she reported daily chest pain.  She had been under a lot of stress.  Her symptomology was felt to be related to GERD.   She was seen by Dr. Gwenlyn [name] on 04/08/2019.  During that time she was doing fairly well.  She noticed increased dyspnea with exertion over the last several months.  She complained of atypical chest pain which was more positional.  She described reproducible pain with pushing on her sternum.  She reported it was similar to pain she had 10 years prior.  At that time she had a negative stress test.  It was not felt to be anginal.  Her blood pressure was well controlled on clonidine losartan and metoprolol.   She presented to the clinic 03/01/21 to review her echocardiogram.  She stated she had noticed increased shortness of breath over the last 2 weeks.  We reviewed her echocardiogram.  Her son presented with her. Case discussed with Dr. Gwenlyn [name].  A left and right heart cath, CBC/BMP, as well as TEE were planned.   Shared decision making was used.  All questions were  answered.  She followed up with Dr. Gwenlyn [name] on 03/20/2021.  During that time her TEE and right/left heart catheterization were reviewed.  She was accompanied by her sister.  Her son [name] [name] is a family medicine physician with [employer].  She was noted to have a flail posterior leaflet, normal LV function and normal coronary anatomy.  She was referred to Dr. Roxy [name] for further management and treatment of her mitral valve.  She underwent minimally invasive mitral valve repair 05/21/2021 with Medtronic ring and also had atrial appendage clipping.  She was discharged home on 05/26/2021.  She recovered well.  She was seen postoperatively by Dr. Roxy [name] and noted fatigue.  She was seen in follow-up by Dr. Gwenlyn [name] on 09/27/2021.  During that time her breathing had significantly improved compared to her preoperative symptoms.  Her echocardiogram was reviewed which showed a decline in her ejection fraction from 50-55 down to 40-45.  No mitral valve regurgitation was noted.  Plan for follow-up echocardiogram in 6 months was discussed.  She contacted the nurse triage line and reported that she was having episodes of elevated blood pressure in the evenings.  She presented to the clinic 10/28/2021 for evaluation and stated over the last several days she had noted increased blood pressures.  She brought a blood pressure log  that showed blood pressures in the 720N-470J systolic.  She had  been taking some clonidine that was from 2019.  We reviewed her medication list and her diet.  She reported that she did use some salt in her cooking.  She had been taking Tylenol as needed for pain.  She also reported that she was previously on losartan but was not taking it currently.  She reported her husband was out of the country getting dental work.  I  started her on amlodipine 5 mg daily, asked her to maintain a blood pressure log to bring to clinic, salty 6 diet sheet, and refilled her atorvastatin.  We planned follow-up for 1 to 2 months.  She  presents the clinic today for follow-up evaluation and states she feels well.  She continues to have some fatigue.  She brings a blood pressure log which shows improved blood pressures.  They range from the 245-809 systolic range with the occasional blood pressure 170s.  Her heart rate using her electric monitor has been ranging 40s.  On review of the clinic her pulse is in the low to mid 70s.  We reviewed the importance of low-sodium diet, limiting caffeine, and regular exercise.  She presents with her husband who states that half of his dental work is been completed and he will return to have the other half done in the future.  They plan to travel to [place] in February.  I will increase her amlodipine with an addition of half tablet 2.5 mg in the evening and continue her 5 mg 1 dose.  We will give the salty 6 diet sheet and plan follow-up as scheduled with Dr. Gwenlyn [name].  Today she denies chest pain,  lower extremity edema, fatigue, palpitations, melena, hematuria, hemoptysis, diaphoresis, weakness, presyncope, syncope, orthopnea, and PND.  Past Medical History:  Diagnosis Date   Arthritis    Atypical chest pain    Diverticulosis    Gastritis 11/08/2007, 04/12/2012   H pylori bx neg 03/2012   GERD (gastroesophageal reflux disease) 11/08/2007, 04/12/2012   Hepatic cyst    Hiatal hernia 11/08/2007, 04/12/2012   History of kidney stones    Hyperlipidemia    Hypertension    IBS (irritable bowel syndrome)    diarrhea predom   Migraine    Mitral regurgitation    Osteoporosis    DEXA 01/21/2012: -2.1 spine and R fem, -1.8 L fem s/p fosamax  2004-2011 DEXA 04/18/14 @ [facility]: -2.5 - rec to start Prolia     PVC's (premature ventricular contractions)    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   Complex valvuloplasty including PTFE neochord placement x6 with 30 mm Medtronic Simuform ring annuloplasty with clipping of LA appendage   Schatzki's ring 11/11/2010   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    Shingles outbreak 04/2013    Past Surgical History:  Procedure Laterality Date   [place] STUDY  03/14/2021   Procedure: BUBBLE STUDY;  Surgeon: Sueanne Margarita, MD;  Location: [facility];  Service: Cardiovascular;;   CARDIAC CATHETERIZATION     CLIPPING OF ATRIAL APPENDAGE N/A 05/21/2021   Procedure: CLIPPING OF ATRIAL APPENDAGE USING ATRICURE 40MM PRO2 CLIP;  Surgeon: Rexene Alberts, MD;  Location: [facility];  Service: Open Heart Surgery;  Laterality: N/A;   COLONOSCOPY     CYSTOCELE REPAIR  2008   ESOPHAGOGASTRODUODENOSCOPY     Maxilofacial  1992   MITRAL VALVE REPAIR  05/21/2021   Procedure: MINIMALLY INVASIVE MITRAL VALVE REPAIR (MVR) USING MEDTRONIC SIMUFORM 30MM RING;  Surgeon: Rexene Alberts, MD;  Location: [facility];  Service: Open Heart Surgery;;   RIGHT/LEFT HEART CATH AND CORONARY ANGIOGRAPHY N/A 03/14/2021   Procedure: RIGHT/LEFT HEART CATH AND CORONARY ANGIOGRAPHY;  Surgeon: Lorretta Harp, MD;  Location: [facility];  Service: Cardiovascular;  Laterality: N/A;   TEE WITHOUT CARDIOVERSION N/A 03/14/2021   Procedure: TRANSESOPHAGEAL ECHOCARDIOGRAM (TEE);  Surgeon: Sueanne Margarita, MD;  Location: [facility] ENDOSCOPY;  Service: Cardiovascular;  Laterality: N/A;   TEE WITHOUT CARDIOVERSION N/A 05/21/2021   Procedure: TRANSESOPHAGEAL ECHOCARDIOGRAM (TEE);  Surgeon: Rexene Alberts, MD;  Location: [facility];  Service: Open Heart Surgery;  Laterality: N/A;   TONSILLECTOMY  1960    Current Medications: Current Meds  Medication Sig   acetaminophen (TYLENOL) 500 MG tablet Take 500 mg by mouth every 4 (four) hours as needed for mild pain.   amLODipine (NORVASC) 5 MG tablet Take 1 tablet (5 mg total) by mouth daily.   aspirin EC 81 MG EC tablet Take 1 tablet (81 mg total) by mouth daily. Swallow whole.   atorvastatin (LIPITOR) 40 MG tablet Take 1 tablet (40 mg total) by mouth daily.   butalbital-acetaminophen-caffeine (FIORICET) 50-325-40 MG tablet Take 1 tablet by  mouth daily as needed.   cloNIDine (CATAPRES) 0.1 MG tablet Take 1 tablet (0.1 mg total) by mouth daily as needed (for BP over 160/90).   dicyclomine (BENTYL) 10 MG capsule Take 1 capsule (10 mg total) by mouth every 8 (eight) hours as needed for spasms.   gabapentin (NEURONTIN) 100 MG capsule Take 1 capsule in morning and 2 capsules at bedtime (Patient taking differently: Patient is taking 200 mg at night)   loperamide (IMODIUM A-D) 2 MG tablet Take 1 tablet (2 mg total) by mouth as needed for diarrhea or loose stools.   metoprolol succinate (TOPROL-XL) 25 MG 24 hr tablet TAKE ONE TABLET BY MOUTH AT BREAKFAST AND AT BEDTIME   Peppermint Oil (IBGARD) 90 MG CPCR Use as needed   warfarin (COUMADIN) 2 MG tablet Take 1-2 tablets Daily or as prescribed by Clinic     Allergies:   Patient has no known allergies.   Social History   Socioeconomic History   Marital status: Married    Spouse name: [name]   Number of children: 3   Years of education: Not on file   Highest education level: Master's degree (e.[name]., MA, MS, MEng, MEd, MSW, MBA)  Occupational History   Occupation: retired  Tobacco Use   Smoking status: Never   Smokeless tobacco: Never  Vaping Use   Vaping Use: Never used  Substance and Sexual Activity   Alcohol use: No   Drug use: No   Sexual activity: Not on file  Other Topics Concern   Not on file  Social History Narrative   Married, lives with spouse. Retired Licensed conveyancer, Print production planner. Has 3 kids (moved to [place] to be closer)- youngest son MD. Dorie Rank to [place] from [place] 06/2010, lived in [place] x 10years   Social Determinants of Health   Financial Resource Strain: Not on file  Food Insecurity: Not on file  Transportation Needs: Not on file  Physical Activity: Not on file  Stress: Not on file  Social Connections: Not on file     Family History: The patient's family history includes AAA (abdominal aortic aneurysm) in her mother; Alzheimer's disease (age of onset: 24) in  her mother; Bone cancer (age of onset: 35) in her brother; Hypertension in her mother; Kidney cancer in her brother; Stroke (age of onset: 4)  in her father. There is no history of Colon cancer.  ROS:   Please see the history of present illness.     All other systems reviewed and are negative.   Risk Assessment/Calculations:           Physical Exam:    VS:  BP 128/66    Pulse 72    Ht 5\' 1"  (1.549 m)    Wt 133 lb 6.4 oz (60.5 kg)    SpO2 96%    BMI 25.21 kg/m     Wt Readings from Last 3 Encounters:  12/11/21 133 lb 6.4 oz (60.5 kg)  12/09/21 135 lb 2 oz (61.3 kg)  10/30/21 133 lb 2 oz (60.4 kg)     GEN:  Well nourished, well developed in no acute distress HEENT: Normal NECK: No JVD; No carotid bruits LYMPHATICS: No lymphadenopathy CARDIAC: RRR, no murmurs, rubs, gallops RESPIRATORY:  Clear to auscultation without rales, wheezing or rhonchi  ABDOMEN: Soft, non-tender, non-distended MUSCULOSKELETAL:  No edema; No deformity  SKIN: Warm and dry NEUROLOGIC:  Alert and oriented x 3 PSYCHIATRIC:  Normal affect    EKGs/Labs/Other Studies Reviewed:    The following studies were reviewed today:  Echocardiogram 02/27/2021 IMPRESSIONS     1. Left ventricular ejection fraction, by estimation, is 55 to 60%. The  left ventricle has normal function. The left ventricle has no regional  wall motion abnormalities. Left ventricular diastolic parameters were  normal.   2. Right ventricular systolic function is normal. The right ventricular  size is normal.   3. Left atrial size was moderately dilated.   4. There appears to be a partially flail posterior mitral valve leaflet  with significant turburlent blood flow in the left atrium with at least  moderate MR and may be severe.The mitral valve is abnormal. Moderate  mitral valve regurgitation. No evidence  of mitral stenosis.   5. The aortic valve is normal in structure. Aortic valve regurgitation is  not visualized. Mild aortic  valve sclerosis is present, with no evidence  of aortic valve stenosis.   6. The inferior vena cava is normal in size with greater than 50%  respiratory variability, suggesting right atrial pressure of 3 mmHg.R   Recommend TEE for further evaluation of the mitral valve.  Echocardiogram 07/03/2021  IMPRESSIONS     1. Left ventricular ejection fraction, by estimation, is 40 to 45%. The  left ventricle has mildly decreased function. The left ventricle  demonstrates global hypokinesis. Left ventricular diastolic function could  not be evaluated.   2. Right ventricular systolic function is normal. The right ventricular  size is normal.   3. The mitral valve has been repaired/replaced. No evidence of mitral  valve regurgitation. No evidence of mitral stenosis. The mean mitral valve  gradient is 3.0 mmHg with average heart rate of 77 bpm. Procedure Date:  05/21/2021.   4. The aortic valve is tricuspid. Aortic valve regurgitation is not  visualized. No aortic stenosis is present.   5. The inferior vena cava is normal in size with greater than 50%  respiratory variability, suggesting right atrial pressure of 3 mmHg.   Comparison(s): Prior images reviewed side by side. The left ventricular  function is worsened.  Cardiac catheterization 03/14/2021 Hemodynamic findings consistent with mitral valve regurgitation.   [name] [name] is a 76 y.o. female      [id number] LOCATION:  FACILITY: [facility]  PHYSICIAN: [name] [name], M.D. 1946-02-24   Diagnostic Dominance: Right Intervention   EKG: None today.  Recent Labs: 02/19/2021: Pro B Natriuretic peptide (BNP) 222.0; TSH 0.92 05/26/2021: Magnesium 2.2 07/29/2021: ALT 14; BUN 18; Creatinine, Ser 0.99; Hemoglobin 13.1; Platelets 243; Potassium 3.6; Sodium 139  Recent Lipid Panel    Component Value Date/Time   CHOL 164 07/30/2021 0104   TRIG 112 07/30/2021 0104   TRIG 177 06/14/2010 0000   HDL 47 07/30/2021 0104   CHOLHDL 3.5 07/30/2021  0104   VLDL 22 07/30/2021 0104   LDLCALC 95 07/30/2021 0104   LDLDIRECT 75.0 11/02/2019 1006    ASSESSMENT & PLAN    Essential hypertension-BP today 128/66.  Much better control at home.  Had been elevated at home prior to addition of amlodipine.   Continue metoprolol Continue amlodipine 5 mg a.m. and at 2.5 milligrams HS Heart healthy low-sodium diet-salty 6 reviewed.   Increase physical activity as tolerated Maintain blood pressure log  Moderate-severe mitral regurgitation-underwent mitral valve repair 05/21/2021 echocardiogram 07/03/2021 showed well-functioning mitral valve with no regurgitation.  Continues with no increased DOE, breathing much improved since surgery. Plan for repeat echocardiogram in 4-5 months. Heart healthy low-sodium diet-salty 6 reviewed Continue to increase physical activity as tolerated  Atypical chest pain-denies chest pain.  Cardiac catheterization showed normal coronary anatomy 03/14/2021 No plans for further ischemic evaluation at this time.  Disposition: Follow-up with Dr. Gwenlyn [name] as scheduled.       Medication Adjustments/Labs and Tests Ordered: Current medicines are reviewed at length with the patient today.  Concerns regarding medicines are outlined above.  No orders of the defined types were placed in this encounter.  No orders of the defined types were placed in this encounter.   There are no Patient Instructions on file for this visit.   Signed, Deberah Pelton, NP  12/11/2021 11:20 AM      Notice: This dictation was prepared with Dragon dictation along with smaller phrase technology. Any transcriptional errors that result from this process are unintentional and may not be corrected upon review.  I spent 13 minutes examining this patient, reviewing medications, and using patient centered shared decision making involving her cardiac care.  Prior to her visit I spent greater than 20 minutes reviewing her past medical history,  medications,  and prior cardiac tests.

## 2021-12-11 ENCOUNTER — Encounter (HOSPITAL_BASED_OUTPATIENT_CLINIC_OR_DEPARTMENT_OTHER): Payer: Self-pay | Admitting: General Practice

## 2021-12-11 ENCOUNTER — Encounter: Payer: Self-pay | Admitting: Internal Medicine

## 2021-12-11 ENCOUNTER — Ambulatory Visit (INDEPENDENT_AMBULATORY_CARE_PROVIDER_SITE_OTHER): Payer: PPO

## 2021-12-11 ENCOUNTER — Other Ambulatory Visit: Payer: Self-pay

## 2021-12-11 ENCOUNTER — Ambulatory Visit (INDEPENDENT_AMBULATORY_CARE_PROVIDER_SITE_OTHER): Payer: PPO | Admitting: General Practice

## 2021-12-11 VITALS — BP 128/66 | HR 72 | Ht 61.0 in | Wt 133.4 lb

## 2021-12-11 DIAGNOSIS — Z7901 Long term (current) use of anticoagulants: Secondary | ICD-10-CM

## 2021-12-11 DIAGNOSIS — R0789 Other chest pain: Secondary | ICD-10-CM

## 2021-12-11 DIAGNOSIS — I340 Nonrheumatic mitral (valve) insufficiency: Secondary | ICD-10-CM | POA: Diagnosis not present

## 2021-12-11 DIAGNOSIS — I10 Essential (primary) hypertension: Secondary | ICD-10-CM

## 2021-12-11 DIAGNOSIS — Z5181 Encounter for therapeutic drug level monitoring: Secondary | ICD-10-CM

## 2021-12-11 DIAGNOSIS — Z9889 Other specified postprocedural states: Secondary | ICD-10-CM

## 2021-12-11 LAB — POCT INR: INR: 1.8 — AB (ref 2.0–3.0)

## 2021-12-11 MED ORDER — AMLODIPINE BESYLATE 2.5 MG PO TABS: 2.5000 mg | ORAL_TABLET | Freq: Every day | ORAL | 3 refills | Status: DC

## 2021-12-11 NOTE — Patient Instructions (Signed)
Medication Instructions:  Your physician has recommended you make the following change in your medication:   Continue:  Amlodipine 5mg  daily in the morning   Start: Amlodipine 2.5mg  daily in the evening   *If you need a refill on your cardiac medications before your next appointment, please call your pharmacy*   Lab Work: None ordered today    Testing/Procedures: None ordered today    Follow-Up: At [facility], you and your health needs are our priority.  As part of our continuing mission to provide you with exceptional heart care, we have created designated Provider Care Teams.  These Care Teams include your primary Cardiologist (physician) and Advanced Practice Providers (APPs -  Physician Assistants and Nurse Practitioners) who all work together to provide you with the care you need, when you need it.  We recommend signing up for the patient portal called "MyChart".  Sign up information is provided on this After Visit Summary.  MyChart is used to connect with patients for Virtual Visits (Telemedicine).  Patients are able to view lab/test results, encounter notes, upcoming appointments, etc.  Non-urgent messages can be sent to your provider as well.   To learn more about what you can do with MyChart, go to [email or web address].    Your next appointment:   Follow up as scheduled     Other Instructions YOU ARE OKAY TO TRAVEL   Please reduce caffeine intake!   Tips to Measure your Blood Pressure Correctly  To determine whether you have hypertension, a medical professional will take a blood pressure reading. How you prepare for the test, the position of your arm, and other factors can change a blood pressure reading by 10% or more. That could be enough to hide high blood pressure, start you on a drug you don't really need, or lead your doctor to incorrectly adjust your medications.  National and international guidelines offer specific instructions for measuring blood  pressure. If a doctor, nurse, or medical assistant isn't doing it right, don't hesitate to ask him or her to get with the guidelines.  Here's what you can do to ensure a correct reading:  Don't drink a caffeinated beverage or smoke during the 30 minutes before the test.  Sit quietly for five minutes before the test begins.  During the measurement, sit in a chair with your feet on the floor and your arm supported so your elbow is at about heart level.  The inflatable part of the cuff should completely cover at least 80% of your upper arm, and the cuff should be placed on bare skin, not over a shirt.  Don't talk during the measurement.  Have your blood pressure measured twice, with a brief break in between. If the readings are different by 5 points or more, have it done a third time.  In 2017, new guidelines from the [place], the [employer] of Cardiology, and nine other health organizations lowered the diagnosis of high blood pressure to 130/80 mm Hg or higher for all adults. The guidelines also redefined the various blood pressure categories to now include normal, elevated, Stage 1 hypertension, Stage 2 hypertension, and hypertensive crisis (see "Blood pressure categories").  Blood pressure categories  Blood pressure category SYSTOLIC (upper number)  DIASTOLIC (lower number)  Normal Less than 120 mm Hg and Less than 80 mm Hg  Elevated 120-129 mm Hg and Less than 80 mm Hg  High blood pressure: Stage 1 hypertension 130-139 mm Hg or 80-89 mm Hg  High blood  pressure: Stage 2 hypertension 140 mm Hg or higher or 90 mm Hg or higher  Hypertensive crisis (consult your doctor immediately) Higher than 180 mm Hg and/or Higher than 120 mm Hg  Source: American Heart Association and American Stroke Association. For more on getting your blood pressure under control, buy Controlling Your Blood Pressure, a Special Health Report from [facility].   Blood Pressure  Log   Date   Time  Blood Pressure  Position  Example: Nov 1 9 AM 124/78 sitting

## 2021-12-11 NOTE — Patient Instructions (Signed)
TAKE 2 tablets tonight only and then continue 1 tablet Daily, except 1.5 tablets on Monday and Friday.  INR in 3 weeks. [phone]

## 2021-12-31 ENCOUNTER — Ambulatory Visit (INDEPENDENT_AMBULATORY_CARE_PROVIDER_SITE_OTHER): Payer: PPO | Admitting: Cardiovascular Disease

## 2021-12-31 ENCOUNTER — Encounter: Payer: Self-pay | Admitting: Cardiovascular Disease

## 2021-12-31 ENCOUNTER — Ambulatory Visit (INDEPENDENT_AMBULATORY_CARE_PROVIDER_SITE_OTHER): Payer: PPO | Admitting: *Deleted

## 2021-12-31 ENCOUNTER — Other Ambulatory Visit: Payer: Self-pay

## 2021-12-31 DIAGNOSIS — Z9889 Other specified postprocedural states: Secondary | ICD-10-CM | POA: Diagnosis not present

## 2021-12-31 DIAGNOSIS — I340 Nonrheumatic mitral (valve) insufficiency: Secondary | ICD-10-CM

## 2021-12-31 DIAGNOSIS — I10 Essential (primary) hypertension: Secondary | ICD-10-CM | POA: Diagnosis not present

## 2021-12-31 DIAGNOSIS — Z5181 Encounter for therapeutic drug level monitoring: Secondary | ICD-10-CM

## 2021-12-31 DIAGNOSIS — I480 Paroxysmal atrial fibrillation: Secondary | ICD-10-CM | POA: Diagnosis not present

## 2021-12-31 LAB — POCT INR: INR: 2 (ref 2.0–3.0)

## 2021-12-31 NOTE — Assessment & Plan Note (Signed)
History of essential hypertension a blood pressure measured today at 130/63.  She is on amlodipine, clonidine and metoprolol.

## 2021-12-31 NOTE — Assessment & Plan Note (Signed)
History of PAF in the [place].  On amiodarone briefly which was discontinued.  She was on warfarin anticoagulation.  My intent was to discontinue this however she had an occipital stroke with diplopia and remains on Coumadin.  I did do a Zio patch on her that did not reveal any A-fib but did reveal some SVT and nonsustained VT on 10/23/2021.

## 2021-12-31 NOTE — Patient Instructions (Addendum)
Description   Continue taking 1 tablet daily except 1.5 tablets on Monday, Wednesday and Friday.  INR in 3 weeks. [phone]

## 2021-12-31 NOTE — Assessment & Plan Note (Signed)
History of hyperlipidemia on statin therapy with lipid profile performed 07/30/2021 revealing total cholesterol 164, LDL of 95 and HDL 47.

## 2021-12-31 NOTE — Progress Notes (Signed)
12/31/2021 [name]   1946/01/19  [id number]  Primary Physician [place], Betty G, MD Primary Cardiologist: Lorretta Harp MD Garret Reddish, [place], [place]  HPI:  [name] [name] is a 76 y.o.  [name] [name] is a  12 -year-old married Caucasian female mother of 3 children, grandmother to 2 grandchildren, patient Dr. Billey Gosling who saw [name] [name] remotely.  She is accompanied by her husband [name] [name] today.  She was referred for atypical chest pain.  I last saw her in the office 09/27/2021.Marland Kitchen  Her cardiac risk factor profile is notable for treated hypertension and mild hyperlipidemia. She has never had a heart attack or stroke. She is otherwise healthy except for GERD. She doesn't saw [name] [name] back in 2011 for atypical chest pain and workup was negative including a Myoview stress test. She was recently out of the country for several months and returned one month ago. Since that time she's had daily chest pain. She was under a lot of stress and she was away the pain itself sounds like GERD, begin subxiphoid and has no other characteristic symptoms of angina.   Since I saw her 2 years ago she is complained of increasing dyspnea on exertion.  She did have a negative Myoview stress test.  Recent 2D echo revealed severe MR.     She underwent right and left heart cath by myself 03/14/2021 revealing normal coronary arteries with a high V wave.  She also underwent TEE at the same time revealing normal LV function with a flail posterior leaflet and severe MR.  She saw Dr. Roxy Manns at my request and underwent minimally invasive mitral valve repair on 05/21/2021 with a 38 mm Medtronic SIMUFORM  ring as well as left atrial appendage clipping.  She was discharged home on 05/26/2021.  She is recuperated nicely.  She was seen for postop visit at Dr. Guy Sandifer  office and was complaining of some "fatigue".  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  Her breathing  has significantly improved compared to her symptoms preoperatively.  Her 2D echocardiogram performed 07/03/2021 showed a decline in her EF from 50 to 55% down to 40 to 45% although her MR has resolved.  Since I saw her 3 months ago she continues to improve although still complains of fatigue.  She remains on Coumadin anticoagulation because of her occipital stroke.  She and her husband are leaving to menstruate on a 10-day vacation this coming week.   Current Meds  Medication Sig   acetaminophen (TYLENOL) 500 MG tablet Take 500 mg by mouth every 4 (four) hours as needed for mild pain.   amLODipine (NORVASC) 2.5 MG tablet Take 1 tablet (2.5 mg total) by mouth daily. Take 1 tablet in the evening   amLODipine (NORVASC) 5 MG tablet Take 1 tablet (5 mg total) by mouth daily.   aspirin EC 81 MG EC tablet Take 1 tablet (81 mg total) by mouth daily. Swallow whole.   atorvastatin (LIPITOR) 40 MG tablet Take 1 tablet (40 mg total) by mouth daily.   butalbital-acetaminophen-caffeine (FIORICET) 50-325-40 MG tablet Take 1 tablet by mouth daily as needed.   cloNIDine (CATAPRES) 0.1 MG tablet Take 1 tablet (0.1 mg total) by mouth daily as needed (for BP over 160/90).   dicyclomine (BENTYL) 10 MG capsule Take 1 capsule (10 mg total) by mouth every 8 (eight) hours as needed for spasms.   gabapentin (NEURONTIN) 100 MG  capsule Take 1 capsule in morning and 2 capsules at bedtime (Patient taking differently: Patient is taking 200 mg at night)   loperamide (IMODIUM A-D) 2 MG tablet Take 1 tablet (2 mg total) by mouth as needed for diarrhea or loose stools.   metoprolol succinate (TOPROL-XL) 25 MG 24 hr tablet TAKE ONE TABLET BY MOUTH AT BREAKFAST AND AT BEDTIME   Peppermint Oil (IBGARD) 90 MG CPCR Use as needed   warfarin (COUMADIN) 2 MG tablet Take 1-2 tablets Daily or as prescribed by Clinic     No Known Allergies  Social History   Socioeconomic History   Marital status: Married    Spouse name: [name] [name]    Number of children: 3   Years of education: Not on file   Highest education level: Master's degree (e.g., MA, [name], MEng, MEd, MSW, MBA)  Occupational History   Occupation: retired  Tobacco Use   Smoking status: Never   Smokeless tobacco: Never  Vaping Use   Vaping Use: Never used  Substance and Sexual Activity   Alcohol use: No   Drug use: No   Sexual activity: Not on file  Other Topics Concern   Not on file  Social History Narrative   Married, lives with spouse. Retired Licensed conveyancer, Print production planner. Has 3 kids (moved to [place] to be closer)- youngest son MD. [name] [name] to [name] [name] from [place] 06/2010, lived in [place] x 10years   Social Determinants of Health   Financial Resource Strain: Not on file  Food Insecurity: Not on file  Transportation Needs: Not on file  Physical Activity: Not on file  Stress: Not on file  Social Connections: Not on file  Intimate Partner Violence: Not on file     Review of Systems: General: negative for chills, fever, night sweats or weight changes.  Cardiovascular: negative for chest pain, dyspnea on exertion, edema, orthopnea, palpitations, paroxysmal nocturnal dyspnea or shortness of breath Dermatological: negative for rash Respiratory: negative for cough or wheezing Urologic: negative for hematuria Abdominal: negative for nausea, vomiting, diarrhea, bright red blood per rectum, melena, or hematemesis Neurologic: negative for visual changes, syncope, or dizziness All other systems reviewed and are otherwise negative except as noted above.    Blood pressure 130/63, pulse (!) 43, height 5\' 3"  (1.6 m), weight 135 lb (61.2 kg), SpO2 97 %.  General appearance: alert and no distress Neck: no adenopathy, no carotid bruit, no JVD, supple, symmetrical, trachea midline, and thyroid not enlarged, symmetric, no tenderness/mass/nodules Lungs: clear to auscultation bilaterally Heart: regular rate and rhythm, S1, S2 normal, no murmur, click, rub or gallop Extremities:  extremities normal, atraumatic, no cyanosis or edema Pulses: 2+ and symmetric Skin: Skin color, texture, turgor normal. No rashes or lesions Neurologic: Grossly normal  EKG sinus rhythm 83 with atrial bigeminy.  I personally reviewed this EKG.  ASSESSMENT AND PLAN:   Essential hypertension History of essential hypertension a blood pressure measured today at 130/63.  She is on amlodipine, clonidine and metoprolol.  S/P minimally-invasive mitral valve repair History of severe mitral regurgitation status post right left heart cath by myself 03/14/2021 revealing normal coronary arteries with high V wave.  Transesophageal echo at the same time revealed a flail posterior leaflet.  She underwent minimally invasive mitral valve repair by Dr. Roxy Manns 05/13/2021 with a 38 mm Medtronic SIMUFORM  ring as well as and a left atrial appendage clipping.  She was discharged home on 05/26/2021 and was recuperated nicely.  A 2D echocardiogram performed 07/03/2021 showed a decline in  her EF from 50 to 55% down to 40 to 45% although her MR has resolved.  She feels clinically improved although she is still somewhat fatigued.  We will repeat a 2D echo in August of this year.  Paroxysmal atrial fibrillation (HCC) History of PAF in the [place].  On amiodarone briefly which was discontinued.  She was on warfarin anticoagulation.  My intent was to discontinue this however she had an occipital stroke with diplopia and remains on Coumadin.  I did do a Zio patch on her that did not reveal any A-fib but did reveal some SVT and nonsustained VT on 10/23/2021.  Hyperlipidemia History of hyperlipidemia on statin therapy with lipid profile performed 07/30/2021 revealing total cholesterol 164, LDL of 95 and HDL 47.     Lorretta Harp MD [facility], [facility] 12/31/2021 9:56 AM

## 2021-12-31 NOTE — Assessment & Plan Note (Signed)
History of severe mitral regurgitation status post right left heart cath by myself 03/14/2021 revealing normal coronary arteries with high V wave.  Transesophageal echo at the same time revealed a flail posterior leaflet.  She underwent minimally invasive mitral valve repair by Dr. Roxy Manns 05/13/2021 with a 38 mm Medtronic SIMUFORM  ring as well as and a left atrial appendage clipping.  She was discharged home on 05/26/2021 and was recuperated nicely.  A 2D echocardiogram performed 07/03/2021 showed a decline in her EF from 50 to 55% down to 40 to 45% although her MR has resolved.  She feels clinically improved although she is still somewhat fatigued.  We will repeat a 2D echo in August of this year.

## 2021-12-31 NOTE — Patient Instructions (Signed)
Medication Instructions:  Your physician recommends that you continue on your current medications as directed. Please refer to the Current Medication list given to you today.  *If you need a refill on your cardiac medications before your next appointment, please call your pharmacy*   Testing/Procedures: Your physician has requested that you have an echocardiogram. Echocardiography is a painless test that uses sound waves to create images of your heart. It provides your doctor with information about the size and shape of your heart and how well your hearts chambers and valves are working. This procedure takes approximately one hour. There are no restrictions for this procedure. Reschedule for Aug. This procedure is done at [address]   Follow-Up: At [facility], you and your health needs are our priority.  As part of our continuing mission to provide you with exceptional heart care, we have created designated Provider Care Teams.  These Care Teams include your primary Cardiologist (physician) and Advanced Practice Providers (APPs -  Physician Assistants and Nurse Practitioners) who all work together to provide you with the care you need, when you need it.  We recommend signing up for the patient portal called "MyChart".  Sign up information is provided on this After Visit Summary.  MyChart is used to connect with patients for Virtual Visits (Telemedicine).  Patients are able to view lab/test results, encounter notes, upcoming appointments, etc.  Non-urgent messages can be sent to your provider as well.   To learn more about what you can do with MyChart, go to [email or web address].    Your next appointment:   6 month(s)  The format for your next appointment:   In Person  Provider:   Quay Burow, MD

## 2022-01-01 NOTE — Telephone Encounter (Signed)
2023 VOB initiated for [facility]

## 2022-01-21 ENCOUNTER — Other Ambulatory Visit: Payer: Self-pay

## 2022-01-21 ENCOUNTER — Ambulatory Visit (INDEPENDENT_AMBULATORY_CARE_PROVIDER_SITE_OTHER): Payer: PPO | Admitting: *Deleted

## 2022-01-21 DIAGNOSIS — I340 Nonrheumatic mitral (valve) insufficiency: Secondary | ICD-10-CM

## 2022-01-21 DIAGNOSIS — Z5181 Encounter for therapeutic drug level monitoring: Secondary | ICD-10-CM

## 2022-01-21 LAB — POCT INR: INR: 1.8 — AB (ref 2.0–3.0)

## 2022-01-21 NOTE — Patient Instructions (Signed)
Description    Take 1.5 tablets of warfarin today Then START taking 1.5 tablets daily except for 1 tablet on Tuesday, Thursday and Saturday. Recheck INR in 2 weeks. Coumadin Clinic [phone]

## 2022-01-21 NOTE — Telephone Encounter (Signed)
Pt ready for scheduling on or after 01/29/22  Out-of-pocket cost due at time of visit: $301  Primary: [employer] Adv Prolia co-insurance: 20% (approximately $276) Admin fee co-insurance: 20% (approximately $25)  Secondary: n/a Prolia co-insurance:  Admin fee co-insurance:   Deductible: does not apply  Prior Auth: not required PA# Valid:   ** This summary of benefits is an estimation of the patient's out-of-pocket cost. Exact cost may vary based on individual plan coverage.

## 2022-01-30 DIAGNOSIS — M5412 Radiculopathy, cervical region: Secondary | ICD-10-CM | POA: Diagnosis not present

## 2022-01-30 DIAGNOSIS — M4310 Spondylolisthesis, site unspecified: Secondary | ICD-10-CM | POA: Diagnosis not present

## 2022-01-31 DIAGNOSIS — H00021 Hordeolum internum right upper eyelid: Secondary | ICD-10-CM | POA: Diagnosis not present

## 2022-02-04 ENCOUNTER — Other Ambulatory Visit: Payer: Self-pay

## 2022-02-04 ENCOUNTER — Ambulatory Visit (INDEPENDENT_AMBULATORY_CARE_PROVIDER_SITE_OTHER): Payer: PPO | Admitting: *Deleted

## 2022-02-04 DIAGNOSIS — I340 Nonrheumatic mitral (valve) insufficiency: Secondary | ICD-10-CM

## 2022-02-04 DIAGNOSIS — Z5181 Encounter for therapeutic drug level monitoring: Secondary | ICD-10-CM

## 2022-02-04 LAB — POCT INR: INR: 2.2 (ref 2.0–3.0)

## 2022-02-04 NOTE — Patient Instructions (Signed)
Description   ?Continue taking 1.5 tablets daily except for 1 tablet on Tuesday, Thursday and Saturday. Recheck INR in 1 week. ( Pt completing doxy on 3/19) Coumadin Clinic [phone] ?  ? ? ?

## 2022-02-06 ENCOUNTER — Ambulatory Visit (INDEPENDENT_AMBULATORY_CARE_PROVIDER_SITE_OTHER): Payer: PPO

## 2022-02-06 ENCOUNTER — Telehealth: Payer: Self-pay | Admitting: Family Medicine

## 2022-02-06 VITALS — BP 118/62 | Temp 98.2°F | Ht 63.0 in | Wt 135.9 lb

## 2022-02-06 DIAGNOSIS — E2839 Other primary ovarian failure: Secondary | ICD-10-CM | POA: Diagnosis not present

## 2022-02-06 NOTE — Patient Instructions (Addendum)
?[name] [name] , ?Thank you for taking time to come for your Medicare Wellness Visit. I appreciate your ongoing commitment to your health goals. Please review the following plan we discussed and let me know if I can assist you in the future.  ? ?These are the goals we discussed: ? Goals   ? ?   Increase physical activity (pt-stated)   ?   Make better use of time. ?  ?   Manage My Medicine   ?   Timeframe:  Long-Range Goal ?Priority:  High ?Start Date:       06/18/21                      ?Expected End Date:     12/19/21                 ? ?Follow Up Date Oct 2022 ?  ?- call for medicine refill 2 or 3 days before it runs out ?- call if I am sick and can't take my medicine ?- keep a list of all the medicines I take; vitamins and herbals too ?- use a pillbox to sort medicine  ?-Stop taking losartan (as advised at CT surgery follow up) ?-Follow up with PCP for Prolia scheduling ?-Ensure consistent Vitamin K intake (leafy greens, broccoli) ?  ?Why is this important?   ?These steps will help you keep on track with your medicines. ?  ?Notes:  ?  ? ?  ?  ?This is a list of the screening recommended for you and due dates:  ?Health Maintenance  ?Topic Date Due  ? Flu Shot  02/21/2022*  ? COVID-19 Vaccine (3 - Booster for Pfizer series) 02/22/2022*  ? Zoster (Shingles) Vaccine (1 of 2) 11/28/2022*  ? DEXA scan (bone density measurement)  02/07/2023*  ? Tetanus Vaccine  02/07/2023*  ? Pneumonia Vaccine  Completed  ? Hepatitis C Screening: USPSTF Recommendation to screen - Ages 74-79 yo.  Completed  ? HPV Vaccine  Aged Out  ? Colon Cancer Screening  Discontinued  ?*Topic was postponed. The date shown is not the original due date.  ?  ? ?Advanced directives: Yes Patient will bring copy ? ?Conditions/risks identified: None ? ?Next appointment: Follow up in one year for your annual wellness visit  ? ? ?Preventive Care 65 Years and Older, Female ?Preventive care refers to lifestyle choices and visits with your health care provider that  can promote health and wellness. ?What does preventive care include? ?A yearly physical exam. This is also called an annual well check. ?Dental exams once or twice a year. ?Routine eye exams. Ask your health care provider how often you should have your eyes checked. ?Personal lifestyle choices, including: ?Daily care of your teeth and gums. ?Regular physical activity. ?Eating a healthy diet. ?Avoiding tobacco and drug use. ?Limiting alcohol use. ?Practicing safe sex. ?Taking low-dose aspirin every day. ?Taking vitamin and mineral supplements as recommended by your health care provider. ?What happens during an annual well check? ?The services and screenings done by your health care provider during your annual well check will depend on your age, overall health, lifestyle risk factors, and family history of disease. ?Counseling  ?Your health care provider may ask you questions about your: ?Alcohol use. ?Tobacco use. ?Drug use. ?Emotional well-being. ?Home and relationship well-being. ?Sexual activity. ?Eating habits. ?History of falls. ?Memory and ability to understand (cognition). ?Work and work Statistician. ?Reproductive health. ?Screening  ?You may have the following tests or measurements: ?Height,  weight, and BMI. ?Blood pressure. ?Lipid and cholesterol levels. These may be checked every 5 years, or more frequently if you are over 59 years old. ?Skin check. ?Lung cancer screening. You may have this screening every year starting at age 70 if you have a 30-pack-year history of smoking and currently smoke or have quit within the past 15 years. ?Fecal occult blood test (FOBT) of the stool. You may have this test every year starting at age 39. ?Flexible sigmoidoscopy or colonoscopy. You may have a sigmoidoscopy every 5 years or a colonoscopy every 10 years starting at age 56. ?Hepatitis C blood test. ?Hepatitis B blood test. ?Sexually transmitted disease (STD) testing. ?Diabetes screening. This is done by checking your  blood sugar (glucose) after you have not eaten for a while (fasting). You may have this done every 1-3 years. ?Bone density scan. This is done to screen for osteoporosis. You may have this done starting at age 2. ?Mammogram. This may be done every 1-2 years. Talk to your health care provider about how often you should have regular mammograms. ?Talk with your health care provider about your test results, treatment options, and if necessary, the need for more tests. ?Vaccines  ?Your health care provider may recommend certain vaccines, such as: ?Influenza vaccine. This is recommended every year. ?Tetanus, diphtheria, and acellular pertussis (Tdap, Td) vaccine. You may need a Td booster every 10 years. ?Zoster vaccine. You may need this after age 10. ?Pneumococcal 13-valent conjugate (PCV13) vaccine. One dose is recommended after age 59. ?Pneumococcal polysaccharide (PPSV23) vaccine. One dose is recommended after age 23. ?Talk to your health care provider about which screenings and vaccines you need and how often you need them. ?This information is not intended to replace advice given to you by your health care provider. Make sure you discuss any questions you have with your health care provider. ?Document Released: 12/07/2015 Document Revised: 07/30/2016 Document Reviewed: 09/11/2015 ?Elsevier Interactive Patient Education ? 2017 [place]. ? ?Fall Prevention in the Home ?Falls can cause injuries. They can happen to people of all ages. There are many things you can do to make your home safe and to help prevent falls. ?What can I do on the outside of my home? ?Regularly fix the edges of walkways and driveways and fix any cracks. ?Remove anything that might make you trip as you walk through a door, such as a raised step or threshold. ?Trim any bushes or trees on the path to your home. ?Use bright outdoor lighting. ?Clear any walking paths of anything that might make someone trip, such as rocks or tools. ?Regularly  check to see if handrails are loose or broken. Make sure that both sides of any steps have handrails. ?Any raised decks and porches should have guardrails on the edges. ?Have any leaves, snow, or ice cleared regularly. ?Use sand or salt on walking paths during winter. ?Clean up any spills in your garage right away. This includes oil or grease spills. ?What can I do in the bathroom? ?Use night lights. ?Install grab bars by the toilet and in the tub and shower. Do not use towel bars as grab bars. ?Use non-skid mats or decals in the tub or shower. ?If you need to sit down in the shower, use a plastic, non-slip stool. ?Keep the floor dry. Clean up any water that spills on the floor as soon as it happens. ?Remove soap buildup in the tub or shower regularly. ?Attach bath mats securely with double-sided non-slip rug tape. ?  Do not have throw rugs and other things on the floor that can make you trip. ?What can I do in the bedroom? ?Use night lights. ?Make sure that you have a light by your bed that is easy to reach. ?Do not use any sheets or blankets that are too big for your bed. They should not hang down onto the floor. ?Have a firm chair that has side arms. You can use this for support while you get dressed. ?Do not have throw rugs and other things on the floor that can make you trip. ?What can I do in the kitchen? ?Clean up any spills right away. ?Avoid walking on wet floors. ?Keep items that you use a lot in easy-to-reach places. ?If you need to reach something above you, use a strong step stool that has a grab bar. ?Keep electrical cords out of the way. ?Do not use floor polish or wax that makes floors slippery. If you must use wax, use non-skid floor wax. ?Do not have throw rugs and other things on the floor that can make you trip. ?What can I do with my stairs? ?Do not leave any items on the stairs. ?Make sure that there are handrails on both sides of the stairs and use them. Fix handrails that are broken or loose.  Make sure that handrails are as long as the stairways. ?Check any carpeting to make sure that it is firmly attached to the stairs. Fix any carpet that is loose or worn. ?Avoid having throw rugs at the t

## 2022-02-06 NOTE — Telephone Encounter (Signed)
Patient was getting prolia shots before she transferred to [place] and would like to begin getting them here also. Okay to schedule? ? ? ? ? ? ?Please advise  ?

## 2022-02-06 NOTE — Progress Notes (Signed)
? ?Subjective:  ? [name] [name] is a 76 y.o. female who presents for Medicare Annual (Subsequent) preventive examination. ? ?Review of Systems    ? ? ?   ?Objective:  ?  ?Today's Vitals  ? 02/06/22 1458  ?BP: 118/62  ?Temp: 98.2 ?F (36.8 ?C)  ?TempSrc: Oral  ?SpO2: 95%  ?Weight: 135 lb 14.4 oz (61.6 kg)  ?Height: '5\' 3"'$  (1.6 m)  ? ?Body mass index is 24.07 kg/m?. ? ?Advanced Directives 02/06/2022 10/01/2021 07/29/2021 06/05/2021 05/23/2021 05/21/2021 05/17/2021  ?Does Patient Have a Medical Advance Directive? Yes Yes No;Yes Yes Yes Yes Yes  ?Type of Paramedic of [place];Living will - Living will - [place];Living will [place];Living will [place];Living will  ?Does patient want to make changes to medical advance directive? No - Patient declined - - - No - Patient declined - -  ?Copy of [place] in Chart? No - copy requested - - - No - copy requested No - copy requested -  ?Would patient like information on creating a medical advance directive? - - - - - - -  ? ? ?Current Medications (verified) ?Outpatient Encounter Medications as of 02/06/2022  ?Medication Sig  ? acetaminophen (TYLENOL) 500 MG tablet Take 500 mg by mouth every 4 (four) hours as needed for mild pain.  ? amLODipine (NORVASC) 2.5 MG tablet Take 1 tablet (2.5 mg total) by mouth daily. Take 1 tablet in the evening  ? amLODipine (NORVASC) 5 MG tablet Take 1 tablet (5 mg total) by mouth daily.  ? aspirin EC 81 MG EC tablet Take 1 tablet (81 mg total) by mouth daily. Swallow whole.  ? atorvastatin (LIPITOR) 40 MG tablet Take 1 tablet (40 mg total) by mouth daily.  ? butalbital-acetaminophen-caffeine (FIORICET) 50-325-40 MG tablet Take 1 tablet by mouth daily as needed.  ? cloNIDine (CATAPRES) 0.1 MG tablet Take 1 tablet (0.1 mg total) by mouth daily as needed (for BP over 160/90).  ? dicyclomine (BENTYL) 10 MG capsule Take 1 capsule (10 mg total) by mouth  every 8 (eight) hours as needed for spasms.  ? gabapentin (NEURONTIN) 100 MG capsule Take 1 capsule in morning and 2 capsules at bedtime (Patient taking differently: Patient is taking 200 mg at night)  ? loperamide (IMODIUM A-D) 2 MG tablet Take 1 tablet (2 mg total) by mouth as needed for diarrhea or loose stools.  ? metoprolol succinate (TOPROL-XL) 25 MG 24 hr tablet TAKE ONE TABLET BY MOUTH AT BREAKFAST AND AT BEDTIME  ? Peppermint Oil (IBGARD) 90 MG CPCR Use as needed  ? warfarin (COUMADIN) 2 MG tablet Take 1-2 tablets Daily or as prescribed by Clinic  ? ?No facility-administered encounter medications on file as of 02/06/2022.  ? ? ?Allergies (verified) ?Patient has no known allergies.  ? ?History: ?Past Medical History:  ?Diagnosis Date  ? Arthritis   ? Atypical chest pain   ? Diverticulosis   ? Gastritis 11/08/2007, 04/12/2012  ? H pylori bx neg 03/2012  ? GERD (gastroesophageal reflux disease) 11/08/2007, 04/12/2012  ? Hepatic cyst   ? Hiatal hernia 11/08/2007, 04/12/2012  ? History of kidney stones   ? Hyperlipidemia   ? Hypertension   ? IBS (irritable bowel syndrome)   ? diarrhea predom  ? Migraine   ? Mitral regurgitation   ? Osteoporosis   ? DEXA 01/21/2012: -2.1 spine and R fem, -1.8 L fem s/p fosamax  2004-2011 DEXA 04/18/14 @  [facility]: -2.5 - rec to start Prolia    ? PVC's (premature ventricular contractions)   ? 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 Complex valvuloplasty including PTFE neochord placement x6 with 30 mm Medtronic Simuform ring annuloplasty with clipping of LA appendage  ? Schatzki's ring 11/11/2010  ? 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  ? Shingles outbreak 04/2013  ? ?Past Surgical History:  ?Procedure Laterality Date  ? APPENDECTOMY  1958  ? BUBBLE STUDY  03/14/2021  ? Procedure: BUBBLE STUDY;  Surgeon: Sueanne Margarita, MD;  Location: [facility];  Service: Cardiovascular;;  ? CARDIAC CATHETERIZATION    ? CLIPPING OF ATRIAL APPENDAGE N/A 05/21/2021  ? Procedure: CLIPPING OF  ATRIAL APPENDAGE USING ATRICURE 40MM PRO2 CLIP;  Surgeon: Rexene Alberts, MD;  Location: [facility];  Service: Open Heart Surgery;  Laterality: N/A;  ? COLONOSCOPY    ? CYSTOCELE REPAIR  2008  ? ESOPHAGOGASTRODUODENOSCOPY    ? Maxilofacial  1992  ? MITRAL VALVE REPAIR  05/21/2021  ? Procedure: MINIMALLY INVASIVE MITRAL VALVE REPAIR (MVR) USING MEDTRONIC SIMUFORM 30MM RING;  Surgeon: Rexene Alberts, MD;  Location: [facility];  Service: Open Heart Surgery;;  ? RIGHT/LEFT HEART CATH AND CORONARY ANGIOGRAPHY N/A 03/14/2021  ? Procedure: RIGHT/LEFT HEART CATH AND CORONARY ANGIOGRAPHY;  Surgeon: Lorretta Harp, MD;  Location: [facility];  Service: Cardiovascular;  Laterality: N/A;  ? TEE WITHOUT CARDIOVERSION N/A 03/14/2021  ? Procedure: TRANSESOPHAGEAL ECHOCARDIOGRAM (TEE);  Surgeon: Sueanne Margarita, MD;  Location: [facility];  Service: Cardiovascular;  Laterality: N/A;  ? TEE WITHOUT CARDIOVERSION N/A 05/21/2021  ? Procedure: TRANSESOPHAGEAL ECHOCARDIOGRAM (TEE);  Surgeon: Rexene Alberts, MD;  Location: [facility];  Service: Open Heart Surgery;  Laterality: N/A;  ? TONSILLECTOMY  1960  ? ?Family History  ?Problem Relation Age of Onset  ? Hypertension Mother   ? Alzheimer's disease Mother 76  ? AAA (abdominal aortic aneurysm) Mother   ? Stroke Father 61  ? Kidney cancer Brother   ?     mets  ? Bone cancer Brother 13  ? Colon cancer Neg Hx   ? ?Social History  ? ?Socioeconomic History  ? Marital status: Married  ?  Spouse name: [name]  ? Number of children: 3  ? Years of education: Not on file  ? Highest education level: Master's degree (e.g., MA, MS, MEng, MEd, MSW, MBA)  ?Occupational History  ? Occupation: retired  ?Tobacco Use  ? Smoking status: Never  ? Smokeless tobacco: Never  ?Vaping Use  ? Vaping Use: Never used  ?Substance and Sexual Activity  ? Alcohol use: No  ? Drug use: No  ? Sexual activity: Not on file  ?Other Topics Concern  ? Not on file  ?Social History Narrative  ? Married, lives with spouse.  Retired Licensed conveyancer, Print production planner. Has 3 kids (moved to [place] to be closer)- youngest son MD. Dorie Rank to [place] from [place] 06/2010, lived in [place] x 10years  ? ?Social Determinants of Health  ? ?Financial Resource Strain: Low Risk   ? Difficulty of Paying Living Expenses: Not hard at all  ?Food Insecurity: No Food Insecurity  ? Worried About Charity fundraiser in the Last Year: Never true  ? Ran Out of Food in the Last Year: Never true  ?Transportation Needs: No Transportation Needs  ? Lack of Transportation (Medical): No  ? Lack of Transportation (Non-Medical): No  ?Physical Activity: Inactive  ? Days of Exercise per Week: 0 days  ?  Minutes of Exercise per Session: 0 min  ?Stress: No Stress Concern Present  ? Feeling of Stress : Not at all  ?Social Connections: Socially Integrated  ? Frequency of Communication with Friends and Family: More than three times a week  ? Frequency of Social Gatherings with Friends and Family: More than three times a week  ? Attends Religious Services: More than 4 times per year  ? Active Member of Clubs or Organizations: Yes  ? Attends Archivist Meetings: More than 4 times per year  ? Marital Status: Married  ? ? ?Clinical Intake: ? ?Diabetic?  No ? ?Activities of Daily Living ?In your present state of health, do you have any difficulty performing the following activities: 02/06/2022 07/30/2021  ?Hearing? - N  ?Vision? N Y  ?Difficulty concentrating or making decisions? N N  ?Walking or climbing stairs? N Y  ?Dressing or bathing? N N  ?Doing errands, shopping? N N  ?Preparing Food and eating ? N -  ?Using the Toilet? N -  ?In the past six months, have you accidently leaked urine? N -  ?Do you have problems with loss of bowel control? N -  ?Managing your Medications? N -  ?Managing your Finances? N -  ?Housekeeping or managing your Housekeeping? N -  ?Some recent data might be hidden  ? ? ?Patient Care Team: ?Martinique, Betty G, MD as PCP - General (Family Medicine) ?Biagio Borg, MD  as PCP - Family Medicine (Internal Medicine) ?Lorretta Harp, MD as PCP - Cardiology (Cardiology) ?Sable Feil, MD as Consulting Physician (Gastroenterology) ?Lyndal Pulley, DO (Sports Medicine)

## 2022-02-07 NOTE — Telephone Encounter (Signed)
Yes, okay to schedule.

## 2022-02-07 NOTE — Addendum Note (Signed)
Addended by: Criselda Peaches on: 02/07/2022 11:16 AM ? ? Modules accepted: Orders ? ?

## 2022-02-07 NOTE — Telephone Encounter (Signed)
Patient has been scheduled for Monday, March 20th at 10:30! Nothing further needed. ? ? ? ? ? ?

## 2022-02-10 ENCOUNTER — Ambulatory Visit (INDEPENDENT_AMBULATORY_CARE_PROVIDER_SITE_OTHER): Payer: PPO

## 2022-02-10 ENCOUNTER — Other Ambulatory Visit: Payer: Self-pay

## 2022-02-10 DIAGNOSIS — M810 Age-related osteoporosis without current pathological fracture: Secondary | ICD-10-CM

## 2022-02-10 DIAGNOSIS — Z9889 Other specified postprocedural states: Secondary | ICD-10-CM | POA: Diagnosis not present

## 2022-02-10 DIAGNOSIS — Z5181 Encounter for therapeutic drug level monitoring: Secondary | ICD-10-CM

## 2022-02-10 DIAGNOSIS — I340 Nonrheumatic mitral (valve) insufficiency: Secondary | ICD-10-CM

## 2022-02-10 DIAGNOSIS — Z7901 Long term (current) use of anticoagulants: Secondary | ICD-10-CM

## 2022-02-10 LAB — POCT INR: INR: 2.5 (ref 2.0–3.0)

## 2022-02-10 MED ORDER — DENOSUMAB 60 MG/ML ~~LOC~~ SOSY
60.0000 mg | PREFILLED_SYRINGE | Freq: Once | SUBCUTANEOUS | Status: AC
  Administered 2022-02-10: 60 mg via SUBCUTANEOUS

## 2022-02-10 NOTE — Patient Instructions (Signed)
Continue taking 1.5 tablets daily except for 1 tablet on Tuesday, Thursday and Saturday. Recheck INR in 4 weeks.  [facility]madin Clinic [phone] ?

## 2022-02-10 NOTE — Progress Notes (Signed)
[name] [name] is a 76 y.o. female presents to the office today for prolia injections, per Dr. Doug Sou orders. ?Original order: denosumab (PROLIA) injection 60 mg every 6 months ?Prolia '60mg'$  subcutaneous was administered right arm today. Patient tolerated injection. ?Patient due for follow up labs/provider appt: No.  ?Patient next injection due: 08/14/22, appt made Yes ? ?[name] [name]  ?

## 2022-02-12 ENCOUNTER — Ambulatory Visit: Payer: PPO | Admitting: Neurology

## 2022-02-12 ENCOUNTER — Other Ambulatory Visit: Payer: Self-pay | Admitting: Cardiovascular Disease

## 2022-02-13 DIAGNOSIS — M4310 Spondylolisthesis, site unspecified: Secondary | ICD-10-CM | POA: Diagnosis not present

## 2022-02-13 DIAGNOSIS — M5412 Radiculopathy, cervical region: Secondary | ICD-10-CM | POA: Diagnosis not present

## 2022-02-13 DIAGNOSIS — M545 Low back pain, unspecified: Secondary | ICD-10-CM | POA: Diagnosis not present

## 2022-02-13 DIAGNOSIS — M2578 Osteophyte, vertebrae: Secondary | ICD-10-CM | POA: Diagnosis not present

## 2022-02-13 DIAGNOSIS — M7989 Other specified soft tissue disorders: Secondary | ICD-10-CM | POA: Diagnosis not present

## 2022-02-13 DIAGNOSIS — M48061 Spinal stenosis, lumbar region without neurogenic claudication: Secondary | ICD-10-CM | POA: Diagnosis not present

## 2022-02-26 DIAGNOSIS — M4310 Spondylolisthesis, site unspecified: Secondary | ICD-10-CM | POA: Diagnosis not present

## 2022-02-26 DIAGNOSIS — I10 Essential (primary) hypertension: Secondary | ICD-10-CM | POA: Diagnosis not present

## 2022-02-26 DIAGNOSIS — M5412 Radiculopathy, cervical region: Secondary | ICD-10-CM | POA: Diagnosis not present

## 2022-03-02 ENCOUNTER — Other Ambulatory Visit: Payer: Self-pay | Admitting: Cardiovascular Disease

## 2022-03-04 ENCOUNTER — Encounter: Payer: Self-pay | Admitting: Family Medicine

## 2022-03-07 NOTE — Progress Notes (Signed)
? ?ACUTE VISIT ?Chief Complaint  ?Patient presents with  ? ear clogged  ?  Right ear x weeks.  ? ?HPI: ?[name] [name] is a 76 y.o. female with hx of HTN,atrial fib on chronic anticoagulation, IBS, GERD, and headaches here today with above complaint. ?Popping like sound when she presses behind right ear, "crackles." No pain elicited. ? ?Ear Fullness  ?There is pain in the right ear. This is a new problem. The current episode started 1 to 4 weeks ago. The problem occurs constantly. The problem has been unchanged. There has been no fever. The pain is at a severity of 0/10. The patient is experiencing no pain. Associated symptoms include hearing loss and neck pain (Chronic). Pertinent negatives include no abdominal pain, coughing, diarrhea, ear discharge, rash, rhinorrhea, sore throat or vomiting. She has tried nothing for the symptoms. There is no history of a chronic ear infection.  ?Negative for recent URI or new medication. ?In 12/2021 she did fly to [place]. ?She has used Q tips sometimes. ? ?Lower extremities edema, worse at the end of the day. ?No associated pain or erythema. ?CHF: Negative for CP,SOB,orthopnea,PND, foam in urine,gross hematuria,or decreased urine output. ? ?Problem has been stable for a while. ?She has taken Furosemide in the past. ? ?Since her last visit she has seen neurosx, planning on cervical surgery, has not determined date.  ?Cervical and occipital headache, exacerbated by bending neck for a few minutes. Problem has been going on for years. ? ?Review of Systems  ?Constitutional:  Negative for activity change, appetite change, fatigue and fever.  ?HENT:  Positive for hearing loss. Negative for ear discharge, rhinorrhea and sore throat.   ?Respiratory:  Negative for cough and wheezing.   ?Gastrointestinal:  Negative for abdominal pain, diarrhea and vomiting.  ?Musculoskeletal:  Positive for neck pain (Chronic). Negative for gait problem.  ?Skin:  Negative for rash.  ?Neurological:   Negative for syncope and weakness.  ?Rest see pertinent positives and negatives per HPI. ? ?Current Outpatient Medications on File Prior to Visit  ?Medication Sig Dispense Refill  ? acetaminophen (TYLENOL) 500 MG tablet Take 500 mg by mouth every 4 (four) hours as needed for mild pain.    ? amLODipine (NORVASC) 2.5 MG tablet Take 1 tablet (2.5 mg total) by mouth daily. Take 1 tablet in the evening 90 tablet 3  ? aspirin EC 81 MG EC tablet Take 1 tablet (81 mg total) by mouth daily. Swallow whole. 30 tablet 11  ? atorvastatin (LIPITOR) 40 MG tablet Take 1 tablet (40 mg total) by mouth daily. 90 tablet 3  ? butalbital-acetaminophen-caffeine (FIORICET) 50-325-40 MG tablet Take 1 tablet by mouth daily as needed. 10 tablet 1  ? cloNIDine (CATAPRES) 0.1 MG tablet Take 1 tablet (0.1 mg total) by mouth daily as needed (for BP over 160/90). 60 tablet 0  ? dicyclomine (BENTYL) 10 MG capsule Take 1 capsule (10 mg total) by mouth every 8 (eight) hours as needed for spasms. 30 capsule 3  ? gabapentin (NEURONTIN) 100 MG capsule Take 1 capsule in morning and 2 capsules at bedtime (Patient taking differently: Patient is taking 200 mg at night) 90 capsule 5  ? loperamide (IMODIUM A-D) 2 MG tablet Take 1 tablet (2 mg total) by mouth as needed for diarrhea or loose stools. 30 tablet 0  ? metoprolol succinate (TOPROL-XL) 25 MG 24 hr tablet TAKE ONE TABLET BY MOUTH EVERY MORNING and TAKE ONE TABLET BY MOUTH EVERYDAY AT BEDTIME 180 tablet 1  ?  Peppermint Oil (IBGARD) 90 MG CPCR Use as needed    ? amLODipine (NORVASC) 5 MG tablet Take 1 tablet (5 mg total) by mouth daily. 180 tablet 3  ? ?No current facility-administered medications on file prior to visit.  ? ?Past Medical History:  ?Diagnosis Date  ? Arthritis   ? Atypical chest pain   ? Diverticulosis   ? Gastritis 11/08/2007, 04/12/2012  ? H pylori bx neg 03/2012  ? GERD (gastroesophageal reflux disease) 11/08/2007, 04/12/2012  ? Hepatic cyst   ? Hiatal hernia 11/08/2007, 04/12/2012  ?  History of kidney stones   ? Hyperlipidemia   ? Hypertension   ? IBS (irritable bowel syndrome)   ? diarrhea predom  ? Migraine   ? Mitral regurgitation   ? Osteoporosis   ? DEXA 01/21/2012: -2.1 spine and R fem, -1.8 L fem s/p fosamax  2004-2011 DEXA 04/18/14 @ [facility]: -2.5 - rec to start Prolia    ? PVC's (premature ventricular contractions)   ? 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 Complex valvuloplasty including PTFE neochord placement x6 with 30 mm Medtronic Simuform ring annuloplasty with clipping of LA appendage  ? Schatzki's ring 11/11/2010  ? 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  ? Shingles outbreak 04/2013  ? ?No Known Allergies ? ?Social History  ? ?Socioeconomic History  ? Marital status: Married  ?  Spouse name: [name]  ? Number of children: 3  ? Years of education: Not on file  ? Highest education level: Master's degree (e.g., MA, MS, MEng, MEd, MSW, MBA)  ?Occupational History  ? Occupation: retired  ?Tobacco Use  ? Smoking status: Never  ? Smokeless tobacco: Never  ?Vaping Use  ? Vaping Use: Never used  ?Substance and Sexual Activity  ? Alcohol use: No  ? Drug use: No  ? Sexual activity: Not on file  ?Other Topics Concern  ? Not on file  ?Social History Narrative  ? Married, lives with spouse. Retired Licensed conveyancer, Print production planner. Has 3 kids (moved to [place] to be closer)- youngest son MD. Dorie Rank to [place] from [place] 06/2010, lived in [place] x 10years  ? ?Social Determinants of Health  ? ?Financial Resource Strain: Low Risk   ? Difficulty of Paying Living Expenses: Not hard at all  ?Food Insecurity: No Food Insecurity  ? Worried About Charity fundraiser in the Last Year: Never true  ? Ran Out of Food in the Last Year: Never true  ?Transportation Needs: No Transportation Needs  ? Lack of Transportation (Medical): No  ? Lack of Transportation (Non-Medical): No  ?Physical Activity: Inactive  ? Days of Exercise per Week: 0 days  ? Minutes of Exercise per Session: 0 min  ?Stress: No Stress Concern  Present  ? Feeling of Stress : Not at all  ?Social Connections: Socially Integrated  ? Frequency of Communication with Friends and Family: More than three times a week  ? Frequency of Social Gatherings with Friends and Family: More than three times a week  ? Attends Religious Services: More than 4 times per year  ? Active Member of Clubs or Organizations: Yes  ? Attends Archivist Meetings: More than 4 times per year  ? Marital Status: Married  ? ?Vitals:  ? 03/10/22 1021  ?BP: 120/70  ?Pulse: (!) 45  ?Resp: 16  ?SpO2: 97%  ? ?Body mass index is 23.91 kg/m?. ? ?Physical Exam ?Vitals and nursing note reviewed.  ?Constitutional:   ?   General: She is not in acute  distress. ?   Appearance: She is well-developed.  ?HENT:  ?   Head: Normocephalic and atraumatic.  ?   Right Ear: External ear normal.  ?   Left Ear: Tympanic membrane, ear canal and external ear normal.  ?   Ears:  ?   Comments: Cerumen impaction right ear canal, I could not see TM. ?   Mouth/Throat:  ?   Mouth: Mucous membranes are moist.  ?   Pharynx: Oropharynx is clear.  ?Eyes:  ?   Conjunctiva/sclera: Conjunctivae normal.  ?Cardiovascular:  ?   Rate and Rhythm: Normal rate. Rhythm regularly irregular.  ?   Pulses:     ?     Posterior tibial pulses are 2+ on the right side and 2+ on the left side.  ?   Heart sounds: No murmur heard. ?   Comments: Trace pitting LE edema, bilateral. ?HR 80/min. ?Pulmonary:  ?   Effort: Pulmonary effort is normal. No respiratory distress.  ?   Breath sounds: Normal breath sounds.  ?Abdominal:  ?   Palpations: Abdomen is soft. There is no hepatomegaly or mass.  ?   Tenderness: There is no abdominal tenderness.  ?Skin: ?   General: Skin is warm.  ?   Findings: No erythema or rash.  ?Neurological:  ?   General: No focal deficit present.  ?   Mental Status: She is alert and oriented to person, place, and time.  ?   Cranial Nerves: No cranial nerve deficit.  ?   Gait: Gait normal.  ?Psychiatric:  ?   Comments: Well  groomed, good eye contact.  ? ?ASSESSMENT AND PLAN: ? ?[name] [name] was seen today for ear clogged. ? ?Diagnoses and all orders for this visit: ? ?Hearing loss of right ear due to cerumen impaction ?After discuss

## 2022-03-08 IMAGING — MR MR HEAD W/O CM
13 of 14 series · 44 of 48 positions shown · non-contrast
Comparison: CT from 07/29/2021.

CLINICAL DATA: Initial evaluation for neuro deficit, stroke
suspected.

EXAM:
MRI HEAD WITHOUT CONTRAST
TECHNIQUE: Multiplanar, multiecho pulse sequences of the brain and surrounding
structures were obtained without intravenous contrast.

[Series 5: DWI · axial · 3.0mm · 0.88mm/px · z∈[-67,+79]mm · 7 of 100 slices shown (1 of 4)]
[im 1/100]
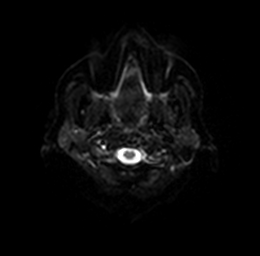
[im 17/100]
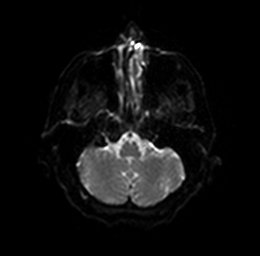
[im 34/100]
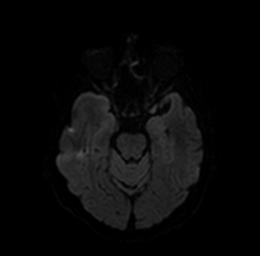
[im 50/100]
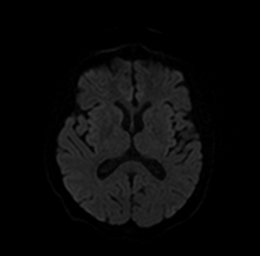
[im 67/100]
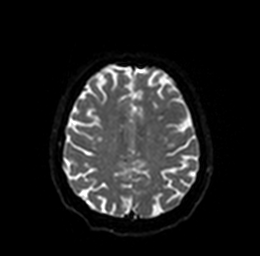
[im 83/100]
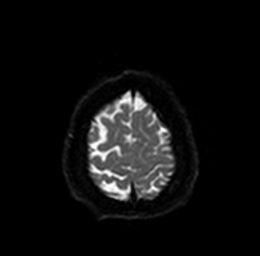
[im 100/100]
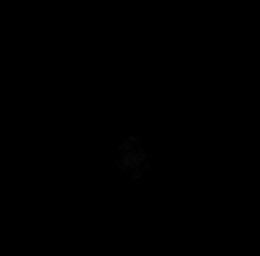

[Series 6: DWI · axial · 3.0mm · 0.88mm/px · z∈[-67,+79]mm · 4 of 49 slices shown (2 of 4)]
[im 1/49]
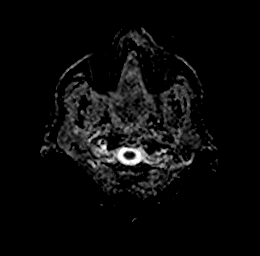
[im 17/49]
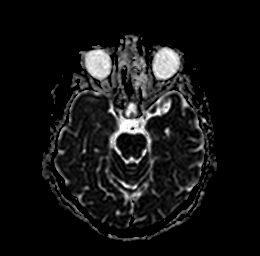
[im 33/49]
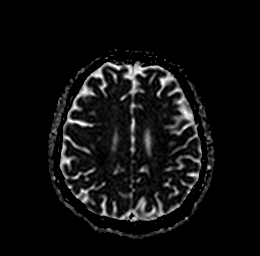
[im 49/49]
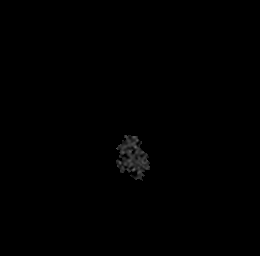

[Series 7: DWI · coronal · 4.0mm · 0.88mm/px · 5 of 68 slices shown (3 of 4)]
[im 1/68]
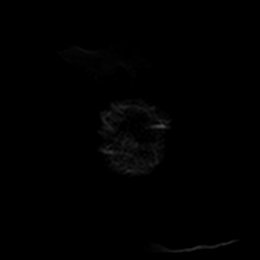
[im 17/68]
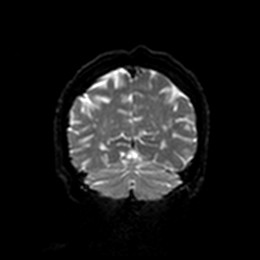
[im 34/68]
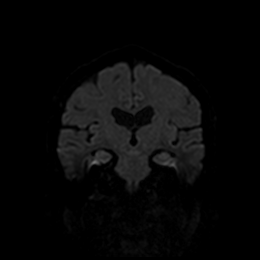
[im 51/68]
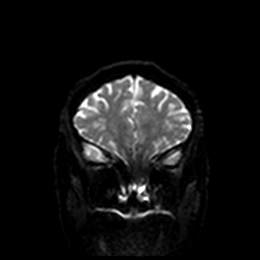
[im 68/68]
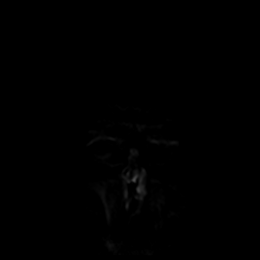

[Series 8: DWI · coronal · 4.0mm · 0.88mm/px · 3 of 34 slices shown (4 of 4)]
[im 1/34]
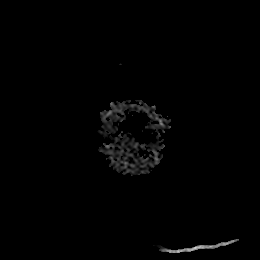
[im 17/34]
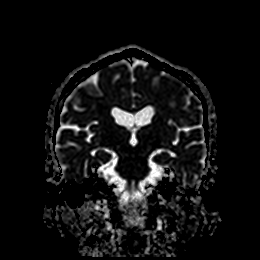
[im 34/34]
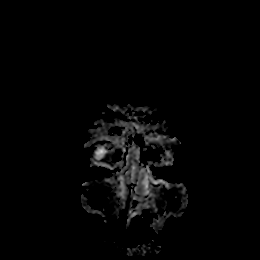

[Series 9: T1 · sagittal · 5.0mm · 0.75mm/px · 2 of 27 slices shown]
[im 1/27]
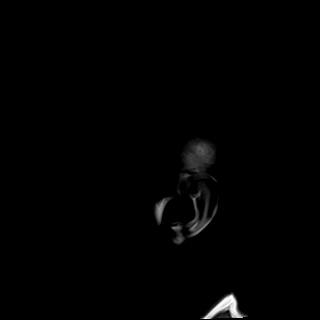
[im 27/27]
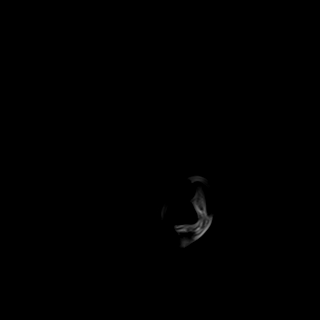

[Series 10: T2 · axial · 5.0mm · 0.72mm/px · z∈[-71,+84]mm · 2 of 27 slices shown (1 of 3)]
[im 1/27]
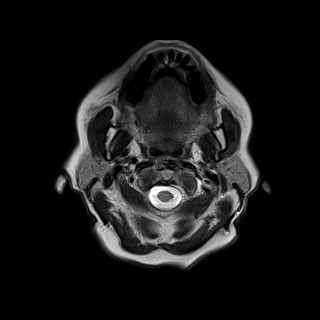
[im 27/27]
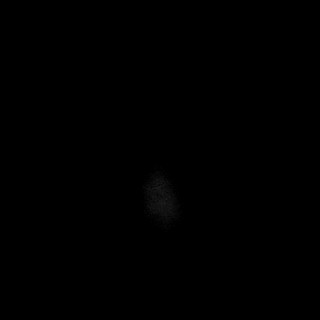

[Series 11: FLAIR · axial · 5.0mm · 0.45mm/px · z∈[-73,+83]mm · 2 of 27 slices shown]
[im 1/27]
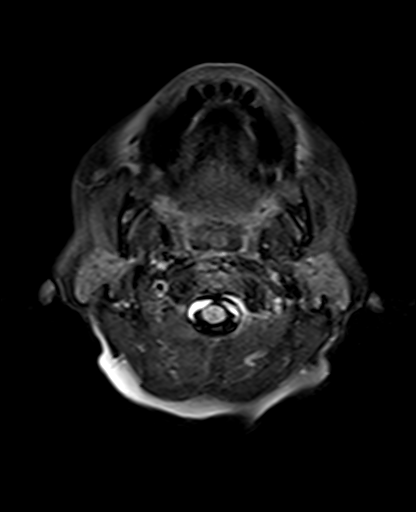
[im 27/27]
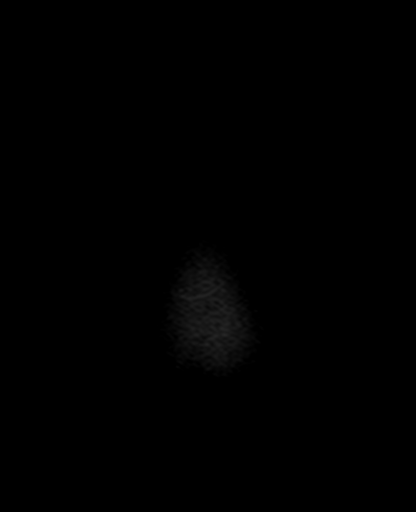

[Series 12: mag_images · axial · 3.0mm · 0.90mm/px · z∈[-68,+84]mm · 4 of 52 slices shown]
[im 1/52]
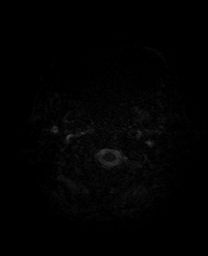
[im 18/52]
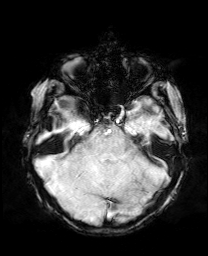
[im 35/52]
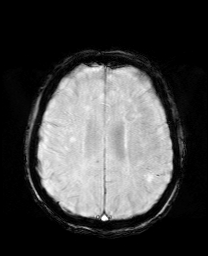
[im 52/52]
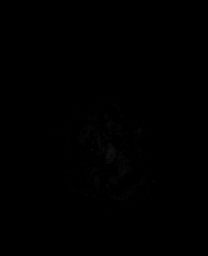

[Series 13: pha_images · axial · 3.0mm · 0.90mm/px · z∈[-68,+84]mm · 4 of 51 slices shown]
[im 1/51]
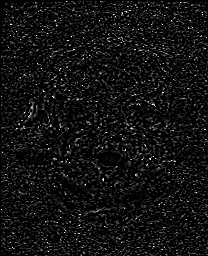
[im 17/51]
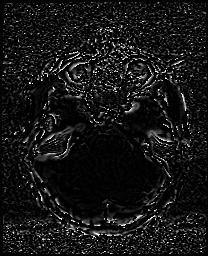
[im 34/51]
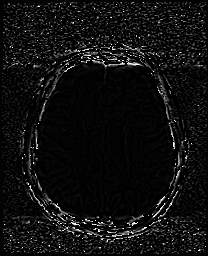
[im 51/51]
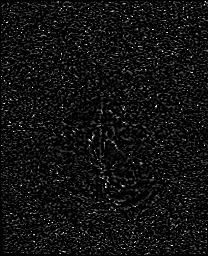

[Series 14: swi_images · axial · 3.0mm · 0.90mm/px · z∈[-68,+84]mm · 4 of 52 slices shown]
[im 1/52]
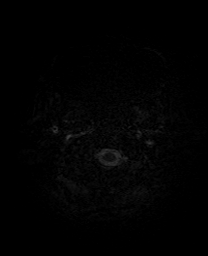
[im 18/52]
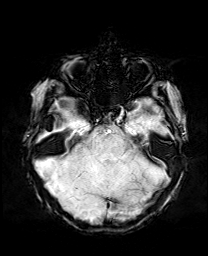
[im 35/52]
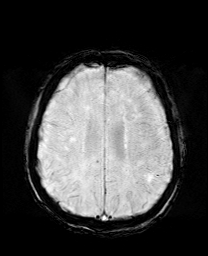
[im 52/52]
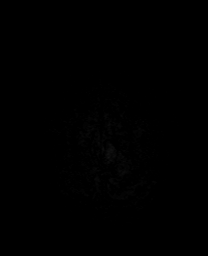

[Series 15: mip_images(sw) · axial · 24.0mm · 0.90mm/px · z∈[-58,+73]mm · 3 of 45 slices shown]
[im 1/45]
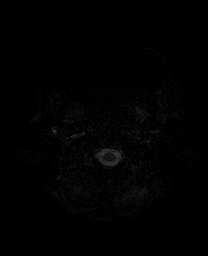
[im 23/45]
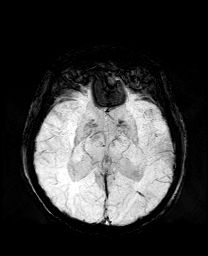
[im 45/45]
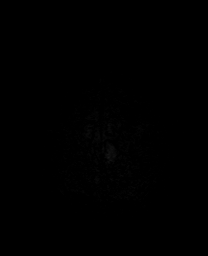

[Series 17: T2 · coronal · 5.0mm · 0.34mm/px · 2 of 29 slices shown (2 of 3)]
[im 1/29]
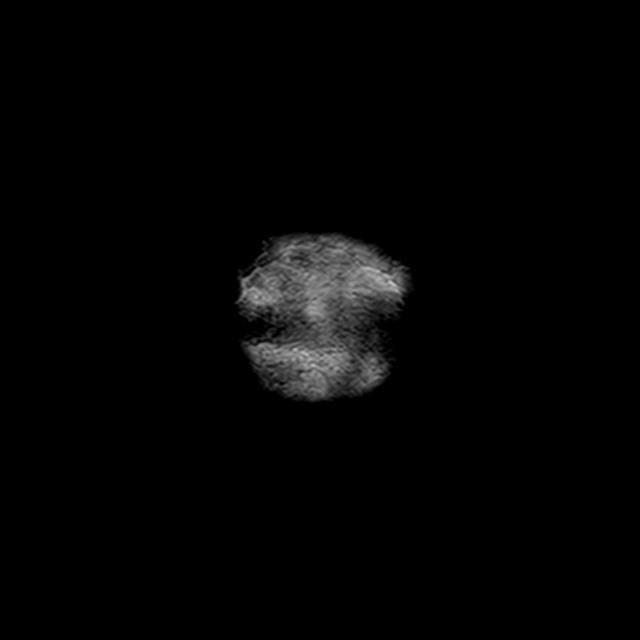
[im 29/29]
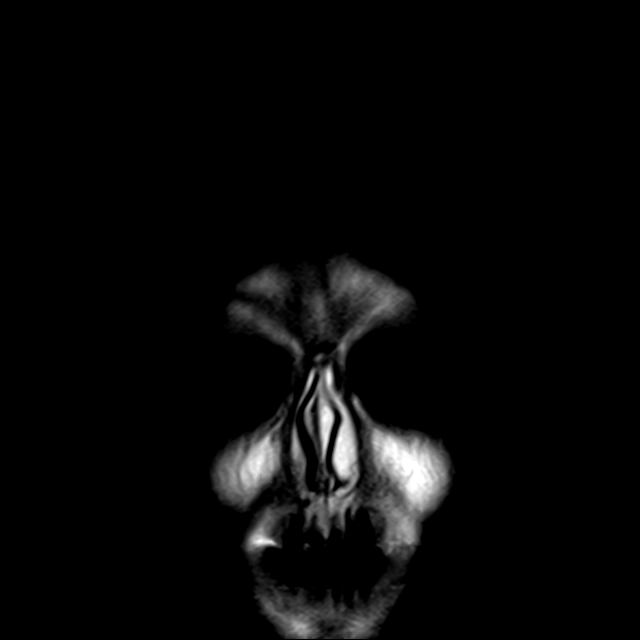

[Series 18: T2 · coronal · 5.0mm · 0.34mm/px · 2 of 29 slices shown (3 of 3)]
[im 1/29]
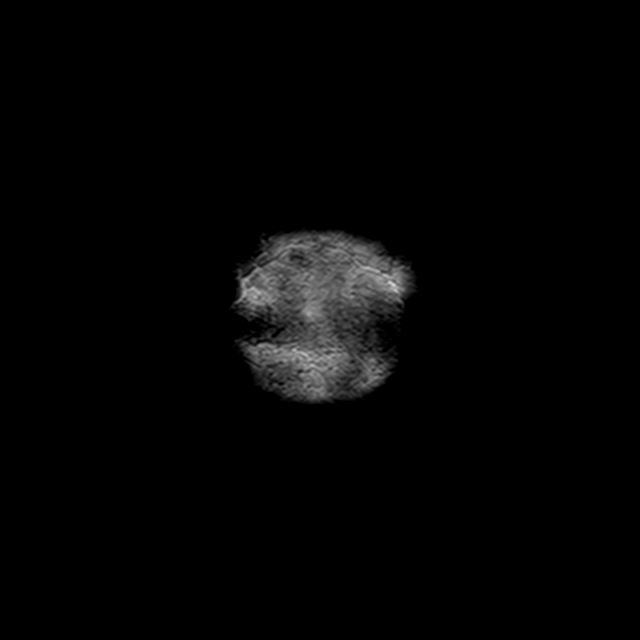
[im 29/29]
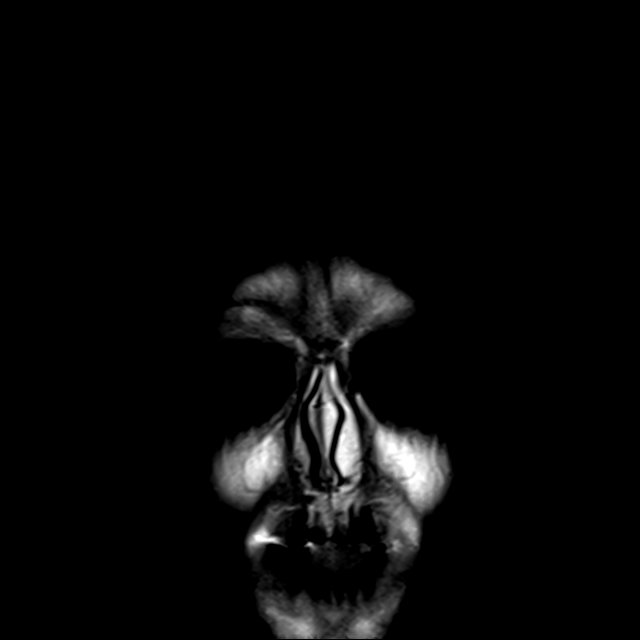

[44 of 48 positions shown; findings below may reference images not displayed]

FINDINGS: Brain: Cerebral volume within normal limits for age. Scattered
patchy T2/FLAIR hyperintensity seen involving the periventricular,
deep, and subcortical white matter both cerebral hemispheres, most
like related chronic microvascular ischemic disease, moderate in
nature.

No abnormal foci of restricted diffusion to suggest acute or
subacute ischemia. Gray-white matter differentiation maintained. No
encephalomalacia to suggest chronic cortical infarction. No acute
intracranial hemorrhage. Few scattered punctate chronic micro
hemorrhages noted involving both cerebral hemispheres, likely
hypertensive in nature.

1.6 cm calcified meningioma overlies the left frontal convexity
without significant edema or mass effect. No other mass lesion, mass
effect or midline shift. No hydrocephalus or extra-axial fluid
collection. Pituitary gland suprasellar region within normal limits.
Midline structures intact and normal.

Vascular: Major intracranial vascular flow voids are maintained.

Skull and upper cervical spine: Craniocervical junction within
normal limits. Bone marrow signal intensity normal. No scalp soft
tissue abnormality

Sinuses/Orbits: Prior bilateral ocular lens replacement. Chronic
left sphenoid sinusitis noted. Scattered mucosal thickening noted
elsewhere throughout the paranasal sinuses. No mastoid effusion.
Inner ear structures grossly normal.

Other: None.
IMPRESSION: 1. No acute intracranial abnormality.
2. Age-related cerebral atrophy with moderate chronic microvascular
ischemic disease.
3. 1.6 cm calcified meningioma overlying the left frontal convexity
without significant edema or mass effect.

## 2022-03-10 ENCOUNTER — Encounter: Payer: Self-pay | Admitting: Family Medicine

## 2022-03-10 ENCOUNTER — Ambulatory Visit (INDEPENDENT_AMBULATORY_CARE_PROVIDER_SITE_OTHER): Payer: PPO

## 2022-03-10 ENCOUNTER — Other Ambulatory Visit: Payer: Self-pay

## 2022-03-10 ENCOUNTER — Ambulatory Visit (INDEPENDENT_AMBULATORY_CARE_PROVIDER_SITE_OTHER): Payer: PPO | Admitting: Family Medicine

## 2022-03-10 VITALS — BP 120/70 | HR 45 | Resp 16 | Ht 63.0 in | Wt 135.0 lb

## 2022-03-10 DIAGNOSIS — Z5181 Encounter for therapeutic drug level monitoring: Secondary | ICD-10-CM | POA: Diagnosis not present

## 2022-03-10 DIAGNOSIS — R600 Localized edema: Secondary | ICD-10-CM

## 2022-03-10 DIAGNOSIS — I5022 Chronic systolic (congestive) heart failure: Secondary | ICD-10-CM | POA: Diagnosis not present

## 2022-03-10 DIAGNOSIS — I340 Nonrheumatic mitral (valve) insufficiency: Secondary | ICD-10-CM | POA: Diagnosis not present

## 2022-03-10 DIAGNOSIS — Z7901 Long term (current) use of anticoagulants: Secondary | ICD-10-CM

## 2022-03-10 DIAGNOSIS — H6121 Impacted cerumen, right ear: Secondary | ICD-10-CM

## 2022-03-10 DIAGNOSIS — Z9889 Other specified postprocedural states: Secondary | ICD-10-CM

## 2022-03-10 LAB — POCT INR: INR: 2.2 (ref 2.0–3.0)

## 2022-03-10 MED ORDER — WARFARIN SODIUM 2 MG PO TABS: ORAL_TABLET | ORAL | 1 refills | Status: DC

## 2022-03-10 NOTE — Patient Instructions (Signed)
Continue taking 1.5 tablets daily except for 1 tablet on Tuesday, Thursday and Saturday. Recheck INR in 5 weeks.  [facility] Clinic [phone] ?

## 2022-03-10 NOTE — Patient Instructions (Addendum)
A few things to remember from today's visit: ? ? ?Hearing loss of right ear due to cerumen impaction ? ?Bilateral lower extremity edema ? ?If you need refills please call your pharmacy. ?Do not use My Chart to request refills or for acute issues that need immediate attention. ?  ?Medias de compresi?n para la hinchaz?n de las piernas, 15-25 mmHg y elevaci?n de las piernas en la tarde. ?Acumulaci?n de cera en el o?do en adultos ?Earwax Buildup, Adult ?Los o?dos producen una sustancia llamada cera que ayuda a evitar que ingresen bacterias en el o?do y protege la piel del canal [place]. En ocasiones, la cera se puede acumular en el o?do y causar molestias o p?rdida de la audici?n. ??Cu?les son las causas? ?Esta afecci?n es causada por una acumulaci?n de cera. Los canales auditivos se limpian por s? solos. La cera de los o?dos se produce en la parte externa del canal auditivo y generalmente cae hacia afuera en peque?as cantidades con el tiempo. ?Cuando el mecanismo de autolimpieza no funciona, la cera se acumula y eso puede reducir la audici?n y Engineer, drilling. Tratar de [employer] o?dos con hisopos puede empujar la cera hacia el interior del canal auditivo y causar dolor y disminuci?n de la audici?n. ??Qu? [place]? ?Es m?s probable que esta afecci?n se manifieste en las personas que presentan alguna de estas caracter?sticas: ?Se limpian frecuentemente los o?dos con hisopos. ?Se escarban los o?dos. ?Utilizan tapones para los o?dos o auriculares internos con frecuencia, o usan aud?fonos. ?Los siguientes factores tambi?n pueden hacer que usted sea m?s propenso a Armed forces training and education officer afecci?n: ?Ser hombre. ?Ser Ardelia Mems persona de edad avanzada. ?Producir m?s cera de forma natural. ?Tener los canales [employer] estrechos. ?Tener cera que es demasiado densa o pegajosa. ?Tener vello excesivo en el Control and instrumentation engineer. ?Tener eccema. ?Estar deshidratado. ??Cu?les son los signos o s?ntomas? ?Los s?ntomas de esta afecci?n  incluyen: ?Disminuci?n de la audici?n. ?Sensaci?n de que el o?do est? lleno u obstruido. ?Secreci?n de l?quido. ?Dolor o Biomedical engineer o?do. ?Zumbidos en el o?do. ?Tos. ?Trastornos del equilibrio. ?Porci?n de cera que se puede observar en el interior del Control and instrumentation engineer. ??C?mo se diagnostica? ?Esta afecci?n se puede diagnosticar en funci?n de lo siguiente: ?Sus s?ntomas. ?Sus antecedentes m?dicos. ?Un examen de o?do. Durante el examen, el m?dico mirar? dentro de su o?do con un instrumento llamado otoscopio. ?Pueden hacerle otros estudios, como una prueba de audici?n. ??C?mo se trata? ?El tratamiento para esta afecci?n puede incluir lo siguiente: ?Gotas ?ticas para ablandar la cera. ?Extracci?n de cera realizada por un m?dico. El m?dico tambi?n podr? hacer lo siguiente: ?Enjuagar el o?do con agua. ?Usar un instrumento con un anillo en el extremo (cureta). ?Usar un dispositivo de succi?n. ?Realizar una cirug?a para extraer la acumulaci?n de cera. Esto puede Editor, commissioning graves. ?Siga estas instrucciones en su casa: ? ?Use los medicamentos de venta libre y los recetados solamente como se lo haya indicado el m?dico. ?No se introduzca ning?n objeto en el o?do, ni siquiera hisopos de algod?n. La abertura del canal auditivo se puede limpiar con un pa?o o pa?uelo facial. ?Siga las instrucciones del m?dico acerca de c?mo limpiarse los o?dos. No se limpie los o?dos en exceso. ?Beber suficiente l?quido como para Theatre manager la orina de color amarillo p?lido. Esto ayudar? a Cabin crew. ?Concurra a todas las visitas de seguimiento como se lo hayan indicado. Si tiene acumulaci?n de cera en el o?do con frecuencia o [place] aud?fonos, visite a su m?dico para que le  realice una limpieza de o?do de rutina y preventiva. Preg?ntele al m?dico con qu? frecuencia debe programar estas limpiezas. ?Si tiene aud?fonos, l?mpielos seg?n las instrucciones del fabricante y de su m?dico. ?Comun?quese con un m?dico si: ?Tiene dolor de  o?do. ?Presenta fiebre. ?Le sale pus u otro l?quido del o?do. ?Tiene p?rdida de la audici?n. ?Tiene zumbidos en el o?do que no desaparecen. ?Tiene la sensaci?n de que la habitaci?n da vueltas (v?rtigo). ?Los s?ntomas no mejoran con Dispensing optician. ?Solicite ayuda de inmediato si: ?Le sangra el o?do afectado. ?Tiene dolor intenso de o?do. ?Resumen ?La cera se puede acumular en el o?do y causar molestias o p?rdida de la audici?n. ?Los s?ntomas m?s comunes de esta afecci?n incluyen una audici?n reducida o [place] y la sensaci?n de que el o?do est? lleno o est? obstruido. ?Esta afecci?n se puede diagnosticar en funci?n de los s?ntomas, sus antecedentes m?dicos y un examen de o?do. ?Esta afecci?n se puede tratar mediante gotas ?ticas que ablandan la cera o mediante la extracci?n de la cera que realiza el m?dico. ?No se introduzca ning?n objeto en el o?do, ni siquiera hisopos de algod?n. La abertura del canal auditivo se puede limpiar con un pa?o o pa?uelo facial. ?Esta informaci?n no tiene como fin reemplazar el consejo del m?dico. Aseg?rese de hacerle al m?dico cualquier pregunta que tenga. ?Document Revised: 05/10/2020 Document Reviewed: 05/10/2020 ?Elsevier Patient Education ? [place]. ? ? ? ? ? ? ?

## 2022-03-11 NOTE — Telephone Encounter (Signed)
Last Prolia inj 02/10/22 ?Next Prolia inj due 08/14/22 ?

## 2022-03-13 ENCOUNTER — Other Ambulatory Visit: Payer: Self-pay | Admitting: Neurology

## 2022-03-17 ENCOUNTER — Telehealth: Payer: Self-pay

## 2022-03-17 ENCOUNTER — Ambulatory Visit: Payer: PPO | Admitting: Neurology

## 2022-03-17 NOTE — Telephone Encounter (Signed)
? ?  Pre-operative Risk Assessment  ?  ?[name]  ?DOB: 07/04/1946 ?MRN: [id number]  ? ?  ? ?Request for Surgical Clearance   ? ?Procedure:   C3-4 C4-5 Anterior cervical decompression/discectomy/fusion ? ?Date of Surgery:  Clearance TBD                              ?   ?Surgeon:  Earnie Larsson   ?Surgeon's Group or Practice Name:  [facility] ?Phone number:  [phone] ?Fax number:  [phone] ?  ?Type of Clearance Requested:   ?- Pharmacy:  Hold Aspirin and Warfarin (Coumadin) will need to hold prior to procedure ?  ?Type of Anesthesia:  General  ?  ?Additional requests/questions:   ? ?Signed, ?Kathreen Devoid   ?03/17/2022, 7:39 AM  ? ?

## 2022-03-18 NOTE — Telephone Encounter (Signed)
1st attempt to reach pt regarding the need for labwork before clearance can be granted, left message for pt to call back.  ?

## 2022-03-18 NOTE — Telephone Encounter (Signed)
Patient with diagnosis of afib on warfarin for anticoagulation.   ? ?Procedure:  C3-4 C4-5 Anterior cervical decompression/discectomy/fusion ?Date of procedure: TBD ? ?CHA2DS2-VASc Score = 7  ? This indicates a 11.2% annual risk of stroke. ?The patient's score is based upon: ?CHF History: 1 ?HTN History: 1 ?Diabetes History: 0 ?Stroke History: 2 ?Vascular Disease History: 0 ?Age Score: 2 ?Gender Score: 1 ?  ?  ? ?With patient's hx of stroke she qualifies for a Lovenox bridge. Given her age, I will confirm with Dr. Gwenlyn Found that he would like patient bridged. ?If she is bridged, she will need updated BMP and CBC. ? ? ? ?

## 2022-03-18 NOTE — Telephone Encounter (Signed)
Patient will need Lovenox bridge. ?Patient will need updated BMP and CBC prior to bridge. Please have patient come in for lab work. ?Please have patient tell [facility] coumadin clinic as soon as procedure is scheduled so bridge can be coordinated. ?

## 2022-03-20 ENCOUNTER — Telehealth: Payer: Self-pay

## 2022-03-20 DIAGNOSIS — Z7901 Long term (current) use of anticoagulants: Secondary | ICD-10-CM

## 2022-03-20 DIAGNOSIS — I480 Paroxysmal atrial fibrillation: Secondary | ICD-10-CM

## 2022-03-20 NOTE — Telephone Encounter (Signed)
I spoke to the patient and spouse and let them know that she needs labs drawn prior to clearance/back surgery.  They will come to office in the next few days to have drawn. ?

## 2022-03-24 NOTE — Telephone Encounter (Signed)
Left detailed message for the pt that she is going to need lab work for pre op clearance. Lab orders have been placed. Left message to call our office to let us know when she is going to get the lab work done so that we may proceed with the clearance request. Lab orders are in for the [facility] office .  ?

## 2022-03-25 ENCOUNTER — Other Ambulatory Visit: Payer: Self-pay | Admitting: Neurology

## 2022-03-25 DIAGNOSIS — I480 Paroxysmal atrial fibrillation: Secondary | ICD-10-CM | POA: Diagnosis not present

## 2022-03-25 DIAGNOSIS — Z7901 Long term (current) use of anticoagulants: Secondary | ICD-10-CM | POA: Diagnosis not present

## 2022-03-25 LAB — CBC WITH DIFFERENTIAL/PLATELET
Basophils Absolute: 0.1 10*3/uL (ref 0.0–0.2)
Basos: 1 %
EOS (ABSOLUTE): 0.2 10*3/uL (ref 0.0–0.4)
Eos: 3 %
Hematocrit: 40.5 % (ref 34.0–46.6)
Hemoglobin: 13.6 g/dL (ref 11.1–15.9)
Immature Grans (Abs): 0 10*3/uL (ref 0.0–0.1)
Immature Granulocytes: 0 %
Lymphocytes Absolute: 1.9 10*3/uL (ref 0.7–3.1)
Lymphs: 28 %
MCH: 33.1 pg — ABNORMAL HIGH (ref 26.6–33.0)
MCHC: 33.6 g/dL (ref 31.5–35.7)
MCV: 99 fL — ABNORMAL HIGH (ref 79–97)
Monocytes Absolute: 0.6 10*3/uL (ref 0.1–0.9)
Monocytes: 9 %
Neutrophils Absolute: 4 10*3/uL (ref 1.4–7.0)
Neutrophils: 59 %
Platelets: 232 10*3/uL (ref 150–450)
RBC: 4.11 x10E6/uL (ref 3.77–5.28)
RDW: 12.6 % (ref 11.7–15.4)
WBC: 6.8 10*3/uL (ref 3.4–10.8)

## 2022-03-25 LAB — BASIC METABOLIC PANEL
BUN/Creatinine Ratio: 25 (ref 12–28)
BUN: 21 mg/dL (ref 8–27)
CO2: 27 mmol/L (ref 20–29)
Calcium: 9.8 mg/dL (ref 8.7–10.3)
Chloride: 106 mmol/L (ref 96–106)
Creatinine, Ser: 0.83 mg/dL (ref 0.57–1.00)
Glucose: 87 mg/dL (ref 70–99)
Potassium: 4.8 mmol/L (ref 3.5–5.2)
Sodium: 143 mmol/L (ref 134–144)
eGFR: 73 mL/min/{1.73_m2} (ref >59)

## 2022-03-25 NOTE — Telephone Encounter (Signed)
Patient is cleared from a pharmacy standpoint. But we do need labs in order to coordinate her bridge to make sure we choose the correct dose of lovenox and her Plt are high enough. ?

## 2022-03-25 NOTE — Telephone Encounter (Signed)
We are waiting on the pt to call back about labs.  ? ?

## 2022-03-26 NOTE — Telephone Encounter (Signed)
Noted. Callback team is pt callback to schedule labs, then we will plan for request for tele visit once those are in and pharm review is complete. ?

## 2022-03-26 NOTE — Telephone Encounter (Signed)
Covering preop today. As below we are awaiting labs to help determine the blood thinner holding. ? ?Last OV reviewed from Dr. Gwenlyn Found at which time she was complaining of some fatigue in 12/2021. HR listed as 43 but EKG showed HR 80s with atrial bigeminy. He comments on her decline in LV function from 50-55 to 40-45% in 06/2021, with plan to repeat study later this year. She does have history of normal coronaries in 02/2021. I will route to Dr. Gwenlyn Found for input on whether he is OK with [place] clearing patient for cervical spine surgery as long as asymptomatic in a tele visit, or if he feels a repeat echo would be helpful first. Dr. Gwenlyn Found - Please route response to P CV DIV PREOP (the pre-op pool). Thank you. ?

## 2022-03-27 ENCOUNTER — Other Ambulatory Visit (HOSPITAL_COMMUNITY): Payer: PPO

## 2022-03-27 ENCOUNTER — Telehealth: Payer: Self-pay

## 2022-03-27 NOTE — Telephone Encounter (Signed)
Contacted the patient and appointment scheduled. ?

## 2022-03-27 NOTE — Telephone Encounter (Signed)
?  Patient Consent for Virtual Visit  ? ? ?    ? ?[name] [name] has provided verbal consent on 03/27/2022 for a virtual visit (video or telephone). ? ? ?CONSENT FOR VIRTUAL VISIT FOR:  [name] [name]  ?By participating in this virtual visit I agree to the following: ? ?I hereby voluntarily request, consent and authorize [facility] and its employed or contracted physicians, physician assistants, nurse practitioners or other licensed health care professionals (the Practitioner), to provide me with telemedicine health care services (the ?Services") as deemed necessary by the treating Practitioner. I acknowledge and consent to receive the Services by the Practitioner via telemedicine. I understand that the telemedicine visit will involve communicating with the Practitioner through live audiovisual communication technology and the disclosure of certain medical information by electronic transmission. I acknowledge that I have been given the opportunity to request an in-person assessment or other available alternative prior to the telemedicine visit and am voluntarily participating in the telemedicine visit. ? ?I understand that I have the right to withhold or withdraw my consent to the use of telemedicine in the course of my care at any time, without affecting my right to future care or treatment, and that the Practitioner or I may terminate the telemedicine visit at any time. I understand that I have the right to inspect all information obtained and/or recorded in the course of the telemedicine visit and may receive copies of available information for a reasonable fee.  I understand that some of the potential risks of receiving the Services via telemedicine include:  ?Delay or interruption in medical evaluation due to technological equipment failure or disruption; ?Information transmitted may not be sufficient (e.g. poor resolution of images) to allow for appropriate medical decision making by the  Practitioner; and/or  ?In rare instances, security protocols could fail, causing a breach of personal health information. ? ?Furthermore, I acknowledge that it is my responsibility to provide information about my medical history, conditions and care that is complete and accurate to the best of my ability. I acknowledge that Practitioner's advice, recommendations, and/or decision may be based on factors not within their control, such as incomplete or inaccurate data provided by me or distortions of diagnostic images or specimens that may result from electronic transmissions. I understand that the practice of medicine is not an exact science and that Practitioner makes no warranties or guarantees regarding treatment outcomes. I acknowledge that a copy of this consent can be made available to me via my patient portal ([facility]), or I can request a printed copy by calling the office of [facility].   ? ?I understand that my insurance will be billed for this visit.  ? ?I have read or had this consent read to me. ?I understand the contents of this consent, which adequately explains the benefits and risks of the Services being provided via telemedicine.  ?I have been provided ample opportunity to ask questions regarding this consent and the Services and have had my questions answered to my satisfaction. ?I give my informed consent for the services to be provided through the use of telemedicine in my medical care ? ? ? ?

## 2022-03-27 NOTE — Telephone Encounter (Signed)
Pre-op covering staff, looks like she had labs drawn earlier this week. Can we please get her set up for a virtual visit with pre-op APP so we can complete clearance? ? ?Thank you so much! ? ?

## 2022-03-28 ENCOUNTER — Encounter: Payer: Self-pay | Admitting: Student

## 2022-03-28 ENCOUNTER — Ambulatory Visit (INDEPENDENT_AMBULATORY_CARE_PROVIDER_SITE_OTHER): Payer: PPO | Admitting: Student

## 2022-03-28 DIAGNOSIS — Z01810 Encounter for preprocedural cardiovascular examination: Secondary | ICD-10-CM

## 2022-03-28 NOTE — Progress Notes (Addendum)
? ?Virtual Visit via Telephone Note  ? ?This visit type was conducted due to national recommendations for restrictions regarding the COVID-19 Pandemic (e.g. social distancing) in an effort to limit this patient's exposure and mitigate transmission in our community.  Due to her co-morbid illnesses, this patient is at least at moderate risk for complications without adequate follow up.  This format is felt to be most appropriate for this patient at this time.  The patient did not have access to video technology/had technical difficulties with video requiring transitioning to audio format only (telephone).  All issues noted in this document were discussed and addressed.  No physical exam could be performed with this format.  Please refer to the patient's chart for her  consent to telehealth for [facility]. ?Evaluation Performed:  Preoperative cardiovascular risk assessment ? ?This visit type was conducted due to national recommendations for restrictions regarding the COVID-19 Pandemic (e.g. social distancing).  This format is felt to be most appropriate for this patient at this time.  All issues noted in this document were discussed and addressed.  No physical exam was performed (except for noted visual exam findings with Video Visits).  Please refer to the patient's chart (MyChart message for video visits and phone note for telephone visits) for the patient's consent to telehealth for [facility]. ?_____________  ? ?Date:  03/28/2022  ? ?Patient ID:  Aislinn, Feliz 06/02/1946, MRN [id number] ?Patient Location:  ?Home ?Provider location:   ?Office ? ?Primary Care Provider:  Martinique, Betty G, MD ?Primary Cardiologist:  Quay Burow, MD ? ?Chief Complaint  ?  ?CARIN SHIPP is a 76 y.o. female with a history of normal coronaries on cardiac catheterization in 02/2021, severe mitral regurgitation s/p minimally invasive mitral valve repair in 04/2021, post-op atrial fibrillation on Coumadin, occipital  stroke in 07/2021, hypertension, hyperlipidemia, GERD, IBS, and migraines who is pending spinal surgery (C3-4 C4-5 Anterior cervical decompression/discectomy/fusion) and presents today for telephonic preoperative cardiovascular risk assessment. ? ?Past Medical History  ?  ?Past Medical History:  ?Diagnosis Date  ? Arthritis   ? Atypical chest pain   ? Diverticulosis   ? Gastritis 11/08/2007, 04/12/2012  ? H pylori bx neg 03/2012  ? GERD (gastroesophageal reflux disease) 11/08/2007, 04/12/2012  ? Hepatic cyst   ? Hiatal hernia 11/08/2007, 04/12/2012  ? History of kidney stones   ? Hyperlipidemia   ? Hypertension   ? IBS (irritable bowel syndrome)   ? diarrhea predom  ? Migraine   ? Mitral regurgitation   ? Osteoporosis   ? DEXA 01/21/2012: -2.1 spine and R fem, -1.8 L fem s/p fosamax  2004-2011 DEXA 04/18/14 @ [facility]: -2.5 - rec to start Prolia    ? PVC's (premature ventricular contractions)   ? 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 Complex valvuloplasty including PTFE neochord placement x6 with 30 mm Medtronic Simuform ring annuloplasty with clipping of LA appendage  ? Schatzki's ring 11/11/2010  ? 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  ? Shingles outbreak 04/2013  ? ?Past Surgical History:  ?Procedure Laterality Date  ? APPENDECTOMY  1958  ? BUBBLE STUDY  03/14/2021  ? Procedure: BUBBLE STUDY;  Surgeon: Sueanne Margarita, MD;  Location: [facility];  Service: Cardiovascular;;  ? CARDIAC CATHETERIZATION    ? CLIPPING OF ATRIAL APPENDAGE N/A 05/21/2021  ? Procedure: CLIPPING OF ATRIAL APPENDAGE USING ATRICURE 40MM PRO2 CLIP;  Surgeon: Rexene Alberts, MD;  Location: [facility];  Service: Open Heart Surgery;  Laterality: N/A;  ?  COLONOSCOPY    ? CYSTOCELE REPAIR  2008  ? ESOPHAGOGASTRODUODENOSCOPY    ? Maxilofacial  1992  ? MITRAL VALVE REPAIR  05/21/2021  ? Procedure: MINIMALLY INVASIVE MITRAL VALVE REPAIR (MVR) USING MEDTRONIC SIMUFORM 30MM RING;  Surgeon: Rexene Alberts, MD;  Location: [facility];  Service: Open  Heart Surgery;;  ? RIGHT/LEFT HEART CATH AND CORONARY ANGIOGRAPHY N/A 03/14/2021  ? Procedure: RIGHT/LEFT HEART CATH AND CORONARY ANGIOGRAPHY;  Surgeon: Lorretta Harp, MD;  Location: [facility] CV LAB;  Service: Cardiovascular;  Laterality: N/A;  ? TEE WITHOUT CARDIOVERSION N/A 03/14/2021  ? Procedure: TRANSESOPHAGEAL ECHOCARDIOGRAM (TEE);  Surgeon: Sueanne Margarita, MD;  Location: [facility];  Service: Cardiovascular;  Laterality: N/A;  ? TEE WITHOUT CARDIOVERSION N/A 05/21/2021  ? Procedure: TRANSESOPHAGEAL ECHOCARDIOGRAM (TEE);  Surgeon: Rexene Alberts, MD;  Location: [facility];  Service: Open Heart Surgery;  Laterality: N/A;  ? TONSILLECTOMY  1960  ? ? ?Allergies ? ?No Known Allergies ? ?History of Present Illness  ?  ?JONELL BRUMBAUGH is a 76 y.o. female who presents via audio/video conferencing for a telehealth visit today.  Patient was last seen in cardiology clinic on 12/31/2021 by Dr. Gwenlyn Found.  At that time, GRETTEL RAMES was continuing to improve but was still complaining of some fatigue.  She is now pending a spinal surgery (C3-4 C4-5 Anterior cervical decompression/discectomy/fusion).  Since her last visit, she has done well from a cardiac standpoint. She denies any chest pain, shortness of breath, orthopnea, PND, palpitations, dizziness, syncope. She is able to complete >4.0 METS of activity without any problems. ? ?Home Medications  ?  ?Prior to Admission medications   ?Medication Sig Start Date End Date Taking? Authorizing Provider  ?acetaminophen (TYLENOL) 500 MG tablet Take 500 mg by mouth every 4 (four) hours as needed for mild pain.    [provider]  ?amLODipine (NORVASC) 2.5 MG tablet Take 1 tablet (2.5 mg total) by mouth daily. Take 1 tablet in the evening 12/11/21 03/27/22  Deberah Pelton, NP  ?amLODipine (NORVASC) 5 MG tablet Take 1 tablet (5 mg total) by mouth daily. 10/28/21 03/27/22  Deberah Pelton, NP  ?aspirin EC 81 MG EC tablet Take 1 tablet (81 mg total) by mouth daily.  Swallow whole. 05/26/21   Barrett, Erin R, PA-C  ?atorvastatin (LIPITOR) 40 MG tablet Take 1 tablet (40 mg total) by mouth daily. 10/28/21   Deberah Pelton, NP  ?gabapentin (NEURONTIN) 100 MG capsule Take 1 capsule in morning and 2 capsules at bedtime ?Patient taking differently: Patient is taking 200 mg at night 08/02/21   Pieter Partridge, DO  ?loperamide (IMODIUM A-D) 2 MG tablet Take 1 tablet (2 mg total) by mouth as needed for diarrhea or loose stools. 10/30/21   Yetta Flock, MD  ?metoprolol succinate (TOPROL-XL) 25 MG 24 hr tablet TAKE ONE TABLET BY MOUTH EVERY MORNING and TAKE ONE TABLET BY MOUTH EVERYDAY AT BEDTIME 02/12/22   Lorretta Harp, MD  ?Peppermint Oil (IBGARD) 90 MG CPCR Use as needed 10/30/21   Armbruster, Carlota Raspberry, MD  ?warfarin (COUMADIN) 2 MG tablet TAKE 1-2 TABLETS BY MOUTH ONCE DAILY OR AS PRESCRIBED BY COUMADIN CLINIC 03/10/22   Lorretta Harp, MD  ? ? ?Physical Exam  ?  ?Vital Signs:  JAYDALYN DEMATTIA does not have vital signs available for review today. ? ?Given telephonic nature of communication, physical exam is limited: ?Alert and oriented x3. No acute distress. Normal affect. Speech and  respirations are unlabored. ? ?Accessory Clinical Findings  ?  ?None ? ?Assessment & Plan  ?  ?Preoperative Cardiovascular Risk Assessment: ?Patient has upcoming spinal surgery (C3-4 C4-5 Anterior cervical decompression/discectomy/fusion) planned. She is stable from a cardiac standpoint with with no angina, shortness of breath, acute CHF symptoms, palpitations, syncope. She is able to complete >4.0 METS without any problems. Therefore, based on ACC/AHA guidelines, patient would be at acceptable risk for the planned procedure without further cardiovascular testing. Per Pharmacy she is OK to hold Coumadin but will need a Lovenox bridge. Our office will help arrange this. She was asked to notify our Coumadin Clinic as soon as she has a date for surgery so they can help arrange this. She thinks it  will probably be in mid June. We were also asked to give our recommendations for holding Aspirin. Given she will be on a Lovenox bridge, it should be OK to hold Aspirin for 7 days prior to procedure. I will con

## 2022-03-28 NOTE — Telephone Encounter (Signed)
Hi Dr. Gwenlyn Found. Sorry to send you another message on this lady. I am scheduled to have a virtual pre-op visit with her later this morning. We have already addressed the need for her Lovenox bridge but looks like they are also wanting to hold Aspirin. She had clean coronaries on cath in 02/2021 but had that occipital stroke in 07/2021. Is she okay to hold Aspirin too? ? ?Please route response back to P CV DIV PREOP. ? ?Thanks so much! ?Ellaree Gear ?

## 2022-03-31 ENCOUNTER — Telehealth: Payer: Self-pay

## 2022-03-31 NOTE — Telephone Encounter (Signed)
I spoke to patient and husband.  They do not have a date set for upcoming back procedure, tentatively figure mid June.  I see her 5/22 and we will further discuss. ?

## 2022-04-01 ENCOUNTER — Other Ambulatory Visit: Payer: Self-pay | Admitting: Neurosurgery

## 2022-04-14 ENCOUNTER — Other Ambulatory Visit: Payer: Self-pay

## 2022-04-14 ENCOUNTER — Ambulatory Visit (INDEPENDENT_AMBULATORY_CARE_PROVIDER_SITE_OTHER): Payer: PPO

## 2022-04-14 DIAGNOSIS — I340 Nonrheumatic mitral (valve) insufficiency: Secondary | ICD-10-CM

## 2022-04-14 DIAGNOSIS — Z5181 Encounter for therapeutic drug level monitoring: Secondary | ICD-10-CM

## 2022-04-14 DIAGNOSIS — Z9889 Other specified postprocedural states: Secondary | ICD-10-CM | POA: Diagnosis not present

## 2022-04-14 DIAGNOSIS — Z7901 Long term (current) use of anticoagulants: Secondary | ICD-10-CM

## 2022-04-14 LAB — POCT INR: INR: 2.1 (ref 2.0–3.0)

## 2022-04-14 MED ORDER — ENOXAPARIN SODIUM 60 MG/0.6ML IJ SOSY: 60.0000 mg | PREFILLED_SYRINGE | Freq: Two times a day (BID) | INTRAMUSCULAR | 1 refills | Status: DC

## 2022-04-14 NOTE — Patient Instructions (Signed)
Continue taking 1.5 tablets daily except for 1 tablet on Tuesday, Thursday and Saturday. Recheck INR in 5 weeks.  Coumadin Clinic [phone];  [place] for 6/13 surgery;  HOLDING Coumadin 5 days prior;  6/7: Last dose of warfarin.  6/8: No warfarin or enoxaparin (Lovenox).  6/9: Inject enoxaparin '60mg'$  in the fatty abdominal tissue at least 2 inches from the belly button twice a day about 12 hours apart, 8am and 8pm rotate sites. No warfarin.  6/10: Inject enoxaparin in the fatty tissue every 12 hours, 8am and 8pm. No warfarin.  6/11: Inject enoxaparin in the fatty tissue every 12 hours, 8am and 8pm. No warfarin.  6/12: Inject enoxaparin in the fatty tissue in the morning at 8 am (No PM dose). No warfarin.  6/13: Procedure Day - No enoxaparin - Resume warfarin in the evening or as directed by doctor (take an extra half tablet with usual dose for 2 days then resume normal dose).  6/14: Resume enoxaparin inject in the fatty tissue every 12 hours and take warfarin  6/15: Inject enoxaparin in the fatty tissue every 12 hours and take warfarin  6/16: Inject enoxaparin in the fatty tissue every 12 hours and take warfarin  6/17: Inject enoxaparin in the fatty tissue every 12 hours and take warfarin  6/18: Inject enoxaparin in the fatty tissue every 12 hours and take warfarin  6/19: warfarin appt to check INR.

## 2022-04-24 ENCOUNTER — Other Ambulatory Visit: Payer: Self-pay | Admitting: Neurology

## 2022-04-24 DIAGNOSIS — M4312 Spondylolisthesis, cervical region: Secondary | ICD-10-CM | POA: Diagnosis not present

## 2022-04-25 NOTE — Progress Notes (Signed)
Surgical Instructions    Your procedure is scheduled on Tuesday, June 13th, 2023.   Report to [facility] Main Entrance "A" at 09:20 A.M., then check in with the Admitting office.  Call this number if you have problems the morning of surgery:  [phone]   If you have any questions prior to your surgery date call [phone]: Open Monday-Friday 8am-4pm    Remember:  Do not eat or drink after midnight the night before your surgery    Take these medicines the morning of surgery with A SIP OF WATER:   amLODipine (NORVASC)  enoxaparin (LOVENOX) metoprolol succinate (TOPROL-XL) Polyethyl Glycol-Propyl Glycol (SYSTANE)  If needed:  acetaminophen (TYLENOL)   Hold Aspirin 7 days prior surgery per MD.  Coumadin - last dose on 04/30/22 per MD.  As of today, STOP taking any Aspirin (unless otherwise instructed by your surgeon) Aleve, Naproxen, Ibuprofen, Motrin, Advil, Goody's, BC's, all herbal medications, fish oil, and all vitamins.    The day of surgery:          Do not wear jewelry or makeup Do not wear lotions, powders, perfumes, or deodorant. Do not shave 48 hours prior to surgery.   Do not bring valuables to the hospital. Do not wear nail polish, gel polish, artificial nails, or any other type of covering on natural nails (fingers and toes) If you have artificial nails or gel coating that need to be removed by a nail salon, please have this removed prior to surgery. Artificial nails or gel coating may interfere with anesthesia's ability to adequately monitor your vital signs.  [facility] is not responsible for any belongings or valuables. .   Do NOT Smoke (Tobacco/Vaping)  24 hours prior to your procedure  If you use a CPAP at night, you may bring your mask for your overnight stay.   Contacts, glasses, hearing aids, dentures or partials may not be worn into surgery, please bring cases for these belongings   For patients admitted to the hospital, discharge time will be  determined by your treatment team.   Patients discharged the day of surgery will not be allowed to drive home, and someone needs to stay with them for 24 hours.   SURGICAL WAITING ROOM VISITATION Patients having surgery or a procedure in a hospital may have two support people. Children under the age of 105 must have an adult with them who is not the patient. They may stay in the waiting area during the procedure and may switch out with other visitors. If the patient needs to stay at the hospital during part of their recovery, the visitor guidelines for inpatient rooms apply.  Please refer to the [facility] website for the visitor guidelines for Inpatients (after your surgery is over and you are in a regular room).    Special instructions:    Oral Hygiene is also important to reduce your risk of infection.  Remember - BRUSH YOUR TEETH THE MORNING OF SURGERY WITH YOUR REGULAR TOOTHPASTE   - Preparing For Surgery  Before surgery, you can play an important role. Because skin is not sterile, your skin needs to be as free of germs as possible. You can reduce the number of germs on your skin by washing with CHG (chlorahexidine gluconate) Soap before surgery.  CHG is an antiseptic cleaner which kills germs and bonds with the skin to continue killing germs even after washing.     Please do not use if you have an allergy to CHG or antibacterial  soaps. If your skin becomes reddened/irritated stop using the CHG.  Do not shave (including legs and underarms) for at least 48 hours prior to first CHG shower. It is OK to shave your face.  Please follow these instructions carefully.     Shower the NIGHT BEFORE SURGERY and the MORNING OF SURGERY with CHG Soap.   If you chose to wash your hair, wash your hair first as usual with your normal shampoo. After you shampoo, rinse your hair and body thoroughly to remove the shampoo.  Then [employer] and genitals (private parts) with your normal soap and  rinse thoroughly to remove soap.  After that Use CHG Soap as you would any other liquid soap. You can apply CHG directly to the skin and wash gently with a scrungie or a clean washcloth.   Apply the CHG Soap to your body ONLY FROM THE NECK DOWN.  Do not use on open wounds or open sores. Avoid contact with your eyes, ears, mouth and genitals (private parts). Wash Face and genitals (private parts)  with your normal soap.   Wash thoroughly, paying special attention to the area where your surgery will be performed.  Thoroughly rinse your body with warm water from the neck down.  DO NOT shower/wash with your normal soap after using and rinsing off the CHG Soap.  Pat yourself dry with a CLEAN TOWEL.  Wear CLEAN PAJAMAS to bed the night before surgery  Place CLEAN SHEETS on your bed the night before your surgery  DO NOT SLEEP WITH PETS.   Day of Surgery:  Take a shower with CHG soap. Wear Clean/Comfortable clothing the morning of surgery Do not apply any deodorants/lotions.   Remember to brush your teeth WITH YOUR REGULAR TOOTHPASTE.    If you received a COVID test during your pre-op visit, it is requested that you wear a mask when out in public, stay away from anyone that may not be feeling well, and notify your surgeon if you develop symptoms. If you have been in contact with anyone that has tested positive in the last 10 days, please notify your surgeon.    Please read over the following fact sheets that you were given.

## 2022-04-28 ENCOUNTER — Other Ambulatory Visit: Payer: Self-pay

## 2022-04-28 ENCOUNTER — Encounter (HOSPITAL_COMMUNITY): Payer: Self-pay

## 2022-04-28 ENCOUNTER — Encounter (HOSPITAL_COMMUNITY)
Admission: RE | Admit: 2022-04-28 | Discharge: 2022-04-28 | Disposition: A | Discharge status: Home / Self Care | Payer: PPO | Source: Ambulatory Visit | Attending: Neurosurgery | Admitting: Neurosurgery

## 2022-04-28 VITALS — BP 108/85 | HR 38 | Temp 97.7°F | Resp 17 | Ht 63.0 in | Wt 134.9 lb

## 2022-04-28 DIAGNOSIS — Z01818 Encounter for other preprocedural examination: Secondary | ICD-10-CM

## 2022-04-28 DIAGNOSIS — Z01812 Encounter for preprocedural laboratory examination: Secondary | ICD-10-CM | POA: Insufficient documentation

## 2022-04-28 DIAGNOSIS — I4891 Unspecified atrial fibrillation: Secondary | ICD-10-CM | POA: Diagnosis not present

## 2022-04-28 DIAGNOSIS — I340 Nonrheumatic mitral (valve) insufficiency: Secondary | ICD-10-CM | POA: Diagnosis not present

## 2022-04-28 LAB — SURGICAL PCR SCREEN
MRSA, PCR: NEGATIVE
Staphylococcus aureus: NEGATIVE

## 2022-04-28 LAB — BASIC METABOLIC PANEL
Anion gap: 7 (ref 5–15)
BUN: 18 mg/dL (ref 8–23)
CO2: 25 mmol/L (ref 22–32)
Calcium: 9 mg/dL (ref 8.9–10.3)
Chloride: 108 mmol/L (ref 98–111)
Creatinine, Ser: 0.8 mg/dL (ref 0.44–1.00)
GFR, Estimated: >60 mL/min (ref >60)
Glucose, Bld: 103 mg/dL — ABNORMAL HIGH (ref 70–99)
Potassium: 4 mmol/L (ref 3.5–5.1)
Sodium: 140 mmol/L (ref 135–145)

## 2022-04-28 LAB — CBC
HCT: 41.2 % (ref 36.0–46.0)
Hemoglobin: 14 g/dL (ref 12.0–15.0)
MCH: 34.1 pg — ABNORMAL HIGH (ref 26.0–34.0)
MCHC: 34 g/dL (ref 30.0–36.0)
MCV: 100.2 fL — ABNORMAL HIGH (ref 80.0–100.0)
Platelets: 209 10*3/uL (ref 150–400)
RBC: 4.11 MIL/uL (ref 3.87–5.11)
RDW: 13 % (ref 11.5–15.5)
WBC: 7.2 10*3/uL (ref 4.0–10.5)
nRBC: 0 % (ref 0.0–0.2)

## 2022-04-28 NOTE — Progress Notes (Signed)
HPI: [name] is a 76 y.o. female, who is here today with her husband for her routine physical.  Last CPE: 12/30/16.  Regular exercise: She does not have an exercise routine. Active with chores around the house. Following a healthful diet: Cooks at home, juice and fruit with breakfast, potatoes and rice with lunch,and a light dinner. Salads daily, low salt diet.  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  Immunization History  Administered Date(s) Administered   Fluad Quad(high Dose 65+) 11/02/2019   Influenza Split 10/13/2011, 08/24/2012   Influenza Whole 09/27/2010   Influenza, High Dose Seasonal PF 10/02/2017   Influenza,inj,Quad PF,6+ Mos 08/01/2013, 10/30/2015   Influenza-Unspecified 09/22/2016   PFIZER(Purple Top)SARS-COV-2 Vaccination 02/03/2020, 02/28/2020   Pneumococcal Conjugate-13 04/16/2015   Pneumococcal Polysaccharide-23 01/14/2012   Tetanus 01/14/2012   Health Maintenance  Topic Date Due   COVID-19 Vaccine (3 - Pfizer series) 04/24/2020   Zoster Vaccines- Shingrix (1 of 2) 11/28/2022 (Originally 06/04/1995)   DEXA SCAN  02/07/2023 (Originally 02/07/2021)   TETANUS/TDAP  02/07/2023 (Originally 01/13/2022)   INFLUENZA VACCINE  06/24/2022   Pneumonia Vaccine 64+ Years old  Completed   Hepatitis C Screening  Completed   HPV VACCINES  Aged Out   COLONOSCOPY (Pts 45-62yr Insurance coverage will need to be confirmed)  Discontinued    Lab Results  Component Value Date   HGBA1C 5.6 07/30/2021   HLD on Atorvastatin 40 mg daily.  Lab Results  Component Value Date   CHOL 164 07/30/2021   HDL 47 07/30/2021   LDLCALC 95 07/30/2021   LDLDIRECT 75.0 11/02/2019   TRIG 112 07/30/2021   CHOLHDL 3.5 07/30/2021   HTN on Metoprolol succinate 25 mg daily and Amlodipine 2.5 mg daily.  Lab Results  Component Value Date   CREATININE  0.80 04/28/2022   BUN 18 04/28/2022   NA 140 04/28/2022   K 4.0 04/28/2022   CL 108 04/28/2022   CO2 25 04/28/2022   Planning on cervical spine surgery on 05/06/22, her cardiologist has already cleared her for surgery.  Left 4th finger limitation of active ROM, no pain, snapping like sensation intermittently. Problem started a few weeks ago but getting worse. No hx of trauma. Her husband placed a popsicle wooden stick with tape around finger.  She needs a refill for Gabapentin, prescribed by neurologist to treat headaches. She takes Gabapentin 100 mg 2 caps at bedtime.  Review of Systems  Constitutional:  Negative for activity change, appetite change and fever.  HENT:  Negative for hearing loss, mouth sores, sore throat and trouble swallowing.   Eyes:  Negative for redness and visual disturbance.  Respiratory:  Negative for cough, shortness of breath and wheezing.   Cardiovascular:  Negative for chest pain, palpitations and leg swelling.  Gastrointestinal:  Negative for abdominal pain, nausea and vomiting.       No changes in bowel habits.  Endocrine: Negative for cold intolerance, heat intolerance, polydipsia, polyphagia and polyuria.  Genitourinary:  Negative for decreased urine volume, dysuria, hematuria, vaginal bleeding and vaginal discharge.  Musculoskeletal:  Positive for arthralgias and neck pain. Negative for gait problem.  Skin:  Negative for color change and rash.  Allergic/Immunologic: Negative for environmental allergies.  Neurological:  Negative for syncope and weakness.  Hematological:  Negative for adenopathy. Does not bruise/bleed easily.  Psychiatric/Behavioral:  Negative for confusion and sleep disturbance. The patient is not nervous/anxious.   All other systems reviewed  and are negative.  Current Outpatient Medications on File Prior to Visit  Medication Sig Dispense Refill   acetaminophen (TYLENOL) 650 MG CR tablet Take 650 mg by mouth every 8 (eight) hours as  needed for pain (Headache).     aspirin EC 81 MG EC tablet Take 1 tablet (81 mg total) by mouth daily. Swallow whole. 30 tablet 11   atorvastatin (LIPITOR) 40 MG tablet Take 1 tablet (40 mg total) by mouth daily. (Patient taking differently: Take 40 mg by mouth at bedtime.) 90 tablet 3   Cyanocobalamin (VITAMIN B-12) 2500 MCG SUBL Place 2,500 mcg under the tongue daily.     enoxaparin (LOVENOX) 60 MG/0.6ML injection Inject 0.6 mLs (60 mg total) into the skin every 12 (twelve) hours. 12 mL 1   loperamide (IMODIUM A-D) 2 MG tablet Take 1 tablet (2 mg total) by mouth as needed for diarrhea or loose stools. 30 tablet 0   metoprolol succinate (TOPROL-XL) 25 MG 24 hr tablet TAKE ONE TABLET BY MOUTH EVERY MORNING and TAKE ONE TABLET BY MOUTH EVERYDAY AT BEDTIME 180 tablet 1   Peppermint Oil (IBGARD) 90 MG CPCR Use as needed (Patient taking differently: Take 90 mg by mouth daily as needed (Stomach control). Use as needed)     Polyethyl Glycol-Propyl Glycol (SYSTANE) 0.4-0.3 % SOLN Place 1 drop into both eyes 2 (two) times daily.     warfarin (COUMADIN) 2 MG tablet TAKE 1-2 TABLETS BY MOUTH ONCE DAILY OR AS PRESCRIBED BY COUMADIN CLINIC (Patient taking differently: Take 2-3 mg by mouth See admin instructions. Take 3 mg on Sun., Mon., Wed., and Fri All the other days take 2 mg   OR AS PRESCRIBED BY COUMADIN CLINIC) 60 tablet 1   amLODipine (NORVASC) 2.5 MG tablet Take 1 tablet (2.5 mg total) by mouth daily. Take 1 tablet in the evening (Patient taking differently: Take 2.5 mg by mouth every evening.) 90 tablet 3   amLODipine (NORVASC) 5 MG tablet Take 1 tablet (5 mg total) by mouth daily. (Patient taking differently: Take 5 mg by mouth every morning.) 180 tablet 3   No current facility-administered medications on file prior to visit.   Past Medical History:  Diagnosis Date   Arthritis    Atypical chest pain    Diverticulosis    Gastritis 11/08/2007, 04/12/2012   H pylori bx neg 03/2012   GERD  (gastroesophageal reflux disease) 11/08/2007, 04/12/2012   Hepatic cyst    Hiatal hernia 11/08/2007, 04/12/2012   History of kidney stones    Hyperlipidemia    Hypertension    IBS (irritable bowel syndrome)    diarrhea predom   Migraine    Mitral regurgitation    Osteoporosis    DEXA 01/21/2012: -2.1 spine and R fem, -1.8 L fem s/p fosamax  2004-2011 DEXA 04/18/14 @ [facility]: -2.5 - rec to start Prolia     PVC's (premature ventricular contractions)    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   Complex valvuloplasty including PTFE neochord placement x6 with 30 mm Medtronic Simuform ring annuloplasty with clipping of LA appendage   Schatzki's ring 11/11/2010   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   Shingles outbreak 04/2013    Past Surgical History:  Procedure Laterality Date   [place] STUDY  03/14/2021   Procedure: BUBBLE STUDY;  Surgeon: Sueanne Margarita, MD;  Location: [place];  Service: Cardiovascular;;   CARDIAC CATHETERIZATION     CLIPPING OF ATRIAL APPENDAGE N/A 05/21/2021   Procedure:  CLIPPING OF ATRIAL APPENDAGE USING ATRICURE 40MM PRO2 CLIP;  Surgeon: Rexene Alberts, MD;  Location: [place];  Service: Open Heart Surgery;  Laterality: N/A;   COLONOSCOPY     CYSTOCELE REPAIR  2008   ESOPHAGOGASTRODUODENOSCOPY     Maxilofacial  1992   MITRAL VALVE REPAIR  05/21/2021   Procedure: MINIMALLY INVASIVE MITRAL VALVE REPAIR (MVR) USING MEDTRONIC SIMUFORM 30MM RING;  Surgeon: Rexene Alberts, MD;  Location: [place];  Service: Open Heart Surgery;;   RIGHT/LEFT HEART CATH AND CORONARY ANGIOGRAPHY N/A 03/14/2021   Procedure: RIGHT/LEFT HEART CATH AND CORONARY ANGIOGRAPHY;  Surgeon: Lorretta Harp, MD;  Location: [place] CV LAB;  Service: Cardiovascular;  Laterality: N/A;   TEE WITHOUT CARDIOVERSION N/A 03/14/2021   Procedure: TRANSESOPHAGEAL ECHOCARDIOGRAM (TEE);  Surgeon: Sueanne Margarita, MD;  Location: [facility] ENDOSCOPY;  Service: Cardiovascular;   Laterality: N/A;   TEE WITHOUT CARDIOVERSION N/A 05/21/2021   Procedure: TRANSESOPHAGEAL ECHOCARDIOGRAM (TEE);  Surgeon: Rexene Alberts, MD;  Location: [place];  Service: Open Heart Surgery;  Laterality: N/A;   TONSILLECTOMY  1960   No Known Allergies  Family History  Problem Relation Age of Onset   Hypertension Mother    Alzheimer's disease Mother 33   AAA (abdominal aortic aneurysm) Mother    Stroke Father 45   Kidney cancer Brother        mets   Bone cancer Brother 5   Colon cancer Neg Hx     Social History   Socioeconomic History   Marital status: Married    Spouse name: [name] [name]   Number of children: 3   Years of education: Not on file   Highest education level: Master's degree (e.g., MA, MS, MEng, MEd, MSW, MBA)  Occupational History   Occupation: retired  Tobacco Use   Smoking status: Never   Smokeless tobacco: Never  Vaping Use   Vaping Use: Never used  Substance and Sexual Activity   Alcohol use: No   Drug use: No   Sexual activity: Not on file  Other Topics Concern   Not on file  Social History Narrative   Married, lives with spouse. Retired Licensed conveyancer, Print production planner. Has 3 kids (moved to [place] to be closer)- youngest son MD. Dorie Rank to [place] from [place] 06/2010, lived in [place] x 10years   Social Determinants of Health   Financial Resource Strain: [place]  (02/06/2022)   Overall Financial Resource Strain (CARDIA)    Difficulty of Paying Living Expenses: Not hard at all  Food Insecurity: No Food Insecurity (02/06/2022)   Hunger Vital Sign    Worried About Running Out of Food in the Last Year: Never true    [place] in the Last Year: Never true  Transportation Needs: No Transportation Needs (02/06/2022)   PRAPARE - Hydrologist (Medical): No    Lack of Transportation (Non-Medical): No  Physical Activity: Inactive (02/06/2022)   Exercise Vital Sign    Days of Exercise per Week: 0 days    Minutes of Exercise per Session: 0  min  Stress: No Stress Concern Present (02/06/2022)   [place]    Feeling of Stress : Not at all  Social Connections: [place] (02/06/2022)   Social Connection and Isolation Panel [NHANES]    Frequency of Communication with Friends and Family: More than three times a week    Frequency of Social Gatherings with Friends and Family: More than  three times a week    Attends Religious Services: More than 4 times per year    Active Member of Clubs or Organizations: Yes    Attends Archivist Meetings: More than 4 times per year    Marital Status: Married   Vitals:   04/29/22 0851  BP: 124/70  Pulse: 67  Resp: 16  SpO2: 97%   Body mass index is 23.9 kg/m.  Wt Readings from Last 3 Encounters:  04/29/22 134 lb 14.4 oz (61.2 kg)  04/28/22 134 lb 14.4 oz (61.2 kg)  03/10/22 135 lb (61.2 kg)   Physical Exam Vitals and nursing note reviewed.  Constitutional:      General: She is not in acute distress.    Appearance: She is well-developed.  HENT:     Head: Normocephalic and atraumatic.     Right Ear: Tympanic membrane, ear canal and external ear normal.     Left Ear: Tympanic membrane, ear canal and external ear normal.     Mouth/Throat:     Mouth: Mucous membranes are moist.     Pharynx: Oropharynx is clear. Uvula midline.  Eyes:     Extraocular Movements: Extraocular movements intact.     Conjunctiva/sclera: Conjunctivae normal.     Pupils: Pupils are equal, round, and reactive to light.  Neck:     Thyroid: No thyroid mass.     Trachea: No tracheal deviation.  Cardiovascular:     Rate and Rhythm: Normal rate. Rhythm irregular.     Pulses:          Posterior tibial pulses are 2+ on the right side and 2+ on the left side.     Heart sounds: No murmur heard. Pulmonary:     Effort: Pulmonary effort is normal. No respiratory distress.     Breath sounds: Normal breath sounds.  Abdominal:      Palpations: Abdomen is soft. There is no hepatomegaly or mass.     Tenderness: There is no abdominal tenderness.  Musculoskeletal:     Cervical back: Decreased range of motion.     Comments: Left 4th and 5th finger with limitation of active flexion and extension. No signs of synovitis.   Slight clicking sensation upon passive flexion and extension movement. No tenderness upon palpation.  Lymphadenopathy:     Cervical: No cervical adenopathy.  Skin:    General: Skin is warm.     Findings: No erythema or rash.  Neurological:     General: No focal deficit present.     Mental Status: She is alert and oriented to person, place, and time.     Cranial Nerves: No cranial nerve deficit.     Coordination: Coordination normal.     Gait: Gait normal.     Deep Tendon Reflexes:     Reflex Scores:      Bicep reflexes are 2+ on the right side and 2+ on the left side.      Patellar reflexes are 2+ on the right side and 2+ on the left side. Psychiatric:     Comments: Well groomed, good eye contact.   ASSESSMENT AND PLAN:  [name] was here today annual physical examination.  Orders Placed This Encounter  Procedures   Ambulatory referral to Sports Medicine   Routine general medical examination at a health care facility We discussed the importance of regular physical activity and healthy diet for prevention of chronic illness and/or complications. Preventive guidelines reviewed. Vaccination: She can get Shingrix at  her pharmacy after recovering from surgery. Ca++ and vit D supplementation recommended. Next CPE in a year.  Trigger finger of left hand, unspecified finger We discussed Dx, prognosis,and treatment options as well as differential dx. Sport medicine referral placed, she has seen Sr Smith.  Cervicogenic headache Gabapentin 100 mg 2 caps at bedtime to continue, refills sent. Following with neurologist.  -     gabapentin (NEURONTIN) 100 MG capsule; Take 2 capsules  (200 mg total) by mouth at bedtime.  Return in 6 months (on 10/29/2022).  Tracina Beaumont G. [place], MD  [facility]. [place] office.

## 2022-04-28 NOTE — Progress Notes (Signed)
PCP - [name] [name] Cardiologist - Quay Burow Last office visit 12/31/21  Chest x-ray - n/a EKG - 12/31/21 Stress Test - 2011 ECHO - 07/03/21 Cardiac Cath - 03/14/21   Blood Thinner Instructions: Last dose of Coumadin 04/30/22, per MD Patient stated she has started Lovenox bridge Aspirin Instructions: Hold 7 days prior, per MD   Anesthesia review: yes, heart history, received cardiac clearance on 03/28/22  Patient denies shortness of breath, fever, cough and chest pain at PAT appointment   All instructions explained to the patient, with a verbal understanding of the material. Patient agrees to go over the instructions while at home for a better understanding. Patient also instructed to self quarantine after being tested for COVID-19. The opportunity to ask questions was provided.

## 2022-04-29 ENCOUNTER — Ambulatory Visit (INDEPENDENT_AMBULATORY_CARE_PROVIDER_SITE_OTHER): Payer: PPO | Admitting: Family Medicine

## 2022-04-29 ENCOUNTER — Encounter: Payer: Self-pay | Admitting: Family Medicine

## 2022-04-29 VITALS — BP 124/70 | HR 67 | Resp 16 | Ht 63.0 in | Wt 134.9 lb

## 2022-04-29 DIAGNOSIS — M6530 Trigger finger, unspecified finger: Secondary | ICD-10-CM

## 2022-04-29 DIAGNOSIS — Z0000 Encounter for general adult medical examination without abnormal findings: Secondary | ICD-10-CM | POA: Diagnosis not present

## 2022-04-29 DIAGNOSIS — G4486 Cervicogenic headache: Secondary | ICD-10-CM

## 2022-04-29 MED ORDER — GABAPENTIN 100 MG PO CAPS: 200.0000 mg | ORAL_CAPSULE | Freq: Every day | ORAL | 2 refills | Status: DC

## 2022-04-29 NOTE — Progress Notes (Signed)
Anesthesia Chart Review:  Follows with cardiology for history of severe mitral regurgitation s/p minimally invasive mitral valve repair in 04/2021, post-op atrial fibrillation on Coumadin, occipital stroke in 07/2021, hypertension, hyperlipidemia.  She has normal coronaries by catheterization 02/2021.  She was seen 03/28/2022 by Sande Rives, PA-C via televisit for preop evaluation.  Per note, "Patient has upcoming spinal surgery (C3-4 C4-5 Anterior cervical decompression/discectomy/fusion) planned. She is stable from a cardiac standpoint with with no angina, shortness of breath, acute CHF symptoms, palpitations, syncope. She is able to complete >4.0 METS without any problems. Therefore, based on ACC/AHA guidelines, patient would be at acceptable risk for the planned procedure without further cardiovascular testing. Per Pharmacy she is OK to hold Coumadin but will need a Lovenox bridge. Our office will help arrange this. She was asked to notify our Coumadin Clinic as soon as she has a date for surgery so they can help arrange this. She thinks it will probably be in mid June. We were also asked to give our recommendations for holding Aspirin. Given she will be on a Lovenox bridge, it should be OK to hold Aspirin for 7 days prior to procedure. I will confirm this with Dr. Gwenlyn Found and if he would prefer her to remain on Aspirin, I will update both patient and requesting surgeon's office.  I will route this recommendation to the requesting party via Epic fax function."  Patient reported last dose Coumadin 04/30/2022.  She is on a Lovenox bridge.  Preop labs reviewed, unremarkable.  EKG 12/31/2021: Sinus rhythm with PACs in a pattern of bigeminy.  Rate 81.  Event monitor 10/23/2021: Patient had a min HR of 60 bpm, max HR of 187 bpm, and avg HR of 87 bpm. Predominant underlying rhythm was Sinus Rhythm. 1 run of Ventricular Tachycardia occurred lasting 6 beats with a max rate of 187 bpm (avg 172 bpm). Isolated SVEs were  frequent  (39.0%, [id number]), SVE Couplets were rare (<1.0%, 840), and SVE Triplets were rare (<1.0%, 1427). Isolated VEs were rare (<1.0%, 12055), VE Couplets were rare (<1.0%, 134), and VE Triplets were rare (<1.0%, 7).    1. SR/ST 2. Freq PACs and PVCs 3. Short runs of SVT and NSVT  TTE 07/03/2021:  1. Left ventricular ejection fraction, by estimation, is 40 to 45%. The  left ventricle has mildly decreased function. The left ventricle  demonstrates global hypokinesis. Left ventricular diastolic function could  not be evaluated.   2. Right ventricular systolic function is normal. The right ventricular  size is normal.   3. The mitral valve has been repaired/replaced. No evidence of mitral  valve regurgitation. No evidence of mitral stenosis. The mean mitral valve  gradient is 3.0 mmHg with average heart rate of 77 bpm. Procedure Date:  05/21/2021.   4. The aortic valve is tricuspid. Aortic valve regurgitation is not  visualized. No aortic stenosis is present.   5. The inferior vena cava is normal in size with greater than 50%  respiratory variability, suggesting right atrial pressure of 3 mmHg.   Comparison(s): Prior images reviewed side by side. The left ventricular  function is worsened.  Cath 03/14/2021: IMPRESSION: Ms. Hollen has a flail posterior leaflet with severe MR, clean coronary arteries with normal LV function and a elevated V wave.  She is symptomatic.  She is a great candidate for a "mini mitral" repair.  I am referring her to Dr. Lilly Cove for consideration of this.  The radial sheath was removed and a TR band  was placed on the right wrist to achieve patent hemostasis.  The patient left lab in stable condition.  She will be discharged home later today as an outpatient and will follow up with me in the office next week to discuss her options and timing.    Wynonia Musty [facility] Short Stay Center/Anesthesiology Phone [phone] 04/29/2022 1:23 PM

## 2022-04-29 NOTE — Anesthesia Preprocedure Evaluation (Addendum)
Anesthesia Evaluation  Patient identified by MRN, date of birth, ID band Patient awake    Reviewed: Allergy & Precautions, NPO status , Patient's Chart, lab work & pertinent test results, reviewed documented beta blocker date and time   Airway Mallampati: III  TM Distance: >3 FB Neck ROM: Full    Dental no notable dental hx. (+) Teeth Intact, Dental Advisory Given   Pulmonary neg pulmonary ROS,    Pulmonary exam normal breath sounds clear to auscultation       Cardiovascular hypertension, Pt. on medications and Pt. on home beta blockers Normal cardiovascular exam+ dysrhythmias Atrial Fibrillation + Valvular Problems/Murmurs (s/p MV repair 2022) MR  Rhythm:Regular Rate:Normal  EKG 12/31/2021: Sinus rhythm with PACs in a pattern of bigeminy.  Rate 81.  Event monitor 10/23/2021: Patient had a min HR of 60 bpm, max HR of 187 bpm, and avg HR of 87 bpm. Predominant underlying rhythm was Sinus Rhythm. 1 run of Ventricular Tachycardia occurred lasting 6 beats with a max rate of 187 bpm (avg 172 bpm). Isolated SVEs were frequent  (39.0%, [id number]), SVE Couplets were rare (<1.0%, 840), and SVE Triplets were rare (<1.0%, 1427). Isolated VEs were rare (<1.0%, 12055), VE Couplets were rare (<1.0%, 134), and VE Triplets were rare (<1.0%, 7).   1. SR/ST 2. Freq PACs and PVCs 3. Short runs of SVT and NSVT  TTE 07/03/2021: 1. Left ventricular ejection fraction, by estimation, is 40 to 45%. The  left ventricle has mildly decreased function. The left ventricle  demonstrates global hypokinesis. Left ventricular diastolic function could  not be evaluated.  2. Right ventricular systolic function is normal. The right ventricular  size is normal.  3. The mitral valve has been repaired/replaced. No evidence of mitral  valve regurgitation. No evidence of mitral stenosis. The mean mitral valve  gradient is 3.0 mmHg with average heart rate of 77 bpm.  Procedure Date:  05/21/2021.  4. The aortic valve is tricuspid. Aortic valve regurgitation is not  visualized. No aortic stenosis is present.  5. The inferior vena cava is normal in size with greater than 50%  respiratory variability, suggesting right atrial pressure of 3 mmHg.   Comparison(s): Prior images reviewed side by side. The left ventricular  function is worsened.  Cath 03/14/2021: IMPRESSION:Ms. Fanguy has a flail posterior leaflet with severe MR, clean coronary arteries with normal LV function and a elevated V wave. She is symptomatic. She is a great candidate for a "mini mitral" repair. I am referring her to Dr. Lilly Cove for consideration of this. The radial sheath was removed and a TR band was placed on the right wrist to achieve patent hemostasis. The patient left lab in stable condition. She will be discharged home later today as an outpatient and will follow up with me in the office next week to discuss her options and timing.   Neuro/Psych  Headaches, CVA, No Residual Symptoms negative psych ROS   GI/Hepatic Neg liver ROS, hiatal hernia, GERD  ,  Endo/Other  negative endocrine ROS  Renal/GU negative Renal ROS  negative genitourinary   Musculoskeletal  (+) Arthritis ,   Abdominal   Peds  Hematology  (+) Blood dyscrasia (coumadin, last dose 6/7, lovenox bridge, last dose sunday), ,   Anesthesia Other Findings   Reproductive/Obstetrics                           Anesthesia Physical Anesthesia Plan  ASA: 3  Anesthesia  Plan: General   Post-op Pain Management: Tylenol PO (pre-op)*   Induction: Intravenous  PONV Risk Score and Plan: 3 and Dexamethasone, Ondansetron and Treatment may vary due to age or medical condition  Airway Management Planned: Oral ETT  Additional Equipment:   Intra-op Plan:   Post-operative Plan: Extubation in OR  Informed Consent: I have reviewed the patients History and Physical, chart, labs and  discussed the procedure including the risks, benefits and alternatives for the proposed anesthesia with the patient or authorized representative who has indicated his/her understanding and acceptance.     Dental advisory given  Plan Discussed with: CRNA  Anesthesia Plan Comments:       Anesthesia Quick Evaluation

## 2022-04-29 NOTE — Patient Instructions (Addendum)
A few things to remember from today's visit:  Routine general medical examination at a health care facility  Trigger finger of left hand, unspecified finger - Plan: Ambulatory referral to Sports Medicine   Cuando se recupere de la ciruga puede ir a la farmacia para ponerse la vacuna de la culebrilla (shingrix).El colesterol lo podemos evaluar en septiembre. Please be sure medication list is accurate. If a new problem present, please set up appointment sooner than planned today.  Cuidados preventivos en las mujeres a Proofreader de los 43 aos de edad Preventive Care 19 Years and Older, Female Los cuidados preventivos hacen referencia a las opciones en cuanto al estilo de vida y a las visitas al mdico, las cuales pueden promover la salud y Musician. Las visitas de cuidado preventivo tambin se denominan exmenes de Therapist, sports. Qu puedo esperar para mi visita de cuidado preventivo? Asesoramiento Su mdico puede preguntarle acerca de: Antecedentes mdicos, incluidos los siguientes: Problemas mdicos pasados. Antecedentes mdicos familiares. Embarazo y antecedentes menstruales. Sus antecedentes de cadas. Salud actual, incluido lo siguiente: Memoria y capacidad de comprensin (capacidad intelectual). Su bienestar emocional. Restaurant manager, fast food y las relaciones personales. Actividad sexual y salud sexual. Kary Kos de vida, incluido lo siguiente: Consumo de alcohol, nicotina, tabaco o drogas. Acceso a armas de fuego. Hbitos de alimentacin, ejercicio y sueo. Su trabajo y [place] laboral. Uso de [place] solar. Cuestiones de seguridad, como el uso de cinturn de seguridad y casco de Secondary school teacher. Examen fsico El mdico revisar lo siguiente: IT consultant y [place]. Estos pueden usarse para calcular el IMC (ndice de masa corporal). El [facility] es una medicin que indica si tiene un peso saludable. Circunferencia de la cintura. Es Ardelia Mems medicin alrededor de Science writer. Esta medicin tambin indica  si tiene un peso saludable y puede ayudar a predecir su riesgo de padecer ciertas enfermedades, como diabetes tipo 2 y presin arterial alta. Frecuencia cardaca y presin arterial. Temperatura corporal. Piel para detectar manchas anormales. Qu vacunas necesito?  Las vacunas se aplican a varias edades, segn un cronograma. El [employer] recomendar vacunas segn su edad, sus antecedentes mdicos, su estilo de vida y otros factores, como los viajes o el lugar donde trabaja. Qu pruebas necesito? Pruebas de deteccin El mdico puede recomendar pruebas de deteccin de ciertas afecciones. Esto puede incluir: Niveles de lpidos y colesterol. Prueba de hepatitis C. Prueba de hepatitis B. Prueba del VIH (virus de inmunodeficiencia humana). Pruebas de infecciones de transmisin sexual (ITS), si est en riesgo. Pruebas de deteccin de cncer de pulmn. Pruebas de deteccin de cncer colorrectal. Pruebas de deteccin de la diabetes. Esto se Set designer un control del azcar en la sangre (glucosa) despus de no haber comido durante un periodo de tiempo (ayuno). Mamografa. Hable con el mdico sobre la frecuencia con la que debe realizarse [place]. Pruebas de deteccin de cncer relacionado con las mutaciones del BRCA. Es posible que se las deba realizar si tiene antecedentes de cncer de mama, de ovario, de trompas o peritoneal. Densitometra sea. Esto se realiza para detectar osteoporosis. Hable con su mdico [employer], las opciones de tratamiento y, si corresponde, la necesidad de Optometrist ms pruebas. Siga estas instrucciones en su casa: Comida y bebida  Siga una dieta que incluya frutas y verduras frescas, cereales integrales, protenas magras y productos lcteos descremados. Limite el consumo de alimentos con alto contenido de Location manager, grasas saturadas y sal. Tome los suplementos vitamnicos y Optometrist como se lo haya indicado el mdico. No beba  alcohol si  el mdico se lo prohbe. Si bebe alcohol: Limite la cantidad que consume de 0 a 1 medida por da. Sepa cunta cantidad de alcohol hay en las bebidas que toma. En los [place], una medida equivale a una botella de cerveza de 12 oz (355 ml), un vaso de vino de 5 oz (148 ml) o un vaso de una bebida alcohlica de alta graduacin de 1 oz (44 ml). Estilo de [employer] dientes a la maana y a la noche con Social research officer, government con fluoruro. Use hilo dental una vez al da. Haga al menos 30 minutos de ejercicio, 5 o ms das cada semana. No consuma ningn producto que contenga nicotina o tabaco. Estos productos incluyen cigarrillos, tabaco para Higher education careers adviser y aparatos de vapeo, como los Psychologist, sport and exercise. Si necesita ayuda para dejar de fumar, consulte al mdico. No consuma drogas. Si es sexualmente activa, practique sexo seguro. Use un condn u otra forma de proteccin a fin de prevenir las infecciones de transmisin sexual (ITS). Tome aspirina nicamente como se lo haya indicado el mdico. Asegrese de que comprende qu cantidad y cul presentacin debe tomar. Trabaje con el mdico para averiguar si es seguro y beneficioso para usted tomar aspirina a diario. Pregntele al mdico si necesita tomar un medicamento para reducir Freight forwarder (estatina). Busque maneras saludables de Scientific laboratory technician, tales como: Meditacin, yoga o Conservation officer, nature. Lleve un diario personal. Hable con una persona confiable. Pase tiempo con amigos y familiares. Minimice la exposicin a la radiacin UV para reducir el riesgo de cncer de piel. Seguridad [place] siempre el cinturn de seguridad al conducir o viajar en un vehculo. No conduzca: Si ha estado bebiendo alcohol. No viaje con un conductor que ha estado bebiendo. Si est cansada o distrada. Mientras est enviando mensajes de texto. Si ha estado usando sustancias o drogas que alteran la funcin mental. Use un casco y otros equipos de proteccin durante las  actividades deportivas. Si tiene armas de fuego en su casa, asegrese de seguir todos los procedimientos de seguridad correspondientes. Cundo volver? Visite al mdico una vez al ao para una visita anual de control de bienestar. Pregntele al mdico con qu frecuencia debe realizarse un control de la vista y los dientes. Mantenga su esquema de vacunacin al da. Esta informacin no tiene Marine scientist el consejo del mdico. Asegrese de hacerle al mdico cualquier pregunta que tenga. Document Revised: 05/29/2021 Document Reviewed: 05/29/2021 Elsevier Patient Education  [place].

## 2022-05-06 ENCOUNTER — Ambulatory Visit (HOSPITAL_COMMUNITY): Payer: PPO

## 2022-05-06 ENCOUNTER — Observation Stay (HOSPITAL_COMMUNITY)
Admission: RE | Admit: 2022-05-06 | Discharge: 2022-05-07 | Disposition: A | Discharge status: Home / Self Care | Payer: PPO | Source: Ambulatory Visit | Attending: Neurosurgery | Admitting: Neurosurgery

## 2022-05-06 ENCOUNTER — Ambulatory Visit (HOSPITAL_BASED_OUTPATIENT_CLINIC_OR_DEPARTMENT_OTHER): Payer: PPO | Admitting: Certified Registered Nurse Anesthetist

## 2022-05-06 ENCOUNTER — Ambulatory Visit (HOSPITAL_COMMUNITY)
Admission: RE | Disposition: A | Discharge status: Home / Self Care | Payer: Self-pay | Source: Ambulatory Visit | Attending: Neurosurgery

## 2022-05-06 ENCOUNTER — Other Ambulatory Visit: Payer: Self-pay

## 2022-05-06 ENCOUNTER — Encounter (HOSPITAL_COMMUNITY): Payer: Self-pay | Admitting: Neurosurgery

## 2022-05-06 ENCOUNTER — Ambulatory Visit (HOSPITAL_COMMUNITY): Payer: PPO | Admitting: Physician Assistant

## 2022-05-06 DIAGNOSIS — M5001 Cervical disc disorder with myelopathy,  high cervical region: Secondary | ICD-10-CM | POA: Diagnosis not present

## 2022-05-06 DIAGNOSIS — M532X2 Spinal instabilities, cervical region: Secondary | ICD-10-CM

## 2022-05-06 DIAGNOSIS — M50021 Cervical disc disorder at C4-C5 level with myelopathy: Secondary | ICD-10-CM | POA: Diagnosis not present

## 2022-05-06 DIAGNOSIS — Z01818 Encounter for other preprocedural examination: Secondary | ICD-10-CM

## 2022-05-06 DIAGNOSIS — I4891 Unspecified atrial fibrillation: Secondary | ICD-10-CM

## 2022-05-06 DIAGNOSIS — M4310 Spondylolisthesis, site unspecified: Secondary | ICD-10-CM | POA: Diagnosis present

## 2022-05-06 DIAGNOSIS — M4312 Spondylolisthesis, cervical region: Secondary | ICD-10-CM | POA: Diagnosis not present

## 2022-05-06 DIAGNOSIS — G992 Myelopathy in diseases classified elsewhere: Secondary | ICD-10-CM | POA: Diagnosis not present

## 2022-05-06 DIAGNOSIS — I10 Essential (primary) hypertension: Secondary | ICD-10-CM | POA: Diagnosis not present

## 2022-05-06 DIAGNOSIS — Z981 Arthrodesis status: Secondary | ICD-10-CM | POA: Diagnosis not present

## 2022-05-06 DIAGNOSIS — G959 Disease of spinal cord, unspecified: Secondary | ICD-10-CM | POA: Diagnosis not present

## 2022-05-06 DIAGNOSIS — Z79899 Other long term (current) drug therapy: Secondary | ICD-10-CM | POA: Diagnosis not present

## 2022-05-06 DIAGNOSIS — Z7901 Long term (current) use of anticoagulants: Secondary | ICD-10-CM | POA: Diagnosis not present

## 2022-05-06 DIAGNOSIS — Z7982 Long term (current) use of aspirin: Secondary | ICD-10-CM | POA: Insufficient documentation

## 2022-05-06 HISTORY — DX: Spondylolisthesis, site unspecified: M43.10

## 2022-05-06 HISTORY — PX: ANTERIOR CERVICAL DECOMP/DISCECTOMY FUSION: SHX1161

## 2022-05-06 LAB — PROTIME-INR
INR: 1.1 (ref 0.8–1.2)
Prothrombin Time: 14.4 seconds (ref 11.4–15.2)

## 2022-05-06 SURGERY — ANTERIOR CERVICAL DECOMPRESSION/DISCECTOMY FUSION 2 LEVELS
Anesthesia: General | Site: Spine Cervical

## 2022-05-06 MED ORDER — METOPROLOL SUCCINATE ER 25 MG PO TB24: 25.0000 mg | ORAL_TABLET | Freq: Every day | ORAL | Status: DC

## 2022-05-06 MED ORDER — CHLORHEXIDINE GLUCONATE CLOTH 2 % EX PADS: 6.0000 | MEDICATED_PAD | Freq: Once | CUTANEOUS | Status: DC

## 2022-05-06 MED ORDER — ROCURONIUM BROMIDE 10 MG/ML (PF) SYRINGE
PREFILLED_SYRINGE | INTRAVENOUS | Status: DC | PRN
  Administered 2022-05-06: 60 mg via INTRAVENOUS

## 2022-05-06 MED ORDER — SODIUM CHLORIDE 0.9% FLUSH: 3.0000 mL | INTRAVENOUS | Status: DC | PRN

## 2022-05-06 MED ORDER — CYCLOBENZAPRINE HCL 10 MG PO TABS
10.0000 mg | ORAL_TABLET | Freq: Three times a day (TID) | ORAL | Status: DC | PRN
  Administered 2022-05-06: 10 mg via ORAL
  Filled 2022-05-06: qty 1

## 2022-05-06 MED ORDER — THROMBIN 5000 UNITS EX SOLR: OROMUCOSAL | Status: DC | PRN

## 2022-05-06 MED ORDER — FENTANYL CITRATE (PF) 250 MCG/5ML IJ SOLN
INTRAMUSCULAR | Status: DC | PRN
Start: 2022-05-06 — End: 2022-05-06
  Administered 2022-05-06: 100 ug via INTRAVENOUS
  Administered 2022-05-06: 50 ug via INTRAVENOUS

## 2022-05-06 MED ORDER — CEFAZOLIN SODIUM-DEXTROSE 2-4 GM/100ML-% IV SOLN
2.0000 g | INTRAVENOUS | Status: AC
  Administered 2022-05-06: 2 g via INTRAVENOUS
  Filled 2022-05-06: qty 100

## 2022-05-06 MED ORDER — ACETAMINOPHEN 650 MG RE SUPP: 650.0000 mg | RECTAL | Status: DC | PRN

## 2022-05-06 MED ORDER — AMLODIPINE BESYLATE 5 MG PO TABS
5.0000 mg | ORAL_TABLET | Freq: Every morning | ORAL | Status: DC
  Administered 2022-05-07: 5 mg via ORAL
  Filled 2022-05-06: qty 1

## 2022-05-06 MED ORDER — DEXAMETHASONE SODIUM PHOSPHATE 10 MG/ML IJ SOLN
INTRAMUSCULAR | Status: AC
  Filled 2022-05-06: qty 1

## 2022-05-06 MED ORDER — ACETAMINOPHEN 500 MG PO TABS
1000.0000 mg | ORAL_TABLET | Freq: Once | ORAL | Status: AC
  Administered 2022-05-06: 1000 mg via ORAL
  Filled 2022-05-06: qty 2

## 2022-05-06 MED ORDER — ACETAMINOPHEN 325 MG PO TABS
650.0000 mg | ORAL_TABLET | ORAL | Status: DC | PRN
  Administered 2022-05-06: 650 mg via ORAL
  Filled 2022-05-06: qty 2

## 2022-05-06 MED ORDER — 0.9 % SODIUM CHLORIDE (POUR BTL) OPTIME
TOPICAL | Status: DC | PRN
  Administered 2022-05-06: 1000 mL

## 2022-05-06 MED ORDER — THROMBIN 5000 UNITS EX SOLR
CUTANEOUS | Status: AC
  Filled 2022-05-06: qty 10000

## 2022-05-06 MED ORDER — THROMBIN 5000 UNITS EX SOLR
CUTANEOUS | Status: AC
  Filled 2022-05-06: qty 5000

## 2022-05-06 MED ORDER — MENTHOL 3 MG MT LOZG
1.0000 | LOZENGE | OROMUCOSAL | Status: DC | PRN
  Filled 2022-05-06: qty 9

## 2022-05-06 MED ORDER — SUGAMMADEX SODIUM 200 MG/2ML IV SOLN
INTRAVENOUS | Status: DC | PRN
  Administered 2022-05-06: 125 mg via INTRAVENOUS

## 2022-05-06 MED ORDER — DEXAMETHASONE SODIUM PHOSPHATE 10 MG/ML IJ SOLN
INTRAMUSCULAR | Status: DC | PRN
  Administered 2022-05-06: 10 mg via INTRAVENOUS

## 2022-05-06 MED ORDER — ONDANSETRON HCL 4 MG/2ML IJ SOLN
INTRAMUSCULAR | Status: AC
Start: 2022-05-06 — End: ?
  Filled 2022-05-06: qty 2

## 2022-05-06 MED ORDER — CHLORHEXIDINE GLUCONATE 0.12 % MT SOLN
15.0000 mL | Freq: Once | OROMUCOSAL | Status: AC
  Administered 2022-05-06: 15 mL via OROMUCOSAL
  Filled 2022-05-06: qty 15

## 2022-05-06 MED ORDER — FENTANYL CITRATE (PF) 250 MCG/5ML IJ SOLN
INTRAMUSCULAR | Status: AC
  Filled 2022-05-06: qty 5

## 2022-05-06 MED ORDER — PHENYLEPHRINE 80 MCG/ML (10ML) SYRINGE FOR IV PUSH (FOR BLOOD PRESSURE SUPPORT)
PREFILLED_SYRINGE | INTRAVENOUS | Status: AC
  Filled 2022-05-06: qty 10

## 2022-05-06 MED ORDER — PHENOL 1.4 % MT LIQD
1.0000 | OROMUCOSAL | Status: DC | PRN
  Filled 2022-05-06: qty 177

## 2022-05-06 MED ORDER — FENTANYL CITRATE (PF) 100 MCG/2ML IJ SOLN: 25.0000 ug | INTRAMUSCULAR | Status: DC | PRN

## 2022-05-06 MED ORDER — ORAL CARE MOUTH RINSE: 15.0000 mL | Freq: Once | OROMUCOSAL | Status: AC

## 2022-05-06 MED ORDER — PROPOFOL 10 MG/ML IV BOLUS
INTRAVENOUS | Status: DC | PRN
  Administered 2022-05-06: 90 mg via INTRAVENOUS

## 2022-05-06 MED ORDER — LIDOCAINE 2% (20 MG/ML) 5 ML SYRINGE
INTRAMUSCULAR | Status: DC | PRN
  Administered 2022-05-06: 40 mg via INTRAVENOUS

## 2022-05-06 MED ORDER — ROCURONIUM BROMIDE 10 MG/ML (PF) SYRINGE
PREFILLED_SYRINGE | INTRAVENOUS | Status: AC
Start: 2022-05-06 — End: ?
  Filled 2022-05-06: qty 10

## 2022-05-06 MED ORDER — PROPOFOL 10 MG/ML IV BOLUS
INTRAVENOUS | Status: AC
  Filled 2022-05-06: qty 20

## 2022-05-06 MED ORDER — THROMBIN (RECOMBINANT) 5000 UNITS EX SOLR: CUTANEOUS | Status: DC | PRN

## 2022-05-06 MED ORDER — ONDANSETRON HCL 4 MG PO TABS: 4.0000 mg | ORAL_TABLET | Freq: Four times a day (QID) | ORAL | Status: DC | PRN

## 2022-05-06 MED ORDER — ONDANSETRON HCL 4 MG/2ML IJ SOLN
INTRAMUSCULAR | Status: DC | PRN
  Administered 2022-05-06: 4 mg via INTRAVENOUS

## 2022-05-06 MED ORDER — ASPIRIN 81 MG PO TBEC
81.0000 mg | DELAYED_RELEASE_TABLET | Freq: Every day | ORAL | Status: DC
  Administered 2022-05-06 – 2022-05-07 (×2): 81 mg via ORAL
  Filled 2022-05-06 (×2): qty 1

## 2022-05-06 MED ORDER — LIDOCAINE 2% (20 MG/ML) 5 ML SYRINGE
INTRAMUSCULAR | Status: AC
  Filled 2022-05-06: qty 5

## 2022-05-06 MED ORDER — HYDROCODONE-ACETAMINOPHEN 10-325 MG PO TABS: 1.0000 | ORAL_TABLET | ORAL | Status: DC | PRN

## 2022-05-06 MED ORDER — VITAMIN B-12 100 MCG PO TABS
2500.0000 ug | ORAL_TABLET | Freq: Every day | ORAL | Status: DC
Start: 2022-05-06 — End: 2022-05-07

## 2022-05-06 MED ORDER — HYDROCODONE-ACETAMINOPHEN 5-325 MG PO TABS
1.0000 | ORAL_TABLET | ORAL | Status: DC | PRN
  Administered 2022-05-06 (×2): 1 via ORAL
  Filled 2022-05-06 (×3): qty 1

## 2022-05-06 MED ORDER — SODIUM CHLORIDE 0.9 % IV SOLN
250.0000 mL | INTRAVENOUS | Status: DC
  Administered 2022-05-06: 250 mL via INTRAVENOUS

## 2022-05-06 MED ORDER — HYDROMORPHONE HCL 1 MG/ML IJ SOLN: 1.0000 mg | INTRAMUSCULAR | Status: DC | PRN

## 2022-05-06 MED ORDER — SODIUM CHLORIDE 0.9% FLUSH
3.0000 mL | Freq: Two times a day (BID) | INTRAVENOUS | Status: DC
  Administered 2022-05-06 (×2): 3 mL via INTRAVENOUS

## 2022-05-06 MED ORDER — CEFAZOLIN SODIUM-DEXTROSE 1-4 GM/50ML-% IV SOLN
1.0000 g | Freq: Three times a day (TID) | INTRAVENOUS | Status: AC
  Administered 2022-05-06 – 2022-05-07 (×2): 1 g via INTRAVENOUS
  Filled 2022-05-06 (×2): qty 50

## 2022-05-06 MED ORDER — POLYVINYL ALCOHOL 1.4 % OP SOLN
1.0000 [drp] | Freq: Two times a day (BID) | OPHTHALMIC | Status: DC
Start: 2022-05-06 — End: 2022-05-07
  Filled 2022-05-06: qty 15

## 2022-05-06 MED ORDER — PHENYLEPHRINE 80 MCG/ML (10ML) SYRINGE FOR IV PUSH (FOR BLOOD PRESSURE SUPPORT)
PREFILLED_SYRINGE | INTRAVENOUS | Status: DC | PRN
  Administered 2022-05-06: 160 ug via INTRAVENOUS

## 2022-05-06 MED ORDER — ONDANSETRON HCL 4 MG/2ML IJ SOLN: 4.0000 mg | Freq: Four times a day (QID) | INTRAMUSCULAR | Status: DC | PRN

## 2022-05-06 MED ORDER — PHENYLEPHRINE HCL-NACL 20-0.9 MG/250ML-% IV SOLN
INTRAVENOUS | Status: DC | PRN
  Administered 2022-05-06: 25 ug/min via INTRAVENOUS

## 2022-05-06 MED ORDER — ATORVASTATIN CALCIUM 40 MG PO TABS
40.0000 mg | ORAL_TABLET | Freq: Every day | ORAL | Status: DC
  Administered 2022-05-06: 40 mg via ORAL
  Filled 2022-05-06: qty 1

## 2022-05-06 MED ORDER — GABAPENTIN 100 MG PO CAPS
200.0000 mg | ORAL_CAPSULE | Freq: Every day | ORAL | Status: DC
  Administered 2022-05-06: 200 mg via ORAL
  Filled 2022-05-06: qty 2

## 2022-05-06 MED ORDER — LOPERAMIDE HCL 2 MG PO TABS: 2.0000 mg | ORAL_TABLET | ORAL | Status: DC | PRN

## 2022-05-06 MED ORDER — LOPERAMIDE HCL 2 MG PO CAPS
2.0000 mg | ORAL_CAPSULE | ORAL | Status: DC | PRN
Start: 2022-05-06 — End: 2022-05-07

## 2022-05-06 MED ORDER — AMLODIPINE BESYLATE 5 MG PO TABS
2.5000 mg | ORAL_TABLET | Freq: Every evening | ORAL | Status: DC
  Administered 2022-05-06: 2.5 mg via ORAL
  Filled 2022-05-06: qty 1

## 2022-05-06 MED ORDER — LACTATED RINGERS IV SOLN: INTRAVENOUS | Status: DC

## 2022-05-06 MED ORDER — VITAMIN B-12 2500 MCG SL SUBL
2500.0000 ug | SUBLINGUAL_TABLET | Freq: Every day | SUBLINGUAL | Status: DC
Start: 2022-05-06 — End: 2022-05-06

## 2022-05-06 SURGICAL SUPPLY — 53 items
BAG COUNTER SPONGE SURGICOUNT (BAG) ×2 IMPLANT
BAG DECANTER FOR FLEXI CONT (MISCELLANEOUS) ×2 IMPLANT
BAND RUBBER #18 3X1/16 STRL (MISCELLANEOUS) ×4 IMPLANT
BENZOIN TINCTURE PRP APPL 2/3 (GAUZE/BANDAGES/DRESSINGS) ×2 IMPLANT
BUR MATCHSTICK NEURO 3.0 LAGG (BURR) ×2 IMPLANT
CAGE PEEK 6X14X11 (Cage) ×2 IMPLANT
CANISTER SUCT 3000ML PPV (MISCELLANEOUS) ×2 IMPLANT
CARTRIDGE OIL MAESTRO DRILL (MISCELLANEOUS) ×1 IMPLANT
DIFFUSER DRILL AIR PNEUMATIC (MISCELLANEOUS) ×2 IMPLANT
DRAPE C-ARM 42X72 X-RAY (DRAPES) ×4 IMPLANT
DRAPE LAPAROTOMY 100X72 PEDS (DRAPES) ×2 IMPLANT
DRAPE MICROSCOPE LEICA (MISCELLANEOUS) ×2 IMPLANT
DURAPREP 6ML APPLICATOR 50/CS (WOUND CARE) ×2 IMPLANT
ELECT COATED BLADE 2.86 ST (ELECTRODE) ×2 IMPLANT
ELECT REM PT RETURN 9FT ADLT (ELECTROSURGICAL) ×2
ELECTRODE REM PT RTRN 9FT ADLT (ELECTROSURGICAL) ×1 IMPLANT
GAUZE 4X4 16PLY ~~LOC~~+RFID DBL (SPONGE) IMPLANT
GAUZE SPONGE 4X4 12PLY STRL (GAUZE/BANDAGES/DRESSINGS) ×2 IMPLANT
GLOVE ECLIPSE 9.0 STRL (GLOVE) ×2 IMPLANT
GLOVE EXAM NITRILE XL STR (GLOVE) IMPLANT
GOWN STRL REUS W/ TWL LRG LVL3 (GOWN DISPOSABLE) IMPLANT
GOWN STRL REUS W/ TWL XL LVL3 (GOWN DISPOSABLE) ×1 IMPLANT
GOWN STRL REUS W/TWL 2XL LVL3 (GOWN DISPOSABLE) IMPLANT
GOWN STRL REUS W/TWL LRG LVL3 (GOWN DISPOSABLE)
GOWN STRL REUS W/TWL XL LVL3 (GOWN DISPOSABLE) ×1
HALTER HD/CHIN CERV TRACTION D (MISCELLANEOUS) ×2 IMPLANT
HEMOSTAT POWDER KIT SURGIFOAM (HEMOSTASIS) ×1 IMPLANT
HEMOSTAT SURGICEL 2X14 (HEMOSTASIS) IMPLANT
KIT BASIN OR (CUSTOM PROCEDURE TRAY) ×2 IMPLANT
KIT TURNOVER KIT B (KITS) ×2 IMPLANT
NDL SPNL 20GX3.5 QUINCKE YW (NEEDLE) ×1 IMPLANT
NEEDLE SPNL 20GX3.5 QUINCKE YW (NEEDLE) ×2 IMPLANT
NS IRRIG 1000ML POUR BTL (IV SOLUTION) ×2 IMPLANT
OIL CARTRIDGE MAESTRO DRILL (MISCELLANEOUS) ×2
PACK LAMINECTOMY NEURO (CUSTOM PROCEDURE TRAY) ×2 IMPLANT
PAD ARMBOARD 7.5X6 YLW CONV (MISCELLANEOUS) ×6 IMPLANT
PLATE VISION ELITE 40MM (Plate) ×1 IMPLANT
PUTTY DBX 2.5CC (Putty) ×2 IMPLANT
PUTTY DBX 2.5CC DEPUY (Putty) IMPLANT
SCREW ST FIX 4 ATL 3120213 (Screw) ×6 IMPLANT
SPACER SPNL 11X14X6XPEEK CVD (Cage) IMPLANT
SPCR SPNL 11X14X6XPEEK CVD (Cage) ×2 IMPLANT
SPONGE INTESTINAL PEANUT (DISPOSABLE) ×2 IMPLANT
SPONGE SURGIFOAM ABS GEL SZ50 (HEMOSTASIS) ×2 IMPLANT
STRIP CLOSURE SKIN 1/2X4 (GAUZE/BANDAGES/DRESSINGS) ×2 IMPLANT
SUT VIC AB 3-0 SH 8-18 (SUTURE) ×2 IMPLANT
SUT VIC AB 4-0 RB1 18 (SUTURE) ×2 IMPLANT
TAPE CLOTH 4X10 WHT NS (GAUZE/BANDAGES/DRESSINGS) ×2 IMPLANT
TAPE CLOTH SURG 4X10 WHT LF (GAUZE/BANDAGES/DRESSINGS) ×1 IMPLANT
TOWEL GREEN STERILE (TOWEL DISPOSABLE) ×2 IMPLANT
TOWEL GREEN STERILE FF (TOWEL DISPOSABLE) ×2 IMPLANT
TRAP SPECIMEN MUCUS 40CC (MISCELLANEOUS) ×2 IMPLANT
WATER STERILE IRR 1000ML POUR (IV SOLUTION) ×2 IMPLANT

## 2022-05-06 NOTE — Anesthesia Procedure Notes (Signed)
Procedure Name: Intubation Date/Time: 05/06/2022 11:11 AM  Performed by: Colin Benton, CRNAPre-anesthesia Checklist: Patient identified, Emergency Drugs available, Suction available and Patient being monitored Patient Re-evaluated:Patient Re-evaluated prior to induction Oxygen Delivery Method: Circle system utilized Preoxygenation: Pre-oxygenation with 100% oxygen Induction Type: IV induction Ventilation: Mask ventilation without difficulty Laryngoscope Size: Mac and 3 Grade View: Grade I Tube type: Oral Tube size: 7.0 mm Number of attempts: 1 Airway Equipment and Method: Stylet Placement Confirmation: ETT inserted through vocal cords under direct vision, positive ETCO2 and breath sounds checked- equal and bilateral Secured at: 20 cm Tube secured with: Tape Dental Injury: Teeth and Oropharynx as per pre-operative assessment

## 2022-05-06 NOTE — Anesthesia Postprocedure Evaluation (Signed)
Anesthesia Post Note  Patient: [name] [name]  Procedure(s) Performed: CERVICAL THREE-FOUR, CERVICAL FOUR-FIVE ANTERIOR CERVICAL DECOMPRESSION/DISCECTOMY FUSION (Spine Cervical)     Patient location during evaluation: PACU Anesthesia Type: General Level of consciousness: awake and alert Pain management: pain level controlled Vital Signs Assessment: post-procedure vital signs reviewed and stable Respiratory status: spontaneous breathing, nonlabored ventilation, respiratory function stable and patient connected to nasal cannula oxygen Cardiovascular status: blood pressure returned to baseline and stable Postop Assessment: no apparent nausea or vomiting Anesthetic complications: no   No notable events documented.  Last Vitals:  Vitals:   05/06/22 1330 05/06/22 1345  BP: 107/89 129/62  Pulse: 82 83  Resp: 18 18  Temp:  36.6 C  SpO2: 94% 95%    Last Pain:  Vitals:   05/06/22 1345  TempSrc:   PainSc: 0-No pain                 Miley Lindon L Kileen Lange

## 2022-05-06 NOTE — Op Note (Signed)
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  Asst. Surgeon: Reinaldo Meeker, NP  Anesthesia: General  Indication: 76 year old female with progressively worsening neck pain with radiating pain and numbness in the both proximal upper extremities.  Patient also with significant headaches.  She has some numbness and dysesthesias in both hands and has some increasing gait instability.  Work-up demonstrates evidence of multilevel cervical degenerative disease with marked anterior listhesis upon flexion at C3-4 and C4-5 with associated severe facet arthropathy.  Patient presents now for two-level anterior cervical decompression and fusion.  Operative note: After induction of anesthesia, patient position supine with neck slightly extended and held in place halter traction.  Patient's anterior cervical region prepped and draped sterilely.  Incision made overlying C4.  Dissection on the right.  Retractor placed.  Fluoroscopy used.  Levels confirmed.  Disc spaces at C3-4 and C4-5 incised.  Discectomy was performed using various instruments down to level the posterior annulus.  Microscope then brought in field used throughout the remainder of the discectomy.  Remaining aspects of annulus and osteophytes removed using high-speed drill down to level the posterior longitudinal ligament.  Posterior logical was then elevated and resected.  Underlying thecal sac was identified.  A wide central decompression then performed by undercutting the bodies of C3 and C4.  Decompression then proceeded each neural foramina.  Wide anterior foraminotomies were  performed on course exiting C4 nerve roots bilaterally.  This point a very thorough decompression been achieved.  There was no evidence of injury to thecal sac and nerve roots.  Procedures then repeated at C4-5 again without complication.  Wound is then irrigated.  Gelfoam was placed topically for hemostasis then removed.  Medtronic anatomic peek cages were then packed with locally harvested autograft combined with demineralized bone matrix.  Each cage was then impacted in the place and recessed slightly from the anterior cortical margin.  [place] anterior cervical plate was then placed over the C3, C4 and C5 levels.  This then attached under fluoroscopic guidance using 13 mm fixed angle screws to each at all 3 levels.  All screws given final tightening found to be solidly within the bone.  Locking screws engaged all levels.  Final images reveal good position of cages and hardware proper upper level with normal alignment of spine.  Wound was then inspected for hemostasis which was found to be good.  Wounds then closed in layers of Vicryl sutures.  Steri-Strips and sterile dressing were applied.  No apparent complications.  Patient tolerated the procedure well and she returns to the recovery room postop.

## 2022-05-06 NOTE — Brief Op Note (Signed)
05/06/2022  12:35 PM  PATIENT:  [name] [name]  76 y.o. female  PRE-OPERATIVE DIAGNOSIS:  Spondylolisthesis  POST-OPERATIVE DIAGNOSIS:  Spondylolisthesis  PROCEDURE:  Procedure(s): CERVICAL THREE-FOUR, CERVICAL FOUR-FIVE ANTERIOR CERVICAL DECOMPRESSION/DISCECTOMY FUSION (N/A)  SURGEON:  Surgeon(s) and Role:    * Earnie Larsson, MD - Primary  PHYSICIAN ASSISTANT:   ASSISTANTSMearl Latin   ANESTHESIA:   general  EBL:  50 mL   BLOOD ADMINISTERED:none  DRAINS: none   LOCAL MEDICATIONS USED:  NONE  SPECIMEN:  No Specimen  DISPOSITION OF SPECIMEN:  N/A  COUNTS:  YES  TOURNIQUET:  * No tourniquets in log *  DICTATION: .Dragon Dictation  PLAN OF CARE: Admit for overnight observation  PATIENT DISPOSITION:  PACU - hemodynamically stable.   Delay start of Pharmacological VTE agent (>24hrs) due to surgical blood loss or risk of bleeding: yes

## 2022-05-06 NOTE — Progress Notes (Signed)
Orthopedic Tech Progress Note Patient Details:  [name] [name] 09/10/1946 [id number]  Patient has SOFT COLLAR   Patient ID: [name] [name], female   DOB: 06/27/1946, 76 y.o.   MRN: [id number]  [name] [name] 05/06/2022, 1:29 PM

## 2022-05-06 NOTE — H&P (Signed)
[name] [name] is an 76 y.o. female.   Chief Complaint: Neck pain HPI: 76 year old female with chronic and progressively worsening neck pain with associated headaches and some radiation into her proximal shoulders.  No evidence of overt myelopathy or radiculopathy.  Work-up demonstrates evidence of significant degenerative instability at C3-4 and C4-5 with degenerative anterolisthesis with flexion.  The patient has failed conservative management presents now for C3-4 and C4-5 anterior cervical discectomy and fusion in hopes of improving her symptoms.  Past Medical History:  Diagnosis Date   Arthritis    Atypical chest pain    Diverticulosis    Gastritis 11/08/2007, 04/12/2012   H pylori bx neg 03/2012   GERD (gastroesophageal reflux disease) 11/08/2007, 04/12/2012   Hepatic cyst    Hiatal hernia 11/08/2007, 04/12/2012   History of kidney stones    Hyperlipidemia    Hypertension    IBS (irritable bowel syndrome)    diarrhea predom   Migraine    Mitral regurgitation    Osteoporosis    DEXA 01/21/2012: -2.1 spine and R fem, -1.8 L fem s/p fosamax  2004-2011 DEXA 04/18/14 @ [facility]: -2.5 - rec to start Prolia     PVC's (premature ventricular contractions)    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   Complex valvuloplasty including PTFE neochord placement x6 with 30 mm Medtronic Simuform ring annuloplasty with clipping of LA appendage   Schatzki's ring 11/11/2010   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   Shingles outbreak 04/2013    Past Surgical History:  Procedure Laterality Date   [place] STUDY  03/14/2021   Procedure: BUBBLE STUDY;  Surgeon: Sueanne Margarita, MD;  Location: [facility];  Service: Cardiovascular;;   CARDIAC CATHETERIZATION     CLIPPING OF ATRIAL APPENDAGE N/A 05/21/2021   Procedure: CLIPPING OF ATRIAL APPENDAGE USING ATRICURE 40MM PRO2 CLIP;  Surgeon: Rexene Alberts, MD;  Location: [facility];  Service: Open Heart Surgery;  Laterality:  N/A;   COLONOSCOPY     CYSTOCELE REPAIR  2008   ESOPHAGOGASTRODUODENOSCOPY     Maxilofacial  1992   MITRAL VALVE REPAIR  05/21/2021   Procedure: MINIMALLY INVASIVE MITRAL VALVE REPAIR (MVR) USING MEDTRONIC SIMUFORM 30MM RING;  Surgeon: Rexene Alberts, MD;  Location: [facility];  Service: Open Heart Surgery;;   RIGHT/LEFT HEART CATH AND CORONARY ANGIOGRAPHY N/A 03/14/2021   Procedure: RIGHT/LEFT HEART CATH AND CORONARY ANGIOGRAPHY;  Surgeon: Lorretta Harp, MD;  Location: [facility];  Service: Cardiovascular;  Laterality: N/A;   TEE WITHOUT CARDIOVERSION N/A 03/14/2021   Procedure: TRANSESOPHAGEAL ECHOCARDIOGRAM (TEE);  Surgeon: Sueanne Margarita, MD;  Location[facility];  Service: Cardiovascular;  Laterality: N/A;   TEE WITHOUT CARDIOVERSION N/A 05/21/2021   Procedure: TRANSESOPHAGEAL ECHOCARDIOGRAM (TEE);  Surgeon: Rexene Alberts, MD;  Location: [facility];  Service: Open Heart Surgery;  Laterality: N/A;   TONSILLECTOMY  1960    Family History  Problem Relation Age of Onset   Hypertension Mother    Alzheimer's disease Mother 92   AAA (abdominal aortic aneurysm) Mother    Stroke Father 61   Kidney cancer Brother        mets   Bone cancer Brother 73   Colon cancer Neg Hx    Social History:  reports that she has never smoked. She has never used smokeless tobacco. She reports that she does not drink alcohol and does not use drugs.  Allergies: No Known Allergies  Medications Prior to Admission  Medication  Sig Dispense Refill   acetaminophen (TYLENOL) 650 MG CR tablet Take 650 mg by mouth every 8 (eight) hours as needed for pain (Headache).     amLODipine (NORVASC) 2.5 MG tablet Take 1 tablet (2.5 mg total) by mouth daily. Take 1 tablet in the evening (Patient taking differently: Take 2.5 mg by mouth every evening.) 90 tablet 3   amLODipine (NORVASC) 5 MG tablet Take 1 tablet (5 mg total) by mouth daily. (Patient taking differently: Take 5 mg by mouth every morning.) 180 tablet 3    aspirin EC 81 MG EC tablet Take 1 tablet (81 mg total) by mouth daily. Swallow whole. 30 tablet 11   atorvastatin (LIPITOR) 40 MG tablet Take 1 tablet (40 mg total) by mouth daily. (Patient taking differently: Take 40 mg by mouth at bedtime.) 90 tablet 3   Cyanocobalamin (VITAMIN B-12) 2500 MCG SUBL Place 2,500 mcg under the tongue daily.     enoxaparin (LOVENOX) 60 MG/0.6ML injection Inject 0.6 mLs (60 mg total) into the skin every 12 (twelve) hours. 12 mL 1   gabapentin (NEURONTIN) 100 MG capsule Take 2 capsules (200 mg total) by mouth at bedtime. 60 capsule 2   metoprolol succinate (TOPROL-XL) 25 MG 24 hr tablet TAKE ONE TABLET BY MOUTH EVERY MORNING and TAKE ONE TABLET BY MOUTH EVERYDAY AT BEDTIME 180 tablet 1   Peppermint Oil (IBGARD) 90 MG CPCR Use as needed (Patient taking differently: Take 90 mg by mouth daily as needed (Stomach control). Use as needed)     Polyethyl Glycol-Propyl Glycol (SYSTANE) 0.4-0.3 % SOLN Place 1 drop into both eyes 2 (two) times daily.     warfarin (COUMADIN) 2 MG tablet TAKE 1-2 TABLETS BY MOUTH ONCE DAILY OR AS PRESCRIBED BY COUMADIN CLINIC (Patient taking differently: Take 2-3 mg by mouth See admin instructions. Take 3 mg on Sun., Mon., Wed., and Fri All the other days take 2 mg   OR AS PRESCRIBED BY COUMADIN CLINIC) 60 tablet 1   loperamide (IMODIUM A-D) 2 MG tablet Take 1 tablet (2 mg total) by mouth as needed for diarrhea or loose stools. 30 tablet 0    No results found for this or any previous visit (from the past 48 hour(s)). No results found.  Pertinent items noted in HPI and remainder of comprehensive ROS otherwise negative.  Blood pressure 125/71, pulse 82, temperature 98.3 F (36.8 C), temperature source Oral, resp. rate 18, height '5\' 3"'$  (1.6 m), weight 59.4 kg, SpO2 98 %.  Patient is awake and alert.  She is oriented and appropriate.  Speech is fluent.  Judgment insight are intact.  Cranial nerve function normal bilateral.  Motor examination  reveals intact motor strength bilaterally sensory examination nonfocal.  Deep Temrex is normal active.  No evidence of long track signs.  Gait somewhat antalgic.  Posture normal peer examination head ears eyes nose and throat is unremarkable chest and abdomen are benign.  Extremities are free from injury or deformity. Assessment/Plan C3-4, C4-5 dynamic anterolisthesis with instability.  Plan C3-4, C4-5 anterior cervical discectomy with interbody fusion denies interbody cages, local harvested autograft, and anterior plate instrumentation.  Risks and benefits been explained.  Patient wishes to proceed.  Mallie Mussel A Jaclynn Laumann 05/06/2022, 10:26 AM

## 2022-05-06 NOTE — Transfer of Care (Signed)
Immediate Anesthesia Transfer of Care Note  Patient: [name] [name]  Procedure(s) Performed: CERVICAL THREE-FOUR, CERVICAL FOUR-FIVE ANTERIOR CERVICAL DECOMPRESSION/DISCECTOMY FUSION (Spine Cervical)  Patient Location: PACU  Anesthesia Type:General  Level of Consciousness: drowsy and patient cooperative  Airway & Oxygen Therapy: Patient Spontanous Breathing and Patient connected to nasal cannula oxygen  Post-op Assessment: Report given to RN and Post -op Vital signs reviewed and stable  Post vital signs: Reviewed and stable  Last Vitals:  Vitals Value Taken Time  BP 154/70 05/06/22 1245  Temp    Pulse 41 05/06/22 1246  Resp 11 05/06/22 1246  SpO2 100 % 05/06/22 1246  Vitals shown include unvalidated device data.  Last Pain:  Vitals:   05/06/22 0957  TempSrc:   PainSc: 0-No pain         Complications: No notable events documented.

## 2022-05-07 DIAGNOSIS — M50021 Cervical disc disorder at C4-C5 level with myelopathy: Secondary | ICD-10-CM | POA: Diagnosis not present

## 2022-05-07 MED ORDER — CYCLOBENZAPRINE HCL 10 MG PO TABS: 10.0000 mg | ORAL_TABLET | Freq: Three times a day (TID) | ORAL | 0 refills | Status: DC | PRN

## 2022-05-07 MED ORDER — WARFARIN SODIUM 2 MG PO TABS
2.0000 mg | ORAL_TABLET | ORAL | 0 refills | Status: DC
Start: 2022-05-07 — End: 2022-05-21

## 2022-05-07 MED ORDER — HYDROCODONE-ACETAMINOPHEN 5-325 MG PO TABS: 1.0000 | ORAL_TABLET | ORAL | 0 refills | Status: DC | PRN

## 2022-05-07 MED FILL — Thrombin For Soln 5000 Unit: CUTANEOUS | Qty: 2 | Status: AC

## 2022-05-07 NOTE — Evaluation (Signed)
Occupational Therapy Evaluation Patient Details Name: [name] [name] MRN: [id number] DOB: [birth year] Today's Date: 05/07/2022   History of Present Illness Pt is a 76 y/o F s/p ACDF C3-4, C4-5 on 6/13. PMH includes arthritis, diverticulitis, GERD, OA, HLD, HTN.   Clinical Impression   Pt independent at baseline with ADLs and functional mobility, lives with spouse in first floor apartment who can assist at d/c. Pt currently mod I -min A for ADLs, mod I for bed mobility and transfers without AD. Pt educated on cervical precautions, brace wear, compensatory strategies for ADLs, and log rolling technique for bed mobility.Pt states she plans to sleep in recliner initially. Discussed use of BSC as shower seat, and demo'd for pt,pt verbalized understanding. Pt presenting with impairments listed below, will follow acutely. Recommend d/c home with family assistance.      Recommendations for follow up therapy are one component of a multi-disciplinary discharge planning process, led by the attending physician.  Recommendations may be updated based on patient status, additional functional criteria and insurance authorization.   Follow Up Recommendations  No OT follow up    Assistance Recommended at Discharge Set up Supervision/Assistance  Patient can return home with the following A little help with bathing/dressing/bathroom;Assistance with cooking/housework;Assist for transportation;Help with stairs or ramp for entrance    Functional Status Assessment  Patient has had a recent decline in their functional status and demonstrates the ability to make significant improvements in function in a reasonable and predictable amount of time.  Equipment Recommendations  BSC/3in1    Recommendations for Other Services       Precautions / Restrictions Precautions Precautions: Cervical Precaution Booklet Issued: Yes (comment) Precaution Comments: educated pt on 3/3 precautions Required Braces or Orthoses:  Cervical Brace Cervical Brace: Soft collar;At all times Restrictions Weight Bearing Restrictions: No      Mobility Bed Mobility Overal bed mobility: Modified Independent             General bed mobility comments: use of log rolling technique with bed flat    Transfers Overall transfer level: Modified independent Equipment used: None                      Balance Overall balance assessment: Mild deficits observed, not formally tested                                         ADL either performed or assessed with clinical judgement   ADL Overall ADL's : Needs assistance/impaired Eating/Feeding: Modified independent;Sitting   Grooming: Modified independent;Standing   Upper Body Bathing: Modified independent;Sitting   Lower Body Bathing: Minimal assistance;Sitting/lateral leans   Upper Body Dressing : Supervision/safety;Sitting   Lower Body Dressing: Sitting/lateral leans;Supervision/safety   Toilet Transfer: Sales executive;Ambulation   Toileting- Clothing Manipulation and Hygiene: Supervision/safety;Sit to/from stand;Sitting/lateral lean       Functional mobility during ADLs: Supervision/safety       Vision   Vision Assessment?: No apparent visual deficits     Perception     Praxis      Pertinent Vitals/Pain Pain Assessment Pain Assessment: Faces Pain Score: 2  Faces Pain Scale: Hurts a little bit Pain Location: neck Pain Descriptors / Indicators: Discomfort Pain Intervention(s): Limited activity within patient's tolerance, Monitored during session, Repositioned     Hand Dominance Right   Extremity/Trunk Assessment Upper Extremity Assessment Upper Extremity Assessment: Overall WFL for tasks  assessed   Lower Extremity Assessment Lower Extremity Assessment: Overall WFL for tasks assessed   Cervical / Trunk Assessment Cervical / Trunk Assessment: Neck Surgery   Communication  Communication Communication: No difficulties   Cognition Arousal/Alertness: Awake/alert Behavior During Therapy: WFL for tasks assessed/performed Overall Cognitive Status: Within Functional Limits for tasks assessed                                       General Comments  VSS on RA    Exercises     Shoulder Instructions      Home Living Family/patient expects to be discharged to:: Private residence Living Arrangements: Spouse/significant other Available Help at Discharge: Family;Available 24 hours/day Type of Home: Apartment Home Access: Level entry     Home Layout: One level     Bathroom Shower/Tub: Tub/shower unit;Curtain   Biochemist, clinical: Standard Bathroom Accessibility: Yes How Accessible: Accessible via walker Home Equipment: Rolling Walker (2 wheels)          Prior Functioning/Environment Prior Level of Function : Independent/Modified Independent             Mobility Comments: no AD use ADLs Comments: does IADLs        OT Problem List: Decreased activity tolerance;Impaired balance (sitting and/or standing)      OT Treatment/Interventions: Self-care/ADL training;Therapeutic exercise;Therapeutic activities;Patient/family education;Balance training;DME and/or AE instruction;Energy conservation    OT Goals(Current goals can be found in the care plan section) Acute Rehab OT Goals Patient Stated Goal: none stated OT Goal Formulation: With patient Time For Goal Achievement: 05/21/22 Potential to Achieve Goals: Good  OT Frequency: Min 2X/week    Co-evaluation              AM-PAC OT "6 Clicks" Daily Activity     Outcome Measure Help from another person eating meals?: None Help from another person taking care of personal grooming?: None Help from another person toileting, which includes using toliet, bedpan, or urinal?: None Help from another person bathing (including washing, rinsing, drying)?: A Little Help from another person  to put on and taking off regular upper body clothing?: None Help from another person to put on and taking off regular lower body clothing?: A Little 6 Click Score: 22   End of Session Equipment Utilized During Treatment: Gait belt Nurse Communication: Mobility status  Activity Tolerance: Patient tolerated treatment well Patient left: in bed;with call bell/phone within reach  OT Visit Diagnosis: Unsteadiness on feet (R26.81);Other abnormalities of gait and mobility (R26.89);Muscle weakness (generalized) (M62.81)                Time: 2376-2831 OT Time Calculation (min): 21 min Charges:  OT General Charges $OT Visit: 1 Visit OT Evaluation $OT Eval Low Complexity: 1 Low  Lynnda Child, OTD, OTR/L Acute Rehab [phone] - [place] 05/07/2022, 9:09 AM

## 2022-05-07 NOTE — Discharge Summary (Signed)
Physician Discharge Summary  Patient ID: [name] [name] MRN: [id number] DOB/AGE: [birth year] 76 y.o.  Admit date: 05/06/2022 Discharge date: 05/07/2022  Admission Diagnoses:  Discharge Diagnoses:  Principal Problem:   Degenerative spondylolisthesis   Discharged Condition: good  Hospital Course: Patient admitted to the hospital where she underwent uncomplicated two-level anterior cervical decompression and fusion.  Postoperative doing well.  Preoperative neck and upper extremity pain much improved.  Standing ambulating and voiding without difficulty.  Swallowing well.  Voice strong.  Ready for discharge home.  Consults:   Significant Diagnostic Studies:   Treatments:   Discharge Exam: Blood pressure (!) 120/56, pulse (!) 44, temperature 98.3 F (36.8 C), temperature source Oral, resp. rate 18, height '5\' 3"'$  (1.6 m), weight 59.4 kg, SpO2 98 %. Awake and alert.  Oriented and appropriate.  Motor and sensory function intact.  Wound clean and dry.  Chest and abdomen benign.  Neck soft.  Swallowing well.  Disposition: Discharge disposition: 01-Home or Self Care        Allergies as of 05/07/2022   No Known Allergies      Medication List     TAKE these medications    acetaminophen 650 MG CR tablet Commonly known as: TYLENOL Take 650 mg by mouth every 8 (eight) hours as needed for pain (Headache).   amLODipine 5 MG tablet Commonly known as: NORVASC Take 1 tablet (5 mg total) by mouth daily. What changed: when to take this   amLODipine 2.5 MG tablet Commonly known as: NORVASC Take 1 tablet (2.5 mg total) by mouth daily. Take 1 tablet in the evening What changed:  when to take this additional instructions   aspirin EC 81 MG tablet Take 1 tablet (81 mg total) by mouth daily. Swallow whole.   atorvastatin 40 MG tablet Commonly known as: LIPITOR Take 1 tablet (40 mg total) by mouth daily. What changed: when to take this   cyclobenzaprine 10 MG tablet Commonly  known as: FLEXERIL Take 1 tablet (10 mg total) by mouth 3 (three) times daily as needed for muscle spasms.   enoxaparin 60 MG/0.6ML injection Commonly known as: LOVENOX Inject 0.6 mLs (60 mg total) into the skin every 12 (twelve) hours.   gabapentin 100 MG capsule Commonly known as: Neurontin Take 2 capsules (200 mg total) by mouth at bedtime.   HYDROcodone-acetaminophen 5-325 MG tablet Commonly known as: NORCO/VICODIN Take 1 tablet by mouth every 4 (four) hours as needed for moderate pain ((score 4 to 6)).   IBgard 90 MG Cpcr Generic drug: Peppermint Oil Use as needed What changed:  how much to take how to take this when to take this reasons to take this   loperamide 2 MG tablet Commonly known as: Imodium A-D Take 1 tablet (2 mg total) by mouth as needed for diarrhea or loose stools.   metoprolol succinate 25 MG 24 hr tablet Commonly known as: TOPROL-XL TAKE ONE TABLET BY MOUTH EVERY MORNING and TAKE ONE TABLET BY MOUTH EVERYDAY AT BEDTIME   Systane 0.4-0.3 % Soln Generic drug: Polyethyl Glycol-Propyl Glycol Place 1 drop into both eyes 2 (two) times daily.   Vitamin B-12 2500 MCG Subl Place 2,500 mcg under the tongue daily.   warfarin 2 MG tablet Commonly known as: COUMADIN Take as directed. If you are unsure how to take this medication, talk to your nurse or doctor. Original instructions: TAKE 1-2 TABLETS BY MOUTH ONCE DAILY OR AS PRESCRIBED BY COUMADIN CLINIC What changed:  how much to take how to  take this when to take this additional instructions               Durable Medical Equipment  (From admission, onward)           Start     Ordered   05/07/22 0913  For home use only DME 3 n 1  Once        05/07/22 0912             Signed: Cooper Render Autym Siess 05/07/2022, 10:01 AM

## 2022-05-07 NOTE — Discharge Instructions (Addendum)
Wound Care Keep incision covered and dry for three days.  Do not put any creams, lotions, or ointments on incision. Leave steri-strips on neck.  They will fall off by themselves. Activity Walk each and every day, increasing distance each day. No lifting greater than 5 lbs.  Avoid excessive neck motion. No driving for 2 weeks; may ride as a passenger locally. If provided with back brace, wear when out of bed.  It is not necessary to wear brace in bed. Diet Resume your normal diet.   Call Your Doctor If Any of These Occur Redness, drainage, or swelling at the wound.  Temperature greater than 101 degrees. Severe pain not relieved by pain medication. Incision starts to come apart. Follow Up Appt Call today for appointment in 1-2 weeks ([phone]) or for problems.  If you have any hardware placed in your spine, you will need an x-ray before your appointment.   Resume coumadin and lovenox tomorrow

## 2022-05-07 NOTE — Progress Notes (Signed)
Patient alert and oriented, mae's well, voiding adequate amount of urine, swallowing without difficulty, no c/o pain at time of discharge. Patient discharged home with family. Script and discharged instructions given to patient. Patient and family stated understanding of instructions given. Patient has an appointment with Dr. Pool in 2 weeks 

## 2022-05-07 NOTE — Plan of Care (Signed)

## 2022-05-09 ENCOUNTER — Encounter (HOSPITAL_COMMUNITY): Payer: Self-pay | Admitting: Neurosurgery

## 2022-05-12 ENCOUNTER — Ambulatory Visit (INDEPENDENT_AMBULATORY_CARE_PROVIDER_SITE_OTHER): Payer: PPO

## 2022-05-12 DIAGNOSIS — I340 Nonrheumatic mitral (valve) insufficiency: Secondary | ICD-10-CM | POA: Diagnosis not present

## 2022-05-12 DIAGNOSIS — Z9889 Other specified postprocedural states: Secondary | ICD-10-CM

## 2022-05-12 DIAGNOSIS — Z5181 Encounter for therapeutic drug level monitoring: Secondary | ICD-10-CM

## 2022-05-12 DIAGNOSIS — Z7901 Long term (current) use of anticoagulants: Secondary | ICD-10-CM

## 2022-05-12 LAB — POCT INR: INR: 1.2 — AB (ref 2.0–3.0)

## 2022-05-12 NOTE — Patient Instructions (Signed)
TAKE 2 TABLETS TODAY AND TUESDAY AND 2 INJECTIONS OF LOVENOX TODAY ONLY and then Continue taking 1.5 tablets daily except for 1 tablet on Tuesday, Thursday and Saturday. Recheck INR in 2 weeks.  Coumadin Clinic [phone];

## 2022-05-13 ENCOUNTER — Encounter: Payer: PPO | Admitting: Family Medicine

## 2022-05-14 ENCOUNTER — Other Ambulatory Visit: Payer: Self-pay | Admitting: Cardiovascular Disease

## 2022-05-19 ENCOUNTER — Other Ambulatory Visit: Payer: Self-pay | Admitting: Cardiovascular Disease

## 2022-05-21 ENCOUNTER — Other Ambulatory Visit: Payer: Self-pay | Admitting: Cardiovascular Disease

## 2022-05-21 DIAGNOSIS — I480 Paroxysmal atrial fibrillation: Secondary | ICD-10-CM

## 2022-05-21 NOTE — Progress Notes (Unsigned)
[place] Phone: [phone] Subjective:   [name] [name], am serving as a scribe for Dr. Hulan Saas.   I'm seeing this patient by the request  of:  [place], Betty G, MD  CC: Hand and back pain  JIR:[id number]  Last seen in 2021 for knee pain  [name] [name] is a 76 y.o. female coming in with complaint of left trigger finger for past 2 months. Pain in ring finger and pinky finger. Weraing a brace on L ring finger today. Also complains of R hand pain over 3rd metacarpals.   Patient also c/o R sided lower back pain for past month. Painful with rotation. Taking Tylenol and using activity modification.        Past Medical History:  Diagnosis Date   Arthritis    Atypical chest pain    Diverticulosis    Gastritis 11/08/2007, 04/12/2012   H pylori bx neg 03/2012   GERD (gastroesophageal reflux disease) 11/08/2007, 04/12/2012   Hepatic cyst    Hiatal hernia 11/08/2007, 04/12/2012   History of kidney stones    Hyperlipidemia    Hypertension    IBS (irritable bowel syndrome)    diarrhea predom   Migraine    Mitral regurgitation    Osteoporosis    DEXA 01/21/2012: -2.1 spine and R fem, -1.8 L fem s/p fosamax  2004-2011 DEXA 04/18/14 @ [facility]: -2.5 - rec to start Prolia     PVC's (premature ventricular contractions)    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   Complex valvuloplasty including PTFE neochord placement x6 with 30 mm Medtronic Simuform ring annuloplasty with clipping of LA appendage   Schatzki's ring 11/11/2010   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   Shingles outbreak 04/2013   Past Surgical History:  Procedure Laterality Date   ANTERIOR CERVICAL DECOMP/DISCECTOMY FUSION N/A 05/06/2022   Procedure: CERVICAL THREE-FOUR, CERVICAL FOUR-FIVE ANTERIOR CERVICAL DECOMPRESSION/DISCECTOMY FUSION;  Surgeon: Earnie Larsson, MD;  Location: [facility];  Service: Neurosurgery;  Laterality: N/A;    [place] STUDY  03/14/2021   Procedure: BUBBLE STUDY;  Surgeon: Sueanne Margarita, MD;  Location: [facility];  Service: Cardiovascular;;   CARDIAC CATHETERIZATION     CLIPPING OF ATRIAL APPENDAGE N/A 05/21/2021   Procedure: CLIPPING OF ATRIAL APPENDAGE USING ATRICURE 40MM PRO2 CLIP;  Surgeon: Rexene Alberts, MD;  Location: [facility];  Service: Open Heart Surgery;  Laterality: N/A;   COLONOSCOPY     CYSTOCELE REPAIR  2008   ESOPHAGOGASTRODUODENOSCOPY     Maxilofacial  1992   MITRAL VALVE REPAIR  05/21/2021   Procedure: MINIMALLY INVASIVE MITRAL VALVE REPAIR (MVR) USING MEDTRONIC SIMUFORM 30MM RING;  Surgeon: Rexene Alberts, MD;  Location: [facility];  Service: Open Heart Surgery;;   RIGHT/LEFT HEART CATH AND CORONARY ANGIOGRAPHY N/A 03/14/2021   Procedure: RIGHT/LEFT HEART CATH AND CORONARY ANGIOGRAPHY;  Surgeon: Lorretta Harp, MD;  Location: [facility];  Service: Cardiovascular;  Laterality: N/A;   TEE WITHOUT CARDIOVERSION N/A 03/14/2021   Procedure: TRANSESOPHAGEAL ECHOCARDIOGRAM (TEE);  Surgeon: Sueanne Margarita, MD;  Location: [facility];  Service: Cardiovascular;  Laterality: N/A;   TEE WITHOUT CARDIOVERSION N/A 05/21/2021   Procedure: TRANSESOPHAGEAL ECHOCARDIOGRAM (TEE);  Surgeon: Rexene Alberts, MD;  Location: [facility];  Service: Open Heart Surgery;  Laterality: N/A;   TONSILLECTOMY  1960   Social History   Socioeconomic History   Marital status: Married    Spouse name:  [name]   Number of children: 3   Years of education: Not on file   Highest education level: Master's degree (e.g., MA, MS, MEng, MEd, MSW, MBA)  Occupational History   Occupation: retired  Tobacco Use   Smoking status: Never   Smokeless tobacco: Never  Vaping Use   Vaping Use: Never used  Substance and Sexual Activity   Alcohol use: [name]   Drug use: [name]   Sexual activity: Not on file  Other Topics Concern   Not on file  Social History Narrative   Married, lives with spouse.  Retired Licensed conveyancer, Print production planner. Has 3 kids (moved to [place] to be closer)- youngest son MD. Dorie Rank to [place] from [place] 06/2010, lived in [place] x 10years   Social Determinants of Health   Financial Resource Strain: [place]  (02/06/2022)   Overall Financial Resource Strain (CARDIA)    Difficulty of Paying Living Expenses: Not hard at all  Food Insecurity: [name] Food Insecurity (02/06/2022)   Hunger Vital Sign    Worried About Running Out of Food in the Last Year: Never true    [place] in the Last Year: Never true  Transportation Needs: [name] Transportation Needs (02/06/2022)   PRAPARE - Hydrologist (Medical): [name]    Lack of Transportation (Non-Medical): [name]  Physical Activity: Inactive (02/06/2022)   Exercise Vital Sign    Days of Exercise per Week: 0 days    Minutes of Exercise per Session: 0 min  Stress: [name] Stress Concern Present (02/06/2022)   [place]    Feeling of Stress : Not at all  Social Connections: [place] (02/06/2022)   Social Connection and Isolation Panel [NHANES]    Frequency of Communication with Friends and Family: More than three times a week    Frequency of Social Gatherings with Friends and Family: More than three times a week    Attends Religious Services: More than 4 times per year    Active Member of [employer] or Organizations: Yes    Attends Music therapist: More than 4 times per year    Marital Status: Married   [name] Known Allergies Family History  Problem Relation Age of Onset   Hypertension Mother    Alzheimer's disease Mother 15   AAA (abdominal aortic aneurysm) Mother    Stroke Father 3   Kidney cancer Brother        mets   Bone cancer Brother 19   Colon cancer Neg Hx      Current Outpatient Medications (Cardiovascular):    atorvastatin (LIPITOR) 40 MG tablet, Take 1 tablet (40 mg total) by mouth daily. (Patient taking differently:  Take 40 mg by mouth at bedtime.)   metoprolol succinate (TOPROL-XL) 25 MG 24 hr tablet, TAKE ONE TABLET BY MOUTH EVERY MORNING and TAKE ONE TABLET BY MOUTH EVERYDAY AT BEDTIME   amLODipine (NORVASC) 2.5 MG tablet, Take 1 tablet (2.5 mg total) by mouth daily. Take 1 tablet in the evening (Patient taking differently: Take 2.5 mg by mouth every evening.)   amLODipine (NORVASC) 5 MG tablet, Take 1 tablet (5 mg total) by mouth daily. (Patient taking differently: Take 5 mg by mouth every morning.)   Current Outpatient Medications (Analgesics):    acetaminophen (TYLENOL) 650 MG CR tablet, Take 650 mg by mouth every 8 (eight) hours as needed for pain (Headache).   aspirin EC 81 MG EC tablet, Take 1 tablet (81  mg total) by mouth daily. Swallow whole.   HYDROcodone-acetaminophen (NORCO/VICODIN) 5-325 MG tablet, Take 1 tablet by mouth every 4 (four) hours as needed for moderate pain ((score 4 to 6)).  Current Outpatient Medications (Hematological):    Cyanocobalamin (VITAMIN B-12) 2500 MCG SUBL, Place 2,500 mcg under the tongue daily.   enoxaparin (LOVENOX) 60 MG/0.6ML injection, Inject 0.6 mLs (60 mg total) into the skin every 12 (twelve) hours.   warfarin (COUMADIN) 2 MG tablet, Take 1.5 tablets daily except 1 tablet on Tuesday, Thursday, Saturday or as directed by Anticoagulation Clinic.  Current Outpatient Medications (Other):    cyclobenzaprine (FLEXERIL) 10 MG tablet, Take 1 tablet (10 mg total) by mouth 3 (three) times daily as needed for muscle spasms.   gabapentin (NEURONTIN) 100 MG capsule, Take 2 capsules (200 mg total) by mouth at bedtime.   loperamide (IMODIUM A-D) 2 MG tablet, Take 1 tablet (2 mg total) by mouth as needed for diarrhea or loose stools.   Peppermint Oil (IBGARD) 90 MG CPCR, Use as needed (Patient taking differently: Take 90 mg by mouth daily as needed (Stomach control). Use as needed)   Polyethyl Glycol-Propyl Glycol (SYSTANE) 0.4-0.3 % SOLN, Place 1 drop into both eyes 2 (two)  times daily.   Reviewed prior external information including notes and imaging from  primary care provider As well as notes that were available from care everywhere and other healthcare systems.  Includes patient's recent surgical neck surgery.  Past medical history, social, surgical and family history all reviewed in electronic medical record.  [name] pertanent information unless stated regarding to the chief complaint.   Review of Systems:  [name] headache, visual changes, nausea, vomiting, diarrhea, constipation, dizziness, abdominal pain, skin rash, fevers, chills, night sweats, weight loss, swollen lymph nodes joint swelling, chest pain, shortness of breath, mood changes. POSITIVE muscle aches, body aches  Objective  Blood pressure 106/62, pulse 84, height '5\' 3"'$  (1.6 m), weight 134 lb (60.8 kg), SpO2 96 %.   General: [name] apparent distress alert and oriented x3 mood and affect normal, dressed appropriately.  HEENT: Pupils equal, extraocular movements intact patient does have an incision well-healed on the neck from her recent surgery Respiratory: Patient's speak in full sentences and does not appear short of breath  Cardiovascular: [name] lower extremity edema, non tender, [name] erythema  Left hand exam shows the patient does have tenderness to palpation at the A2 pulley of the ring finger on the left hand.  [name] triggering noted.  Tender to palpation, neurovascular intact distally. Contralateral side does have more of an inclusion cyst in the soft tissue that is freely movable from the tendon.  Nontender on exam.  In the area of the Dupuytren contracture.   Procedure: Real-time Ultrasound Guided Injection of left ring flexor strength Device: GE Logiq Q7 Ultrasound guided injection is preferred based studies that show increased duration, increased effect, greater accuracy, decreased procedural pain, increased response rate, and decreased cost with ultrasound guided versus blind injection.  Verbal  informed consent obtained.  Time-out conducted.  Noted [name] overlying erythema, induration, or other signs of local infection.  Skin prepped in a sterile fashion.  Local anesthesia: Topical Ethyl chloride.  With sterile technique and under real time ultrasound guidance: 0.5 cc of 0.5% Marcaine and 0.5 cc of Kenalog 40 mg/mL into the tendon sheath Completed without difficulty  Pain immediately resolved suggesting accurate placement of the medication.  Advised to call if fevers/chills, erythema, induration, drainage, or persistent bleeding.  Impression: Technically successful  ultrasound guided injection.   Impression and Recommendations:    The above documentation has been reviewed and is accurate and complete Lyndal Pulley, DO

## 2022-05-22 ENCOUNTER — Ambulatory Visit (INDEPENDENT_AMBULATORY_CARE_PROVIDER_SITE_OTHER): Payer: PPO | Admitting: Family Medicine

## 2022-05-22 ENCOUNTER — Encounter: Payer: Self-pay | Admitting: Family Medicine

## 2022-05-22 ENCOUNTER — Ambulatory Visit: Payer: Self-pay

## 2022-05-22 ENCOUNTER — Ambulatory Visit (INDEPENDENT_AMBULATORY_CARE_PROVIDER_SITE_OTHER): Payer: PPO

## 2022-05-22 VITALS — BP 106/62 | HR 84 | Ht 63.0 in | Wt 134.0 lb

## 2022-05-22 DIAGNOSIS — M65351 Trigger finger, right little finger: Secondary | ICD-10-CM

## 2022-05-22 DIAGNOSIS — M65342 Trigger finger, left ring finger: Secondary | ICD-10-CM

## 2022-05-22 DIAGNOSIS — G8929 Other chronic pain: Secondary | ICD-10-CM | POA: Diagnosis not present

## 2022-05-22 DIAGNOSIS — M545 Low back pain, unspecified: Secondary | ICD-10-CM

## 2022-05-22 DIAGNOSIS — M79642 Pain in left hand: Secondary | ICD-10-CM

## 2022-05-22 HISTORY — DX: Trigger finger, left ring finger: M65.342

## 2022-05-22 NOTE — Patient Instructions (Addendum)
Xray today Injected trigger finger today See me again in 2 months

## 2022-05-22 NOTE — Assessment & Plan Note (Signed)
Patient an injection previously and does now have a Dupuytren contracture starting minorly.  Patient does have full range of motion of the moment and good grip strength.  Discussed that on ultrasound appears to be more than superficial but likely would not have any difficulty.

## 2022-05-22 NOTE — Assessment & Plan Note (Signed)
Patient given injection and tolerated the procedure well.  Discussed icing regimen and home exercises.  Discussed bracing at night.  Patient likely will do well.  Has had an injection on the contralateral side.  Follow-up again in 6 to 8 weeks

## 2022-05-22 NOTE — Assessment & Plan Note (Signed)
Chronic pain with exacerbation.  Home exercises given.  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  Patient has had significant number of different medical problems over the course of time.  Patient wants to hold on anything such as of formal physical therapy at the moment.  We will try the home exercises, continue the gabapentin if it is helpful.  Follow-up again in 6 to 8 weeks.

## 2022-05-26 ENCOUNTER — Ambulatory Visit (INDEPENDENT_AMBULATORY_CARE_PROVIDER_SITE_OTHER): Payer: PPO | Admitting: *Deleted

## 2022-05-26 DIAGNOSIS — Z7901 Long term (current) use of anticoagulants: Secondary | ICD-10-CM

## 2022-05-26 DIAGNOSIS — I340 Nonrheumatic mitral (valve) insufficiency: Secondary | ICD-10-CM

## 2022-05-26 DIAGNOSIS — Z9889 Other specified postprocedural states: Secondary | ICD-10-CM

## 2022-05-26 LAB — POCT INR: INR: 1.9 — AB (ref 2.0–3.0)

## 2022-05-26 NOTE — Patient Instructions (Signed)
Description   TAKE 2 TABLETS TODAY ONLY and then start taking 1.5 tablets daily except for 1 tablet on Tuesdayand Saturday. Recheck INR in 2 weeks.  [facility] [phone];

## 2022-05-31 ENCOUNTER — Encounter: Payer: Self-pay | Admitting: Gastroenterology

## 2022-06-09 ENCOUNTER — Ambulatory Visit
Admission: RE | Admit: 2022-06-09 | Discharge: 2022-06-09 | Disposition: A | Discharge status: Home / Self Care | Payer: PPO | Source: Ambulatory Visit | Attending: Family Medicine | Admitting: Family Medicine

## 2022-06-09 DIAGNOSIS — M810 Age-related osteoporosis without current pathological fracture: Secondary | ICD-10-CM | POA: Diagnosis not present

## 2022-06-09 DIAGNOSIS — E2839 Other primary ovarian failure: Secondary | ICD-10-CM

## 2022-06-09 DIAGNOSIS — M85851 Other specified disorders of bone density and structure, right thigh: Secondary | ICD-10-CM | POA: Diagnosis not present

## 2022-06-09 DIAGNOSIS — Z780 Asymptomatic menopausal state: Secondary | ICD-10-CM | POA: Diagnosis not present

## 2022-06-13 DIAGNOSIS — M4310 Spondylolisthesis, site unspecified: Secondary | ICD-10-CM | POA: Diagnosis not present

## 2022-06-16 ENCOUNTER — Ambulatory Visit (INDEPENDENT_AMBULATORY_CARE_PROVIDER_SITE_OTHER): Payer: PPO

## 2022-06-16 DIAGNOSIS — Z9889 Other specified postprocedural states: Secondary | ICD-10-CM | POA: Diagnosis not present

## 2022-06-16 DIAGNOSIS — I340 Nonrheumatic mitral (valve) insufficiency: Secondary | ICD-10-CM

## 2022-06-16 DIAGNOSIS — Z7901 Long term (current) use of anticoagulants: Secondary | ICD-10-CM

## 2022-06-16 DIAGNOSIS — Z5181 Encounter for therapeutic drug level monitoring: Secondary | ICD-10-CM

## 2022-06-16 LAB — POCT INR: INR: 1.8 — AB (ref 2.0–3.0)

## 2022-06-16 NOTE — Patient Instructions (Signed)
TAKE another 0.5 tablet today only and then continue 1.5 tablets daily except for 1 tablet on Tuesday and Saturday. Recheck INR in 3 weeks.  [facility] [phone];

## 2022-06-23 ENCOUNTER — Other Ambulatory Visit: Payer: Self-pay | Admitting: Neurology

## 2022-06-23 DIAGNOSIS — G4486 Cervicogenic headache: Secondary | ICD-10-CM

## 2022-06-24 DIAGNOSIS — H52223 Regular astigmatism, bilateral: Secondary | ICD-10-CM | POA: Diagnosis not present

## 2022-06-24 DIAGNOSIS — H26493 Other secondary cataract, bilateral: Secondary | ICD-10-CM | POA: Diagnosis not present

## 2022-06-24 DIAGNOSIS — H4911 Fourth [trochlear] nerve palsy, right eye: Secondary | ICD-10-CM | POA: Diagnosis not present

## 2022-06-24 DIAGNOSIS — H353131 Nonexudative age-related macular degeneration, bilateral, early dry stage: Secondary | ICD-10-CM | POA: Diagnosis not present

## 2022-06-24 DIAGNOSIS — H40013 Open angle with borderline findings, low risk, bilateral: Secondary | ICD-10-CM | POA: Diagnosis not present

## 2022-06-24 DIAGNOSIS — H524 Presbyopia: Secondary | ICD-10-CM | POA: Diagnosis not present

## 2022-06-24 NOTE — Telephone Encounter (Signed)
Prolia VOB initiated via [email or web address]  Last Prolia inj 02/10/22 Next Prolia inj due 08/14/22

## 2022-06-27 ENCOUNTER — Ambulatory Visit (HOSPITAL_COMMUNITY): Payer: PPO | Attending: Cardiology

## 2022-06-27 DIAGNOSIS — I340 Nonrheumatic mitral (valve) insufficiency: Secondary | ICD-10-CM | POA: Insufficient documentation

## 2022-06-27 LAB — ECHOCARDIOGRAM COMPLETE
Area-P 1/2: 3.72 cm2
MV VTI: 1.69 cm2
S' Lateral: 3.2 cm

## 2022-06-30 NOTE — Progress Notes (Unsigned)
ACUTE VISIT Chief Complaint  Patient presents with   Ear Pain    Right ear   HPI: [name] [name] is a 76 y.o. female, who is here today with her husband complaining of right ear echo like sound, no earache. Muffle hearing. Hx of cerumen excess, she has tried to clean ear with a hair pins. No hx of trauma. Negative for changes in hearing,sore throat,or ear drainage. No recent URI or travel.  Ear Fullness  There is pain in the right ear. This is a recurrent problem. The current episode started in the past 7 days. The problem has been unchanged. There has been no fever. The pain is at a severity of 0/10. The patient is experiencing no pain. Associated symptoms include headaches and neck pain. Pertinent negatives include no abdominal pain, coughing, rash, rhinorrhea or vomiting. She has tried nothing for the symptoms.   Since her last visit she has undergone cervical decompression/discectomy fusion, 05/06/2022. She has not noted significant improvement of her chronic cervical pain.  DEXA 06/09/22: Osteoporosis. Spine L1-L2 is 0.809 g/cm2 with a T-score of -3.0. She has been on Prolia every 6 months for about 3 years. She takes calcium and vitamin D supplementation. She took Fosamax for a few years.  Memory difficulties: For about 6 months she has noted that she is forgetting things more frequent. Does not recall where she places objects sometimes, has trouble with word finding, and her husband has noted that she asks same question a few times through the day. Mother had some type of dementia, ? Alzheimer's disease. Negative for focal weakness or worsening headaches. Occipital headache, she follows with neurologist.  Lab Results  Component Value Date   TSH 0.92 02/19/2021   Lab Results  Component Value Date   OZHYQMVH84 6,962 (H) 06/30/2017   Lab Results  Component Value Date   WBC 7.2 04/28/2022   HGB 14.0 04/28/2022   HCT 41.2 04/28/2022   MCV 100.2 (H) 04/28/2022    PLT 209 04/28/2022   Lab Results  Component Value Date   ALT 14 07/29/2021   AST 20 07/29/2021   ALKPHOS 48 07/29/2021   BILITOT 0.4 07/29/2021   Lab Results  Component Value Date   CREATININE 0.80 04/28/2022   BUN 18 04/28/2022   NA 140 04/28/2022   K 4.0 04/28/2022   CL 108 04/28/2022   CO2 25 04/28/2022   Review of Systems  Constitutional:  Negative for activity change and appetite change.  HENT:  Negative for rhinorrhea and trouble swallowing.   Respiratory:  Negative for cough, shortness of breath and wheezing.   Gastrointestinal:  Negative for abdominal pain, nausea and vomiting.       Negative for changes in bowel habits.  Endocrine: Negative for cold intolerance and heat intolerance.  Genitourinary:  Negative for dysuria and hematuria.  Musculoskeletal:  Positive for back pain and neck pain. Negative for gait problem.  Skin:  Negative for rash.  Neurological:  Positive for headaches. Negative for seizures, syncope and weakness.  Psychiatric/Behavioral:  Negative for confusion and hallucinations.   Rest see pertinent positives and negatives per HPI.  Current Outpatient Medications on File Prior to Visit  Medication Sig Dispense Refill   acetaminophen (TYLENOL) 650 MG CR tablet Take 650 mg by mouth every 8 (eight) hours as needed for pain (Headache).     aspirin EC 81 MG EC tablet Take 1 tablet (81 mg total) by mouth daily. Swallow whole. 30 tablet 11   atorvastatin (  LIPITOR) 40 MG tablet Take 1 tablet (40 mg total) by mouth daily. (Patient taking differently: Take 40 mg by mouth at bedtime.) 90 tablet 3   Cyanocobalamin (VITAMIN B-12) 2500 MCG SUBL Place 2,500 mcg under the tongue daily.     cyclobenzaprine (FLEXERIL) 10 MG tablet Take 1 tablet (10 mg total) by mouth 3 (three) times daily as needed for muscle spasms. 30 tablet 0   gabapentin (NEURONTIN) 100 MG capsule TAKE ONE CAPSULE BY MOUTH EVERY MORNING and TAKE TWO CAPSULES BY MOUTH EVERYDAY AT BEDTIME 90 capsule 0    HYDROcodone-acetaminophen (NORCO/VICODIN) 5-325 MG tablet Take 1 tablet by mouth every 4 (four) hours as needed for moderate pain ((score 4 to 6)). 30 tablet 0   loperamide (IMODIUM A-D) 2 MG tablet Take 1 tablet (2 mg total) by mouth as needed for diarrhea or loose stools. 30 tablet 0   metoprolol succinate (TOPROL-XL) 25 MG 24 hr tablet TAKE ONE TABLET BY MOUTH EVERY MORNING and TAKE ONE TABLET BY MOUTH EVERYDAY AT BEDTIME 180 tablet 1   Peppermint Oil (IBGARD) 90 MG CPCR Use as needed (Patient taking differently: Take 90 mg by mouth daily as needed (Stomach control). Use as needed)     Polyethyl Glycol-Propyl Glycol (SYSTANE) 0.4-0.3 % SOLN Place 1 drop into both eyes 2 (two) times daily.     warfarin (COUMADIN) 2 MG tablet Take 1.5 tablets daily except 1 tablet on Tuesday, Thursday, Saturday or as directed by Anticoagulation Clinic. 45 tablet 1   amLODipine (NORVASC) 2.5 MG tablet Take 1 tablet (2.5 mg total) by mouth daily. Take 1 tablet in the evening (Patient taking differently: Take 2.5 mg by mouth every evening.) 90 tablet 3   amLODipine (NORVASC) 5 MG tablet Take 1 tablet (5 mg total) by mouth daily. (Patient taking differently: Take 5 mg by mouth every morning.) 180 tablet 3   No current facility-administered medications on file prior to visit.   Past Medical History:  Diagnosis Date   Arthritis    Atypical chest pain    Diverticulosis    Gastritis 11/08/2007, 04/12/2012   H pylori bx neg 03/2012   GERD (gastroesophageal reflux disease) 11/08/2007, 04/12/2012   Hepatic cyst    Hiatal hernia 11/08/2007, 04/12/2012   History of kidney stones    Hyperlipidemia    Hypertension    IBS (irritable bowel syndrome)    diarrhea predom   Migraine    Mitral regurgitation    Osteoporosis    DEXA 01/21/2012: -2.1 spine and R fem, -1.8 L fem s/p fosamax  2004-2011 DEXA 04/18/14 @ [facility]: -2.5 - rec to start Prolia     PVC's (premature ventricular contractions)    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   Complex valvuloplasty including PTFE neochord placement x6 with 30 mm Medtronic Simuform ring annuloplasty with clipping of LA appendage   Schatzki's ring 11/11/2010   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   Shingles outbreak 04/2013   No Known Allergies  Social History   Socioeconomic History   Marital status: Married    Spouse name: [name]   Number of children: 3   Years of education: Not on file   Highest education level: Master's degree (e.g., MA, MS, MEng, MEd, MSW, MBA)  Occupational History   Occupation: retired  Tobacco Use   Smoking status: Never   Smokeless tobacco: Never  Vaping Use   Vaping Use: Never used  Substance and Sexual Activity   Alcohol use: No   Drug use: No  Sexual activity: Not on file  Other Topics Concern   Not on file  Social History Narrative   Married, lives with spouse. Retired Licensed conveyancer, Print production planner. Has 3 kids (moved to [place] to be closer)- youngest son MD. Dorie Rank to [place] from [place] 06/2010, lived in [place] x 10years   Social Determinants of Health   Financial Resource Strain: [place]  (02/06/2022)   Overall Financial Resource Strain (CARDIA)    Difficulty of Paying Living Expenses: Not hard at all  Food Insecurity: No Food Insecurity (02/06/2022)   Hunger Vital Sign    Worried About Running Out of Food in the Last Year: Never true    [place] in the Last Year: Never true  Transportation Needs: No Transportation Needs (02/06/2022)   PRAPARE - Hydrologist (Medical): No    Lack of Transportation (Non-Medical): No  Physical Activity: Inactive (02/06/2022)   Exercise Vital Sign    Days of Exercise per Week: 0 days    Minutes of Exercise per Session: 0 min  Stress: No Stress Concern Present (02/06/2022)   Rison of Stress : Not at all  Social Connections: [place] (02/06/2022)   Social  Connection and Isolation Panel [NHANES]    Frequency of Communication with Friends and Family: More than three times a week    Frequency of Social Gatherings with Friends and Family: More than three times a week    Attends Religious Services: More than 4 times per year    Active Member of [employer] or Organizations: Yes    Attends Archivist Meetings: More than 4 times per year    Marital Status: Married   Vitals:   07/01/22 0959  BP: 120/70  Pulse: 75  Resp: 12  SpO2: 97%   Body mass index is 23.56 kg/m.  Physical Exam Vitals and nursing note reviewed.  Constitutional:      General: She is not in acute distress.    Appearance: She is well-developed. She is not ill-appearing.  HENT:     Head: Normocephalic and atraumatic.     Right Ear: External ear normal.     Left Ear: Tympanic membrane, ear canal and external ear normal.     Ears:     Comments: Cerumen impaction right, can not see TM.    Mouth/Throat:     Mouth: Mucous membranes are moist.  Eyes:     Conjunctiva/sclera: Conjunctivae normal.  Cardiovascular:     Rate and Rhythm: Normal rate and regular rhythm.     Heart sounds: No murmur heard. Pulmonary:     Effort: Pulmonary effort is normal. No respiratory distress.     Breath sounds: Normal breath sounds. No stridor.  Lymphadenopathy:     Cervical: No cervical adenopathy.  Skin:    General: Skin is warm.     Findings: No erythema or rash.  Neurological:     General: No focal deficit present.     Mental Status: She is alert and oriented to person, place, and time.     Gait: Gait normal.  Psychiatric:     Comments: Well groomed, good eye contact.   ASSESSMENT AND PLAN:  [name] [name] was seen today for ear pain.  Diagnoses and all orders for this visit: Orders Placed This Encounter  Procedures   Comprehensive metabolic panel   Vitamin F64   TSH   Neuropsychological assessment  Lab Results  Component Value Date   TSH 0.67 07/01/2022   Lab  Results  Component Value Date   CREATININE 0.95 07/01/2022   BUN 28 (H) 07/01/2022   NA 139 07/01/2022   K 4.0 07/01/2022   CL 102 07/01/2022   CO2 26 07/01/2022   Lab Results  Component Value Date   ALT 20 07/01/2022   AST 26 07/01/2022   ALKPHOS 39 07/01/2022   BILITOT 0.6 07/01/2022   Lab Results  Component Value Date   VITAMINB12 >1500 (H) 07/01/2022   Hearing loss of right ear due to cerumen impaction She agrees with holding on ear lavage until she uses ear drops to soften cerumen Continue avoiding Q tip. Avoid cleaning ears with hair pins or other object.  -     carbamide peroxide (DEBROX) 6.5 % OTIC solution; Place 5 drops into the left ear 2 (two) times daily.  Memory difficulties We discussed possible etiologies. Problem is getting worse. I do not think head imaging is needed, she had brain MRI in 07/2021, which showed age-related cerebral atrophy with moderate chronic microvascular ischemic disease. Neuropsychologic evaluation will be arranged. Cognitive challenging games. Further recommendations according to lab results.  Localized osteoporosis without current pathological fracture Lumbar spine has worsened and hip has improved some. Currently on Prolia. She has taken Fosamax in the past. We discussed other treatment options. We decided to continue Prolia for now and to hold on endocrinology referral. Fall prevention and continue Ca++ and Vit D supplementation.  Return in about 10 days (around 07/11/2022).  Derrian Poli G. [place], MD  [facility]. [place] office.

## 2022-07-01 ENCOUNTER — Encounter: Payer: Self-pay | Admitting: Family Medicine

## 2022-07-01 ENCOUNTER — Ambulatory Visit (INDEPENDENT_AMBULATORY_CARE_PROVIDER_SITE_OTHER): Payer: PPO | Admitting: Family Medicine

## 2022-07-01 VITALS — BP 120/70 | HR 75 | Resp 12 | Ht 63.0 in | Wt 133.0 lb

## 2022-07-01 DIAGNOSIS — H6121 Impacted cerumen, right ear: Secondary | ICD-10-CM

## 2022-07-01 DIAGNOSIS — R413 Other amnesia: Secondary | ICD-10-CM

## 2022-07-01 DIAGNOSIS — M816 Localized osteoporosis [Lequesne]: Secondary | ICD-10-CM | POA: Diagnosis not present

## 2022-07-01 LAB — COMPREHENSIVE METABOLIC PANEL
ALT: 20 U/L (ref 0–35)
AST: 26 U/L (ref 0–37)
Albumin: 4.4 g/dL (ref 3.5–5.2)
Alkaline Phosphatase: 39 U/L (ref 39–117)
BUN: 28 mg/dL — ABNORMAL HIGH (ref 6–23)
CO2: 26 mEq/L (ref 19–32)
Calcium: 10 mg/dL (ref 8.4–10.5)
Chloride: 102 mEq/L (ref 96–112)
Creatinine, Ser: 0.95 mg/dL (ref 0.40–1.20)
GFR: 57.97 mL/min — ABNORMAL LOW (ref >60.00)
Glucose, Bld: 90 mg/dL (ref 70–99)
Potassium: 4 mEq/L (ref 3.5–5.1)
Sodium: 139 mEq/L (ref 135–145)
Total Bilirubin: 0.6 mg/dL (ref 0.2–1.2)
Total Protein: 7.3 g/dL (ref 6.0–8.3)

## 2022-07-01 LAB — VITAMIN B12: Vitamin B-12: >1500 pg/mL — ABNORMAL HIGH (ref 211–911)

## 2022-07-01 LAB — TSH: TSH: 0.67 u[IU]/mL (ref 0.35–5.50)

## 2022-07-01 MED ORDER — DEBROX 6.5 % OT SOLN: 5.0000 [drp] | Freq: Two times a day (BID) | OTIC | 0 refills | Status: DC

## 2022-07-01 NOTE — Patient Instructions (Addendum)
A few things to remember from today's visit:  Memory difficulties - Plan: Neuropsychological assessment, Comprehensive metabolic panel, Vitamin E83, TSH  Hearing loss of right ear due to cerumen impaction - Plan: carbamide peroxide (DEBROX) 6.5 % OTIC solution  Localized osteoporosis without current pathological fracture  If you need refills please call your pharmacy. Do not use My Chart to request refills or for acute issues that need immediate attention.  Pongase las gotas en el oido por una semana y despues podemos tratar de lavar el oido.  Acumulacin de cera en el odo en adultos Earwax Buildup, Adult Los odos producen una sustancia llamada cera que ayuda a evitar que ingresen bacterias en el odo y protege la piel del canal [place]. En ocasiones, la cera se puede acumular en el odo y causar molestias o prdida de la audicin. Cules son las causas? Esta afeccin es causada por una acumulacin de cera. Los canales auditivos se limpian por s solos. La cera de los odos se produce en la parte externa del canal auditivo y generalmente cae hacia afuera en pequeas cantidades con el tiempo. Cuando el mecanismo de autolimpieza no funciona, la cera se acumula y eso puede reducir la audicin y Engineer, drilling. Tratar de [employer] odos con hisopos puede empujar la cera hacia el interior del canal auditivo y Engineer, drilling y disminucin de la audicin. Qu incrementa el riesgo? Es ms probable que Orthoptist en las personas que presentan alguna de estas caractersticas: Se limpian frecuentemente los odos con hisopos. Se escarban los odos. Utilizan tapones para los odos o auriculares internos con frecuencia, o usan audfonos. Los siguientes factores tambin pueden hacer que usted sea ms propenso a Armed forces training and education officer afeccin: Ser hombre. Ser Ardelia Mems persona de edad avanzada. Producir ms cera de forma natural. Tener los canales [employer]. Tener cera que es  demasiado densa o pegajosa. Tener vello excesivo en el Control and instrumentation engineer. Tener eccema. Estar deshidratado. Cules son los signos o sntomas? Los sntomas de esta afeccin incluyen: Disminucin de la audicin. Sensacin de que el odo est lleno u obstruido. Secrecin de lquido. Dolor o Biomedical engineer odo. Zumbidos en el odo. Tos. Trastornos del equilibrio. Porcin de cera que se puede observar en el interior del canal auditivo. Cmo se diagnostica? Esta afeccin se puede diagnosticar en funcin de lo siguiente: Sus sntomas. Sus antecedentes mdicos. Un examen de odo. Durante el examen, el mdico mirar dentro de su odo con un instrumento llamado otoscopio. Pueden hacerle otros estudios, como una prueba de audicin. Cmo se trata? El tratamiento para esta afeccin puede incluir lo siguiente: Gotas ticas para ablandar la cera. Extraccin de cera realizada por un mdico. El mdico tambin podr hacer lo siguiente: Enjuagar el odo con agua. Usar un instrumento con un anillo en el extremo (cureta). Usar un dispositivo de succin. Realizar una ciruga para extraer la acumulacin de cera. Esto puede Editor, commissioning graves. Siga estas instrucciones en su casa:  Use los medicamentos de venta libre y los recetados solamente como se lo haya indicado el mdico. No se introduzca ningn objeto en el odo, ni siquiera hisopos de algodn. La abertura del canal auditivo se puede limpiar con un pao o pauelo facial. Siga las instrucciones del mdico acerca de cmo limpiarse los odos. No se limpie los odos en exceso. Beber suficiente lquido como para Theatre manager la orina de color amarillo plido. Esto ayudar a Cabin crew. Concurra a todas las visitas de [employer] se lo hayan  indicado. Si tiene acumulacin de cera en el odo con frecuencia o [place] audfonos, visite a su mdico para que le realice una limpieza de odo de rutina y preventiva. Pregntele al mdico con qu frecuencia  debe programar estas limpiezas. Si tiene audfonos, lmpielos segn las instrucciones del fabricante y de su mdico. Comunquese con un mdico si: Tiene dolor de odo. Presenta fiebre. Le sale pus u otro lquido del odo. Tiene prdida de la audicin. Tiene zumbidos en el odo que no desaparecen. Tiene la sensacin de que la habitacin da vueltas (vrtigo). Los sntomas no mejoran con Dispensing optician. Solicite ayuda de inmediato si: Le sangra el odo afectado. Tiene dolor intenso de odo. Resumen La cera se puede acumular en el odo y causar molestias o prdida de la audicin. Los sntomas ms comunes de esta afeccin incluyen una audicin reducida o [place] y la sensacin de que el odo est lleno o est obstruido. Esta afeccin se puede diagnosticar en funcin de los sntomas, sus antecedentes mdicos y un examen de odo. Esta afeccin se puede tratar mediante gotas ticas que ablandan la cera o mediante la extraccin de la cera que realiza el mdico. No se introduzca ningn objeto en el odo, ni siquiera hisopos de algodn. La abertura del canal auditivo se puede limpiar con un pao o pauelo facial. Esta informacin no tiene como fin reemplazar el consejo del mdico. Asegrese de hacerle al mdico cualquier pregunta que tenga. Document Revised: 05/10/2020 Document Reviewed: 05/10/2020 Elsevier Patient Education  [place].

## 2022-07-03 NOTE — Telephone Encounter (Signed)
Undisclosed

## 2022-07-07 ENCOUNTER — Encounter: Payer: Self-pay | Admitting: Cardiovascular Disease

## 2022-07-07 ENCOUNTER — Ambulatory Visit: Payer: PPO | Admitting: Cardiovascular Disease

## 2022-07-07 ENCOUNTER — Ambulatory Visit (INDEPENDENT_AMBULATORY_CARE_PROVIDER_SITE_OTHER): Payer: PPO

## 2022-07-07 VITALS — BP 134/78 | HR 72 | Ht 63.0 in | Wt 133.8 lb

## 2022-07-07 DIAGNOSIS — Z9889 Other specified postprocedural states: Secondary | ICD-10-CM | POA: Diagnosis not present

## 2022-07-07 DIAGNOSIS — Z7901 Long term (current) use of anticoagulants: Secondary | ICD-10-CM

## 2022-07-07 DIAGNOSIS — E782 Mixed hyperlipidemia: Secondary | ICD-10-CM

## 2022-07-07 DIAGNOSIS — I480 Paroxysmal atrial fibrillation: Secondary | ICD-10-CM | POA: Diagnosis not present

## 2022-07-07 DIAGNOSIS — I340 Nonrheumatic mitral (valve) insufficiency: Secondary | ICD-10-CM

## 2022-07-07 DIAGNOSIS — I10 Essential (primary) hypertension: Secondary | ICD-10-CM

## 2022-07-07 DIAGNOSIS — Z5181 Encounter for therapeutic drug level monitoring: Secondary | ICD-10-CM

## 2022-07-07 LAB — POCT INR: INR: 1.5 — AB (ref 2.0–3.0)

## 2022-07-07 NOTE — Assessment & Plan Note (Signed)
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 Roxy Manns 05/21/2021 with a 38 mm Medtronic SIMUFORM ring.  She also had left atrial appendage clipping at the time.  Her most recent 2D echocardiogram performed 06/27/2022 revealed an EF of 45 to 50% representing a slight improvement compared to the most recent echo 07/03/2021 with trivial MR.  This will be repeated on an annual basis.  She does state that her dyspnea has resolved and her fatigue is markedly improved as well.

## 2022-07-07 NOTE — Patient Instructions (Addendum)
Medication Instructions:  No changes *If you need a refill on your cardiac medications before your next appointment, please call your pharmacy*   Lab Work: None ordered If you have labs (blood work) drawn today and your tests are completely normal, you will receive your results only by: [place] (if you have MyChart) OR A paper copy in the mail If you have any lab test that is abnormal or we need to change your treatment, we will call you to review the results.   Testing/Procedures: Your physician has requested that you have an echocardiogram in 12 months. Echocardiography is a painless test that uses sound waves to create images of your heart. It provides your doctor with information about the size and shape of your heart and how well your heart's chambers and valves are working. You may receive an ultrasound enhancing agent through an IV if needed to better visualize your heart during the echo.This procedure takes approximately one hour. There are no restrictions for this procedure. This will take place at the [address], [address].     Follow-Up: At [facility], you and your health needs are our priority.  As part of our continuing mission to provide you with exceptional heart care, we have created designated Provider Care Teams.  These Care Teams include your primary Cardiologist (physician) and Advanced Practice Providers (APPs -  Physician Assistants and Nurse Practitioners) who all work together to provide you with the care you need, when you need it.  We recommend signing up for the patient portal called "MyChart".  Sign up information is provided on this After Visit Summary.  MyChart is used to connect with patients for Virtual Visits (Telemedicine).  Patients are able to view lab/test results, encounter notes, upcoming appointments, etc.  Non-urgent messages can be sent to your provider as well.   To learn more about what you can do with MyChart, go to  [email or web address].    Your next appointment:   12 month(s)  The format for your next appointment:   In Person  Provider:   Quay Burow, MD {   Important Information About Sugar

## 2022-07-07 NOTE — Assessment & Plan Note (Signed)
History of essential hypertension a blood pressure measured today at 134/78.  She is on amlodipine and metoprolol.

## 2022-07-07 NOTE — Progress Notes (Signed)
07/07/2022 [name] [name]   1946-09-10  [id number]  Primary Physician Martinique, Betty G, MD Primary Cardiologist: Lorretta Harp MD Garret Reddish, [place], [place]  HPI:  [name] [name] is a 76 y.o.    [name] [name] is a  41 -year-old married Caucasian female mother of 3 children, grandmother to 2 grandchildren, patient Dr. Billey Gosling who saw Dr. Sallyanne Kuster remotely.  She is accompanied by her husband [name] [name] today.  She was referred for atypical chest pain.  I last saw her in the office 12/31/2021.Marland Kitchen  Her cardiac risk factor profile is notable for treated hypertension and mild hyperlipidemia. She has never had a heart attack or stroke. She is otherwise healthy except for GERD. She doesn't saw Dr. Sallyanne Kuster back in 2011 for atypical chest pain and workup was negative including a Myoview stress test. She was recently out of the country for several months and returned one month ago. Since that time she's had daily chest pain. She was under a lot of stress and she was away the pain itself sounds like GERD, begin subxiphoid and has no other characteristic symptoms of angina.   Since I saw her 2 years ago she is complained of increasing dyspnea on exertion.  She did have a negative Myoview stress test.  Recent 2D echo revealed severe MR.     She underwent right and left heart cath by myself 03/14/2021 revealing normal coronary arteries with a high V wave.  She also underwent TEE at the same time revealing normal LV function with a flail posterior leaflet and severe MR.  She saw Dr. Roxy Manns at my request and underwent minimally invasive mitral valve repair on 05/21/2021 with a 38 mm Medtronic SIMUFORM  ring as well as left atrial appendage clipping.  She was discharged home on 05/26/2021.  She is recuperated nicely.  She was seen for postop visit at Dr. Guy Sandifer  office and was complaining of some "fatigue".  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  Her breathing  has significantly improved compared to her symptoms preoperatively.  Her 2D echocardiogram performed 07/03/2021 showed a decline in her EF from 50 to 55% down to 40 to 45% although her MR has resolved.  Since I saw her 6 months ago she continues to do well.  Her fatigue has somewhat improved.  She recently had cervical disc surgery by Dr. Deri Fuelling still has neck pain.  She denies chest pain, shortness of breath or fatigue.  She remains on Coumadin.  Her most recent 2D echocardiogram performed 8//23 revealed a slight improvement in her ejection fraction up to 45 to 50%.  There was no MR noted.     Current Meds  Medication Sig   acetaminophen (TYLENOL) 650 MG CR tablet Take 650 mg by mouth every 8 (eight) hours as needed for pain (Headache).   amLODipine (NORVASC) 2.5 MG tablet Take 1 tablet (2.5 mg total) by mouth daily. Take 1 tablet in the evening (Patient taking differently: Take 2.5 mg by mouth every evening.)   amLODipine (NORVASC) 5 MG tablet Take 1 tablet (5 mg total) by mouth daily. (Patient taking differently: Take 5 mg by mouth every morning.)   aspirin EC 81 MG EC tablet Take 1 tablet (81 mg total) by mouth daily. Swallow whole.   atorvastatin (LIPITOR) 40 MG tablet Take 1 tablet (40 mg total) by mouth daily. (Patient taking differently: Take 40 mg by mouth at  bedtime.)   carbamide peroxide (DEBROX) 6.5 % OTIC solution Place 5 drops into the left ear 2 (two) times daily.   Cyanocobalamin (VITAMIN B-12) 2500 MCG SUBL Place 2,500 mcg under the tongue daily.   cyclobenzaprine (FLEXERIL) 10 MG tablet Take 1 tablet (10 mg total) by mouth 3 (three) times daily as needed for muscle spasms.   gabapentin (NEURONTIN) 100 MG capsule TAKE ONE CAPSULE BY MOUTH EVERY MORNING and TAKE TWO CAPSULES BY MOUTH EVERYDAY AT BEDTIME   metoprolol succinate (TOPROL-XL) 25 MG 24 hr tablet TAKE ONE TABLET BY MOUTH EVERY MORNING and TAKE ONE TABLET BY MOUTH EVERYDAY AT BEDTIME   Peppermint Oil (IBGARD) 90 MG CPCR  Use as needed (Patient taking differently: Take 90 mg by mouth daily as needed (Stomach control). Use as needed)   Polyethyl Glycol-Propyl Glycol (SYSTANE) 0.4-0.3 % SOLN Place 1 drop into both eyes 2 (two) times daily.   warfarin (COUMADIN) 2 MG tablet Take 1.5 tablets daily except 1 tablet on Tuesday, Thursday, Saturday or as directed by Anticoagulation Clinic.     No Known Allergies  Social History   Socioeconomic History   Marital status: Married    Spouse name: [name] [name]   Number of children: 3   Years of education: Not on file   Highest education level: Master's degree (e.g., MA, [name], MEng, MEd, MSW, MBA)  Occupational History   Occupation: retired  Tobacco Use   Smoking status: Never   Smokeless tobacco: Never  Vaping Use   Vaping Use: Never used  Substance and Sexual Activity   Alcohol use: No   Drug use: No   Sexual activity: Not on file  Other Topics Concern   Not on file  Social History Narrative   Married, lives with spouse. Retired Licensed conveyancer, Print production planner. Has 3 kids (moved to [place] to be closer)- youngest son MD. Dorie Rank to  from [place] 06/2010, lived in [place] x 10years   Social Determinants of Health   Financial Resource Strain: [place]  (02/06/2022)   Overall Financial Resource Strain (CARDIA)    Difficulty of Paying Living Expenses: Not hard at all  Food Insecurity: No Food Insecurity (02/06/2022)   Hunger Vital Sign    Worried About Running Out of Food in the Last Year: Never true    [place] in the Last Year: Never true  Transportation Needs: No Transportation Needs (02/06/2022)   PRAPARE - Hydrologist (Medical): No    Lack of Transportation (Non-Medical): No  Physical Activity: Inactive (02/06/2022)   Exercise Vital Sign    Days of Exercise per Week: 0 days    Minutes of Exercise per Session: 0 min  Stress: No Stress Concern Present (02/06/2022)   [place]    Feeling of Stress : Not at all  Social Connections: [place] (02/06/2022)   Social Connection and Isolation Panel [NHANES]    Frequency of Communication with Friends and Family: More than three times a week    Frequency of Social Gatherings with Friends and Family: More than three times a week    Attends Religious Services: More than 4 times per year    Active Member of [employer] or Organizations: Yes    Attends Archivist Meetings: More than 4 times per year    Marital Status: Married  Human resources officer Violence: Not At Risk (02/06/2022)   Humiliation, Afraid, Rape, and Kick questionnaire    Fear  of Current or Ex-Partner: No    Emotionally Abused: No    Physically Abused: No    Sexually Abused: No     Review of Systems: General: negative for chills, fever, night sweats or weight changes.  Cardiovascular: negative for chest pain, dyspnea on exertion, edema, orthopnea, palpitations, paroxysmal nocturnal dyspnea or shortness of breath Dermatological: negative for rash Respiratory: negative for cough or wheezing Urologic: negative for hematuria Abdominal: negative for nausea, vomiting, diarrhea, bright red blood per rectum, melena, or hematemesis Neurologic: negative for visual changes, syncope, or dizziness All other systems reviewed and are otherwise negative except as noted above.    Blood pressure 134/78, pulse 72, height '5\' 3"'$  (1.6 m), weight 133 lb 12.8 oz (60.7 kg), SpO2 93 %.  General appearance: alert and no distress Neck: no adenopathy, no carotid bruit, no JVD, supple, symmetrical, trachea midline, and thyroid not enlarged, symmetric, no tenderness/mass/nodules Lungs: clear to auscultation bilaterally Heart: regular rate and rhythm, S1, S2 normal, no murmur, click, rub or gallop Extremities: extremities normal, atraumatic, no cyanosis or edema Pulses: 2+ and symmetric Skin: Skin color, texture, turgor normal. No rashes or  lesions Neurologic: Grossly normal  EKG sinus rhythm at 72 without ST or T wave changes.  Personally reviewed this EKG.  ASSESSMENT AND PLAN:   Essential hypertension History of essential hypertension a blood pressure measured today at 134/78.  She is on amlodipine and metoprolol.  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 Roxy Manns 05/21/2021 with a 38 mm Medtronic SIMUFORM ring.  She also had left atrial appendage clipping at the time.  Her most recent 2D echocardiogram performed 06/27/2022 revealed an EF of 45 to 50% representing a slight improvement compared to the most recent echo 07/03/2021 with trivial MR.  This will be repeated on an annual basis.  She does state that her dyspnea has resolved and her fatigue is markedly improved as well.  Paroxysmal atrial fibrillation (HCC) History of PAF maintaining sinus rhythm on Coumadin anticoagulation.  She did have left atrial appendage clipping.  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  A event monitor did not show any atrial fibrillation.  Hyperlipidemia History of hyperlipidemia on statin therapy with lipid profile performed 07/30/2021 revealing total cholesterol 164, LDL of 95 and HDL 47.  It should be noted that she did have normal coronary arteries at the time of cardiac catheterization.     Lorretta Harp MD FACP,FACC,FAHA, [facility] 07/07/2022 4:10 PM

## 2022-07-07 NOTE — Assessment & Plan Note (Signed)
History of hyperlipidemia on statin therapy with lipid profile performed 07/30/2021 revealing total cholesterol 164, LDL of 95 and HDL 47.  It should be noted that she did have normal coronary arteries at the time of cardiac catheterization.

## 2022-07-07 NOTE — Patient Instructions (Signed)
TAKE 2 tablets today only and then INCREASE TO 1.5 tablets daily except for 1 tablet on Wednesday. Recheck INR in 2 weeks.  Coumadin Clinic [phone];

## 2022-07-07 NOTE — Assessment & Plan Note (Signed)
History of PAF maintaining sinus rhythm on Coumadin anticoagulation.  She did have left atrial appendage clipping.  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  A event monitor did not show any atrial fibrillation.

## 2022-07-10 NOTE — Telephone Encounter (Addendum)
Pt ready for scheduling on or after 08/14/22  Out-of-pocket cost due at time of visit: $301  Primary: [employer] Prolia co-insurance: 20% (approximately $276) Admin fee co-insurance: 20% (approximately $25)  Secondary: n/a Prolia co-insurance:  Admin fee co-insurance:   Deductible: does not apply  Prior Auth: not required PA# Valid:   ** This summary of benefits is an estimation of the patient's out-of-pocket cost. Exact cost may vary based on individual plan coverage.

## 2022-07-13 ENCOUNTER — Other Ambulatory Visit: Payer: Self-pay | Admitting: Cardiovascular Disease

## 2022-07-13 DIAGNOSIS — I480 Paroxysmal atrial fibrillation: Secondary | ICD-10-CM

## 2022-07-14 NOTE — Telephone Encounter (Signed)
Prescription refill request received for warfarin Lov: 07/07/22 Gwenlyn Found) Next INR check: 07/22/22 Warfarin tablet strength: '2mg'$   Appropriate dose and refill sent to requested pharmacy.

## 2022-07-21 NOTE — Progress Notes (Unsigned)
[place] Phone: [phone] Subjective:   [name] [name], am serving as a scribe for Dr. Hulan Saas.   I'm seeing this patient by the request  of:  Martin[place], Betty G, MD  CC: Low back pain follow-up, shoulder pain with follow-up  HAL:[id number]  05/22/2022 Chronic pain with exacerbation.  Home exercises given.  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  Patient has had significant number of different medical problems over the course of time.  Patient wants to hold on anything such as of formal physical therapy at the moment.  We will try the home exercises, continue the gabapentin if it is helpful.  Follow-up again in 6 to 8 weeks.  Patient given injection and tolerated the procedure well.  Discussed icing regimen and home exercises.  Discussed bracing at night.  Patient likely will do well.  Has had an injection on the contralateral side.  Follow-up again in 6 to 8 weeks  Patient an injection previously and does now have a Dupuytren contracture starting minorly.  Patient does have full range of motion of the moment and good grip strength.  Discussed that on ultrasound appears to be more than superficial but likely would not have any difficulty.  Update 07/22/2022 [name] [name] is a 76 y.o. female coming in with complaint of B hand trigger finger and LBP. L pinky and L ring finger triggering. Patient states that her pain in hands are better.   Patient feels pain in R side of lumbar spine with twisting and flexion. Denies any radiating symptoms. Using tylenol for pain.   Also having cramping in both legs throughout the night.   Also having R shoulder pain that is constant. Pain in shoulder seems to be worse with picking up heavy.        Past Medical History:  Diagnosis Date   Arthritis    Atypical chest pain    Diverticulosis    Gastritis 11/08/2007, 04/12/2012   H pylori bx  neg 03/2012   GERD (gastroesophageal reflux disease) 11/08/2007, 04/12/2012   Hepatic cyst    Hiatal hernia 11/08/2007, 04/12/2012   History of kidney stones    Hyperlipidemia    Hypertension    IBS (irritable bowel syndrome)    diarrhea predom   Migraine    Mitral regurgitation    Osteoporosis    DEXA 01/21/2012: -2.1 spine and R fem, -1.8 L fem s/p fosamax  2004-2011 DEXA 04/18/14 @ [facility]: -2.5 - rec to start Prolia     PVC's (premature ventricular contractions)    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   Complex valvuloplasty including PTFE neochord placement x6 with 30 mm Medtronic Simuform ring annuloplasty with clipping of LA appendage   Schatzki's ring 11/11/2010   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   Shingles outbreak 04/2013   Past Surgical History:  Procedure Laterality Date   ANTERIOR CERVICAL DECOMP/DISCECTOMY FUSION N/A 05/06/2022   Procedure: CERVICAL THREE-FOUR, CERVICAL FOUR-FIVE ANTERIOR CERVICAL DECOMPRESSION/DISCECTOMY FUSION;  Surgeon: Earnie Larsson, MD;  Location: [facility];  Service: Neurosurgery;  Laterality: N/A;   [place] STUDY  03/14/2021   Procedure: BUBBLE STUDY;  Surgeon: Sueanne Margarita, MD;  Location: [facility];  Service: Cardiovascular;;   CARDIAC CATHETERIZATION     CLIPPING OF ATRIAL APPENDAGE N/A 05/21/2021   Procedure: CLIPPING OF ATRIAL APPENDAGE USING ATRICURE 40MM PRO2 CLIP;  Surgeon: Rexene Alberts, MD;  Location: [facility];  Service: Open Heart Surgery;  Laterality: N/A;   COLONOSCOPY     CYSTOCELE REPAIR  2008   ESOPHAGOGASTRODUODENOSCOPY     Maxilofacial  1992   MITRAL VALVE REPAIR  05/21/2021   Procedure: MINIMALLY INVASIVE MITRAL VALVE REPAIR (MVR) USING MEDTRONIC SIMUFORM 30MM RING;  Surgeon: Rexene Alberts, MD;  Location: [facility];  Service: Open Heart Surgery;;   RIGHT/LEFT HEART CATH AND CORONARY ANGIOGRAPHY N/A 03/14/2021   Procedure: RIGHT/LEFT HEART CATH AND CORONARY ANGIOGRAPHY;  Surgeon: Lorretta Harp, MD;  Location: [facility];  Service: Cardiovascular;  Laterality: N/A;   TEE WITHOUT CARDIOVERSION N/A 03/14/2021   Procedure: TRANSESOPHAGEAL ECHOCARDIOGRAM (TEE);  Surgeon: Sueanne Margarita, MD;  Location: [facility] ENDOSCOPY;  Service: Cardiovascular;  Laterality: N/A;   TEE WITHOUT CARDIOVERSION N/A 05/21/2021   Procedure: TRANSESOPHAGEAL ECHOCARDIOGRAM (TEE);  Surgeon: Rexene Alberts, MD;  Location: [facility];  Service: Open Heart Surgery;  Laterality: N/A;   TONSILLECTOMY  1960   Social History   Socioeconomic History   Marital status: Married    Spouse name: [name]   Number of children: 3   Years of education: Not on file   Highest education level: Master's degree (e.g., MA, MS, MEng, MEd, MSW, MBA)  Occupational History   Occupation: retired  Tobacco Use   Smoking status: Never   Smokeless tobacco: Never  Vaping Use   Vaping Use: Never used  Substance and Sexual Activity   Alcohol use: [name]   Drug use: [name]   Sexual activity: Not on file  Other Topics Concern   Not on file  Social History Narrative   Married, lives with spouse. Retired Licensed conveyancer, Print production planner. Has 3 kids (moved to [place] to be closer)- youngest son MD. Dorie Rank to [place] from [place] 06/2010, lived in [place] x 10years   Social Determinants of Health   Financial Resource Strain: [place]  (02/06/2022)   Overall Financial Resource Strain (CARDIA)    Difficulty of Paying Living Expenses: Not hard at all  Food Insecurity: [name] Food Insecurity (02/06/2022)   Hunger Vital Sign    Worried About Running Out of Food in the Last Year: Never true    [place] in the Last Year: Never true  Transportation Needs: [name] Transportation Needs (02/06/2022)   PRAPARE - Hydrologist (Medical): [name]    Lack of Transportation (Non-Medical): [name]  Physical Activity: Inactive (02/06/2022)   Exercise Vital Sign    Days of Exercise per Week: 0 days    Minutes of Exercise per Session: 0 min  Stress: [name] Stress  Concern Present (02/06/2022)   [place]    Feeling of Stress : Not at all  Social Connections: [place] (02/06/2022)   Social Connection and Isolation Panel [NHANES]    Frequency of Communication with Friends and Family: More than three times a week    Frequency of Social Gatherings with Friends and Family: More than three times a week    Attends Religious Services: More than 4 times per year    Active Member of [employer] or Organizations: Yes    Attends Music therapist: More than 4 times per year    Marital Status: Married   [name] Known Allergies Family History  Problem Relation Age of Onset   Hypertension Mother    Alzheimer's disease Mother 23   AAA (abdominal aortic aneurysm) Mother    Stroke  Father 24   Kidney cancer Brother        mets   Bone cancer Brother 42   Colon cancer Neg Hx      Current Outpatient Medications (Cardiovascular):    atorvastatin (LIPITOR) 40 MG tablet, Take 1 tablet (40 mg total) by mouth daily. (Patient taking differently: Take 40 mg by mouth at bedtime.)   metoprolol succinate (TOPROL-XL) 25 MG 24 hr tablet, TAKE ONE TABLET BY MOUTH EVERY MORNING and TAKE ONE TABLET BY MOUTH EVERYDAY AT BEDTIME   amLODipine (NORVASC) 2.5 MG tablet, Take 1 tablet (2.5 mg total) by mouth daily. Take 1 tablet in the evening (Patient taking differently: Take 2.5 mg by mouth every evening.)   amLODipine (NORVASC) 5 MG tablet, Take 1 tablet (5 mg total) by mouth daily. (Patient taking differently: Take 5 mg by mouth every morning.)   Current Outpatient Medications (Analgesics):    acetaminophen (TYLENOL) 650 MG CR tablet, Take 650 mg by mouth every 8 (eight) hours as needed for pain (Headache).   aspirin EC 81 MG EC tablet, Take 1 tablet (81 mg total) by mouth daily. Swallow whole.  Current Outpatient Medications (Hematological):    Cyanocobalamin (VITAMIN B-12) 2500 MCG SUBL, Place 2,500  mcg under the tongue daily.   warfarin (COUMADIN) 2 MG tablet, TAKE 1 AND 1/2 TABLETS BY MOUTH DAILY EXCEPT 1 TABLET ON WEDNESDAYS OR AS DIRECTED BY COUMADIN CLINIC  Current Outpatient Medications (Other):    carbamide peroxide (DEBROX) 6.5 % OTIC solution, Place 5 drops into the left ear 2 (two) times daily.   cyclobenzaprine (FLEXERIL) 10 MG tablet, Take 1 tablet (10 mg total) by mouth 3 (three) times daily as needed for muscle spasms.   gabapentin (NEURONTIN) 100 MG capsule, TAKE ONE CAPSULE BY MOUTH EVERY MORNING and TAKE TWO CAPSULES BY MOUTH EVERYDAY AT BEDTIME   Peppermint Oil (IBGARD) 90 MG CPCR, Use as needed (Patient taking differently: Take 90 mg by mouth daily as needed (Stomach control). Use as needed)   Polyethyl Glycol-Propyl Glycol (SYSTANE) 0.4-0.3 % SOLN, Place 1 drop into both eyes 2 (two) times daily.   Reviewed prior external information including notes and imaging from  primary care provider As well as notes that were available from care everywhere and other healthcare systems.  Past medical history, social, surgical and family history all reviewed in electronic medical record.  [name] pertanent information unless stated regarding to the chief complaint.   Review of Systems:  [name] headache, visual changes, nausea, vomiting, diarrhea, constipation, dizziness, abdominal pain, skin rash, fevers, chills, night sweats, weight loss, swollen lymph nodes, body aches, joint swelling, chest pain, shortness of breath, mood changes. POSITIVE muscle aches  Objective  Blood pressure 112/62, pulse (!) 39, height '5\' 3"'$  (1.6 m), SpO2 98 %.   General: [name] apparent distress alert and oriented x3 mood and affect normal, dressed appropriately.  HEENT: Pupils equal, extraocular movements intact  Respiratory: Patient's speak in full sentences and does not appear short of breath  Cardiovascular: [name] lower extremity edema, non tender, [name] erythema  Antalgic gait.  Difficulty getting out of the chair  from a seated to standing position.  Positive straight leg test on the right side.  Worsening pain with flexion extension of the back.  Limited rotation secondary to discomfort as well.  Shoulder: Right Inspection reveals [name] abnormalities, atrophy or asymmetry. Palpation is normal with [name] tenderness over AC joint or bicipital groove. ROM is full in all planes passively. Rotator cuff strength normal throughout.  signs of impingement with positive Neer and Hawkin's tests, but negative empty can sign. Speeds and Yergason's tests normal. [name] labral pathology noted with negative Obrien's, negative clunk and good stability. Normal scapular function observed. [name] painful arc and [name] drop arm sign. [name] apprehension sign   Procedure: Real-time Ultrasound Guided Injection of right glenohumeral joint Device: GE Logiq E  Ultrasound guided injection is preferred based studies that show increased duration, increased effect, greater accuracy, decreased procedural pain, increased response rate with ultrasound guided versus blind injection.  Verbal informed consent obtained.  Time-out conducted.  Noted [name] overlying erythema, induration, or other signs of local infection.  Skin prepped in a sterile fashion.  Local anesthesia: Topical Ethyl chloride.  With sterile technique and under real time ultrasound guidance:  Joint visualized.  23g 1  inch needle inserted posterior approach. Pictures taken for needle placement. Patient did have injection of 2 cc of 1% lidocaine, 2 cc of 0.5% Marcaine, and 1.0 cc of Kenalog 40 mg/dL. Completed without difficulty  Pain immediately resolved suggesting accurate placement of the medication.  Advised to call if fevers/chills, erythema, induration, drainage, or persistent bleeding.  Images permanently stored and available for review in the ultrasound unit.  Impression: Technically successful ultrasound guided injection.    Impression and Recommendations:    The above  documentation has been reviewed and is accurate and complete Lyndal Pulley, DO

## 2022-07-22 ENCOUNTER — Encounter: Payer: Self-pay | Admitting: Family Medicine

## 2022-07-22 ENCOUNTER — Ambulatory Visit: Payer: Self-pay

## 2022-07-22 ENCOUNTER — Ambulatory Visit: Payer: PPO | Attending: Cardiovascular Disease

## 2022-07-22 ENCOUNTER — Ambulatory Visit: Payer: PPO | Admitting: Family Medicine

## 2022-07-22 ENCOUNTER — Other Ambulatory Visit: Payer: Self-pay | Admitting: Neurology

## 2022-07-22 VITALS — BP 112/62 | HR 39 | Ht 63.0 in

## 2022-07-22 DIAGNOSIS — M7551 Bursitis of right shoulder: Secondary | ICD-10-CM

## 2022-07-22 DIAGNOSIS — Z9889 Other specified postprocedural states: Secondary | ICD-10-CM

## 2022-07-22 DIAGNOSIS — I340 Nonrheumatic mitral (valve) insufficiency: Secondary | ICD-10-CM

## 2022-07-22 DIAGNOSIS — Z5181 Encounter for therapeutic drug level monitoring: Secondary | ICD-10-CM

## 2022-07-22 DIAGNOSIS — M79642 Pain in left hand: Secondary | ICD-10-CM | POA: Diagnosis not present

## 2022-07-22 DIAGNOSIS — G8929 Other chronic pain: Secondary | ICD-10-CM | POA: Diagnosis not present

## 2022-07-22 DIAGNOSIS — Z7901 Long term (current) use of anticoagulants: Secondary | ICD-10-CM

## 2022-07-22 DIAGNOSIS — M545 Low back pain, unspecified: Secondary | ICD-10-CM | POA: Diagnosis not present

## 2022-07-22 DIAGNOSIS — G4486 Cervicogenic headache: Secondary | ICD-10-CM

## 2022-07-22 DIAGNOSIS — M79641 Pain in right hand: Secondary | ICD-10-CM | POA: Diagnosis not present

## 2022-07-22 HISTORY — DX: Bursitis of right shoulder: M75.51

## 2022-07-22 LAB — POCT INR: INR: 2.9 (ref 2.0–3.0)

## 2022-07-22 NOTE — Assessment & Plan Note (Signed)
Patient given injection and tolerated the procedure well.  Discussed with patient about icing regimen and home exercises.  Discussed with patient worsening pain advanced imaging would be warranted.  Hopeful that patient will continue to make slow improvement.  Follow-up with me again in 6 to 8 weeks

## 2022-07-22 NOTE — Assessment & Plan Note (Signed)
Chronic low back pain with exacerbation and worsening symptoms.  Starting to affect daily activities.  He does have some progression of the spondylolisthesis noted on x-ray at L4-L5.  At this point with patient having radicular symptoms consistent with spinal stenosis I do feel an MRI of the lumbar spine would be beneficial.  Depending on findings could be a candidate for possible injections but would have to have her hold her Coumadin and will need to ask primary care as well this cardiology.  Follow-up with me again after imaging to discuss further.

## 2022-07-22 NOTE — Patient Instructions (Signed)
Continue 1.5 tablets daily except for 1 tablet on Wednesday. Recheck INR in 4 weeks.  Coumadin Clinic [phone];

## 2022-07-22 NOTE — Patient Instructions (Signed)
Injected shoulder today MRI [facility] [phone] See me in 2 months

## 2022-07-31 ENCOUNTER — Other Ambulatory Visit: Payer: Self-pay | Admitting: Family Medicine

## 2022-07-31 DIAGNOSIS — M4310 Spondylolisthesis, site unspecified: Secondary | ICD-10-CM | POA: Diagnosis not present

## 2022-07-31 DIAGNOSIS — M545 Low back pain, unspecified: Secondary | ICD-10-CM

## 2022-07-31 DIAGNOSIS — M542 Cervicalgia: Secondary | ICD-10-CM

## 2022-08-05 ENCOUNTER — Other Ambulatory Visit: Payer: PPO

## 2022-08-11 ENCOUNTER — Other Ambulatory Visit: Payer: Self-pay | Admitting: Cardiovascular Disease

## 2022-08-14 ENCOUNTER — Ambulatory Visit: Payer: PPO

## 2022-08-14 ENCOUNTER — Telehealth: Payer: Self-pay | Admitting: Family Medicine

## 2022-08-14 NOTE — Telephone Encounter (Signed)
Patient wants to get her Prolia injection but wants it run through her insurance first.  Please give her a call on her cell phone.

## 2022-08-14 NOTE — Telephone Encounter (Signed)
Submitted via [employer] portal.

## 2022-08-19 ENCOUNTER — Ambulatory Visit: Payer: PPO | Attending: Cardiovascular Disease | Admitting: *Deleted

## 2022-08-19 DIAGNOSIS — I340 Nonrheumatic mitral (valve) insufficiency: Secondary | ICD-10-CM | POA: Diagnosis not present

## 2022-08-19 DIAGNOSIS — Z7901 Long term (current) use of anticoagulants: Secondary | ICD-10-CM | POA: Diagnosis not present

## 2022-08-19 DIAGNOSIS — Z9889 Other specified postprocedural states: Secondary | ICD-10-CM | POA: Diagnosis not present

## 2022-08-19 LAB — POCT INR: INR: 2.6 (ref 2.0–3.0)

## 2022-08-19 NOTE — Patient Instructions (Signed)
Description   Continue taking 1.5 tablets daily except for 1 tablet on Wednesday. Recheck INR in 4 weeks. [facility] [phone];

## 2022-08-20 NOTE — Telephone Encounter (Signed)
I called and left patient a voicemail with the information below.

## 2022-08-21 ENCOUNTER — Other Ambulatory Visit: Payer: Self-pay | Admitting: Neurology

## 2022-08-21 DIAGNOSIS — G4486 Cervicogenic headache: Secondary | ICD-10-CM

## 2022-08-25 NOTE — Progress Notes (Unsigned)
ACUTE VISIT Chief Complaint  Patient presents with   Blood Pressure Check   HPI: [name] is a 76 y.o. female, who is here today with her complaining of *** HPI Hypertension:  Medications:*** BP readings at home:*** Side effects:***  Negative for unusual or severe headache, visual changes, exertional chest pain, dyspnea,  focal weakness, or edema.  Lab Results  Component Value Date   CREATININE 0.95 07/01/2022   BUN 28 (H) 07/01/2022   NA 139 07/01/2022   K 4.0 07/01/2022   CL 102 07/01/2022   CO2 26 07/01/2022   Headache: *** TMJ pain ***  Review of Systems  Constitutional:  Negative for activity change, appetite change and fever.  HENT:  Negative for mouth sores, nosebleeds and sore throat.   Eyes:  Negative for redness and visual disturbance.  Respiratory:  Negative for cough, shortness of breath and wheezing.   Cardiovascular:  Negative for chest pain, palpitations and leg swelling.  Gastrointestinal:  Positive for anal bleeding. Negative for abdominal pain, nausea and vomiting.       Negative for changes in bowel habits.  Genitourinary:  Negative for decreased urine volume and hematuria.  Neurological:  Negative for seizures, syncope, weakness, numbness and headaches.  Psychiatric/Behavioral: Negative.     Rest see pertinent positives and negatives per HPI.  Current Outpatient Medications on File Prior to Visit  Medication Sig Dispense Refill   acetaminophen (TYLENOL) 650 MG CR tablet Take 650 mg by mouth every 8 (eight) hours as needed for pain (Headache).     amLODipine (NORVASC) 2.5 MG tablet Take 1 tablet (2.5 mg total) by mouth daily. Take 1 tablet in the evening (Patient taking differently: Take 2.5 mg by mouth every evening.) 90 tablet 3   amLODipine (NORVASC) 5 MG tablet Take 1 tablet (5 mg total) by mouth daily. (Patient taking differently: Take 5 mg by mouth every morning.) 180 tablet 3   aspirin EC 81 MG EC tablet Take 1 tablet (81 mg  total) by mouth daily. Swallow whole. 30 tablet 11   atorvastatin (LIPITOR) 40 MG tablet Take 1 tablet (40 mg total) by mouth daily. (Patient taking differently: Take 40 mg by mouth at bedtime.) 90 tablet 3   carbamide peroxide (DEBROX) 6.5 % OTIC solution Place 5 drops into the left ear 2 (two) times daily. 15 mL 0   Cyanocobalamin (VITAMIN B-12) 2500 MCG SUBL Place 2,500 mcg under the tongue daily.     gabapentin (NEURONTIN) 100 MG capsule TAKE ONE CAPSULE BY MOUTH EVERY MORNING and TAKE TWO CAPSULES BY MOUTH EVERYDAY AT BEDTIME 90 capsule 0   metoprolol succinate (TOPROL-XL) 25 MG 24 hr tablet TAKE ONE TABLET BY MOUTH EVERY MORNING and TAKE ONE TABLET BY MOUTH EVERY EVENING 180 tablet 2   Peppermint Oil (IBGARD) 90 MG CPCR Use as needed (Patient taking differently: Take 90 mg by mouth daily as needed (Stomach control). Use as needed)     Polyethyl Glycol-Propyl Glycol (SYSTANE) 0.4-0.3 % SOLN Place 1 drop into both eyes 2 (two) times daily.     warfarin (COUMADIN) 2 MG tablet TAKE 1 AND 1/2 TABLETS BY MOUTH DAILY EXCEPT 1 TABLET ON WEDNESDAYS OR AS DIRECTED BY COU[facility]ADIN CLINIC 45 tablet 1   No current facility-administered medications on file prior to visit.     Past Medical History:  Diagnosis Date   Arthritis    Atypical chest pain    Diverticulosis    Gastritis 11/08/2007, 04/12/2012   H pylori  bx neg 03/2012   GERD (gastroesophageal reflux disease) 11/08/2007, 04/12/2012   Hepatic cyst    Hiatal hernia 11/08/2007, 04/12/2012   History of kidney stones    Hyperlipidemia    Hypertension    IBS (irritable bowel syndrome)    diarrhea predom   Migraine    Mitral regurgitation    Osteoporosis    DEXA 01/21/2012: -2.1 spine and R fem, -1.8 L fem s/p fosamax  2004-2011 DEXA 04/18/14 @ [facility]: -2.5 - rec to start Prolia     PVC's (premature ventricular contractions)    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   Complex valvuloplasty including PTFE neochord placement x6 with 30 mm  Medtronic Simuform ring annuloplasty with clipping of LA appendage   Schatzki's ring 11/11/2010   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   Shingles outbreak 04/2013   No Known Allergies  Social History   Socioeconomic History   Marital status: Married    Spouse name: [name]   Number of children: 3   Years of education: Not on file   Highest education level: Master's degree (e.g., MA, MS, MEng, MEd, MSW, MBA)  Occupational History   Occupation: retired  Tobacco Use   Smoking status: Never   Smokeless tobacco: Never  Vaping Use   Vaping Use: Never used  Substance and Sexual Activity   Alcohol use: No   Drug use: No   Sexual activity: Not on file  Other Topics Concern   Not on file  Social History Narrative   Married, lives with spouse. Retired Licensed conveyancer, Print production planner. Has 3 kids (moved to [place] to be closer)- youngest son MD. Dorie Rank to [place] from [place] 06/2010, lived in [place] x 10years   Social Determinants of Health   Financial Resource Strain: [place]  (02/06/2022)   Overall Financial Resource Strain (CARDIA)    Difficulty of Paying Living Expenses: Not hard at all  Food Insecurity: No Food Insecurity (02/06/2022)   Hunger Vital Sign    Worried About Running Out of Food in the Last Year: Never true    [place] in the Last Year: Never true  Transportation Needs: No Transportation Needs (02/06/2022)   PRAPARE - Hydrologist (Medical): No    Lack of Transportation (Non-Medical): No  Physical Activity: Inactive (02/06/2022)   Exercise Vital Sign    Days of Exercise per Week: 0 days    Minutes of Exercise per Session: 0 min  Stress: No Stress Concern Present (02/06/2022)   [place] of Stress : Not at all  Social Connections: [place] (02/06/2022)   Social Connection and Isolation Panel [NHANES]    Frequency of Communication with Friends and Family:  More than three times a week    Frequency of Social Gatherings with Friends and Family: More than three times a week    Attends Religious Services: More than 4 times per year    Active Member of [employer] or Organizations: Yes    Attends Archivist Meetings: More than 4 times per year    Marital Status: Married    Vitals:   08/26/22 1034  BP: 130/60  Pulse: (!) 41  Resp: 12  Temp: 98.7 F (37.1 C)  SpO2: 98%   Body mass index is 23.45 kg/m.  Physical Exam Vitals and nursing note reviewed.  Constitutional:      General: She is not in acute distress.  Appearance: She is well-developed.  HENT:     Head: Normocephalic and atraumatic.     Right Ear: External ear normal.     Left Ear: Tympanic membrane, ear canal and external ear normal.     Ears:     Comments: Cerumen excess righ ear canal, not able to see TM.    Mouth/Throat:     Mouth: Mucous membranes are moist.     Pharynx: Oropharynx is clear.     Comments: Left TMJ mild limitation of movement, no significant pain with opening mouth , pain with palpation. No pain around temporal area. *** Eyes:     Conjunctiva/sclera: Conjunctivae normal.  Cardiovascular:     Rate and Rhythm: Normal rate and regular rhythm.     Pulses:          Dorsalis pedis pulses are 2+ on the right side and 2+ on the left side.     Heart sounds: No murmur heard. Pulmonary:     Effort: Pulmonary effort is normal. No respiratory distress.     Breath sounds: Normal breath sounds.  Abdominal:     Palpations: Abdomen is soft. There is no hepatomegaly or mass.     Tenderness: There is no abdominal tenderness.  Lymphadenopathy:     Cervical: No cervical adenopathy.  Skin:    General: Skin is warm.     Findings: No erythema or rash.  Neurological:     General: No focal deficit present.     Mental Status: She is alert and oriented to person, place, and time.     Cranial Nerves: No cranial nerve deficit.     Gait: Gait normal.   Psychiatric:     Comments: Well groomed, good eye contact.     ASSESSMENT AND PLAN:  Sarie was seen today for blood pressure check.  Diagnoses and all orders for this visit:  Headache, unspecified headache type  Rectal bleeding  Essential hypertension  TMJ pain dysfunction syndrome  Localized osteoporosis without current pathological fracture -     denosumab (PROLIA) injection 60 mg     Return in about 2 months (around 10/26/2022).   Phinley Schall G. [place], MD  [facility] [place] office.

## 2022-08-26 ENCOUNTER — Encounter: Payer: Self-pay | Admitting: Family Medicine

## 2022-08-26 ENCOUNTER — Ambulatory Visit: Payer: PPO | Admitting: Family Medicine

## 2022-08-26 VITALS — BP 130/60 | HR 41 | Temp 98.7°F | Resp 12 | Ht 63.0 in | Wt 132.4 lb

## 2022-08-26 DIAGNOSIS — M26629 Arthralgia of temporomandibular joint, unspecified side: Secondary | ICD-10-CM | POA: Diagnosis not present

## 2022-08-26 DIAGNOSIS — R519 Headache, unspecified: Secondary | ICD-10-CM

## 2022-08-26 DIAGNOSIS — I10 Essential (primary) hypertension: Secondary | ICD-10-CM

## 2022-08-26 DIAGNOSIS — M816 Localized osteoporosis [Lequesne]: Secondary | ICD-10-CM | POA: Diagnosis not present

## 2022-08-26 DIAGNOSIS — K625 Hemorrhage of anus and rectum: Secondary | ICD-10-CM | POA: Diagnosis not present

## 2022-08-26 MED ORDER — DENOSUMAB 60 MG/ML ~~LOC~~ SOSY
60.0000 mg | PREFILLED_SYRINGE | Freq: Once | SUBCUTANEOUS | Status: AC
  Administered 2022-08-26: 60 mg via SUBCUTANEOUS

## 2022-08-26 NOTE — Patient Instructions (Addendum)
A few things to remember from today's visit:  Headache, unspecified headache type  Rectal bleeding  Essential hypertension  TMJ pain dysfunction syndrome Disminuya la dosis de Gabapentin de 2 cap a 1 diaria y si no nita cambios en el dolor de cabeza puede disminuirla a da por media por 2 semanas y lo para. Amlodipina de la noche lo puede parar. Siga tomndose la presin arterial. Mantenga la cita con Technical sales engineer.  Please be sure medication list is accurate. If a new problem present, please set up appointment sooner than planned today.

## 2022-09-05 ENCOUNTER — Ambulatory Visit: Payer: PPO | Admitting: Family Medicine

## 2022-09-10 ENCOUNTER — Other Ambulatory Visit: Payer: Self-pay | Admitting: Cardiovascular Disease

## 2022-09-10 DIAGNOSIS — I480 Paroxysmal atrial fibrillation: Secondary | ICD-10-CM

## 2022-09-10 NOTE — Telephone Encounter (Signed)
Last OV 07/07/2022 Last INR 08/19/2022

## 2022-09-16 ENCOUNTER — Ambulatory Visit: Payer: PPO | Attending: Cardiovascular Disease | Admitting: *Deleted

## 2022-09-16 DIAGNOSIS — Z9889 Other specified postprocedural states: Secondary | ICD-10-CM

## 2022-09-16 DIAGNOSIS — I340 Nonrheumatic mitral (valve) insufficiency: Secondary | ICD-10-CM | POA: Diagnosis not present

## 2022-09-16 DIAGNOSIS — Z7901 Long term (current) use of anticoagulants: Secondary | ICD-10-CM | POA: Diagnosis not present

## 2022-09-16 LAB — POCT INR: INR: 2.1 (ref 2.0–3.0)

## 2022-09-16 NOTE — Patient Instructions (Addendum)
Description   Continue taking 1.5 tablets daily except for 1 tablet on Wednesday. Recheck INR in 5 weeks. [facility] [phone];

## 2022-09-16 NOTE — Progress Notes (Unsigned)
Cardiology Office Note:    Date:  09/18/2022   ID:  [name] [name], DOB 1946/01/09, MRN [id number]  PCP:  Martinique, Betty G, MD   [facility] Providers Cardiologist:  Quay Burow, MD      Referring MD: Martinique, Betty G, MD   Presents to the clinic today for evaluation of her elevated blood pressure.  History of Present Illness:    [name] [name] is a 76 y.o. female with a hx of essential hypertension, cardiomegaly, CHF, pleural effusion, GERD, gastritis, hyperlipidemia, chronic low back pain, prediabetes, chest pain, and cardiac murmur.   She was  seen by Dr. Gwenlyn Found for complaints of atypical chest pain.  She had never had a heart attack or stroke.  She was noted to have seen Dr. Sallyanne Kuster back in 2011 for atypical chest pain work-up was negative which included nuclear stress test.  She has been out of the the country for several months and had returned to the [place] for about a month.  Since that time she reported daily chest pain.  She had been under a lot of stress.  Her symptomology was felt to be related to GERD.   She was seen by Dr. Gwenlyn Found on 04/08/2019.  During that time she was doing fairly well.  She noticed increased dyspnea with exertion over the last several months.  She complained of atypical chest pain which was more positional.  She described reproducible pain with pushing on her sternum.  She reported it was similar to pain she had 10 years prior.  At that time she had a negative stress test.  It was not felt to be anginal.  Her blood pressure was well controlled on clonidine losartan and metoprolol.   She presented to the clinic 03/01/21 to review her echocardiogram.  She stated she had noticed increased shortness of breath over the last 2 weeks.  We reviewed her echocardiogram.  Her son presented with her. Case discussed with Dr. Gwenlyn Found.  A left and right heart cath, CBC/BMP, as well as TEE were planned.   Shared decision making was used.  All questions were  answered.  She followed up with Dr. Gwenlyn Found on 03/20/2021.  During that time her TEE and right/left heart catheterization were reviewed.  She was accompanied by her sister.  Her son [name] is a family medicine physician with [employer].  She was noted to have a flail posterior leaflet, normal LV function and normal coronary anatomy.  She was referred to Dr. Roxy Manns for further management and treatment of her mitral valve.  She underwent minimally invasive mitral valve repair 05/21/2021 with Medtronic ring and also had atrial appendage clipping.  She was discharged home on 05/26/2021.  She recovered well.  She was seen postoperatively by Dr. Roxy Manns and noted fatigue.  She was seen in follow-up by Dr. Gwenlyn Found on 09/27/2021.  During that time her breathing had significantly improved compared to her preoperative symptoms.  Her echocardiogram was reviewed which showed a decline in her ejection fraction from 50-55 down to 40-45.  No mitral valve regurgitation was noted.  Plan for follow-up echocardiogram in 6 months was discussed.  She contacted the nurse triage line and reported that she was having episodes of elevated blood pressure in the evenings.  She presented to the clinic 10/28/2021 for evaluation and stated over the last several days she had noted increased blood pressures.  She brought a blood pressure log  that showed blood pressures in the 086P-619J systolic.  She had  been taking some clonidine that was from 2019.  We reviewed her medication list and her diet.  She reported that she did use some salt in her cooking.  She had been taking Tylenol as needed for pain.  She also reported that she was previously on losartan but was not taking it currently.  She reported her husband was out of the country getting dental work.  I  started her on amlodipine 5 mg daily, asked her to maintain a blood pressure log to bring to clinic, salty 6 diet sheet, and refilled her atorvastatin.  We planned follow-up for 1 to 2 months.  She  presented the clinic 12/11/21 for follow-up evaluation and stated she felt well.  She continued to have some fatigue.  She brought a blood pressure log which showed improved blood pressures.  They ranged from the 254-270 systolic range with the occasional blood pressure 170s.  Her heart rate using her electric monitor had been ranging 40s.  On review of the clinic her pulse was in the low to mid 70s.  We reviewed the importance of low-sodium diet, limiting caffeine, and regular exercise.  She presented with her husband who stated that half of his dental work had been completed and he would return to have the other half done in the future.  They planned to travel to [place] in February.  I  increased her amlodipine with an addition of half tablet 2.5 mg in the evening and continued her morning dosing.  I gave the salty 6 diet sheet and planned follow-up as scheduled with Dr. Gwenlyn Found.  She was last seen in the clinic by Dr. Gwenlyn Found on 07/07/2022.  She continued to do well at that time.  Her fatigue had somewhat improved.  She had undergone cervical disc surgery by Dr. Trenton Gammon.  She denies chest pain, shortness of breath, and fatigue.  She was compliant with Coumadin.  Her echocardiogram 8/23 showed slight improvement in her ejection fraction up to 45-50%.  No mitral valve regurgitation was noted.  She contacted the nurse triage line on 09/16/2022 and reported intermittent episodes of chest discomfort.  She presents to the clinic today for follow-up evaluation states she has not had any chest discomfort today.  She reports that her chest discomfort is at rest, intermittent, and lasts around a minute.  The discomfort dissipates without intervention.  She notices it on the left side underneath her breast and centrally.  We reviewed her most recent echocardiogram and previous angiography.  She does note that at times she feels that her discomfort may be related to gas type pain.  We reviewed causes of chest discomfort.  She  and her husband expressed understanding.  We reviewed her most recent cholesterol values.  It appears her discomfort is precordial in nature.  She was reassured that her discomfort is not related to cardiac issues.  She expressed understanding.  I will give her the salty 6 diet sheet, have her continue her physical activity, continue current medication regimen, and plan follow-up for 6 months.  Today she denies chest pain,  lower extremity edema, fatigue, palpitations, melena, hematuria, hemoptysis, diaphoresis, weakness, presyncope, syncope, orthopnea, and PND.  Past Medical History:  Diagnosis Date   Arthritis    Atypical chest pain    Diverticulosis    Gastritis 11/08/2007, 04/12/2012   H pylori bx neg 03/2012   GERD (gastroesophageal reflux disease) 11/08/2007, 04/12/2012   Hepatic cyst    Hiatal hernia 11/08/2007, 04/12/2012   History of kidney  stones    Hyperlipidemia    Hypertension    IBS (irritable bowel syndrome)    diarrhea predom   Migraine    Mitral regurgitation    Osteoporosis    DEXA 01/21/2012: -2.1 spine and R fem, -1.8 L fem s/p fosamax  2004-2011 DEXA 04/18/14 @ [facility]: -2.5 - rec to start Prolia     PVC's (premature ventricular contractions)    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   Complex valvuloplasty including PTFE neochord placement x6 with 30 mm Medtronic Simuform ring annuloplasty with clipping of LA appendage   Schatzki's ring 11/11/2010   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   Shingles outbreak 04/2013    Past Surgical History:  Procedure Laterality Date   ANTERIOR CERVICAL DECOMP/DISCECTOMY FUSION N/A 05/06/2022   Procedure: CERVICAL THREE-FOUR, CERVICAL FOUR-FIVE ANTERIOR CERVICAL DECOMPRESSION/DISCECTOMY FUSION;  Surgeon: Earnie Larsson, MD;  Location: [place];  Service: Neurosurgery;  Laterality: N/A;   [place] STUDY  03/14/2021   Procedure: BUBBLE STUDY;  Surgeon: Sueanne Margarita, MD;  Location: [place];  Service:  Cardiovascular;;   CARDIAC CATHETERIZATION     CLIPPING OF ATRIAL APPENDAGE N/A 05/21/2021   Procedure: CLIPPING OF ATRIAL APPENDAGE USING ATRICURE 40MM PRO2 CLIP;  Surgeon: Rexene Alberts, MD;  Location: [place];  Service: Open Heart Surgery;  Laterality: N/A;   COLONOSCOPY     CYSTOCELE REPAIR  2008   ESOPHAGOGASTRODUODENOSCOPY     Maxilofacial  1992   MITRAL VALVE REPAIR  05/21/2021   Procedure: MINIMALLY INVASIVE MITRAL VALVE REPAIR (MVR) USING MEDTRONIC SIMUFORM 30MM RING;  Surgeon: Rexene Alberts, MD;  Location: [place];  Service: Open Heart Surgery;;   RIGHT/LEFT HEART CATH AND CORONARY ANGIOGRAPHY N/A 03/14/2021   Procedure: RIGHT/LEFT HEART CATH AND CORONARY ANGIOGRAPHY;  Surgeon: Lorretta Harp, MD;  Location: [place] [place] [facility];  Service: Cardiovascular;  Laterality: N/A;   TEE WITHOUT CARDIOVERSION N/A 03/14/2021   Procedure: TRANSESOPHAGEAL ECHOCARDIOGRAM (TEE);  Surgeon: Sueanne Margarita, MD;  Location: [facility] END[facility];  Service: Cardiovascular;  Laterality: N/A;   TEE WITHOUT CARDIOVERSION N/A 05/21/2021   Procedure: TRANSESOPHAGEAL ECHOCARDIOGRAM (TEE);  Surgeon: Rexene Alberts, MD;  Location: [place];  Service: Open Heart Surgery;  Laterality: N/A;   TONSILLECTOMY  1960    Current Medications: Current Meds  Medication Sig   acetaminophen (TYLENOL) 650 MG CR tablet Take 650 mg by mouth every 8 (eight) hours as needed for pain (Headache).   amLODipine (NORVASC) 5 MG tablet Take 1 tablet (5 mg total) by mouth daily. (Patient taking differently: Take 5 mg by mouth every morning.)   aspirin EC 81 MG EC tablet Take 1 tablet (81 mg total) by mouth daily. Swallow whole.   atorvastatin (LIPITOR) 40 MG tablet Take 1 tablet (40 mg total) by mouth daily. (Patient taking differently: Take 40 mg by mouth at bedtime.)   carbamide peroxide (DEBROX) 6.5 % OTIC solution Place 5 drops into the left ear 2 (two) times daily.   Cyanocobalamin (VITAMIN B-12) 2500 MCG SUBL Place 2,500 mcg under  the tongue daily.   dicyclomine (BENTYL) 10 MG capsule Take 1 capsule (10 mg total) by mouth 2 (two) times daily.   gabapentin (NEURONTIN) 100 MG capsule TAKE ONE CAPSULE BY MOUTH EVERY MORNING and TAKE TWO CAPSULES BY MOUTH EVERYDAY AT BEDTIME (Patient taking differently: Take 100 mg by mouth at bedtime.)   metoprolol succinate (TOPROL-XL) 25 MG 24 hr tablet TAKE ONE TABLET BY MOUTH EVERY MORNING and  TAKE ONE TABLET BY MOUTH EVERY EVENING (Patient taking differently: TAKE ONE TABLET BY MOUTH EVERY MORNING and TAKE ONE TABLET BY MOUTH EVERY NIGHT)   Peppermint Oil (IBGARD) 90 MG CPCR Use as needed (Patient taking differently: Take 90 mg by mouth daily as needed (Stomach control). Use as needed)   Polyethyl Glycol-Propyl Glycol (SYSTANE) 0.4-0.3 % SOLN Place 1 drop into both eyes 2 (two) times daily.   warfarin (COUMADIN) 2 MG tablet TAKE 1 AND 1/2 TABLETS BY MOUTH DAILY EXCEPT ON WEDNESDAY TAKE ONE TABLET OR AS DIRECTED by coumadin clinic     Allergies:   Patient has no known allergies.   Social History   Socioeconomic History   Marital status: Married    Spouse name: [name]   Number of children: 3   Years of education: Not on file   Highest education level: Master's degree (e.g., MA, MS, MEng, MEd, MSW, MBA)  Occupational History   Occupation: retired  Tobacco Use   Smoking status: Never   Smokeless tobacco: Never  Vaping Use   Vaping Use: Never used  Substance and Sexual Activity   Alcohol use: No   Drug use: No   Sexual activity: Not on file  Other Topics Concern   Not on file  Social History Narrative   Married, lives with spouse. Retired Licensed conveyancer, Print production planner. Has 3 kids (moved to [place] to be closer)- youngest son MD. Dorie Rank to [place] from [place] 06/2010, lived in [place] x 10years   Social Determinants of Health   Financial Resource Strain: [place]  (02/06/2022)   Overall Financial Resource Strain (CARDIA)    Difficulty of Paying Living Expenses: Not hard at all  Food  Insecurity: No Food Insecurity (02/06/2022)   Hunger Vital Sign    Worried About Running Out of Food in the Last Year: Never true    [place] in the Last Year: Never true  Transportation Needs: No Transportation Needs (02/06/2022)   PRAPARE - Hydrologist (Medical): No    Lack of Transportation (Non-Medical): No  Physical Activity: Inactive (02/06/2022)   Exercise Vital Sign    Days of Exercise per Week: 0 days    Minutes of Exercise per Session: 0 min  Stress: No Stress Concern Present (02/06/2022)   [place]    Feeling of Stress : Not at all  Social Connections: [place] (02/06/2022)   Social Connection and Isolation Panel [NHANES]    Frequency of Communication with Friends and Family: More than three times a week    Frequency of Social Gatherings with Friends and Family: More than three times a week    Attends Religious Services: More than 4 times per year    Active Member of [employer] or Organizations: Yes    Attends Music therapist: More than 4 times per year    Marital Status: Married     Family History: The patient's family history includes AAA (abdominal aortic aneurysm) in her mother; Alzheimer's disease (age of onset: 59) in her mother; Bone cancer (age of onset: 6) in her brother; Hypertension in her mother; Kidney cancer in her brother; Stroke (age of onset: 74) in her father. There is no history of Colon cancer.  ROS:   Please see the history of present illness.     All other systems reviewed and are negative.   Risk Assessment/Calculations:  Physical Exam:    VS:  BP 122/78   Pulse 70   Ht '5\' 3"'$  (1.6 m)   Wt 133 lb (60.3 kg)   SpO2 98%   BMI 23.56 kg/m     Wt Readings from Last 3 Encounters:  09/18/22 133 lb (60.3 kg)  09/17/22 134 lb 4 oz (60.9 kg)  08/26/22 132 lb 6 oz (60 kg)     GEN:  Well nourished, well developed in  no acute distress HEENT: Normal NECK: No JVD; No carotid bruits LYMPHATICS: No lymphadenopathy CARDIAC: RRR, no murmurs, rubs, gallops RESPIRATORY:  Clear to auscultation without rales, wheezing or rhonchi  ABDOMEN: Soft, non-tender, non-distended MUSCULOSKELETAL:  No edema; No deformity  SKIN: Warm and dry NEUROLOGIC:  Alert and oriented x 3 PSYCHIATRIC:  Normal affect    EKGs/Labs/Other Studies Reviewed:    The following studies were reviewed today:  Echocardiogram 02/27/2021 IMPRESSIONS     1. Left ventricular ejection fraction, by estimation, is 55 to 60%. The  left ventricle has normal function. The left ventricle has no regional  wall motion abnormalities. Left ventricular diastolic parameters were  normal.   2. Right ventricular systolic function is normal. The right ventricular  size is normal.   3. Left atrial size was moderately dilated.   4. There appears to be a partially flail posterior mitral valve leaflet  with significant turburlent blood flow in the left atrium with at least  moderate MR and may be severe.The mitral valve is abnormal. Moderate  mitral valve regurgitation. No evidence  of mitral stenosis.   5. The aortic valve is normal in structure. Aortic valve regurgitation is  not visualized. Mild aortic valve sclerosis is present, with no evidence  of aortic valve stenosis.   6. The inferior vena cava is normal in size with greater than 50%  respiratory variability, suggesting right atrial pressure of 3 mmHg.R   Recommend TEE for further evaluation of the mitral valve.  Echocardiogram 07/03/2021  IMPRESSIONS     1. Left ventricular ejection fraction, by estimation, is 40 to 45%. The  left ventricle has mildly decreased function. The left ventricle  demonstrates global hypokinesis. Left ventricular diastolic function could  not be evaluated.   2. Right ventricular systolic function is normal. The right ventricular  size is normal.   3. The mitral  valve has been repaired/replaced. No evidence of mitral  valve regurgitation. No evidence of mitral stenosis. The mean mitral valve  gradient is 3.0 mmHg with average heart rate of 77 bpm. Procedure Date:  05/21/2021.   4. The aortic valve is tricuspid. Aortic valve regurgitation is not  visualized. No aortic stenosis is present.   5. The inferior vena cava is normal in size with greater than 50%  respiratory variability, suggesting right atrial pressure of 3 mmHg.   Comparison(s): Prior images reviewed side by side. The left ventricular  function is worsened.  Echocardiogram 06/27/2022  IMPRESSIONS     1. Left ventricular ejection fraction, by estimation, is 45 to 50%. Left  ventricular ejection fraction by 3D volume is 46 %. The left ventricle has  mildly decreased function. The left ventricle demonstrates global  hypokinesis. Diastolic function  indeterminant due to MVR. The average left ventricular global longitudinal  strain is -19.2 %. The global longitudinal strain is normal.   2. Right ventricular systolic function is normal. The right ventricular  size is normal.   3. Left atrial size was mildly dilated.   4. The mitral valve  has been repaired/replaced. Trivial mitral valve  regurgitation. There is a 30 mm Medtronics Simuform ring annuloplasty  present in the mitral position. Procedure Date: 05/21/21. Echo findings are  consistent with normal structure and  function of the mitral valve prosthesis. Mean gradient 22mHg at HR 68bpm.   5. The aortic valve is tricuspid. There is mild calcification of the  aortic valve. There is mild thickening of the aortic valve. Aortic valve  regurgitation is not visualized. Aortic valve sclerosis/calcification is  present, without any evidence of  aortic stenosis.   6. The inferior vena cava is normal in size with greater than 50%  respiratory variability, suggesting right atrial pressure of 3 mmHg.   Comparison(s): 07/03/21 EF 40-45%. MV  3361mg mean PG, 61m78m peak PG.  Compared to prior, the EF on current study appears slightly better at  45-50%.    Cardiac catheterization 03/14/2021 Hemodynamic findings consistent with mitral valve regurgitation.   MarNatalija Mavis a [age]o. female      [id number]:  [facility] JonQuay Burow.D. [id number] Dominance: Right Intervention   EKG: None today.  Recent Labs: 04/28/2022: Hemoglobin 14.0; Platelets 209 07/01/2022: ALT 20; BUN 28; Creatinine, Ser 0.95; Potassium 4.0; Sodium 139; TSH 0.67  Recent Lipid Panel    Component Value Date/Time   CHOL 164 07/30/2021 0104   TRIG 112 07/30/2021 0104   TRIG 177 06/14/2010 0000   HDL 47 07/30/2021 0104   CHOLHDL 3.5 07/30/2021 0104   VLDL 22 07/30/2021 0104   LDLTaylor 07/30/2021 0104   LDLDIRECT 75.0 11/02/2019 1006    ASSESSMENT & PLAN    Atypical chest pain-denies chest pain today.  Contacted nurse triage line on 09/16/2022.  Reported intermittent episodes of chest discomfort that were brief and occasional.  Cardiac catheterization showed normal coronary anatomy 03/14/2021.  Recent chest discomfort appears to be related to precordial pain. Patient reassured that her discomfort is not related to cardiac issues. No plans for further ischemic evaluation at this time.  Essential hypertension-BP today 122/78.  Much better control at home.  Had been elevated at home prior to addition of amlodipine.   Continue metoprolol Continue amlodipine 5 mg a.m.  Heart healthy low-sodium diet-salty 6 reviewed.   Increase physical activity as tolerated Maintain blood pressure log  Moderate-severe mitral regurgitation-underwent mitral valve repair 05/21/2021 echocardiogram 06/27/2022 showed well-functioning mitral valve with slightly improved EF.  Details above.  Continues with no increased DOE, breathing much improved since surgery. Plan for repeat echocardiogram annually Heart healthy low-sodium  diet-salty 6 reviewed Continue to increase physical activity as tolerated   Disposition: Follow-up with Dr. BerGwenlyn Found me in 6-9 months        Medication Adjustments/Labs and Tests Ordered: Current medicines are reviewed at length with the patient today.  Concerns regarding medicines are outlined above.  Orders Placed This Encounter  Procedures   EKG 12-Lead   No orders of the defined types were placed in this encounter.   Patient Instructions  Medication Instructions:  The current medical regimen is effective;  continue present plan and medications as directed. Please refer to the Current Medication list given to you today.  *If you need a refill on your cardiac medications before your next appointment, please call your pharmacy*  Lab Work: NONE  Testing/Procedures: NONE  Follow-Up: [facility] needs are our [facility].  As part of our continuing mission to provide you with exceptional heart care,  we have created designated Provider Care Teams.  These Care Teams include your primary Cardiologist (physician) and Advanced Practice Providers (APPs -  Physician Assistants and Nurse Practitioners) who all work together to provide you with the care you need, when you need it.  Your next appointment:   6-9 month(s)  The format for your next appointment:   In Person  Provider:   Quay Burow, MD     Other Instructions PLEASE [place]  SALTY 6  PLEASE CONTINUE PHYSICAL ACTIVITY AS TOLERATED  Important Information About [place]             Signed, Deberah Pelton, NP  09/18/2022 8:30 AM      Notice: This dictation was prepared with Dragon dictation along with smaller phrase technology. Any transcriptional errors that result from this process are unintentional and may not be corrected upon review.  I spent 14 minutes examining this patient, reviewing medications, and using patient centered shared decision making  involving her cardiac care.  Prior to her visit I spent greater than 20 minutes reviewing her past medical history,  medications, and prior cardiac tests.

## 2022-09-17 ENCOUNTER — Encounter: Payer: Self-pay | Admitting: Nurse Practitioner

## 2022-09-17 ENCOUNTER — Other Ambulatory Visit: Payer: Self-pay | Admitting: Neurology

## 2022-09-17 ENCOUNTER — Ambulatory Visit: Payer: PPO | Admitting: Nurse Practitioner

## 2022-09-17 VITALS — BP 138/82 | HR 72 | Ht 63.0 in | Wt 134.2 lb

## 2022-09-17 DIAGNOSIS — G4486 Cervicogenic headache: Secondary | ICD-10-CM

## 2022-09-17 DIAGNOSIS — R195 Other fecal abnormalities: Secondary | ICD-10-CM

## 2022-09-17 MED ORDER — DICYCLOMINE HCL 10 MG PO CAPS: 10.0000 mg | ORAL_CAPSULE | Freq: Two times a day (BID) | ORAL | 3 refills | Status: DC

## 2022-09-17 NOTE — Progress Notes (Signed)
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 Was doing well until she stopped IBgard a few months ago. Now with recurrent loose stool, LLQ discomfort.  Resume IBgard. Resume Bentyl.  Will give 10 mg to take BID as needed Given another copy of low fodmap diet for her to look over.  She will follow up with Dr. Havery Moros in January, sooner if not improving.  I don't think stool studies are necessary right now but will pursue if she doesn't improve with above measures.  # Minor painless rectal bleeding which started with the loose stools Suspect perianal irritation vrs minor hemorrhoidal bleeding.  Monitor for now. No polyps / lesions on colonoscopy Sept 2021.    HPI   [name] [name] is a 76 year old female from [place], [place] known to Dr. Havery Moros.  She has a PMH of GERD, history of Schatzki ring status post dilation remotely, IBS, hypertension, migraines, paroxysmal atrial fibrillation on warfarin, CVA.  See PMH for additional medical history  10/30/21 last office visit. Follow-up on abdominal pain and loose stool. 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  Plan was to continue Bentyl and IBgard.  She and Dr. Havery Moros reviewed blood FODMAP diet   Interval History:  She stopped the IBguard a few months ago ( "taking too many meds") and now having recurrent loose for about two months. She has LLQ discomfort relieved with defecation. She also isn't taking bentyl ( forgot about it).  The first BM of the day is generally solid and then stools get looser as day progresses. She averages 4-5 BMs / day. Bowel movements are not meal triggered. No antibiotics use in the last few months. She hasn't had any medication or dietary changes over the last few months. She doesn't use artifical sweeteners. She doesn't really recall  much about the low FODMAP diet but sounds like she didn't go through a food elimination process. Her weight is stable. She has been seeing a small amount of blood on tissue since diarrhea started back.    Sept 2021 colonoscopy - The terminal ileum appeared normal. - A localized area of mucosa was found in the distal rectum / dentate line consistent with focal prolapse change. Biopsies were taken with a cold forceps for histology to ensure no adenomatous changes. - Internal hemorrhoids were found during retroflexion. - The exam was otherwise without abnormality. 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   Labs:     Latest Ref Rng & Units 04/28/2022   10:27 AM 03/25/2022   10:20 AM 07/29/2021    1:30 PM  CBC  WBC 4.0 - 10.5 K/uL 7.2  6.8  4.9   Hemoglobin 12.0 - 15.0 g/dL 14.0  13.6  13.1   Hematocrit 36.0 - 46.0 % 41.2  40.5  41.1   Platelets 150 - 400 K/uL 209  232  243        Latest Ref Rng & Units 07/01/2022   10:49 AM 07/29/2021    1:30 PM 05/17/2021    2:03 PM  Hepatic Function  Total Protein 6.0 - 8.3 g/dL 7.3  6.9  6.8   Albumin 3.5 - 5.2 g/dL 4.4  4.0  4.1  AST 0 - 37 U/L 26  20  21    ALT 0 - 35 U/L 20  14  25    Alk Phosphatase 39 - 117 U/L 39  48  40   Total Bilirubin 0.2 - 1.2 mg/dL 0.6  0.4  0.7      Past Medical History:  Diagnosis Date   Arthritis    Atypical chest pain    Diverticulosis    Gastritis 11/08/2007, 04/12/2012   H pylori bx neg 03/2012   GERD (gastroesophageal reflux disease) 11/08/2007, 04/12/2012   Hepatic cyst    Hiatal hernia 11/08/2007, 04/12/2012   History of kidney stones    Hyperlipidemia    Hypertension    IBS (irritable bowel syndrome)    diarrhea predom   Migraine    Mitral regurgitation    Osteoporosis    DEXA 01/21/2012: -2.1 spine and R fem, -1.8 L fem s/p fosamax  2004-2011 DEXA 04/18/14 @ [facility]: -2.5 - rec to  start Prolia     PVC's (premature ventricular contractions)    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   Complex valvuloplasty including PTFE neochord placement x6 with 30 mm Medtronic Simuform ring annuloplasty with clipping of LA appendage   Schatzki's ring 11/11/2010   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   Shingles outbreak 04/2013    Past Surgical History:  Procedure Laterality Date   ANTERIOR CERVICAL DECOMP/DISCECTOMY FUSION N/A 05/06/2022   Procedure: CERVICAL THREE-FOUR, CERVICAL FOUR-FIVE ANTERIOR CERVICAL DECOMPRESSION/DISCECTOMY FUSION;  Surgeon: Earnie Larsson, MD;  Location: [place];  Service: Neurosurgery;  Laterality: N/A;   [place] STUDY  03/14/2021   Procedure: BUBBLE STUDY;  Surgeon: Sueanne Margarita, MD;  Location: [place];  Service: Cardiovascular;;   CARDIAC CATHETERIZATION     CLIPPING OF ATRIAL APPENDAGE N/A 05/21/2021   Procedure: CLIPPING OF ATRIAL APPENDAGE USING ATRICURE 40MM PRO2 CLIP;  Surgeon: Rexene Alberts, MD;  Location: [facility];  Service: Open Heart Surgery;  Laterality: N/A;   COLONOSCOPY     CYSTOCELE REPAIR  2008   ESOPHAGOGASTRODUODENOSCOPY     Maxilofacial  1992   MITRAL VALVE REPAIR  05/21/2021   Procedure: MINIMALLY INVASIVE MITRAL VALVE REPAIR (MVR) USING MEDTRONIC SIMUFORM 30MM RING;  Surgeon: Rexene Alberts, MD;  Location: [facility];  Service: Open Heart Surgery;;   RIGHT/LEFT HEART CATH AND CORONARY ANGIOGRAPHY N/A 03/14/2021   Procedure: RIGHT/LEFT HEART CATH AND CORONARY ANGIOGRAPHY;  Surgeon: Lorretta Harp, MD;  Location: [place] [facility];  Service: Cardiovascular;  Laterality: N/A;   TEE WITHOUT CARDIOVERSION N/A 03/14/2021   Procedure: TRANSESOPHAGEAL ECHOCARDIOGRAM (TEE);  Surgeon: Sueanne Margarita, MD;  Location: [facility] ENDOSCOPY;  Service: Cardiovascular;  Laterality: N/A;   TEE WITHOUT CARDIOVERSION N/A 05/21/2021   Procedure: TRANSESOPHAGEAL ECHOCARDIOGRAM (TEE);  Surgeon: Rexene Alberts, MD;   Location: [place];  Service: Open Heart Surgery;  Laterality: N/A;   TONSILLECTOMY  1960    Current Medications, Allergies, Family History and Social History were reviewed in [employer] record.     Current Outpatient Medications  Medication Sig Dispense Refill   acetaminophen (TYLENOL) 650 MG CR tablet Take 650 mg by mouth every 8 (eight) hours as needed for pain (Headache).     amLODipine (NORVASC) 2.5 MG tablet Take 1 tablet (2.5 mg total) by mouth daily. Take 1 tablet in the evening (Patient taking differently: Take 2.5 mg by mouth every evening.) 90 tablet 3   amLODipine (NORVASC) 5 MG tablet Take 1  tablet (5 mg total) by mouth daily. (Patient taking differently: Take 5 mg by mouth every morning.) 180 tablet 3   aspirin EC 81 MG EC tablet Take 1 tablet (81 mg total) by mouth daily. Swallow whole. 30 tablet 11   atorvastatin (LIPITOR) 40 MG tablet Take 1 tablet (40 mg total) by mouth daily. (Patient taking differently: Take 40 mg by mouth at bedtime.) 90 tablet 3   carbamide peroxide (DEBROX) 6.5 % OTIC solution Place 5 drops into the left ear 2 (two) times daily. 15 mL 0   Cyanocobalamin (VITAMIN B-12) 2500 MCG SUBL Place 2,500 mcg under the tongue daily.     gabapentin (NEURONTIN) 100 MG capsule TAKE ONE CAPSULE BY MOUTH EVERY MORNING and TAKE TWO CAPSULES BY MOUTH EVERYDAY AT BEDTIME 90 capsule 0   metoprolol succinate (TOPROL-XL) 25 MG 24 hr tablet TAKE ONE TABLET BY MOUTH EVERY MORNING and TAKE ONE TABLET BY MOUTH EVERY EVENING 180 tablet 2   Peppermint Oil (IBGARD) 90 MG CPCR Use as needed (Patient taking differently: Take 90 mg by mouth daily as needed (Stomach control). Use as needed)     Polyethyl Glycol-Propyl Glycol (SYSTANE) 0.4-0.3 % SOLN Place 1 drop into both eyes 2 (two) times daily.     warfarin (COUMADIN) 2 MG tablet TAKE 1 AND 1/2 TABLETS BY MOUTH DAILY EXCEPT ON WEDNESDAY TAKE ONE TABLET OR AS DIRECTED by coumadin clinic 45 tablet 1   No current  facility-administered medications for this visit.    Review of Systems: No chest pain. No shortness of breath. No urinary complaints.    Physical Exam  Wt Readings from Last 3 Encounters:  08/26/22 132 lb 6 oz (60 kg)  07/07/22 133 lb 12.8 oz (60.7 kg)  07/01/22 133 lb (60.3 kg)    BP 138/82   Pulse 72   Ht 5' 3"  (1.6 m)   Wt 134 lb 4 oz (60.9 kg)   BMI 23.78 kg/m  Constitutional:  Generally well appearing female in no acute distress.Here with husband Psychiatric: Pleasant. Normal mood and affect. Behavior is normal. EENT: Pupils normal.  Conjunctivae are normal. No scleral icterus. Neck supple.  Cardiovascular: Normal rate, regular rhythm. No edema Pulmonary/chest: Effort normal and breath sounds normal. No wheezing, rales or rhonchi. Abdominal: Soft, nondistended, nontender. Bowel sounds active throughout. There are no masses palpable. No hepatomegaly. Neurological: Alert and oriented to person place and time. Skin: Skin is warm and dry. No rashes noted.  I spent 30 minutes total reviewing records, obtaining history, performing exam, counseling patient and documenting visit / findings.   Tye Savoy, NP  09/17/2022, 8:38 AM

## 2022-09-17 NOTE — Patient Instructions (Addendum)
_______________________________________________________  If you are age 77 or older, your body mass index should be between 23-30. Your Body mass index is 23.78 kg/m. If this is out of the aforementioned range listed, please consider follow up with your Primary Care Provider.  If you are age 70 or younger, your body mass index should be between 19-25. Your Body mass index is 23.78 kg/m. If this is out of the aformentioned range listed, please consider follow up with your Primary Care Provider.   ________________________________________________________  The [facility] providers would like to encourage you to use [facility] to communicate with providers for non-urgent requests or questions.  Due to long hold times on the telephone, sending your provider a message by [facility] may be a faster and more efficient way to get a response.  Please allow 48 business hours for a response.  Please remember that this is for non-urgent requests.  _______________________________________________________  We have sent the following medications to your pharmacy for you to pick up at your convenience: Bentyl  Restart daily  IBguard For abdominal pain you can try Dicyclomine 10       mg twice daily.  If not improving then call to let us know. Otherwise please call us in November to get a follow up with Dr. Havery Moros.     Low FODMAP Diet: (Fermentable Oligosaccharides, Disaccharides, Monosaccharides, and Polyols) These are short chain carbohydrates and sugar alcohols that are poorly absorbed by the body, resulting in multiple abdominal symptoms, including changes in bowel habits, abdominal pain/discomfort, bloating, abdominal distension, gas, etc.      It was a pleasure to see you today!  Thank you for trusting me with your gastrointestinal care!

## 2022-09-18 ENCOUNTER — Encounter: Payer: Self-pay | Admitting: General Practice

## 2022-09-18 ENCOUNTER — Ambulatory Visit: Payer: PPO | Attending: General Practice | Admitting: General Practice

## 2022-09-18 VITALS — BP 122/78 | HR 70 | Ht 63.0 in | Wt 133.0 lb

## 2022-09-18 DIAGNOSIS — R0789 Other chest pain: Secondary | ICD-10-CM

## 2022-09-18 DIAGNOSIS — I10 Essential (primary) hypertension: Secondary | ICD-10-CM

## 2022-09-18 DIAGNOSIS — I340 Nonrheumatic mitral (valve) insufficiency: Secondary | ICD-10-CM | POA: Diagnosis not present

## 2022-09-18 NOTE — Progress Notes (Signed)
Agree with assessment and plan as outlined.  

## 2022-09-18 NOTE — Patient Instructions (Signed)
Medication Instructions:  The current medical regimen is effective;  continue present plan and medications as directed. Please refer to the Current Medication list given to you today.  *If you need a refill on your cardiac medications before your next appointment, please call your pharmacy*  Lab Work: NONE  Testing/Procedures: NONE  Follow-Up: At [facility], you and your health needs are our priority.  As part of our continuing mission to provide you with exceptional heart care, we have created designated Provider Care Teams.  These Care Teams include your primary Cardiologist (physician) and Advanced Practice Providers (APPs -  Physician Assistants and Nurse Practitioners) who all work together to provide you with the care you need, when you need it.  Your next appointment:   6-9 month(s)  The format for your next appointment:   In Person  Provider:   Quay Burow, MD     Other Instructions PLEASE READ AND FOLLOW ATTACHED  SALTY 6  PLEASE CONTINUE PHYSICAL ACTIVITY AS TOLERATED  Important Information About Sugar

## 2022-09-21 NOTE — Telephone Encounter (Signed)
Pt archived in [email or web address].  Please advise if patient and/or provider wish to proceed with Prolia therpay.     Isaac Laud to [facility] (supporting Binnie Rail, MD)       12/11/21  8:53 AM Good morning, Dr. Quay Burow, I hope this new year has been good to you. After giving it a lot of thought, I decided to look for a doctor closer to my home and I can communicate in [place]. Last Monday I went to a first visit with Dr. B. Martinique, from [place].   Dr. Quay Burow, I am very grateful to you for all these years of taking good care of my health and your prompt addressing of some issues when necessary. Please again accept my sincere thank you, [name] [name]

## 2022-09-23 ENCOUNTER — Ambulatory Visit: Payer: PPO | Admitting: Family Medicine

## 2022-09-24 NOTE — Progress Notes (Signed)
[facility] [facility] Phone: [phone] Subjective:   [name] [name], am serving as a scribe for Dr. Hulan Saas. I'm seeing this patient by the request  of:  Martinique, Betty G, MD  CC: Finger pain follow-up  ELF:[id number]  07/22/2022 Chronic low back pain with exacerbation and worsening symptoms.  Starting to affect daily activities.  He does have some progression of the spondylolisthesis noted on x-ray at L4-L5.  At this point with patient having radicular symptoms consistent with spinal stenosis I do feel an MRI of the lumbar spine would be beneficial.  Depending on findings could be a candidate for possible injections but would have to have her hold her Coumadin and will need to ask primary care as well this cardiology.  Follow-up with me again after imaging to discuss further.  Patient given injection and tolerated the procedure well.  Discussed with patient about icing regimen and home exercises.  Discussed with patient worsening pain advanced imaging would be warranted.  Hopeful that patient will continue to make slow improvement.  Follow-up with me again in 6 to 8 weeks  Update 09/24/2022 [name] [name] is a 76 y.o. female coming in with complaint of back and R shoulder pain. Patient states. The back pain is better unless she turns quick, and right shoulder is all better.  Patients left hand at night will get numb in the pinky, middle, and ring finger. Patient states the ring finger will get stuck and sometimes the middle finger will as well. Patient states her pinky is doing better than the last time.      Past Medical History:  Diagnosis Date   Arthritis    Atypical chest pain    Diverticulosis    Gastritis 11/08/2007, 04/12/2012   H pylori bx neg 03/2012   GERD (gastroesophageal reflux disease) 11/08/2007, 04/12/2012   Hepatic cyst    Hiatal hernia 11/08/2007, 04/12/2012   History of kidney stones     Hyperlipidemia    Hypertension    IBS (irritable bowel syndrome)    diarrhea predom   Migraine    Mitral regurgitation    Osteoporosis    DEXA 01/21/2012: -2.1 spine and R fem, -1.8 L fem s/p fosamax  2004-2011 DEXA 04/18/14 @ [facility]: -2.5 - rec to start Prolia     PVC's (premature ventricular contractions)    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   Complex valvuloplasty including PTFE neochord placement x6 with 30 mm Medtronic Simuform ring annuloplasty with clipping of LA appendage   Schatzki's ring 11/11/2010   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   Shingles outbreak 04/2013   Past Surgical History:  Procedure Laterality Date   ANTERIOR CERVICAL DECOMP/DISCECTOMY FUSION N/A 05/06/2022   Procedure: CERVICAL THREE-FOUR, CERVICAL FOUR-FIVE ANTERIOR CERVICAL DECOMPRESSION/DISCECTOMY FUSION;  Surgeon: Earnie Larsson, MD;  Location: [place];  Service: Neurosurgery;  Laterality: N/A;   [place] STUDY  03/14/2021   Procedure: BUBBLE STUDY;  Surgeon: Sueanne Margarita, MD;  Location: [place];  Service: Cardiovascular;;   CARDIAC CATHETERIZATION     CLIPPING OF ATRIAL APPENDAGE N/A 05/21/2021   Procedure: CLIPPING OF ATRIAL APPENDAGE USING ATRICURE 40MM PRO2 CLIP;  Surgeon: Rexene Alberts, MD;  Location: [place];  Service: Open Heart Surgery;  Laterality: N/A;   COLONOSCOPY     CYSTOCELE REPAIR  2008   ESOPHAGOGASTRODUODENOSCOPY     Maxilofacial  1992   MITRAL VALVE REPAIR  05/21/2021  Procedure: MINIMALLY INVASIVE MITRAL VALVE REPAIR (MVR) USING MEDTRONIC SIMUFORM 30MM RING;  Surgeon: Rexene Alberts, MD;  Location: [facility] [place];  Service: Open Heart Surgery;;   RIGHT/LEFT HEART CATH AND CORONARY ANGIOGRAPHY N/A 03/14/2021   Procedure: RIGHT/LEFT HEART CATH AND CORONARY ANGIOGRAPHY;  Surgeon: Lorretta Harp, MD;  Location: [place] CV LAB;  Service: Cardiovascular;  Laterality: N/A;   TEE WITHOUT CARDIOVERSION N/A 03/14/2021   Procedure: TRANSESOPHAGEAL  ECHOCARDIOGRAM (TEE);  Surgeon: Sueanne Margarita, MD;  Location: [facility] ENDOSCOPY;  Service: Cardiovascular;  Laterality: N/A;   TEE WITHOUT CARDIOVERSION N/A 05/21/2021   Procedure: TRANSESOPHAGEAL ECHOCARDIOGRAM (TEE);  Surgeon: Rexene Alberts, MD;  Location: [place];  Service: Open Heart Surgery;  Laterality: N/A;   TONSILLECTOMY  1960   Social History   Socioeconomic History   Marital status: Married    Spouse name: [name]   Number of children: 3   Years of education: Not on file   Highest education level: Master's degree (e.g., MA, MS, MEng, MEd, MSW, MBA)  Occupational History   Occupation: retired  Tobacco Use   Smoking status: Never   Smokeless tobacco: Never  Vaping Use   Vaping Use: Never used  Substance and Sexual Activity   Alcohol use: [name]   Drug use: [name]   Sexual activity: Not on file  Other Topics Concern   Not on file  Social History Narrative   Married, lives with spouse. Retired Licensed conveyancer, Print production planner. Has 3 kids (moved to [place] to be closer)- youngest son MD. Dorie Rank to [place] from [place] 06/2010, lived in [place] x 10years   Social Determinants of Health   Financial Resource Strain: [place]  (02/06/2022)   Overall Financial Resource Strain (CARDIA)    Difficulty of Paying Living Expenses: Not hard at all  Food Insecurity: [name] Food Insecurity (02/06/2022)   Hunger Vital Sign    Worried About Running Out of Food in the Last Year: Never true    [place] in the Last Year: Never true  Transportation Needs: [name] Transportation Needs (02/06/2022)   PRAPARE - Hydrologist (Medical): [name]    Lack of Transportation (Non-Medical): [name]  Physical Activity: Inactive (02/06/2022)   Exercise Vital Sign    Days of Exercise per Week: 0 days    Minutes of Exercise per Session: 0 min  Stress: [name] Stress Concern Present (02/06/2022)   [place]    Feeling of Stress : Not at all  Social  Connections: [place] (02/06/2022)   Social Connection and Isolation Panel [NHANES]    Frequency of Communication with Friends and Family: More than three times a week    Frequency of Social Gatherings with Friends and Family: More than three times a week    Attends Religious Services: More than 4 times per year    Active Member of [employer] or Organizations: Yes    Attends Music therapist: More than 4 times per year    Marital Status: Married   [name] Known Allergies Family History  Problem Relation Age of Onset   Hypertension Mother    Alzheimer's disease Mother 22   AAA (abdominal aortic aneurysm) Mother    Stroke Father 19   Kidney cancer Brother        mets   Bone cancer Brother 19   Colon cancer Neg Hx      Current Outpatient Medications (Cardiovascular):    atorvastatin (LIPITOR)  40 MG tablet, Take 1 tablet (40 mg total) by mouth daily. (Patient taking differently: Take 40 mg by mouth at bedtime.)   metoprolol succinate (TOPROL-XL) 25 MG 24 hr tablet, TAKE ONE TABLET BY MOUTH EVERY MORNING and TAKE ONE TABLET BY MOUTH EVERY EVENING (Patient taking differently: TAKE ONE TABLET BY MOUTH EVERY MORNING and TAKE ONE TABLET BY MOUTH EVERY NIGHT)   amLODipine (NORVASC) 5 MG tablet, Take 1 tablet (5 mg total) by mouth daily. (Patient taking differently: Take 5 mg by mouth every morning.)   Current Outpatient Medications (Analgesics):    acetaminophen (TYLENOL) 650 MG CR tablet, Take 650 mg by mouth every 8 (eight) hours as needed for pain (Headache).   aspirin EC 81 MG EC tablet, Take 1 tablet (81 mg total) by mouth daily. Swallow whole.  Current Outpatient Medications (Hematological):    Cyanocobalamin (VITAMIN B-12) 2500 MCG SUBL, Place 2,500 mcg under the tongue daily.   warfarin (COUMADIN) 2 MG tablet, TAKE 1 AND 1/2 TABLETS BY MOUTH DAILY EXCEPT ON WEDNESDAY TAKE ONE TABLET OR AS DIRECTED by coumadin clinic  Current Outpatient Medications (Other):     carbamide peroxide (DEBROX) 6.5 % OTIC solution, Place 5 drops into the left ear 2 (two) times daily.   dicyclomine (BENTYL) 10 MG capsule, Take 1 capsule (10 mg total) by mouth 2 (two) times daily.   gabapentin (NEURONTIN) 100 MG capsule, TAKE ONE CAPSULE BY MOUTH EVERY MORNING and TAKE TWO CAPSULES BY MOUTH EVERYDAY AT BEDTIME (Patient taking differently: Take 100 mg by mouth at bedtime.)   Peppermint Oil (IBGARD) 90 MG CPCR, Use as needed (Patient taking differently: Take 90 mg by mouth daily as needed (Stomach control). Use as needed)   Polyethyl Glycol-Propyl Glycol (SYSTANE) 0.4-0.3 % SOLN, Place 1 drop into both eyes 2 (two) times daily.   Reviewed prior external information including notes and imaging from  primary care provider As well as notes that were available from care everywhere and other healthcare systems.  Past medical history, social, surgical and family history all reviewed in electronic medical record.  [name] pertanent information unless stated regarding to the chief complaint.   Review of Systems:  [name] headache, visual changes, nausea, vomiting, diarrhea, constipation, dizziness, abdominal pain, skin rash, fevers, chills, night sweats, weight loss, swollen lymph nodes, body aches, joint swelling, chest pain, shortness of breath, mood changes. POSITIVE muscle aches  Objective  Blood pressure 120/80, pulse 69, height '5\' 3"'$  (1.6 m), weight 134 lb (60.8 kg), SpO2 95 %.   General: [name] apparent distress alert and oriented x3 mood and affect normal, dressed appropriately.  HEENT: Pupils equal, extraocular movements intact  Respiratory: Patient's speak in full sentences and does not appear short of breath  Cardiovascular: [name] lower extremity edema, non tender, [name] erythema  Mild antalgic gait noted.  Regarding the right hand patient does have a trigger nodule noted at the A2 pulley of the ring finger on the left hand.  Tender to palpation in the area.  Procedure: Real-time Ultrasound  Guided Injection of left ring finger tendon sheath Device: GE Logiq Q7 Ultrasound guided injection is preferred based studies that show increased duration, increased effect, greater accuracy, decreased procedural pain, increased response rate, and decreased cost with ultrasound guided versus blind injection.  Verbal informed consent obtained.  Time-out conducted.  Noted [name] overlying erythema, induration, or other signs of local infection.  Skin prepped in a sterile fashion.  Local anesthesia: Topical Ethyl chloride.  With sterile technique and under real  time ultrasound guidance: With a 25-gauge half inch needle injecting 0.5 cc of 0.5% Marcaine and 0.5 cc of Kenalog 40 mg/mL. Completed without difficulty  Pain immediately resolved suggesting accurate placement of the medication.  Advised to call if fevers/chills, erythema, induration, drainage, or persistent bleeding.  Impression: Technically successful ultrasound guided injection.    Impression and Recommendations:     The above documentation has been reviewed and is accurate and complete Lyndal Pulley, DO

## 2022-09-29 ENCOUNTER — Ambulatory Visit: Payer: PPO | Admitting: Family Medicine

## 2022-09-29 ENCOUNTER — Ambulatory Visit: Payer: Self-pay

## 2022-09-29 ENCOUNTER — Encounter: Payer: Self-pay | Admitting: Family Medicine

## 2022-09-29 VITALS — BP 120/80 | HR 69 | Ht 63.0 in | Wt 134.0 lb

## 2022-09-29 DIAGNOSIS — M65342 Trigger finger, left ring finger: Secondary | ICD-10-CM

## 2022-09-29 DIAGNOSIS — M25511 Pain in right shoulder: Secondary | ICD-10-CM

## 2022-09-29 NOTE — Assessment & Plan Note (Signed)
Chronic problem with exacerbation.  We discussed bracing at night, we will discuss have the prognosis of this is.  Secondary to social determinants of health and chronic comorbidities including the atrial fibrillation and stroke this is likely the thing that will be most beneficial.  Patient wants to avoid surgical intervention.  Discussed we can repeat every 3 to 4 months if needed.  Follow-up with me again in 6 to 8 weeks.

## 2022-09-29 NOTE — Patient Instructions (Signed)
Injected trigger finger today See me again 3-4 months Wear brace at night

## 2022-10-10 NOTE — Progress Notes (Deleted)
ACUTE VISIT No chief complaint on file.  HPI: [name] is a 76 y.o. female, who is here today complaining of *** HPI  Review of Systems See other pertinent positives and negatives in HPI.  Current Outpatient Medications on File Prior to Visit  Medication Sig Dispense Refill   acetaminophen (TYLENOL) 650 MG CR tablet Take 650 mg by mouth every 8 (eight) hours as needed for pain (Headache).     amLODipine (NORVASC) 5 MG tablet Take 1 tablet (5 mg total) by mouth daily. (Patient taking differently: Take 5 mg by mouth every morning.) 180 tablet 3   aspirin EC 81 MG EC tablet Take 1 tablet (81 mg total) by mouth daily. Swallow whole. 30 tablet 11   atorvastatin (LIPITOR) 40 MG tablet Take 1 tablet (40 mg total) by mouth daily. (Patient taking differently: Take 40 mg by mouth at bedtime.) 90 tablet 3   carbamide peroxide (DEBROX) 6.5 % OTIC solution Place 5 drops into the left ear 2 (two) times daily. 15 mL 0   Cyanocobalamin (VITAMIN B-12) 2500 MCG SUBL Place 2,500 mcg under the tongue daily.     dicyclomine (BENTYL) 10 MG capsule Take 1 capsule (10 mg total) by mouth 2 (two) times daily. 60 capsule 3   gabapentin (NEURONTIN) 100 MG capsule TAKE ONE CAPSULE BY MOUTH EVERY MORNING and TAKE TWO CAPSULES BY MOUTH EVERYDAY AT BEDTIME (Patient taking differently: Take 100 mg by mouth at bedtime.) 90 capsule 0   metoprolol succinate (TOPROL-XL) 25 MG 24 hr tablet TAKE ONE TABLET BY MOUTH EVERY MORNING and TAKE ONE TABLET BY MOUTH EVERY EVENING (Patient taking differently: TAKE ONE TABLET BY MOUTH EVERY MORNING and TAKE ONE TABLET BY MOUTH EVERY NIGHT) 180 tablet 2   Peppermint Oil (IBGARD) 90 MG CPCR Use as needed (Patient taking differently: Take 90 mg by mouth daily as needed (Stomach control). Use as needed)     Polyethyl Glycol-Propyl Glycol (SYSTANE) 0.4-0.3 % SOLN Place 1 drop into both eyes 2 (two) times daily.     warfarin (COUMADIN) 2 MG tablet TAKE 1 AND 1/2 TABLETS BY MOUTH  DAILY EXCEPT ON WEDNESDAY TAKE ONE TABLET OR AS DIRECTED by coumadin clinic 45 tablet 1   No current facility-administered medications on file prior to visit.    Past Medical History:  Diagnosis Date   Arthritis    Atypical chest pain    Diverticulosis    Gastritis 11/08/2007, 04/12/2012   H pylori bx neg 03/2012   GERD (gastroesophageal reflux disease) 11/08/2007, 04/12/2012   Hepatic cyst    Hiatal hernia 11/08/2007, 04/12/2012   History of kidney stones    Hyperlipidemia    Hypertension    IBS (irritable bowel syndrome)    diarrhea predom   Migraine    Mitral regurgitation    Osteoporosis    DEXA 01/21/2012: -2.1 spine and R fem, -1.8 L fem s/p fosamax  2004-2011 DEXA 04/18/14 @ [facility]: -2.5 - rec to start Prolia     PVC's (premature ventricular contractions)    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   Complex valvuloplasty including PTFE neochord placement x6 with 30 mm Medtronic Simuform ring annuloplasty with clipping of LA appendage   Schatzki's ring 11/11/2010   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   Shingles outbreak 04/2013   No Known Allergies  Social History   Socioeconomic History   Marital status: Married    Spouse name: [name] [name]   Number of children: 3   Years of education: Not  on file   Highest education level: Master's degree (e.g., MA, MS, MEng, MEd, MSW, MBA)  Occupational History   Occupation: retired  Tobacco Use   Smoking status: Never   Smokeless tobacco: Never  Vaping Use   Vaping Use: Never used  Substance and Sexual Activity   Alcohol use: No   Drug use: No   Sexual activity: Not on file  Other Topics Concern   Not on file  Social History Narrative   Married, lives with spouse. Retired Licensed conveyancer, Print production planner. Has 3 kids (moved to [place] to be closer)- youngest son MD. Dorie Rank to [place] from [place] 06/2010, lived in [place] x 10years   Social Determinants of Health   Financial Resource Strain: [place]  (02/06/2022)   Overall  Financial Resource Strain (CARDIA)    Difficulty of Paying Living Expenses: Not hard at all  Food Insecurity: No Food Insecurity (02/06/2022)   Hunger Vital Sign    Worried About Running Out of Food in the Last Year: Never true    [place] in the Last Year: Never true  Transportation Needs: No Transportation Needs (02/06/2022)   PRAPARE - Hydrologist (Medical): No    Lack of Transportation (Non-Medical): No  Physical Activity: Inactive (02/06/2022)   Exercise Vital Sign    Days of Exercise per Week: 0 days    Minutes of Exercise per Session: 0 min  Stress: No Stress Concern Present (02/06/2022)   [place]    Feeling of Stress : Not at all  Social Connections: [place] (02/06/2022)   Social Connection and Isolation Panel [NHANES]    Frequency of Communication with Friends and Family: More than three times a week    Frequency of Social Gatherings with Friends and Family: More than three times a week    Attends Religious Services: More than 4 times per year    Active Member of [employer] or Organizations: Yes    Attends Music therapist: More than 4 times per year    Marital Status: Married    There were no vitals filed for this visit. There is no height or weight on file to calculate BMI.  Physical Exam  ASSESSMENT AND PLAN: There are no diagnoses linked to this encounter.  No follow-ups on file.  [name] G. Martinique, MD  [facility]. [place] office.  Discharge Instructions   None

## 2022-10-12 ENCOUNTER — Other Ambulatory Visit (HOSPITAL_BASED_OUTPATIENT_CLINIC_OR_DEPARTMENT_OTHER): Payer: Self-pay | Admitting: General Practice

## 2022-10-12 DIAGNOSIS — E782 Mixed hyperlipidemia: Secondary | ICD-10-CM

## 2022-10-13 ENCOUNTER — Encounter: Payer: Self-pay | Admitting: Family Medicine

## 2022-10-13 ENCOUNTER — Ambulatory Visit: Payer: PPO | Admitting: Family Medicine

## 2022-10-13 ENCOUNTER — Ambulatory Visit (INDEPENDENT_AMBULATORY_CARE_PROVIDER_SITE_OTHER): Payer: PPO | Admitting: Family Medicine

## 2022-10-13 VITALS — BP 110/70 | HR 38 | Temp 98.3°F | Resp 16 | Ht 63.0 in | Wt 134.4 lb

## 2022-10-13 DIAGNOSIS — M542 Cervicalgia: Secondary | ICD-10-CM

## 2022-10-13 DIAGNOSIS — R519 Headache, unspecified: Secondary | ICD-10-CM

## 2022-10-13 MED ORDER — TRAMADOL HCL 50 MG PO TABS: 25.0000 mg | ORAL_TABLET | Freq: Two times a day (BID) | ORAL | 0 refills | Status: DC | PRN
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
## 2022-10-13 NOTE — Patient Instructions (Addendum)
A few things to remember from today's visit:  Cervicalgia - Plan: DG Cervical Spine Complete, traMADol (ULTRAM) 50 MG tablet  Temporal headache - Plan: C-reactive protein, Sedimentation rate, traMADol (ULTRAM) 50 MG tablet  Tramadol 1/2-1 tab 2 veces al dia cuando lo necesite para el dolor. Puede tomar el medicamento nuevo con Tylenol 500 mg.  If you need refills for medications you take chronically, please call your pharmacy. Do not use My Chart to request refills or for acute issues that need immediate attention. If you send a my chart message, it may take a few days to be addressed, specially if I am not in the office.  Please be sure medication list is accurate. If a new problem present, please set up appointment sooner than planned today.

## 2022-10-14 ENCOUNTER — Ambulatory Visit (INDEPENDENT_AMBULATORY_CARE_PROVIDER_SITE_OTHER): Payer: PPO

## 2022-10-14 ENCOUNTER — Other Ambulatory Visit: Payer: PPO

## 2022-10-14 DIAGNOSIS — M542 Cervicalgia: Secondary | ICD-10-CM

## 2022-10-14 LAB — SEDIMENTATION RATE: Sed Rate: 8 mm/hr (ref 0–30)

## 2022-10-14 LAB — C-REACTIVE PROTEIN: CRP: <1 mg/dL (ref 0.5–20.0)

## 2022-10-23 ENCOUNTER — Ambulatory Visit: Payer: PPO | Attending: Cardiovascular Disease | Admitting: *Deleted

## 2022-10-23 DIAGNOSIS — Z7901 Long term (current) use of anticoagulants: Secondary | ICD-10-CM | POA: Diagnosis not present

## 2022-10-23 DIAGNOSIS — Z9889 Other specified postprocedural states: Secondary | ICD-10-CM | POA: Diagnosis not present

## 2022-10-23 DIAGNOSIS — I340 Nonrheumatic mitral (valve) insufficiency: Secondary | ICD-10-CM

## 2022-10-23 LAB — POCT INR: INR: 2.2 (ref 2.0–3.0)

## 2022-10-23 NOTE — Patient Instructions (Signed)
Description   Continue taking 1.5 tablets daily except for 1 tablet on Wednesday. Recheck INR in 6 weeks. Coumadin Clinic [phone];
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
## 2022-10-27 NOTE — Progress Notes (Deleted)
[name] Phone: [phone] Subjective:    I'm seeing this patient by the request  of:  [place], Betty G, MD  CC:   [id number]  09/29/2022 Chronic problem with exacerbation. We discussed bracing at night, we will discuss have the prognosis of this is. Secondary to social determinants of health and chronic comorbidities including the atrial fibrillation and stroke this is likely the thing that will be most beneficial. Patient wants to avoid surgical intervention. Discussed we can repeat every 3 to 4 months if needed. Follow-up with me again in 6 to 8 weeks.    Update 10/29/2022 [name] [name] is a 76 y.o. female coming in with complaint of R shoulder and L hand trigger finger. Patient states        Past Medical History:  Diagnosis Date   Arthritis    Atypical chest pain    Diverticulosis    Gastritis 11/08/2007, 04/12/2012   H pylori bx neg 03/2012   GERD (gastroesophageal reflux disease) 11/08/2007, 04/12/2012   Hepatic cyst    Hiatal hernia 11/08/2007, 04/12/2012   History of kidney stones    Hyperlipidemia    Hypertension    IBS (irritable bowel syndrome)    diarrhea predom   Migraine    Mitral regurgitation    Osteoporosis    DEXA 01/21/2012: -2.1 spine and R fem, -1.8 L fem s/p fosamax  2004-2011 DEXA 04/18/14 @ [facility]: -2.5 - rec to start Prolia     PVC's (premature ventricular contractions)    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   Complex valvuloplasty including PTFE neochord placement x6 with 30 mm Medtronic Simuform ring annuloplasty with clipping of LA appendage   Schatzki's ring 11/11/2010   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   Shingles outbreak 04/2013   Past Surgical History:  Procedure Laterality Date   ANTERIOR CERVICAL DECOMP/DISCECTOMY FUSION N/A 05/06/2022   Procedure: CERVICAL THREE-FOUR, CERVICAL FOUR-FIVE ANTERIOR CERVICAL DECOMPRESSION/DISCECTOMY FUSION;   Surgeon: Earnie Larsson, MD;  Location: [facility] [place];  Service: Neurosurgery;  Laterality: N/A;   [place] STUDY  03/14/2021   Procedure: BUBBLE STUDY;  Surgeon: Sueanne Margarita, MD;  Location: [facility];  Service: Cardiovascular;;   CARDIAC CATHETERIZATION     CLIPPING OF ATRIAL APPENDAGE N/A 05/21/2021   Procedure: CLIPPING OF ATRIAL APPENDAGE USING ATRICURE 40MM PRO2 CLIP;  Surgeon: Rexene Alberts, MD;  Location: [facility];  Service: Open Heart Surgery;  Laterality: N/A;   COLONOSCOPY     CYSTOCELE REPAIR  2008   ESOPHAGOGASTRODUODENOSCOPY     Maxilofacial  1992   MITRAL VALVE REPAIR  05/21/2021   Procedure: MINIMALLY INVASIVE MITRAL VALVE REPAIR (MVR) USING MEDTRONIC SIMUFORM 30MM RING;  Surgeon: Rexene Alberts, MD;  Location: [place];  Service: Open Heart Surgery;;   RIGHT/LEFT HEART CATH AND CORONARY ANGIOGRAPHY N/A 03/14/2021   Procedure: RIGHT/LEFT HEART CATH AND CORONARY ANGIOGRAPHY;  Surgeon: Lorretta Harp, MD;  Location: [facility];  Service: Cardiovascular;  Laterality: N/A;   TEE WITHOUT CARDIOVERSION N/A 03/14/2021   Procedure: TRANSESOPHAGEAL ECHOCARDIOGRAM (TEE);  Surgeon: Sueanne Margarita, MD;  Location: [facility];  Service: Cardiovascular;  Laterality: N/A;   TEE WITHOUT CARDIOVERSION N/A 05/21/2021   Procedure: TRANSESOPHAGEAL ECHOCARDIOGRAM (TEE);  Surgeon: Rexene Alberts, MD;  Location: [facility];  Service: Open Heart Surgery;  Laterality: N/A;   TONSILLECTOMY  1960   Social History   Socioeconomic History   Marital  status: Married    Spouse name: [name]   Number of children: 3   Years of education: Not on file   Highest education level: Master's degree (e.g., MA, MS, MEng, MEd, MSW, MBA)  Occupational History   Occupation: retired  Tobacco Use   Smoking status: Never   Smokeless tobacco: Never  Vaping Use   Vaping Use: Never used  Substance and Sexual Activity   Alcohol use: No   Drug use: No   Sexual activity: Not on file  Other  Topics Concern   Not on file  Social History Narrative   Married, lives with spouse. Retired Licensed conveyancer, Print production planner. Has 3 kids (moved to [place] to be closer)- youngest son MD. Dorie Rank to  from [place] 06/2010, lived in [place] x 10years   Social Determinants of Health   Financial Resource Strain: [place]  (02/06/2022)   Overall Financial Resource Strain (CARDIA)    Difficulty of Paying Living Expenses: Not hard at all  Food Insecurity: No Food Insecurity (02/06/2022)   Hunger Vital Sign    Worried About Running Out of Food in the Last Year: Never true    [place] in the Last Year: Never true  Transportation Needs: No Transportation Needs (02/06/2022)   PRAPARE - Hydrologist (Medical): No    Lack of Transportation (Non-Medical): No  Physical Activity: Inactive (02/06/2022)   Exercise Vital Sign    Days of Exercise per Week: 0 days    Minutes of Exercise per Session: 0 min  Stress: No Stress Concern Present (02/06/2022)   [place]    Feeling of Stress : Not at all  Social Connections: [place] (02/06/2022)   Social Connection and Isolation Panel [NHANES]    Frequency of Communication with Friends and Family: More than three times a week    Frequency of Social Gatherings with Friends and Family: More than three times a week    Attends Religious Services: More than 4 times per year    Active Member of [employer] or Organizations: Yes    Attends Music therapist: More than 4 times per year    Marital Status: Married   No Known Allergies Family History  Problem Relation Age of Onset   Hypertension Mother    Alzheimer's disease Mother 29   AAA (abdominal aortic aneurysm) Mother    Stroke Father 39   Kidney cancer Brother        mets   Bone cancer Brother 19   Colon cancer Neg Hx      Current Outpatient Medications (Cardiovascular):    amLODipine (NORVASC) 5  MG tablet, Take 1 tablet (5 mg total) by mouth daily. (Patient taking differently: Take 5 mg by mouth every morning.)   atorvastatin (LIPITOR) 40 MG tablet, TAKE ONE TABLET BY MOUTH ONCE DAILY   metoprolol succinate (TOPROL-XL) 25 MG 24 hr tablet, TAKE ONE TABLET BY MOUTH EVERY MORNING and TAKE ONE TABLET BY MOUTH EVERY EVENING (Patient taking differently: TAKE ONE TABLET BY MOUTH EVERY MORNING and TAKE ONE TABLET BY MOUTH EVERY NIGHT)   Current Outpatient Medications (Analgesics):    acetaminophen (TYLENOL) 650 MG CR tablet, Take 650 mg by mouth every 8 (eight) hours as needed for pain (Headache).   aspirin EC 81 MG EC tablet, Take 1 tablet (81 mg total) by mouth daily. Swallow whole.   traMADol (ULTRAM) 50 MG tablet, Take 0.5-1 tablets (25-50 mg total)  by mouth every 12 (twelve) hours as needed.  Current Outpatient Medications (Hematological):    Cyanocobalamin (VITAMIN B-12) 2500 MCG SUBL, Place 2,500 mcg under the tongue daily.   warfarin (COUMADIN) 2 MG tablet, TAKE 1 AND 1/2 TABLETS BY MOUTH DAILY EXCEPT ON WEDNESDAY TAKE ONE TABLET OR AS DIRECTED by coumadin clinic  Current Outpatient Medications (Other):    carbamide peroxide (DEBROX) 6.5 % OTIC solution, Place 5 drops into the left ear 2 (two) times daily.   dicyclomine (BENTYL) 10 MG capsule, Take 1 capsule (10 mg total) by mouth 2 (two) times daily.   gabapentin (NEURONTIN) 100 MG capsule, TAKE ONE CAPSULE BY MOUTH EVERY MORNING and TAKE TWO CAPSULES BY MOUTH EVERYDAY AT BEDTIME (Patient taking differently: Take 100 mg by mouth at bedtime.)   Peppermint Oil (IBGARD) 90 MG CPCR, Use as needed (Patient taking differently: Take 90 mg by mouth daily as needed (Stomach control). Use as needed)   Polyethyl Glycol-Propyl Glycol (SYSTANE) 0.4-0.3 % SOLN, Place 1 drop into both eyes 2 (two) times daily.   Reviewed prior external information including notes and imaging from  primary care provider As well as notes that were available from  care everywhere and other healthcare systems.  Past medical history, social, surgical and family history all reviewed in electronic medical record.  No pertanent information unless stated regarding to the chief complaint.   Review of Systems:  No headache, visual changes, nausea, vomiting, diarrhea, constipation, dizziness, abdominal pain, skin rash, fevers, chills, night sweats, weight loss, swollen lymph nodes, body aches, joint swelling, chest pain, shortness of breath, mood changes. POSITIVE muscle aches  Objective  There were no vitals taken for this visit.   General: No apparent distress alert and oriented x3 mood and affect normal, dressed appropriately.  HEENT: Pupils equal, extraocular movements intact  Respiratory: Patient's speak in full sentences and does not appear short of breath  Cardiovascular: No lower extremity edema, non tender, no erythema      Impression and Recommendations:

## 2022-10-28 ENCOUNTER — Encounter: Payer: Self-pay | Admitting: Family Medicine

## 2022-10-28 ENCOUNTER — Ambulatory Visit (INDEPENDENT_AMBULATORY_CARE_PROVIDER_SITE_OTHER): Payer: PPO | Admitting: Family Medicine

## 2022-10-28 VITALS — BP 104/70 | HR 81 | Resp 16 | Ht 63.0 in | Wt 133.2 lb

## 2022-10-28 DIAGNOSIS — L918 Other hypertrophic disorders of the skin: Secondary | ICD-10-CM

## 2022-10-28 DIAGNOSIS — R519 Headache, unspecified: Secondary | ICD-10-CM

## 2022-10-28 DIAGNOSIS — M542 Cervicalgia: Secondary | ICD-10-CM | POA: Diagnosis not present

## 2022-10-28 DIAGNOSIS — I10 Essential (primary) hypertension: Secondary | ICD-10-CM

## 2022-10-28 HISTORY — DX: Cervicalgia: M54.2

## 2022-10-28 HISTORY — DX: Other hypertrophic disorders of the skin: L91.8

## 2022-10-28 MED ORDER — CLONIDINE HCL 0.1 MG PO TABS: 0.1000 mg | ORAL_TABLET | Freq: Every day | ORAL | 1 refills | Status: DC | PRN
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
## 2022-10-28 NOTE — Assessment & Plan Note (Signed)
BP adequately controlled. Continue  Amlodipine 5 mg daily and Metoprolol succinate 25 mg daily. Clonidine 0.1 mg  daily as needed to continue. Low salt diet.

## 2022-10-28 NOTE — Assessment & Plan Note (Signed)
Prefers to hold on procedure until next year. She would like derma referral.

## 2022-10-28 NOTE — Patient Instructions (Addendum)
A few things to remember from today's visit:  Skin tag - Plan: Ambulatory referral to Dermatology  Cervicalgia  Headache, unspecified headache type  No cambios hoy. Hoy formamos un contrato para el tramadol porque son requerimientos. Llame 3-4 das antes de que se le acabe el medicamento para mandarlo a Engineer, building services.  Please be sure medication list is accurate. If a new problem present, please set up appointment sooner than planned today.

## 2022-10-29 ENCOUNTER — Ambulatory Visit: Payer: PPO | Admitting: Family Medicine

## 2022-10-30 DIAGNOSIS — S161XXA Strain of muscle, fascia and tendon at neck level, initial encounter: Secondary | ICD-10-CM | POA: Diagnosis not present

## 2022-10-30 NOTE — Assessment & Plan Note (Signed)
Tension like headache, chronic. Continue Gabapentin 100 mg daily. Tramadol  50 mg 1/2-1 tab bid as needed.

## 2022-11-06 ENCOUNTER — Other Ambulatory Visit: Payer: Self-pay | Admitting: Cardiovascular Disease

## 2022-11-06 DIAGNOSIS — I480 Paroxysmal atrial fibrillation: Secondary | ICD-10-CM

## 2022-11-06 NOTE — Telephone Encounter (Signed)
Prescription refill request received for warfarin Lov: 09/18/22 [name] [name])  Next INR check: 12/02/22 Warfarin tablet strength: '2mg'$   Appropriate dose and refill sent to requested pharmacy.

## 2022-11-12 ENCOUNTER — Other Ambulatory Visit (HOSPITAL_BASED_OUTPATIENT_CLINIC_OR_DEPARTMENT_OTHER): Payer: Self-pay | Admitting: General Practice

## 2022-12-09 DIAGNOSIS — M4310 Spondylolisthesis, site unspecified: Secondary | ICD-10-CM | POA: Diagnosis not present

## 2022-12-09 DIAGNOSIS — S134XXD Sprain of ligaments of cervical spine, subsequent encounter: Secondary | ICD-10-CM | POA: Diagnosis not present

## 2022-12-11 ENCOUNTER — Ambulatory Visit: Payer: Medicare HMO | Attending: Cardiovascular Disease

## 2022-12-11 DIAGNOSIS — I340 Nonrheumatic mitral (valve) insufficiency: Secondary | ICD-10-CM

## 2022-12-11 DIAGNOSIS — Z5181 Encounter for therapeutic drug level monitoring: Secondary | ICD-10-CM | POA: Diagnosis not present

## 2022-12-11 LAB — POCT INR: INR: 2.3 (ref 2.0–3.0)

## 2022-12-11 NOTE — Patient Instructions (Signed)
Description   Continue taking 1.5 tablets daily except for 1 tablet on Wednesday.  Recheck INR in 7 weeks. Coumadin Clinic [phone];

## 2022-12-12 ENCOUNTER — Encounter: Payer: Self-pay | Admitting: Family Medicine

## 2022-12-12 DIAGNOSIS — S134XXD Sprain of ligaments of cervical spine, subsequent encounter: Secondary | ICD-10-CM | POA: Diagnosis not present

## 2022-12-13 ENCOUNTER — Encounter: Payer: Self-pay | Admitting: Family Medicine

## 2022-12-16 ENCOUNTER — Encounter: Payer: Self-pay | Admitting: Family Medicine

## 2022-12-16 NOTE — Telephone Encounter (Signed)
error 

## 2022-12-18 DIAGNOSIS — S134XXD Sprain of ligaments of cervical spine, subsequent encounter: Secondary | ICD-10-CM | POA: Diagnosis not present

## 2022-12-19 NOTE — Progress Notes (Unsigned)
ACUTE VISIT No chief complaint on file.  HPI: [name] is a 77 y.o. female, who is here today complaining of *** HPI  Review of Systems See other pertinent positives and negatives in HPI.  Current Outpatient Medications on File Prior to Visit  Medication Sig Dispense Refill   warfarin (COUMADIN) 2 MG tablet TAKE 1 TABLET TO 1 1/2 TABLETS BY MOUTH DAILY AS DIRECTED BY [facility]MADIN CLINIC 45 tablet 2   acetaminophen (TYLENOL) 650 MG CR tablet Take 650 mg by mouth every 8 (eight) hours as needed for pain (Headache).     amLODipine (NORVASC) 5 MG tablet Take 1 tablet (5 mg total) by mouth daily. 180 tablet 0   aspirin EC 81 MG EC tablet Take 1 tablet (81 mg total) by mouth daily. Swallow whole. 30 tablet 11   atorvastatin (LIPITOR) 40 MG tablet TAKE ONE TABLET BY MOUTH ONCE DAILY 90 tablet 3   carbamide peroxide (DEBROX) 6.5 % OTIC solution Place 5 drops into the left ear 2 (two) times daily. 15 mL 0   cloNIDine (CATAPRES) 0.1 MG tablet Take 1 tablet (0.1 mg total) by mouth daily as needed. If blood pressure 160/90 or higher. 30 tablet 1   Cyanocobalamin (VITAMIN B-12) 2500 MCG SUBL Place 2,500 mcg under the tongue daily.     dicyclomine (BENTYL) 10 MG capsule Take 1 capsule (10 mg total) by mouth 2 (two) times daily. 60 capsule 3   gabapentin (NEURONTIN) 100 MG capsule TAKE ONE CAPSULE BY MOUTH EVERY MORNING and TAKE TWO CAPSULES BY MOUTH EVERYDAY AT BEDTIME (Patient taking differently: Take 100 mg by mouth at bedtime.) 90 capsule 0   metoprolol succinate (TOPROL-XL) 25 MG 24 hr tablet TAKE ONE TABLET BY MOUTH EVERY MORNING and TAKE ONE TABLET BY MOUTH EVERY EVENING (Patient taking differently: TAKE ONE TABLET BY MOUTH EVERY MORNING and TAKE ONE TABLET BY MOUTH EVERY NIGHT) 180 tablet 2   Peppermint Oil (IBGARD) 90 MG CPCR Use as needed (Patient taking differently: Take 90 mg by mouth daily as needed (Stomach control). Use as needed)     Polyethyl Glycol-Propyl Glycol (SYSTANE)  0.4-0.3 % SOLN Place 1 drop into both eyes 2 (two) times daily.     traMADol (ULTRAM) 50 MG tablet Take 0.5-1 tablets (25-50 mg total) by mouth every 12 (twelve) hours as needed. 30 tablet 0   No current facility-administered medications on file prior to visit.    Past Medical History:  Diagnosis Date   Arthritis    Atypical chest pain    Diverticulosis    Gastritis 11/08/2007, 04/12/2012   H pylori bx neg 03/2012   GERD (gastroesophageal reflux disease) 11/08/2007, 04/12/2012   Hepatic cyst    Hiatal hernia 11/08/2007, 04/12/2012   History of kidney stones    Hyperlipidemia    Hypertension    IBS (irritable bowel syndrome)    diarrhea predom   Migraine    Mitral regurgitation    Osteoporosis    DEXA 01/21/2012: -2.1 spine and R fem, -1.8 L fem s/p fosamax  2004-2011 DEXA 04/18/14 @ [facility]: -2.5 - rec to start Prolia     PVC's (premature ventricular contractions)    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   Complex valvuloplasty including PTFE neochord placement x6 with 30 mm Medtronic Simuform ring annuloplasty with clipping of LA appendage   Schatzki's ring 11/11/2010   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   Shingles outbreak 04/2013   No Known Allergies  Social History   Socioeconomic  History   Marital status: Married    Spouse name: [name] [name]   Number of children: 3   Years of education: Not on file   Highest education level: Master's degree (e.g., MA, MS, MEng, MEd, MSW, MBA)  Occupational History   Occupation: retired  Tobacco Use   Smoking status: Never   Smokeless tobacco: Never  Vaping Use   Vaping Use: Never used  Substance and Sexual Activity   Alcohol use: No   Drug use: No   Sexual activity: Not on file  Other Topics Concern   Not on file  Social History Narrative   Married, lives with spouse. Retired Licensed conveyancer, Print production planner. Has 3 kids (moved to [place] to be closer)- youngest son MD. Dorie Rank to [place] from [place] 06/2010, lived in [place] x 10years    Social Determinants of Health   Financial Resource Strain: [place]  (02/06/2022)   Overall Financial Resource Strain (CARDIA)    Difficulty of Paying Living Expenses: Not hard at all  Food Insecurity: No Food Insecurity (02/06/2022)   Hunger Vital Sign    Worried About Running Out of Food in the Last Year: Never true    [place] in the Last Year: Never true  Transportation Needs: No Transportation Needs (02/06/2022)   PRAPARE - Hydrologist (Medical): No    Lack of Transportation (Non-Medical): No  Physical Activity: Inactive (02/06/2022)   Exercise Vital Sign    Days of Exercise per Week: 0 days    Minutes of Exercise per Session: 0 min  Stress: No Stress Concern Present (02/06/2022)   [place]    Feeling of Stress : Not at all  Social Connections: [place] (02/06/2022)   Social Connection and Isolation Panel [NHANES]    Frequency of Communication with Friends and Family: More than three times a week    Frequency of Social Gatherings with Friends and Family: More than three times a week    Attends Religious Services: More than 4 times per year    Active Member of [employer] or Organizations: Yes    Attends Music therapist: More than 4 times per year    Marital Status: Married    There were no vitals filed for this visit. There is no height or weight on file to calculate BMI.  Physical Exam  ASSESSMENT AND PLAN: There are no diagnoses linked to this encounter.  No follow-ups on file.  [name] Faux G. [place], MD  [facility]. [place] office.  Discharge Instructions   None

## 2022-12-22 ENCOUNTER — Encounter: Payer: Self-pay | Admitting: Family Medicine

## 2022-12-22 ENCOUNTER — Ambulatory Visit (INDEPENDENT_AMBULATORY_CARE_PROVIDER_SITE_OTHER): Payer: Medicare HMO | Admitting: Family Medicine

## 2022-12-22 VITALS — BP 120/80 | HR 94 | Temp 98.0°F | Resp 16 | Ht 63.0 in | Wt 134.1 lb

## 2022-12-22 DIAGNOSIS — M25542 Pain in joints of left hand: Secondary | ICD-10-CM | POA: Diagnosis not present

## 2022-12-22 DIAGNOSIS — G4486 Cervicogenic headache: Secondary | ICD-10-CM | POA: Diagnosis not present

## 2022-12-22 DIAGNOSIS — I5022 Chronic systolic (congestive) heart failure: Secondary | ICD-10-CM | POA: Diagnosis not present

## 2022-12-22 DIAGNOSIS — M25541 Pain in joints of right hand: Secondary | ICD-10-CM

## 2022-12-22 DIAGNOSIS — I480 Paroxysmal atrial fibrillation: Secondary | ICD-10-CM | POA: Diagnosis not present

## 2022-12-22 MED ORDER — GABAPENTIN 100 MG PO CAPS: 100.0000 mg | ORAL_CAPSULE | Freq: Every day | ORAL | 3 refills | Status: DC

## 2022-12-22 NOTE — Patient Instructions (Addendum)
A few things to remember from today's visit:  Arthralgia of both hands - Plan: Ambulatory referral to Rheumatology  Chronic systolic heart failure ([place]), Chronic  Paroxysmal atrial fibrillation ([place]), Chronic  Cervicogenic headache - Plan: gabapentin (NEURONTIN) 100 MG capsule  Do not use My Chart to request refills or for acute issues that need immediate attention. If you send a my chart message, it may take a few days to be addressed, specially if I am not in the office.  Please be sure medication list is accurate. If a new problem present, please set up appointment sooner than planned today.

## 2022-12-23 DIAGNOSIS — S134XXD Sprain of ligaments of cervical spine, subsequent encounter: Secondary | ICD-10-CM | POA: Diagnosis not present

## 2022-12-23 NOTE — Assessment & Plan Note (Signed)
Euvolemic. On Metoprolol succinate. HFrEF, last echo 06/2022 LVEF 45-50% and global hypokinesia. Following with cardiologist.

## 2022-12-23 NOTE — Assessment & Plan Note (Addendum)
We discussed possible etiologies, most likely OA. She would like a consultation with rheumatologist. Tylenol 500 mg 3-4 ties per day as needed.

## 2022-12-23 NOTE — Assessment & Plan Note (Addendum)
Today rhythm controlled. Continue Coumadin and Metoprolol succinate same dose. Follows with cardiologist.

## 2022-12-23 NOTE — Assessment & Plan Note (Signed)
Continue Gabapentin 100 mg daily at bedtime. She is taking Tramadol 50 mg 1/2-1 tab daily as needed, which has helped with pain. Side effects of medications discussed.

## 2022-12-25 ENCOUNTER — Encounter: Payer: Self-pay | Admitting: Family Medicine

## 2022-12-26 ENCOUNTER — Other Ambulatory Visit: Payer: Self-pay | Admitting: Family Medicine

## 2022-12-26 DIAGNOSIS — K625 Hemorrhage of anus and rectum: Secondary | ICD-10-CM

## 2022-12-26 DIAGNOSIS — S134XXD Sprain of ligaments of cervical spine, subsequent encounter: Secondary | ICD-10-CM | POA: Diagnosis not present

## 2022-12-26 NOTE — Progress Notes (Signed)
gas

## 2022-12-30 ENCOUNTER — Other Ambulatory Visit: Payer: Self-pay | Admitting: Family Medicine

## 2022-12-30 DIAGNOSIS — I10 Essential (primary) hypertension: Secondary | ICD-10-CM

## 2023-01-01 DIAGNOSIS — S134XXD Sprain of ligaments of cervical spine, subsequent encounter: Secondary | ICD-10-CM | POA: Diagnosis not present

## 2023-01-01 NOTE — Progress Notes (Deleted)
Office Visit Note  Patient: [name] [name]             Date of Birth: 13-Oct-1946           MRN: [id number]             PCP: Martinique, Betty G, MD Referring: Martinique, Betty G, MD Visit Date: 01/15/2023 Occupation: @GUAROCC$ @  Subjective:  Pain in multiple joints   History of Present Illness: [name] [name] is a 77 y.o. female who presents today for a new patient consultation as requested by her PCP.      Activities of Daily Living:  Patient reports morning stiffness for *** {minute/hour:19697}.   Patient {ACTIONS;DENIES/REPORTS:21021675::"Denies"} nocturnal pain.  Difficulty dressing/grooming: {ACTIONS;DENIES/REPORTS:21021675::"Denies"} Difficulty climbing stairs: {ACTIONS;DENIES/REPORTS:21021675::"Denies"} Difficulty getting out of chair: {ACTIONS;DENIES/REPORTS:21021675::"Denies"} Difficulty using hands for taps, buttons, cutlery, and/or writing: {ACTIONS;DENIES/REPORTS:21021675::"Denies"}  No Rheumatology ROS completed.   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   Mitral regurgitation 02/28/2021   Heart failure ([place]) 02/21/2021   Murmur 02/21/2021   Abnormal CT scan of lung 02/06/2021   Chest pain 02/05/2021   Cardiomegaly 02/05/2021   Left lower quadrant abdominal pain 05/01/2020   Acute post-traumatic headache, not intractable 11/15/2019   Prediabetes 05/02/2019   Carbuncle of groin 02/25/2019   Cervicogenic headache 06/03/2018   Skin abnormalities 05/07/2018   Bilateral shoulder pain  12/11/2017   Gastritis 12/11/2017   Trigger finger, right little finger 09/11/2017   Numbness and tingling in left hand 07/20/2017   Dupuytren's contracture of hand 07/20/2017   CTS (carpal tunnel syndrome) 07/17/2017   Bilateral carpal tunnel syndrome 07/17/2017   Arthralgia 06/30/2017   Leg cramps 06/10/2017   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   Chronic midline low back pain without sciatica 09/15/2016   Osteopenia 07/20/2016   Bilateral finger numbness 06/03/2016   Joint pain 06/03/2016   Hyperlipidemia 02/09/2015   Bilateral primary osteoarthritis of knee 10/03/2013   Lateral meniscal tear 10/03/2013   Patellofemoral pain syndrome 10/03/2013   Varicose vein of leg    Allergic rhinitis    IBS (irritable bowel syndrome) 04/07/2011   Epigastric pain 12/26/2010   Diarrhea 12/25/2010   Migraine headache 09/27/2010   Essential hypertension 09/27/2010   GERD 09/27/2010   RENAL CALCULUS, HX OF 09/27/2010    Past Medical History:  Diagnosis Date   Arthritis    Atypical chest pain    Diverticulosis    Gastritis 11/08/2007, 04/12/2012   H pylori bx neg 03/2012   GERD (gastroesophageal reflux disease) 11/08/2007, 04/12/2012   Hepatic cyst    Hiatal hernia 11/08/2007, 04/12/2012   History of kidney stones    Hyperlipidemia    Hypertension    IBS (irritable bowel syndrome)    diarrhea predom   Migraine    Mitral regurgitation    Osteoporosis    DEXA 01/21/2012: -2.1 spine and R fem, -1.8 L fem s/p fosamax  2004-2011 DEXA 04/18/14 @ [facility]: -2.5 - rec to start Prolia     PVC's (premature ventricular contractions)    S/P  minimally-invasive mitral valve repair 05/21/2021   Complex valvuloplasty including PTFE neochord placement x6 with 30 mm Medtronic Simuform ring annuloplasty with clipping of LA appendage   Schatzki's ring 11/11/2010   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   Shingles outbreak 04/2013    Family History  Problem Relation Age of Onset   Hypertension  Mother    Alzheimer's disease Mother 64   AAA (abdominal aortic aneurysm) Mother    Stroke Father 75   Kidney cancer Brother        mets   Bone cancer Brother 27   Colon cancer Neg Hx    Past Surgical History:  Procedure Laterality Date   ANTERIOR CERVICAL DECOMP/DISCECTOMY FUSION N/A 05/06/2022   Procedure: CERVICAL THREE-FOUR, CERVICAL FOUR-FIVE ANTERIOR CERVICAL DECOMPRESSION/DISCECTOMY FUSION;  Surgeon: Earnie Larsson, MD;  Location: [facility];  Service: Neurosurgery;  Laterality: N/A;   [place] STUDY  03/14/2021   Procedure: BUBBLE STUDY;  Surgeon: Sueanne Margarita, MD;  Location: [facility];  Service: Cardiovascular;;   CARDIAC CATHETERIZATION     CLIPPING OF ATRIAL APPENDAGE N/A 05/21/2021   Procedure: CLIPPING OF ATRIAL APPENDAGE USING ATRICURE 40MM PRO2 CLIP;  Surgeon: Rexene Alberts, MD;  Location: [facility];  Service: Open Heart Surgery;  Laterality: N/A;   COLONOSCOPY     CYSTOCELE REPAIR  2008   ESOPHAGOGASTRODUODENOSCOPY     Maxilofacial  1992   MITRAL VALVE REPAIR  05/21/2021   Procedure: MINIMALLY INVASIVE MITRAL VALVE REPAIR (MVR) USING MEDTRONIC SIMUFORM 30MM RING;  Surgeon: Rexene Alberts, MD;  Location: [facility];  Service: Open Heart Surgery;;   RIGHT/LEFT HEART CATH AND CORONARY ANGIOGRAPHY N/A 03/14/2021   Procedure: RIGHT/LEFT HEART CATH AND CORONARY ANGIOGRAPHY;  Surgeon: Lorretta Harp, MD;  Location: [facility];  Service: Cardiovascular;  Laterality: N/A;   TEE WITHOUT CARDIOVERSION N/A 03/14/2021   Procedure: TRANSESOPHAGEAL ECHOCARDIOGRAM (TEE);  Surgeon: Sueanne Margarita, MD;  Location: [facility] ENDOSCOPY;  Service: Cardiovascular;  Laterality: N/A;   TEE WITHOUT CARDIOVERSION N/A 05/21/2021   Procedure: TRANSESOPHAGEAL ECHOCARDIOGRAM (TEE);  Surgeon: Rexene Alberts, MD;  Location: [facility];  Service: Open Heart Surgery;  Laterality: N/A;   TONSILLECTOMY  1960   Social History   Social History Narrative   Married, lives with spouse.  Retired Licensed conveyancer, Print production planner. Has 3 kids (moved to [place] to be closer)- youngest son MD. Dorie Rank to [place] from [place] 06/2010, lived in [place] x 10years   Immunization History  Administered Date(s) Administered   Fluad Quad(high Dose 65+) 11/02/2019   Influenza Split 10/13/2011, 08/24/2012   Influenza Whole 09/27/2010   Influenza, High Dose Seasonal PF 10/02/2017   Influenza,inj,Quad PF,6+ Mos 08/01/2013, 10/30/2015   Influenza-Unspecified 09/22/2016   PFIZER(Purple Top)SARS-COV-2 Vaccination 02/03/2020, 02/28/2020   Pneumococcal Conjugate-13 04/16/2015   Pneumococcal Polysaccharide-23 01/14/2012   Tetanus 01/14/2012     Objective: Vital Signs: There were no vitals taken for this visit.   Physical Exam Vitals and nursing note reviewed.  Constitutional:      Appearance: She is well-developed.  HENT:     Head: Normocephalic and atraumatic.  Eyes:     Conjunctiva/sclera: Conjunctivae normal.  Cardiovascular:     Rate and Rhythm: Normal rate and regular rhythm.     Heart sounds: Normal heart sounds.  Pulmonary:     Effort: Pulmonary effort is normal.     Breath sounds: Normal breath sounds.  Abdominal:     General: Bowel sounds are normal.  Palpations: Abdomen is soft.  Musculoskeletal:     Cervical back: Normal range of motion.  Skin:    General: Skin is warm and dry.     Capillary Refill: Capillary refill takes less than 2 seconds.  Neurological:     Mental Status: She is alert and oriented to person, place, and time.  Psychiatric:        Behavior: Behavior normal.      Musculoskeletal Exam: ***  CDAI Exam: CDAI Score: -- Patient Global: --; Provider Global: -- Swollen: --; Tender: -- Joint Exam 01/15/2023   No joint exam has been documented for this visit   There is currently no information documented on the homunculus. Go to the Rheumatology activity and complete the homunculus joint exam.  Investigation: No additional findings.  Imaging: No results  found.  Recent Labs: Lab Results  Component Value Date   WBC 7.2 04/28/2022   HGB 14.0 04/28/2022   PLT 209 04/28/2022   NA 139 07/01/2022   K 4.0 07/01/2022   CL 102 07/01/2022   CO2 26 07/01/2022   GLUCOSE 90 07/01/2022   BUN 28 (H) 07/01/2022   CREATININE 0.95 07/01/2022   BILITOT 0.6 07/01/2022   ALKPHOS 39 07/01/2022   AST 26 07/01/2022   ALT 20 07/01/2022   PROT 7.3 07/01/2022   ALBUMIN 4.4 07/01/2022   CALCIUM 10.0 07/01/2022    Speciality Comments: No specialty comments available.  Procedures:  No procedures performed Allergies: Patient has no known allergies.   Assessment / Plan:     Visit Diagnoses: Arthralgia of both hands - 10/13/22: ESR 8, CRP<1  Chronic TMJ pain  Chronic pain of both knees  Orders: No orders of the defined types were placed in this encounter.  No orders of the defined types were placed in this encounter.     Follow-Up Instructions: No follow-ups on file.   Ofilia Neas, PA-C  Note - This record has been created using Dragon software.  Chart creation errors have been sought, but may not always  have been located. Such creation errors do not reflect on  the standard of medical care.

## 2023-01-05 DIAGNOSIS — S134XXD Sprain of ligaments of cervical spine, subsequent encounter: Secondary | ICD-10-CM | POA: Diagnosis not present

## 2023-01-14 DIAGNOSIS — M67912 Unspecified disorder of synovium and tendon, left shoulder: Secondary | ICD-10-CM | POA: Diagnosis not present

## 2023-01-14 DIAGNOSIS — M25511 Pain in right shoulder: Secondary | ICD-10-CM | POA: Diagnosis not present

## 2023-01-15 ENCOUNTER — Encounter: Payer: Medicare HMO | Admitting: Physician Assistant

## 2023-01-15 DIAGNOSIS — G8929 Other chronic pain: Secondary | ICD-10-CM

## 2023-01-15 DIAGNOSIS — M25541 Pain in joints of right hand: Secondary | ICD-10-CM

## 2023-01-21 ENCOUNTER — Telehealth: Payer: Self-pay | Admitting: Gastroenterology

## 2023-01-21 NOTE — Progress Notes (Unsigned)
[name] [name] [place] [address] [place] Phone: [phone] Subjective:   IVilma [name], am serving as a scribe for Dr. Hulan [name].  I'm seeing this patient by the request  of:  [place], Betty G, MD  CC: trigger finger   [id number]  09/29/2022 Chronic problem with exacerbation. We discussed bracing at night, we will discuss have the prognosis of this is. Secondary to social determinants of health and chronic comorbidities including the atrial fibrillation and stroke this is likely the thing that will be most beneficial. Patient wants to avoid surgical intervention. Discussed we can repeat every 3 to 4 months if needed. Follow-up with me again in 6 to 8 weeks.   01/22/2023 [name] [name] is a 77 y.o. female coming in with complaint of R shoulder and L trigger finger pain. Patient states saw Dr. Tamera [name] for shoulder. He gave her injections in both. Noted deterioration and offered the option of surgery. She does not want surgery. Rotator cuff doesn't look great. Right hand trigger finger in thumb and pointer. Pinky finger is tender to touch.      Past Medical History:  Diagnosis Date   Arthritis    Atypical chest pain    Diverticulosis    Gastritis 11/08/2007, 04/12/2012   H pylori bx neg 03/2012   GERD (gastroesophageal reflux disease) 11/08/2007, 04/12/2012   Hepatic cyst    Hiatal hernia 11/08/2007, 04/12/2012   History of kidney stones    Hyperlipidemia    Hypertension    IBS (irritable bowel syndrome)    diarrhea predom   Migraine    Mitral regurgitation    Osteoporosis    DEXA 01/21/2012: -2.1 spine and R fem, -1.8 L fem s/p fosamax  2004-2011 DEXA 04/18/14 @ [facility]: -2.5 - rec to start Prolia     PVC's (premature ventricular contractions)    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   Complex valvuloplasty including PTFE neochord placement x6 with 30 mm Medtronic Simuform ring annuloplasty with clipping of LA appendage    Schatzki's ring 11/11/2010   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   Shingles outbreak 04/2013   Past Surgical History:  Procedure Laterality Date   ANTERIOR CERVICAL DECOMP/DISCECTOMY FUSION N/A 05/06/2022   Procedure: CERVICAL THREE-FOUR, CERVICAL FOUR-FIVE ANTERIOR CERVICAL DECOMPRESSION/DISCECTOMY FUSION;  Surgeon: [name] Larsson, MD;  Location: [facility];  Service: Neurosurgery;  Laterality: N/A;   [place] STUDY  03/14/2021   Procedure: BUBBLE STUDY;  Surgeon: [name] Margarita, MD;  Location: [facility];  Service: Cardiovascular;;   CARDIAC CATHETERIZATION     CLIPPING OF ATRIAL APPENDAGE N/A 05/21/2021   Procedure: CLIPPING OF ATRIAL APPENDAGE USING ATRICURE 40MM PRO2 CLIP;  Surgeon: [name] Alberts, MD;  Location: [facility];  Service: Open Heart Surgery;  Laterality: N/A;   COLONOSCOPY     CYSTOCELE REPAIR  2008   ESOPHAGOGASTRODUODENOSCOPY     Maxilofacial  1992   MITRAL VALVE REPAIR  05/21/2021   Procedure: MINIMALLY INVASIVE MITRAL VALVE REPAIR (MVR) USING MEDTRONIC SIMUFORM 30MM RING;  Surgeon: [name] Alberts, MD;  Location: [facility];  Service: Open Heart Surgery;;   RIGHT/LEFT HEART CATH AND CORONARY ANGIOGRAPHY N/A 03/14/2021   Procedure: RIGHT/LEFT HEART CATH AND CORONARY ANGIOGRAPHY;  Surgeon: [name] Harp, MD;  Location: [facility];  Service: Cardiovascular;  Laterality: N/A;   TEE WITHOUT CARDIOVERSION N/A 03/14/2021   Procedure: TRANSESOPHAGEAL ECHOCARDIOGRAM (TEE);  Surgeon: [name] Margarita, MD;  Location: [facility];  Service: Cardiovascular;  Laterality: N/A;   TEE WITHOUT CARDIOVERSION N/A 05/21/2021   Procedure: TRANSESOPHAGEAL ECHOCARDIOGRAM (TEE);  Surgeon: [name] Alberts, MD;  Location: [facility];  Service: Open Heart Surgery;  Laterality: N/A;   TONSILLECTOMY  1960   Social History   Socioeconomic History   Marital status: Married    Spouse name: [name] [name]   Number of children: 3   Years of education: Not on file   Highest  education level: Master's degree (e.[name]., MA, MS, MEng, MEd, MSW, MBA)  Occupational History   Occupation: retired  Tobacco Use   Smoking status: Never   Smokeless tobacco: Never  Vaping Use   Vaping Use: Never used  Substance and Sexual Activity   Alcohol use: No   Drug use: No   Sexual activity: Not on file  Other Topics Concern   Not on file  Social History Narrative   Married, lives with spouse. Retired Licensed conveyancer, Print production planner. Has 3 kids (moved to [place] to be closer)- youngest son MD. Dorie Rank to [place] from [place] 06/2010, lived in [place] x 10years   Social Determinants of Health   Financial Resource Strain: [place]  (02/06/2022)   Overall Financial Resource Strain (CARDIA)    Difficulty of Paying Living Expenses: Not hard at all  Food Insecurity: No Food Insecurity (02/06/2022)   Hunger Vital Sign    Worried About Running Out of Food in the Last Year: Never true    [place] in the Last Year: Never true  Transportation Needs: No Transportation Needs (02/06/2022)   PRAPARE - Hydrologist (Medical): No    Lack of Transportation (Non-Medical): No  Physical Activity: Inactive (02/06/2022)   Exercise Vital Sign    Days of Exercise per Week: 0 days    Minutes of Exercise per Session: 0 min  Stress: No Stress Concern Present (02/06/2022)   [place]    Feeling of Stress : Not at all  Social Connections: [place] (02/06/2022)   Social Connection and Isolation Panel [NHANES]    Frequency of Communication with Friends and Family: More than three times a week    Frequency of Social Gatherings with Friends and Family: More than three times a week    Attends Religious Services: More than 4 times per year    Active Member of [employer] or Organizations: Yes    Attends Music therapist: More than 4 times per year    Marital Status: Married   No Known Allergies Family  History  Problem Relation Age of Onset   Hypertension Mother    Alzheimer's disease Mother 75   AAA (abdominal aortic aneurysm) Mother    Stroke Father 81   Kidney cancer Brother        mets   Bone cancer Brother 19   Colon cancer Neg Hx      Current Outpatient Medications (Cardiovascular):    amLODipine (NORVASC) 5 MG tablet, Take 1 tablet (5 mg total) by mouth daily.   atorvastatin (LIPITOR) 40 MG tablet, TAKE ONE TABLET BY MOUTH ONCE DAILY   cloNIDine (CATAPRES) 0.1 MG tablet, Take 1 tablet (0.1 mg total) by mouth daily as needed. If blood pressure 160/90 or higher.   metoprolol succinate (TOPROL-XL) 25 MG 24 hr tablet, TAKE ONE TABLET BY MOUTH EVERY MORNING and TAKE ONE TABLET BY MOUTH EVERY EVENING (Patient taking differently: TAKE ONE TABLET BY MOUTH EVERY MORNING and TAKE ONE  TABLET BY MOUTH EVERY NIGHT)   Current Outpatient Medications (Analgesics):    acetaminophen (TYLENOL) 650 MG CR tablet, Take 650 mg by mouth every 8 (eight) hours as needed for pain (Headache).   aspirin EC 81 MG EC tablet, Take 1 tablet (81 mg total) by mouth daily. Swallow whole.   traMADol (ULTRAM) 50 MG tablet, Take 0.5-1 tablets (25-50 mg total) by mouth every 12 (twelve) hours as needed.  Current Outpatient Medications (Hematological):    Cyanocobalamin (VITAMIN B-12) 2500 MCG SUBL, Place 2,500 mcg under the tongue daily.   warfarin (COUMADIN) 2 MG tablet, TAKE 1 TABLET TO 1 1/2 TABLETS BY MOUTH DAILY AS DIRECTED BY COUMADIN CLINIC  Current Outpatient Medications (Other):    carbamide peroxide (DEBROX) 6.5 % OTIC solution, Place 5 drops into the left ear 2 (two) times daily.   dicyclomine (BENTYL) 10 MG capsule, Take 1 capsule (10 mg total) by mouth 2 (two) times daily.   gabapentin (NEURONTIN) 100 MG capsule, Take 1 capsule (100 mg total) by mouth at bedtime.   Peppermint Oil (IBGARD) 90 MG CPCR, Use as needed (Patient taking differently: Take 90 mg by mouth daily as needed (Stomach control). Use  as needed)   Polyethyl Glycol-Propyl Glycol (SYSTANE) 0.4-0.3 % SOLN, Place 1 drop into both eyes 2 (two) times daily.   Reviewed prior external information including notes and imaging from  primary care provider As well as notes that were available from care everywhere and other healthcare systems.  Past medical history, social, surgical and family history all reviewed in electronic medical record.  No pertanent information unless stated regarding to the chief complaint.   Review of Systems:  No headache, visual changes, nausea, vomiting, diarrhea, constipation, dizziness, abdominal pain, skin rash, fevers, chills, night sweats, weight loss, swollen lymph nodes,  chest pain, shortness of breath, mood changes. POSITIVE muscle aches, body aches, joint swelling  Objective  Blood pressure 116/78, pulse 74, height '5\' 3"'$  (1.6 m), weight 136 lb (61.7 kg), SpO2 97 %.   General: No apparent distress alert and oriented x3 mood and affect normal, dressed appropriately.  HEENT: Pupils equal, extraocular movements intact  Respiratory: Patient's speak in full sentences and does not appear short of breath  Cardiovascular: No lower extremity edema, non tender, no erythema  The patient's right hand does have a trigger nodule noted of the thumb, index finger and the small finger on the right side.  Patient's grip strength is minorly less at the moment.  Negative Tinel's though of the median nerve  Procedure: Real-time Ultrasound Guided Injection of right flexor tendon sheath first finger Device: GE Logiq Q7 Ultrasound guided injection is preferred based studies that show increased duration, increased effect, greater accuracy, decreased procedural pain, increased response rate, and decreased cost with ultrasound guided versus blind injection.  Verbal informed consent obtained.  Time-out conducted.  Noted no overlying erythema, induration, or other signs of local infection.  Skin prepped in a sterile  fashion.  Local anesthesia: Topical Ethyl chloride.  With sterile technique and under real time ultrasound guidance: With a 25-gauge half inch needle injected with 0.5 cc of 0.5% Marcaine and 0.5 cc of Kenalog 40 mg/mL Completed without difficulty  Pain immediately resolved suggesting accurate placement of the medication.  Advised to call if fevers/chills, erythema, induration, drainage, or persistent bleeding.  Impression: Technically successful ultrasound guided injection.   Procedure: Real-time Ultrasound Guided Injection of right index finger tendon flexor sheath Device: GE Logiq Q7 Ultrasound guided injection is preferred  based studies that show increased duration, increased effect, greater accuracy, decreased procedural pain, increased response rate, and decreased cost with ultrasound guided versus blind injection.  Verbal informed consent obtained.  Time-out conducted.  Noted no overlying erythema, induration, or other signs of local infection.  Skin prepped in a sterile fashion.  Local anesthesia: Topical Ethyl chloride.  With sterile technique and under real time ultrasound guidance: With a 25-gauge half inch needle injected with 0.5 cc of 0.5% Marcaine and 0.5 cc of Kenalog 40 mg/mL Completed without difficulty  Pain immediately resolved suggesting accurate placement of the medication.  Advised to call if fevers/chills, erythema, induration, drainage, or persistent bleeding.  Impression: Technically successful ultrasound guided injection.  Procedure: Real-time Ultrasound Guided Injection of right fifth flexor tendon sheath Device: GE Logiq Q7 Ultrasound guided injection is preferred based studies that show increased duration, increased effect, greater accuracy, decreased procedural pain, increased response rate, and decreased cost with ultrasound guided versus blind injection.  Verbal informed consent obtained.  Time-out conducted.  Noted no overlying erythema, induration, or  other signs of local infection.  Skin prepped in a sterile fashion.  Local anesthesia: Topical Ethyl chloride.  With sterile technique and under real time ultrasound guidance: With a 25-gauge half inch needle injected with 0.5 cc of 0.5% Marcaine and 0.5 cc of Kenalog 40 mg/mL Completed without difficulty  Pain immediately resolved suggesting accurate placement of the medication.  Advised to call if fevers/chills, erythema, induration, drainage, or persistent bleeding.  Impression: Technically successful ultrasound guided injection.   Impression and Recommendations:    The above documentation has been reviewed and is accurate and complete Lyndal Pulley, DO

## 2023-01-21 NOTE — Telephone Encounter (Signed)
Inbound call from pt wanted to speak with a nurse ,patient stated she is having a lot issues with her colon and is having rectal bleeding and discomfort.she is schedule for a OV 4/16 but pt stated she  can't wait the much.Please advise

## 2023-01-22 ENCOUNTER — Ambulatory Visit: Payer: Medicare HMO | Admitting: Family Medicine

## 2023-01-22 ENCOUNTER — Ambulatory Visit: Payer: Self-pay

## 2023-01-22 VITALS — BP 116/78 | HR 74 | Ht 63.0 in | Wt 136.0 lb

## 2023-01-22 DIAGNOSIS — M25511 Pain in right shoulder: Secondary | ICD-10-CM | POA: Diagnosis not present

## 2023-01-22 DIAGNOSIS — M65342 Trigger finger, left ring finger: Secondary | ICD-10-CM

## 2023-01-22 DIAGNOSIS — G5603 Carpal tunnel syndrome, bilateral upper limbs: Secondary | ICD-10-CM

## 2023-01-22 DIAGNOSIS — M2550 Pain in unspecified joint: Secondary | ICD-10-CM | POA: Diagnosis not present

## 2023-01-22 DIAGNOSIS — M170 Bilateral primary osteoarthritis of knee: Secondary | ICD-10-CM

## 2023-01-22 DIAGNOSIS — M65311 Trigger thumb, right thumb: Secondary | ICD-10-CM

## 2023-01-22 DIAGNOSIS — M65351 Trigger finger, right little finger: Secondary | ICD-10-CM

## 2023-01-22 DIAGNOSIS — M65321 Trigger finger, right index finger: Secondary | ICD-10-CM

## 2023-01-22 HISTORY — DX: Trigger thumb, right thumb: M65.311

## 2023-01-22 LAB — CBC WITH DIFFERENTIAL/PLATELET
Basophils Absolute: 0 10*3/uL (ref 0.0–0.1)
Basophils Relative: 0.4 % (ref 0.0–3.0)
Eosinophils Absolute: 0.2 10*3/uL (ref 0.0–0.7)
Eosinophils Relative: 1.9 % (ref 0.0–5.0)
HCT: 42.9 % (ref 36.0–46.0)
Hemoglobin: 14.5 g/dL (ref 12.0–15.0)
Lymphocytes Relative: 25.1 % (ref 12.0–46.0)
Lymphs Abs: 2.1 10*3/uL (ref 0.7–4.0)
MCHC: 33.9 g/dL (ref 30.0–36.0)
MCV: 98.4 fl (ref 78.0–100.0)
Monocytes Absolute: 1 10*3/uL (ref 0.1–1.0)
Monocytes Relative: 11.7 % (ref 3.0–12.0)
Neutro Abs: 5.1 10*3/uL (ref 1.4–7.7)
Neutrophils Relative %: 60.9 % (ref 43.0–77.0)
Platelets: 220 10*3/uL (ref 150.0–400.0)
RBC: 4.35 Mil/uL (ref 3.87–5.11)
RDW: 14.2 % (ref 11.5–15.5)
WBC: 8.3 10*3/uL (ref 4.0–10.5)

## 2023-01-22 LAB — COMPREHENSIVE METABOLIC PANEL
ALT: 22 U/L (ref 0–35)
AST: 22 U/L (ref 0–37)
Albumin: 3.7 g/dL (ref 3.5–5.2)
Alkaline Phosphatase: 38 U/L — ABNORMAL LOW (ref 39–117)
BUN: 30 mg/dL — ABNORMAL HIGH (ref 6–23)
CO2: 24 mEq/L (ref 19–32)
Calcium: 9.5 mg/dL (ref 8.4–10.5)
Chloride: 105 mEq/L (ref 96–112)
Creatinine, Ser: 0.88 mg/dL (ref 0.40–1.20)
GFR: 63.29 mL/min (ref >60.00)
Glucose, Bld: 102 mg/dL — ABNORMAL HIGH (ref 70–99)
Potassium: 4 mEq/L (ref 3.5–5.1)
Sodium: 140 mEq/L (ref 135–145)
Total Bilirubin: 0.7 mg/dL (ref 0.2–1.2)
Total Protein: 6.2 g/dL (ref 6.0–8.3)

## 2023-01-22 LAB — IBC PANEL
Iron: 194 ug/dL — ABNORMAL HIGH (ref 42–145)
Saturation Ratios: 63.3 % — ABNORMAL HIGH (ref 20.0–50.0)
TIBC: 306.6 ug/dL (ref 250.0–450.0)
Transferrin: 219 mg/dL (ref 212.0–360.0)

## 2023-01-22 LAB — TSH: TSH: 0.55 u[IU]/mL (ref 0.35–5.50)

## 2023-01-22 LAB — C-REACTIVE PROTEIN: CRP: <1 mg/dL (ref 0.5–20.0)

## 2023-01-22 LAB — URIC ACID: Uric Acid, Serum: 5.3 mg/dL (ref 2.4–7.0)

## 2023-01-22 LAB — VITAMIN D 25 HYDROXY (VIT D DEFICIENCY, FRACTURES): VITD: 65.1 ng/mL (ref 30.00–100.00)

## 2023-01-22 LAB — SEDIMENTATION RATE: Sed Rate: 8 mm/hr (ref 0–30)

## 2023-01-22 LAB — FERRITIN: Ferritin: 17.4 ng/mL (ref 10.0–291.0)

## 2023-01-22 NOTE — Assessment & Plan Note (Signed)
Patient on exam today he did not have any worsening pain with Tinel's.  Discussed that we should consider the possibility of further evaluation of this, will refer to hand surgery with patient wanting that at this moment.

## 2023-01-22 NOTE — Assessment & Plan Note (Addendum)
Discussed with patient at great length.  Patient does have some signs and symptoms consistent with possible autoimmune disease.  We have discussed the possibility of referral to rheumatology.  Will get labs first to see if anything is potentially positive such as ANA or any other autoimmune labs.  Will get sedimentation rate as well.  Uric acid vitamins to see if anything needs to be supplemented.  Follow-up again in 6 to 8 weeks did discuss with the patient and her spouse that I am able to talk to their son who is a doctor if needed.

## 2023-01-22 NOTE — Assessment & Plan Note (Signed)
Patient given injection and tolerated the procedure well, discussed bracing at night, will refer to orthopedic surgery hand surgeon to discuss surgical management.  Affecting daily activities on a regular basis.

## 2023-01-22 NOTE — Assessment & Plan Note (Signed)
Chronic problem with recent exacerbation.  Has trigger points of multiple other fingers as well we will need to continue to monitor.  Concerned the patient could be having early Dupuytren's.  Patient has had multiple injections previously but will refer patient to discuss the possibility of surgical intervention.  Follow-up with me as needed for this problem.

## 2023-01-22 NOTE — Patient Instructions (Addendum)
Dr. Amedeo Plenty referral See you again in 10 weeks Labs today

## 2023-01-22 NOTE — Assessment & Plan Note (Signed)
Will address again at follow-up in the next 10 weeks if needed.

## 2023-01-23 NOTE — Telephone Encounter (Signed)
Patient returned call

## 2023-01-23 NOTE — Telephone Encounter (Signed)
Called the patient. No sooner appointments to offer. Left a message of the returned call.

## 2023-01-24 LAB — ANTI-NUCLEAR AB-TITER (ANA TITER): ANA Titer 1: 1:40 {titer} — ABNORMAL HIGH

## 2023-01-24 LAB — PTH, INTACT AND CALCIUM
Calcium: 9.4 mg/dL (ref 8.6–10.4)
PTH: 30 pg/mL (ref 16–77)

## 2023-01-24 LAB — CYCLIC CITRUL PEPTIDE ANTIBODY, IGG: Cyclic Citrullin Peptide Ab: <16 UNITS

## 2023-01-24 LAB — CALCIUM, IONIZED: Calcium, Ion: 5.3 mg/dL (ref 4.7–5.5)

## 2023-01-24 LAB — ANA: Anti Nuclear Antibody (ANA): POSITIVE — AB

## 2023-01-24 LAB — ANGIOTENSIN CONVERTING ENZYME: Angiotensin-Converting Enzyme: 11 U/L (ref 9–67)

## 2023-01-24 LAB — RHEUMATOID FACTOR: Rheumatoid fact SerPl-aCnc: <14 IU/mL (ref <14)

## 2023-01-27 DIAGNOSIS — M79642 Pain in left hand: Secondary | ICD-10-CM | POA: Diagnosis not present

## 2023-01-27 DIAGNOSIS — M65321 Trigger finger, right index finger: Secondary | ICD-10-CM | POA: Insufficient documentation

## 2023-01-27 DIAGNOSIS — M79641 Pain in right hand: Secondary | ICD-10-CM | POA: Insufficient documentation

## 2023-01-27 DIAGNOSIS — M65311 Trigger thumb, right thumb: Secondary | ICD-10-CM | POA: Diagnosis not present

## 2023-01-27 NOTE — Telephone Encounter (Signed)
Left message to call back. No sooner appointments.

## 2023-01-28 NOTE — Telephone Encounter (Signed)
Inbound call from patient returning call. Please advise.

## 2023-01-29 ENCOUNTER — Ambulatory Visit: Payer: Medicare HMO | Attending: Cardiovascular Disease | Admitting: *Deleted

## 2023-01-29 DIAGNOSIS — Z5181 Encounter for therapeutic drug level monitoring: Secondary | ICD-10-CM | POA: Diagnosis not present

## 2023-01-29 DIAGNOSIS — I340 Nonrheumatic mitral (valve) insufficiency: Secondary | ICD-10-CM | POA: Diagnosis not present

## 2023-01-29 LAB — POCT INR: POC INR: 1.8

## 2023-01-29 NOTE — Patient Instructions (Signed)
Description    Take 2 tablets of warfarin today and then continue taking 1.5 tablets daily except for 1 tablet on Wednesday.  Recheck INR in 3 weeks. [facility] [phone];

## 2023-02-03 ENCOUNTER — Ambulatory Visit: Payer: Medicare HMO | Admitting: Family Medicine

## 2023-02-04 ENCOUNTER — Telehealth: Payer: Self-pay | Admitting: Family Medicine

## 2023-02-04 NOTE — Telephone Encounter (Signed)
Called patient to schedule Medicare Annual Wellness Visit (AWV). Left message for patient to call back and schedule Medicare Annual Wellness Visit (AWV).  Last date of AWV: 02/06/22  Left message asking patient to call [phone].  Schedule change need to change 3/18 appt to phone appt if patient wants in office appt please r/s to Wed/Friday with [facility]   If any questions, please contact me at [phone].  Thank you ,  Barkley Boards AWV direct phone # [phone]

## 2023-02-05 ENCOUNTER — Telehealth: Payer: Self-pay | Admitting: Family Medicine

## 2023-02-05 NOTE — Telephone Encounter (Signed)
[place] to schedule their annual wellness visit. Appointment made for 02/27/23.  Barkley Boards AWV direct phone # [phone]   Needed to r/s 3/18   to 4/5

## 2023-02-05 NOTE — Telephone Encounter (Signed)
No openings on the schedules for a sooner appointment.

## 2023-02-09 DIAGNOSIS — M25511 Pain in right shoulder: Secondary | ICD-10-CM | POA: Diagnosis not present

## 2023-02-09 DIAGNOSIS — M25512 Pain in left shoulder: Secondary | ICD-10-CM | POA: Diagnosis not present

## 2023-02-19 ENCOUNTER — Encounter: Payer: Medicare HMO | Admitting: Physician Assistant

## 2023-02-22 ENCOUNTER — Other Ambulatory Visit: Payer: Self-pay | Admitting: Cardiovascular Disease

## 2023-02-22 DIAGNOSIS — I480 Paroxysmal atrial fibrillation: Secondary | ICD-10-CM

## 2023-02-23 ENCOUNTER — Ambulatory Visit: Payer: Medicare HMO | Attending: Internal Medicine | Admitting: *Deleted

## 2023-02-23 DIAGNOSIS — I340 Nonrheumatic mitral (valve) insufficiency: Secondary | ICD-10-CM

## 2023-02-23 DIAGNOSIS — Z9889 Other specified postprocedural states: Secondary | ICD-10-CM

## 2023-02-23 DIAGNOSIS — Z7901 Long term (current) use of anticoagulants: Secondary | ICD-10-CM | POA: Diagnosis not present

## 2023-02-23 LAB — POCT INR: INR: 2.7 (ref 2.0–3.0)

## 2023-02-23 NOTE — Patient Instructions (Signed)
Description   Continue taking 1.5 tablets daily except for 1 tablet on Wednesday. Recheck INR in 4 weeks. [facility] [phone]; Procedure Fax Number [phone] or [phone]

## 2023-03-03 ENCOUNTER — Ambulatory Visit: Payer: PPO | Admitting: Family Medicine

## 2023-03-04 ENCOUNTER — Encounter: Payer: Self-pay | Admitting: Family Medicine

## 2023-03-06 ENCOUNTER — Telehealth: Payer: Self-pay | Admitting: Family Medicine

## 2023-03-06 NOTE — Telephone Encounter (Signed)
I left patient a voicemail to call back. Okay to schedule for anytime. Estimated co-pay is $315.

## 2023-03-06 NOTE — Telephone Encounter (Signed)
Pt calling to see if it is time for her next prolia

## 2023-03-09 ENCOUNTER — Ambulatory Visit (INDEPENDENT_AMBULATORY_CARE_PROVIDER_SITE_OTHER): Payer: Medicare HMO

## 2023-03-09 DIAGNOSIS — M816 Localized osteoporosis [Lequesne]: Secondary | ICD-10-CM | POA: Diagnosis not present

## 2023-03-09 MED ORDER — DENOSUMAB 60 MG/ML ~~LOC~~ SOSY
60.0000 mg | PREFILLED_SYRINGE | Freq: Once | SUBCUTANEOUS | Status: AC
  Administered 2023-03-09: 60 mg via SUBCUTANEOUS

## 2023-03-09 NOTE — Telephone Encounter (Signed)
Placed a call to [facility] at [phone] and the patient has an authorization on file for Prolia that is active from 03/03/23 to 03/02/24.

## 2023-03-09 NOTE — Progress Notes (Signed)
Per orders of Dr. Jordan, injection of Prolia given by Valerie Cones E Annice Jolly. Patient tolerated injection well.  

## 2023-03-09 NOTE — Telephone Encounter (Signed)
Received notification patient received Prolia injection today- Per [employer] report PA is required. No documentation of PA approval on file.  Team, please submit PA for today's date of service.

## 2023-03-10 ENCOUNTER — Encounter: Payer: Self-pay | Admitting: Gastroenterology

## 2023-03-10 ENCOUNTER — Ambulatory Visit: Payer: Medicare HMO | Admitting: Gastroenterology

## 2023-03-10 VITALS — BP 128/68 | HR 72 | Ht 63.0 in | Wt 135.1 lb

## 2023-03-10 DIAGNOSIS — K602 Anal fissure, unspecified: Secondary | ICD-10-CM

## 2023-03-10 DIAGNOSIS — R1310 Dysphagia, unspecified: Secondary | ICD-10-CM | POA: Diagnosis not present

## 2023-03-10 DIAGNOSIS — K625 Hemorrhage of anus and rectum: Secondary | ICD-10-CM

## 2023-03-10 DIAGNOSIS — K219 Gastro-esophageal reflux disease without esophagitis: Secondary | ICD-10-CM

## 2023-03-10 DIAGNOSIS — R194 Change in bowel habit: Secondary | ICD-10-CM

## 2023-03-10 MED ORDER — DILTIAZEM GEL 2 %: 1.0000 | Freq: Three times a day (TID) | CUTANEOUS | 1 refills | Status: DC

## 2023-03-10 MED ORDER — FAMOTIDINE 20 MG PO TABS: 20.0000 mg | ORAL_TABLET | Freq: Every day | ORAL | 3 refills | Status: DC

## 2023-03-10 MED ORDER — CITRUCEL PO POWD: 1.0000 | Freq: Every day | ORAL | 1 refills | Status: DC

## 2023-03-10 NOTE — Progress Notes (Signed)
HPI :  77 year old female accompanied by her husband today.  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  I last saw her in December 2022 although she was seen by Willette Cluster this past October.  She has a few issues she would like to discuss today.  She has been having some reflux symptoms more recently that has been bothering her, she has had this intermittently for some time.  She states symptoms are mostly postprandial, mainly pyrosis.  She has some food sensitivity to spicy foods and other things that can make her symptoms worse.  She denies much nocturnal symptoms.  She has been using some Tums or milk as needed but not taking any medications for this.  She does have a history of osteoporosis.  She had been on omeprazole in the past.  She had a neck surgery, fusion of her C-spine, this past June.  She states she has had some slight dysphagia since that time, states it feels constricted in her neck.  Recall her last EGD with me was in September 2021, no problems with her esophagus were noted, no stenosis.  She otherwise reported she had some rectal bleeding in February, had a few days worth of what she states was a fair amount of blood per rectum in the setting of passing some hard stools.  She denied much rectal pain at the time.  She is on Coumadin.  She states since then no significant bleeding, has had a few episodes of superficial drops on the toilet paper but nothing worried about.  She states her bowel can alternate and stool form can range from loose to constipated.  She does take IBgard for gas and bowel spasms which helps.  She also takes Bentyl as needed as well.  Her abdominal gas and discomfort can be reliably relieved with a bowel movement.  Her last colonoscopy was in 2021 without any concerning findings.     Endoscopic history : EGD 04/12/2012 - 3cm HH, otherwise normal, clo test negative Colonoscopy 04/12/2012 - diverticulosis, no  polyps EGD 10/2008 - mild Shatski ring dilated to 54Fr Elease Hashimoto, small HH   CT scan 01/05/2018 - multiple hepatic cysts, mild diffuse hepatic steatosis, normal GB, normal pancreas. "Mild enhancement" of left colon, nonspecific   HIDA 01/01/2017 - normal [place] RUQ 12/26/2016 - no gallstones   CT scan 05/03/20 - IMPRESSION: 1. No diverticulitis. 2. No explanation for abdominal pain. 3. Small hiatal hernia. 4. Benign-appearing cysts of the liver and kidneys.   CBC and LFTs normal   GI pathogen panel 07/26/20 negative   Colonoscopy 08/06/20 -  - The terminal ileum appeared normal. - A localized area of mucosa was found in the distal rectum / dentate line consistent with focal prolapse change. Biopsies were taken with a cold forceps for histology to ensure no adenomatous changes. - Internal hemorrhoids were found during retroflexion. - The exam was otherwise without abnormality. 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.   EGD 08/06/20  - A 2 cm hiatal hernia was present. - The exam of the esophagus was otherwise normal. - Multiple small benign appearing sessile polyps were found in the gastric fundus and in the gastric body. Biopsies were taken with a cold forceps for histology and a few of them were removed as representative sample/ - Diffuse atrophic mucosa was found in the  entire examined stomach. Biopsies were taken from antrum / body with a cold forceps for Helicobacter pylori testing and to rule out GIM. - The exam of the stomach was otherwise normal. - The duodenal bulb and second portion of the duodenum were normal. Biopsies for histology were taken with a cold forceps for evaluation of celiac disease.   1. Surgical [P], duodenal bulb, 2nd portion of duodenum and distal duodenum - DUODENAL MUCOSA WITH NO SIGNIFICANT PATHOLOGIC FINDINGS. - NEGATIVE FOR  INCREASED INTRAEPITHELIAL LYMPHOCYTES AND VILLOUS ARCHITECTURAL CHANGES. 2. Surgical [P], gastric antrum and gastric body - GASTRIC ANTRAL MUCOSA WITH MILD REACTIVE GASTROPATHY. 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 - WARTHIN-STARRY STAIN IS NEGATIVE FOR HELICOBACTER PYLORI. 3. Surgical [P], gastric polyps - FUNDIC GLAND POLYPS. 4. Surgical [P], random colon bx - COLONIC MUCOSA WITH NO SIGNIFICANT PATHOLOGIC FINDINGS. - NEGATIVE FOR ACTIVE INFLAMMATION AND OTHER ABNORMALITIES. 5. Surgical [P], colon, rectum - ULCER. - NEGATIVE FOR DYSPLASIA.   CT scan 02/02/21 - Abdomen / pelvis with contrast - IMPRESSION: 1. No acute CT findings of the abdomen or pelvis to explain abdominal pain. 2. Moderate right, small left pleural effusions and associated atelectasis or consolidation, separately reported on examination of the chest. 3. Aortic atherosclerosis.   Echocardiogram 07/03/21 - EF 40-45%   GI pathogen panel - 08/06/21 - Enteroaggregative/pathogenic E coli -      Past Medical History:  Diagnosis Date   Arthritis    Atypical chest pain    Diverticulosis    Gastritis 11/08/2007, 04/12/2012   H pylori bx neg 03/2012   GERD (gastroesophageal reflux disease) 11/08/2007, 04/12/2012   Hepatic cyst    Hiatal hernia 11/08/2007, 04/12/2012   History of kidney stones    Hyperlipidemia    Hypertension    IBS (irritable bowel syndrome)    diarrhea predom   Migraine    Mitral regurgitation    Osteoporosis    DEXA 01/21/2012: -2.1 spine and R fem, -1.8 L fem s/p fosamax  2004-2011 DEXA 04/18/14 @ [facility]: -2.5 - rec to start Prolia     PVC's (premature ventricular contractions)    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   Complex valvuloplasty including PTFE neochord placement x6 with 30 mm Medtronic Simuform ring annuloplasty with clipping of LA appendage   Schatzki's ring 11/11/2010   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   Shingles outbreak 04/2013     Past  Surgical History:  Procedure Laterality Date   ANTERIOR CERVICAL DECOMP/DISCECTOMY FUSION N/A 05/06/2022   Procedure: CERVICAL THREE-FOUR, CERVICAL FOUR-FIVE ANTERIOR CERVICAL DECOMPRESSION/DISCECTOMY FUSION;  Surgeon: Julio Sicks, MD;  Location: [facility] OR;  Service: Neurosurgery;  Laterality: N/A;   APPENDECTOMY  1958   BUBBLE STUDY  03/14/2021   Procedure: BUBBLE STUDY;  Surgeon: Quintella Reichert, MD;  Location: [facility] ENDOSCOPY;  Service: Cardiovascular;;   CARDIAC CATHETERIZATION     CLIPPING OF ATRIAL APPENDAGE N/A 05/21/2021   Procedure: CLIPPING OF ATRIAL APPENDAGE USING ATRICURE PRO2 CLIP;  Surgeon: Purcell Nails, MD;  Location: [facility] OR;  Service: Open Heart Surgery;  Laterality: N/A;   COLONOSCOPY     CYSTOCELE REPAIR  2008   ESOPHAGOGASTRODUODENOSCOPY     Maxilofacial  1992   MITRAL VALVE REPAIR  05/21/2021   Procedure: MINIMALLY INVASIVE MITRAL VALVE REPAIR (MVR) USING MEDTRONIC SIMUFORM RING;  Surgeon: Purcell Nails, MD;  Location: [facility] OR;  Service: Open Heart Surgery;;   RIGHT/LEFT HEART CATH AND CORONARY ANGIOGRAPHY N/A 03/14/2021   Procedure: RIGHT/LEFT HEART CATH  AND CORONARY ANGIOGRAPHY;  Surgeon: Runell Gess, MD;  Location: [facility] INVASIVE CV LAB;  Service: Cardiovascular;  Laterality: N/A;   TEE WITHOUT CARDIOVERSION N/A 03/14/2021   Procedure: TRANSESOPHAGEAL ECHOCARDIOGRAM (TEE);  Surgeon: Quintella Reichert, MD;  Location: [facility] ENDOSCOPY;  Service: Cardiovascular;  Laterality: N/A;   TEE WITHOUT CARDIOVERSION N/A 05/21/2021   Procedure: TRANSESOPHAGEAL ECHOCARDIOGRAM (TEE);  Surgeon: Purcell Nails, MD;  Location: [facility] OR;  Service: Open Heart Surgery;  Laterality: N/A;   TONSILLECTOMY  1960   Family History  Problem Relation Age of Onset   Hypertension Mother    Alzheimer's disease Mother 58   AAA (abdominal aortic aneurysm) Mother    Stroke Father 30   Kidney cancer Brother        mets   Bone cancer Brother 36   Colon cancer Neg Hx    Social History    Tobacco Use   Smoking status: Never   Smokeless tobacco: Never  Vaping Use   Vaping Use: Never used  Substance Use Topics   Alcohol use: No   Drug use: No   Current Outpatient Medications  Medication Sig Dispense Refill   acetaminophen (TYLENOL) 650 MG CR tablet Take 650 mg by mouth every 8 (eight) hours as needed for pain (Headache).     amLODipine (NORVASC) 5 MG tablet Take 1 tablet (5 mg total) by mouth daily. 180 tablet 0   aspirin EC 81 MG EC tablet Take 1 tablet (81 mg total) by mouth daily. Swallow whole. 30 tablet 11   atorvastatin (LIPITOR) 40 MG tablet TAKE ONE TABLET BY MOUTH ONCE DAILY 90 tablet 3   carbamide peroxide (DEBROX) 6.5 % OTIC solution Place 5 drops into the left ear 2 (two) times daily. 15 mL 0   cloNIDine (CATAPRES) 0.1 MG tablet Take 1 tablet (0.1 mg total) by mouth daily as needed. If blood pressure 160/90 or higher. 30 tablet 1   Cyanocobalamin (VITAMIN B-12) 2500 MCG SUBL Place 2,500 mcg under the tongue daily.     dicyclomine (BENTYL) 10 MG capsule Take 1 capsule (10 mg total) by mouth 2 (two) times daily. 60 capsule 3   gabapentin (NEURONTIN) 100 MG capsule Take 1 capsule (100 mg total) by mouth at bedtime. 30 capsule 3   metoprolol succinate (TOPROL-XL) 25 MG 24 hr tablet TAKE ONE TABLET BY MOUTH EVERY MORNING and TAKE ONE TABLET BY MOUTH EVERY EVENING (Patient taking differently: TAKE ONE TABLET BY MOUTH EVERY MORNING and TAKE ONE TABLET BY MOUTH EVERY NIGHT) 180 tablet 2   Peppermint Oil (IBGARD) 90 MG CPCR Use as needed (Patient taking differently: Take 90 mg by mouth daily as needed (Stomach control). Use as needed)     Polyethyl Glycol-Propyl Glycol (SYSTANE) 0.4-0.3 % SOLN Place 1 drop into both eyes 2 (two) times daily.     traMADol (ULTRAM) 50 MG tablet Take 0.5-1 tablets (25-50 mg total) by mouth every 12 (twelve) hours as needed. 30 tablet 0   warfarin (COUMADIN) 2 MG tablet Take ONE TO 1 & 1/2 tablets BY MOUTH ONCE DAILY OR  AS DIRECTED BY  COUMADIN CLINIC 100 tablet 0   No current facility-administered medications for this visit.   No Known Allergies   Review of Systems: All systems reviewed and negative except where noted in HPI.   Lab Results  Component Value Date   WBC 8.3 01/22/2023   HGB 14.5 01/22/2023   HCT 42.9 01/22/2023   MCV 98.4 01/22/2023   PLT 220.0 01/22/2023  Lab Results  Component Value Date   CREATININE 0.88 01/22/2023   BUN 30 (H) 01/22/2023   NA 140 01/22/2023   K 4.0 01/22/2023   CL 105 01/22/2023   CO2 24 01/22/2023    Lab Results  Component Value Date   ALT 22 01/22/2023   AST 22 01/22/2023   ALKPHOS 38 (L) 01/22/2023   BILITOT 0.7 01/22/2023    Physical Exam: BP 128/68 (BP Location: Left Arm, Patient Position: Sitting, Cuff Size: Normal)   Pulse 72   Ht 5\' 3"  (1.6 m)   Wt 135 lb 2 oz (61.3 kg)   SpO2 92%   BMI 23.94 kg/m  Constitutional: Pleasant,well-developed, female in no acute distress. DRE - CMA Lucius Conn as standby - posterior midline anal fissure noted, skin tag - sensitivity to DRE in light of fissure, full exam with index finger not done due to sensitivity from fissure Neurological: Alert and oriented to person place and time. Skin: Skin is warm and dry. No rashes noted. Psychiatric: Normal mood and affect. Behavior is normal.   ASSESSMENT: 77 y.o. female here for assessment of the following  1. Gastroesophageal reflux disease, unspecified whether esophagitis present   2. Dysphagia, unspecified type   3. Rectal bleeding   4. Anal fissure   5. Altered bowel habits    We discussed her reflux symptoms which appear to be related to some trigger foods and typically postprandial.  She has been on PPI remotely, has a history of osteoporosis, symptoms seem mild, would like to keep her off PPI if needed.  Her last EGD shows no evidence of Barrett's esophagus.  After discussion of options recommend she take Pepcid 20 mg daily and can increase to twice daily if  needed.  Hopefully this works well for her mild intermittent symptoms and can using PPI in the future.  Dysphagia sounds like it is postoperative and related to her neck surgery.  Her prior EGD showed no significant stenosis.  We discussed how much this bother her and how aggressive she wanted to be with pursuing it.  She is not interested in further evaluation but would hope to avoid EGD if possible.  We will do a barium swallow with tablet to get a sense of motility and make sure no high-grade or significant stenosis.  She is agreeable, otherwise reassured her on findings of last exam.  If symptoms progress she may need a dilation.  Regarding her rectal bleeding, I think this is due to anal fissure noted on exam today.  Recommend topical diltiazem/lidocaine ointment, pea-sized amount PR 3 times daily, she is agreeable for this.  Regarding her bowel habits, recommend Citrucel once daily to provide some regularity and keep stools soft.  Recommend against Metamucil in light of her bloating and gas.  She can use IBgard and Bentyl in the interim.  I gave her additional samples of IBgard.  Reassured her of EGD and colonoscopy results otherwise.  She can follow-up as needed if symptoms persist or fail to improve.   PLAN: - start pepcid 20mg  q day PRN q day to BID - holding off on PPI given mild symptoms and osteoporosis - barium swallow with tablet - reassured them, hopefully EGD not needed but if symptoms progress may need a dilation - stop metamucil in light of bloating - start Citrucel once daily - continue IB gard PRN - samples given - topical diltiazem / lidocaine ointment - pea sized amount PR TID for fissure - recent colonoscopy / EGD  and labs reassuring - f/u PRN  Harlin Rain, MD [facility]astro[facility]

## 2023-03-10 NOTE — Patient Instructions (Addendum)
_______________________________________________________  If your blood pressure at your visit was 140/90 or greater, please contact your primary care physician to follow up on this.  _______________________________________________________  If you are age 78 or older, your body mass index should be between 23-30. Your Body mass index is 23.94 kg/m. If this is out of the aforementioned range listed, please consider follow up with your Primary Care Provider.  If you are age 59 or younger, your body mass index should be between 19-25. Your Body mass index is 23.94 kg/m. If this is out of the aformentioned range listed, please consider follow up with your Primary Care Provider.   ________________________________________________________  The [facility] providers would like to encourage you to use [facility] to communicate with providers for non-urgent requests or questions.  Due to long hold times on the telephone, sending your provider a message by [facility] may be a faster and more efficient way to get a response.  Please allow 48 business hours for a response.  Please remember that this is for non-urgent requests.  _______________________________________________________   Bonita Quin have been scheduled for a Barium Esophogram at [facility] (1st floor of the hospital) on Friday, 03-20-23 at 10:00am. Please arrive 15 minutes prior to your appointment for registration. Make certain not to have anything to eat or drink 3 hours prior to your test. If you need to reschedule for any reason, please contact radiology at [phone] to do so. __________________________________________________________________ A barium swallow is an examination that concentrates on views of the esophagus. This tends to be a double contrast exam (barium and two liquids which, when combined, create a gas to distend the wall of the oesophagus) or single contrast (non-ionic iodine based). The study is usually tailored to your  symptoms so a good history is essential. Attention is paid during the study to the form, structure and configuration of the esophagus, looking for functional disorders (such as aspiration, dysphagia, achalasia, motility and reflux) EXAMINATION You may be asked to change into a gown, depending on the type of swallow being performed. A radiologist and radiographer will perform the procedure. The radiologist will advise you of the type of contrast selected for your procedure and direct you during the exam. You will be asked to stand, sit or lie in several different positions and to hold a small amount of fluid in your mouth before being asked to swallow while the imaging is performed .In some instances you may be asked to swallow barium coated marshmallows to assess the motility of a solid food bolus. The exam can be recorded as a digital or video fluoroscopy procedure. POST PROCEDURE It will take 1-2 days for the barium to pass through your system. To facilitate this, it is important, unless otherwise directed, to increase your fluids for the next 24-48hrs and to resume your normal diet.  This test typically takes about 30 minutes to perform. ________________________________________________________________________  We have sent the following medications to your pharmacy for you to pick up at your convenience:  Pepcid: Take once daily _______________________________________________________________ We have sent a prescription for Diltiazem gel to [facility] You should apply a pea size amount to your rectum three times daily x 6-8 weeks.  [facility]'s information is below: Address: [address], [place], [place] [address]  [phone]  *Please DO NOT go directly from our office to pick up this medication! Give the pharmacy 1 day to process the prescription as this is compounded and takes time to  make. _________________________________________________________________  Stop Metamucil  Start Citrucel, over-the-counter once daily  We have given you samples of the following medication to take: IBgard - take as needed  Thank you for entrusting me with your care and for choosing [facility], Dr. Ileene Patrick

## 2023-03-16 DIAGNOSIS — M67911 Unspecified disorder of synovium and tendon, right shoulder: Secondary | ICD-10-CM | POA: Diagnosis not present

## 2023-03-17 DIAGNOSIS — M79641 Pain in right hand: Secondary | ICD-10-CM | POA: Diagnosis not present

## 2023-03-17 DIAGNOSIS — M65342 Trigger finger, left ring finger: Secondary | ICD-10-CM | POA: Insufficient documentation

## 2023-03-17 DIAGNOSIS — M65321 Trigger finger, right index finger: Secondary | ICD-10-CM | POA: Diagnosis not present

## 2023-03-17 DIAGNOSIS — M79642 Pain in left hand: Secondary | ICD-10-CM | POA: Diagnosis not present

## 2023-03-17 DIAGNOSIS — M65311 Trigger thumb, right thumb: Secondary | ICD-10-CM | POA: Diagnosis not present

## 2023-03-20 ENCOUNTER — Ambulatory Visit (HOSPITAL_COMMUNITY)
Admission: RE | Admit: 2023-03-20 | Discharge: 2023-03-20 | Disposition: A | Discharge status: Home / Self Care | Payer: Medicare HMO | Source: Ambulatory Visit | Attending: Gastroenterology | Admitting: Gastroenterology

## 2023-03-20 DIAGNOSIS — K625 Hemorrhage of anus and rectum: Secondary | ICD-10-CM | POA: Insufficient documentation

## 2023-03-20 DIAGNOSIS — K219 Gastro-esophageal reflux disease without esophagitis: Secondary | ICD-10-CM | POA: Diagnosis not present

## 2023-03-20 DIAGNOSIS — K602 Anal fissure, unspecified: Secondary | ICD-10-CM | POA: Diagnosis not present

## 2023-03-20 DIAGNOSIS — R194 Change in bowel habit: Secondary | ICD-10-CM

## 2023-03-20 DIAGNOSIS — R1310 Dysphagia, unspecified: Secondary | ICD-10-CM

## 2023-03-20 DIAGNOSIS — K449 Diaphragmatic hernia without obstruction or gangrene: Secondary | ICD-10-CM | POA: Diagnosis not present

## 2023-03-21 DIAGNOSIS — M25511 Pain in right shoulder: Secondary | ICD-10-CM | POA: Diagnosis not present

## 2023-03-24 ENCOUNTER — Other Ambulatory Visit: Payer: Self-pay | Admitting: Family Medicine

## 2023-03-24 ENCOUNTER — Ambulatory Visit: Payer: Medicare HMO | Attending: Cardiovascular Disease | Admitting: *Deleted

## 2023-03-24 ENCOUNTER — Encounter: Payer: Self-pay | Admitting: Family Medicine

## 2023-03-24 DIAGNOSIS — Z9889 Other specified postprocedural states: Secondary | ICD-10-CM

## 2023-03-24 DIAGNOSIS — G4486 Cervicogenic headache: Secondary | ICD-10-CM

## 2023-03-24 DIAGNOSIS — I340 Nonrheumatic mitral (valve) insufficiency: Secondary | ICD-10-CM

## 2023-03-24 DIAGNOSIS — Z7901 Long term (current) use of anticoagulants: Secondary | ICD-10-CM

## 2023-03-24 LAB — POCT INR: INR: 2.8 (ref 2.0–3.0)

## 2023-03-24 NOTE — Patient Instructions (Addendum)
Description   Continue taking warfarin 1.5 tablets daily except for 1 tablet on Wednesday. Recheck INR in 5 weeks. [facility] [phone]; Procedure Fax Number [phone] or [phone]

## 2023-03-30 DIAGNOSIS — M67911 Unspecified disorder of synovium and tendon, right shoulder: Secondary | ICD-10-CM | POA: Diagnosis not present

## 2023-04-01 ENCOUNTER — Telehealth: Payer: Self-pay | Admitting: Family Medicine

## 2023-04-01 NOTE — Progress Notes (Signed)
[name] [address] [place] [address] Phone: [phone] Subjective:   [name] [name], am serving as a scribe for Dr. Antoine Primas.  I'm seeing this patient by the request  of:  [place], Betty G, MD  CC: Hand pain, low back pain  ONG:[id number]  01/22/2023 Patient on exam today he did not have any worsening pain with Tinel's.  Discussed that we should consider the possibility of further evaluation of this, will refer to hand surgery with patient wanting that at this moment.     Patient given injection and tolerated the procedure well, discussed bracing at night, will refer to orthopedic surgery hand surgeon to discuss surgical management. Affecting daily activities on a regular basis.   Discussed with patient at great length.  Patient does have some signs and symptoms consistent with possible autoimmune disease.  We have discussed the possibility of referral to rheumatology.  Will get labs first to see if anything is potentially positive such as ANA or any other autoimmune labs.  Will get sedimentation rate as well.  Uric acid vitamins to see if anything needs to be supplemented.  Follow-up again in 6 to 8 weeks did discuss with the patient and her spouse that I am able to talk to their son who is a doctor if needed.     Chronic problem with recent exacerbation. Has trigger points of multiple other fingers as well we will need to continue to monitor. Concerned the patient could be having early Dupuytren's. Patient has had multiple injections previously but will refer patient to discuss the possibility of surgical intervention. Follow-up with me as needed for this problem.   Updated 04/02/2023 [name] [name] is a 77 y.o. female coming in with complaint of trigger finger and polyarthralgia. Patient saw Dr. Frazier Butt for trigger fingers who recommends surgery.   Had injection one week ago in R shoulder.   Lower back pain that is constant. Painful  to turn and bend forward. Tylenol and voltaren for pain relief.       Past Medical History:  Diagnosis Date   Arthritis    Atypical chest pain    Diverticulosis    Gastritis 11/08/2007, 04/12/2012   H pylori bx neg 03/2012   GERD (gastroesophageal reflux disease) 11/08/2007, 04/12/2012   Hepatic cyst    Hiatal hernia 11/08/2007, 04/12/2012   History of kidney stones    Hyperlipidemia    Hypertension    IBS (irritable bowel syndrome)    diarrhea predom   Migraine    Mitral regurgitation    Osteoporosis    DEXA 01/21/2012: -2.1 spine and R fem, -1.8 L fem s/p fosamax  2004-2011 DEXA 04/18/14 @ [facility]: -2.5 - rec to start Prolia     PVC's (premature ventricular contractions)    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   Complex valvuloplasty including PTFE neochord placement x6 with 30 mm Medtronic Simuform ring annuloplasty with clipping of LA appendage   Schatzki's ring 11/11/2010   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   Shingles outbreak 04/2013   Past Surgical History:  Procedure Laterality Date   ANTERIOR CERVICAL DECOMP/DISCECTOMY FUSION N/A 05/06/2022   Procedure: CERVICAL THREE-FOUR, CERVICAL FOUR-FIVE ANTERIOR CERVICAL DECOMPRESSION/DISCECTOMY FUSION;  Surgeon: Julio Sicks, MD;  Location: [facility] OR;  Service: Neurosurgery;  Laterality: N/A;   APPENDECTOMY  1958   BUBBLE STUDY  03/14/2021   Procedure: BUBBLE STUDY;  Surgeon: Quintella Reichert, MD;  Location: [facility] ENDOSCOPY;  Service: Cardiovascular;;   CARDIAC  CATHETERIZATION     CLIPPING OF ATRIAL APPENDAGE N/A 05/21/2021   Procedure: CLIPPING OF ATRIAL APPENDAGE USING ATRICURE PRO2 CLIP;  Surgeon: Purcell Nails, MD;  Location: [facility] OR;  Service: Open Heart Surgery;  Laterality: N/A;   COLONOSCOPY     CYSTOCELE REPAIR  2008   ESOPHAGOGASTRODUODENOSCOPY     Maxilofacial  1992   MITRAL VALVE REPAIR  05/21/2021   Procedure: MINIMALLY INVASIVE MITRAL VALVE REPAIR (MVR) USING MEDTRONIC SIMUFORM RING;  Surgeon:  Purcell Nails, MD;  Location: [facility] OR;  Service: Open Heart Surgery;;   RIGHT/LEFT HEART CATH AND CORONARY ANGIOGRAPHY N/A 03/14/2021   Procedure: RIGHT/LEFT HEART CATH AND CORONARY ANGIOGRAPHY;  Surgeon: Runell Gess, MD;  Location: [facility] INVASIVE CV LAB;  Service: Cardiovascular;  Laterality: N/A;   TEE WITHOUT CARDIOVERSION N/A 03/14/2021   Procedure: TRANSESOPHAGEAL ECHOCARDIOGRAM (TEE);  Surgeon: Quintella Reichert, MD;  Location: [facility] ENDOSCOPY;  Service: Cardiovascular;  Laterality: N/A;   TEE WITHOUT CARDIOVERSION N/A 05/21/2021   Procedure: TRANSESOPHAGEAL ECHOCARDIOGRAM (TEE);  Surgeon: Purcell Nails, MD;  Location: [facility] OR;  Service: Open Heart Surgery;  Laterality: N/A;   TONSILLECTOMY  1960   Social History   Socioeconomic History   Marital status: Married    Spouse name: Madaline Guthrie   Number of children: 3   Years of education: Not on file   Highest education level: Master's degree (e.g., MA, MS, MEng, MEd, MSW, MBA)  Occupational History   Occupation: retired  Tobacco Use   Smoking status: Never   Smokeless tobacco: Never  Vaping Use   Vaping Use: Never used  Substance and Sexual Activity   Alcohol use: No   Drug use: No   Sexual activity: Not on file  Other Topics Concern   Not on file  Social History Narrative   Married, lives with spouse. Retired Comptroller, Manufacturing systems engineer. Has 3 kids (moved to [place] to be closer)- youngest son MD. Linton Ham to [place] from [place] 06/2010, lived in [place] x 10years   Social Determinants of Health   Financial Resource Strain: Low Risk  (02/06/2022)   Overall Financial Resource Strain (CARDIA)    Difficulty of Paying Living Expenses: Not hard at all  Food Insecurity: No Food Insecurity (02/06/2022)   Hunger Vital Sign    Worried About Running Out of Food in the Last Year: Never true    Ran Out of Food in the Last Year: Never true  Transportation Needs: No Transportation Needs (02/06/2022)   PRAPARE - Administrator, Civil Service  (Medical): No    Lack of Transportation (Non-Medical): No  Physical Activity: Inactive (02/06/2022)   Exercise Vital Sign    Days of Exercise per Week: 0 days    Minutes of Exercise per Session: 0 min  Stress: No Stress Concern Present (02/06/2022)   [employer] of Occupational Health - Occupational Stress Questionnaire    Feeling of Stress : Not at all  Social Connections: Socially Integrated (02/06/2022)   Social Connection and Isolation Panel [NHANES]    Frequency of Communication with Friends and Family: More than three times a week    Frequency of Social Gatherings with Friends and Family: More than three times a week    Attends Religious Services: More than 4 times per year    Active Member of [employer] or Organizations: Yes    Attends Engineer, structural: More than 4 times per year    Marital Status: Married   No Known Allergies  Family History  Problem Relation Age of Onset   Hypertension Mother    Alzheimer's disease Mother 51   AAA (abdominal aortic aneurysm) Mother    Stroke Father 43   Kidney cancer Brother        mets   Bone cancer Brother 76   Colon cancer Neg Hx      Current Outpatient Medications (Cardiovascular):    amLODipine (NORVASC) 5 MG tablet, Take 1 tablet (5 mg total) by mouth daily.   atorvastatin (LIPITOR) 40 MG tablet, TAKE ONE TABLET BY MOUTH ONCE DAILY   cloNIDine (CATAPRES) 0.1 MG tablet, Take 1 tablet (0.1 mg total) by mouth daily as needed. If blood pressure 160/90 or higher.   diltiazem 2 % GEL*, Apply 1 Application topically 3 (three) times daily. Apply a pea sized amount internally (rectally) three times daily.   metoprolol succinate (TOPROL-XL) 25 MG 24 hr tablet, TAKE ONE TABLET BY MOUTH EVERY MORNING and TAKE ONE TABLET BY MOUTH EVERY EVENING (Patient taking differently: TAKE ONE TABLET BY MOUTH EVERY MORNING and TAKE ONE TABLET BY MOUTH EVERY NIGHT)   Current Outpatient Medications (Analgesics):    acetaminophen (TYLENOL) 650 MG  CR tablet, Take 650 mg by mouth every 8 (eight) hours as needed for pain (Headache).   aspirin EC 81 MG EC tablet, Take 1 tablet (81 mg total) by mouth daily. Swallow whole.   traMADol (ULTRAM) 50 MG tablet, Take 0.5-1 tablets (25-50 mg total) by mouth every 12 (twelve) hours as needed.  Current Outpatient Medications (Hematological):    Cyanocobalamin (VITAMIN B-12) 2500 MCG SUBL, Place 2,500 mcg under the tongue daily.   warfarin (COUMADIN) 2 MG tablet, Take ONE TO 1 & 1/2 tablets BY MOUTH ONCE DAILY OR  AS DIRECTED BY COUMADIN CLINIC  Current Outpatient Medications (Other):    carbamide peroxide (DEBROX) 6.5 % OTIC solution, Place 5 drops into the left ear 2 (two) times daily.   dicyclomine (BENTYL) 10 MG capsule, Take 1 capsule (10 mg total) by mouth 2 (two) times daily.   diltiazem 2 % GEL*, Apply 1 Application topically 3 (three) times daily. Apply a pea sized amount internally (rectally) three times daily.   famotidine (PEPCID) 20 MG tablet, Take 1 tablet (20 mg total) by mouth daily.   gabapentin (NEURONTIN) 100 MG capsule, TAKE ONE CAPSULE BY MOUTH EVERYDAY AT BEDTIME   methylcellulose (CITRUCEL) oral powder, Take 1 packet by mouth daily.   Peppermint Oil (IBGARD) 90 MG CPCR, Use as needed (Patient taking differently: Take 90 mg by mouth daily as needed (Stomach control). Use as needed)   Polyethyl Glycol-Propyl Glycol (SYSTANE) 0.4-0.3 % SOLN, Place 1 drop into both eyes 2 (two) times daily. * These medications belong to multiple therapeutic classes and are listed under each applicable group.   Reviewed prior external information including notes and imaging from  primary care provider As well as notes that were available from care everywhere and other healthcare systems.  Past medical history, social, surgical and family history all reviewed in electronic medical record.  No pertanent information unless stated regarding to the chief complaint.   Review of Systems:  No headache,  visual changes, nausea, vomiting, diarrhea, constipation, dizziness, abdominal pain, skin rash, fevers, chills, night sweats, weight loss, swollen lymph nodes, body aches, joint swelling, chest pain, shortness of breath, mood changes. POSITIVE muscle aches  Objective  Blood pressure 122/80, pulse 69, height 5\' 3"  (1.6 m), weight 136 lb (61.7 kg), SpO2 96 %.   General: No  apparent distress alert and oriented x3 mood and affect normal, dressed appropriately. Patient is frail   HEENT: Pupils equal, extraocular movements intact  Respiratory: Patient's speak in full sentences and does not appear short of breath  Cardiovascular: No lower extremity edema, non tender, no erythema  Hand exam is bilaterally do show that patient has some thenar eminence wasting noted.  Patient's right thumb does have a trigger nodule noted at the A1 pulley.  Patient's left ring finger has 1 at the A2 pulley.  Tender to palpation in both areas.  Low back exam does show the patient does have some loss of lordosis noted.  Tenderness to palpation in the midline.  Patient does have positive straight leg on the right side.  Radicular symptoms noted.  This is at 20 degrees of forward flexion.  4-5 strength in the dorsiflexion on the right foot  Procedure: Real-time Ultrasound Guided Injection of right thumb flexor tendon sheath Device: GE Logiq Q7 Ultrasound guided injection is preferred based studies that show increased duration, increased effect, greater accuracy, decreased procedural pain, increased response rate, and decreased cost with ultrasound guided versus blind injection.  Verbal informed consent obtained.  Time-out conducted.  Noted no overlying erythema, induration, or other signs of local infection.  Skin prepped in a sterile fashion.  Local anesthesia: Topical Ethyl chloride.  With sterile technique and under real time ultrasound guidance: With a 25-gauge half inch needle injected with 0.5 cc of 0.5% Marcaine and  0.5 cc of Kenalog 40 mg/mL Completed without difficulty  Pain immediately resolved suggesting accurate placement of the medication.  Advised to call if fevers/chills, erythema, induration, drainage, or persistent bleeding.  Impression: Technically successful ultrasound guided injection.  Procedure: Real-time Ultrasound Guided Injection of left ring flexor tendon sheath Device: GE Logiq Q7 Ultrasound guided injection is preferred based studies that show increased duration, increased effect, greater accuracy, decreased procedural pain, increased response rate, and decreased cost with ultrasound guided versus blind injection.  Verbal informed consent obtained.  Time-out conducted.  Noted no overlying erythema, induration, or other signs of local infection.  Skin prepped in a sterile fashion.  Local anesthesia: Topical Ethyl chloride.  With sterile technique and under real time ultrasound guidance: With a 25-gauge half inch needle injected with 0.5 cc of 0.5% Marcaine and 0.5 cc of Kenalog 40 mg/mL Completed without difficulty  Pain immediately resolved suggesting accurate placement of the medication.  Advised to call if fevers/chills, erythema, induration, drainage, or persistent bleeding.  Impression: Technically successful ultrasound guided injection.    Impression and Recommendations:    The above documentation has been reviewed and is accurate and complete Judi Saa, DO

## 2023-04-01 NOTE — Telephone Encounter (Signed)
Contacted [name] [name] to schedule their annual wellness visit. Appointment made for 04/08/23.  [name] [name] AWV direct phone # [phone]*

## 2023-04-02 ENCOUNTER — Other Ambulatory Visit: Payer: Self-pay

## 2023-04-02 ENCOUNTER — Ambulatory Visit (INDEPENDENT_AMBULATORY_CARE_PROVIDER_SITE_OTHER): Payer: Medicare HMO

## 2023-04-02 ENCOUNTER — Encounter: Payer: Self-pay | Admitting: Family Medicine

## 2023-04-02 ENCOUNTER — Ambulatory Visit (INDEPENDENT_AMBULATORY_CARE_PROVIDER_SITE_OTHER): Payer: Medicare HMO | Admitting: Family Medicine

## 2023-04-02 VITALS — BP 122/80 | HR 69 | Ht 63.0 in | Wt 136.0 lb

## 2023-04-02 DIAGNOSIS — M65311 Trigger thumb, right thumb: Secondary | ICD-10-CM

## 2023-04-02 DIAGNOSIS — M79641 Pain in right hand: Secondary | ICD-10-CM | POA: Diagnosis not present

## 2023-04-02 DIAGNOSIS — M545 Low back pain, unspecified: Secondary | ICD-10-CM | POA: Diagnosis not present

## 2023-04-02 DIAGNOSIS — M65342 Trigger finger, left ring finger: Secondary | ICD-10-CM | POA: Diagnosis not present

## 2023-04-02 DIAGNOSIS — G8929 Other chronic pain: Secondary | ICD-10-CM

## 2023-04-02 NOTE — Patient Instructions (Addendum)
Injected both hands today Xray lumbar MRI lumbar [id number] See me again in 10 weeks

## 2023-04-02 NOTE — Assessment & Plan Note (Signed)
Chronic problem with worsening symptoms.  Repeat injection given again today.  Patient will do bracing at night if necessary.  Wants to hold on any type of surgical intervention but has discussed with hand surgeon.  Follow-up with me again 10 to 12 weeks

## 2023-04-02 NOTE — Assessment & Plan Note (Signed)
Patient is now having more midline pain.  Concern for potential compression fracture with midline discomfort.  In addition to this though patient is also now having radicular symptoms with weakness of the right foot.  Do feel an MRI would be beneficial.  Depending on findings we will see if patient is a candidate for the possibility of epidural steroid injections versus possibility of other treatment and allergies.  Patient will follow-up after imaging to discuss further

## 2023-04-02 NOTE — Assessment & Plan Note (Signed)
Repeated the A1 pulley not at the A2 pulley.  This would hopefully be more beneficial and likely contributing to some of the triggering noted at this time.  Discussed icing regimen and home exercises otherwise.  Increase activity slowly.  Follow-up again in 6 to 8 weeks

## 2023-04-14 ENCOUNTER — Encounter: Payer: Self-pay | Admitting: Cardiovascular Disease

## 2023-04-17 NOTE — Progress Notes (Signed)
HPI: [name] is a 77 y.o. female, who is here today for chronic disease management.  Last seen on 12/22/22. Since her last visit she has follow-up with Dr. Katrinka Blazing, sports medicine, and with Coumadin clinic. She has also seen Sr Smith for trigger fingers, underwent local steroid injection, which helped with some but not all. According to pt it was suggested to consider surgical treatment but she is reluctant to do so. She also experiences shoulders,neck, and lower back pain. She follows with neurosurgeon for cervical pain.  She is on Tramadol 50 mg, which she takes 1/2 tab at bedtime as needed. Medication helps with pain, no side effects reported. She states that lumbar MRI was recommended but has not been able to proceed with imaging because hx of valve replacement. Her cardiologist has cleared procedure but according to pt, radiology is inquiring about type of valve and material because of the risk of damaging the imaging equipment.  Chronic occipital headache, she has not seen her neurologist in a while. She is on Gabapentin 100 mg at night, which is still helping. No new associated symptoms.  HTN: She is on Amlodipine 5 mg daily and Metoprolol succinate 25 mg daily. She does not monitor  Negative for visual changes, exertional chest pain, dyspnea,  focal weakness,orthopnea,or PND.  Lab Results  Component Value Date   CREATININE 0.88 01/22/2023   BUN 30 (H) 01/22/2023   NA 140 01/22/2023   K 4.0 01/22/2023   CL 105 01/22/2023   CO2 24 01/22/2023   Bruising like areas on LE's. No significant edema or pain. Varicose veins. She has not noted numbness or tingling.  Review of Systems  Constitutional:  Negative for chills and fever.  Respiratory:  Negative for cough and wheezing.   Gastrointestinal:  Negative for abdominal pain, nausea and vomiting.  Endocrine: Negative for cold intolerance and heat intolerance.  Genitourinary:  Negative for decreased urine volume,  dysuria and hematuria.  Musculoskeletal:  Positive for arthralgias and back pain. Negative for gait problem.  Neurological:  Negative for syncope and facial asymmetry.  See other pertinent positives and negatives in HPI.  Current Outpatient Medications on File Prior to Visit  Medication Sig Dispense Refill   acetaminophen (TYLENOL) 650 MG CR tablet Take 650 mg by mouth every 8 (eight) hours as needed for pain (Headache).     amLODipine (NORVASC) 5 MG tablet Take 1 tablet (5 mg total) by mouth daily. 180 tablet 0   aspirin EC 81 MG EC tablet Take 1 tablet (81 mg total) by mouth daily. Swallow whole. 30 tablet 11   atorvastatin (LIPITOR) 40 MG tablet TAKE ONE TABLET BY MOUTH ONCE DAILY 90 tablet 3   carbamide peroxide (DEBROX) 6.5 % OTIC solution Place 5 drops into the left ear 2 (two) times daily. 15 mL 0   cloNIDine (CATAPRES) 0.1 MG tablet Take 1 tablet (0.1 mg total) by mouth daily as needed. If blood pressure 160/90 or higher. 30 tablet 1   Cyanocobalamin (VITAMIN B-12) 2500 MCG SUBL Place 2,500 mcg under the tongue daily.     dicyclomine (BENTYL) 10 MG capsule Take 1 capsule (10 mg total) by mouth 2 (two) times daily. 60 capsule 3   diltiazem 2 % GEL Apply 1 Application topically 3 (three) times daily. Apply a pea sized amount internally (rectally) three times daily. 30 g 1   famotidine (PEPCID) 20 MG tablet Take 1 tablet (20 mg total) by mouth daily. 30 tablet 3   gabapentin (  NEURONTIN) 100 MG capsule TAKE ONE CAPSULE BY MOUTH EVERYDAY AT BEDTIME 30 capsule 3   methylcellulose (CITRUCEL) oral powder Take 1 packet by mouth daily. 30 g 1   metoprolol succinate (TOPROL-XL) 25 MG 24 hr tablet TAKE ONE TABLET BY MOUTH EVERY MORNING and TAKE ONE TABLET BY MOUTH EVERY EVENING (Patient taking differently: TAKE ONE TABLET BY MOUTH EVERY MORNING and TAKE ONE TABLET BY MOUTH EVERY NIGHT) 180 tablet 2   Peppermint Oil (IBGARD) 90 MG CPCR Use as needed (Patient taking differently: Take 90 mg by mouth  daily as needed (Stomach control). Use as needed)     Polyethyl Glycol-Propyl Glycol (SYSTANE) 0.4-0.3 % SOLN Place 1 drop into both eyes 2 (two) times daily.     traMADol (ULTRAM) 50 MG tablet Take 0.5-1 tablets (25-50 mg total) by mouth every 12 (twelve) hours as needed. 30 tablet 0   warfarin (COUMADIN) 2 MG tablet Take ONE TO 1 & 1/2 tablets BY MOUTH ONCE DAILY OR  AS DIRECTED BY COUMADIN CLINIC 100 tablet 0   No current facility-administered medications on file prior to visit.   Past Medical History:  Diagnosis Date   Arthritis    Atypical chest pain    Diverticulosis    Gastritis 11/08/2007, 04/12/2012   H pylori bx neg 03/2012   GERD (gastroesophageal reflux disease) 11/08/2007, 04/12/2012   Hepatic cyst    Hiatal hernia 11/08/2007, 04/12/2012   History of kidney stones    Hyperlipidemia    Hypertension    IBS (irritable bowel syndrome)    diarrhea predom   Migraine    Mitral regurgitation    Osteoporosis    DEXA 01/21/2012: -2.1 spine and R fem, -1.8 L fem s/p fosamax  2004-2011 DEXA 04/18/14 @ [facility]: -2.5 - rec to start Prolia     PVC's (premature ventricular contractions)    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   Complex valvuloplasty including PTFE neochord placement x6 with 30 mm Medtronic Simuform ring annuloplasty with clipping of LA appendage   Schatzki's ring 11/11/2010   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   Shingles outbreak 04/2013   No Known Allergies  Social History   Socioeconomic History   Marital status: Married    Spouse name: [name] [name]   Number of children: 3   Years of education: Not on file   Highest education level: Master's degree (e.g., MA, MS, MEng, MEd, MSW, MBA)  Occupational History   Occupation: retired  Tobacco Use   Smoking status: Never   Smokeless tobacco: Never  Vaping Use   Vaping Use: Never used  Substance and Sexual Activity   Alcohol use: No   Drug use: No   Sexual activity: Not on file  Other Topics Concern    Not on file  Social History Narrative   Married, lives with spouse. Retired Comptroller, Manufacturing systems engineer. Has 3 kids (moved to [place] to be closer)- youngest son MD. [place] Ham to [place] from [place] 06/2010, lived in [place] x 10years   Social Determinants of Health   Financial Resource Strain: Low Risk  (02/06/2022)   Overall Financial Resource Strain (CARDIA)    Difficulty of Paying Living Expenses: Not hard at all  Food Insecurity: No Food Insecurity (02/06/2022)   Hunger Vital Sign    Worried About Running Out of Food in the Last Year: Never true    Ran Out of Food in the Last Year: Never true  Transportation Needs: No Transportation Needs (02/06/2022)   PRAPARE - Transportation  Lack of Transportation (Medical): No    Lack of Transportation (Non-Medical): No  Physical Activity: Inactive (02/06/2022)   Exercise Vital Sign    Days of Exercise per Week: 0 days    Minutes of Exercise per Session: 0 min  Stress: No Stress Concern Present (02/06/2022)   [employer] of Occupational Health - Occupational Stress Questionnaire    Feeling of Stress : Not at all  Social Connections: Socially Integrated (02/06/2022)   Social Connection and Isolation Panel [NHANES]    Frequency of Communication with Friends and Family: More than three times a week    Frequency of Social Gatherings with Friends and Family: More than three times a week    Attends Religious Services: More than 4 times per year    Active Member of [employer] or Organizations: Yes    Attends Banker Meetings: More than 4 times per year    Marital Status: Married   Vitals:   04/21/23 1131  BP: 120/80  Pulse: 70  Resp: 16  Temp: 98 F (36.7 C)  SpO2: 98%   Body mass index is 23.74 kg/m.  Physical Exam Vitals and nursing note reviewed.  Constitutional:      General: She is not in acute distress.    Appearance: She is well-developed.  HENT:     Head: Normocephalic and atraumatic.     Mouth/Throat:     Mouth: Mucous  membranes are moist.     Pharynx: Oropharynx is clear.  Eyes:     Conjunctiva/sclera: Conjunctivae normal.  Cardiovascular:     Rate and Rhythm: Normal rate. Rhythm irregular.     Pulses:          Posterior tibial pulses are 2+ on the right side and 2+ on the left side.     Heart sounds: No murmur heard.    Comments: Tortuous varicose veins LE, bilateral. Pulmonary:     Effort: Pulmonary effort is normal. No respiratory distress.     Breath sounds: Normal breath sounds.  Abdominal:     Palpations: Abdomen is soft. There is no hepatomegaly or mass.     Tenderness: There is no abdominal tenderness.  Musculoskeletal:     Right shoulder: Tenderness present. Decreased range of motion (Mild).  Lymphadenopathy:     Cervical: No cervical adenopathy.  Skin:    General: Skin is warm.     Findings: No erythema or rash.  Neurological:     General: No focal deficit present.     Mental Status: She is alert and oriented to person, place, and time.     Cranial Nerves: No cranial nerve deficit.     Gait: Gait normal.  Psychiatric:     Comments: Well groomed, good eye contact.   ASSESSMENT AND PLAN:  Ms. Lonnisha Aloise was seen today for medical management of chronic issues.  Diagnoses and all orders for this visit:  Varicose veins of both lower extremities with pain Assessment & Plan: Areas she describes as bruising like are confluent telangiectasias. She also has moderate side varicose veins. Recommend compression stocking and/or LE elevation a few times through the day. Also appropriate skin care.   Essential hypertension Assessment & Plan: BP adequately controlled. Continue  Amlodipine 5 mg daily and Metoprolol succinate 25 mg daily as well as low salt diet. Eye exam is current.   Generalized osteoarthritis of multiple sites Assessment & Plan: Follows with Dr Katrinka Blazing, sport medicine. We discussed Dx,prognosis,and treatment options. Duloxetine is an option but risk  of med  interaction with Tramadol, she is not interested in adding more medications. She can take tramadol more frequent for pain management, 1/2 tab bid prn. We discussed some side effects.  For shoulder pain, trigger fingers,and lower back pain she could consider ortho evaluation and possible surgical treatment, she needs to make a decision base on risk vs benefit (pain intensity and limitations of ADL's).   Cervicogenic headache Assessment & Plan: Problem is stable. Continue Gabapentin 100 mg daily at bedtime and Tramadol 50 mg 1/2-1 tab daily as needed. Side effects of medications discussed. PMP reviewed.   Return in about 6 months (around 10/22/2023) for chronic problems.  Aqeel Norgaard G. [place], MD  [facility]. [place] office.

## 2023-04-21 ENCOUNTER — Ambulatory Visit (INDEPENDENT_AMBULATORY_CARE_PROVIDER_SITE_OTHER): Payer: Medicare HMO | Admitting: Family Medicine

## 2023-04-21 ENCOUNTER — Encounter: Payer: Self-pay | Admitting: Family Medicine

## 2023-04-21 VITALS — BP 120/80 | HR 70 | Temp 98.0°F | Resp 16 | Ht 63.0 in | Wt 134.0 lb

## 2023-04-21 DIAGNOSIS — I10 Essential (primary) hypertension: Secondary | ICD-10-CM | POA: Diagnosis not present

## 2023-04-21 DIAGNOSIS — M159 Polyosteoarthritis, unspecified: Secondary | ICD-10-CM | POA: Diagnosis not present

## 2023-04-21 DIAGNOSIS — G4486 Cervicogenic headache: Secondary | ICD-10-CM

## 2023-04-21 DIAGNOSIS — I83813 Varicose veins of bilateral lower extremities with pain: Secondary | ICD-10-CM | POA: Diagnosis not present

## 2023-04-21 NOTE — Patient Instructions (Addendum)
A few things to remember from today's visit:  Essential hypertension  Varicose veins of both lower extremities with pain  Generalized osteoarthritis of multiple sites  Cervicogenic headache  No cambios hoy. La veo en 6 meses otra vez.  She underwent minimally invasive mitral valve repair 05/21/2021 with Medtronic ring and also had atrial appendage clipping.  Status post Mitral Valve Repair (05-21-21, 30mm Medrtonic Simuform Ring.  If you need refills for medications you take chronically, please call your pharmacy. Do not use My Chart to request refills or for acute issues that need immediate attention. If you send a my chart message, it may take a few days to be addressed, specially if I am not in the office.  Please be sure medication list is accurate. If a new problem present, please set up appointment sooner than planned today.

## 2023-04-26 NOTE — Assessment & Plan Note (Signed)
Follows with Dr Katrinka Blazing, sport medicine. We discussed Dx,prognosis,and treatment options. Duloxetine is an option but risk of med interaction with Tramadol, she is not interested in adding more medications. She can take tramadol more frequent for pain management, 1/2 tab bid prn. We discussed some side effects.  For shoulder pain, trigger fingers,and lower back pain she could consider ortho evaluation and possible surgical treatment, she needs to make a decision base on risk vs benefit (pain intensity and limitations of ADL's).

## 2023-04-26 NOTE — Assessment & Plan Note (Signed)
Areas she describes as bruising like are confluent telangiectasias. She also has moderate side varicose veins. Recommend compression stocking and/or LE elevation a few times through the day. Also appropriate skin care.

## 2023-04-26 NOTE — Assessment & Plan Note (Signed)
BP adequately controlled. Continue  Amlodipine 5 mg daily and Metoprolol succinate 25 mg daily as well as low salt diet. Eye exam is current.

## 2023-04-26 NOTE — Assessment & Plan Note (Addendum)
Problem is stable. Continue Gabapentin 100 mg daily at bedtime and Tramadol 50 mg 1/2-1 tab daily as needed. Side effects of medications discussed. PMP reviewed.

## 2023-04-28 ENCOUNTER — Ambulatory Visit: Payer: Medicare HMO | Attending: Cardiovascular Disease | Admitting: *Deleted

## 2023-04-28 DIAGNOSIS — I340 Nonrheumatic mitral (valve) insufficiency: Secondary | ICD-10-CM | POA: Diagnosis not present

## 2023-04-28 LAB — POCT INR: POC INR: 2.7

## 2023-04-28 NOTE — Patient Instructions (Signed)
Description   Continue taking warfarin 1.5 tablets daily except for 1 tablet on Wednesday. Recheck INR in 6 weeks. [facility]in Clinic [phone]; Procedure Fax Number [phone] or [phone]

## 2023-05-15 ENCOUNTER — Ambulatory Visit (INDEPENDENT_AMBULATORY_CARE_PROVIDER_SITE_OTHER): Payer: Medicare HMO

## 2023-05-15 ENCOUNTER — Telehealth: Payer: Self-pay | Admitting: Family Medicine

## 2023-05-15 VITALS — BP 122/60 | HR 67 | Temp 98.3°F | Ht 63.0 in | Wt 136.7 lb

## 2023-05-15 DIAGNOSIS — Z0000 Encounter for general adult medical examination without abnormal findings: Secondary | ICD-10-CM

## 2023-05-15 NOTE — Progress Notes (Signed)
ACUTE VISIT Chief Complaint  Patient presents with   Headache   HPI: [name] [name] is a 77 y.o. female with past medical history significant for chronic headaches, generalized OA, neck pain, paroxysmal atrial fibrillation on chronic anticoagulation, GERD, s/p mitral valve repair, and hypertension here today with her husband complaining of  L>R headache. Headaches has been persisting for almost a week. The pain is localized in the neck, parietal, and frontal regions. She rates the intensity of the pain as 7 or 8 out of 10 and describes it as different from previous headaches, with a sensation of heat and pressure. To alleviate the pain, the patient has been takin Tylenol and Tramadol.  Headache  This is a recurrent problem. The current episode started in the past 7 days. Associated symptoms include neck pain and scalp tenderness. Pertinent negatives include no abdominal pain, abnormal behavior, coughing, dizziness, drainage, ear pain, eye pain, eye redness, eye watering, facial sweating, fever, loss of balance, numbness, phonophobia, rhinorrhea, seizures, sinus pressure, sore throat, swollen glands, tinnitus, weakness or weight loss. Nothing aggravates the symptoms. Her past medical history is significant for hypertension and migraine headaches. There is no history of recent head traumas or sinus disease.   She reports experiencing two episodes of visual disturbances, both accompanied by headaches. The first episode occurred on June 7th, where the patient woke up with a hot head sensation and saw a bright silver circle that moved in a zigzag pattern in her visual field on left side. The second episode took place two weeks later, with similar visual disturbances but did not last long.  She already had the headache when visual symptoms presented. She has an appt with her ophthalmologist today. She denies any associated nausea or vomiting during these episodes. Mild photophobia. Hx of  migraine headache and was on Fioricet many years ago. She has not had her typical migraine headache in years. She has a history of meningioma, detected in an MRI in 2022. She follows with neurosurgeon for chronic neck pain, s/p cervical surgery and completed PT. Lower back pain, she sees ortho and has a lumbar MRI scheduled.  Mentions that her SBP was elevated at 154 when she woke up with headache. She is on Amlodipine 5 mg daily and Metoprolol succinate 25 mg daily. Atrial fibrillation and is currently on anticoagulant therapy with Coumadin.  Occasional leg swelling and uses compression stockings, which help. Lab Results  Component Value Date   CREATININE 0.88 01/22/2023   BUN 30 (H) 01/22/2023   NA 140 01/22/2023   K 4.0 01/22/2023   CL 105 01/22/2023   CO2 24 01/22/2023   Evaluated last by neurologist, Dr. Everlena Cooper, 08/02/21. Hx of cervicogenic headache and currently on Gabapentin 100 mg daily at bedtime. Brain MRI done in 07/2021: No acute intracranial abnormality, age-related cerebral atrophy with moderate chronic microvascular ischemic disease, and 1.6 cm calcified meningioma overlying to the frontal convexity without significant edema or mass effect.  Review of Systems  Constitutional:  Negative for fever and weight loss.  HENT:  Negative for ear pain, rhinorrhea, sinus pressure, sore throat and tinnitus.   Eyes:  Negative for pain and redness.  Respiratory:  Negative for cough and shortness of breath.   Cardiovascular:  Negative for chest pain and palpitations.  Gastrointestinal:  Negative for abdominal pain.  Genitourinary:  Negative for dysuria and hematuria.  Musculoskeletal:  Positive for neck pain.  Skin:  Negative for rash.  Neurological:  Positive for headaches. Negative for dizziness,  seizures, weakness, numbness and loss of balance.  Psychiatric/Behavioral:  Negative for confusion and hallucinations.   See other pertinent positives and negatives in HPI.  Current  Outpatient Medications on File Prior to Visit  Medication Sig Dispense Refill   acetaminophen (TYLENOL) 650 MG CR tablet Take 650 mg by mouth every 8 (eight) hours as needed for pain (Headache).     amLODipine (NORVASC) 5 MG tablet Take 1 tablet (5 mg total) by mouth daily. 180 tablet 0   aspirin EC 81 MG EC tablet Take 1 tablet (81 mg total) by mouth daily. Swallow whole. 30 tablet 11   atorvastatin (LIPITOR) 40 MG tablet TAKE ONE TABLET BY MOUTH ONCE DAILY 90 tablet 3   carbamide peroxide (DEBROX) 6.5 % OTIC solution Place 5 drops into the left ear 2 (two) times daily. 15 mL 0   cloNIDine (CATAPRES) 0.1 MG tablet Take 1 tablet (0.1 mg total) by mouth daily as needed. If blood pressure 160/90 or higher. 30 tablet 1   Cyanocobalamin (VITAMIN B-12) 2500 MCG SUBL Place 2,500 mcg under the tongue daily.     dicyclomine (BENTYL) 10 MG capsule Take 1 capsule (10 mg total) by mouth 2 (two) times daily. 60 capsule 3   diltiazem 2 % GEL Apply 1 Application topically 3 (three) times daily. Apply a pea sized amount internally (rectally) three times daily. 30 g 1   famotidine (PEPCID) 20 MG tablet Take 1 tablet (20 mg total) by mouth daily. 30 tablet 3   methylcellulose (CITRUCEL) oral powder Take 1 packet by mouth daily. 30 g 1   metoprolol succinate (TOPROL-XL) 25 MG 24 hr tablet TAKE ONE TABLET BY MOUTH EVERY MORNING and TAKE ONE TABLET BY MOUTH EVERY EVENING (Patient taking differently: TAKE ONE TABLET BY MOUTH EVERY MORNING and TAKE ONE TABLET BY MOUTH EVERY NIGHT) 180 tablet 2   Peppermint Oil (IBGARD) 90 MG CPCR Use as needed (Patient taking differently: Take 90 mg by mouth daily as needed (Stomach control). Use as needed)     Polyethyl Glycol-Propyl Glycol (SYSTANE) 0.4-0.3 % SOLN Place 1 drop into both eyes 2 (two) times daily.     traMADol (ULTRAM) 50 MG tablet Take 0.5-1 tablets (25-50 mg total) by mouth every 12 (twelve) hours as needed. 30 tablet 0   warfarin (COUMADIN) 2 MG tablet Take ONE TO 1 &  1/2 tablets BY MOUTH ONCE DAILY OR  AS DIRECTED BY COUMADIN CLINIC 100 tablet 0   No current facility-administered medications on file prior to visit.    Past Medical History:  Diagnosis Date   Arthritis    Atypical chest pain    Diverticulosis    Gastritis 11/08/2007, 04/12/2012   H pylori bx neg 03/2012   GERD (gastroesophageal reflux disease) 11/08/2007, 04/12/2012   Hepatic cyst    Hiatal hernia 11/08/2007, 04/12/2012   History of kidney stones    Hyperlipidemia    Hypertension    IBS (irritable bowel syndrome)    diarrhea predom   Migraine    Mitral regurgitation    Osteoporosis    DEXA 01/21/2012: -2.1 spine and R fem, -1.8 L fem s/p fosamax  2004-2011 DEXA 04/18/14 @ [facility]: -2.5 - rec to start Prolia     PVC's (premature ventricular contractions)    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   Complex valvuloplasty including PTFE neochord placement x6 with 30 mm Medtronic Simuform ring annuloplasty with clipping of LA appendage   Schatzki's ring 11/11/2010   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  Shingles outbreak 04/2013   No Known Allergies  Social History   Socioeconomic History   Marital status: Married    Spouse name: Madaline Guthrie   Number of children: 3   Years of education: Not on file   Highest education level: Master's degree (e.g., MA, MS, MEng, MEd, MSW, MBA)  Occupational History   Occupation: retired  Tobacco Use   Smoking status: Never   Smokeless tobacco: Never  Vaping Use   Vaping Use: Never used  Substance and Sexual Activity   Alcohol use: No   Drug use: No   Sexual activity: Not on file  Other Topics Concern   Not on file  Social History Narrative   Married, lives with spouse. Retired Comptroller, Manufacturing systems engineer. Has 3 kids (moved to [place] to be closer)- youngest son MD. Linton Ham to [place] from [place] 06/2010, lived in [place] x 10years   Social Determinants of Health   Financial Resource Strain: Low Risk  (05/15/2023)   Overall Financial  Resource Strain (CARDIA)    Difficulty of Paying Living Expenses: Not hard at all  Food Insecurity: No Food Insecurity (05/15/2023)   Hunger Vital Sign    Worried About Running Out of Food in the Last Year: Never true    Ran Out of Food in the Last Year: Never true  Transportation Needs: No Transportation Needs (05/15/2023)   PRAPARE - Administrator, Civil Service (Medical): No    Lack of Transportation (Non-Medical): No  Physical Activity: Insufficiently Active (05/15/2023)   Exercise Vital Sign    Days of Exercise per Week: 3 days    Minutes of Exercise per Session: 30 min  Stress: No Stress Concern Present (05/15/2023)   [employer] of Occupational Health - Occupational Stress Questionnaire    Feeling of Stress : Not at all  Social Connections: Socially Integrated (05/15/2023)   Social Connection and Isolation Panel [NHANES]    Frequency of Communication with Friends and Family: More than three times a week    Frequency of Social Gatherings with Friends and Family: More than three times a week    Attends Religious Services: More than 4 times per year    Active Member of [employer] or Organizations: Yes    Attends Banker Meetings: More than 4 times per year    Marital Status: Married    Vitals:   05/18/23 1130  BP: 120/76  Pulse: 64  Resp: 16  Temp: 98 F (36.7 C)  SpO2: 98%   Body mass index is 24.45 kg/m.  Physical Exam Vitals and nursing note reviewed.  Constitutional:      General: She is not in acute distress.    Appearance: She is well-developed.  HENT:     Head: Normocephalic and atraumatic.     Mouth/Throat:     Mouth: Mucous membranes are moist.     Pharynx: Oropharynx is clear.  Eyes:     Conjunctiva/sclera: Conjunctivae normal.  Cardiovascular:     Rate and Rhythm: Normal rate and regular rhythm.     Heart sounds: No murmur heard. Pulmonary:     Effort: Pulmonary effort is normal. No respiratory distress.     Breath sounds:  Normal breath sounds.  Abdominal:     Palpations: Abdomen is soft. There is no mass.     Tenderness: There is no abdominal tenderness.  Musculoskeletal:     Cervical back: Spasms present. No bony tenderness. Decreased range of motion.     Right lower leg:  No edema.     Left lower leg: No edema.  Lymphadenopathy:     Cervical: No cervical adenopathy.  Skin:    General: Skin is warm.     Findings: No erythema or rash.  Neurological:     General: No focal deficit present.     Mental Status: She is alert and oriented to person, place, and time.     Cranial Nerves: No cranial nerve deficit.     Gait: Gait normal.  Psychiatric:        Mood and Affect: Mood and affect normal.   ASSESSMENT AND PLAN:  Ms. Dejoseph was seen today for headache.  Headache, unspecified headache type She is reporting this headache as different to her usual headache. Migraine vs tension headache. Because co morbilities medications like Amitriptyline or Nortriptyline are a good option. Neuro referral placed.   -     Ambulatory referral to Neurology  Essential hypertension Assessment & Plan: BP adequately controlled today. Recent elevated BP could have been caused by pain. Continue  Amlodipine 5 mg daily and Metoprolol succinate 25 mg daily as well as low salt diet. Continue monitoring BP regularly.  Cervicogenic headache Assessment & Plan: She is not sure if Gabapentin is helping, agrees with increasing dose from 100 mg to 200 mg at bedtime. I do not think imaging imaging is needed at this time. New referral to neurologist placed to see Dr.Jaffe. Instructed about warning signs.  Orders: -     Gabapentin; Take 2 capsules (200 mg total) by mouth at bedtime.  Dispense: 60 capsule; Refill: 2  Visual aura With associated headache, ? Migraine related. She has an appt with her ophthalmologist today.  I spent a total of 34 minutes in both face to face and non face to face activities for this visit on  the date of this encounter. During this time history was obtained and documented, examination was performed, prior labs/imaging reviewed, and assessment/plan discussed.  Return if symptoms worsen or fail to improve, for keep next appointment.  Ashaun Gaughan G. [place], MD  [facility]. [place] [facility].

## 2023-05-15 NOTE — Telephone Encounter (Signed)
LVM for pt to return call--pt needs to be triaged due to symptoms for 05/18/23 appointment//tes

## 2023-05-15 NOTE — Progress Notes (Signed)
Subjective:   [name] [name] is a 77 y.o. female who presents for Medicare Annual (Subsequent) preventive examination.  Visit Complete: In person  Patient Medicare AWV questionnaire was completed by the patient on ; I have confirmed that all information answered by patient is correct and no changes since this date.  Review of Systems     Cardiac Risk Factors include: advanced age (>7men, >3 women);hypertension     Objective:    Today's Vitals   05/15/23 1021  BP: 122/60  Pulse: 67  Temp: 98.3 F (36.8 C)  TempSrc: Oral  SpO2: 96%  Weight: 136 lb 11.2 oz (62 kg)  Height: 5\' 3"  (1.6 m)   Body mass index is 24.22 kg/m.     05/15/2023   10:40 AM 04/28/2022   10:10 AM 02/06/2022    3:15 PM 10/01/2021    9:40 AM 07/29/2021    1:00 PM 06/05/2021    9:17 AM 05/23/2021    7:40 AM  Advanced Directives  Does Patient Have a Medical Advance Directive? Yes Yes Yes Yes No;Yes Yes Yes  Type of Estate agent of [place];Living will Living will;Healthcare Power of [employer] Power of [place];Living will  Living will  Healthcare Power of [place];Living will  Does patient want to make changes to medical advance directive?  No - Patient declined No - Patient declined    No - Patient declined  Copy of Healthcare Power of Attorney in Chart? No - copy requested No - copy requested No - copy requested    No - copy requested    Current Medications (verified) Outpatient Encounter Medications as of 05/15/2023  Medication Sig   acetaminophen (TYLENOL) 650 MG CR tablet Take 650 mg by mouth every 8 (eight) hours as needed for pain (Headache).   amLODipine (NORVASC) 5 MG tablet Take 1 tablet (5 mg total) by mouth daily.   aspirin EC 81 MG EC tablet Take 1 tablet (81 mg total) by mouth daily. Swallow whole.   atorvastatin (LIPITOR) 40 MG tablet TAKE ONE TABLET BY MOUTH ONCE DAILY   carbamide peroxide (DEBROX) 6.5 % OTIC solution Place 5 drops into the left ear 2  (two) times daily.   cloNIDine (CATAPRES) 0.1 MG tablet Take 1 tablet (0.1 mg total) by mouth daily as needed. If blood pressure 160/90 or higher.   Cyanocobalamin (VITAMIN B-12) 2500 MCG SUBL Place 2,500 mcg under the tongue daily.   dicyclomine (BENTYL) 10 MG capsule Take 1 capsule (10 mg total) by mouth 2 (two) times daily.   diltiazem 2 % GEL Apply 1 Application topically 3 (three) times daily. Apply a pea sized amount internally (rectally) three times daily.   famotidine (PEPCID) 20 MG tablet Take 1 tablet (20 mg total) by mouth daily.   gabapentin (NEURONTIN) 100 MG capsule TAKE ONE CAPSULE BY MOUTH EVERYDAY AT BEDTIME   methylcellulose (CITRUCEL) oral powder Take 1 packet by mouth daily.   metoprolol succinate (TOPROL-XL) 25 MG 24 hr tablet TAKE ONE TABLET BY MOUTH EVERY MORNING and TAKE ONE TABLET BY MOUTH EVERY EVENING (Patient taking differently: TAKE ONE TABLET BY MOUTH EVERY MORNING and TAKE ONE TABLET BY MOUTH EVERY NIGHT)   Peppermint Oil (IBGARD) 90 MG CPCR Use as needed (Patient taking differently: Take 90 mg by mouth daily as needed (Stomach control). Use as needed)   Polyethyl Glycol-Propyl Glycol (SYSTANE) 0.4-0.3 % SOLN Place 1 drop into both eyes 2 (two) times daily.   traMADol (ULTRAM) 50 MG tablet Take  0.5-1 tablets (25-50 mg total) by mouth every 12 (twelve) hours as needed.   warfarin (COUMADIN) 2 MG tablet Take ONE TO 1 & 1/2 tablets BY MOUTH ONCE DAILY OR  AS DIRECTED BY COUMADIN CLINIC   No facility-administered encounter medications on file as of 05/15/2023.    Allergies (verified) Patient has no known allergies.   History: Past Medical History:  Diagnosis Date   Arthritis    Atypical chest pain    Diverticulosis    Gastritis 11/08/2007, 04/12/2012   H pylori bx neg 03/2012   GERD (gastroesophageal reflux disease) 11/08/2007, 04/12/2012   Hepatic cyst    Hiatal hernia 11/08/2007, 04/12/2012   History of kidney stones    Hyperlipidemia    Hypertension    IBS  (irritable bowel syndrome)    diarrhea predom   Migraine    Mitral regurgitation    Osteoporosis    DEXA 01/21/2012: -2.1 spine and R fem, -1.8 L fem s/p fosamax  2004-2011 DEXA 04/18/14 @ [facility]: -2.5 - rec to start Prolia     PVC's (premature ventricular contractions)    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   Complex valvuloplasty including PTFE neochord placement x6 with 30 mm Medtronic Simuform ring annuloplasty with clipping of LA appendage   Schatzki's ring 11/11/2010   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   Shingles outbreak 04/2013   Past Surgical History:  Procedure Laterality Date   ANTERIOR CERVICAL DECOMP/DISCECTOMY FUSION N/A 05/06/2022   Procedure: CERVICAL THREE-FOUR, CERVICAL FOUR-FIVE ANTERIOR CERVICAL DECOMPRESSION/DISCECTOMY FUSION;  Surgeon: Julio Sicks, MD;  Location: [facility] OR;  Service: Neurosurgery;  Laterality: N/A;   APPENDECTOMY  1958   BUBBLE STUDY  03/14/2021   Procedure: BUBBLE STUDY;  Surgeon: Quintella Reichert, MD;  Location: [facility] ENDOSCOPY;  Service: Cardiovascular;;   CARDIAC CATHETERIZATION     CLIPPING OF ATRIAL APPENDAGE N/A 05/21/2021   Procedure: CLIPPING OF ATRIAL APPENDAGE USING ATRICURE PRO2 CLIP;  Surgeon: Purcell Nails, MD;  Location: [facility] OR;  Service: Open Heart Surgery;  Laterality: N/A;   COLONOSCOPY     CYSTOCELE REPAIR  2008   ESOPHAGOGASTRODUODENOSCOPY     Maxilofacial  1992   MITRAL VALVE REPAIR  05/21/2021   Procedure: MINIMALLY INVASIVE MITRAL VALVE REPAIR (MVR) USING MEDTRONIC SIMUFORM RING;  Surgeon: Purcell Nails, MD;  Location: [facility] OR;  Service: Open Heart Surgery;;   RIGHT/LEFT HEART CATH AND CORONARY ANGIOGRAPHY N/A 03/14/2021   Procedure: RIGHT/LEFT HEART CATH AND CORONARY ANGIOGRAPHY;  Surgeon: Runell Gess, MD;  Location: [facility] INVASIVE CV LAB;  Service: Cardiovascular;  Laterality: N/A;   TEE WITHOUT CARDIOVERSION N/A 03/14/2021   Procedure: TRANSESOPHAGEAL ECHOCARDIOGRAM (TEE);  Surgeon: Quintella Reichert, MD;  Location: [facility] ENDOSCOPY;  Service: Cardiovascular;  Laterality: N/A;   TEE WITHOUT CARDIOVERSION N/A 05/21/2021   Procedure: TRANSESOPHAGEAL ECHOCARDIOGRAM (TEE);  Surgeon: Purcell Nails, MD;  Location: [facility] OR;  Service: Open Heart Surgery;  Laterality: N/A;   TONSILLECTOMY  1960   Family History  Problem Relation Age of Onset   Hypertension Mother    Alzheimer's disease Mother 66   AAA (abdominal aortic aneurysm) Mother    Stroke Father 8   Kidney cancer Brother        mets   Bone cancer Brother 70   Colon cancer Neg Hx    Social History   Socioeconomic History   Marital status: Married    Spouse name: Madaline Guthrie   Number of children: 3   Years of education: Not  on file   Highest education level: Master's degree (e.g., MA, MS, MEng, MEd, MSW, MBA)  Occupational History   Occupation: retired  Tobacco Use   Smoking status: Never   Smokeless tobacco: Never  Vaping Use   Vaping Use: Never used  Substance and Sexual Activity   Alcohol use: No   Drug use: No   Sexual activity: Not on file  Other Topics Concern   Not on file  Social History Narrative   Married, lives with spouse. Retired Comptroller, Manufacturing systems engineer. Has 3 kids (moved to [place] to be closer)- youngest son MD. [place] Ham to [place] from [place] 06/2010, lived in [place] x 10years   Social Determinants of Health   Financial Resource Strain: Low Risk  (05/15/2023)   Overall Financial Resource Strain (CARDIA)    Difficulty of Paying Living Expenses: Not hard at all  Food Insecurity: No Food Insecurity (05/15/2023)   Hunger Vital Sign    Worried About Running Out of Food in the Last Year: Never true    Ran Out of Food in the Last Year: Never true  Transportation Needs: No Transportation Needs (05/15/2023)   PRAPARE - Administrator, Civil Service (Medical): No    Lack of Transportation (Non-Medical): No  Physical Activity: Insufficiently Active (05/15/2023)   Exercise Vital Sign    Days of Exercise per Week:  3 days    Minutes of Exercise per Session: 30 min  Stress: No Stress Concern Present (05/15/2023)   [employer] of Occupational Health - Occupational Stress Questionnaire    Feeling of Stress : Not at all  Social Connections: Socially Integrated (05/15/2023)   Social Connection and Isolation Panel [NHANES]    Frequency of Communication with Friends and Family: More than three times a week    Frequency of Social Gatherings with Friends and Family: More than three times a week    Attends Religious Services: More than 4 times per year    Active Member of [employer] or Organizations: Yes    Attends Engineer, structural: More than 4 times per year    Marital Status: Married    Tobacco Counseling Counseling given: Not Answered   Clinical Intake:  Pre-visit preparation completed: No  Pain : No/denies pain     BMI - recorded: 24.22 Nutritional Status: BMI of 19-24  Normal Nutritional Risks: None Diabetes: No  How often do you need to have someone help you when you read instructions, pamphlets, or other written materials from your doctor or pharmacy?: 1 - Never  Interpreter Needed?: No  Information entered by :: Theresa Mulligan LPN   Activities of Daily Living    05/15/2023   10:39 AM  In your present state of health, do you have any difficulty performing the following activities:  Hearing? 0  Vision? 0  Difficulty concentrating or making decisions? 0  Walking or climbing stairs? 0  Dressing or bathing? 0  Doing errands, shopping? 0  Preparing Food and eating ? N  Using the Toilet? N  In the past six months, have you accidently leaked urine? N  Do you have problems with loss of bowel control? N  Managing your Medications? N  Managing your Finances? N  Housekeeping or managing your Housekeeping? N    Patient Care Team: [place], Betty G, MD as PCP - General (Family Medicine) Runell Gess, MD as PCP - Cardiology (Cardiology) Mardella Layman, MD as  Consulting Physician (Gastroenterology) Judi Saa, DO (Sports Medicine) Nanetta Batty  J, MD (Cardiology) Drema Dallas, DO as Consulting Physician (Neurology) Kathyrn Sheriff, [facility] (Inactive) as Pharmacist (Pharmacist)  Indicate any recent Medical Services you may have received from other than [facility] providers in the past year (date may be approximate).     Assessment:   This is a routine wellness examination for [place].  Hearing/Vision screen Hearing Screening - Comments:: Denies hearing difficulties   Vision Screening - Comments:: Wears rx glasses - up to date with routine eye exams with  Dr Elisha Headland  Dietary issues and exercise activities discussed:     Goals Addressed               This Visit's Progress     No current goals (pt-stated)         Depression Screen    05/15/2023   10:27 AM 12/22/2022   11:36 AM 10/28/2022   10:32 AM 10/13/2022    3:05 PM 08/26/2022   10:36 AM 07/01/2022   10:52 AM 07/01/2022   10:05 AM  PHQ 2/9 Scores  PHQ - 2 Score 0 0 0 0 0 0 0  PHQ- 9 Score     3 3     Fall Risk    05/15/2023   10:39 AM 12/22/2022   11:36 AM 10/28/2022   10:32 AM 10/13/2022    3:05 PM 08/26/2022   10:36 AM  Fall Risk   Falls in the past year? 0 0 0 0 0  Number falls in past yr: 0 0 0 0 0  Injury with Fall? 0 0 0 0 0  Risk for fall due to : No Fall Risks Other (Comment) History of fall(s) History of fall(s) History of fall(s)  Follow up Falls prevention discussed Falls evaluation completed Falls evaluation completed Falls evaluation completed Falls evaluation completed    MEDICARE RISK AT HOME:  Medicare Risk at Home - 05/15/23 1042     Any stairs in or around the home? No    If so, are there any without handrails? No    Home free of loose throw rugs in walkways, pet beds, electrical cords, etc? Yes    Adequate lighting in your home to reduce risk of falls? Yes    Life alert? No    Use of a cane, walker or w/c? No    Grab bars in the bathroom? Yes     Shower chair or bench in shower? No    Elevated toilet seat or a handicapped toilet? No             TIMED UP AND GO:  Was the test performed?  Yes  Length of time to ambulate 10 feet: 10 sec Gait steady and fast without use of assistive device    Cognitive Function:        05/15/2023   10:40 AM 02/06/2022    3:16 PM  6CIT Screen  What Year? 0 points 0 points  What month? 0 points 0 points  What time? 0 points 0 points  Count back from 20 0 points 0 points  Months in reverse 0 points 0 points  Repeat phrase 0 points 0 points  Total Score 0 points 0 points    Immunizations Immunization History  Administered Date(s) Administered   Fluad Quad(high Dose 65+) 11/02/2019   Influenza Split 10/13/2011, 08/24/2012   Influenza Whole 09/27/2010   Influenza, High Dose Seasonal PF 10/02/2017   Influenza,inj,Quad PF,6+ Mos 08/01/2013, 10/30/2015   Influenza-Unspecified 09/22/2016   PFIZER(Purple Top)SARS-COV-2 Vaccination 02/03/2020,  02/28/2020   Pneumococcal Conjugate-13 04/16/2015   Pneumococcal Polysaccharide-23 01/14/2012   Tetanus 01/14/2012    TDAP status: Due, Education has been provided regarding the importance of this vaccine. Advised may receive this vaccine at local pharmacy or Health Dept. Aware to provide a copy of the vaccination record if obtained from local pharmacy or Health Dept. Verbalized acceptance and understanding.  Flu Vaccine status: Declined, Education has been provided regarding the importance of this vaccine but patient still declined. Advised may receive this vaccine at local pharmacy or Health Dept. Aware to provide a copy of the vaccination record if obtained from local pharmacy or Health Dept. Verbalized acceptance and understanding.  Pneumococcal vaccine status: Completed during today's visit.  Covid-19 vaccine status: Completed vaccines  Qualifies for Shingles Vaccine? Yes   Zostavax completed No   Shingrix Completed?: No.    Education has  been provided regarding the importance of this vaccine. Patient has been advised to call insurance company to determine out of pocket expense if they have not yet received this vaccine. Advised may also receive vaccine at local pharmacy or Health Dept. Verbalized acceptance and understanding.  Screening Tests Health Maintenance  Topic Date Due   DTaP/Tdap/Td (1 - Tdap) 01/15/2012   COVID-19 Vaccine (3 - Pfizer risk series) 05/31/2023 (Originally 03/27/2020)   Zoster Vaccines- Shingrix (1 of 2) 08/15/2023 (Originally 06/03/1965)   INFLUENZA VACCINE  06/25/2023   Medicare Annual Wellness (AWV)  05/14/2024   DEXA SCAN  06/09/2024   Pneumonia Vaccine 15+ Years old  Completed   Hepatitis C Screening  Completed   HPV VACCINES  Aged Out   Colonoscopy  Discontinued    Health Maintenance  Health Maintenance Due  Topic Date Due   DTaP/Tdap/Td (1 - Tdap) 01/15/2012    Colorectal cancer screening: No longer required.   Mammogram status: No longer required due to Age.  Bone Density status: Completed 06/09/22. Results reflect: Bone density results: OSTEOPOROSIS. Repeat every   years.  Lung Cancer Screening: (Low Dose CT Chest recommended if Age 12-80 years, 20 pack-year currently smoking OR have quit w/in 15years.) does not qualify.     Additional Screening:  Hepatitis C Screening: does qualify; Completed 06/03/16  Vision Screening: Recommended annual ophthalmology exams for early detection of glaucoma and other disorders of the eye. Is the patient up to date with their annual eye exam?  Yes  Who is the provider or what is the name of the office in which the patient attends annual eye exams? Dr Jimmey Ralph If pt is not established with a provider, would they like to be referred to a provider to establish care? No .   Dental Screening: Recommended annual dental exams for proper oral hygiene    Community Resource Referral / Chronic Care Management:  CRR required this visit?  No   CCM  required this visit?  No     Plan:     I have personally reviewed and noted the following in the patient's chart:   Medical and social history Use of alcohol, tobacco or illicit drugs  Current medications and supplements including opioid prescriptions. Patient is currently taking opioid prescriptions. Information provided to patient regarding non-opioid alternatives. Patient advised to discuss non-opioid treatment plan with their provider. Functional ability and status Nutritional status Physical activity Advanced directives List of other physicians Hospitalizations, surgeries, and ER visits in previous 12 months Vitals Screenings to include cognitive, depression, and falls Referrals and appointments  In addition, I have reviewed and discussed with patient  certain preventive protocols, quality metrics, and best practice recommendations. A written personalized care plan for preventive services as well as general preventive health recommendations were provided to patient.     Tillie Rung, LPN   1/61/0960   After Visit Summary: Given  Nurse Notes: None

## 2023-05-15 NOTE — Patient Instructions (Addendum)
[name] , Thank you for taking time to come for your Medicare Wellness Visit. I appreciate your ongoing commitment to your health goals. Please review the following plan we discussed and let me know if I can assist you in the future.   These are the goals we discussed:  Goals       Increase physical activity (pt-stated)      Make better use of time.      Manage My Medicine      Timeframe:  Long-Range Goal Priority:  High Start Date:       06/18/21                      Expected End Date:     12/19/21                  Follow Up Date Oct 2022   - call for medicine refill 2 or 3 days before it runs out - call if I am sick and can't take my medicine - keep a list of all the medicines I take; vitamins and herbals too - use a pillbox to sort medicine  -Stop taking losartan (as advised at CT surgery follow up) -Follow up with PCP for Prolia scheduling -Ensure consistent Vitamin K intake (leafy greens, broccoli)   Why is this important?   These steps will help you keep on track with your medicines.   Notes:       No current goals (pt-stated)        This is a list of the screening recommended for you and due dates:  Health Maintenance  Topic Date Due   DTaP/Tdap/Td vaccine (1 - Tdap) 01/15/2012   COVID-19 Vaccine (3 - Pfizer risk series) 05/31/2023*   Zoster (Shingles) Vaccine (1 of 2) 08/15/2023*   Flu Shot  06/25/2023   Medicare Annual Wellness Visit  05/14/2024   DEXA scan (bone density measurement)  06/09/2024   Pneumonia Vaccine  Completed   Hepatitis C Screening  Completed   HPV Vaccine  Aged Out   Colon Cancer Screening  Discontinued  *Topic was postponed. The date shown is not the original due date.   Opioid Pain Medicine Management Opioids are powerful medicines that are used to treat moderate to severe pain. When used for short periods of time, they can help you to: Sleep better. Do better in physical or occupational therapy. Feel better in the first few days  after an injury. Recover from surgery. Opioids should be taken with the supervision of a trained health care provider. They should be taken for the shortest period of time possible. This is because opioids can be addictive, and the longer you take opioids, the greater your risk of addiction. This addiction can also be called opioid use disorder. What are the risks? Using opioid pain medicines for longer than 3 days increases your risk of side effects. Side effects include: Constipation. Nausea and vomiting. Breathing difficulties (respiratory depression). Drowsiness. Confusion. Opioid use disorder. Itching. Taking opioid pain medicine for a long period of time can affect your ability to do daily tasks. It also puts you at risk for: Motor vehicle crashes. Depression. Suicide. Heart attack. Overdose, which can be life-threatening. What is a pain treatment plan? A pain treatment plan is an agreement between you and your health care provider. Pain is unique to each person, and treatments vary depending on your condition. To manage your pain, you and your health care provider need to work  together. To help you do this: Discuss the goals of your treatment, including how much pain you might expect to have and how you will manage the pain. Review the risks and benefits of taking opioid medicines. Remember that a good treatment plan uses more than one approach and minimizes the chance of side effects. Be honest about the amount of medicines you take and about any drug or alcohol use. Get pain medicine prescriptions from only one health care provider. Pain can be managed with many types of alternative treatments. Ask your health care provider to refer you to one or more specialists who can help you manage pain through: Physical or occupational therapy. Counseling (cognitive behavioral therapy). Good nutrition. Biofeedback. Massage. Meditation. Non-opioid medicine. Following a gentle exercise  program. How to use opioid pain medicine Taking medicine Take your pain medicine exactly as told by your health care provider. Take it only when you need it. If your pain gets less severe, you may take less than your prescribed dose if your health care provider approves. If you are not having pain, do nottake pain medicine unless your health care provider tells you to take it. If your pain is severe, do nottry to treat it yourself by taking more pills than instructed on your prescription. Contact your health care provider for help. Write down the times when you take your pain medicine. It is easy to become confused while on pain medicine. Writing the time can help you avoid overdose. Take other over-the-counter or prescription medicines only as told by your health care provider. Keeping yourself and others safe  While you are taking opioid pain medicine: Do not drive, use machinery, or power tools. Do not sign legal documents. Do not drink alcohol. Do not take sleeping pills. Do not supervise children by yourself. Do not do activities that require climbing or being in high places. Do not go to a lake, river, ocean, spa, or swimming pool. Do not share your pain medicine with anyone. Keep pain medicine in a locked cabinet or in a secure area where pets and children cannot reach it. Stopping your use of opioids If you have been taking opioid medicine for more than a few weeks, you may need to slowly decrease (taper) how much you take until you stop completely. Tapering your use of opioids can decrease your risk of symptoms of withdrawal, such as: Pain and cramping in the abdomen. Nausea. Sweating. Sleepiness. Restlessness. Uncontrollable shaking (tremors). Cravings for the medicine. Do not attempt to taper your use of opioids on your own. Talk with your health care provider about how to do this. Your health care provider may prescribe a step-down schedule based on how much medicine you are  taking and how long you have been taking it. Getting rid of leftover pills Do not save any leftover pills. Get rid of leftover pills safely by: Taking the medicine to a prescription take-back program. This is usually offered by the county or law enforcement. Bringing them to a pharmacy that has a drug disposal container. Flushing them down the toilet. Check the label or package insert of your medicine to see whether this is safe to do. Throwing them out in the trash. Check the label or package insert of your medicine to see whether this is safe to do. If it is safe to throw it out, remove the medicine from the original container, put it into a sealable bag or container, and mix it with used coffee grounds, food scraps, dirt, or  cat litter before putting it in the trash. Follow these instructions at home: Activity Do exercises as told by your health care provider. Avoid activities that make your pain worse. Return to your normal activities as told by your health care provider. Ask your health care provider what activities are safe for you. General instructions You may need to take these actions to prevent or treat constipation: Drink enough fluid to keep your urine pale yellow. Take over-the-counter or prescription medicines. Eat foods that are high in fiber, such as beans, whole grains, and fresh fruits and vegetables. Limit foods that are high in fat and processed sugars, such as fried or sweet foods. Keep all follow-up visits. This is important. Where to find support If you have been taking opioids for a long time, you may benefit from receiving support for quitting from a local support group or counselor. Ask your health care provider for a referral to these resources in your area. Where to find more information Centers for Disease Control and Prevention (CDC): FootballExhibition.com.br [place] Food and Drug Administration (FDA): PumpkinSearch.com.ee Get help right away if: You may have taken too much of an  opioid (overdosed). Common symptoms of an overdose: Your breathing is slower or more shallow than normal. You have a very slow heartbeat (pulse). You have slurred speech. You have nausea and vomiting. Your pupils become very small. You have other potential symptoms: You are very confused. You faint or feel like you will faint. You have cold, clammy skin. You have blue lips or fingernails. You have thoughts of harming yourself or harming others. These symptoms may represent a serious problem that is an emergency. Do not wait to see if the symptoms will go away. Get medical help right away. Call your local emergency services (911 in the [place] Do not drive yourself to the hospital.  If you ever feel like you may hurt yourself or others, or have thoughts about taking your own life, get help right away. Go to your nearest emergency department or: Call your local emergency services (911 in the [place] Call the [facility] (737-840-0370 in the [place] Call a suicide crisis helpline, such as the National Suicide Prevention Lifeline at 419-214-8957 or 988 in the [place] This is open 24 hours a day in the [place] Text the Crisis Text Line at (580)732-8947 (in the [place] Summary Opioid medicines can help you manage moderate to severe pain for a short period of time. A pain treatment plan is an agreement between you and your health care provider. Discuss the goals of your treatment, including how much pain you might expect to have and how you will manage the pain. If you think that you or someone else may have taken too much of an opioid, get medical help right away. This information is not intended to replace advice given to you by your health care provider. Make sure you discuss any questions you have with your health care provider. Document Revised: 06/05/2021 Document Reviewed: 02/20/2021 Elsevier Patient Education  2024 Elsevier Inc.  Advanced directives: Please bring a copy of your  health care power of attorney and living will to the office to be added to your chart at your convenience.   Conditions/risks identified: None  Next appointment: Follow up in one year for your annual wellness visit    Preventive Care 65 Years and Older, Female Preventive care refers to lifestyle choices and visits with your health care provider that can promote health and wellness. What does preventive  care include? A yearly physical exam. This is also called an annual well check. Dental exams once or twice a year. Routine eye exams. Ask your health care provider how often you should have your eyes checked. Personal lifestyle choices, including: Daily care of your teeth and gums. Regular physical activity. Eating a healthy diet. Avoiding tobacco and drug use. Limiting alcohol use. Practicing safe sex. Taking low-dose aspirin every day. Taking vitamin and mineral supplements as recommended by your health care provider. What happens during an annual well check? The services and screenings done by your health care provider during your annual well check will depend on your age, overall health, lifestyle risk factors, and family history of disease. Counseling  Your health care provider may ask you questions about your: Alcohol use. Tobacco use. Drug use. Emotional well-being. Home and relationship well-being. Sexual activity. Eating habits. History of falls. Memory and ability to understand (cognition). Work and work Astronomer. Reproductive health. Screening  You may have the following tests or measurements: Height, weight, and BMI. Blood pressure. Lipid and cholesterol levels. These may be checked every 5 years, or more frequently if you are over 25 years old. Skin check. Lung cancer screening. You may have this screening every year starting at age 71 if you have a 30-pack-year history of smoking and currently smoke or have quit within the past 15 years. Fecal occult blood  test (FOBT) of the stool. You may have this test every year starting at age 35. Flexible sigmoidoscopy or colonoscopy. You may have a sigmoidoscopy every 5 years or a colonoscopy every 10 years starting at age 70. Hepatitis C blood test. Hepatitis B blood test. Sexually transmitted disease (STD) testing. Diabetes screening. This is done by checking your blood sugar (glucose) after you have not eaten for a while (fasting). You may have this done every 1-3 years. Bone density scan. This is done to screen for osteoporosis. You may have this done starting at age 52. Mammogram. This may be done every 1-2 years. Talk to your health care provider about how often you should have regular mammograms. Talk with your health care provider about your test results, treatment options, and if necessary, the need for more tests. Vaccines  Your health care provider may recommend certain vaccines, such as: Influenza vaccine. This is recommended every year. Tetanus, diphtheria, and acellular pertussis (Tdap, Td) vaccine. You may need a Td booster every 10 years. Zoster vaccine. You may need this after age 66. Pneumococcal 13-valent conjugate (PCV13) vaccine. One dose is recommended after age 71. Pneumococcal polysaccharide (PPSV23) vaccine. One dose is recommended after age 50. Talk to your health care provider about which screenings and vaccines you need and how often you need them. This information is not intended to replace advice given to you by your health care provider. Make sure you discuss any questions you have with your health care provider. Document Released: 12/07/2015 Document Revised: 07/30/2016 Document Reviewed: 09/11/2015 Elsevier Interactive Patient Education  2017 [employer].  Fall Prevention in the Home Falls can cause injuries. They can happen to people of all ages. There are many things you can do to make your home safe and to help prevent falls. What can I do on the outside of my  home? Regularly fix the edges of walkways and driveways and fix any cracks. Remove anything that might make you trip as you walk through a door, such as a raised step or threshold. Trim any bushes or trees on the path to your  home. Use bright outdoor lighting. Clear any walking paths of anything that might make someone trip, such as rocks or tools. Regularly check to see if handrails are loose or broken. Make sure that both sides of any steps have handrails. Any raised decks and porches should have guardrails on the edges. Have any leaves, snow, or ice cleared regularly. Use sand or salt on walking paths during winter. Clean up any spills in your garage right away. This includes oil or grease spills. What can I do in the bathroom? Use night lights. Install grab bars by the toilet and in the tub and shower. Do not use towel bars as grab bars. Use non-skid mats or decals in the tub or shower. If you need to sit down in the shower, use a plastic, non-slip stool. Keep the floor dry. Clean up any water that spills on the floor as soon as it happens. Remove soap buildup in the tub or shower regularly. Attach bath mats securely with double-sided non-slip rug tape. Do not have throw rugs and other things on the floor that can make you trip. What can I do in the bedroom? Use night lights. Make sure that you have a light by your bed that is easy to reach. Do not use any sheets or blankets that are too big for your bed. They should not hang down onto the floor. Have a firm chair that has side arms. You can use this for support while you get dressed. Do not have throw rugs and other things on the floor that can make you trip. What can I do in the kitchen? Clean up any spills right away. Avoid walking on wet floors. Keep items that you use a lot in easy-to-reach places. If you need to reach something above you, use a strong step stool that has a grab bar. Keep electrical cords out of the way. Do  not use floor polish or wax that makes floors slippery. If you must use wax, use non-skid floor wax. Do not have throw rugs and other things on the floor that can make you trip. What can I do with my stairs? Do not leave any items on the stairs. Make sure that there are handrails on both sides of the stairs and use them. Fix handrails that are broken or loose. Make sure that handrails are as long as the stairways. Check any carpeting to make sure that it is firmly attached to the stairs. Fix any carpet that is loose or worn. Avoid having throw rugs at the top or bottom of the stairs. If you do have throw rugs, attach them to the floor with carpet tape. Make sure that you have a light switch at the top of the stairs and the bottom of the stairs. If you do not have them, ask someone to add them for you. What else can I do to help prevent falls? Wear shoes that: Do not have high heels. Have rubber bottoms. Are comfortable and fit you well. Are closed at the toe. Do not wear sandals. If you use a stepladder: Make sure that it is fully opened. Do not climb a closed stepladder. Make sure that both sides of the stepladder are locked into place. Ask someone to hold it for you, if possible. Clearly mark and make sure that you can see: Any grab bars or handrails. First and last steps. Where the edge of each step is. Use tools that help you move around (mobility aids) if they are  needed. These include: Canes. Walkers. Scooters. Crutches. Turn on the lights when you go into a dark area. Replace any light bulbs as soon as they burn out. Set up your furniture so you have a clear path. Avoid moving your furniture around. If any of your floors are uneven, fix them. If there are any pets around you, be aware of where they are. Review your medicines with your doctor. Some medicines can make you feel dizzy. This can increase your chance of falling. Ask your doctor what other things that you can do to  help prevent falls. This information is not intended to replace advice given to you by your health care provider. Make sure you discuss any questions you have with your health care provider. Document Released: 09/06/2009 Document Revised: 04/17/2016 Document Reviewed: 12/15/2014 Elsevier Interactive Patient Education  2017 [employer].

## 2023-05-18 ENCOUNTER — Encounter: Payer: Self-pay | Admitting: Family Medicine

## 2023-05-18 ENCOUNTER — Ambulatory Visit: Payer: Medicare HMO | Admitting: Family Medicine

## 2023-05-18 ENCOUNTER — Ambulatory Visit (INDEPENDENT_AMBULATORY_CARE_PROVIDER_SITE_OTHER): Payer: Medicare HMO | Admitting: Family Medicine

## 2023-05-18 VITALS — BP 120/76 | HR 64 | Temp 98.0°F | Resp 16 | Ht 63.0 in | Wt 138.0 lb

## 2023-05-18 DIAGNOSIS — H40013 Open angle with borderline findings, low risk, bilateral: Secondary | ICD-10-CM | POA: Diagnosis not present

## 2023-05-18 DIAGNOSIS — G4486 Cervicogenic headache: Secondary | ICD-10-CM

## 2023-05-18 DIAGNOSIS — G43109 Migraine with aura, not intractable, without status migrainosus: Secondary | ICD-10-CM | POA: Diagnosis not present

## 2023-05-18 DIAGNOSIS — I10 Essential (primary) hypertension: Secondary | ICD-10-CM

## 2023-05-18 DIAGNOSIS — H539 Unspecified visual disturbance: Secondary | ICD-10-CM | POA: Diagnosis not present

## 2023-05-18 DIAGNOSIS — H4911 Fourth [trochlear] nerve palsy, right eye: Secondary | ICD-10-CM | POA: Diagnosis not present

## 2023-05-18 DIAGNOSIS — R519 Headache, unspecified: Secondary | ICD-10-CM

## 2023-05-18 MED ORDER — GABAPENTIN 100 MG PO CAPS: 200.0000 mg | ORAL_CAPSULE | Freq: Every day | ORAL | 2 refills | Status: DC

## 2023-05-18 NOTE — Patient Instructions (Signed)
A few things to remember from today's visit:  Headache, unspecified headache type - Plan: Ambulatory referral to Neurology  Essential hypertension [place] Gabapentin a 200 mg a la hora de la cama. Cita con el neurologo esta en poroceso. Tomece la presion arterial cada 1-2 semanas. Siga con Tramadol cuando lo necesite.  If you need refills for medications you take chronically, please call your pharmacy. Do not use My Chart to request refills or for acute issues that need immediate attention. If you send a my chart message, it may take a few days to be addressed, specially if I am not in the office.  Please be sure medication list is accurate. If a new problem present, please set up appointment sooner than planned today.

## 2023-05-18 NOTE — Assessment & Plan Note (Signed)
BP adequately controlled today. Recent elevated BP could have been caused by pain. Continue  Amlodipine 5 mg daily and Metoprolol succinate 25 mg daily as well as low salt diet. Continue monitoring BP regularly.

## 2023-05-21 NOTE — Assessment & Plan Note (Signed)
She is not sure if Gabapentin is helping, agrees with increasing dose from 100 mg to 200 mg at bedtime. I do not think imaging imaging is needed at this time. New referral to neurologist placed to see Dr.Jaffe. Instructed about warning signs.

## 2023-05-25 ENCOUNTER — Other Ambulatory Visit: Payer: Self-pay | Admitting: Cardiovascular Disease

## 2023-05-25 ENCOUNTER — Other Ambulatory Visit (HOSPITAL_BASED_OUTPATIENT_CLINIC_OR_DEPARTMENT_OTHER): Payer: Self-pay | Admitting: Cardiovascular Disease

## 2023-05-25 DIAGNOSIS — I480 Paroxysmal atrial fibrillation: Secondary | ICD-10-CM

## 2023-05-26 NOTE — Telephone Encounter (Signed)
Warfarin 2mg  refill S/P minimally-invasive mitral valve repair  Last INR 04/28/23 Last OV 09/18/22

## 2023-06-02 ENCOUNTER — Encounter: Payer: Self-pay | Admitting: Family Medicine

## 2023-06-05 ENCOUNTER — Ambulatory Visit
Admission: RE | Admit: 2023-06-05 | Discharge: 2023-06-05 | Disposition: A | Discharge status: Home / Self Care | Payer: Medicare HMO | Source: Ambulatory Visit | Attending: Family Medicine | Admitting: Family Medicine

## 2023-06-05 DIAGNOSIS — M545 Low back pain, unspecified: Secondary | ICD-10-CM

## 2023-06-05 DIAGNOSIS — M5126 Other intervertebral disc displacement, lumbar region: Secondary | ICD-10-CM | POA: Diagnosis not present

## 2023-06-05 DIAGNOSIS — M4316 Spondylolisthesis, lumbar region: Secondary | ICD-10-CM | POA: Diagnosis not present

## 2023-06-05 DIAGNOSIS — M48061 Spinal stenosis, lumbar region without neurogenic claudication: Secondary | ICD-10-CM | POA: Diagnosis not present

## 2023-06-08 NOTE — Progress Notes (Unsigned)
NEUROLOGY FOLLOW UP OFFICE NOTE  [name] [name] [id number]  Assessment/Plan:   Cervicogenic headache with cervical spondylosis  Secondary stroke prevention as managed by PCP and cardiology: Coumadin (INR 2-3) and ASA 81mg  daily Atorvastatin 40mg  daily.  LDL goal less than 70 Normotensive blood pressure Hgb A1c goal less than 7 Follow up with neuro-ophthalmology - referral sent. For headaches, increase gabapentin to 100mg  in morning and 200mg  at bedtime Follow up 6 months.   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  UPDATE: Last seen in September 2022.  At that time, she was taking gabapentin 200mg  at bedtime, which she continues to take today.  ***  Cervical spine X-ray on 10/14/2022 showed C3-C5 ACDF and multilevel degenerative changes at C4-5, C5-6 and C6-7.  Current medications:  gabapentin 200mg  at bedtime  HISTORY: Initially saw her for headache in 2020: Onset:  She has history of migraines for many years.  She presents for this different headache which began in her 66s. Location:  Left greater than right temple Quality:  Electric shock Intensity:  10/10.  She denies new headache, thunderclap headache or severe headache that wakes her from sleep. Associated symptoms:  Usually no associated symptoms.  Sometimes there is associated conjunctival injection on side of pain.  Photophobia as well.  No associated neck pain.  She denies associated phonophobia, osmophobia, nausea, vomiting, visual disturbance or unilateral numbness or weakness. Duration:  5 seconds Frequency:  2 to 3 times a day and it may occur again in 2 to 4 weeks. Frequency of abortive medication: rarely Triggers:  Possibly sweet foods Relieving factors: Tylenol, Fioricet Activity:  No CT head on 11/15/2019 Showed calcified 1.2 x 1.3 cm meningioma over the left frontal  convexities.   Cervicalgia: Onset 2021.  It is a dull persistent bilateral pain in back of neck radiating up both occipital regions to top of head.  Pain aggravated turning head to either side or looking up.  She feels a soft "bump" on both sides of posterior head. No paresthesias on back of head.  No radicular pain down the arms.  No preceding neck/head trauma or strenuous activity involving the neck.  She denies prior history of neck pain.  She has been treating with Tylenol and gabapentin.  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     Past NSAIDS:  ibuprofen  Past analgesics:  tramadol Past antihypertensive medications:  clonidine Past antidepressant medications:  none Past anticonvulsant medications:  gabapentin   Family history of headache:  No   Neuralgia, probably residual from prior TMJ inflammation  She was admitted to [facility] on 07/29/2021 for stroke-like symptoms.  She presented for binocular vertical diplopia.  No speech disturbance, facial droop, vertigo, ataxia or unilateral numbness or weakness.  IV t-PA not given as she was outside the window.  CT head showed stable left frontal meningioma but no acute stroke.  CTA of head and neck negative for LVO or hemodynamically significant stenosis.  MRI of brain was negative for acute stroke but DWI-negative brainstem infarct was suspected.  LDL was 95 and Hgb A1c was 5.6.  Echocardiogram performed in August showed EF 40-45%.  Was already on Coumadin for MVR with appendage clipping in June.  She was continued on Coumadin and ASA 81mg  daily.  Lipitor was increased from  20mg  to 40mg  daily.  Still with vertical diplopia.  Cervical X-ray showed degenerative changes, also noted on CTA of neck.  Gabapentin 200mg  at bedtime not effective.  PAST MEDICAL HISTORY: Past Medical History:  Diagnosis Date   Arthritis    Atypical chest pain    Diverticulosis    Gastritis  11/08/2007, 04/12/2012   H pylori bx neg 03/2012   GERD (gastroesophageal reflux disease) 11/08/2007, 04/12/2012   Hepatic cyst    Hiatal hernia 11/08/2007, 04/12/2012   History of kidney stones    Hyperlipidemia    Hypertension    IBS (irritable bowel syndrome)    diarrhea predom   Migraine    Mitral regurgitation    Osteoporosis    DEXA 01/21/2012: -2.1 spine and R fem, -1.8 L fem s/p fosamax  2004-2011 DEXA 04/18/14 @ [facility]: -2.5 - rec to start Prolia     PVC's (premature ventricular contractions)    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     amLODipine (NORVASC) 5 MG tablet Take 1 tablet (5 mg total) by mouth daily. 90 tablet 3   aspirin EC 81 MG EC tablet Take 1 tablet (81 mg total) by mouth daily. Swallow whole. 30 tablet 11   atorvastatin (LIPITOR) 40 MG tablet TAKE ONE TABLET BY MOUTH ONCE DAILY 90 tablet 3   carbamide peroxide (DEBROX) 6.5 % OTIC solution Place 5 drops into the left ear 2 (two) times daily. 15 mL 0   cloNIDine (CATAPRES) 0.1 MG tablet Take 1 tablet (0.1 mg total) by mouth daily as needed. If blood pressure 160/90 or higher. 30 tablet 1   Cyanocobalamin (VITAMIN B-12) 2500 MCG SUBL Place 2,500 mcg under the tongue daily.     dicyclomine (BENTYL) 10 MG capsule Take 1 capsule (10 mg total) by mouth 2 (two) times daily. 60 capsule 3   diltiazem 2 % GEL Apply 1 Application topically 3 (three) times daily. Apply a pea sized amount internally (rectally) three times daily. 30 g 1   famotidine (PEPCID) 20 MG  tablet Take 1 tablet (20 mg total) by mouth daily. 30 tablet 3   gabapentin (NEURONTIN) 100 MG capsule Take 2 capsules (200 mg total) by mouth at bedtime. 60 capsule 2   methylcellulose (CITRUCEL) oral powder Take 1 packet by mouth daily. 30 g 1   metoprolol succinate (TOPROL-XL) 25 MG 24 hr tablet TAKE ONE TABLET BY MOUTH EVERY MORNING & TAKE ONE TABLET BY MOUTH EVERY EVENING 180 tablet 2   Peppermint Oil (IBGARD) 90 MG CPCR Use as needed (Patient taking differently: Take 90 mg by mouth daily as needed (Stomach control). Use as needed)     Polyethyl Glycol-Propyl Glycol (SYSTANE) 0.4-0.3 % SOLN Place 1 drop into both eyes 2 (two) times daily.     traMADol (ULTRAM) 50 MG tablet Take 0.5-1 tablets (25-50 mg total) by mouth every 12 (twelve) hours as needed. 30 tablet 0   warfarin (COUMADIN) 2 MG tablet Take 1 to 1 and 1/2 tablets by mouth once daily as directed by coumadin clinic. 120 tablet  0   No current facility-administered medications on file prior to visit.    ALLERGIES: No Known Allergies  FAMILY HISTORY: Family History  Problem Relation Age of Onset   Hypertension Mother    Alzheimer's disease Mother 28   AAA (abdominal aortic aneurysm) Mother    Stroke Father 18   Kidney cancer Brother        mets   Bone cancer Brother 14   Colon cancer Neg Hx       Objective:  *** General: No acute distress.  Patient appears well-groomed.   Head:  Normocephalic/atraumatic Eyes:  Fundi examined but not visualized Neck: supple, no paraspinal tenderness, full range of motion Heart:  Regular rate and rhythm Neurological Exam: alert and oriented to person, place, and time.  Speech fluent and not dysarthric, language intact.  Eye movements grossly intact.  Reports worsening diplopia head tilt to right and downward gaze and improved with head tilt to left.  Otherwise, CN II-XII intact. Bulk and tone normal, muscle strength 5/5 throughout.  Sensation to light touch intact.  Deep tendon reflexes  2+ throughout, toes downgoing.  Finger to nose testing intact.  Gait normal, Romberg negative.   Shon Millet, DO  CC: Cheryll Cockayne, MD

## 2023-06-08 NOTE — Progress Notes (Unsigned)
[name] [address] [place] [address] Phone: [phone] Subjective:   [name] [name], am serving as a scribe for Dr. Antoine Primas.  I'm seeing this patient by the request  of:  [place], Betty G, MD  CC: right hand low back pain follow up   [id number]  04/02/2023 Patient is now having more midline pain.  Concern for potential compression fracture with midline discomfort.  In addition to this though patient is also now having radicular symptoms with weakness of the right foot.  Do feel an MRI would be beneficial.  Depending on findings we will see if patient is a candidate for the possibility of epidural steroid injections versus possibility of other treatment and allergies.  Patient will follow-up after imaging to discuss further     Repeated the A1 pulley not at the A2 pulley.  This would hopefully be more beneficial and likely contributing to some of the triggering noted at this time.  Discussed icing regimen and home exercises otherwise.  Increase activity slowly.  Follow-up again in 6 to 8 weeks     Chronic problem with worsening symptoms.  Repeat injection given again today.  Patient will do bracing at night if necessary.  Wants to hold on any type of surgical intervention but has discussed with hand surgeon.  Follow-up with me again 10 to 12 weeks      Update 06/09/2023 [name] [name] is a 77 y.o. female coming in with complaint of R hand and LBP. Patient states discuss MRI. Has appointment with hand surgeon in about 2 weeks.       Past Medical History:  Diagnosis Date   Arthritis    Atypical chest pain    Diverticulosis    Gastritis 11/08/2007, 04/12/2012   H pylori bx neg 03/2012   GERD (gastroesophageal reflux disease) 11/08/2007, 04/12/2012   Hepatic cyst    Hiatal hernia 11/08/2007, 04/12/2012   History of kidney stones    Hyperlipidemia    Hypertension    IBS (irritable bowel syndrome)    diarrhea predom   Migraine     Mitral regurgitation    Osteoporosis    DEXA 01/21/2012: -2.1 spine and R fem, -1.8 L fem s/p fosamax  2004-2011 DEXA 04/18/14 @ [facility]: -2.5 - rec to start Prolia     PVC's (premature ventricular contractions)    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   Complex valvuloplasty including PTFE neochord placement x6 with 30 mm Medtronic Simuform ring annuloplasty with clipping of LA appendage   Schatzki's ring 11/11/2010   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   Shingles outbreak 04/2013   Past Surgical History:  Procedure Laterality Date   ANTERIOR CERVICAL DECOMP/DISCECTOMY FUSION N/A 05/06/2022   Procedure: CERVICAL THREE-FOUR, CERVICAL FOUR-FIVE ANTERIOR CERVICAL DECOMPRESSION/DISCECTOMY FUSION;  Surgeon: Julio Sicks, MD;  Location: [facility] OR;  Service: Neurosurgery;  Laterality: N/A;   APPENDECTOMY  1958   BUBBLE STUDY  03/14/2021   Procedure: BUBBLE STUDY;  Surgeon: Quintella Reichert, MD;  Location: [facility] ENDOSCOPY;  Service: Cardiovascular;;   CARDIAC CATHETERIZATION     CLIPPING OF ATRIAL APPENDAGE N/A 05/21/2021   Procedure: CLIPPING OF ATRIAL APPENDAGE USING ATRICURE PRO2 CLIP;  Surgeon: Purcell Nails, MD;  Location: [facility] OR;  Service: Open Heart Surgery;  Laterality: N/A;   COLONOSCOPY     CYSTOCELE REPAIR  2008   ESOPHAGOGASTRODUODENOSCOPY     Maxilofacial  1992   MITRAL VALVE REPAIR  05/21/2021   Procedure: MINIMALLY  INVASIVE MITRAL VALVE REPAIR (MVR) USING MEDTRONIC SIMUFORM RING;  Surgeon: Purcell Nails, MD;  Location[facility] OR;  Service: Open Heart Surgery;;   RIGHT/LEFT HEART CATH AND CORONARY ANGIOGRAPHY N/A 03/14/2021   Procedure: RIGHT/LEFT HEART CATH AND CORONARY ANGIOGRAPHY;  Surgeon: Runell Gess, MD;  Location: [facility] INVASIVE CV LAB;  Service: Cardiovascular;  Laterality: N/A;   TEE WITHOUT CARDIOVERSION N/A 03/14/2021   Procedure: TRANSESOPHAGEAL ECHOCARDIOGRAM (TEE);  Surgeon: Quintella Reichert, MD;  Location: [facility] ENDOSCOPY;  Service: Cardiovascular;   Laterality: N/A;   TEE WITHOUT CARDIOVERSION N/A 05/21/2021   Procedure: TRANSESOPHAGEAL ECHOCARDIOGRAM (TEE);  Surgeon: Purcell Nails, MD;  Location: [facility] OR;  Service: Open Heart Surgery;  Laterality: N/A;   TONSILLECTOMY  1960   Social History   Socioeconomic History   Marital status: Married    Spouse name: Madaline Guthrie   Number of children: 3   Years of education: Not on file   Highest education level: Master's degree (e.g., MA, MS, MEng, MEd, MSW, MBA)  Occupational History   Occupation: retired  Tobacco Use   Smoking status: Never   Smokeless tobacco: Never  Vaping Use   Vaping status: Never Used  Substance and Sexual Activity   Alcohol use: No   Drug use: No   Sexual activity: Not on file  Other Topics Concern   Not on file  Social History Narrative   Married, lives with spouse. Retired Comptroller, Manufacturing systems engineer. Has 3 kids (moved to [place] to be closer)- youngest son MD. [place] Ham to [place] from [place] 06/2010, lived in [place] x 10years   Social Determinants of Health   Financial Resource Strain: Low Risk  (05/15/2023)   Overall Financial Resource Strain (CARDIA)    Difficulty of Paying Living Expenses: Not hard at all  Food Insecurity: No Food Insecurity (05/15/2023)   Hunger Vital Sign    Worried About Running Out of Food in the Last Year: Never true    Ran Out of Food in the Last Year: Never true  Transportation Needs: No Transportation Needs (05/15/2023)   PRAPARE - Administrator, Civil Service (Medical): No    Lack of Transportation (Non-Medical): No  Physical Activity: Insufficiently Active (05/15/2023)   Exercise Vital Sign    Days of Exercise per Week: 3 days    Minutes of Exercise per Session: 30 min  Stress: No Stress Concern Present (05/15/2023)   [employer] of Occupational Health - Occupational Stress Questionnaire    Feeling of Stress : Not at all  Social Connections: Socially Integrated (05/15/2023)   Social Connection and Isolation Panel  [NHANES]    Frequency of Communication with Friends and Family: More than three times a week    Frequency of Social Gatherings with Friends and Family: More than three times a week    Attends Religious Services: More than 4 times per year    Active Member of [employer] or Organizations: Yes    Attends Engineer, structural: More than 4 times per year    Marital Status: Married   No Known Allergies Family History  Problem Relation Age of Onset   Hypertension Mother    Alzheimer's disease Mother 24   AAA (abdominal aortic aneurysm) Mother    Stroke Father 27   Kidney cancer Brother        mets   Bone cancer Brother 19   Colon cancer Neg Hx      Current Outpatient Medications (Cardiovascular):    amLODipine (NORVASC) 5  MG tablet, Take 1 tablet (5 mg total) by mouth daily.   atorvastatin (LIPITOR) 40 MG tablet, TAKE ONE TABLET BY MOUTH ONCE DAILY   cloNIDine (CATAPRES) 0.1 MG tablet, Take 1 tablet (0.1 mg total) by mouth daily as needed. If blood pressure 160/90 or higher.   diltiazem 2 % GEL*, Apply 1 Application topically 3 (three) times daily. Apply a pea sized amount internally (rectally) three times daily.   metoprolol succinate (TOPROL-XL) 25 MG 24 hr tablet, TAKE ONE TABLET BY MOUTH EVERY MORNING & TAKE ONE TABLET BY MOUTH EVERY EVENING   Current Outpatient Medications (Analgesics):    acetaminophen (TYLENOL) 650 MG CR tablet, Take 650 mg by mouth every 8 (eight) hours as needed for pain (Headache).   aspirin EC 81 MG EC tablet, Take 1 tablet (81 mg total) by mouth daily. Swallow whole.   traMADol (ULTRAM) 50 MG tablet, Take 0.5-1 tablets (25-50 mg total) by mouth every 12 (twelve) hours as needed.  Current Outpatient Medications (Hematological):    Cyanocobalamin (VITAMIN B-12) 2500 MCG SUBL, Place 2,500 mcg under the tongue daily.   warfarin (COUMADIN) 2 MG tablet, Take 1 to 1 and 1/2 tablets by mouth once daily as directed by coumadin clinic.  Current Outpatient  Medications (Other):    carbamide peroxide (DEBROX) 6.5 % OTIC solution, Place 5 drops into the left ear 2 (two) times daily.   dicyclomine (BENTYL) 10 MG capsule, Take 1 capsule (10 mg total) by mouth 2 (two) times daily.   diltiazem 2 % GEL*, Apply 1 Application topically 3 (three) times daily. Apply a pea sized amount internally (rectally) three times daily.   famotidine (PEPCID) 20 MG tablet, Take 1 tablet (20 mg total) by mouth daily.   gabapentin (NEURONTIN) 100 MG capsule, Take 2 capsules (200 mg total) by mouth at bedtime.   methylcellulose (CITRUCEL) oral powder, Take 1 packet by mouth daily.   Peppermint Oil (IBGARD) 90 MG CPCR, Use as needed (Patient taking differently: Take 90 mg by mouth daily as needed (Stomach control). Use as needed)   Polyethyl Glycol-Propyl Glycol (SYSTANE) 0.4-0.3 % SOLN, Place 1 drop into both eyes 2 (two) times daily. * These medications belong to multiple therapeutic classes and are listed under each applicable group.   Reviewed prior external information including notes and imaging from  primary care provider As well as notes that were available from care everywhere and other healthcare systems.  Past medical history, social, surgical and family history all reviewed in electronic medical record.  No pertanent information unless stated regarding to the chief complaint.   Review of Systems:  No headache, visual changes, nausea, vomiting, diarrhea, constipation, dizziness, abdominal pain, skin rash, fevers, chills, night sweats, weight loss, swollen lymph nodes,  joint swelling, chest pain, shortness of breath, mood changes. POSITIVE muscle aches, body aches  Objective  Blood pressure 122/82, pulse 72, height 5\' 3"  (1.6 m), weight 136 lb (61.7 kg), SpO2 95%.   General: No apparent distress alert and oriented x3 mood and affect normal, dressed appropriately.  HEENT: Pupils equal, extraocular movements intact  Respiratory: Patient's speak in full sentences  and does not appear short of breath  Cardiovascular: No lower extremity edema, non tender, no erythema  Patient is sitting relatively comfortably.  Worsening pain with extension.  4-5 strength in the lower extremities.  Does have weakness noted with dorsi flexion of the feet bilaterally.    Impression and Recommendations:     The above documentation has been reviewed and  is accurate and complete Judi Saa, DO

## 2023-06-09 ENCOUNTER — Ambulatory Visit: Payer: Medicare HMO | Admitting: Family Medicine

## 2023-06-09 ENCOUNTER — Encounter: Payer: Self-pay | Admitting: Neurology

## 2023-06-09 ENCOUNTER — Encounter: Payer: Self-pay | Admitting: Family Medicine

## 2023-06-09 ENCOUNTER — Ambulatory Visit: Payer: Medicare HMO | Admitting: Neurology

## 2023-06-09 ENCOUNTER — Ambulatory Visit: Payer: Self-pay

## 2023-06-09 ENCOUNTER — Ambulatory Visit: Payer: Medicare HMO | Attending: Cardiovascular Disease | Admitting: *Deleted

## 2023-06-09 VITALS — BP 124/71 | HR 69 | Ht 63.0 in | Wt 137.8 lb

## 2023-06-09 VITALS — BP 122/82 | HR 72 | Ht 63.0 in | Wt 136.0 lb

## 2023-06-09 DIAGNOSIS — Z7901 Long term (current) use of anticoagulants: Secondary | ICD-10-CM | POA: Diagnosis not present

## 2023-06-09 DIAGNOSIS — M545 Low back pain, unspecified: Secondary | ICD-10-CM | POA: Diagnosis not present

## 2023-06-09 DIAGNOSIS — M48062 Spinal stenosis, lumbar region with neurogenic claudication: Secondary | ICD-10-CM

## 2023-06-09 DIAGNOSIS — G4486 Cervicogenic headache: Secondary | ICD-10-CM | POA: Diagnosis not present

## 2023-06-09 DIAGNOSIS — M5481 Occipital neuralgia: Secondary | ICD-10-CM

## 2023-06-09 DIAGNOSIS — I340 Nonrheumatic mitral (valve) insufficiency: Secondary | ICD-10-CM | POA: Diagnosis not present

## 2023-06-09 DIAGNOSIS — M79641 Pain in right hand: Secondary | ICD-10-CM

## 2023-06-09 DIAGNOSIS — Z9889 Other specified postprocedural states: Secondary | ICD-10-CM

## 2023-06-09 HISTORY — DX: Spinal stenosis, lumbar region with neurogenic claudication: M48.062

## 2023-06-09 LAB — POCT INR: INR: 2.7 (ref 2.0–3.0)

## 2023-06-09 MED ORDER — GABAPENTIN 100 MG PO CAPS
200.0000 mg | ORAL_CAPSULE | Freq: Every day | ORAL | 5 refills | Status: DC
Start: 2023-06-09 — End: 2023-12-21

## 2023-06-09 NOTE — Patient Instructions (Signed)
Increase gabapentin to 2 pills at bedtime.  If no significant improvement in 3 weeks, contact me and we will submit prior authorization to your insurance to get occipital nerve block Limit use of pain relievers (such as Tylenol) to no more than 2 days out of week to prevent risk of rebound or medication-overuse headache. Keep headache diary Follow up 5 months

## 2023-06-09 NOTE — Assessment & Plan Note (Signed)
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  Patient does have some facet arthropathy in the same area that we will continue to monitor as well.  Over read is pending.  At this moment I do feel an epidural would be beneficial.  Patient is on Coumadin and will need to get approval from cardiologist to either hold the Coumadin or do Lovenox bridge.  Patient will follow-up again in 6 weeks after the injection.

## 2023-06-09 NOTE — Patient Instructions (Addendum)
[facility] [phone] Call Today  See you again in 6-8 weeks after epidural

## 2023-06-09 NOTE — Patient Instructions (Signed)
Description   Continue taking warfarin 1.5 tablets daily except for 1 tablet on Wednesday. Recheck INR in 6 weeks. [facility]in Clinic [phone]; Procedure Fax Number [phone] or [phone]

## 2023-06-10 ENCOUNTER — Encounter: Payer: Self-pay | Admitting: Neurology

## 2023-06-15 ENCOUNTER — Telehealth: Payer: Self-pay

## 2023-06-15 DIAGNOSIS — R413 Other amnesia: Secondary | ICD-10-CM

## 2023-06-15 NOTE — Telephone Encounter (Signed)
Per Dr.Jaffe, We can order neuropsychological evaluation but our neuropsychologist is booking out a year, so I would place her on the wait list

## 2023-06-23 ENCOUNTER — Telehealth: Payer: Self-pay | Admitting: *Deleted

## 2023-06-23 ENCOUNTER — Telehealth: Payer: Self-pay

## 2023-06-23 NOTE — Telephone Encounter (Signed)
Pts last OV was 09/18/22 with Edd Fabian, NP.       Pre-operative Risk Assessment    Patient Name: SCHYLER SHELL  DOB: February 07, 1946 MRN: [id number]      Request for Surgical Clearance    Procedure:   Lumbar Epidural  Date of Surgery:  Clearance TBD                                 Surgeon:  [facility]  Surgeon's Group or Practice Name:  [facility] Phone number:  [phone]  Fax number:  [phone]   Type of Clearance Requested:   - Medical  - Pharmacy:  Hold Aspirin and Warfarin (Coumadin) Requesting office is asking pt hold Warfarin for 4 days prior to procedure   Type of Anesthesia:  Not Indicated   Additional requests/questions:    Wynetta Fines   06/23/2023, 10:43 AM

## 2023-06-23 NOTE — Telephone Encounter (Signed)
Pharmacy please advise on holding Coumadin prior to lumbar ESI scheduled for TBD. Thank you.

## 2023-06-23 NOTE — Telephone Encounter (Signed)
Pt called back and she has been scheduled for tele pre op appt 07/01/23 @ 1:40. Med rec and consent are done.      Patient Consent for Virtual Visit        [name] [name] has provided verbal consent on 06/23/2023 for a virtual visit (video or telephone).   CONSENT FOR VIRTUAL VISIT FOR:  [name] [name]  By participating in this virtual visit I agree to the following:  I hereby voluntarily request, consent and authorize [facility] and its employed or contracted physicians, physician assistants, nurse practitioners or other licensed health care professionals (the Practitioner), to provide me with telemedicine health care services (the "Services") as deemed necessary by the treating Practitioner. I acknowledge and consent to receive the Services by the Practitioner via telemedicine. I understand that the telemedicine visit will involve communicating with the Practitioner through live audiovisual communication technology and the disclosure of certain medical information by electronic transmission. I acknowledge that I have been given the opportunity to request an in-person assessment or other available alternative prior to the telemedicine visit and am voluntarily participating in the telemedicine visit.  I understand that I have the right to withhold or withdraw my consent to the use of telemedicine in the course of my care at any time, without affecting my right to future care or treatment, and that the Practitioner or I may terminate the telemedicine visit at any time. I understand that I have the right to inspect all information obtained and/or recorded in the course of the telemedicine visit and may receive copies of available information for a reasonable fee.  I understand that some of the potential risks of receiving the Services via telemedicine include:  Delay or interruption in medical evaluation due to technological equipment failure or disruption; Information transmitted  may not be sufficient (e.g. poor resolution of images) to allow for appropriate medical decision making by the Practitioner; and/or  In rare instances, security protocols could fail, causing a breach of personal health information.  Furthermore, I acknowledge that it is my responsibility to provide information about my medical history, conditions and care that is complete and accurate to the best of my ability. I acknowledge that Practitioner's advice, recommendations, and/or decision may be based on factors not within their control, such as incomplete or inaccurate data provided by me or distortions of diagnostic images or specimens that may result from electronic transmissions. I understand that the practice of medicine is not an exact science and that Practitioner makes no warranties or guarantees regarding treatment outcomes. I acknowledge that a copy of this consent can be made available to me via my patient portal [facility] MyChart), or I can request a printed copy by calling the office of [facility].    I understand that my insurance will be billed for this visit.   I have read or had this consent read to me. I understand the contents of this consent, which adequately explains the benefits and risks of the Services being provided via telemedicine.  I have been provided ample opportunity to ask questions regarding this consent and the Services and have had my questions answered to my satisfaction. I give my informed consent for the services to be provided through the use of telemedicine in my medical care

## 2023-06-23 NOTE — Telephone Encounter (Signed)
Left message to call back to pre op to set up tele pre op appt. I d/w pre op APP today Robin Searing, NP, to confirm if echo was going to need to be done before she can be cleared. Per pre op APP Robin Searing, NP echo is for yrly f/u, not needed for pre op clearance.

## 2023-06-23 NOTE — Telephone Encounter (Signed)
   Name: [name] [name]  DOB: Oct 18, 1946  MRN: [id number]  Primary Cardiologist: Nanetta Batty, MD   Preoperative team, please contact this patient and set up a phone call appointment for further preoperative risk assessment. Please obtain consent and complete medication review. Thank you for your help.  I confirm that guidance regarding antiplatelet and oral anticoagulation therapy has been completed and, if necessary, noted below.  Per office protocol, patient can hold warfarin for 5 days prior to procedure. Patient will need bridging with Lovenox around procedure. This can be coordinated at [facility] clinic where pt is followed. Will need to know procedure date once scheduled so that bridge can be coordinated.    Napoleon Form, Leodis Rains, NP 06/23/2023, 3:51 PM [facility]

## 2023-06-23 NOTE — Telephone Encounter (Signed)
Pt called back and she has been scheduled for tele pre op appt 07/01/23 @ 1:40. Med rec and consent are done.

## 2023-06-23 NOTE — Telephone Encounter (Addendum)
Patient with diagnosis of afib on warfarin for anticoagulation.    Procedure: lumbar ESI Date of procedure: TBD  CHA2DS2-VASc Score = 7  This indicates a 11.2% annual risk of stroke. The patient's score is based upon: CHF History: 1 HTN History: 1 Diabetes History: 0 Stroke History: 2 Vascular Disease History: 0 Age Score: 2 Gender Score: 1  CrCl 16mL/min Platelet count 220K  Per office protocol, patient can hold warfarin for 5 days prior to procedure. Patient will need bridging with Lovenox around procedure. This can be coordinated at [facility] clinic where pt is followed. Will need to know procedure date once scheduled so that bridge can be coordinated.  **This guidance is not considered finalized until pre-operative APP has relayed final recommendations.**

## 2023-06-24 NOTE — Telephone Encounter (Signed)
Patient returned staff call and stated she can do the tele-visit on 8/7 at 1:40 pm.

## 2023-06-29 NOTE — Progress Notes (Unsigned)
Virtual Visit via Telephone Note   Because of [name] [name]'s co-morbid illnesses, she is at least at moderate risk for complications without adequate follow up.  This format is felt to be most appropriate for this patient at this time.  The patient did not have access to video technology/had technical difficulties with video requiring transitioning to audio format only (telephone).  All issues noted in this document were discussed and addressed.  No physical exam could be performed with this format.  Please refer to the patient's chart for her consent to telehealth for [facility].  Evaluation Performed:  Preoperative cardiovascular risk assessment _____________   Date:  07/01/2023   Patient ID:  [name] [name], DOB December 09, 1945, MRN [id number] Patient Location:  Home Provider location:   Office  Primary Care Provider:  [place], Betty G, MD Primary Cardiologist:  Nanetta Batty, MD  Chief Complaint / Patient Profile   77 y.o. y/o female with a h/o essential hypertension, cardiomegaly, CHF, pleural effusion, GERD, gastritis, hyperlipidemia, Moderate to severe mitral regurgitation, chronic low back pain, prediabetes, chest pain, and cardiac murmur. who is pending Lumbar ESI on date TBD by [facility],and Right Thumb and Index A-1 Pulley per [facility]  and presents today for telephonic preoperative cardiovascular risk assessment.  History of Present Illness    [name] [name] is a 77 y.o. female who presents via audio/video conferencing for a telehealth visit today.  Pt was last seen in cardiology clinic on 09/18/2022  by Edd Fabian on 09/15/2022.  At that time [name] [name] was doing well   The patient is now pending procedure as outlined above. Since her last visit, she She has done well from a cardiac standpoint.  No chest pain or dyspnea. No bleeding issues,   Past Medical History    Past Medical History:  Diagnosis Date   Arthritis    Atypical  chest pain    Diverticulosis    Gastritis 11/08/2007, 04/12/2012   H pylori bx neg 03/2012   GERD (gastroesophageal reflux disease) 11/08/2007, 04/12/2012   Hepatic cyst    Hiatal hernia 11/08/2007, 04/12/2012   History of kidney stones    Hyperlipidemia    Hypertension    IBS (irritable bowel syndrome)    diarrhea predom   Migraine    Mitral regurgitation    Osteoporosis    DEXA 01/21/2012: -2.1 spine and R fem, -1.8 L fem s/p fosamax  2004-2011 DEXA 04/18/14 @ [facility]: -2.5 - rec to start Prolia     PVC's (premature ventricular contractions)    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   Complex valvuloplasty including PTFE neochord placement x6 with 30 mm Medtronic Simuform ring annuloplasty with clipping of LA appendage   Schatzki's ring 11/11/2010   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   Shingles outbreak 04/2013   Past Surgical History:  Procedure Laterality Date   ANTERIOR CERVICAL DECOMP/DISCECTOMY FUSION N/A 05/06/2022   Procedure: CERVICAL THREE-FOUR, CERVICAL FOUR-FIVE ANTERIOR CERVICAL DECOMPRESSION/DISCECTOMY FUSION;  Surgeon: Julio Sicks, MD;  Location: [facility] OR;  Service: Neurosurgery;  Laterality: N/A;   APPENDECTOMY  1958   BUBBLE STUDY  03/14/2021   Procedure: BUBBLE STUDY;  Surgeon: Quintella Reichert, MD;  Location: [facility] ENDOSCOPY;  Service: Cardiovascular;;   CARDIAC CATHETERIZATION     CLIPPING OF ATRIAL APPENDAGE N/A 05/21/2021   Procedure: CLIPPING OF ATRIAL APPENDAGE USING ATRICURE PRO2 CLIP;  Surgeon: Purcell Nails, MD;  Location: [facility] OR;  Service: Open Heart Surgery;  Laterality: N/A;  COLONOSCOPY     CYSTOCELE REPAIR  2008   ESOPHAGOGASTRODUODENOSCOPY     Maxilofacial  1992   MITRAL VALVE REPAIR  05/21/2021   Procedure: MINIMALLY INVASIVE MITRAL VALVE REPAIR (MVR) USING MEDTRONIC SIMUFORM RING;  Surgeon: Purcell Nails, MD;  Location: [facility] OR;  Service: Open Heart Surgery;;   RIGHT/LEFT HEART CATH AND CORONARY ANGIOGRAPHY N/A 03/14/2021    Procedure: RIGHT/LEFT HEART CATH AND CORONARY ANGIOGRAPHY;  Surgeon: Runell Gess, MD;  Location: [facility] INVASIVE CV LAB;  Service: Cardiovascular;  Laterality: N/A;   TEE WITHOUT CARDIOVERSION N/A 03/14/2021   Procedure: TRANSESOPHAGEAL ECHOCARDIOGRAM (TEE);  Surgeon: Quintella Reichert, MD;  Location: [facility] ENDOSCOPY;  Service: Cardiovascular;  Laterality: N/A;   TEE WITHOUT CARDIOVERSION N/A 05/21/2021   Procedure: TRANSESOPHAGEAL ECHOCARDIOGRAM (TEE);  Surgeon: Purcell Nails, MD;  Location: [facility] OR;  Service: Open Heart Surgery;  Laterality: N/A;   TONSILLECTOMY  1960    Allergies  No Known Allergies  Home Medications    Prior to Admission medications   Medication Sig Start Date End Date Taking? Authorizing Provider  acetaminophen (TYLENOL) 650 MG CR tablet Take 650 mg by mouth every 8 (eight) hours as needed for pain (Headache).    [provider]  amLODipine (NORVASC) 5 MG tablet Take 1 tablet (5 mg total) by mouth daily. 05/26/23   Runell Gess, MD  aspirin EC 81 MG EC tablet Take 1 tablet (81 mg total) by mouth daily. Swallow whole. 05/26/21   Barrett, Erin R, PA-C  atorvastatin (LIPITOR) 40 MG tablet TAKE ONE TABLET BY MOUTH ONCE DAILY 10/13/22   Runell Gess, MD  carbamide peroxide (DEBROX) 6.5 % OTIC solution Place 5 drops into the left ear 2 (two) times daily. 07/01/22   [place], Betty G, MD  cloNIDine (CATAPRES) 0.1 MG tablet Take 1 tablet (0.1 mg total) by mouth daily as needed. If blood pressure 160/90 or higher. 12/30/22   [place], Betty G, MD  Cyanocobalamin (VITAMIN B-12) 2500 MCG SUBL Place 2,500 mcg under the tongue daily.    [provider]  dicyclomine (BENTYL) 10 MG capsule Take 1 capsule (10 mg total) by mouth 2 (two) times daily. 09/17/22   Meredith Pel, NP  diltiazem 2 % GEL Apply 1 Application topically 3 (three) times daily. Apply a pea sized amount internally (rectally) three times daily. 03/10/23   Armbruster, Willaim Rayas, MD  famotidine (PEPCID)  20 MG tablet Take 1 tablet (20 mg total) by mouth daily. 03/10/23   Armbruster, Willaim Rayas, MD  gabapentin (NEURONTIN) 100 MG capsule Take 2 capsules (200 mg total) by mouth at bedtime. 06/09/23   Drema Dallas, DO  methylcellulose (CITRUCEL) oral powder Take 1 packet by mouth daily. 03/10/23   Armbruster, Willaim Rayas, MD  metoprolol succinate (TOPROL-XL) 25 MG 24 hr tablet TAKE ONE TABLET BY MOUTH EVERY MORNING & TAKE ONE TABLET BY MOUTH EVERY EVENING 05/25/23   Runell Gess, MD  Peppermint Oil (IBGARD) 90 MG CPCR Use as needed Patient taking differently: Take 90 mg by mouth daily as needed (Stomach control). Use as needed 10/30/21   Armbruster, Willaim Rayas, MD  Polyethyl Glycol-Propyl Glycol (SYSTANE) 0.4-0.3 % SOLN Place 1 drop into both eyes 2 (two) times daily.    [provider]  warfarin (COUMADIN) 2 MG tablet Take 1 to 1 and 1/2 tablets by mouth once daily as directed by coumadin clinic. 05/26/23   Runell Gess, MD    Physical Exam  Vital Signs:  [name] [name] does not have vital signs available for review today.  Given telephonic nature of communication, physical exam is limited. AAOx3. NAD. Normal affect.  Speech and respirations are unlabored.  Accessory Clinical Findings    None  Assessment & Plan    1.  Preoperative Cardiovascular Risk Assessment:  According to the Revised Cardiac Risk Index (RCRI), her Perioperative Risk of Major Cardiac Event is (%): 0.9  Her Functional Capacity in METs is: 8.97 according to the Duke Activity Status Index (DASI).   The patient was advised that if she develops new symptoms prior to surgery to contact our office to arrange for a follow-up visit, and she verbalized understanding.  Therefore, based on ACC/AHA guidelines, patient would be at acceptable risk for the planned procedure without further cardiovascular testing. I will route this recommendation to the requesting party via Epic fax function.   Per office protocol, patient  can hold warfarin for 5 days prior to procedure. Patient will need bridging with Lovenox around procedure. This can be coordinated at [facility] Coumadin clinic where pt is followed. Will need to know procedure date once scheduled so that bridge can be coordinated.   A copy of this note will be routed to requesting surgeon.  Time:   Today, I have spent 10 minutes with the patient with telehealth technology discussing medical history, symptoms, and management plan.     Joni Reining, NP  07/01/2023, 1:44 PM

## 2023-06-30 ENCOUNTER — Telehealth: Payer: Self-pay

## 2023-06-30 DIAGNOSIS — M65321 Trigger finger, right index finger: Secondary | ICD-10-CM | POA: Diagnosis not present

## 2023-06-30 DIAGNOSIS — M65311 Trigger thumb, right thumb: Secondary | ICD-10-CM | POA: Diagnosis not present

## 2023-06-30 NOTE — Telephone Encounter (Signed)
Pt has a VV 08/07 for preop clearance for another procedure.      Pre-operative Risk Assessment    [name]  DOB: 1946/07/04 MRN: [id number]      Request for Surgical Clearance    Procedure:   Right Thumb and Sunday Shams A1 Pulley Release  Date of Surgery:  Clearance TBD                                 Surgeon:  Waylan Rocher, MD Surgeon's Group or Practice Name:  [facility] Phone number:  [phone] Fax number:  [phone] ATTN Kerri Maze   Type of Clearance Requested:   - Medical  - Pharmacy:  Hold Warfarin (Coumadin) per Dr. Frazier Butt pt DOES NOT need to stop Coumadin   Type of Anesthesia:  Not Indicated   Additional requests/questions:    Signed, Zada Finders   06/30/2023, 11:17 AM

## 2023-06-30 NOTE — Telephone Encounter (Signed)
Pt is scheduled for 8/7.

## 2023-06-30 NOTE — Telephone Encounter (Signed)
   Name: [name] [name]  DOB: 11/06/1946  MRN: [id number]  Primary Cardiologist: Nanetta Batty, MD  Chart reviewed as part of pre-operative protocol coverage. Because of [name] [name]'s past medical history and time since last visit, she will require a follow-up telephone visit t in order to better assess preoperative cardiovascular risk.  Patient was already scheduled with Pre-op APP on 07/01/2023 for a virtual visit. Appointment notes have been updated to reflect need for pre-op evaluation for 2 different procedures.   Coumadin will NOT need to be held for right thumb and index A1 pulley release per Dr. Frazier Butt.    Carlos Levering, NP  06/30/2023, 11:22 AM

## 2023-07-01 ENCOUNTER — Ambulatory Visit: Payer: Medicare HMO | Attending: Cardiology

## 2023-07-01 DIAGNOSIS — Z01810 Encounter for preprocedural cardiovascular examination: Secondary | ICD-10-CM

## 2023-07-03 ENCOUNTER — Encounter: Payer: Self-pay | Admitting: Cardiovascular Disease

## 2023-07-03 NOTE — Telephone Encounter (Signed)
After reviewing the two clearance request in the chart for this pt. In regard to the Thumb surgery with Dr. Frazier Butt states no need to hold coumadin. See the clearance notes 06/30/23  The other surgery is for an epidural injection and will need to hold coumadin. See the clearance notes 06/23/23.   Looks like the pt is going to need Lovenox bridging. I am going to forward this to our anticoag clinic. They will need to reach out to the pt.

## 2023-07-03 NOTE — Telephone Encounter (Signed)
Spoke with patient, gave her phone # for coumadin clinic.  She understands to call them once she has a date for the epidural, to make sure bridging can be arranged.

## 2023-07-03 NOTE — Telephone Encounter (Signed)
Error

## 2023-07-03 NOTE — Telephone Encounter (Signed)
Patient called wanting to know when she should hold her coumadin.

## 2023-07-06 ENCOUNTER — Ambulatory Visit (HOSPITAL_COMMUNITY): Payer: Medicare HMO

## 2023-07-06 ENCOUNTER — Other Ambulatory Visit: Payer: Self-pay

## 2023-07-06 DIAGNOSIS — I340 Nonrheumatic mitral (valve) insufficiency: Secondary | ICD-10-CM

## 2023-07-06 NOTE — Progress Notes (Signed)
Reordered echo due to patient needing to reschedule and order expires at new scheduled time.

## 2023-07-16 DIAGNOSIS — M65311 Trigger thumb, right thumb: Secondary | ICD-10-CM | POA: Diagnosis not present

## 2023-07-16 DIAGNOSIS — M65321 Trigger finger, right index finger: Secondary | ICD-10-CM | POA: Diagnosis not present

## 2023-07-20 ENCOUNTER — Ambulatory Visit: Payer: Medicare HMO | Admitting: Neurology

## 2023-07-21 ENCOUNTER — Ambulatory Visit: Payer: Medicare HMO | Attending: Cardiovascular Disease

## 2023-07-21 ENCOUNTER — Ambulatory Visit (HOSPITAL_BASED_OUTPATIENT_CLINIC_OR_DEPARTMENT_OTHER): Payer: Medicare HMO

## 2023-07-21 DIAGNOSIS — I340 Nonrheumatic mitral (valve) insufficiency: Secondary | ICD-10-CM

## 2023-07-21 DIAGNOSIS — Z9889 Other specified postprocedural states: Secondary | ICD-10-CM | POA: Insufficient documentation

## 2023-07-21 DIAGNOSIS — I088 Other rheumatic multiple valve diseases: Secondary | ICD-10-CM | POA: Diagnosis not present

## 2023-07-21 DIAGNOSIS — Z7901 Long term (current) use of anticoagulants: Secondary | ICD-10-CM | POA: Diagnosis not present

## 2023-07-21 LAB — POCT INR: INR: 3 (ref 2.0–3.0)

## 2023-07-21 LAB — ECHOCARDIOGRAM COMPLETE
Area-P 1/2: 2.39 cm2
MV VTI: 1.14 cm2
S' Lateral: 3.15 cm

## 2023-07-21 NOTE — Patient Instructions (Signed)
Continue taking warfarin 1.5 tablets daily except for 1 tablet on Wednesday. Recheck INR in 6 weeks. Coumadin Clinic [phone]; Procedure Fax Number [phone] or [phone] Lumbar Injection TBD will need Lovenox Bridge.

## 2023-08-04 DIAGNOSIS — H04123 Dry eye syndrome of bilateral lacrimal glands: Secondary | ICD-10-CM | POA: Diagnosis not present

## 2023-08-04 DIAGNOSIS — H5212 Myopia, left eye: Secondary | ICD-10-CM | POA: Diagnosis not present

## 2023-08-04 DIAGNOSIS — H40013 Open angle with borderline findings, low risk, bilateral: Secondary | ICD-10-CM | POA: Diagnosis not present

## 2023-08-04 DIAGNOSIS — H4911 Fourth [trochlear] nerve palsy, right eye: Secondary | ICD-10-CM | POA: Diagnosis not present

## 2023-08-04 DIAGNOSIS — H353131 Nonexudative age-related macular degeneration, bilateral, early dry stage: Secondary | ICD-10-CM | POA: Diagnosis not present

## 2023-08-04 NOTE — Progress Notes (Unsigned)
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 Check B12 level Follow up with me in January as already scheduled.  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  She is accompanied by her husband who supplements history.  UPDATE: Current medications:  gabapentin 200mg  at bedtime  Reports increased memory problems over the past year, gradual.  Mostly she has some word-finding difficulties.  Stops conversation for a couple of seconds to recall a word.  May repeat questions.  Sometimes may forget why she walked into a room.  She has forgotten the words to a prayer.  However, she is able to perform all ADLs.  Does not get lost when driving.  Marland Kitchen  Her mother had Alzheimer's   TSH and Vit D from February were 0.55 and 65 respectively  HISTORY: Initially saw her for headache in 2020: Onset:  She has history of migraines for many years.  She presents for this different headache which began in her 21s. Location:  Left greater than right temple Quality:  Electric shock Intensity:  10/10.  She denies new headache, thunderclap headache or severe headache that wakes her from sleep. Associated symptoms:  Usually no associated symptoms.  Sometimes there is associated conjunctival injection on side of pain.  Photophobia as well.  No associated neck pain.  She denies associated phonophobia, osmophobia, nausea, vomiting, visual disturbance or unilateral numbness or weakness. Duration:  5 seconds Frequency:  2 to 3 times a day and it may occur again in 2 to 4 weeks. Frequency of abortive medication:  rarely Triggers:  Possibly sweet foods Relieving factors: Tylenol, Fioricet Activity:  No CT head on 11/15/2019 Showed calcified 1.2 x 1.3 cm meningioma over the left frontal convexities.   Cervicalgia: Onset 2021.  It is a dull persistent bilateral pain in back of neck radiating up both occipital regions to top of head.  Pain aggravated turning head to either side or looking up.  She feels a soft "bump" on both sides of posterior head. No paresthesias on back of head.  No radicular pain down the arms.  No preceding neck/head trauma or strenuous activity involving the neck.  She denies prior history of neck pain.  She has been treating with Tylenol and gabapentin.  Subsequently had cervical ACDF in 2023 which was helpful for awhile but headaches and neck pain returned.  Cervical spine X-ray on 10/14/2022 showed C3-C5 ACDF and multilevel degenerative changes at C4-5, C5-6 and C6-7.   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     Past NSAIDS:  ibuprofen  Past analgesics:  tramadol Past antihypertensive medications:  clonidine Past antidepressant medications:  none Past anticonvulsant medications:  gabapentin   Family history of headache:  No   Neuralgia, probably residual from prior TMJ inflammation  She was admitted to [facility] on 07/29/2021 for stroke-like symptoms.  She presented for binocular vertical diplopia.  No speech disturbance, facial droop, vertigo, numbness or weakness.  IV t-PA not given as she was outside the window.  CT head showed stable left frontal meningioma but no acute stroke.  CTA of head and neck negative for LVO or hemodynamically  significant stenosis.  MRI of brain was negative for acute stroke but DWI-negative brainstem infarct was suspected.  LDL was 95 and Hgb A1c was 5.6.  Echocardiogram performed in August showed EF 40-45%.  Was already on Coumadin for MVR with appendage clipping in June.  She was continued on Coumadin and ASA  81mg  daily.  Lipitor was increased from 20mg  to 40mg  daily.  Still with vertical diplopia.  Cervical X-ray showed degenerative changes, also noted on CTA of neck.  Gabapentin 200mg  at bedtime not effective.  PAST MEDICAL HISTORY: Past Medical History:  Diagnosis Date   Arthritis    Atypical chest pain    Diverticulosis    Gastritis 11/08/2007, 04/12/2012   H pylori bx neg 03/2012   GERD (gastroesophageal reflux disease) 11/08/2007, 04/12/2012   Hepatic cyst    Hiatal hernia 11/08/2007, 04/12/2012   History of kidney stones    Hyperlipidemia    Hypertension    IBS (irritable bowel syndrome)    diarrhea predom   Migraine    Mitral regurgitation    Osteoporosis    DEXA 01/21/2012: -2.1 spine and R fem, -1.8 L fem s/p fosamax  2004-2011 DEXA 04/18/14 @ [facility]: -2.5 - rec to start Prolia     PVC's (premature ventricular contractions)    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     amLODipine (NORVASC) 5 MG tablet Take 1 tablet (5 mg total) by mouth daily. 90 tablet 3   aspirin EC 81 MG EC tablet Take 1 tablet (81 mg total) by mouth daily. Swallow whole. 30 tablet 11   atorvastatin (LIPITOR) 40 MG tablet TAKE ONE TABLET BY MOUTH ONCE DAILY 90 tablet 3   carbamide peroxide (DEBROX) 6.5 % OTIC solution Place 5 drops into the left ear 2 (two) times daily. 15 mL 0   cloNIDine (CATAPRES) 0.1 MG tablet Take 1 tablet (0.1 mg total) by mouth daily as needed. If blood pressure 160/90 or higher. 30 tablet 1   Cyanocobalamin (VITAMIN B-12)  2500 MCG SUBL Place 2,500 mcg under the tongue daily.     dicyclomine (BENTYL) 10 MG capsule Take 1 capsule (10 mg total) by mouth 2 (two) times daily. 60 capsule 3   diltiazem 2 % GEL Apply 1 Application topically 3 (three) times daily. Apply a pea sized amount internally (rectally) three times daily. 30 g 1   famotidine (PEPCID) 20 MG tablet Take 1 tablet (20 mg total) by mouth daily. 30 tablet 3   gabapentin (NEURONTIN) 100 MG capsule Take 2 capsules (200 mg total) by mouth at bedtime. 60 capsule 5   methylcellulose (CITRUCEL) oral powder Take 1 packet by mouth daily. 30 g 1   metoprolol succinate (TOPROL-XL) 25 MG 24 hr tablet TAKE ONE TABLET BY MOUTH EVERY MORNING & TAKE ONE TABLET BY MOUTH EVERY EVENING 180 tablet 2   Peppermint Oil (IBGARD) 90 MG CPCR Use as needed (Patient taking differently: Take 90 mg by mouth daily as needed (Stomach control). Use as needed)     Polyethyl Glycol-Propyl Glycol (SYSTANE) 0.4-0.3 % SOLN  Place 1 drop into both eyes 2 (two) times daily.     warfarin (COUMADIN) 2 MG tablet Take 1 to 1 and 1/2 tablets by mouth once daily as directed by coumadin clinic. 120 tablet 0   No current facility-administered medications on file prior to visit.    ALLERGIES: No Known Allergies  FAMILY HISTORY: Family History  Problem Relation Age of Onset   Hypertension Mother    Alzheimer's disease Mother 100   AAA (abdominal aortic aneurysm) Mother    Stroke Father 74   Kidney cancer Brother        mets   Bone cancer Brother 91   Colon cancer Neg Hx       Objective:  Blood pressure 124/71, pulse 69, height 5\' 3"  (1.6 m), weight 137 lb 12.8 oz (62.5 kg), SpO2 98%. General: No acute distress.  Patient appears well-groomed.   Head:  Normocephalic/atraumatic Eyes:  Fundi examined but not visualized Neck: supple, left paraspinal tenderness, reduced range of motion in all directions Heart:  Regular rate and rhythm Neurological Exam:     08/06/2023    6:00 AM  [place]  Cognitive Assessment   Visuospatial/ Executive (0/5) 2  Naming (0/3) 3  Attention: Read list of digits (0/2) 1  Attention: Read list of letters (0/1) 1  Attention: Serial 7 subtraction starting at 100 (0/3) 2  Language: Repeat phrase (0/2) 1  Language : Fluency (0/1) 0  Abstraction (0/2) 0  Delayed Recall (0/5) 2  Orientation (0/6) 6  Total 18  Adjusted Score (based on education) 18     Shon Millet, DO  CC: Cheryll Cockayne, MD

## 2023-08-05 ENCOUNTER — Other Ambulatory Visit (INDEPENDENT_AMBULATORY_CARE_PROVIDER_SITE_OTHER): Payer: Medicare HMO

## 2023-08-05 ENCOUNTER — Ambulatory Visit: Payer: Medicare HMO | Admitting: Neurology

## 2023-08-05 VITALS — BP 126/69 | HR 72 | Ht 63.0 in | Wt 137.0 lb

## 2023-08-05 DIAGNOSIS — R413 Other amnesia: Secondary | ICD-10-CM | POA: Diagnosis not present

## 2023-08-05 NOTE — Patient Instructions (Signed)
CHECK B12 LEVEL NEUROPSYCHOLOGICAL EVALUATION SCHEDULED IN LATE FEBRUARY FOLLOW UP WITH ME IN JANUARY AS ALREADY SCHEDULED

## 2023-08-06 ENCOUNTER — Encounter: Payer: Self-pay | Admitting: Neurology

## 2023-08-06 LAB — VITAMIN B12: Vitamin B-12: 1368 pg/mL — ABNORMAL HIGH (ref 211–911)

## 2023-08-10 ENCOUNTER — Telehealth: Payer: Self-pay

## 2023-08-10 NOTE — Telephone Encounter (Signed)
Pt ready for scheduling 09/08/23.  Estimated out-of-pocket cost due at time of visit: $315  Primary Insurance: [employer] Prolia co-insurance: 20% (approximately $315)  Deductible: $1412.85 out of $4500  Eligible for co-pay program: No  Prior Auth: Approved PA#: [id number] EYAQ Valid: 03/03/23-03/02/14  This summary of benefits is an estimation of the patient's out-of-pocket cost. Exact cost may vary based on individual plan coverage.

## 2023-08-18 ENCOUNTER — Ambulatory Visit: Payer: Medicare HMO | Admitting: Psychology

## 2023-08-18 ENCOUNTER — Encounter: Payer: Self-pay | Admitting: Psychology

## 2023-08-18 DIAGNOSIS — I679 Cerebrovascular disease, unspecified: Secondary | ICD-10-CM

## 2023-08-18 DIAGNOSIS — G3184 Mild cognitive impairment, so stated: Secondary | ICD-10-CM | POA: Diagnosis not present

## 2023-08-18 DIAGNOSIS — R4189 Other symptoms and signs involving cognitive functions and awareness: Secondary | ICD-10-CM

## 2023-08-18 HISTORY — DX: Mild cognitive impairment of uncertain or unknown etiology: G31.84

## 2023-08-18 NOTE — Progress Notes (Unsigned)
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 dysfunction.  Tables of Scores:   Note: This summary of test scores accompanies the interpretive report and should not be considered in isolation without reference to the appropriate sections in the text. Descriptors are based on appropriate normative data and may be adjusted based on clinical judgment. Terms such as "Within Normal Limits" and "Outside Normal Limits" are used when a more specific description of the test score cannot be determined.       Percentile - Normative Descriptor > 98 - Exceptionally High 91-97 - Well Above Average 75-90 - Above Average 25-74 - Average 9-24 - Below Average 2-8 - Well Below Average < 2 - Exceptionally Low       Validity:   DESCRIPTOR       DCT: --- --- Within Normal Limits  RBANS EI: --- --- Within Normal Limits  WAIS-IV RDS: --- --- Within Normal Limits       Orientation:      Raw Score Percentile   NAB Orientation, Form 1 28/29 --- ---       Cognitive Screening:      Raw Score Percentile   SLUMS: 17/30 --- ---       RBANS, Form A: Standard Score/ Scaled Score Percentile   Total Score 78 7 Well  Below Average  Immediate Memory 78 7 Well Below Average    List Learning 6 9 Below Average    Story Memory 6 9 Below Average  Visuospatial/Constructional 100 50 Average    Figure Copy 12 75 Above Average    Line Orientation 15/20 26-50 Average  Language 60 <1 Exceptionally Low    Picture Naming 7/10 <2 Exceptionally Low    Semantic Fluency 4 2 Well Below Average  Attention 94 34 Average    Digit Span 10 50 Average    Coding 8 25 Average  Delayed Memory 81 10 Below Average    List Recall 4/10 26-50 Average    List Recognition 17/20 10-16 Below Average    Story Recall 9 37 Average    Story Recognition 12/12 69+ Average    Figure Recall 8 25 Average    Figure Recognition 4/8 9-20 Below Average        Intellectual Functioning:      Standard Score Percentile   Test of Premorbid Functioning: 98 45 Average       Attention/Executive Function:  Trail Making Test (TMT): Raw Score (T Score) Percentile     Part A 53 secs.,  0 errors (34) 5 Well Below Average    Part B 177 secs.,  2 errors (32) 4 Well Below Average         Scaled Score Percentile   WAIS-IV Digit Span: 8 25 Average    Forward 8 25 Average    Backward 9 37 Average    Sequencing 6 9 Below Average       D-KEFS Color-Word Interference Test: Raw Score (Scaled Score) Percentile     Color Naming 41 secs. (7) 16 Below Average    Word Reading 19 secs. (13) 84 Above Average    Inhibition 105 secs. (5) 5 Well Below Average      Total Errors 6 errors (7) 16 Below Average    Inhibition/Switching 82 secs. (10) 50 Average      Total Errors 0 errors (13) 84 Above Average       D-KEFS Verbal Fluency Test: Raw Score (Scaled Score) Percentile     Letter Total Correct 24 (7) 16 Below Average    Category Total Correct 32 (10) 50 Average    Category Switching Total Correct 8 (5) 5 Well Below Average    Category Switching Accuracy 6 (5) 5 Well Below Average      Total Set Loss Errors 1 (11) 63 Average      Total Repetition  Errors 5 (8) 25 Average       NAB Executive Functions Module, Form 1: T Score Percentile     Judgment 59 82 Above Average       Language:     Verbal Fluency Test: Raw Score (T Score) Percentile     Phonemic Fluency (FAS) 24 (31) 3 Well Below Average    Animal Fluency 20 (49) 46 Average        NAB Language Module, Form 1: T Score Percentile     Auditory Comprehension 30 2 Well Below Average    Naming 27/31 (35) 7 Well Below Average       Visuospatial/Visuoconstruction:      Raw Score Percentile   Clock Drawing: 8/10 --- Within Normal Limits        Scaled Score Percentile   WAIS-IV Block Design: 11 63 Average  WAIS-IV Matrix Reasoning: 14 91 Well Above Average       Mood and Personality:      Raw Score Percentile   Geriatric Depression Scale: 7 --- Within Normal Limits  Geriatric Anxiety Scale: 8 --- Minimal    Somatic 5 --- Minimal    Cognitive 2 --- Minimal    Affective 1 --- Minimal       Additional Questionnaires:      Raw Score Percentile   PROMIS Sleep Disturbance Questionnaire: 11 --- None to Slight   Informed Consent and Coding/Compliance:   The current evaluation represents a clinical evaluation for the purposes previously outlined by the referral source and is in no way reflective of a forensic evaluation.   Ms. Randa was provided with a verbal description of the nature and purpose of the present neuropsychological evaluation. Also reviewed were the foreseeable risks and/or discomforts and benefits of the procedure, limits of confidentiality, and mandatory reporting requirements of this provider. The patient was given the opportunity to ask questions and receive answers about the evaluation. Oral consent to participate was provided by the patient.   This evaluation was conducted by Newman Nickels, Ph.D., ABPP-CN,  board certified clinical neuropsychologist. Ms. Lamastus completed a clinical interview with Dr. Milbert Coulter, billed as one unit [id number], and 145 minutes of  cognitive testing and scoring, billed as one unit [phone] and four additional units [id number]. Psychometrist Wallace Keller, B.S. assisted Dr. Milbert Coulter with test administration and scoring procedures. As a separate and discrete service, one unit [id number] and two units [phone] were billed for Dr. Tammy Sours time spent in interpretation and report writing.

## 2023-08-18 NOTE — Progress Notes (Signed)
   Psychometrician Note   Cognitive testing was administered to [name] [name] by Wallace Keller, B.S. (psychometrist) under the supervision of Dr. Newman Nickels, Ph.D., licensed psychologist on 08/18/2023. Ms. Duba did not appear overtly distressed by the testing session per behavioral observation or responses across self-report questionnaires. Rest breaks were offered.    The battery of tests administered was selected by Dr. Newman Nickels, Ph.D. with consideration to [name] current level of functioning, the nature of her symptoms, emotional and behavioral responses during interview, level of literacy, observed level of motivation/effort, and the nature of the referral question. This battery was communicated to the psychometrist. Communication between Dr. Newman Nickels, Ph.D. and the psychometrist was ongoing throughout the evaluation and Dr. Newman Nickels, Ph.D. was immediately accessible at all times. Dr. Newman Nickels, Ph.D. provided supervision to the psychometrist on the date of this service to the extent necessary to assure the quality of all services provided.    [name] [name] will return within approximately 1-2 weeks for an interactive feedback session with Dr. Milbert Coulter at which time her test performances, clinical impressions, and treatment recommendations will be reviewed in detail. [name] understands she can contact our office should she require our assistance before this time.  A total of 145 minutes of billable time were spent face-to-face with [name] by the psychometrist. This includes both test administration and scoring time. Billing for these services is reflected in the clinical report generated by Dr. Newman Nickels, Ph.D.  This note reflects time spent with the psychometrician and does not include test scores or any clinical interpretations made by Dr. Milbert Coulter. The full report will follow in a separate note.

## 2023-08-19 ENCOUNTER — Encounter: Payer: Self-pay | Admitting: Psychology

## 2023-08-31 ENCOUNTER — Ambulatory Visit: Payer: Medicare HMO | Admitting: Psychology

## 2023-08-31 DIAGNOSIS — G3184 Mild cognitive impairment, so stated: Secondary | ICD-10-CM

## 2023-08-31 DIAGNOSIS — I679 Cerebrovascular disease, unspecified: Secondary | ICD-10-CM

## 2023-08-31 NOTE — Progress Notes (Signed)
   Neuropsychology Feedback Session [name]. [facility] Department of Neurology  Reason for Referral:   [name] [name] is a 77 y.o. right-handed [name] referred by Shon Millet, D.O., to characterize her current cognitive functioning and assist with diagnostic clarity and treatment planning in the context of subjective cognitive decline.   Feedback:   [name] [name] completed a comprehensive neuropsychological evaluation on 08/18/2023. Please refer to that encounter for the full report and recommendations. Briefly, results suggested normative deficits surrounding both receptive language and expressive language (i.e., verbal fluency, confrontation naming), as well as some performance variability across processing speed and executive functioning. Regarding her test results, it is crucial to highlight that [name] [name] was tested in [place] rather than her native and primary language of Spanish. Despite her overall fluency in [place], this represents a significant caveat and there is a very good likelihood that normative weaknesses across receptive and expressive language tasks are artificially exacerbated and do not reflect her true abilities across these cognitive domains. Excluding these findings, performance variability surrounding processing speed and executive functioning is a fairly nonspecific finding. Her most recent brain MRI suggested moderate microvascular ischemic disease and there has been some discussion in her medical chart surrounding a DWI-negative ischemic stroke in the past. Her pattern of non-language deficits is certainly a reasonable and expected finding with a primary vascular/medical cause for ongoing dysfunction. This would be further exacerbated by frequent headache experiences and report of chronic pain. Specific to memory, [name] [name] was able to learn novel verbal and visual information efficiently and retain this knowledge after lengthy delays.  Overall, memory performance combined with intact performances across other areas of cognitive functioning is not suggestive of symptomatic Alzheimer's disease.  [name] [name] was accompanied by her husband during the current feedback session. Content of the current session focused on the results of her neuropsychological evaluation. [name] [name] was given the opportunity to ask questions and her questions were answered. She was encouraged to reach out should additional questions arise. A copy of her report was provided at the conclusion of the visit.      One unit [phone] was billed for Dr. Tammy Sours time spent preparing for, conducting, and documenting the current feedback session with [name] [name].

## 2023-09-01 ENCOUNTER — Other Ambulatory Visit (HOSPITAL_COMMUNITY): Payer: Self-pay

## 2023-09-01 ENCOUNTER — Ambulatory Visit: Payer: Medicare HMO | Attending: Cardiovascular Disease | Admitting: *Deleted

## 2023-09-01 DIAGNOSIS — I340 Nonrheumatic mitral (valve) insufficiency: Secondary | ICD-10-CM

## 2023-09-01 DIAGNOSIS — Z7901 Long term (current) use of anticoagulants: Secondary | ICD-10-CM

## 2023-09-01 DIAGNOSIS — Z9889 Other specified postprocedural states: Secondary | ICD-10-CM | POA: Diagnosis not present

## 2023-09-01 LAB — POCT INR: INR: 1.5 — AB (ref 2.0–3.0)

## 2023-09-01 MED ORDER — WARFARIN SODIUM 2 MG PO TABS
3.0000 mg | ORAL_TABLET | Freq: Every day | ORAL | 0 refills | Status: DC
  Filled 2023-09-01 – 2023-09-03 (×2): qty 30, 20d supply, fill #0

## 2023-09-01 MED ORDER — FAMOTIDINE 20 MG PO TABS
20.0000 mg | ORAL_TABLET | Freq: Every day | ORAL | 0 refills | Status: DC
  Filled 2023-09-01 – 2023-09-03 (×2): qty 62, 62d supply, fill #0

## 2023-09-01 MED ORDER — CLONIDINE HCL 0.1 MG PO TABS
0.1000 mg | ORAL_TABLET | Freq: Every day | ORAL | 0 refills | Status: DC | PRN
  Filled 2023-09-01 – 2023-09-03 (×2): qty 60, 60d supply, fill #0

## 2023-09-01 MED ORDER — ATORVASTATIN CALCIUM 40 MG PO TABS
40.0000 mg | ORAL_TABLET | Freq: Every day | ORAL | 0 refills | Status: DC
  Filled 2023-09-01 – 2023-09-04 (×3): qty 90, 90d supply, fill #0

## 2023-09-01 MED ORDER — AMLODIPINE BESYLATE 5 MG PO TABS
5.0000 mg | ORAL_TABLET | Freq: Every day | ORAL | 0 refills | Status: DC
  Filled 2023-09-01 – 2023-09-04 (×3): qty 90, 90d supply, fill #0

## 2023-09-01 MED ORDER — DICLOFENAC SODIUM 50 MG PO TBEC
50.0000 mg | DELAYED_RELEASE_TABLET | Freq: Two times a day (BID) | ORAL | 0 refills | Status: DC
  Filled 2023-09-01 – 2023-09-03 (×2): qty 60, 30d supply, fill #0

## 2023-09-01 MED ORDER — METOPROLOL SUCCINATE ER 25 MG PO TB24
25.0000 mg | ORAL_TABLET | Freq: Two times a day (BID) | ORAL | 0 refills | Status: DC
  Filled 2023-09-01 – 2023-09-03 (×2): qty 180, 90d supply, fill #0

## 2023-09-01 MED ORDER — GABAPENTIN 100 MG PO CAPS
100.0000 mg | ORAL_CAPSULE | Freq: Two times a day (BID) | ORAL | 0 refills | Status: DC
  Filled 2023-09-01 – 2023-09-03 (×2): qty 180, 90d supply, fill #0

## 2023-09-01 NOTE — Patient Instructions (Addendum)
Description   Today take 2 tablets of warfarin then continue taking warfarin 1.5 tablets daily except for 1 tablet on Wednesday. Recheck INR in 2 weeks (normally 6 weeks). [facility] [phone]; Procedure Fax Number [phone] or [phone] Lumbar Injection TBD will need Lovenox Bridge.

## 2023-09-03 ENCOUNTER — Other Ambulatory Visit (HOSPITAL_COMMUNITY): Payer: Self-pay

## 2023-09-04 ENCOUNTER — Other Ambulatory Visit: Payer: Self-pay

## 2023-09-04 ENCOUNTER — Other Ambulatory Visit (HOSPITAL_COMMUNITY): Payer: Self-pay

## 2023-09-08 ENCOUNTER — Ambulatory Visit (INDEPENDENT_AMBULATORY_CARE_PROVIDER_SITE_OTHER): Payer: Medicare HMO

## 2023-09-08 DIAGNOSIS — M816 Localized osteoporosis [Lequesne]: Secondary | ICD-10-CM

## 2023-09-08 DIAGNOSIS — E782 Mixed hyperlipidemia: Secondary | ICD-10-CM

## 2023-09-08 MED ORDER — DENOSUMAB 60 MG/ML ~~LOC~~ SOSY
60.0000 mg | PREFILLED_SYRINGE | Freq: Once | SUBCUTANEOUS | Status: AC
Start: 2023-09-08 — End: 2023-09-08
  Administered 2023-09-08: 60 mg via SUBCUTANEOUS

## 2023-09-08 NOTE — Telephone Encounter (Signed)
Prolia authorization # [id number] EYAQ 03/03/23 to 03/02/24

## 2023-09-08 NOTE — Progress Notes (Signed)
Per orders of Dr. [place] , injection of Prolia  given by Stann Ore. Patient tolerated injection well.

## 2023-09-11 ENCOUNTER — Ambulatory Visit: Payer: Medicare HMO

## 2023-09-15 ENCOUNTER — Ambulatory Visit: Payer: Medicare HMO | Attending: Cardiovascular Disease | Admitting: *Deleted

## 2023-09-15 DIAGNOSIS — Z9889 Other specified postprocedural states: Secondary | ICD-10-CM | POA: Diagnosis not present

## 2023-09-15 DIAGNOSIS — Z7901 Long term (current) use of anticoagulants: Secondary | ICD-10-CM | POA: Diagnosis not present

## 2023-09-15 DIAGNOSIS — I340 Nonrheumatic mitral (valve) insufficiency: Secondary | ICD-10-CM

## 2023-09-15 LAB — POCT INR: INR: 2.3 (ref 2.0–3.0)

## 2023-09-15 NOTE — Patient Instructions (Signed)
Description   Continue taking warfarin 1.5 tablets daily except for 1 tablet on Wednesday. Recheck INR in 4 weeks (normally 6 weeks). [facility] [phone]; Procedure Fax Number [phone] or [phone] Lumbar Injection TBD will need Lovenox Bridge.

## 2023-09-29 ENCOUNTER — Other Ambulatory Visit (HOSPITAL_COMMUNITY): Payer: Self-pay

## 2023-09-29 ENCOUNTER — Other Ambulatory Visit (HOSPITAL_BASED_OUTPATIENT_CLINIC_OR_DEPARTMENT_OTHER): Payer: Self-pay | Admitting: Cardiovascular Disease

## 2023-09-29 ENCOUNTER — Other Ambulatory Visit: Payer: Self-pay

## 2023-09-29 DIAGNOSIS — Z9889 Other specified postprocedural states: Secondary | ICD-10-CM

## 2023-09-29 DIAGNOSIS — I480 Paroxysmal atrial fibrillation: Secondary | ICD-10-CM

## 2023-09-29 MED ORDER — WARFARIN SODIUM 2 MG PO TABS
3.0000 mg | ORAL_TABLET | Freq: Every day | ORAL | 0 refills | Status: DC
Start: 2023-09-29 — End: 2023-09-29
  Filled 2023-09-29: qty 45, 30d supply, fill #0

## 2023-09-29 MED ORDER — WARFARIN SODIUM 2 MG PO TABS
3.0000 mg | ORAL_TABLET | Freq: Every day | ORAL | 0 refills | Status: DC
Start: 2023-09-29 — End: 2023-10-30
  Filled 2023-09-29: qty 45, 30d supply, fill #0

## 2023-09-29 NOTE — Telephone Encounter (Signed)
Warfarin 2mg  refill S/P minimally-invasive mitral valve repair  Last INR 09/15/23 Last OV 09/18/22-PLACED NOTE ON NEXT ANTICOAG APPT

## 2023-09-30 ENCOUNTER — Other Ambulatory Visit: Payer: Self-pay | Admitting: Cardiovascular Disease

## 2023-09-30 ENCOUNTER — Other Ambulatory Visit: Payer: Self-pay | Admitting: Gastroenterology

## 2023-09-30 ENCOUNTER — Other Ambulatory Visit (HOSPITAL_COMMUNITY): Payer: Self-pay

## 2023-09-30 DIAGNOSIS — Z9889 Other specified postprocedural states: Secondary | ICD-10-CM

## 2023-10-01 ENCOUNTER — Other Ambulatory Visit (HOSPITAL_COMMUNITY): Payer: Self-pay

## 2023-10-01 MED ORDER — FAMOTIDINE 20 MG PO TABS
20.0000 mg | ORAL_TABLET | Freq: Every day | ORAL | 0 refills | Status: DC
  Filled 2023-10-01: qty 62, 62d supply, fill #0

## 2023-10-01 MED ORDER — CLONIDINE HCL 0.1 MG PO TABS
0.1000 mg | ORAL_TABLET | Freq: Every day | ORAL | 0 refills | Status: DC | PRN
  Filled 2023-10-01: qty 30, 30d supply, fill #0

## 2023-10-02 ENCOUNTER — Other Ambulatory Visit (HOSPITAL_COMMUNITY): Payer: Self-pay

## 2023-10-13 ENCOUNTER — Ambulatory Visit: Payer: Medicare HMO | Attending: Cardiology | Admitting: *Deleted

## 2023-10-13 DIAGNOSIS — Z7901 Long term (current) use of anticoagulants: Secondary | ICD-10-CM

## 2023-10-13 DIAGNOSIS — M259 Joint disorder, unspecified: Secondary | ICD-10-CM | POA: Diagnosis not present

## 2023-10-13 DIAGNOSIS — M533 Sacrococcygeal disorders, not elsewhere classified: Secondary | ICD-10-CM | POA: Insufficient documentation

## 2023-10-13 DIAGNOSIS — Z9889 Other specified postprocedural states: Secondary | ICD-10-CM | POA: Diagnosis not present

## 2023-10-13 DIAGNOSIS — M5451 Vertebrogenic low back pain: Secondary | ICD-10-CM | POA: Diagnosis not present

## 2023-10-13 DIAGNOSIS — I340 Nonrheumatic mitral (valve) insufficiency: Secondary | ICD-10-CM | POA: Diagnosis not present

## 2023-10-13 LAB — POCT INR: INR: 2.5 (ref 2.0–3.0)

## 2023-10-13 NOTE — Patient Instructions (Signed)
Description   Continue taking warfarin 1.5 tablets daily except for 1 tablet on Wednesday. Recheck INR in 5 weeks (normally 6 weeks). [facility] [phone]; Procedure Fax Number [phone] or [phone] Lumbar Injection TBD will need Lovenox Bridge.

## 2023-10-14 ENCOUNTER — Other Ambulatory Visit (HOSPITAL_COMMUNITY): Payer: Self-pay

## 2023-10-27 ENCOUNTER — Encounter: Payer: Self-pay | Admitting: Family Medicine

## 2023-10-27 ENCOUNTER — Ambulatory Visit: Payer: Medicare HMO | Admitting: Family Medicine

## 2023-10-27 VITALS — BP 120/80 | HR 79 | Resp 16 | Ht 63.0 in | Wt 135.4 lb

## 2023-10-27 DIAGNOSIS — G8929 Other chronic pain: Secondary | ICD-10-CM

## 2023-10-27 DIAGNOSIS — G3184 Mild cognitive impairment, so stated: Secondary | ICD-10-CM

## 2023-10-27 DIAGNOSIS — M545 Low back pain, unspecified: Secondary | ICD-10-CM | POA: Diagnosis not present

## 2023-10-27 DIAGNOSIS — I10 Essential (primary) hypertension: Secondary | ICD-10-CM | POA: Diagnosis not present

## 2023-10-27 MED ORDER — TRAMADOL HCL 50 MG PO TABS
25.0000 mg | ORAL_TABLET | Freq: Two times a day (BID) | ORAL | 1 refills | Status: DC | PRN
Start: 2023-10-27 — End: 2023-11-16

## 2023-10-27 NOTE — Assessment & Plan Note (Signed)
BP is adequately controlled. Recommend continuing amlodipine 5 mg daily and metoprolol succinate 25 mg twice daily.  She also has clonidine 0.1 mg daily as needed, she has not needed this medication in some time. Continue monitoring BP regularly. Continue low-salt diet. Eye exam is current.

## 2023-10-27 NOTE — Progress Notes (Unsigned)
HPI: [name] is a 77 y.o. female with a PMHx significant for HTN, paroxysmal atrial fibrillation, varicose veins, GERD, IBS, OA, trigger fingers, HLD, and prediabetes, among others, who is here today for chronic disease management.  Last seen on 04/21/2023  Neck and lower back pain:  Currently on tramadol 25-50 mg daily. She says she is not taking it often and when she does she is only taking a half tablet. She has established with a pain specialist, and recently had a back injection from them.  She says she is still having pain when she walks or bends her neck.   Trigger Finger:  She mentions she recently had surgery on her right hand, which has helped with the trigger finger in her thumb.   Headaches;  She has been following with neurology for her headaches She is currently taking gabapentin 200 mg at night.   Hypertension:  Currently on amlodipine 5 mg daily, clonidine 0.1 mg daily prn, and metoprolol succinate 25 mg bid.  She has been checking her BP regularly at home, and says her readings have been normal.  She follows with cardiology for HTN and HLD.  Negative for unusual or severe headache, visual changes, exertional chest pain, dyspnea,  focal weakness, or edema. Lab Results  Component Value Date   CREATININE 0.88 01/22/2023   BUN 30 (H) 01/22/2023   NA 140 01/22/2023   K 4.0 01/22/2023   CL 105 01/22/2023   CO2 24 01/22/2023   Hyperlipidemia:  Currently on atorvastatin 40 mg daily.   Review of Systems See other pertinent positives and negatives in HPI.  Current Outpatient Medications on File Prior to Visit  Medication Sig Dispense Refill   acetaminophen (TYLENOL) 650 MG CR tablet Take 650 mg by mouth every 8 (eight) hours as needed for pain (Headache).     amLODipine (NORVASC) 5 MG tablet Take 1 tablet (5 mg total) by mouth daily. 90 tablet 3   aspirin EC 81 MG EC tablet Take 1 tablet (81 mg total) by mouth daily. Swallow whole. 30 tablet 11    atorvastatin (LIPITOR) 40 MG tablet TAKE ONE TABLET BY MOUTH ONCE DAILY 90 tablet 3   carbamide peroxide (DEBROX) 6.5 % OTIC solution Place 5 drops into the left ear 2 (two) times daily. 15 mL 0   cloNIDine (CATAPRES) 0.1 MG tablet Take 1 tablet (0.1 mg total) by mouth daily as needed. If blood pressure 160/90 or higher. 30 tablet 1   Cyanocobalamin (VITAMIN B-12) 2500 MCG SUBL Place 2,500 mcg under the tongue daily.     famotidine (PEPCID) 20 MG tablet Take 1 tablet (20 mg total) by mouth daily. 30 tablet 3   gabapentin (NEURONTIN) 100 MG capsule Take 2 capsules (200 mg total) by mouth at bedtime. 60 capsule 5   metoprolol succinate (TOPROL-XL) 25 MG 24 hr tablet TAKE ONE TABLET BY MOUTH EVERY MORNING & TAKE ONE TABLET BY MOUTH EVERY EVENING 180 tablet 2   Peppermint Oil (IBGARD) 90 MG CPCR Use as needed (Patient taking differently: Take 90 mg by mouth daily as needed (Stomach control). Use as needed)     Polyethyl Glycol-Propyl Glycol (SYSTANE) 0.4-0.3 % SOLN Place 1 drop into both eyes 2 (two) times daily.     warfarin (COUMADIN) 2 MG tablet Take 1.5 tablets (3 mg total) by mouth daily. PLEASE CALL THE OFFICE TO SCHEDULE CARDIOLOGIST APPT 45 tablet 0   No current facility-administered medications on file prior to visit.  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   Dr Augustine Radar, Sande Rives, Dr Everlena Cooper     Abnormal CT scan of lung 02/06/2021   Acute bursitis of right shoulder 07/22/2022   Injection given July 22, 2022     Acute post-traumatic headache, not intractable 11/15/2019   Allergic rhinitis    Atypical chest pain    Bilateral carpal tunnel syndrome 07/17/2017   EMG, Levert Feinstein, M.D  06/2017     Bilateral primary osteoarthritis of knee 10/03/2013   Bilateral injections given May 09, 2020     Bilateral shoulder pain 12/11/2017   Binocular vision disorder with diplopia 07/29/2021   Carbuncle of groin 02/25/2019   Cardiomegaly 02/05/2021    Cervicalgia 10/28/2022   Cervicogenic headache 06/03/2018   Chest pain 02/05/2021   Chronic midline low back pain without sciatica 09/15/2016   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   Degenerative spondylolisthesis 05/06/2022   Diverticulosis    Dupuytren's contracture of hand 07/20/2017   Epigastric pain 12/26/2010   Essential hypertension 09/27/2010   Gastritis 11/08/2007, 04/12/2012   H pylori bx neg 03/2012   Generalized osteoarthritis of multiple sites 06/30/2017   GERD (gastroesophageal reflux disease) 11/08/2007   Heart failure 02/21/2021   Hepatic cyst    Hiatal hernia    11/08/2007, 04/12/2012   Hyperlipidemia    IBS (irritable bowel syndrome)    diarrhea predom   Joint pain 06/03/2016   Lateral meniscal tear 10/03/2013   Degenerative seen on ultrasound October 03, 2013     Left lower quadrant abdominal pain 05/01/2020   Leg cramps 06/10/2017   Long term (current) use of anticoagulants 05/29/2021   Migraine headache 09/27/2010   Mild cognitive impairment 08/18/2023   Mitral regurgitation    Murmur 02/21/2021   Osteopenia 07/20/2016   dexa 01/2019: Spine -2.2, RFN -2.2, LFN -2.0, decrease since prior scan.. frax -12.5%, 3%-we will look into Prolia-if not covered then Reclast  DEXA from [place]-osteoporosis  Prior dexa scans have showed osteoporosis     Osteoporosis    DEXA 01/21/2012: -2.1 spine and R fem, -1.8 L fem s/p fosamax  2004-2011 DEXA 04/18/14 @ [facility]: -2.5 - rec to start Prolia     Paroxysmal atrial fibrillation 06/19/2021   Patellofemoral pain syndrome 10/03/2013   Personal history of urinary calculi 09/27/2010   Prediabetes 05/02/2019   PVC's (premature ventricular contractions)    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   Complex valvuloplasty including PTFE neochord placement x6 with 30 mm Medtronic Simuform ring annuloplasty with clipping of LA appendage   Schatzki's ring 11/11/2010   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   Shingles  outbreak 04/2013   Skin tag 10/28/2022   Spinal stenosis, lumbar region, 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  Repeat injection given January 22, 2023     Trigger thumb of right hand 01/22/2023   Injection given January 22, 2023 repeat at the A1 pulley Apr 02, 2023     Varicose vein of leg    No Known Allergies  Social History   Socioeconomic History   Marital status: Married    Spouse name: Madaline Guthrie   Number of children: 3   Years of education: 18   Highest education level: Master's degree (e.g., MA, MS, MEng, MEd,  MSW, MBA)  Occupational History   Occupation: Retired  Tobacco Use   Smoking status: Never   Smokeless tobacco: Never  Vaping Use   Vaping status: Never Used  Substance and Sexual Activity   Alcohol use: No   Drug use: No   Sexual activity: Not on file  Other Topics Concern   Not on file  Social History Narrative   Married, lives with spouse. Retired Comptroller, Manufacturing systems engineer. Has 3 kids (moved to [place] to be closer)- youngest son MD. Linton Ham to [place] from [place] 06/2010, lived in [place] x 10years   Social Determinants of Health   Financial Resource Strain: Low Risk  (05/15/2023)   Overall Financial Resource Strain (CARDIA)    Difficulty of Paying Living Expenses: Not hard at all  Food Insecurity: No Food Insecurity (05/15/2023)   Hunger Vital Sign    Worried About Running Out of Food in the Last Year: Never true    Ran Out of Food in the Last Year: Never true  Transportation Needs: No Transportation Needs (05/15/2023)   PRAPARE - Administrator, Civil Service (Medical): No    Lack of Transportation (Non-Medical): No  Physical Activity: Insufficiently Active (05/15/2023)   Exercise Vital Sign    Days of Exercise per Week: 3 days    Minutes of Exercise per  Session: 30 min  Stress: No Stress Concern Present (05/15/2023)   [employer] of Occupational Health - Occupational Stress Questionnaire    Feeling of Stress : Not at all  Social Connections: Socially Integrated (05/15/2023)   Social Connection and Isolation Panel [NHANES]    Frequency of Communication with Friends and Family: More than three times a week    Frequency of Social Gatherings with Friends and Family: More than three times a week    Attends Religious Services: More than 4 times per year    Active Member of [employer] or Organizations: Yes    Attends Banker Meetings: More than 4 times per year    Marital Status: Married    Vitals:   10/27/23 0950  BP: 120/80  Pulse: 79  Resp: 16  SpO2: 98%   Body mass index is 23.98 kg/m.  Physical Exam Vitals and nursing note reviewed.  Constitutional:      General: She is not in acute distress.    Appearance: She is well-developed.  HENT:     Head: Normocephalic and atraumatic.     Mouth/Throat:     Mouth: Mucous membranes are moist.     Pharynx: Oropharynx is clear.  Eyes:     Conjunctiva/sclera: Conjunctivae normal.  Cardiovascular:     Rate and Rhythm: Normal rate and regular rhythm.     Pulses:          Dorsalis pedis pulses are 2+ on the right side and 2+ on the left side.     Heart sounds: No murmur heard. Pulmonary:     Effort: Pulmonary effort is normal. No respiratory distress.     Breath sounds: Normal breath sounds.  Abdominal:     Palpations: Abdomen is soft. There is no hepatomegaly or mass.     Tenderness: There is no abdominal tenderness.  Musculoskeletal:     Cervical back: No tenderness.     Right lower leg: No edema.     Left lower leg: No edema.  Lymphadenopathy:     Cervical: No cervical adenopathy.  Skin:    General: Skin is warm.     Findings:  No erythema or rash.  Neurological:     General: No focal deficit present.     Mental Status: She is alert and oriented to person, place,  and time.     Cranial Nerves: No cranial nerve deficit.     Gait: Gait normal.  Psychiatric:     Comments: Well groomed, good eye contact.    ASSESSMENT AND PLAN:  Ms. Fang was seen today for chronic disease management.   No orders of the defined types were placed in this encounter.   Essential hypertension BP is adequately controlled. Recommend continuing amlodipine 5 mg daily and metoprolol succinate 25 mg twice daily.  She also has clonidine 0.1 mg daily as needed, she has not needed this medication in some time. Continue monitoring BP regularly. Continue low-salt diet. Eye exam is current.  Chronic midline low back pain without sciatica Per patient report, she is having a "back injection" in a few days. Continue tramadol 50 mg 1/2 to 1 tablet twice daily as needed.  We discussed some side effects.  She does not take medication frequently, so if today prescription last for 1 year, I can continue following annually, otherwise every 6 months.   Return in about 6 months (around 04/26/2024).  I, Rolla Etienne Wierda, acting as a scribe for [name] Schwering [place], MD., have documented all relevant documentation on the behalf of [name] Runk [place], MD, as directed by  Tarell Schollmeyer [place], MD while in the presence of Darrien Laakso [place], MD.   I, Allesha Aronoff [place], MD, have reviewed all documentation for this visit. The documentation on 10/27/23 for the exam, diagnosis, procedures, and orders are all accurate and complete.  Tayvon Culley G. [place], MD  [facility] [place] office.

## 2023-10-27 NOTE — Assessment & Plan Note (Signed)
Per patient report, she is having a "back injection" in a few days. Continue tramadol 50 mg 1/2 to 1 tablet twice daily as needed.  We discussed some side effects.  She does not take medication frequently, so if today prescription last for 1 year, I can continue following annually, otherwise every 6 months.

## 2023-10-27 NOTE — Patient Instructions (Addendum)
A few things to remember from today's visit:  Chronic midline low back pain without sciatica  Essential hypertension No changes today. If prescription for Tramadol last a year, I can see you annually, otherwise we continue every 6 months.  If you need refills for medications you take chronically, please call your pharmacy. Do not use My Chart to request refills or for acute issues that need immediate attention. If you send a my chart message, it may take a few days to be addressed, specially if I am not in the office.  Please be sure medication list is accurate. If a new problem present, please set up appointment sooner than planned today.

## 2023-10-30 ENCOUNTER — Other Ambulatory Visit: Payer: Self-pay | Admitting: Cardiovascular Disease

## 2023-10-30 ENCOUNTER — Other Ambulatory Visit (HOSPITAL_COMMUNITY): Payer: Self-pay

## 2023-10-30 DIAGNOSIS — Z9889 Other specified postprocedural states: Secondary | ICD-10-CM

## 2023-10-30 MED ORDER — WARFARIN SODIUM 2 MG PO TABS
2.0000 mg | ORAL_TABLET | Freq: Every day | ORAL | 1 refills | Status: DC
Start: 2023-10-30 — End: 2023-12-26
  Filled 2023-10-30: qty 45, 30d supply, fill #0
  Filled 2023-12-03 (×2): qty 45, 30d supply, fill #1

## 2023-10-30 NOTE — Telephone Encounter (Signed)
Warfarin 2mg  refill S/P minimally-invasive mitral valve repair  Last INR 10/13/23  Last OV 09/18/22-Needs appt & pending appt 11/16/23

## 2023-10-31 ENCOUNTER — Other Ambulatory Visit (HOSPITAL_COMMUNITY): Payer: Self-pay

## 2023-11-09 DIAGNOSIS — M533 Sacrococcygeal disorders, not elsewhere classified: Secondary | ICD-10-CM | POA: Insufficient documentation

## 2023-11-11 ENCOUNTER — Encounter (HOSPITAL_BASED_OUTPATIENT_CLINIC_OR_DEPARTMENT_OTHER): Payer: Self-pay | Admitting: Physical Therapy

## 2023-11-11 ENCOUNTER — Ambulatory Visit (HOSPITAL_BASED_OUTPATIENT_CLINIC_OR_DEPARTMENT_OTHER): Payer: Medicare HMO | Attending: Orthopedic Surgery | Admitting: Physical Therapy

## 2023-11-11 DIAGNOSIS — M533 Sacrococcygeal disorders, not elsewhere classified: Secondary | ICD-10-CM | POA: Insufficient documentation

## 2023-11-11 DIAGNOSIS — M6281 Muscle weakness (generalized): Secondary | ICD-10-CM | POA: Diagnosis not present

## 2023-11-11 DIAGNOSIS — G8929 Other chronic pain: Secondary | ICD-10-CM | POA: Insufficient documentation

## 2023-11-11 NOTE — Therapy (Signed)
OUTPATIENT PHYSICAL THERAPY THORACOLUMBAR EVALUATION   [name] MRN: [id number] DOB:August 15, 1946, 77 y.o., female Today's Date: 11/11/2023  END OF SESSION:  PT End of Session - 11/11/23 1307     Visit Number 1    Number of Visits 8    Date for PT Re-Evaluation 12/25/23    Authorization Type aetna mcr    Progress Note Due on Visit 10    PT Start Time 0815    PT Stop Time 0855    PT Time Calculation (min) 40 min    Activity Tolerance Patient tolerated treatment well    Behavior During Therapy [facility] for tasks assessed/performed             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   Dr Augustine Radar, Sande Rives, Dr Everlena Cooper     Abnormal CT scan of lung 02/06/2021   Acute bursitis of right shoulder 07/22/2022   Injection given July 22, 2022     Acute post-traumatic headache, not intractable 11/15/2019   Allergic rhinitis    Atypical chest pain    Bilateral carpal tunnel syndrome 07/17/2017   EMG, Levert Feinstein, M.D  06/2017     Bilateral primary osteoarthritis of knee 10/03/2013   Bilateral injections given May 09, 2020     Bilateral shoulder pain 12/11/2017   Binocular vision disorder with diplopia 07/29/2021   Carbuncle of groin 02/25/2019   Cardiomegaly 02/05/2021   Cervicalgia 10/28/2022   Cervicogenic headache 06/03/2018   Chest pain 02/05/2021   Chronic midline low back pain without sciatica 09/15/2016   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   Degenerative spondylolisthesis 05/06/2022   Diverticulosis    Dupuytren's contracture of hand 07/20/2017   Epigastric pain 12/26/2010   Essential hypertension 09/27/2010   Gastritis 11/08/2007, 04/12/2012   H pylori bx neg 03/2012   Generalized osteoarthritis of multiple sites 06/30/2017   GERD (gastroesophageal reflux disease) 11/08/2007   Heart failure 02/21/2021   Hepatic cyst    Hiatal hernia    11/08/2007, 04/12/2012   Hyperlipidemia    IBS (irritable  bowel syndrome)    diarrhea predom   Joint pain 06/03/2016   Lateral meniscal tear 10/03/2013   Degenerative seen on ultrasound October 03, 2013     Left lower quadrant abdominal pain 05/01/2020   Leg cramps 06/10/2017   Long term (current) use of anticoagulants 05/29/2021   Migraine headache 09/27/2010   Mild cognitive impairment 08/18/2023   Mitral regurgitation    Murmur 02/21/2021   Osteopenia 07/20/2016   dexa 01/2019: Spine -2.2, RFN -2.2, LFN -2.0, decrease since prior scan.. frax -12.5%, 3%-we will look into Prolia-if not covered then Reclast  DEXA from [place]-osteoporosis  Prior dexa scans have showed osteoporosis     Osteoporosis    DEXA 01/21/2012: -2.1 spine and R fem, -1.8 L fem s/p fosamax  2004-2011 DEXA 04/18/14 @ [facility]: -2.5 - rec to start Prolia     Paroxysmal atrial fibrillation 06/19/2021   Patellofemoral pain syndrome 10/03/2013   Personal history of urinary calculi 09/27/2010   Prediabetes 05/02/2019   PVC's (premature ventricular contractions)    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   Complex valvuloplasty including PTFE neochord placement x6 with 30 mm Medtronic Simuform ring annuloplasty with clipping of LA appendage   Schatzki's ring 11/11/2010   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   Shingles outbreak 04/2013   Skin tag 10/28/2022   Spinal stenosis, lumbar region, with neurogenic claudication  06/09/2023   Trigger finger, left ring finger 05/22/2022   Injection given May 22, 2022 repeat injection given September 29, 2022     Trigger finger, right little finger 09/11/2017   Injected 09/11/2017.  Repeat injection given January 22, 2023     Trigger thumb of right hand 01/22/2023   Injection given January 22, 2023 repeat at the A1 pulley Apr 02, 2023     Varicose vein of leg    Past Surgical History:  Procedure Laterality Date   ANTERIOR CERVICAL DECOMP/DISCECTOMY FUSION N/A 05/06/2022   Procedure: CERVICAL THREE-FOUR, CERVICAL FOUR-FIVE  ANTERIOR CERVICAL DECOMPRESSION/DISCECTOMY FUSION;  Surgeon: Julio Sicks, MD;  Location: [facility] OR;  Service: Neurosurgery;  Laterality: N/A;   APPENDECTOMY  1958   BUBBLE STUDY  03/14/2021   Procedure: BUBBLE STUDY;  Surgeon: Quintella Reichert, MD;  Location[facility] ENDOSCOPY;  Service: Cardiovascular;;   CARDIAC CATHETERIZATION     CLIPPING OF ATRIAL APPENDAGE N/A 05/21/2021   Procedure: CLIPPING OF ATRIAL APPENDAGE USING ATRICURE PRO2 CLIP;  Surgeon: Purcell Nails, MD;  Location: [facility] OR;  Service: Open Heart Surgery;  Laterality: N/A;   COLONOSCOPY     CYSTOCELE REPAIR  2008   ESOPHAGOGASTRODUODENOSCOPY     Maxilofacial  1992   MITRAL VALVE REPAIR  05/21/2021   Procedure: MINIMALLY INVASIVE MITRAL VALVE REPAIR (MVR) USING MEDTRONIC SIMUFORM RING;  Surgeon: Purcell Nails, MD;  Location: [facility] OR;  Service: Open Heart Surgery;;   RIGHT/LEFT HEART CATH AND CORONARY ANGIOGRAPHY N/A 03/14/2021   Procedure: RIGHT/LEFT HEART CATH AND CORONARY ANGIOGRAPHY;  Surgeon: Runell Gess, MD;  Location: [facility] INVASIVE CV LAB;  Service: Cardiovascular;  Laterality: N/A;   TEE WITHOUT CARDIOVERSION N/A 03/14/2021   Procedure: TRANSESOPHAGEAL ECHOCARDIOGRAM (TEE);  Surgeon: Quintella Reichert, MD;  Location: [facility] ENDOSCOPY;  Service: Cardiovascular;  Laterality: N/A;   TEE WITHOUT CARDIOVERSION N/A 05/21/2021   Procedure: TRANSESOPHAGEAL ECHOCARDIOGRAM (TEE);  Surgeon: Purcell Nails, MD;  Location: [facility] OR;  Service: Open Heart Surgery;  Laterality: N/A;   TONSILLECTOMY  1960   Patient Active Problem List   Diagnosis Date Noted   Mild cognitive impairment 08/18/2023   Spinal stenosis, lumbar region, with neurogenic claudication 06/09/2023   Trigger thumb of right hand 01/22/2023   Cervicalgia 10/28/2022   Skin tag 10/28/2022   Acute bursitis of right shoulder 07/22/2022   Trigger finger, left ring finger 05/22/2022   Degenerative spondylolisthesis 05/06/2022   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, 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    Binocular vision disorder with diplopia 07/29/2021   Paroxysmal atrial fibrillation 06/19/2021   Long term (current) use of anticoagulants 05/29/2021   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   Mitral regurgitation 02/28/2021   Heart failure 02/21/2021   Murmur 02/21/2021   Abnormal CT scan of lung 02/06/2021   Chest pain 02/05/2021   Cardiomegaly 02/05/2021   Acute post-traumatic headache, not intractable 11/15/2019   Prediabetes 05/02/2019   Carbuncle of groin 02/25/2019   Cervicogenic headache 06/03/2018   Bilateral shoulder pain 12/11/2017   Gastritis 12/11/2017   Trigger finger, right little finger 09/11/2017   Dupuytren's contracture of hand 07/20/2017   Bilateral carpal tunnel syndrome 07/17/2017   Generalized osteoarthritis of multiple sites 06/30/2017   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   Chronic midline low back pain without sciatica 09/15/2016   Osteopenia 07/20/2016   Hyperlipidemia 02/09/2015   Bilateral primary osteoarthritis of knee 10/03/2013   Patellofemoral pain syndrome 10/03/2013   Varicose vein of leg  Allergic rhinitis    IBS (irritable bowel syndrome) 04/07/2011   Migraine headache 09/27/2010   Essential hypertension 09/27/2010   GERD (gastroesophageal reflux disease) 09/27/2010   Personal history of urinary calculi 09/27/2010    PCP: Dr Betty [place]  REFERRING PROVIDER: Dr Venita Lick  REFERRING DIAG: Vertebrogenic low back pain   Rationale for Evaluation and Treatment: Rehabilitation  THERAPY DIAG:  Chronic left sacroiliac joint pain  Muscle weakness (generalized)  ONSET DATE: increasing over last few months  SUBJECTIVE:                                                                                                                                                                                           SUBJECTIVE STATEMENT: Left lb/hip pain getting worse over the last few months.  Have OA everywhere which  may also adding into pain.  Am scheduled for a SIJ injection tomorrow by Dr Drucie Ip.  Want to get back to walking for an hour and being active without pain  PERTINENT HISTORY:  Shon Baton " no radicular pain pain L>R x many months getting progressively worse.  SIJ provocation maneuvers are grossly positive.  Order: eval and treat left SI pain 2-3 xweek for 3-4 weeks Headaches, HTN, oa knees; Mild neurocognitive disorder   PAIN:  Are you having pain? Yes: NPRS scale: current 3-4/10; worst 6-7/10; least 3-4/10 Pain location: left SIJ Pain description: heavy and hot Aggravating factors: prolonged standing and walking Relieving factors: walking; OTC meds; heat  PRECAUTIONS: None  RED FLAGS: None   WEIGHT BEARING RESTRICTIONS: No  FALLS:  Has patient fallen in last 6 months? No  LIVING ENVIRONMENT: Lives with: lives with their spouse Lives in: House/apartment Stairs: No Has following equipment at home: None  OCCUPATION: retired.  Walk with husband  PLOF: Independent  PATIENT GOALS: be able to get up from the floor; get stronger decrease pain  NEXT MD VISIT: Dr Drucie Ip 11/12/23  OBJECTIVE:  Note: Objective measures were completed at Evaluation unless otherwise noted.  DIAGNOSTIC FINDINGS:  DG Lumbar spine 03/2023 IMPRESSION: 1. Marked facet degenerative changes at the L3-4 through L5-S1 levels. 2. Mild grade 1 anterolisthesis at the L4-5 level and grade 1 anterolisthesis at the L5-S1 level.  PATIENT SURVEYS:  FOTO Primary score:40%; Risk adjusted 50% with goal of 57%  COGNITION: Overall cognitive status: Within functional limits for tasks assessed     SENSATION: WFL  MUSCLE LENGTH: Hamstrings: slightly limited tested in standing   POSTURE: rounded shoulders and forward head  PALPATION: Mod to minimal TTP left iliac crest are vs SIJ  LUMBAR ROM:   WFL  LOWER EXTREMITY ROM:  WFL  LOWER EXTREMITY MMT:    MMT Right eval Left eval  Hip flexion 30.8  28.8  Hip extension    Hip abduction 22.9 22.6  Hip adduction    Hip internal rotation    Hip external rotation    Knee flexion 42.2 39.3  Knee extension    Ankle dorsiflexion    Ankle plantarflexion    Ankle inversion    Ankle eversion     (Blank rows = not tested)  LUMBAR SPECIAL TESTS:  As per Dr Shon Baton: SIJ provocation maneuvers are grossly positive. Not retested.  FUNCTIONAL TESTS:  5 times sit to stand: 18.81 completed from bench at pool.  UE pushing off knees Timed up and go (TUG): 10.40 4 Stage balance passed 1-3.  SLS x 7 s  GAIT: Distance walked: unlimited  TODAY'S TREATMENT:                                                                                                                              eval   PATIENT EDUCATION:  Education details: Discussed eval findings, rehab rationale, aquatic program progression/POC and pools in area. Patient is in agreement  Person educated: Patient Education method: Explanation Education comprehension: verbalized understanding  HOME EXERCISE PROGRAM: TBA  ASSESSMENT:  CLINICAL IMPRESSION: Patient is a 77 y.o. f who was seen today for physical therapy evaluation and treatment for L SIJ pain.  Pt present with husband.  She is scheduled fora steroid injection tomorrow with Dr Drucie Ip.  Her pain sensitivity is minimal to moderae.  Pain decreased with extended walking (1 hour with husband for exercise).  She does have some limitation with completing household chores.  As per Dr Shon Baton SIJ provocation maneuvers are grossly positive (not retested).  She is an Community education officer. She will benefit from skilled aquatic PT intervention  OBJECTIVE IMPAIRMENTS: decreased activity tolerance, difficulty walking, and pain.   ACTIVITY LIMITATIONS: squatting and transfers  REHAB POTENTIAL: Good  CLINICAL DECISION MAKING: Evolving/moderate complexity  EVALUATION COMPLEXITY: Moderate   GOALS: Goals reviewed with patient? Yes  SHORT TERM  GOALS: Target date: 12/25/23  Pt to improve on Foto by at least 10% to reach her risk adjusted score demonstrating improved perception of functional ability. Baseline:40 Goal status: INITIAL  2.  Pt will improve strength hips by at least 5 lbs to demonstrate improved overall physical function Baseline:  Goal status: INITIAL  3.  Pt will report return to regular exercise program to demonstrate return to prior activity Baseline:  Goal status: INITIAL  4.  Pt will improve on 5 X STS test to <or=15s to demonstrate improving functional lower extremity strength, transitional movements, and balance Baseline: 18.81 Goal status: INITIAL  5.  Pt will be indep with aquatic HEP and will understand the benefits of pain management and how to strengthen using the properties of water to use as a tool to maintain wellness. Baseline:  Goal status: INITIAL   LONG TERM GOALS:  To be set at re-cert if approp   PLAN:  PT FREQUENCY: 1-2x/week  PT DURATION: 6 weeks expect 8 visits max  PLANNED INTERVENTIONS: 97164- PT Re-evaluation, 97110-Therapeutic exercises, 97530- Therapeutic activity, 97112- Neuromuscular re-education, 97535- Self Care, 97140- Manual therapy, [phone]- Gait training, [phone]- Orthotic Fit/training, [phone]- Aquatic Therapy, 97014- Electrical stimulation (unattended), [phone]- Ionotophoresis 4mg /ml Dexamethasone, Patient/Family education, Balance training, Stair training, Taping, Dry Needling, Joint mobilization, Vestibular training, DME instructions, Cryotherapy, and Moist heat.  PLAN FOR NEXT SESSION: aquatic: hip and core strengthening.  Balance retraining instruction on HEP, pain management   Rushie Chestnut) Jerelyn Trimarco MPT 11/11/23 1:19 PM [facility] [address] [place], [place], [address] Phone: [phone]   Fax:  [phone]

## 2023-11-12 DIAGNOSIS — M533 Sacrococcygeal disorders, not elsewhere classified: Secondary | ICD-10-CM | POA: Diagnosis not present

## 2023-11-16 ENCOUNTER — Ambulatory Visit: Payer: Medicare HMO | Attending: Cardiovascular Disease | Admitting: Cardiovascular Disease

## 2023-11-16 ENCOUNTER — Ambulatory Visit (INDEPENDENT_AMBULATORY_CARE_PROVIDER_SITE_OTHER): Payer: Medicare HMO | Admitting: *Deleted

## 2023-11-16 ENCOUNTER — Encounter: Payer: Self-pay | Admitting: Cardiovascular Disease

## 2023-11-16 VITALS — BP 138/82 | HR 67 | Ht 63.0 in | Wt 135.6 lb

## 2023-11-16 DIAGNOSIS — I10 Essential (primary) hypertension: Secondary | ICD-10-CM

## 2023-11-16 DIAGNOSIS — I340 Nonrheumatic mitral (valve) insufficiency: Secondary | ICD-10-CM

## 2023-11-16 DIAGNOSIS — Z9889 Other specified postprocedural states: Secondary | ICD-10-CM | POA: Diagnosis not present

## 2023-11-16 DIAGNOSIS — E782 Mixed hyperlipidemia: Secondary | ICD-10-CM

## 2023-11-16 DIAGNOSIS — I480 Paroxysmal atrial fibrillation: Secondary | ICD-10-CM | POA: Diagnosis not present

## 2023-11-16 DIAGNOSIS — Z7901 Long term (current) use of anticoagulants: Secondary | ICD-10-CM

## 2023-11-16 LAB — POCT INR: INR: 2.8 (ref 2.0–3.0)

## 2023-11-16 NOTE — Assessment & Plan Note (Signed)
History of PAF maintaining sinus rhythm on Coumadin anticoagulation

## 2023-11-16 NOTE — Assessment & Plan Note (Signed)
History of essential hypertension her blood pressure measured today at 138/82.  She is on amlodipine and metoprolol.

## 2023-11-16 NOTE — Progress Notes (Signed)
11/16/2023 [name] [name]   1946/01/31  [id number]  Primary Physician [place], Betty G, MD Primary Cardiologist: [name] Gess MD [name] [name], [place]  HPI:  [name] [name] is a 77 y.o.  married Caucasian female mother of 3 children, grandmother to 2 grandchildren, patient Dr. Cheryll [name] who saw Dr. Royann [name] remotely.  She is accompanied by her husband [name] [name] today.  She was referred for atypical chest pain.  I last saw her in the office 07/07/2022.Marland Kitchen  Her cardiac risk factor profile is notable for treated hypertension and mild hyperlipidemia. She has never had a heart attack or stroke. She is otherwise healthy except for GERD. She doesn't saw Dr. Royann [name] back in 2011 for atypical chest pain and workup was negative including a Myoview stress test. She was recently out of the country for several months and returned one month ago. Since that time she's had daily chest pain. She was under a lot of stress and she was away the pain itself sounds like GERD, begin subxiphoid and has no other characteristic symptoms of angina.   Since I saw her 2 years ago she is complained of increasing dyspnea on exertion.  She did have a negative Myoview stress test.  Recent 2D echo revealed severe MR.     She underwent right and left heart cath by myself 03/14/2021 revealing normal coronary arteries with a high V wave.  She also underwent TEE at the same time revealing normal LV function with a flail posterior leaflet and severe MR.  She saw Dr. Cornelius [name] at my request and underwent minimally invasive mitral valve repair on 05/21/2021 with a 38 mm Medtronic SIMUFORM  ring as well as left atrial appendage clipping.  She was discharged home on 05/26/2021.  She is recuperated nicely.  She was seen for postop visit at Dr. Orvan [name]  office and was complaining of some "fatigue".  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  Her breathing has significantly improved  compared to her symptoms preoperatively.  Her 2D echocardiogram performed 07/03/2021 showed a decline in her EF from 50 to 55% down to 40 to 45% although her MR has resolved.  Since I saw her in the office almost a year and a half ago she continues to do well.  She did have cervical disc surgery performed Dr. Dutch [name] for neck pain which has resolved.  She denies chest pain or shortness of breath.  Her most recent echo performed 07/21/2023 revealed an EF in the 45 to 50% range with no evidence of MR, unchanged from the prior echo.   Current Meds  Medication Sig   acetaminophen (TYLENOL) 650 MG CR tablet Take 650 mg by mouth every 8 (eight) hours as needed for pain (Headache).   amLODipine (NORVASC) 5 MG tablet Take 1 tablet (5 mg total) by mouth daily.   aspirin EC 81 MG EC tablet Take 1 tablet (81 mg total) by mouth daily. Swallow whole.   atorvastatin (LIPITOR) 40 MG tablet TAKE ONE TABLET BY MOUTH ONCE DAILY   Cyanocobalamin (VITAMIN B-12) 2500 MCG SUBL Place 2,500 mcg under the tongue daily.   gabapentin (NEURONTIN) 100 MG capsule Take 2 capsules (200 mg total) by mouth at bedtime.   metoprolol succinate (TOPROL-XL) 25 MG 24 hr tablet TAKE ONE TABLET BY MOUTH EVERY MORNING & TAKE ONE TABLET BY MOUTH EVERY EVENING   Peppermint Oil (IBGARD) 90 MG CPCR Use as needed (Patient  taking differently: Take 90 mg by mouth daily as needed (Stomach control). Use as needed)   Polyethyl Glycol-Propyl Glycol (SYSTANE) 0.4-0.3 % SOLN Place 1 drop into both eyes 2 (two) times daily.   warfarin (COUMADIN) 2 MG tablet Take 1-1.5 tablets (2-3 mg total) by mouth daily or as directed by Anticoagulation Clinic.     No Known Allergies  Social History   Socioeconomic History   Marital status: Married    Spouse name: [name] [name]   Number of children: 3   Years of education: 18   Highest education level: Master's degree (e.[name]., MA, MS, MEng, MEd, MSW, MBA)  Occupational History   Occupation: Retired  Tobacco Use    Smoking status: Never   Smokeless tobacco: Never  Vaping Use   Vaping status: Never Used  Substance and Sexual Activity   Alcohol use: No   Drug use: No   Sexual activity: Not on file  Other Topics Concern   Not on file  Social History Narrative   Married, lives with spouse. Retired Comptroller, Manufacturing systems engineer. Has 3 kids (moved to [place] to be closer)- youngest son MD. Linton Ham to [place] from [place] 06/2010, lived in [place] x 10years   Social Drivers of Health   Financial Resource Strain: Low Risk  (05/15/2023)   Overall Financial Resource Strain (CARDIA)    Difficulty of Paying Living Expenses: Not hard at all  Food Insecurity: No Food Insecurity (05/15/2023)   Hunger Vital Sign    Worried About Running Out of Food in the Last Year: Never true    Ran Out of Food in the Last Year: Never true  Transportation Needs: No Transportation Needs (05/15/2023)   PRAPARE - Administrator, Civil Service (Medical): No    Lack of Transportation (Non-Medical): No  Physical Activity: Insufficiently Active (05/15/2023)   Exercise Vital Sign    Days of Exercise per Week: 3 days    Minutes of Exercise per Session: 30 min  Stress: No Stress Concern Present (05/15/2023)   [employer] of Occupational Health - Occupational Stress Questionnaire    Feeling of Stress : Not at all  Social Connections: Socially Integrated (05/15/2023)   Social Connection and Isolation Panel [NHANES]    Frequency of Communication with Friends and Family: More than three times a week    Frequency of Social Gatherings with Friends and Family: More than three times a week    Attends Religious Services: More than 4 times per year    Active Member of [employer] or Organizations: Yes    Attends Engineer, structural: More than 4 times per year    Marital Status: Married  Catering manager Violence: Not At Risk (05/15/2023)   Humiliation, Afraid, Rape, and Kick questionnaire    Fear of Current or Ex-Partner: No     Emotionally Abused: No    Physically Abused: No    Sexually Abused: No     Review of Systems: General: negative for chills, fever, night sweats or weight changes.  Cardiovascular: negative for chest pain, dyspnea on exertion, edema, orthopnea, palpitations, paroxysmal nocturnal dyspnea or shortness of breath Dermatological: negative for rash Respiratory: negative for cough or wheezing Urologic: negative for hematuria Abdominal: negative for nausea, vomiting, diarrhea, bright red blood per rectum, melena, or hematemesis Neurologic: negative for visual changes, syncope, or dizziness All other systems reviewed and are otherwise negative except as noted above.    Blood pressure 138/82, pulse 67, height 5\' 3"  (1.6 m), weight 135 lb 9.6  oz (61.5 kg), SpO2 93%.  General appearance: alert and no distress Neck: no adenopathy, no carotid bruit, no JVD, supple, symmetrical, trachea midline, and thyroid not enlarged, symmetric, no tenderness/mass/nodules Lungs: clear to auscultation bilaterally Heart: regular rate and rhythm, S1, S2 normal, no murmur, click, rub or gallop Extremities: extremities normal, atraumatic, no cyanosis or edema Pulses: 2+ and symmetric Skin: Skin color, texture, turgor normal. No rashes or lesions Neurologic: Grossly normal  EKG EKG Interpretation Date/Time:  Monday November 16 2023 11:48:45 EST Ventricular Rate:  68 PR Interval:  184 QRS Duration:  86 QT Interval:  392 QTC Calculation: 416 R Axis:   21  Text Interpretation: Normal sinus rhythm Normal ECG When compared with ECG of 29-Jul-2021 12:49, PREVIOUS ECG IS PRESENT Confirmed by Nanetta Batty [phone]) on 11/16/2023 12:00:15 PM    ASSESSMENT AND PLAN:   Essential hypertension History of essential hypertension her blood pressure measured today at 138/82.  She is on amlodipine and metoprolol.  S/P minimally-invasive mitral valve repair History of severe mitral irritation status post minimally invasive  mitral valve repair by Dr. Cornelius [name] at my request 05/21/2021 with a 30 mm Medtronic SIMUFORM ring as well as left atrial appendage clipping.  Her EF is settled in the 45 to 50% range with no evidence of MR by recent 2D echo performed 07/21/2023.  She is clinically improved as a result of her surgical procedure.  Paroxysmal atrial fibrillation History of PAF maintaining sinus rhythm on Coumadin anticoagulation.  Hyperlipidemia History of hyperlipidemia on statin therapy.  Will recheck a lipid liver profile.     [name] Gess MD FACP,FACC,FAHA, [facility] 11/16/2023 12:08 PM

## 2023-11-16 NOTE — Assessment & Plan Note (Signed)
History of hyperlipidemia on statin therapy.  Will recheck a lipid liver profile 

## 2023-11-16 NOTE — Assessment & Plan Note (Signed)
History of severe mitral irritation status post minimally invasive mitral valve repair by Dr. Cornelius Moras at my request 05/21/2021 with a 30 mm Medtronic SIMUFORM ring as well as left atrial appendage clipping.  Her EF is settled in the 45 to 50% range with no evidence of MR by recent 2D echo performed 07/21/2023.  She is clinically improved as a result of her surgical procedure.

## 2023-11-16 NOTE — Patient Instructions (Signed)
Description   Continue taking warfarin 1.5 tablets daily except for 1 tablet on Wednesday. Recheck INR in 5 weeks. Coumadin Clinic [phone]; Procedure Fax Number [phone] or [phone] Lumbar Injection TBD will need Lovenox Bridge.

## 2023-11-16 NOTE — Patient Instructions (Addendum)
Medication Instructions:  Your physician recommends that you continue on your current medications as directed. Please refer to the Current Medication list given to you today.  *If you need a refill on your cardiac medications before your next appointment, please call your pharmacy*   Lab Work: Your physician recommends that you return for lab work in: the next 1-2 weeks for FASTING CMET, lipids and A1C  If you have labs (blood work) drawn today and your tests are completely normal, you will receive your results only by: MyChart Message (if you have MyChart) OR A paper copy in the mail If you have any lab test that is abnormal or we need to change your treatment, we will call you to review the results.   Testing/Procedures: Your physician has requested that you have an echocardiogram. Echocardiography is a painless test that uses sound waves to create images of your heart. It provides your doctor with information about the size and shape of your heart and how well your heart's chambers and valves are working. This procedure takes approximately one hour. There are no restrictions for this procedure. Please do NOT wear cologne, perfume, aftershave, or lotions (deodorant is allowed). Please arrive 15 minutes prior to your appointment time.  **To do in August 2025**  Please note: We ask at that you not bring children with you during ultrasound (echo/ vascular) testing. Due to room size and safety concerns, children are not allowed in the ultrasound rooms during exams. Our front office staff cannot provide observation of children in our lobby area while testing is being conducted. An adult accompanying a patient to their appointment will only be allowed in the ultrasound room at the discretion of the ultrasound technician under special circumstances. We apologize for any inconvenience.    Follow-Up: At [facility], you and your health needs are our priority.  As part of our continuing  mission to provide you with exceptional heart care, we have created designated Provider Care Teams.  These Care Teams include your primary Cardiologist (physician) and Advanced Practice Providers (APPs -  Physician Assistants and Nurse Practitioners) who all work together to provide you with the care you need, when you need it.  We recommend signing up for the patient portal called "MyChart".  Sign up information is provided on this After Visit Summary.  MyChart is used to connect with patients for Virtual Visits (Telemedicine).  Patients are able to view lab/test results, encounter notes, upcoming appointments, etc.  Non-urgent messages can be sent to your provider as well.   To learn more about what you can do with MyChart, go to [email or web address].    Your next appointment:   12 month(s)  Provider:   Nanetta Batty, MD

## 2023-11-20 ENCOUNTER — Ambulatory Visit (HOSPITAL_BASED_OUTPATIENT_CLINIC_OR_DEPARTMENT_OTHER): Payer: Medicare HMO | Admitting: Physical Therapy

## 2023-11-27 ENCOUNTER — Ambulatory Visit (HOSPITAL_BASED_OUTPATIENT_CLINIC_OR_DEPARTMENT_OTHER): Payer: Medicare HMO | Attending: Orthopedic Surgery | Admitting: Physical Therapy

## 2023-11-27 ENCOUNTER — Encounter (HOSPITAL_BASED_OUTPATIENT_CLINIC_OR_DEPARTMENT_OTHER): Payer: Self-pay | Admitting: Physical Therapy

## 2023-11-27 DIAGNOSIS — M533 Sacrococcygeal disorders, not elsewhere classified: Secondary | ICD-10-CM | POA: Insufficient documentation

## 2023-11-27 DIAGNOSIS — G8929 Other chronic pain: Secondary | ICD-10-CM | POA: Diagnosis present

## 2023-11-27 DIAGNOSIS — M6281 Muscle weakness (generalized): Secondary | ICD-10-CM | POA: Insufficient documentation

## 2023-11-27 NOTE — Therapy (Signed)
 OUTPATIENT PHYSICAL THERAPY THORACOLUMBAR EVALUATION   [name] MRN: [id number] DOB:04-07-1946, 78 y.o., female Today's Date: 11/27/2023  END OF SESSION:  PT End of Session - 11/27/23 1214     Visit Number 2    Number of Visits 8    Date for PT Re-Evaluation 12/25/23    Authorization Type aetna mcr    Progress Note Due on Visit 10    PT Start Time 0815    PT Stop Time 0855    PT Time Calculation (min) 40 min    Activity Tolerance Patient tolerated treatment well    Behavior During Therapy [facility] for tasks assessed/performed              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   Dr Waylon, Veverly Persons, Dr Skeet     Abnormal CT scan of lung 02/06/2021   Acute bursitis of right shoulder 07/22/2022   Injection given July 22, 2022     Acute post-traumatic headache, not intractable 11/15/2019   Allergic rhinitis    Atypical chest pain    Bilateral carpal tunnel syndrome 07/17/2017   EMG, Modena Callander, M.D  06/2017     Bilateral primary osteoarthritis of knee 10/03/2013   Bilateral injections given May 09, 2020     Bilateral shoulder pain 12/11/2017   Binocular vision disorder with diplopia 07/29/2021   Carbuncle of groin 02/25/2019   Cardiomegaly 02/05/2021   Cervicalgia 10/28/2022   Cervicogenic headache 06/03/2018   Chest pain 02/05/2021   Chronic midline low back pain without sciatica 09/15/2016   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   Degenerative spondylolisthesis 05/06/2022   Diverticulosis    Dupuytren's contracture of hand 07/20/2017   Epigastric pain 12/26/2010   Essential hypertension 09/27/2010   Gastritis 11/08/2007, 04/12/2012   H pylori bx neg 03/2012   Generalized osteoarthritis of multiple sites 06/30/2017   GERD (gastroesophageal reflux disease) 11/08/2007   Heart failure 02/21/2021   Hepatic cyst    Hiatal hernia    11/08/2007, 04/12/2012   Hyperlipidemia    IBS (irritable  bowel syndrome)    diarrhea predom   Joint pain 06/03/2016   Lateral meniscal tear 10/03/2013   Degenerative seen on ultrasound October 03, 2013     Left lower quadrant abdominal pain 05/01/2020   Leg cramps 06/10/2017   Long term (current) use of anticoagulants 05/29/2021   Migraine headache 09/27/2010   Mild cognitive impairment 08/18/2023   Mitral regurgitation    Murmur 02/21/2021   Osteopenia 07/20/2016   dexa 01/2019: Spine -2.2, RFN -2.2, LFN -2.0, decrease since prior scan.. frax -12.5%, 3%-we will look into Prolia -if not covered then Reclast  DEXA from [place]-osteoporosis  Prior dexa scans have showed osteoporosis     Osteoporosis    DEXA 01/21/2012: -2.1 spine and R fem, -1.8 L fem s/p fosamax  2004-2011 DEXA 04/18/14 @ [facility]: -2.5 - rec to start Prolia      Paroxysmal atrial fibrillation 06/19/2021   Patellofemoral pain syndrome 10/03/2013   Personal history of urinary calculi 09/27/2010   Prediabetes 05/02/2019   PVC's (premature ventricular contractions)    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   Complex valvuloplasty including PTFE neochord placement x6 with 30 mm Medtronic Simuform ring annuloplasty with clipping of LA appendage   Schatzki's ring 11/11/2010   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   Shingles outbreak 04/2013   Skin tag 10/28/2022   Spinal stenosis, lumbar region, with neurogenic  claudication 06/09/2023   Trigger finger, left ring finger 05/22/2022   Injection given May 22, 2022 repeat injection given September 29, 2022     Trigger finger, right little finger 09/11/2017   Injected 09/11/2017.  Repeat injection given January 22, 2023     Trigger thumb of right hand 01/22/2023   Injection given January 22, 2023 repeat at the A1 pulley Apr 02, 2023     Varicose vein of leg    Past Surgical History:  Procedure Laterality Date   ANTERIOR CERVICAL DECOMP/DISCECTOMY FUSION N/A 05/06/2022   Procedure: CERVICAL THREE-FOUR, CERVICAL FOUR-FIVE  ANTERIOR CERVICAL DECOMPRESSION/DISCECTOMY FUSION;  Surgeon: Louis Shove, MD;  Location: [facility] OR;  Service: Neurosurgery;  Laterality: N/A;   APPENDECTOMY  1958   BUBBLE STUDY  03/14/2021   Procedure: BUBBLE STUDY;  Surgeon: Shlomo Wilbert SAUNDERS, MD;  Location: [facility] ENDOSCOPY;  Service: Cardiovascular;;   CARDIAC CATHETERIZATION     CLIPPING OF ATRIAL APPENDAGE N/A 05/21/2021   Procedure: CLIPPING OF ATRIAL APPENDAGE USING ATRICURE PRO2 CLIP;  Surgeon: Dusty Sudie DEL, MD;  Location: [facility] OR;  Service: Open Heart Surgery;  Laterality: N/A;   COLONOSCOPY     CYSTOCELE REPAIR  2008   ESOPHAGOGASTRODUODENOSCOPY     Maxilofacial  1992   MITRAL VALVE REPAIR  05/21/2021   Procedure: MINIMALLY INVASIVE MITRAL VALVE REPAIR (MVR) USING MEDTRONIC SIMUFORM RING;  Surgeon: Dusty Sudie DEL, MD;  Location: [facility] OR;  Service: Open Heart Surgery;;   RIGHT/LEFT HEART CATH AND CORONARY ANGIOGRAPHY N/A 03/14/2021   Procedure: RIGHT/LEFT HEART CATH AND CORONARY ANGIOGRAPHY;  Surgeon: Court Dorn PARAS, MD;  Location: [facility] INVASIVE CV LAB;  Service: Cardiovascular;  Laterality: N/A;   TEE WITHOUT CARDIOVERSION N/A 03/14/2021   Procedure: TRANSESOPHAGEAL ECHOCARDIOGRAM (TEE);  Surgeon: Shlomo Wilbert SAUNDERS, MD;  Location: [facility] ENDOSCOPY;  Service: Cardiovascular;  Laterality: N/A;   TEE WITHOUT CARDIOVERSION N/A 05/21/2021   Procedure: TRANSESOPHAGEAL ECHOCARDIOGRAM (TEE);  Surgeon: Dusty Sudie DEL, MD;  Location: [facility] OR;  Service: Open Heart Surgery;  Laterality: N/A;   TONSILLECTOMY  1960   Patient Active Problem List   Diagnosis Date Noted   Mild cognitive impairment 08/18/2023   Spinal stenosis, lumbar region, with neurogenic claudication 06/09/2023   Trigger thumb of right hand 01/22/2023   Cervicalgia 10/28/2022   Skin tag 10/28/2022   Acute bursitis of right shoulder 07/22/2022   Trigger finger, left ring finger 05/22/2022   Degenerative spondylolisthesis 05/06/2022   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, 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    Binocular vision disorder with diplopia 07/29/2021   Paroxysmal atrial fibrillation 06/19/2021   Long term (current) use of anticoagulants 05/29/2021   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   Mitral regurgitation 02/28/2021   Heart failure 02/21/2021   Murmur 02/21/2021   Abnormal CT scan of lung 02/06/2021   Chest pain 02/05/2021   Cardiomegaly 02/05/2021   Acute post-traumatic headache, not intractable 11/15/2019   Prediabetes 05/02/2019   Carbuncle of groin 02/25/2019   Cervicogenic headache 06/03/2018   Bilateral shoulder pain 12/11/2017   Gastritis 12/11/2017   Trigger finger, right little finger 09/11/2017   Dupuytren's contracture of hand 07/20/2017   Bilateral carpal tunnel syndrome 07/17/2017   Generalized osteoarthritis of multiple sites 06/30/2017   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   Chronic midline low back pain without sciatica 09/15/2016   Osteopenia 07/20/2016   Hyperlipidemia 02/09/2015   Bilateral primary osteoarthritis of knee 10/03/2013   Patellofemoral pain syndrome 10/03/2013   Varicose vein of leg  Allergic rhinitis    IBS (irritable bowel syndrome) 04/07/2011   Migraine headache 09/27/2010   Essential hypertension 09/27/2010   GERD (gastroesophageal reflux disease) 09/27/2010   Personal history of urinary calculi 09/27/2010    PCP: Dr Betty Jordan  REFERRING PROVIDER: Dr Donaciano Sprang  REFERRING DIAG: Vertebrogenic low back pain   Rationale for Evaluation and Treatment: Rehabilitation  THERAPY DIAG:  Chronic left sacroiliac joint pain  Muscle weakness (generalized)  ONSET DATE: increasing over last few months  SUBJECTIVE:                                                                                                                                                                                           SUBJECTIVE STATEMENT: Had injection without any reduction in pain.  Pain high today.  Have appt to see Dr  Sprang soon  Initial Subjective Left lb/hip pain getting worse over the last few months.  Have OA everywhere which may also adding into pain.  Am scheduled for a SIJ injection tomorrow by Dr Laqueta.  Want to get back to walking for an hour and being active without pain  PERTINENT HISTORY:  Sprang  no radicular pain pain L>R x many months getting progressively worse.  SIJ provocation maneuvers are grossly positive.  Order: eval and treat left SI pain 2-3 xweek for 3-4 weeks Headaches, HTN, oa knees; Mild neurocognitive disorder   PAIN:  Are you having pain? Yes: NPRS scale: current 7/10; worst 6-7/10; least 3-4/10 Pain location: left SIJ Pain description: heavy and hot Aggravating factors: prolonged standing and walking Relieving factors: walking; OTC meds; heat  PRECAUTIONS: None  RED FLAGS: None   WEIGHT BEARING RESTRICTIONS: No  FALLS:  Has patient fallen in last 6 months? No  LIVING ENVIRONMENT: Lives with: lives with their spouse Lives in: House/apartment Stairs: No Has following equipment at home: None  OCCUPATION: retired.  Walk with husband  PLOF: Independent  PATIENT GOALS: be able to get up from the floor; get stronger decrease pain  NEXT MD VISIT: Dr Laqueta 11/12/23  OBJECTIVE:  Note: Objective measures were completed at Evaluation unless otherwise noted.  DIAGNOSTIC FINDINGS:  DG Lumbar spine 03/2023 IMPRESSION: 1. Marked facet degenerative changes at the L3-4 through L5-S1 levels. 2. Mild grade 1 anterolisthesis at the L4-5 level and grade 1 anterolisthesis at the L5-S1 level.  PATIENT SURVEYS:  FOTO Primary score:40%; Risk adjusted 50% with goal of 57%  COGNITION: Overall cognitive status: Within functional limits for tasks assessed     SENSATION: WFL  MUSCLE LENGTH: Hamstrings: slightly limited tested in standing   POSTURE: rounded shoulders and forward head  PALPATION: Mod to minimal TTP left iliac crest are vs SIJ  LUMBAR ROM:    WFL  LOWER EXTREMITY ROM:     WFL  LOWER EXTREMITY MMT:    MMT Right eval Left eval  Hip flexion 30.8 28.8  Hip extension    Hip abduction 22.9 22.6  Hip adduction    Hip internal rotation    Hip external rotation    Knee flexion 42.2 39.3  Knee extension    Ankle dorsiflexion    Ankle plantarflexion    Ankle inversion    Ankle eversion     (Blank rows = not tested)  LUMBAR SPECIAL TESTS:  As per Dr Burnetta: SIJ provocation maneuvers are grossly positive. Not retested.  FUNCTIONAL TESTS:  5 times sit to stand: 18.81 completed from bench at pool.  UE pushing off knees Timed up and go (TUG): 10.40 4 Stage balance passed 1-3.  SLS x 7 s  GAIT: Distance walked: unlimited  TODAY'S TREATMENT:                                                                                                                               Pt seen for aquatic therapy today.  Treatment took place in water  3.5-4.75 ft in depth at the [facility] pool. Temp of water  was 91.  Pt entered/exited the pool via stairs and alternating pattern with hand rail.  *intro to setting *walking in 3.6 then 3.8 ft ue support barbell-> unsupported forward, back and side stepping.  Added ue add/abd with side stepping *instruction and completion on squatted positon ue support on wall for recover/decrease pain *Added yellow noodle wrapped posteriorly across chest. Progressed squatted position to decompression position.  VC on finding COB (difficult).  Moved noodle to anterior with better success.  - cycling (across pool x 2 x 2 widths.   - holding to corner: hip add/abd; hip flex/ext  *1/2 noodle pull down for TrA engagement.  Instruction on abd bracing throughout.  Wide stance then staggered x 5 reps   Pt requires the buoyancy and hydrostatic pressure of water  for support, and to offload joints by unweighting joint load by at least 50 % in navel deep water  and by at least 75-80% in chest to neck deep water .   Viscosity of the water  is needed for resistance of strengthening. Water  current perturbations provides challenge to standing balance requiring increased core activation.     PATIENT EDUCATION:  Education details: Discussed eval findings, rehab rationale, aquatic program progression/POC and pools in area. Patient is in agreement  Person educated: Patient Education method: Explanation Education comprehension: verbalized understanding  HOME EXERCISE PROGRAM: TBA  ASSESSMENT:  CLINICAL IMPRESSION: Pt demonstrates safety and indep in setting with therapist instructing from deck.  She does have some difficulty finding COB to gain decompression position.  With practice this improves.  She requires extra time with all activities to gain motor plan for execution. Once gained approp muscle  engagement ensues.  She does have  a full reduction of LB/SIJ pain end of session in and out of pool.  She is a good candidate from aquatic therapy intervention and will benefit from the properties of water  to progress towards meeting land based goals.  Initial Impression Patient is a 78 y.o. f who was seen today for physical therapy evaluation and treatment for L SIJ pain.  Pt present with husband.  She is scheduled fora steroid injection tomorrow with Dr Laqueta.  Her pain sensitivity is minimal to moderae.  Pain decreased with extended walking (1 hour with husband for exercise).  She does have some limitation with completing household chores.  As per Dr Burnetta SIJ provocation maneuvers are grossly positive (not retested).  She is an community education officer. She will benefit from skilled aquatic PT intervention  OBJECTIVE IMPAIRMENTS: decreased activity tolerance, difficulty walking, and pain.   ACTIVITY LIMITATIONS: squatting and transfers  REHAB POTENTIAL: Good  CLINICAL DECISION MAKING: Evolving/moderate complexity  EVALUATION COMPLEXITY: Moderate   GOALS: Goals reviewed with patient? Yes  SHORT TERM GOALS:  Target date: 12/25/23  Pt to improve on Foto by at least 10% to reach her risk adjusted score demonstrating improved perception of functional ability. Baseline:40 Goal status: INITIAL  2.  Pt will improve strength hips by at least 5 lbs to demonstrate improved overall physical function Baseline:  Goal status: INITIAL  3.  Pt will report return to regular exercise program to demonstrate return to prior activity Baseline:  Goal status: INITIAL  4.  Pt will improve on 5 X STS test to <or=15s to demonstrate improving functional lower extremity strength, transitional movements, and balance Baseline: 18.81 Goal status: INITIAL  5.  Pt will be indep with aquatic HEP and will understand the benefits of pain management and how to strengthen using the properties of water  to use as a tool to maintain wellness. Baseline:  Goal status: INITIAL   LONG TERM GOALS: To be set at re-cert if approp   PLAN:  PT FREQUENCY: 1-2x/week  PT DURATION: 6 weeks expect 8 visits max  PLANNED INTERVENTIONS: 97164- PT Re-evaluation, 97110-Therapeutic exercises, 97530- Therapeutic activity, 97112- Neuromuscular re-education, 97535- Self Care, 02859- Manual therapy, [phone]- Gait training, [phone]- Orthotic Fit/training, [phone]- Aquatic Therapy, 97014- Electrical stimulation (unattended), [phone]- Ionotophoresis 4mg /ml Dexamethasone , Patient/Family education, Balance training, Stair training, Taping, Dry Needling, Joint mobilization, Vestibular training, DME instructions, Cryotherapy, and Moist heat.  PLAN FOR NEXT SESSION: aquatic: hip and core strengthening.  Balance retraining instruction on HEP, pain management   Ronal Foots) Arleth Mccullar MPT 11/27/23 12:23 PM [facility] [address] [place], [place], [address] Phone: [phone]   Fax:  [phone]

## 2023-12-01 ENCOUNTER — Encounter (HOSPITAL_BASED_OUTPATIENT_CLINIC_OR_DEPARTMENT_OTHER): Payer: Self-pay | Admitting: Physical Therapy

## 2023-12-01 ENCOUNTER — Ambulatory Visit (HOSPITAL_BASED_OUTPATIENT_CLINIC_OR_DEPARTMENT_OTHER): Payer: Medicare HMO | Admitting: Physical Therapy

## 2023-12-01 DIAGNOSIS — G8929 Other chronic pain: Secondary | ICD-10-CM

## 2023-12-01 DIAGNOSIS — M6281 Muscle weakness (generalized): Secondary | ICD-10-CM

## 2023-12-01 DIAGNOSIS — M533 Sacrococcygeal disorders, not elsewhere classified: Secondary | ICD-10-CM | POA: Diagnosis not present

## 2023-12-01 DIAGNOSIS — M65342 Trigger finger, left ring finger: Secondary | ICD-10-CM | POA: Diagnosis not present

## 2023-12-01 NOTE — Therapy (Signed)
 OUTPATIENT PHYSICAL THERAPY THORACOLUMBAR TREATMENT   [name] MRN: [id number] DOB:1946/09/24, 78 y.o., female Today's Date: 12/01/2023  END OF SESSION:  PT End of Session - 12/01/23 0849     Visit Number 3    Number of Visits 8    Date for PT Re-Evaluation 12/25/23    Authorization Type [employer] mcr    Progress Note Due on Visit 10    PT Start Time [phone]    PT Stop Time 0935    PT Time Calculation (min) 42 min    Activity Tolerance Patient tolerated treatment well;No increased pain    Behavior During Therapy [facility] for tasks assessed/performed              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   Dr Waylon, Veverly Persons, Dr Skeet     Abnormal CT scan of lung 02/06/2021   Acute bursitis of right shoulder 07/22/2022   Injection given July 22, 2022     Acute post-traumatic headache, not intractable 11/15/2019   Allergic rhinitis    Atypical chest pain    Bilateral carpal tunnel syndrome 07/17/2017   EMG, Modena Callander, M.D  06/2017     Bilateral primary osteoarthritis of knee 10/03/2013   Bilateral injections given May 09, 2020     Bilateral shoulder pain 12/11/2017   Binocular vision disorder with diplopia 07/29/2021   Carbuncle of groin 02/25/2019   Cardiomegaly 02/05/2021   Cervicalgia 10/28/2022   Cervicogenic headache 06/03/2018   Chest pain 02/05/2021   Chronic midline low back pain without sciatica 09/15/2016   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   Degenerative spondylolisthesis 05/06/2022   Diverticulosis    Dupuytren's contracture of hand 07/20/2017   Epigastric pain 12/26/2010   Essential hypertension 09/27/2010   Gastritis 11/08/2007, 04/12/2012   H pylori bx neg 03/2012   Generalized osteoarthritis of multiple sites 06/30/2017   GERD (gastroesophageal reflux disease) 11/08/2007   Heart failure 02/21/2021   Hepatic cyst    Hiatal hernia    11/08/2007, 04/12/2012   Hyperlipidemia     IBS (irritable bowel syndrome)    diarrhea predom   Joint pain 06/03/2016   Lateral meniscal tear 10/03/2013   Degenerative seen on ultrasound October 03, 2013     Left lower quadrant abdominal pain 05/01/2020   Leg cramps 06/10/2017   Long term (current) use of anticoagulants 05/29/2021   Migraine headache 09/27/2010   Mild cognitive impairment 08/18/2023   Mitral regurgitation    Murmur 02/21/2021   Osteopenia 07/20/2016   dexa 01/2019: Spine -2.2, RFN -2.2, LFN -2.0, decrease since prior scan.. frax -12.5%, 3%-we will look into Prolia -if not covered then Reclast  DEXA from [place]-osteoporosis  Prior dexa scans have showed osteoporosis     Osteoporosis    DEXA 01/21/2012: -2.1 spine and R fem, -1.8 L fem s/p fosamax  2004-2011 DEXA 04/18/14 @ [facility]: -2.5 - rec to start Prolia      Paroxysmal atrial fibrillation 06/19/2021   Patellofemoral pain syndrome 10/03/2013   Personal history of urinary calculi 09/27/2010   Prediabetes 05/02/2019   PVC's (premature ventricular contractions)    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   Complex valvuloplasty including PTFE neochord placement x6 with 30 mm Medtronic Simuform ring annuloplasty with clipping of LA appendage   Schatzki's ring 11/11/2010   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   Shingles outbreak 04/2013   Skin tag 10/28/2022   Spinal stenosis, lumbar region,  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  Repeat injection given January 22, 2023     Trigger thumb of right hand 01/22/2023   Injection given January 22, 2023 repeat at the A1 pulley Apr 02, 2023     Varicose vein of leg    Past Surgical History:  Procedure Laterality Date   ANTERIOR CERVICAL DECOMP/DISCECTOMY FUSION N/A 05/06/2022   Procedure: CERVICAL THREE-FOUR,  CERVICAL FOUR-FIVE ANTERIOR CERVICAL DECOMPRESSION/DISCECTOMY FUSION;  Surgeon: Louis Shove, MD;  Location: [facility] OR;  Service: Neurosurgery;  Laterality: N/A;   APPENDECTOMY  1958   BUBBLE STUDY  03/14/2021   Procedure: BUBBLE STUDY;  Surgeon: Shlomo Wilbert SAUNDERS, MD;  Location: [facility] ENDOSCOPY;  Service: Cardiovascular;;   CARDIAC CATHETERIZATION     CLIPPING OF ATRIAL APPENDAGE N/A 05/21/2021   Procedure: CLIPPING OF ATRIAL APPENDAGE USING ATRICURE PRO2 CLIP;  Surgeon: Dusty Sudie DEL, MD;  Location: [facility] OR;  Service: Open Heart Surgery;  Laterality: N/A;   COLONOSCOPY     CYSTOCELE REPAIR  2008   ESOPHAGOGASTRODUODENOSCOPY     Maxilofacial  1992   MITRAL VALVE REPAIR  05/21/2021   Procedure: MINIMALLY INVASIVE MITRAL VALVE REPAIR (MVR) USING MEDTRONIC SIMUFORM RING;  Surgeon: Dusty Sudie DEL, MD;  Location: [facility] OR;  Service: Open Heart Surgery;;   RIGHT/LEFT HEART CATH AND CORONARY ANGIOGRAPHY N/A 03/14/2021   Procedure: RIGHT/LEFT HEART CATH AND CORONARY ANGIOGRAPHY;  Surgeon: Court Dorn PARAS, MD;  Location: [facility] INVASIVE CV LAB;  Service: Cardiovascular;  Laterality: N/A;   TEE WITHOUT CARDIOVERSION N/A 03/14/2021   Procedure: TRANSESOPHAGEAL ECHOCARDIOGRAM (TEE);  Surgeon: Shlomo Wilbert SAUNDERS, MD;  Location: [facility] ENDOSCOPY;  Service: Cardiovascular;  Laterality: N/A;   TEE WITHOUT CARDIOVERSION N/A 05/21/2021   Procedure: TRANSESOPHAGEAL ECHOCARDIOGRAM (TEE);  Surgeon: Dusty Sudie DEL, MD;  Location: [facility] OR;  Service: Open Heart Surgery;  Laterality: N/A;   TONSILLECTOMY  1960   Patient Active Problem List   Diagnosis Date Noted   Mild cognitive impairment 08/18/2023   Spinal stenosis, lumbar region, with neurogenic claudication 06/09/2023   Trigger thumb of right hand 01/22/2023   Cervicalgia 10/28/2022   Skin tag 10/28/2022   Acute bursitis of right shoulder 07/22/2022   Trigger finger, left ring finger 05/22/2022   Degenerative spondylolisthesis 05/06/2022   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, right  08/18/2021   Binocular vision disorder with diplopia 07/29/2021   Paroxysmal atrial fibrillation 06/19/2021   Long term (current) use of anticoagulants 05/29/2021   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   Mitral regurgitation 02/28/2021   Heart failure 02/21/2021   Murmur 02/21/2021   Abnormal CT scan of lung 02/06/2021   Chest pain 02/05/2021   Cardiomegaly 02/05/2021   Acute post-traumatic headache, not intractable 11/15/2019   Prediabetes 05/02/2019   Carbuncle of groin 02/25/2019   Cervicogenic headache 06/03/2018   Bilateral shoulder pain 12/11/2017   Gastritis 12/11/2017   Trigger finger, right little finger 09/11/2017   Dupuytren's contracture of hand 07/20/2017   Bilateral carpal tunnel syndrome 07/17/2017   Generalized osteoarthritis of multiple sites 06/30/2017   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   Chronic midline low back pain without sciatica 09/15/2016   Osteopenia 07/20/2016   Hyperlipidemia 02/09/2015   Bilateral primary osteoarthritis of knee 10/03/2013   Patellofemoral pain syndrome 10/03/2013   Varicose vein of  leg    Allergic rhinitis    IBS (irritable bowel syndrome) 04/07/2011   Migraine headache 09/27/2010   Essential hypertension 09/27/2010   GERD (gastroesophageal reflux disease) 09/27/2010   Personal history of urinary calculi 09/27/2010    PCP: Dr Betty Jordan  REFERRING PROVIDER: Dr Donaciano Sprang  REFERRING DIAG: Vertebrogenic low back pain   Rationale for Evaluation and Treatment: Rehabilitation  THERAPY DIAG:  Chronic left sacroiliac joint pain  Muscle weakness (generalized)  ONSET DATE: increasing over last few months  SUBJECTIVE:                                                                                                                                                                                           SUBJECTIVE STATEMENT: Pt reports that she had relief of pain for ~2+ days.  Pain gradually  returned.    POOL ACCESS: has silver sneakers, but not currently a member of facility  Initial Subjective Left lb/hip pain getting worse over the last few months.  Have OA everywhere which may also adding into pain.  Am scheduled for a SIJ injection tomorrow by Dr Laqueta.  Want to get back to walking for an hour and being active without pain  PERTINENT HISTORY:  Sprang  no radicular pain pain L>R x many months getting progressively worse.  SIJ provocation maneuvers are grossly positive.  Order: eval and treat left SI pain 2-3 xweek for 3-4 weeks Headaches, HTN, oa knees; Mild neurocognitive disorder   PAIN:  Are you having pain? Yes: NPRS scale: current 7/10 Pain location: left SIJ, and down into Lt glute Pain description: achy Aggravating factors: prolonged standing and walking Relieving factors: walking; OTC meds; heat  PRECAUTIONS: None  RED FLAGS: None   WEIGHT BEARING RESTRICTIONS: No  FALLS:  Has patient fallen in last 6 months? No  LIVING ENVIRONMENT: Lives with: lives with their spouse Lives in: House/apartment Stairs: No Has following equipment at home: None  OCCUPATION: retired.  Walk with husband  PLOF: Independent  PATIENT GOALS: be able to get up from the floor; get stronger decrease pain  NEXT MD VISIT: Dr Laqueta 11/12/23  OBJECTIVE:  Note: Objective measures were completed at Evaluation unless otherwise noted.  DIAGNOSTIC FINDINGS:  DG Lumbar spine 03/2023 IMPRESSION: 1. Marked facet degenerative changes at the L3-4 through L5-S1 levels. 2. Mild grade 1 anterolisthesis at the L4-5 level and grade 1 anterolisthesis at the L5-S1 level.  PATIENT SURVEYS:  FOTO Primary score:40%; Risk adjusted 50% with goal of 57%  COGNITION: Overall cognitive status: Within functional limits for tasks assessed     SENSATION: WFL  MUSCLE LENGTH: Hamstrings:  slightly limited tested in standing   POSTURE: rounded shoulders and forward  head  PALPATION: Mod to minimal TTP left iliac crest are vs SIJ  LUMBAR ROM:   WFL  LOWER EXTREMITY ROM:     WFL  LOWER EXTREMITY MMT:    MMT Right eval Left eval  Hip flexion 30.8 28.8  Hip extension    Hip abduction 22.9 22.6  Hip adduction    Hip internal rotation    Hip external rotation    Knee flexion 42.2 39.3  Knee extension    Ankle dorsiflexion    Ankle plantarflexion    Ankle inversion    Ankle eversion     (Blank rows = not tested)  LUMBAR SPECIAL TESTS:  As per Dr Burnetta: SIJ provocation maneuvers are grossly positive. Not retested.  FUNCTIONAL TESTS:  5 times sit to stand: 18.81 completed from bench at pool.  UE pushing off knees Timed up and go (TUG): 10.40 4 Stage balance passed 1-3.  SLS x 7 s  GAIT: Distance walked: unlimited  TODAY'S TREATMENT:                                                                                                                              Pt seen for aquatic therapy today.  Treatment took place in water  3.5-4.75 ft in depth at the [facility] pool. Temp of water  was 91.  Pt entered/exited the pool via stairs and alternating pattern with hand rail.  *walking in 3.8 ft UE support on hand floats-> unsupported forward/ backward with reciprocal arm swing  * side stepping with arm addct/ abdct with rainbow hand floats x 1 lap (challenge) *  farmer carry with bilat rainbow hand floats under water  marching forward/ backward  (numbness in R hand; stopped)  *UE On wall:  heel raises x 10,  hip abdct 2 x 5; hip ext x 10; alternating single leg clams x 10; squats x 10 * yellow noodle wrapped anterior across chest - holding corner -> without UE support: cycling forward 2 laps  *1/2 noodle pull down for TrA engagement.  Instruction on abd bracing throughout.  Wide stance x 8 then staggered stancex 5 reps * return to walking forward/ backward with reciprocal arm swing, side stepping with arm abdct/ addct, marching with  swinging arms * L stretch (with knees bent/straight); wag the tail * at stairs - hamstring stretch x 15s x 2 reps   PATIENT EDUCATION:  Education details: intro to aquatic therapy  Person educated: Patient Education method: Explanation demonstration  Education comprehension: verbalized understanding  HOME EXERCISE PROGRAM: TBA  ASSESSMENT:  CLINICAL IMPRESSION: Positive response to initial aquatic session with elimination of pain after session.  Pt reported elimination of pain while exercising in the water .   She requires demonstration and extra time with all activities to gain motor plan for execution. Once gained approp muscle engagement ensues. She requires frequent cues for more upright posture. Pt  will benefit from the properties of water  to progress towards meeting land based goals. Encouraged pt to check out facilities that accept silver sneakers, to begin creating plan for transition to independent HEP.     Initial Impression Patient is a 78 y.o. f who was seen today for physical therapy evaluation and treatment for L SIJ pain.  Pt present with husband.  She is scheduled fora steroid injection tomorrow with Dr Laqueta.  Her pain sensitivity is minimal to moderae.  Pain decreased with extended walking (1 hour with husband for exercise).  She does have some limitation with completing household chores.  As per Dr Burnetta SIJ provocation maneuvers are grossly positive (not retested).  She is an community education officer. She will benefit from skilled aquatic PT intervention  OBJECTIVE IMPAIRMENTS: decreased activity tolerance, difficulty walking, and pain.   ACTIVITY LIMITATIONS: squatting and transfers  REHAB POTENTIAL: Good  CLINICAL DECISION MAKING: Evolving/moderate complexity  EVALUATION COMPLEXITY: Moderate   GOALS: Goals reviewed with patient? Yes  SHORT TERM GOALS: Target date: 12/25/23  Pt to improve on Foto by at least 10% to reach her risk adjusted score demonstrating improved  perception of functional ability. Baseline:40 Goal status: INITIAL  2.  Pt will improve strength hips by at least 5 lbs to demonstrate improved overall physical function Baseline:  Goal status: INITIAL  3.  Pt will report return to regular exercise program to demonstrate return to prior activity Baseline:  Goal status: INITIAL  4.  Pt will improve on 5 X STS test to <or=15s to demonstrate improving functional lower extremity strength, transitional movements, and balance Baseline: 18.81 Goal status: INITIAL  5.  Pt will be indep with aquatic HEP and will understand the benefits of pain management and how to strengthen using the properties of water  to use as a tool to maintain wellness. Baseline:  Goal status: INITIAL   LONG TERM GOALS: To be set at re-cert if approp   PLAN:  PT FREQUENCY: 1-2x/week  PT DURATION: 6 weeks expect 8 visits max  PLANNED INTERVENTIONS: 97164- PT Re-evaluation, 97110-Therapeutic exercises, 97530- Therapeutic activity, 97112- Neuromuscular re-education, 97535- Self Care, [id number]- Manual therapy, [phone]- Gait training, [phone]- Orthotic Fit/training, [phone]- Aquatic Therapy, 97014- Electrical stimulation (unattended), [phone]- Ionotophoresis 4mg /ml Dexamethasone , Patient/Family education, Balance training, Stair training, Taping, Dry Needling, Joint mobilization, Vestibular training, DME instructions, Cryotherapy, and Moist heat.  PLAN FOR NEXT SESSION: aquatic: hip and core strengthening.  Balance retraining instruction on HEP, pain management  Delon Aquas, PTA 12/01/23 9:38 AM [facility] [address] [place], [place], [address] Phone: [phone]   Fax:  [phone]

## 2023-12-03 ENCOUNTER — Other Ambulatory Visit: Payer: Self-pay

## 2023-12-03 ENCOUNTER — Other Ambulatory Visit: Payer: Self-pay | Admitting: Cardiovascular Disease

## 2023-12-03 ENCOUNTER — Other Ambulatory Visit: Payer: Self-pay | Admitting: Family Medicine

## 2023-12-03 ENCOUNTER — Other Ambulatory Visit (HOSPITAL_COMMUNITY): Payer: Self-pay

## 2023-12-03 LAB — COMPREHENSIVE METABOLIC PANEL
ALT: 17 [IU]/L (ref 0–32)
AST: 24 [IU]/L (ref 0–40)
Albumin: 4.2 g/dL (ref 3.8–4.8)
Alkaline Phosphatase: 50 [IU]/L (ref 44–121)
BUN/Creatinine Ratio: 23 (ref 12–28)
BUN: 18 mg/dL (ref 8–27)
Bilirubin Total: 0.5 mg/dL (ref 0.0–1.2)
CO2: 22 mmol/L (ref 20–29)
Calcium: 9.4 mg/dL (ref 8.7–10.3)
Chloride: 106 mmol/L (ref 96–106)
Creatinine, Ser: 0.79 mg/dL (ref 0.57–1.00)
Globulin, Total: 2 g/dL (ref 1.5–4.5)
Glucose: 114 mg/dL — ABNORMAL HIGH (ref 70–99)
Potassium: 4.2 mmol/L (ref 3.5–5.2)
Sodium: 142 mmol/L (ref 134–144)
Total Protein: 6.2 g/dL (ref 6.0–8.5)
eGFR: 77 mL/min/{1.73_m2} (ref >59)

## 2023-12-03 LAB — LIPID PANEL
Chol/HDL Ratio: 2.8 {ratio} (ref 0.0–4.4)
Cholesterol, Total: 177 mg/dL (ref 100–199)
HDL: 63 mg/dL (ref >39)
LDL Chol Calc (NIH): 100 mg/dL — ABNORMAL HIGH (ref 0–99)
Triglycerides: 72 mg/dL (ref 0–149)
VLDL Cholesterol Cal: 14 mg/dL (ref 5–40)

## 2023-12-03 LAB — HEMOGLOBIN A1C
Est. average glucose Bld gHb Est-mCnc: 120 mg/dL
Hgb A1c MFr Bld: 5.8 % — ABNORMAL HIGH (ref 4.8–5.6)

## 2023-12-03 LAB — SPECIMEN STATUS REPORT

## 2023-12-03 MED ORDER — METOPROLOL SUCCINATE ER 25 MG PO TB24
25.0000 mg | ORAL_TABLET | Freq: Two times a day (BID) | ORAL | 3 refills | Status: DC
  Filled 2023-12-03: qty 180, 90d supply, fill #0
  Filled 2024-03-07 (×2): qty 180, 90d supply, fill #1
  Filled 2024-05-30: qty 180, 90d supply, fill #2
  Filled 2024-09-12: qty 180, 90d supply, fill #3

## 2023-12-03 MED ORDER — ATORVASTATIN CALCIUM 40 MG PO TABS
40.0000 mg | ORAL_TABLET | Freq: Every day | ORAL | 3 refills | Status: DC
  Filled 2023-12-03: qty 90, 90d supply, fill #0

## 2023-12-03 MED ORDER — AMLODIPINE BESYLATE 5 MG PO TABS
5.0000 mg | ORAL_TABLET | Freq: Every day | ORAL | 3 refills | Status: DC
  Filled 2023-12-03: qty 90, 90d supply, fill #0
  Filled 2024-05-30: qty 90, 90d supply, fill #1
  Filled 2024-09-14: qty 90, 90d supply, fill #2

## 2023-12-04 ENCOUNTER — Ambulatory Visit (HOSPITAL_BASED_OUTPATIENT_CLINIC_OR_DEPARTMENT_OTHER): Payer: Medicare HMO | Admitting: Physical Therapy

## 2023-12-04 ENCOUNTER — Other Ambulatory Visit: Payer: Self-pay

## 2023-12-04 ENCOUNTER — Encounter (HOSPITAL_BASED_OUTPATIENT_CLINIC_OR_DEPARTMENT_OTHER): Payer: Self-pay | Admitting: Physical Therapy

## 2023-12-04 DIAGNOSIS — G8929 Other chronic pain: Secondary | ICD-10-CM

## 2023-12-04 DIAGNOSIS — M6281 Muscle weakness (generalized): Secondary | ICD-10-CM

## 2023-12-04 DIAGNOSIS — M533 Sacrococcygeal disorders, not elsewhere classified: Secondary | ICD-10-CM | POA: Diagnosis not present

## 2023-12-04 MED ORDER — GABAPENTIN 100 MG PO CAPS
100.0000 mg | ORAL_CAPSULE | Freq: Two times a day (BID) | ORAL | 0 refills | Status: DC
  Filled 2023-12-04 – 2023-12-11 (×2): qty 200, 100d supply, fill #0

## 2023-12-04 NOTE — Therapy (Signed)
 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   Dr Waylon, Veverly Persons, Dr Skeet     Abnormal CT scan of lung 02/06/2021   Acute bursitis of right shoulder 07/22/2022   Injection given July 22, 2022     Acute post-traumatic headache, not intractable 11/15/2019   Allergic rhinitis    Atypical chest pain    Bilateral carpal tunnel syndrome 07/17/2017   EMG, Modena Callander, M.D  06/2017     Bilateral primary osteoarthritis of knee 10/03/2013   Bilateral injections given May 09, 2020     Bilateral shoulder pain 12/11/2017   Binocular vision disorder with diplopia 07/29/2021   Carbuncle of groin 02/25/2019   Cardiomegaly 02/05/2021   Cervicalgia 10/28/2022   Cervicogenic headache 06/03/2018   Chest pain 02/05/2021   Chronic midline low back pain without sciatica 09/15/2016   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   Degenerative spondylolisthesis 05/06/2022   Diverticulosis    Dupuytren's contracture of hand 07/20/2017   Epigastric pain 12/26/2010   Essential hypertension 09/27/2010   Gastritis 11/08/2007, 04/12/2012   H pylori bx neg 03/2012   Generalized osteoarthritis of multiple sites 06/30/2017   GERD (gastroesophageal reflux disease) 11/08/2007   Heart failure 02/21/2021   Hepatic cyst    Hiatal hernia    11/08/2007, 04/12/2012   Hyperlipidemia    IBS (irritable bowel syndrome)    diarrhea predom   Joint pain  06/03/2016   Lateral meniscal tear 10/03/2013   Degenerative seen on ultrasound October 03, 2013     Left lower quadrant abdominal pain 05/01/2020   Leg cramps 06/10/2017   Long term (current) use of anticoagulants 05/29/2021   Migraine headache 09/27/2010   Mild cognitive impairment 08/18/2023   Mitral regurgitation    Murmur 02/21/2021   Osteopenia 07/20/2016   dexa 01/2019: Spine -2.2, RFN -2.2, LFN -2.0, decrease since prior scan.. frax -12.5%, 3%-we will look into Prolia -if not covered then Reclast  DEXA from [place]-osteoporosis  Prior dexa scans have showed osteoporosis     Osteoporosis    DEXA 01/21/2012: -2.1 spine and R fem, -1.8 L fem s/p fosamax  2004-2011 DEXA 04/18/14 @ [facility]: -2.5 - rec to start Prolia      Paroxysmal atrial fibrillation 06/19/2021   Patellofemoral pain syndrome 10/03/2013   Personal history of urinary calculi 09/27/2010   Prediabetes 05/02/2019   PVC's (premature ventricular contractions)    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   Complex valvuloplasty including PTFE neochord placement x6 with 30 mm Medtronic Simuform ring annuloplasty with clipping of LA appendage   Schatzki's ring 11/11/2010   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   Shingles outbreak 04/2013   Skin tag 10/28/2022   Spinal stenosis, lumbar region, with neurogenic claudication 06/09/2023   Trigger finger, left ring finger  05/22/2022   Injection given May 22, 2022 repeat injection given September 29, 2022     Trigger finger, right little finger 09/11/2017   Injected 09/11/2017.  Repeat injection given January 22, 2023     Trigger thumb of right hand 01/22/2023   Injection given January 22, 2023 repeat at the A1 pulley Apr 02, 2023     Varicose vein of leg    Past Surgical History:  Procedure Laterality Date   ANTERIOR CERVICAL DECOMP/DISCECTOMY FUSION N/A 05/06/2022   Procedure: CERVICAL THREE-FOUR, CERVICAL FOUR-FIVE ANTERIOR CERVICAL DECOMPRESSION/DISCECTOMY FUSION;   Surgeon: Louis Shove, MD;  Location: [facility] OR;  Service: Neurosurgery;  Laterality: N/A;   APPENDECTOMY  1958   BUBBLE STUDY  03/14/2021   Procedure: BUBBLE STUDY;  Surgeon: Shlomo Wilbert SAUNDERS, MD;  Location: [facility] ENDOSCOPY;  Service: Cardiovascular;;   CARDIAC CATHETERIZATION     CLIPPING OF ATRIAL APPENDAGE N/A 05/21/2021   Procedure: CLIPPING OF ATRIAL APPENDAGE USING ATRICURE PRO2 CLIP;  Surgeon: Dusty Sudie DEL, MD;  Location: [facility] OR;  Service: Open Heart Surgery;  Laterality: N/A;   COLONOSCOPY     CYSTOCELE REPAIR  2008   ESOPHAGOGASTRODUODENOSCOPY     Maxilofacial  1992   MITRAL VALVE REPAIR  05/21/2021   Procedure: MINIMALLY INVASIVE MITRAL VALVE REPAIR (MVR) USING MEDTRONIC SIMUFORM RING;  Surgeon: Dusty Sudie DEL, MD;  Location: [facility] OR;  Service: Open Heart Surgery;;   RIGHT/LEFT HEART CATH AND CORONARY ANGIOGRAPHY N/A 03/14/2021   Procedure: RIGHT/LEFT HEART CATH AND CORONARY ANGIOGRAPHY;  Surgeon: Court Dorn PARAS, MD;  Location: [facility] INVASIVE CV LAB;  Service: Cardiovascular;  Laterality: N/A;   TEE WITHOUT CARDIOVERSION N/A 03/14/2021   Procedure: TRANSESOPHAGEAL ECHOCARDIOGRAM (TEE);  Surgeon: Shlomo Wilbert SAUNDERS, MD;  Location: [facility] ENDOSCOPY;  Service: Cardiovascular;  Laterality: N/A;   TEE WITHOUT CARDIOVERSION N/A 05/21/2021   Procedure: TRANSESOPHAGEAL ECHOCARDIOGRAM (TEE);  Surgeon: Dusty Sudie DEL, MD;  Location: [facility] OR;  Service: Open Heart Surgery;  Laterality: N/A;   TONSILLECTOMY  1960   Patient Active Problem List   Diagnosis Date Noted   Mild cognitive impairment 08/18/2023   Spinal stenosis, lumbar region, with neurogenic claudication 06/09/2023   Trigger thumb of right hand 01/22/2023   Cervicalgia 10/28/2022   Skin tag 10/28/2022   Acute bursitis of right shoulder 07/22/2022   Trigger finger, left ring finger 05/22/2022   Degenerative spondylolisthesis 05/06/2022   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, 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   Binocular vision disorder with diplopia 07/29/2021    Paroxysmal atrial fibrillation 06/19/2021   Long term (current) use of anticoagulants 05/29/2021   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   Mitral regurgitation 02/28/2021   Heart failure 02/21/2021   Murmur 02/21/2021   Abnormal CT scan of lung 02/06/2021   Chest pain 02/05/2021   Cardiomegaly 02/05/2021   Acute post-traumatic headache, not intractable 11/15/2019   Prediabetes 05/02/2019   Carbuncle of groin 02/25/2019   Cervicogenic headache 06/03/2018   Bilateral shoulder pain 12/11/2017   Gastritis 12/11/2017   Trigger finger, right little finger 09/11/2017   Dupuytren's contracture of hand 07/20/2017   Bilateral carpal tunnel syndrome 07/17/2017   Generalized osteoarthritis of multiple sites 06/30/2017   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   Chronic midline low back pain without sciatica 09/15/2016   Osteopenia 07/20/2016   Hyperlipidemia 02/09/2015   Bilateral primary osteoarthritis of knee 10/03/2013   Patellofemoral pain syndrome 10/03/2013   Varicose vein of leg    Allergic rhinitis    IBS (irritable  bowel syndrome) 04/07/2011   Migraine headache 09/27/2010   Essential hypertension 09/27/2010   GERD (gastroesophageal reflux disease) 09/27/2010   Personal history of urinary calculi 09/27/2010    PCP: Dr Betty Jordan  REFERRING PROVIDER: Dr Donaciano Sprang  REFERRING DIAG: Vertebrogenic low back pain   Rationale for Evaluation and Treatment: Rehabilitation  THERAPY DIAG:  Chronic left sacroiliac joint pain  Muscle weakness (generalized)  ONSET DATE: increasing over last few months  SUBJECTIVE:                                                                                                                                                                                           SUBJECTIVE STATEMENT: Pt reports that she had relief of pain for 3 days.  Pain gradually returned.  Pt and husband plan to check out [employer] today and possibly  join.   POOL ACCESS: has silver sneakers, but not currently a member of facility  Initial Subjective Left lb/hip pain getting worse over the last few months.  Have OA everywhere which may also adding into pain.  Am scheduled for a SIJ injection tomorrow by Dr Laqueta.  Want to get back to walking for an hour and being active without pain  PERTINENT HISTORY:  Sprang  no radicular pain pain L>R x many months getting progressively worse.  SIJ provocation maneuvers are grossly positive.  Order: eval and treat left SI pain 2-3 xweek for 3-4 weeks Headaches, HTN, oa knees; Mild neurocognitive disorder   PAIN:  Are you having pain? Yes: NPRS scale: current 5/10 Pain location: left SIJ, and down into Lt glute Pain description: achy Aggravating factors: prolonged standing and walking Relieving factors: walking; OTC meds; heat  PRECAUTIONS: None  RED FLAGS: None   WEIGHT BEARING RESTRICTIONS: No  FALLS:  Has patient fallen in last 6 months? No  LIVING ENVIRONMENT: Lives with: lives with their spouse Lives in: House/apartment Stairs: No Has following equipment at home: None  OCCUPATION: retired.  Walk with husband  PLOF: Independent  PATIENT GOALS: be able to get up from the floor; get stronger decrease pain  NEXT MD VISIT: Dr Laqueta 11/12/23  OBJECTIVE:  Note: Objective measures were completed at Evaluation unless otherwise noted.  DIAGNOSTIC FINDINGS:  DG Lumbar spine 03/2023 IMPRESSION: 1. Marked facet degenerative changes at the L3-4 through L5-S1 levels. 2. Mild grade 1 anterolisthesis at the L4-5 level and grade 1 anterolisthesis at the L5-S1 level.  PATIENT SURVEYS:  FOTO Primary score:40%; Risk adjusted 50% with goal of 57%  COGNITION: Overall cognitive status: Within functional limits for tasks assessed     SENSATION: WFL  MUSCLE LENGTH:  Hamstrings: slightly limited tested in standing   POSTURE: rounded shoulders and forward head  PALPATION: Mod to  minimal TTP left iliac crest are vs SIJ  LUMBAR ROM:   WFL  LOWER EXTREMITY ROM:     WFL  LOWER EXTREMITY MMT:    MMT Right eval Left eval  Hip flexion 30.8 28.8  Hip extension    Hip abduction 22.9 22.6  Hip adduction    Hip internal rotation    Hip external rotation    Knee flexion 42.2 39.3  Knee extension    Ankle dorsiflexion    Ankle plantarflexion    Ankle inversion    Ankle eversion     (Blank rows = not tested)  LUMBAR SPECIAL TESTS:  As per Dr Burnetta: SIJ provocation maneuvers are grossly positive. Not retested.  FUNCTIONAL TESTS:  5 times sit to stand: 18.81 completed from bench at pool.  UE pushing off knees Timed up and go (TUG): 10.40 4 Stage balance passed 1-3.  SLS x 7 s  GAIT: Distance walked: unlimited  TODAY'S TREATMENT:                                                                                                                              Pt seen for aquatic therapy today.  Treatment took place in water  3.5-4.75 ft in depth at the [facility] pool. Temp of water  was 91.  Pt entered/exited the pool via stairs and alternating pattern with hand rail.  *walking unsupported forward/ backward with reciprocal arm swing x 2 laps -> with alternating row motion with rainbow hand floats x 3 laps  * side stepping with arm addct/ abdct with rainbow hand floats x 1 lap (improved) *  farmer carry with single rainbow hand float under water  walking forward/ backward   * without support : marching forward/backward-> in place -cues for upright posture *UE On wall:  heel raises x 10,  hip abdct 2 x 10; hip ext  2x 10; *1/2 noodle pull down for TrA engagement in standard stance x 10 * staggered stance with kickboard row x 10 each LE forward * alternating single leg clams x 10; squats x 10 * yellow noodle wrapped anterior across chest - holding corner -> without UE support: cycling forward 2 laps  * L stretch (with knees straight) * at stairs -  hamstring stretch x 15s    PATIENT EDUCATION:  Education details: intro to aquatic therapy  Person educated: Patient Education method: Explanation demonstration  Education comprehension: verbalized understanding  HOME EXERCISE PROGRAM: TBA  ASSESSMENT:  CLINICAL IMPRESSION: Pt requires frequent cues for more upright posture. She reports some increase in discomfort in low back with high knee marching with forward trunk posture; reduced with cues for upright trunk and decreased knee height.  Pt also reports increased discomfort with increased lumbar ext with hip ext.  Overall, good tolerance for session with significant reduction in pain during and after session.  Pt will benefit from the properties of water  to progress towards meeting land based goals.Will begin creating HEP for plan to transition to independent HE at local pool.     Initial Impression Patient is a 78 y.o. f who was seen today for physical therapy evaluation and treatment for L SIJ pain.  Pt present with husband.  She is scheduled fora steroid injection tomorrow with Dr Laqueta.  Her pain sensitivity is minimal to moderae.  Pain decreased with extended walking (1 hour with husband for exercise).  She does have some limitation with completing household chores.  As per Dr Burnetta SIJ provocation maneuvers are grossly positive (not retested).  She is an community education officer. She will benefit from skilled aquatic PT intervention  OBJECTIVE IMPAIRMENTS: decreased activity tolerance, difficulty walking, and pain.   ACTIVITY LIMITATIONS: squatting and transfers  REHAB POTENTIAL: Good  CLINICAL DECISION MAKING: Evolving/moderate complexity  EVALUATION COMPLEXITY: Moderate   GOALS: Goals reviewed with patient? Yes  SHORT TERM GOALS: Target date: 12/25/23  Pt to improve on Foto by at least 10% to reach her risk adjusted score demonstrating improved perception of functional ability. Baseline:40 Goal status: INITIAL  2.  Pt will  improve strength hips by at least 5 lbs to demonstrate improved overall physical function Baseline:  Goal status: INITIAL  3.  Pt will report return to regular exercise program to demonstrate return to prior activity Baseline:  Goal status: INITIAL  4.  Pt will improve on 5 X STS test to <or=15s to demonstrate improving functional lower extremity strength, transitional movements, and balance Baseline: 18.81 Goal status: INITIAL  5.  Pt will be indep with aquatic HEP and will understand the benefits of pain management and how to strengthen using the properties of water  to use as a tool to maintain wellness. Baseline:  Goal status: INITIAL   LONG TERM GOALS: To be set at re-cert if approp   PLAN:  PT FREQUENCY: 1-2x/week  PT DURATION: 6 weeks expect 8 visits max  PLANNED INTERVENTIONS: 97164- PT Re-evaluation, 97110-Therapeutic exercises, 97530- Therapeutic activity, 97112- Neuromuscular re-education, 97535- Self Care, 02859- Manual therapy, [phone]- Gait training, [phone]- Orthotic Fit/training, [phone]- Aquatic Therapy, 97014- Electrical stimulation (unattended), [phone]- Ionotophoresis 4mg /ml Dexamethasone , Patient/Family education, Balance training, Stair training, Taping, Dry Needling, Joint mobilization, Vestibular training, DME instructions, Cryotherapy, and Moist heat.  PLAN FOR NEXT SESSION: aquatic: hip and core strengthening.  Balance retraining instruction on HEP, pain management   Delon Aquas, PTA 12/04/23 10:43 AM [facility] [address] [place], [place], [address] Phone: [phone]   Fax:  [phone]

## 2023-12-08 ENCOUNTER — Ambulatory Visit (HOSPITAL_BASED_OUTPATIENT_CLINIC_OR_DEPARTMENT_OTHER): Payer: Medicare HMO | Admitting: Physical Therapy

## 2023-12-08 ENCOUNTER — Encounter (HOSPITAL_BASED_OUTPATIENT_CLINIC_OR_DEPARTMENT_OTHER): Payer: Self-pay | Admitting: Physical Therapy

## 2023-12-08 DIAGNOSIS — M6281 Muscle weakness (generalized): Secondary | ICD-10-CM

## 2023-12-08 DIAGNOSIS — M533 Sacrococcygeal disorders, not elsewhere classified: Secondary | ICD-10-CM | POA: Diagnosis not present

## 2023-12-08 DIAGNOSIS — G8929 Other chronic pain: Secondary | ICD-10-CM

## 2023-12-08 NOTE — Therapy (Signed)
 OUTPATIENT PHYSICAL THERAPY THORACOLUMBAR TREATMENT   [name] MRN: [id number] DOB:1946/02/06, 78 y.o., female Today's Date: 12/08/2023  END OF SESSION:  PT End of Session - 12/08/23 0858     Visit Number 5    Number of Visits 8    Date for PT Re-Evaluation 12/25/23    Authorization Type aetna mcr    Progress Note Due on Visit 10    PT Start Time 0900    PT Stop Time 0945    PT Time Calculation (min) 45 min    Activity Tolerance Patient tolerated treatment well    Behavior During Therapy [facility] for tasks assessed/performed              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   Dr Waylon, Veverly Persons, Dr Skeet     Abnormal CT scan of lung 02/06/2021   Acute bursitis of right shoulder 07/22/2022   Injection given July 22, 2022     Acute post-traumatic headache, not intractable 11/15/2019   Allergic rhinitis    Atypical chest pain    Bilateral carpal tunnel syndrome 07/17/2017   EMG, Modena Callander, M.D  06/2017     Bilateral primary osteoarthritis of knee 10/03/2013   Bilateral injections given May 09, 2020     Bilateral shoulder pain 12/11/2017   Binocular vision disorder with diplopia 07/29/2021   Carbuncle of groin 02/25/2019   Cardiomegaly 02/05/2021   Cervicalgia 10/28/2022   Cervicogenic headache 06/03/2018   Chest pain 02/05/2021   Chronic midline low back pain without sciatica 09/15/2016   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   Degenerative spondylolisthesis 05/06/2022   Diverticulosis    Dupuytren's contracture of hand 07/20/2017   Epigastric pain 12/26/2010   Essential hypertension 09/27/2010   Gastritis 11/08/2007, 04/12/2012   H pylori bx neg 03/2012   Generalized osteoarthritis of multiple sites 06/30/2017   GERD (gastroesophageal reflux disease) 11/08/2007   Heart failure 02/21/2021   Hepatic cyst    Hiatal hernia    11/08/2007, 04/12/2012   Hyperlipidemia    IBS (irritable  bowel syndrome)    diarrhea predom   Joint pain 06/03/2016   Lateral meniscal tear 10/03/2013   Degenerative seen on ultrasound October 03, 2013     Left lower quadrant abdominal pain 05/01/2020   Leg cramps 06/10/2017   Long term (current) use of anticoagulants 05/29/2021   Migraine headache 09/27/2010   Mild cognitive impairment 08/18/2023   Mitral regurgitation    Murmur 02/21/2021   Osteopenia 07/20/2016   dexa 01/2019: Spine -2.2, RFN -2.2, LFN -2.0, decrease since prior scan.. frax -12.5%, 3%-we will look into Prolia -if not covered then Reclast  DEXA from [place]-osteoporosis  Prior dexa scans have showed osteoporosis     Osteoporosis    DEXA 01/21/2012: -2.1 spine and R fem, -1.8 L fem s/p fosamax  2004-2011 DEXA 04/18/14 @ [facility]: -2.5 - rec to start Prolia      Paroxysmal atrial fibrillation 06/19/2021   Patellofemoral pain syndrome 10/03/2013   Personal history of urinary calculi 09/27/2010   Prediabetes 05/02/2019   PVC's (premature ventricular contractions)    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   Complex valvuloplasty including PTFE neochord placement x6 with 30 mm Medtronic Simuform ring annuloplasty with clipping of LA appendage   Schatzki's ring 11/11/2010   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   Shingles outbreak 04/2013   Skin tag 10/28/2022   Spinal stenosis, lumbar region, with neurogenic  claudication 06/09/2023   Trigger finger, left ring finger 05/22/2022   Injection given May 22, 2022 repeat injection given September 29, 2022     Trigger finger, right little finger 09/11/2017   Injected 09/11/2017.  Repeat injection given January 22, 2023     Trigger thumb of right hand 01/22/2023   Injection given January 22, 2023 repeat at the A1 pulley Apr 02, 2023     Varicose vein of leg    Past Surgical History:  Procedure Laterality Date   ANTERIOR CERVICAL DECOMP/DISCECTOMY FUSION N/A 05/06/2022   Procedure: CERVICAL THREE-FOUR, CERVICAL FOUR-FIVE  ANTERIOR CERVICAL DECOMPRESSION/DISCECTOMY FUSION;  Surgeon: Louis Shove, MD;  Location: [facility] OR;  Service: Neurosurgery;  Laterality: N/A;   APPENDECTOMY  1958   BUBBLE STUDY  03/14/2021   Procedure: BUBBLE STUDY;  Surgeon: Shlomo Wilbert SAUNDERS, MD;  Location: [facility] ENDOSCOPY;  Service: Cardiovascular;;   CARDIAC CATHETERIZATION     CLIPPING OF ATRIAL APPENDAGE N/A 05/21/2021   Procedure: CLIPPING OF ATRIAL APPENDAGE USING ATRICURE PRO2 CLIP;  Surgeon: Dusty Sudie DEL, MD;  Location: [facility] OR;  Service: Open Heart Surgery;  Laterality: N/A;   COLONOSCOPY     CYSTOCELE REPAIR  2008   ESOPHAGOGASTRODUODENOSCOPY     Maxilofacial  1992   MITRAL VALVE REPAIR  05/21/2021   Procedure: MINIMALLY INVASIVE MITRAL VALVE REPAIR (MVR) USING MEDTRONIC SIMUFORM RING;  Surgeon: Dusty Sudie DEL, MD;  Location: [facility] OR;  Service: Open Heart Surgery;;   RIGHT/LEFT HEART CATH AND CORONARY ANGIOGRAPHY N/A 03/14/2021   Procedure: RIGHT/LEFT HEART CATH AND CORONARY ANGIOGRAPHY;  Surgeon: Court Dorn PARAS, MD;  Location: [facility] INVASIVE CV LAB;  Service: Cardiovascular;  Laterality: N/A;   TEE WITHOUT CARDIOVERSION N/A 03/14/2021   Procedure: TRANSESOPHAGEAL ECHOCARDIOGRAM (TEE);  Surgeon: Shlomo Wilbert SAUNDERS, MD;  Location: [facility] ENDOSCOPY;  Service: Cardiovascular;  Laterality: N/A;   TEE WITHOUT CARDIOVERSION N/A 05/21/2021   Procedure: TRANSESOPHAGEAL ECHOCARDIOGRAM (TEE);  Surgeon: Dusty Sudie DEL, MD;  Location: [facility] OR;  Service: Open Heart Surgery;  Laterality: N/A;   TONSILLECTOMY  1960   Patient Active Problem List   Diagnosis Date Noted   Mild cognitive impairment 08/18/2023   Spinal stenosis, lumbar region, with neurogenic claudication 06/09/2023   Trigger thumb of right hand 01/22/2023   Cervicalgia 10/28/2022   Skin tag 10/28/2022   Acute bursitis of right shoulder 07/22/2022   Trigger finger, left ring finger 05/22/2022   Degenerative spondylolisthesis 05/06/2022   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, 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    Binocular vision disorder with diplopia 07/29/2021   Paroxysmal atrial fibrillation 06/19/2021   Long term (current) use of anticoagulants 05/29/2021   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   Mitral regurgitation 02/28/2021   Heart failure 02/21/2021   Murmur 02/21/2021   Abnormal CT scan of lung 02/06/2021   Chest pain 02/05/2021   Cardiomegaly 02/05/2021   Acute post-traumatic headache, not intractable 11/15/2019   Prediabetes 05/02/2019   Carbuncle of groin 02/25/2019   Cervicogenic headache 06/03/2018   Bilateral shoulder pain 12/11/2017   Gastritis 12/11/2017   Trigger finger, right little finger 09/11/2017   Dupuytren's contracture of hand 07/20/2017   Bilateral carpal tunnel syndrome 07/17/2017   Generalized osteoarthritis of multiple sites 06/30/2017   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   Chronic midline low back pain without sciatica 09/15/2016   Osteopenia 07/20/2016   Hyperlipidemia 02/09/2015   Bilateral primary osteoarthritis of knee 10/03/2013   Patellofemoral pain syndrome 10/03/2013   Varicose vein of leg  Allergic rhinitis    IBS (irritable bowel syndrome) 04/07/2011   Migraine headache 09/27/2010   Essential hypertension 09/27/2010   GERD (gastroesophageal reflux disease) 09/27/2010   Personal history of urinary calculi 09/27/2010    PCP: Dr Betty Jordan  REFERRING PROVIDER: Dr Donaciano Sprang  REFERRING DIAG: Vertebrogenic low back pain   Rationale for Evaluation and Treatment: Rehabilitation  THERAPY DIAG:  Chronic left sacroiliac joint pain  Muscle weakness (generalized)  ONSET DATE: increasing over last few months  SUBJECTIVE:                                                                                                                                                                                           SUBJECTIVE STATEMENT: Pt reports that she had relief of pain for 3 days.  Pain gradually returned.  Pt and  husband plan to check out [employer] today and possibly join.   POOL ACCESS: has silver sneakers, but not currently a member of facility  Initial Subjective Left lb/hip pain getting worse over the last few months.  Have OA everywhere which may also adding into pain.  Am scheduled for a SIJ injection tomorrow by Dr Laqueta.  Want to get back to walking for an hour and being active without pain  PERTINENT HISTORY:  Sprang  no radicular pain pain L>R x many months getting progressively worse.  SIJ provocation maneuvers are grossly positive.  Order: eval and treat left SI pain 2-3 xweek for 3-4 weeks Headaches, HTN, oa knees; Mild neurocognitive disorder   PAIN:  Are you having pain? Yes: NPRS scale: current 5/10 Pain location: left SIJ, and down into Lt glute Pain description: achy Aggravating factors: prolonged standing and walking Relieving factors: walking; OTC meds; heat  PRECAUTIONS: None  RED FLAGS: None   WEIGHT BEARING RESTRICTIONS: No  FALLS:  Has patient fallen in last 6 months? No  LIVING ENVIRONMENT: Lives with: lives with their spouse Lives in: House/apartment Stairs: No Has following equipment at home: None  OCCUPATION: retired.  Walk with husband  PLOF: Independent  PATIENT GOALS: be able to get up from the floor; get stronger decrease pain  NEXT MD VISIT: Dr Laqueta 11/12/23  OBJECTIVE:  Note: Objective measures were completed at Evaluation unless otherwise noted.  DIAGNOSTIC FINDINGS:  DG Lumbar spine 03/2023 IMPRESSION: 1. Marked facet degenerative changes at the L3-4 through L5-S1 levels. 2. Mild grade 1 anterolisthesis at the L4-5 level and grade 1 anterolisthesis at the L5-S1 level.  PATIENT SURVEYS:  FOTO Primary score:40%; Risk adjusted 50% with goal of 57%  COGNITION: Overall cognitive status: Within functional limits for tasks assessed  SENSATION: WFL  MUSCLE LENGTH: Hamstrings: slightly limited tested in standing   POSTURE: rounded  shoulders and forward head  PALPATION: Mod to minimal TTP left iliac crest are vs SIJ  LUMBAR ROM:   WFL  LOWER EXTREMITY ROM:     WFL  LOWER EXTREMITY MMT:    MMT Right eval Left eval  Hip flexion 30.8 28.8  Hip extension    Hip abduction 22.9 22.6  Hip adduction    Hip internal rotation    Hip external rotation    Knee flexion 42.2 39.3  Knee extension    Ankle dorsiflexion    Ankle plantarflexion    Ankle inversion    Ankle eversion     (Blank rows = not tested)  LUMBAR SPECIAL TESTS:  As per Dr Burnetta: SIJ provocation maneuvers are grossly positive. Not retested.  FUNCTIONAL TESTS:  5 times sit to stand: 18.81 completed from bench at pool.  UE pushing off knees Timed up and go (TUG): 10.40 4 Stage balance passed 1-3.  SLS x 7 s  GAIT: Distance walked: unlimited  TODAY'S TREATMENT:                                                                                                                              Self care: looked at [facility] schedule pt brought after visiting. Pointed out water  classes approp for pt to start.  Discussed frequency; progression of exercise; pain management.  Pt seen for aquatic therapy today.  Treatment took place in water  3.5-4.75 ft in depth at the [facility] pool. Temp of water  was 91.  Pt entered/exited the pool via stairs and alternating pattern with hand rail.  *walking unsupported forward/ backward with reciprocal arm swing  *HB carry using rainbow HB: forward, back and side stepping * side stepping with arm addct/ abdct with rainbow hand floats  - added slight lunge. Difficult * L stretch (with knees straight) *hip hinging with VC and min assist (difficult for pt to coord) *1/2 noodle pull down for TrA engagement in standard stance x 10 then staggered x 10 *UE progressed to yellow noodle:  heel raises x 10,  hip abdct x 10 ; hip ext/ext x 10; * staggered stance with kickboard row x 10 each LE forward * alternating  single leg clams x 10; squats x 10 * yellow noodle wrapped anterior across chest without UE support: cycling forward 4 laps   - ue support on corner wall: hip add/abd; flex/extension   PATIENT EDUCATION:  Education details: intro to aquatic therapy  Person educated: Patient Education method: Explanation demonstration  Education comprehension: verbalized understanding  HOME EXERCISE PROGRAM: Aquatic  This aquatic home exercise program from [employer] utilizes pictures from land based exercises, but has been adapted prior to lamination and issuance.   Access Code: [email or web address] URL: [email or web address] Date: 12/08/2023 Prepared by: Frankie Ellyana Crigler  Exercises - Hand Buoy Carry  - 1 x daily - 1-3 x weekly -  Side Stepping with Hand Floats  - 1 x daily - 1-3 x weekly - Standing 'L' Stretch at [employer]  - 1 x daily - 1-3 x weekly - 1 sets - 3 reps - Noodle press  - 1 x daily - 1-3 x weekly - 1-2 sets - 10 reps - Kick Board Row  - 1 x daily - 1-3 x weekly - 1-2 sets - 10 reps - Heel Toe Raises at Pool Wall  - 1 x daily - 1-3 x weekly - 1-2 sets - 10 reps - Standing Hip Flexion Extension at [employer]  - 1 x daily - 1-3 x weekly - 1-2 sets - 10 reps - Standing Hip Abduction Adduction at Pool Wall  - 1 x daily - 1-3 x weekly - 1-2 sets - 10 reps - Seated Straddle on Flotation Forward Breast Stroke Arms and Bicycle Legs  - 1 x daily - 1-3 x weekly Not issued ASSESSMENT:  CLINICAL IMPRESSION: HEP initiated. Pt has gained access [facility] pool [facility]. She does have a return of some back pain which completely reduces with aquatic intervention. She is encouraged to try an arthritis water  class.  She has some difficulty with more complicated exercises today due to lack of coordination.  She has improved with vertically suspended cycling which allows for good unloaded movement.  Plan to issue HEP at next session with tentative dc end of cert      Initial Impression Patient is a 78 y.o. f  who was seen today for physical therapy evaluation and treatment for L SIJ pain.  Pt present with husband.  She is scheduled fora steroid injection tomorrow with Dr Laqueta.  Her pain sensitivity is minimal to moderae.  Pain decreased with extended walking (1 hour with husband for exercise).  She does have some limitation with completing household chores.  As per Dr Burnetta SIJ provocation maneuvers are grossly positive (not retested).  She is an community education officer. She will benefit from skilled aquatic PT intervention  OBJECTIVE IMPAIRMENTS: decreased activity tolerance, difficulty walking, and pain.   ACTIVITY LIMITATIONS: squatting and transfers  REHAB POTENTIAL: Good  CLINICAL DECISION MAKING: Evolving/moderate complexity  EVALUATION COMPLEXITY: Moderate   GOALS: Goals reviewed with patient? Yes  SHORT TERM GOALS: Target date: 12/25/23  Pt to improve on Foto by at least 10% to reach her risk adjusted score demonstrating improved perception of functional ability. Baseline:40 Goal status: INITIAL  2.  Pt will improve strength hips by at least 5 lbs to demonstrate improved overall physical function Baseline:  Goal status: INITIAL  3.  Pt will report return to regular exercise program to demonstrate return to prior activity Baseline:  Goal status: INITIAL  4.  Pt will improve on 5 X STS test to <or=15s to demonstrate improving functional lower extremity strength, transitional movements, and balance Baseline: 18.81 Goal status: INITIAL  5.  Pt will be indep with aquatic HEP and will understand the benefits of pain management and how to strengthen using the properties of water  to use as a tool to maintain wellness. Baseline:  Goal status: INITIAL   LONG TERM GOALS: To be set at re-cert if approp   PLAN:  PT FREQUENCY: 1-2x/week  PT DURATION: 6 weeks expect 8 visits max  PLANNED INTERVENTIONS: 97164- PT Re-evaluation, 97110-Therapeutic exercises, 97530- Therapeutic activity,  [id number]- Neuromuscular re-education, 97535- Self Care, 02859- Manual therapy, [id number]- Gait training, [phone]- Orthotic Fit/training, [id number]- Aquatic Therapy, 97014- Electrical stimulation (unattended), [phone]- Ionotophoresis 4mg /ml Dexamethasone , Patient/Family  education, Balance training, Stair training, Taping, Dry Needling, Joint mobilization, Vestibular training, DME instructions, Cryotherapy, and Moist heat.  PLAN FOR NEXT SESSION: aquatic: hip and core strengthening.  Balance retraining instruction on HEP, pain management   Ronal Foots) Ulonda Klosowski MPT 12/08/23 9:45 AM [facility] [address] [place], [place], [address] Phone: [phone]   Fax:  [phone]

## 2023-12-09 ENCOUNTER — Telehealth: Payer: Self-pay | Admitting: Neurology

## 2023-12-09 NOTE — Telephone Encounter (Signed)
 Caller stated pharmacy will not release GABAPENTIN  medication that was scribed on 12/04/23. Patient is completely out of medication. [place] [facility]  [address], [place] [address]

## 2023-12-10 NOTE — Telephone Encounter (Signed)
Tried calling [facility] no answer. Unable to LVM.

## 2023-12-11 ENCOUNTER — Encounter (HOSPITAL_BASED_OUTPATIENT_CLINIC_OR_DEPARTMENT_OTHER): Payer: Self-pay | Admitting: Physical Therapy

## 2023-12-11 ENCOUNTER — Other Ambulatory Visit (HOSPITAL_COMMUNITY): Payer: Self-pay

## 2023-12-11 ENCOUNTER — Ambulatory Visit (HOSPITAL_BASED_OUTPATIENT_CLINIC_OR_DEPARTMENT_OTHER): Payer: Medicare HMO | Admitting: Physical Therapy

## 2023-12-11 DIAGNOSIS — G8929 Other chronic pain: Secondary | ICD-10-CM

## 2023-12-11 DIAGNOSIS — M6281 Muscle weakness (generalized): Secondary | ICD-10-CM

## 2023-12-11 DIAGNOSIS — M533 Sacrococcygeal disorders, not elsewhere classified: Secondary | ICD-10-CM | POA: Diagnosis not present

## 2023-12-11 NOTE — Telephone Encounter (Signed)
Patient advised RX is ready.    But will the patient need to be mail;ed or is the patient pick up.   [name] [name] at [facility] Per  patient please mail the RX to her home.

## 2023-12-11 NOTE — Therapy (Addendum)
 OUTPATIENT PHYSICAL THERAPY THORACOLUMBAR TREATMENT PHYSICAL THERAPY DISCHARGE SUMMARY  Visits from Start of Care: 6  Current functional level related to goals / functional outcomes: unknwon   Remaining deficits: unknown   Education / Equipment: Management of condition   Patient agrees to discharge. Patient goals were partially met. Patient is being discharged due to not returning since the last visit.  Addend [name] [name] MPT 04/13/24 11:07 AM [facility] [address] [place], [place], [address] Phone: [phone]   Fax:  [phone]    [name] MRN: [id number] DOB:Jul 27, 1946, 78 y.o., female Today's Date: 12/11/2023  END OF SESSION:  PT End of Session - 12/11/23 0950     Visit Number 6    Number of Visits 8    Date for PT Re-Evaluation 12/25/23    Authorization Type aetna mcr    Progress Note Due on Visit 10    PT Start Time [phone]    PT Stop Time 1030    PT Time Calculation (min) 42 min    Activity Tolerance Patient tolerated treatment well    Behavior During Therapy [facility] for tasks assessed/performed              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   Dr Durand Gift, Lindalee Retort, Dr Festus Hubert     Abnormal CT scan of lung 02/06/2021   Acute bursitis of right shoulder 07/22/2022   Injection given July 22, 2022     Acute post-traumatic headache, not intractable 11/15/2019   Allergic rhinitis    Atypical chest pain    Bilateral carpal tunnel syndrome 07/17/2017   EMG, Phebe Brasil, M.D  06/2017     Bilateral primary osteoarthritis of knee 10/03/2013   Bilateral injections given May 09, 2020     Bilateral shoulder pain 12/11/2017   Binocular vision disorder with diplopia 07/29/2021   Carbuncle of groin 02/25/2019   Cardiomegaly 02/05/2021   Cervicalgia 10/28/2022   Cervicogenic headache 06/03/2018   Chest pain 02/05/2021   Chronic  midline low back pain without sciatica 09/15/2016   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   Degenerative spondylolisthesis 05/06/2022   Diverticulosis    Dupuytren's contracture of hand 07/20/2017   Epigastric pain 12/26/2010   Essential hypertension 09/27/2010   Gastritis 11/08/2007, 04/12/2012   H pylori bx neg 03/2012   Generalized osteoarthritis of multiple sites 06/30/2017   GERD (gastroesophageal reflux disease) 11/08/2007   Heart failure 02/21/2021   Hepatic cyst    Hiatal hernia    11/08/2007, 04/12/2012   Hyperlipidemia    IBS (irritable bowel syndrome)    diarrhea predom   Joint pain 06/03/2016   Lateral meniscal tear 10/03/2013   Degenerative seen on ultrasound October 03, 2013     Left lower quadrant abdominal pain 05/01/2020   Leg cramps 06/10/2017   Long term (current) use of anticoagulants 05/29/2021   Migraine headache 09/27/2010   Mild cognitive impairment 08/18/2023   Mitral regurgitation    Murmur 02/21/2021   Osteopenia 07/20/2016   dexa 01/2019: Spine -2.2, RFN -2.2, LFN -2.0, decrease since prior scan.. frax -12.5%, 3%-we will look into Prolia -if not covered then Reclast  DEXA from [place]-osteoporosis  Prior dexa scans have showed osteoporosis     Osteoporosis    DEXA 01/21/2012: -2.1 spine and R fem, -1.8 L fem s/p fosamax  2004-2011 DEXA 04/18/14 @ [facility]: -2.5 - rec to start Prolia   Paroxysmal atrial fibrillation 06/19/2021   Patellofemoral pain syndrome 10/03/2013   Personal history of urinary calculi 09/27/2010   Prediabetes 05/02/2019   PVC's (premature ventricular contractions)    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   Complex valvuloplasty including PTFE neochord placement x6 with 30 mm Medtronic Simuform ring annuloplasty with clipping of LA appendage   Schatzki's ring 11/11/2010   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   Shingles outbreak 04/2013   Skin tag 10/28/2022   Spinal stenosis, lumbar region, with neurogenic  claudication 06/09/2023   Trigger finger, left ring finger 05/22/2022   Injection given May 22, 2022 repeat injection given September 29, 2022     Trigger finger, right little finger 09/11/2017   Injected 09/11/2017.  Repeat injection given January 22, 2023     Trigger thumb of right hand 01/22/2023   Injection given January 22, 2023 repeat at the A1 pulley Apr 02, 2023     Varicose vein of leg    Past Surgical History:  Procedure Laterality Date   ANTERIOR CERVICAL DECOMP/DISCECTOMY FUSION N/A 05/06/2022   Procedure: CERVICAL THREE-FOUR, CERVICAL FOUR-FIVE ANTERIOR CERVICAL DECOMPRESSION/DISCECTOMY FUSION;  Surgeon: Agustina Aldrich, MD;  Location: [facility] OR;  Service: Neurosurgery;  Laterality: N/A;   APPENDECTOMY  1958   BUBBLE STUDY  03/14/2021   Procedure: BUBBLE STUDY;  Surgeon: Jacqueline Matsu, MD;  Location: [facility] ENDOSCOPY;  Service: Cardiovascular;;   CARDIAC CATHETERIZATION     CLIPPING OF ATRIAL APPENDAGE N/A 05/21/2021   Procedure: CLIPPING OF ATRIAL APPENDAGE USING ATRICURE PRO2 CLIP;  Surgeon: [place]ia Jump, MD;  Location: [facility] OR;  Service: Open Heart Surgery;  Laterality: N/A;   COLONOSCOPY     CYSTOCELE REPAIR  2008   ESOPHAGOGASTRODUODENOSCOPY     Maxilofacial  1992   MITRAL VALVE REPAIR  05/21/2021   Procedure: MINIMALLY INVASIVE MITRAL VALVE REPAIR (MVR) USING MEDTRONIC SIMUFORM RING;  Surgeon: [place]ia Jump, MD;  Location: [facility] OR;  Service: Open Heart Surgery;;   RIGHT/LEFT HEART CATH AND CORONARY ANGIOGRAPHY N/A 03/14/2021   Procedure: RIGHT/LEFT HEART CATH AND CORONARY ANGIOGRAPHY;  Surgeon: Avanell Leigh, MD;  Location: [facility] INVASIVE CV LAB;  Service: Cardiovascular;  Laterality: N/A;   TEE WITHOUT CARDIOVERSION N/A 03/14/2021   Procedure: TRANSESOPHAGEAL ECHOCARDIOGRAM (TEE);  Surgeon: Jacqueline Matsu, MD;  Location[facility] ENDOSCOPY;  Service: Cardiovascular;  Laterality: N/A;   TEE WITHOUT CARDIOVERSION N/A 05/21/2021   Procedure: TRANSESOPHAGEAL ECHOCARDIOGRAM  (TEE);  Surgeon: [place] Jump, MD;  Location: [facility] OR;  Service: Open Heart Surgery;  Laterality: N/A;   TONSILLECTOMY  1960   Patient Active Problem List   Diagnosis Date Noted   Mild cognitive impairment 08/18/2023   Spinal stenosis, lumbar region, with neurogenic claudication 06/09/2023   Trigger thumb of right hand 01/22/2023   Cervicalgia 10/28/2022   Skin tag 10/28/2022   Acute bursitis of right shoulder 07/22/2022   Trigger finger, left ring finger 05/22/2022   Degenerative spondylolisthesis 05/06/2022   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, 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   Binocular vision disorder with diplopia 07/29/2021   Paroxysmal atrial fibrillation 06/19/2021   Long term (current) use of anticoagulants 05/29/2021   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   Mitral regurgitation 02/28/2021   Heart failure 02/21/2021   Murmur 02/21/2021   Abnormal CT scan of lung 02/06/2021   Chest pain 02/05/2021   Cardiomegaly 02/05/2021   Acute post-traumatic headache, not intractable 11/15/2019   Prediabetes 05/02/2019   Carbuncle of groin 02/25/2019   Cervicogenic headache 06/03/2018  Bilateral shoulder pain 12/11/2017   Gastritis 12/11/2017   Trigger finger, right little finger 09/11/2017   Dupuytren's contracture of hand 07/20/2017   Bilateral carpal tunnel syndrome 07/17/2017   Generalized osteoarthritis of multiple sites 06/30/2017   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   Chronic midline low back pain without sciatica 09/15/2016   Osteopenia 07/20/2016   Hyperlipidemia 02/09/2015   Bilateral primary osteoarthritis of knee 10/03/2013   Patellofemoral pain syndrome 10/03/2013   Varicose vein of leg    Allergic rhinitis    IBS (irritable bowel syndrome) 04/07/2011   Migraine headache 09/27/2010   Essential hypertension 09/27/2010   GERD (gastroesophageal reflux disease) 09/27/2010   Personal history of urinary calculi 09/27/2010    PCP: Dr Betty Swaziland  REFERRING  PROVIDER: Dr Mort Ards  REFERRING DIAG: Vertebrogenic low back pain   Rationale for Evaluation and Treatment: Rehabilitation  THERAPY DIAG:  Chronic left sacroiliac joint pain  Muscle weakness (generalized)  ONSET DATE: increasing over last few months  SUBJECTIVE:                                                                                                                                                                                           SUBJECTIVE STATEMENT: Pt reports reduction of pain back to 0/10 last 2 days.  They are waiting to hear back from [facility] as far as getting in a "silver Sneaker" group.  POOL ACCESS: has silver sneakers, but not currently a member of facility  Initial Subjective Left lb/hip pain getting worse over the last few months.  Have OA everywhere which may also adding into pain.  Am scheduled for a SIJ injection tomorrow by Dr Phares Brasher.  Want to get back to walking for an hour and being active without pain  PERTINENT HISTORY:  Vaughn Georges " no radicular pain pain L>R x many months getting progressively worse.  SIJ provocation maneuvers are grossly positive.  Order: eval and treat left SI pain 2-3 xweek for 3-4 weeks Headaches, HTN, oa knees; Mild neurocognitive disorder   PAIN:  Are you having pain? Yes: NPRS scale: current 0/10 Pain location: left SIJ, and down into Lt glute Pain description: achy Aggravating factors: prolonged standing and walking Relieving factors: walking; OTC meds; heat  PRECAUTIONS: None  RED FLAGS: None   WEIGHT BEARING RESTRICTIONS: No  FALLS:  Has patient fallen in last 6 months? No  LIVING ENVIRONMENT: Lives with: lives with their spouse Lives in: House/apartment Stairs: No Has following equipment at home: None  OCCUPATION: retired.  Walk with husband  PLOF: Independent  PATIENT GOALS: be able to get up from the floor;  get stronger decrease pain  NEXT MD VISIT: Dr Phares Brasher 11/12/23  OBJECTIVE:  Note:  Objective measures were completed at Evaluation unless otherwise noted.  DIAGNOSTIC FINDINGS:  DG Lumbar spine 03/2023 IMPRESSION: 1. Marked facet degenerative changes at the L3-4 through L5-S1 levels. 2. Mild grade 1 anterolisthesis at the L4-5 level and grade 1 anterolisthesis at the L5-S1 level.  PATIENT SURVEYS:  FOTO Primary score:40%; Risk adjusted 50% with goal of 57%  COGNITION: Overall cognitive status: Within functional limits for tasks assessed     SENSATION: WFL  MUSCLE LENGTH: Hamstrings: slightly limited tested in standing   POSTURE: rounded shoulders and forward head  PALPATION: Mod to minimal TTP left iliac crest are vs SIJ  LUMBAR ROM:   WFL  LOWER EXTREMITY ROM:     WFL  LOWER EXTREMITY MMT:    MMT Right eval Left eval  Hip flexion 30.8 28.8  Hip extension    Hip abduction 22.9 22.6  Hip adduction    Hip internal rotation    Hip external rotation    Knee flexion 42.2 39.3  Knee extension    Ankle dorsiflexion    Ankle plantarflexion    Ankle inversion    Ankle eversion     (Blank rows = not tested)  LUMBAR SPECIAL TESTS:  As per Dr Vaughn Georges: SIJ provocation maneuvers are grossly positive. Not retested.  FUNCTIONAL TESTS:  5 times sit to stand: 18.81 completed from bench at pool.  UE pushing off knees Timed up and go (TUG): 10.40 4 Stage balance passed 1-3.  SLS x 7 s  GAIT: Distance walked: unlimited  TODAY'S TREATMENT:                                                                                                                              Pt seen for aquatic therapy today.  Treatment took place in water 3.5-4.75 ft in depth at the [facility] pool. Temp of water was 91.  Pt entered/exited the pool via stairs and alternating pattern with hand rail.  *walking unsupported forward/ backward with reciprocal arm swing  *HB carry using rainbow HB: forward, back and side stepping * side stepping with arm addct/ abdct with  rainbow hand floats  - added slight lunge. Improved coordination * L stretch (no stretch) * yellow noodle wrapped anterior across chest without UE support: cycling forward 4 laps   - ue support on corner wall: hip add/abd; flex/extension 2x20reps *UE progressed to yellow noodle:  heel raises x 10,  hip abdct x5 then cross midline x 5 (good balance challenge)  *sitting balance on solid noodle Unable to hold position for longer than ~2-3 s.  Good core engagement throughout exercise. *1/2 noodle pull down for TrA engagement in standard stance x 10 then staggered x 10     PATIENT EDUCATION:  Education details: intro to aquatic therapy  Person educated: Patient Education method: Explanation demonstration  Education comprehension: verbalized understanding  HOME  EXERCISE PROGRAM: Aquatic  This aquatic home exercise program from [employer] utilizes pictures from land based exercises, but has been adapted prior to lamination and issuance.   Access Code: [email or web address] URL: [email or web address] Date: 12/08/2023 Prepared by: Aretta Kudo  Exercises - Hand Buoy Carry  - 1 x daily - 1-3 x weekly - Side Stepping with Hand Floats  - 1 x daily - 1-3 x weekly - Standing 'L' Stretch at [employer]  - 1 x daily - 1-3 x weekly - 1 sets - 3 reps - Noodle press  - 1 x daily - 1-3 x weekly - 1-2 sets - 10 reps - Kick Board Row  - 1 x daily - 1-3 x weekly - 1-2 sets - 10 reps - Heel Toe Raises at Pool Wall  - 1 x daily - 1-3 x weekly - 1-2 sets - 10 reps - Standing Hip Flexion Extension at [employer]  - 1 x daily - 1-3 x weekly - 1-2 sets - 10 reps - Standing Hip Abduction Adduction at Pool Wall  - 1 x daily - 1-3 x weekly - 1-2 sets - 10 reps - Seated Straddle on Flotation Forward Breast Stroke Arms and Bicycle Legs  - 1 x daily - 1-3 x weekly Not issued ASSESSMENT:  CLINICAL IMPRESSION: Progressed core strengthening with add balance challenges.  She has very good toleration. Cues for  decreased frustration with sitting balance on noodle and cross midline activities.  With practice all areas improve. HEP printed but not yet laminated or instructed.  Plan to complete next session. Completed foto and objective testing         Initial Impression Patient is a 78 y.o. f who was seen today for physical therapy evaluation and treatment for L SIJ pain.  Pt present with husband.  She is scheduled fora steroid injection tomorrow with Dr Phares Brasher.  Her pain sensitivity is minimal to moderae.  Pain decreased with extended walking (1 hour with husband for exercise).  She does have some limitation with completing household chores.  As per Dr Vaughn Georges SIJ provocation maneuvers are grossly positive (not retested).  She is an Community education officer. She will benefit from skilled aquatic PT intervention  OBJECTIVE IMPAIRMENTS: decreased activity tolerance, difficulty walking, and pain.   ACTIVITY LIMITATIONS: squatting and transfers  REHAB POTENTIAL: Good  CLINICAL DECISION MAKING: Evolving/moderate complexity  EVALUATION COMPLEXITY: Moderate   GOALS: Goals reviewed with patient? Yes  SHORT TERM GOALS: Target date: 12/25/23  Pt to improve on Foto by at least 10% to reach her risk adjusted score demonstrating improved perception of functional ability. Baseline:40 Goal status: INITIAL  2.  Pt will improve strength hips by at least 5 lbs to demonstrate improved overall physical function Baseline:  Goal status: INITIAL  3.  Pt will report return to regular exercise program to demonstrate return to prior activity Baseline:  Goal status: INITIAL  4.  Pt will improve on 5 X STS test to <or=15s to demonstrate improving functional lower extremity strength, transitional movements, and balance Baseline: 18.81 Goal status: INITIAL  5.  Pt will be indep with aquatic HEP and will understand the benefits of pain management and how to strengthen using the properties of water to use as a tool to  maintain wellness. Baseline:  Goal status: INITIAL   LONG TERM GOALS: To be set at re-cert if approp   PLAN:  PT FREQUENCY: 1-2x/week  PT DURATION: 6 weeks expect 8 visits max  PLANNED INTERVENTIONS: 40981- PT Re-evaluation,  97110-Therapeutic exercises, 97530- Therapeutic activity, [id number]- Neuromuscular re-education, [phone]- Self Care, 89381- Manual therapy, [phone]- Gait training, [phone]- Orthotic Fit/training, [phone]- Aquatic Therapy, [phone]- Electrical stimulation (unattended), [phone]- Ionotophoresis 4mg /ml Dexamethasone , Patient/Family education, Balance training, Stair training, Taping, Dry Needling, Joint mobilization, Vestibular training, DME instructions, Cryotherapy, and Moist heat.  PLAN FOR NEXT SESSION: aquatic: hip and core strengthening.  Balance retraining instruction on HEP, pain management   Lucinda Saber) Sherma Vanmetre MPT 12/11/23 9:51 AM [facility] [address] [place], [place], [address] Phone: [phone]   Fax:  [phone]

## 2023-12-15 DIAGNOSIS — M5451 Vertebrogenic low back pain: Secondary | ICD-10-CM | POA: Diagnosis not present

## 2023-12-15 DIAGNOSIS — M545 Low back pain, unspecified: Secondary | ICD-10-CM | POA: Insufficient documentation

## 2023-12-16 DIAGNOSIS — M542 Cervicalgia: Secondary | ICD-10-CM | POA: Insufficient documentation

## 2023-12-18 ENCOUNTER — Ambulatory Visit (HOSPITAL_BASED_OUTPATIENT_CLINIC_OR_DEPARTMENT_OTHER): Payer: Medicare HMO | Admitting: Physical Therapy

## 2023-12-18 NOTE — Progress Notes (Unsigned)
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  She is accompanied by her husband who supplements history.  UPDATE: Headaches: Current medications:  gabapentin 200mg  at bedtime Some improvement but still has burning and electric shocks in back of head to temple with neck flexion/extension.    Memory deficits: B12 in September was 1368.  Underwent neuropsychological evaluation on 08/18/2023 demonstrated normative deficits surrounding both receptive and expressive language and performance variability across processing speed and executive functioning meeting criteria for mild neurocognitive disorder, likely related to underlying cerebrovascular disease and ongoing pain/headaches, but not suggestive of underlying neurodegenerative disease.      HISTORY: Headaches: Onset:  She has history of migraines for many years.  She presents for this different headache which began in her 37s. Location:  Left greater than right temple Quality:  Electric shock Intensity:  10/10.  She denies new headache, thunderclap headache or severe headache that wakes her from sleep. Associated symptoms:  Usually no associated symptoms.  Sometimes there is associated conjunctival injection on side of pain.  Photophobia as well.  No associated neck pain.  She denies associated phonophobia, osmophobia, nausea, vomiting, visual disturbance or unilateral numbness or weakness. Duration:  5  seconds Frequency:  2 to 3 times a day and it may occur again in 2 to 4 weeks. Frequency of abortive medication: rarely Triggers:  Possibly sweet foods Relieving factors: Tylenol, Fioricet Activity:  No CT head on 11/15/2019 Showed calcified 1.2 x 1.3 cm meningioma over the left frontal convexities.   Cervicalgia: Onset 2021.  It is a dull persistent bilateral pain in back of neck radiating up both occipital regions to top of head.  Pain aggravated turning head to either side or looking up.  She feels a soft "bump" on both sides of posterior head. No paresthesias on back of head.  No radicular pain down the arms.  No preceding neck/head trauma or strenuous activity involving the neck.  She denies prior history of neck pain.  She has been treating with Tylenol and gabapentin.  Subsequently had cervical ACDF in 2023 which was helpful for awhile but headaches and neck pain returned.  Cervical spine X-ray on 10/14/2022 showed C3-C5 ACDF and multilevel degenerative changes at C4-5, C5-6 and C6-7.   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  Neuralgia, probably residual from prior TMJ inflammation  Memory deficits: Reports increased memory problems in 2024, gradual.  Mostly she has some word-finding difficulties.  Stops conversation for a couple of seconds to recall a word.  May repeat questions.  Sometimes may forget why she walked into a room.  She has forgotten the words to a prayer.  However, she is able to perform all ADLs.  Does not get lost when driving.  Marland Kitchen  Her mother had Alzheimer's.  TSH and Vit D from February 2024 were 0.55 and 65 respectively     Past NSAIDS:  ibuprofen  Past analgesics:  tramadol Past antihypertensive medications:  clonidine Past antidepressant medications:  none Past anticonvulsant medications:  gabapentin   Family history of headache:  No    MRI-negative Brainstem Stroke: She was admitted to [facility] on 07/29/2021 for  stroke-like symptoms.  She presented for binocular vertical diplopia.  No speech disturbance, facial droop, vertigo, numbness or weakness.  IV t-PA not given as she was outside the window.  CT head showed stable left frontal meningioma but no acute stroke.  CTA of head and neck negative for LVO or hemodynamically significant stenosis.  MRI of brain was negative for acute stroke but DWI-negative brainstem infarct was suspected.  LDL was 95 and Hgb A1c was 5.6.  Echocardiogram performed in August showed EF 40-45%.  Was already on Coumadin for MVR with appendage clipping in June.  She was continued on Coumadin and ASA 81mg  daily.  Lipitor was increased from 20mg  to 40mg  daily.  Still with vertical diplopia.  Cervical X-ray showed degenerative changes, also noted on CTA of neck.    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   Dr Augustine Radar, Sande Rives, Dr Everlena Cooper     Abnormal CT scan of lung 02/06/2021   Acute bursitis of right shoulder 07/22/2022   Injection given July 22, 2022     Acute post-traumatic headache, not intractable 11/15/2019   Allergic rhinitis    Atypical chest pain    Bilateral carpal tunnel syndrome 07/17/2017   EMG, Levert Feinstein, M.D  06/2017     Bilateral primary osteoarthritis of knee 10/03/2013   Bilateral injections given May 09, 2020     Bilateral shoulder pain 12/11/2017   Binocular vision disorder with diplopia 07/29/2021   Carbuncle of groin 02/25/2019   Cardiomegaly 02/05/2021   Cervicalgia 10/28/2022   Cervicogenic headache 06/03/2018   Chest pain 02/05/2021   Chronic midline low back pain without sciatica 09/15/2016   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   Degenerative spondylolisthesis 05/06/2022   Diverticulosis    Dupuytren's contracture of hand 07/20/2017   Epigastric pain 12/26/2010   Essential hypertension 09/27/2010   Gastritis 11/08/2007, 04/12/2012   H pylori bx neg 03/2012   Generalized  osteoarthritis of multiple sites 06/30/2017   GERD (gastroesophageal reflux disease) 11/08/2007   Heart failure 02/21/2021   Hepatic cyst    Hiatal hernia    11/08/2007, 04/12/2012   Hyperlipidemia    IBS (irritable bowel syndrome)    diarrhea predom   Joint pain 06/03/2016   Lateral meniscal tear 10/03/2013   Degenerative seen on ultrasound October 03, 2013     Left lower quadrant abdominal pain 05/01/2020   Leg cramps 06/10/2017   Long term (current) use of anticoagulants 05/29/2021   Migraine headache 09/27/2010   Mild cognitive impairment 08/18/2023   Mitral regurgitation    Murmur 02/21/2021   Osteopenia 07/20/2016   dexa 01/2019: Spine -2.2, RFN -2.2, LFN -2.0, decrease since prior scan.. frax -12.5%, 3%-we will look into Prolia-if not covered then Reclast  DEXA from [place]-osteoporosis  Prior dexa scans have showed osteoporosis     Osteoporosis    DEXA 01/21/2012: -2.1 spine and R fem, -1.8 L fem s/p fosamax  2004-2011 DEXA 04/18/14 @ [facility]: -2.5 - rec to start Prolia     Paroxysmal atrial fibrillation 06/19/2021   Patellofemoral pain syndrome 10/03/2013   Personal history of urinary calculi 09/27/2010   Prediabetes 05/02/2019   PVC's (premature ventricular contractions)    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   Complex valvuloplasty including PTFE neochord placement x6  with 30 mm Medtronic Simuform ring annuloplasty with clipping of LA appendage   Schatzki's ring 11/11/2010   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   Shingles outbreak 04/2013   Skin tag 10/28/2022   Spinal stenosis, lumbar region, 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  Repeat injection given January 22, 2023     Trigger thumb of right hand 01/22/2023   Injection given January 22, 2023 repeat at the  A1 pulley Apr 02, 2023     Varicose vein of leg     MEDICATIONS: Current Outpatient Medications on File Prior to Visit  Medication Sig Dispense Refill   acetaminophen (TYLENOL) 650 MG CR tablet Take 650 mg by mouth every 8 (eight) hours as needed for pain (Headache).     amLODipine (NORVASC) 5 MG tablet Take 1 tablet (5 mg total) by mouth daily. 90 tablet 3   amLODipine (NORVASC) 5 MG tablet Take 1 tablet (5 mg total) by mouth daily. 90 tablet 3   aspirin EC 81 MG EC tablet Take 1 tablet (81 mg total) by mouth daily. Swallow whole. 30 tablet 11   atorvastatin (LIPITOR) 40 MG tablet TAKE ONE TABLET BY MOUTH ONCE DAILY 90 tablet 3   atorvastatin (LIPITOR) 40 MG tablet Take 1 tablet (40 mg total) by mouth daily. 90 tablet 3   Cyanocobalamin (VITAMIN B-12) 2500 MCG SUBL Place 2,500 mcg under the tongue daily.     gabapentin (NEURONTIN) 100 MG capsule Take 2 capsules (200 mg total) by mouth at bedtime. 60 capsule 5   gabapentin (NEURONTIN) 100 MG capsule Take 1 capsule (100 mg total) by mouth 2 (two) times daily. 246 capsule 0   metoprolol succinate (TOPROL-XL) 25 MG 24 hr tablet TAKE ONE TABLET BY MOUTH EVERY MORNING & TAKE ONE TABLET BY MOUTH EVERY EVENING 180 tablet 2   metoprolol succinate (TOPROL-XL) 25 MG 24 hr tablet Take 1 tablet (25 mg total) by mouth 2 (two) times daily. 180 tablet 3   Peppermint Oil (IBGARD) 90 MG CPCR Use as needed (Patient taking differently: Take 90 mg by mouth daily as needed (Stomach control). Use as needed)     Polyethyl Glycol-Propyl Glycol (SYSTANE) 0.4-0.3 % SOLN Place 1 drop into both eyes 2 (two) times daily.     warfarin (COUMADIN) 2 MG tablet Take 1-1.5 tablets (2-3 mg total) by mouth daily or as directed by Anticoagulation Clinic. 45 tablet 1   No current facility-administered medications on file prior to visit.    ALLERGIES: No Known Allergies  FAMILY HISTORY: Family History  Problem Relation Age of Onset   Hypertension Mother    Alzheimer's  disease Mother 48   AAA (abdominal aortic aneurysm) Mother    Stroke Father 18   Kidney cancer Brother        mets   Bone cancer Brother 14   Colon cancer Neg Hx       Objective:  Blood pressure 122/70, pulse 75, height 5\' 1"  (1.549 m), weight 136 lb 9.6 oz (62 kg), SpO2 97%. General: No acute distress.  Patient appears well-groomed.   Head:  Normocephalic/atraumatic Eyes:  Fundi examined but not visualized Neck: supple, left paraspinal tenderness, reduced range of motion in all directions Heart:  Regular rate and rhythm Neurological Exam: alert and oriented.  Speech fluent and not dysarthric, language  intact.  CN II-XII intact. Bulk and tone normal, muscle strength 5/5 throughout.  Sensation to light touch intact.  Deep tendon reflexes 2+ throughout.  Finger to nose testing intact.  Gait normal, Romberg negative.   Shon Millet, DO  CC: Betty [place], MD

## 2023-12-21 ENCOUNTER — Encounter: Payer: Self-pay | Admitting: Neurology

## 2023-12-21 ENCOUNTER — Other Ambulatory Visit (HOSPITAL_COMMUNITY): Payer: Self-pay

## 2023-12-21 ENCOUNTER — Ambulatory Visit (INDEPENDENT_AMBULATORY_CARE_PROVIDER_SITE_OTHER): Payer: Medicare HMO | Admitting: Neurology

## 2023-12-21 ENCOUNTER — Other Ambulatory Visit: Payer: Self-pay

## 2023-12-21 VITALS — BP 122/70 | HR 75 | Ht 61.0 in | Wt 136.6 lb

## 2023-12-21 DIAGNOSIS — G4486 Cervicogenic headache: Secondary | ICD-10-CM

## 2023-12-21 DIAGNOSIS — M5481 Occipital neuralgia: Secondary | ICD-10-CM | POA: Diagnosis not present

## 2023-12-21 DIAGNOSIS — G3184 Mild cognitive impairment, so stated: Secondary | ICD-10-CM

## 2023-12-21 MED ORDER — GABAPENTIN 100 MG PO CAPS
ORAL_CAPSULE | ORAL | 5 refills | Status: DC
  Filled 2023-12-21: qty 90, fill #0
  Filled 2024-03-07 (×2): qty 90, 30d supply, fill #0
  Filled 2024-04-19: qty 90, 30d supply, fill #1
  Filled 2024-05-18: qty 90, 30d supply, fill #2
  Filled 2024-06-10: qty 90, 30d supply, fill #3
  Filled 2024-07-19: qty 90, 30d supply, fill #4
  Filled 2024-08-16: qty 90, 30d supply, fill #5

## 2023-12-21 NOTE — Patient Instructions (Signed)
Increase gabapentin to 1 pill in morning and 2 pills at bedtime Repeat neuropsychological evaluation in one year with Dr. Milbert Coulter Follow up 6 months.

## 2023-12-26 ENCOUNTER — Other Ambulatory Visit: Payer: Self-pay | Admitting: Cardiovascular Disease

## 2023-12-26 DIAGNOSIS — Z9889 Other specified postprocedural states: Secondary | ICD-10-CM

## 2023-12-28 ENCOUNTER — Ambulatory Visit: Payer: Medicare HMO | Attending: Cardiovascular Disease

## 2023-12-28 ENCOUNTER — Other Ambulatory Visit (HOSPITAL_COMMUNITY): Payer: Self-pay

## 2023-12-28 DIAGNOSIS — Z7901 Long term (current) use of anticoagulants: Secondary | ICD-10-CM | POA: Diagnosis not present

## 2023-12-28 DIAGNOSIS — Z9889 Other specified postprocedural states: Secondary | ICD-10-CM | POA: Diagnosis not present

## 2023-12-28 DIAGNOSIS — I340 Nonrheumatic mitral (valve) insufficiency: Secondary | ICD-10-CM

## 2023-12-28 LAB — POCT INR: INR: 2.8 (ref 2.0–3.0)

## 2023-12-28 MED ORDER — WARFARIN SODIUM 2 MG PO TABS
2.0000 mg | ORAL_TABLET | Freq: Every day | ORAL | 1 refills | Status: DC
Start: 2023-12-28 — End: 2024-02-08
  Filled 2024-01-06: qty 45, 30d supply, fill #0

## 2023-12-28 NOTE — Patient Instructions (Signed)
Continue taking warfarin 1.5 tablets daily except for 1 tablet on Wednesday. Recheck INR in 6 weeks. Coumadin Clinic [phone]; Procedure Fax Number [phone] or [phone] Lumbar Injection TBD will need Lovenox Bridge.

## 2024-01-06 ENCOUNTER — Other Ambulatory Visit (HOSPITAL_COMMUNITY): Payer: Self-pay

## 2024-01-18 ENCOUNTER — Institutional Professional Consult (permissible substitution): Payer: Medicare HMO | Admitting: Psychology

## 2024-01-18 ENCOUNTER — Ambulatory Visit: Payer: Self-pay

## 2024-01-25 ENCOUNTER — Encounter: Payer: Medicare HMO | Admitting: Psychology

## 2024-02-08 ENCOUNTER — Ambulatory Visit: Payer: Medicare HMO | Attending: Cardiovascular Disease | Admitting: *Deleted

## 2024-02-08 ENCOUNTER — Other Ambulatory Visit (HOSPITAL_COMMUNITY): Payer: Self-pay

## 2024-02-08 ENCOUNTER — Other Ambulatory Visit: Payer: Self-pay

## 2024-02-08 DIAGNOSIS — I340 Nonrheumatic mitral (valve) insufficiency: Secondary | ICD-10-CM | POA: Diagnosis not present

## 2024-02-08 DIAGNOSIS — Z9889 Other specified postprocedural states: Secondary | ICD-10-CM

## 2024-02-08 DIAGNOSIS — Z7901 Long term (current) use of anticoagulants: Secondary | ICD-10-CM

## 2024-02-08 LAB — POCT INR: INR: 2.6 (ref 2.0–3.0)

## 2024-02-08 MED ORDER — WARFARIN SODIUM 2 MG PO TABS
2.0000 mg | ORAL_TABLET | Freq: Every day | ORAL | 1 refills | Status: DC
Start: 2024-02-08 — End: 2024-06-27
  Filled 2024-02-08: qty 120, 90d supply, fill #0
  Filled 2024-02-09: qty 120, 80d supply, fill #0
  Filled 2024-04-19 (×2): qty 120, 80d supply, fill #1

## 2024-02-08 NOTE — Patient Instructions (Signed)
 Description   Continue taking warfarin 1.5 tablets daily except for 1 tablet on Wednesday. Recheck INR in 6 weeks. [facility] [phone]; Procedure Fax Number [phone] or [phone] Lumbar Injection TBD will need Lovenox Bridge.

## 2024-02-09 ENCOUNTER — Other Ambulatory Visit (HOSPITAL_COMMUNITY): Payer: Self-pay

## 2024-02-10 DIAGNOSIS — H26493 Other secondary cataract, bilateral: Secondary | ICD-10-CM | POA: Diagnosis not present

## 2024-02-10 DIAGNOSIS — Z961 Presence of intraocular lens: Secondary | ICD-10-CM | POA: Diagnosis not present

## 2024-02-10 DIAGNOSIS — H353131 Nonexudative age-related macular degeneration, bilateral, early dry stage: Secondary | ICD-10-CM | POA: Diagnosis not present

## 2024-02-10 DIAGNOSIS — H40013 Open angle with borderline findings, low risk, bilateral: Secondary | ICD-10-CM | POA: Diagnosis not present

## 2024-02-12 ENCOUNTER — Other Ambulatory Visit (HOSPITAL_COMMUNITY): Payer: Self-pay

## 2024-02-12 ENCOUNTER — Ambulatory Visit
Admission: EM | Admit: 2024-02-12 | Discharge: 2024-02-12 | Disposition: A | Discharge status: Home / Self Care | Attending: Family Medicine | Admitting: Family Medicine

## 2024-02-12 DIAGNOSIS — B349 Viral infection, unspecified: Secondary | ICD-10-CM

## 2024-02-12 LAB — POC COVID19/FLU A&B COMBO
Covid Antigen, POC: NEGATIVE
Influenza A Antigen, POC: NEGATIVE
Influenza B Antigen, POC: NEGATIVE

## 2024-02-12 MED ORDER — BENZONATATE 100 MG PO CAPS
100.0000 mg | ORAL_CAPSULE | Freq: Three times a day (TID) | ORAL | 0 refills | Status: DC
  Filled 2024-02-12 (×2): qty 21, 7d supply, fill #0

## 2024-02-12 NOTE — Discharge Instructions (Signed)
 You may take Tessalon 3 times a day as needed for your cough.  Lots of rest and fluids.  Please follow-up with your PCP in 2 to 3 days for recheck.  Please go to ER any worsening symptoms.  Hope you feel better soon exclamation

## 2024-02-12 NOTE — ED Triage Notes (Addendum)
 Pt present with a cough and runny nose x 3 days. Denies fever, SOB. States she has had occasional headaches. States she was vomiting last night and noticed the mucus she coughed up was green and yellow  Home interventions: Robitussin, Tylenol

## 2024-02-12 NOTE — ED Provider Notes (Signed)
 [facility]    CSN: [id number] Arrival date & time: 02/12/24  1406      History   Chief Complaint Chief Complaint  Patient presents with   Cough   Nasal Congestion    HPI [name] [name] is a 78 y.o. female  presents for evaluation of URI symptoms for 3 days. Patient reports associated symptoms of cough, congestion, runny nose occasional headache, sore throat. Denies N/V/D, fever, ear pain, body aches, shortness of breath. Patient does not have a hx of asthma. Patient is not an active smoker.   Reports no sick contacts.  Pt has taken Robitussin and Tylenol OTC for symptoms. Pt has no other concerns at this time.    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   Dr Augustine Radar, Sande Rives, Dr Everlena Cooper     Abnormal CT scan of lung 02/06/2021   Acute bursitis of right shoulder 07/22/2022   Injection given July 22, 2022     Acute post-traumatic headache, not intractable 11/15/2019   Allergic rhinitis    Atypical chest pain    Bilateral carpal tunnel syndrome 07/17/2017   EMG, Levert Feinstein, M.D  06/2017     Bilateral primary osteoarthritis of knee 10/03/2013   Bilateral injections given May 09, 2020     Bilateral shoulder pain 12/11/2017   Binocular vision disorder with diplopia 07/29/2021   Carbuncle of groin 02/25/2019   Cardiomegaly 02/05/2021   Cervicalgia 10/28/2022   Cervicogenic headache 06/03/2018   Chest pain 02/05/2021   Chronic midline low back pain without sciatica 09/15/2016   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   Degenerative spondylolisthesis 05/06/2022   Diverticulosis    Dupuytren's contracture of hand 07/20/2017   Epigastric pain 12/26/2010   Essential hypertension 09/27/2010   Gastritis 11/08/2007, 04/12/2012   H pylori bx neg 03/2012   Generalized osteoarthritis of multiple sites 06/30/2017   GERD (gastroesophageal reflux disease)  11/08/2007   Heart failure 02/21/2021   Hepatic cyst    Hiatal hernia    11/08/2007, 04/12/2012   Hyperlipidemia    IBS (irritable bowel syndrome)    diarrhea predom   Joint pain 06/03/2016   Lateral meniscal tear 10/03/2013   Degenerative seen on ultrasound October 03, 2013     Left lower quadrant abdominal pain 05/01/2020   Leg cramps 06/10/2017   Long term (current) use of anticoagulants 05/29/2021   Migraine headache 09/27/2010   Mild cognitive impairment 08/18/2023   Mitral regurgitation    Murmur 02/21/2021   Osteopenia 07/20/2016   dexa 01/2019: Spine -2.2, RFN -2.2, LFN -2.0, decrease since prior scan.. frax -12.5%, 3%-we will look into Prolia-if not covered then Reclast  DEXA from [place]-osteoporosis  Prior dexa scans have showed osteoporosis     Osteoporosis    DEXA 01/21/2012: -2.1 spine and R fem, -1.8 L fem s/p fosamax  2004-2011 DEXA 04/18/14 @ [facility]: -2.5 - rec to start Prolia     Paroxysmal atrial fibrillation 06/19/2021   Patellofemoral pain syndrome 10/03/2013   Personal history of urinary calculi 09/27/2010   Prediabetes 05/02/2019   PVC's (premature ventricular contractions)    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   Complex valvuloplasty including PTFE neochord placement x6 with 30 mm Medtronic Simuform ring annuloplasty with clipping of LA appendage   Schatzki's ring 11/11/2010   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   Shingles outbreak 04/2013   Skin tag  10/28/2022   Spinal stenosis, lumbar region, 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   Binocular vision disorder with diplopia 07/29/2021   Paroxysmal atrial fibrillation 06/19/2021   Long term (current) use of anticoagulants 05/29/2021   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   Mitral regurgitation 02/28/2021   Heart failure 02/21/2021   Murmur 02/21/2021   Abnormal CT scan of lung 02/06/2021   Chest pain 02/05/2021   Cardiomegaly 02/05/2021   Acute post-traumatic headache, not intractable 11/15/2019   Prediabetes 05/02/2019   Carbuncle of groin 02/25/2019   Cervicogenic headache 06/03/2018   Bilateral shoulder pain 12/11/2017   Gastritis 12/11/2017   Trigger finger, right little finger 09/11/2017   Dupuytren's contracture of hand 07/20/2017   Bilateral carpal tunnel syndrome 07/17/2017   Generalized osteoarthritis of multiple sites 06/30/2017   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   Chronic midline low back pain without sciatica 09/15/2016   Osteopenia 07/20/2016   Hyperlipidemia 02/09/2015   Bilateral primary osteoarthritis of knee 10/03/2013   Patellofemoral pain syndrome 10/03/2013   Varicose vein of leg    Allergic rhinitis    IBS (irritable bowel syndrome) 04/07/2011   Migraine headache 09/27/2010   Essential hypertension 09/27/2010   GERD (gastroesophageal reflux disease) 09/27/2010   Personal history of urinary calculi 09/27/2010    Past Surgical History:  Procedure Laterality Date   ANTERIOR CERVICAL DECOMP/DISCECTOMY FUSION N/A 05/06/2022   Procedure: CERVICAL THREE-FOUR, CERVICAL FOUR-FIVE ANTERIOR CERVICAL  DECOMPRESSION/DISCECTOMY FUSION;  Surgeon: Julio Sicks, MD;  Location: [facility] OR;  Service: Neurosurgery;  Laterality: N/A;   APPENDECTOMY  1958   BUBBLE STUDY  03/14/2021   Procedure: BUBBLE STUDY;  Surgeon: Quintella Reichert, MD;  Location: [facility] ENDOSCOPY;  Service: Cardiovascular;;   CARDIAC CATHETERIZATION     CLIPPING OF ATRIAL APPENDAGE N/A 05/21/2021   Procedure: CLIPPING OF ATRIAL APPENDAGE USING ATRICURE PRO2 CLIP;  Surgeon: Purcell Nails, MD;  Location: [facility] OR;  Service: Open Heart Surgery;  Laterality: N/A;   COLONOSCOPY     CYSTOCELE REPAIR  2008   ESOPHAGOGASTRODUODENOSCOPY     Maxilofacial  1992   MITRAL VALVE REPAIR  05/21/2021   Procedure: MINIMALLY INVASIVE MITRAL VALVE REPAIR (MVR) USING MEDTRONIC SIMUFORM RING;  Surgeon: Purcell Nails, MD;  Location: [facility] OR;  Service: Open Heart Surgery;;   RIGHT/LEFT HEART CATH AND CORONARY ANGIOGRAPHY N/A 03/14/2021   Procedure: RIGHT/LEFT HEART CATH AND CORONARY ANGIOGRAPHY;  Surgeon: Runell Gess, MD;  Location: [facility] INVASIVE CV LAB;  Service: Cardiovascular;  Laterality: N/A;   TEE WITHOUT CARDIOVERSION N/A 03/14/2021   Procedure: TRANSESOPHAGEAL ECHOCARDIOGRAM (TEE);  Surgeon: Armanda Magic  R, MD;  Location: [facility] ENDOSCOPY;  Service: Cardiovascular;  Laterality: N/A;   TEE WITHOUT CARDIOVERSION N/A 05/21/2021   Procedure: TRANSESOPHAGEAL ECHOCARDIOGRAM (TEE);  Surgeon: Purcell Nails, MD;  Location: [facility] OR;  Service: Open Heart Surgery;  Laterality: N/A;   TONSILLECTOMY  1960    OB History   No obstetric history on file.      Home Medications    Prior to Admission medications   Medication Sig Start Date End Date Taking? Authorizing Provider  benzonatate (TESSALON) 100 MG capsule Take 1 capsule (100 mg total) by mouth every 8 (eight) hours. 02/12/24  Yes Radford Pax, NP  acetaminophen (TYLENOL) 650 MG CR tablet Take 650 mg by mouth every 8 (eight) hours as needed for pain (Headache).    [provider]   amLODipine (NORVASC) 5 MG tablet Take 1 tablet (5 mg total) by mouth daily. 05/26/23   Runell Gess, MD  amLODipine (NORVASC) 5 MG tablet Take 1 tablet (5 mg total) by mouth daily. 12/03/23   Runell Gess, MD  aspirin EC 81 MG EC tablet Take 1 tablet (81 mg total) by mouth daily. Swallow whole. 05/26/21   Barrett, Erin R, PA-C  atorvastatin (LIPITOR) 40 MG tablet TAKE ONE TABLET BY MOUTH ONCE DAILY 10/13/22   Runell Gess, MD  atorvastatin (LIPITOR) 40 MG tablet Take 1 tablet (40 mg total) by mouth daily. 12/03/23   Runell Gess, MD  gabapentin (NEURONTIN) 100 MG capsule Take 1 capsule (100 mg total) by mouth in the morning AND 2 capsules (200 mg total) every evening. 12/21/23   Everlena Cooper, Adam R, DO  metoprolol succinate (TOPROL-XL) 25 MG 24 hr tablet TAKE ONE TABLET BY MOUTH EVERY MORNING & TAKE ONE TABLET BY MOUTH EVERY EVENING 05/25/23   Runell Gess, MD  metoprolol succinate (TOPROL-XL) 25 MG 24 hr tablet Take 1 tablet (25 mg total) by mouth 2 (two) times daily. 12/03/23   Runell Gess, MD  Peppermint Oil (IBGARD) 90 MG CPCR Use as needed Patient not taking: Reported on 12/21/2023 10/30/21   Armbruster, Willaim Rayas, MD  Polyethyl Glycol-Propyl Glycol (SYSTANE) 0.4-0.3 % SOLN Place 1 drop into both eyes 2 (two) times daily.    [provider]  warfarin (COUMADIN) 2 MG tablet Take 1-1.5 tablets (2-3 mg total) by mouth daily or as directed by Anticoagulation Clinic. 02/08/24   Runell Gess, MD    Family History Family History  Problem Relation Age of Onset   Hypertension Mother    Alzheimer's disease Mother 49   AAA (abdominal aortic aneurysm) Mother    Stroke Father 44   Kidney cancer Brother        mets   Bone cancer Brother 77   Colon cancer Neg Hx     Social History Social History   Tobacco Use   Smoking status: Never   Smokeless tobacco: Never  Vaping Use   Vaping status: Never Used  Substance Use Topics   Alcohol use: No   Drug use: No      Allergies   Patient has no known allergies.   Review of Systems Review of Systems  HENT:  Positive for congestion and sore throat.   Respiratory:  Positive for cough.   Neurological:  Positive for headaches.     Physical Exam Triage Vital Signs ED Triage Vitals  Encounter Vitals Group     BP 02/12/24 1457 (!) 142/80     Systolic BP Percentile --  Diastolic BP Percentile --      Pulse Rate 02/12/24 1457 85     Resp 02/12/24 1457 16     Temp 02/12/24 1457 98.1 F (36.7 C)     Temp Source 02/12/24 1457 Oral     SpO2 02/12/24 1457 95 %     Weight --      Height --      Head Circumference --      Peak Flow --      Pain Score 02/12/24 1456 5     Pain Loc --      Pain Education --      Exclude from Growth Chart --    No data found.  Updated Vital Signs BP (!) 142/80 (BP Location: Left Arm)   Pulse 85   Temp 98.1 F (36.7 C) (Oral)   Resp 16   SpO2 95%   Visual Acuity Right Eye Distance:   Left Eye Distance:   Bilateral Distance:    Right Eye Near:   Left Eye Near:    Bilateral Near:     Physical Exam Vitals and nursing note reviewed.  Constitutional:      General: She is not in acute distress.    Appearance: She is well-developed. She is not ill-appearing.  HENT:     Head: Normocephalic and atraumatic.     Right Ear: Tympanic membrane and ear canal normal.     Left Ear: Tympanic membrane and ear canal normal.     Nose: Congestion present.     Mouth/Throat:     Mouth: Mucous membranes are moist.     Pharynx: Oropharynx is clear. Uvula midline. Posterior oropharyngeal erythema present.     Tonsils: No tonsillar exudate or tonsillar abscesses.  Eyes:     Conjunctiva/sclera: Conjunctivae normal.     Pupils: Pupils are equal, round, and reactive to light.  Cardiovascular:     Rate and Rhythm: Normal rate and regular rhythm.     Heart sounds: Normal heart sounds.  Pulmonary:     Effort: Pulmonary effort is normal.     Breath sounds: Normal  breath sounds.  Musculoskeletal:     Cervical back: Normal range of motion and neck supple.  Lymphadenopathy:     Cervical: No cervical adenopathy.  Skin:    General: Skin is warm and dry.  Neurological:     General: No focal deficit present.     Mental Status: She is alert and oriented to person, place, and time.  Psychiatric:        Mood and Affect: Mood normal.        Behavior: Behavior normal.      UC Treatments / Results  Labs (all labs ordered are listed, but only abnormal results are displayed) Labs Reviewed  POC COVID19/FLU A&B COMBO    EKG   Radiology No results found.  Procedures Procedures (including critical care time)  Medications Ordered in UC Medications - No data to display  Initial Impression / Assessment and Plan / UC Course  I have reviewed the triage vital signs and the nursing notes.  Pertinent labs & imaging results that were available during my care of the patient were reviewed by me and considered in my medical decision making (see chart for details).     I reviewed exam and symptoms with patient.  No red flags.  Negative rapid flu and COVID testing.  Discussed viral illness and symptomatic treatment.  Tessalon as needed cough.  Discussed rest fluids.  PCP follow-up if symptoms do not improve.  ER precautions reviewed and patient verbalized understanding. Final Clinical Impressions(s) / UC Diagnoses   Final diagnoses:  Viral illness     Discharge Instructions      You may take Tessalon 3 times a day as needed for your cough.  Lots of rest and fluids.  Please follow-up with your PCP in 2 to 3 days for recheck.  Please go to ER any worsening symptoms.  Hope you feel better soon exclamation     ED Prescriptions     Medication Sig Dispense Auth. Provider   benzonatate (TESSALON) 100 MG capsule Take 1 capsule (100 mg total) by mouth every 8 (eight) hours. 21 capsule Radford Pax, NP      PDMP not reviewed this encounter.   Radford Pax, NP 02/12/24 [phone]

## 2024-02-26 ENCOUNTER — Telehealth: Payer: Self-pay

## 2024-02-26 NOTE — Telephone Encounter (Addendum)
 Pt ready for scheduling on or after 4/15  Estimated out-of-pocket cost due at time of visit: $315  Primary Insurance: [employer] Prolia co-insurance: 20% (approximately $315)  Prior Auth: Approved Valid: 03/03/24 to 03/03/25   This summary of benefits is an estimation of the patient's out-of-pocket cost. Exact cost may vary based on individual plan coverage.

## 2024-02-29 ENCOUNTER — Telehealth: Payer: Self-pay

## 2024-02-29 ENCOUNTER — Other Ambulatory Visit (HOSPITAL_COMMUNITY): Payer: Self-pay

## 2024-02-29 NOTE — Telephone Encounter (Signed)
 Pharmacy Patient Advocate Encounter  Received notification from [employer] that Prior Authorization for PROLIA has been APPROVED from 03/03/24 to 03/03/25. Ran test claim, Copay is $645.79. This test claim was processed through [facility]- copay amounts may vary at other pharmacies due to pharmacy/plan contracts, or as the patient moves through the different stages of their insurance plan.   PA #/Case ID/Reference #: [id number]

## 2024-03-01 ENCOUNTER — Ambulatory Visit: Admitting: Family Medicine

## 2024-03-02 ENCOUNTER — Encounter: Payer: Self-pay | Admitting: Family Medicine

## 2024-03-02 ENCOUNTER — Other Ambulatory Visit: Payer: Self-pay

## 2024-03-02 ENCOUNTER — Other Ambulatory Visit (HOSPITAL_COMMUNITY): Payer: Self-pay

## 2024-03-02 ENCOUNTER — Ambulatory Visit (INDEPENDENT_AMBULATORY_CARE_PROVIDER_SITE_OTHER): Admitting: Family Medicine

## 2024-03-02 VITALS — BP 130/82 | HR 91 | Temp 97.8°F | Resp 16 | Ht 61.0 in | Wt 136.4 lb

## 2024-03-02 DIAGNOSIS — R7303 Prediabetes: Secondary | ICD-10-CM

## 2024-03-02 DIAGNOSIS — E782 Mixed hyperlipidemia: Secondary | ICD-10-CM

## 2024-03-02 DIAGNOSIS — I5020 Unspecified systolic (congestive) heart failure: Secondary | ICD-10-CM | POA: Diagnosis not present

## 2024-03-02 DIAGNOSIS — I480 Paroxysmal atrial fibrillation: Secondary | ICD-10-CM | POA: Diagnosis not present

## 2024-03-02 DIAGNOSIS — M816 Localized osteoporosis [Lequesne]: Secondary | ICD-10-CM | POA: Diagnosis not present

## 2024-03-02 LAB — POCT GLYCOSYLATED HEMOGLOBIN (HGB A1C): Hemoglobin A1C: 5.6 % (ref 4.0–5.6)

## 2024-03-02 MED ORDER — ATORVASTATIN CALCIUM 20 MG PO TABS
20.0000 mg | ORAL_TABLET | Freq: Every day | ORAL | 1 refills | Status: DC
  Filled 2024-03-02: qty 90, 90d supply, fill #0
  Filled 2024-05-30: qty 90, 90d supply, fill #1

## 2024-03-02 NOTE — Progress Notes (Signed)
 HPI: [name] [name] is a 78 y.o. female with a PMHx significant for HTN, paroxysmal atrial fibrillation, varicose veins, GERD, IBS, OA, HLD, and prediabetes, among others, who is here today with her husband for chronic disease management.  Last seen on 10/27/2023  Prediabetes:  Lab Results  Component Value Date   HGBA1C 5.8 (H) 12/02/2023   Hyperlipidemia: Currently on atorvastatin 40 mg daily.  She tries to avoid fat in her diet.  Side effects from medication: none She sees her cardiologist once per year.   She would like to stop medication, states that tab is too big. Hx of occipital stroke in 2023, no residual deficit.  Lab Results  Component Value Date   CHOL 177 12/02/2023   HDL 63 12/02/2023   LDLCALC 100 (H) 12/02/2023   LDLDIRECT 75.0 11/02/2019   TRIG 72 12/02/2023   CHOLHDL 2.8 12/02/2023   Osteoporosis:  Patient is currently on Prolia but says that price went up, so inquiring about other options. She took Fosamax  before. She has been taking it since 2020.  Not currently taking vitamin D.  Lab Results  Component Value Date   VD25OH 65.10 01/22/2023   Severe MR s/p mitral valve repair and PAF on chronic anticoagulation, coumadin. She follows with the coumadin clinic every 8 weeks.  HTN on Amlodipine 5 mg daily and Metoprolol Succinate 25 mg daily. HFrEF: Echo 06/2023 LVEF 45-50%  Review of Systems  Constitutional:  Negative for activity change, appetite change and fever.  HENT:  Negative for sore throat.   Respiratory:  Negative for cough and wheezing.   Gastrointestinal:  Negative for abdominal pain, nausea and vomiting.  Endocrine: Negative for cold intolerance and heat intolerance.  Genitourinary:  Negative for decreased urine volume, dysuria and hematuria.  Skin:  Negative for rash.  Neurological:  Negative for syncope and facial asymmetry.  See other pertinent positives and negatives in HPI.  Current Outpatient Medications on File Prior  to Visit  Medication Sig Dispense Refill   acetaminophen (TYLENOL) 650 MG CR tablet Take 650 mg by mouth every 8 (eight) hours as needed for pain (Headache).     amLODipine (NORVASC) 5 MG tablet Take 1 tablet (5 mg total) by mouth daily. 90 tablet 3   aspirin EC 81 MG EC tablet Take 1 tablet (81 mg total) by mouth daily. Swallow whole. 30 tablet 11   benzonatate (TESSALON) 100 MG capsule Take 1 capsule (100 mg total) by mouth every 8 (eight) hours. 21 capsule 0   gabapentin (NEURONTIN) 100 MG capsule Take 1 capsule (100 mg total) by mouth in the morning AND 2 capsules (200 mg total) every evening. 90 capsule 5   metoprolol succinate (TOPROL-XL) 25 MG 24 hr tablet TAKE ONE TABLET BY MOUTH EVERY MORNING & TAKE ONE TABLET BY MOUTH EVERY EVENING 180 tablet 2   metoprolol succinate (TOPROL-XL) 25 MG 24 hr tablet Take 1 tablet (25 mg total) by mouth 2 (two) times daily. 180 tablet 3   Peppermint Oil (IBGARD) 90 MG CPCR Use as needed     Polyethyl Glycol-Propyl Glycol (SYSTANE) 0.4-0.3 % SOLN Place 1 drop into both eyes 2 (two) times daily.     warfarin (COUMADIN) 2 MG tablet Take 1-1.5 tablets (2-3 mg total) by mouth daily or as directed by Anticoagulation Clinic. 120 tablet 1   No current facility-administered medications on file prior to visit.    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  Dr Augustine Radar, Sande Rives, Dr Everlena Cooper     Abnormal CT scan of lung 02/06/2021   Acute bursitis of right shoulder 07/22/2022   Injection given July 22, 2022     Acute post-traumatic headache, not intractable 11/15/2019   Allergic rhinitis    Atypical chest pain    Bilateral carpal tunnel syndrome 07/17/2017   EMG, Levert Feinstein, M.D  06/2017     Bilateral primary osteoarthritis of knee 10/03/2013   Bilateral injections given May 09, 2020     Bilateral shoulder pain 12/11/2017   Binocular vision disorder with diplopia 07/29/2021   Carbuncle of groin 02/25/2019   Cardiomegaly  02/05/2021   Cervicalgia 10/28/2022   Cervicogenic headache 06/03/2018   Chest pain 02/05/2021   Chronic midline low back pain without sciatica 09/15/2016   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   Degenerative spondylolisthesis 05/06/2022   Diverticulosis    Dupuytren's contracture of hand 07/20/2017   Epigastric pain 12/26/2010   Essential hypertension 09/27/2010   Gastritis 11/08/2007, 04/12/2012   H pylori bx neg 03/2012   Generalized osteoarthritis of multiple sites 06/30/2017   GERD (gastroesophageal reflux disease) 11/08/2007   Heart failure 02/21/2021   Hepatic cyst    Hiatal hernia    11/08/2007, 04/12/2012   Hyperlipidemia    IBS (irritable bowel syndrome)    diarrhea predom   Joint pain 06/03/2016   Lateral meniscal tear 10/03/2013   Degenerative seen on ultrasound October 03, 2013     Left lower quadrant abdominal pain 05/01/2020   Leg cramps 06/10/2017   Long term (current) use of anticoagulants 05/29/2021   Migraine headache 09/27/2010   Mild cognitive impairment 08/18/2023   Mitral regurgitation    Murmur 02/21/2021   Osteopenia 07/20/2016   dexa 01/2019: Spine -2.2, RFN -2.2, LFN -2.0, decrease since prior scan.. frax -12.5%, 3%-we will look into Prolia-if not covered then Reclast  DEXA from [place]-osteoporosis  Prior dexa scans have showed osteoporosis     Osteoporosis    DEXA 01/21/2012: -2.1 spine and R fem, -1.8 L fem s/p fosamax  2004-2011 DEXA 04/18/14 @ [facility]: -2.5 - rec to start Prolia     Paroxysmal atrial fibrillation 06/19/2021   Patellofemoral pain syndrome 10/03/2013   Personal history of urinary calculi 09/27/2010   Prediabetes 05/02/2019   PVC's (premature ventricular contractions)    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   Complex valvuloplasty including PTFE neochord placement x6 with 30 mm Medtronic Simuform ring annuloplasty with clipping of LA appendage   Schatzki's ring 11/11/2010   Esophageal stricture dilation  10/2010   Shingles outbreak 04/2013   Skin tag 10/28/2022   Spinal stenosis, lumbar region, 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  Repeat injection given January 22, 2023     Trigger thumb of right hand 01/22/2023   Injection given January 22, 2023 repeat at the A1 pulley Apr 02, 2023     Varicose vein of leg    No Known Allergies  Social History   Socioeconomic History   Marital status: Married    Spouse name: Madaline Guthrie   Number of children: 3   Years of education: 18   Highest education level: Master's degree (e.g., MA, MS, MEng, MEd, MSW, MBA)  Occupational History   Occupation: Retired  Tobacco Use   Smoking  status: Never   Smokeless tobacco: Never  Vaping Use   Vaping status: Never Used  Substance and Sexual Activity   Alcohol use: No   Drug use: No   Sexual activity: Not on file  Other Topics Concern   Not on file  Social History Narrative   Married, lives with spouse. Retired Comptroller, Manufacturing systems engineer. Has 3 kids (moved to [place] to be closer)- youngest son MD. Linton Ham to [place] from [place] 06/2010, lived in [place] x 10years   Social Drivers of Health   Financial Resource Strain: Low Risk  (05/15/2023)   Overall Financial Resource Strain (CARDIA)    Difficulty of Paying Living Expenses: Not hard at all  Food Insecurity: No Food Insecurity (05/15/2023)   Hunger Vital Sign    Worried About Running Out of Food in the Last Year: Never true    Ran Out of Food in the Last Year: Never true  Transportation Needs: No Transportation Needs (05/15/2023)   PRAPARE - Administrator, Civil Service (Medical): No    Lack of Transportation (Non-Medical): No  Physical Activity: Insufficiently Active (05/15/2023)   Exercise Vital Sign    Days of Exercise per Week: 3 days    Minutes of  Exercise per Session: 30 min  Stress: No Stress Concern Present (05/15/2023)   [employer] of Occupational Health - Occupational Stress Questionnaire    Feeling of Stress : Not at all  Social Connections: Socially Integrated (05/15/2023)   Social Connection and Isolation Panel [NHANES]    Frequency of Communication with Friends and Family: More than three times a week    Frequency of Social Gatherings with Friends and Family: More than three times a week    Attends Religious Services: More than 4 times per year    Active Member of [employer] or Organizations: Yes    Attends Engineer, structural: More than 4 times per year    Marital Status: Married   Today's Vitals   03/02/24 1358  BP: 130/82  Pulse: 91  Resp: 16  Temp: 97.8 F (36.6 C)  TempSrc: Oral  SpO2: 98%  Weight: 136 lb 6.4 oz (61.9 kg)  Height: 5\' 1"  (1.549 m)   Body mass index is 25.77 kg/m.  Physical Exam Vitals and nursing note reviewed.  Constitutional:      General: She is not in acute distress.    Appearance: She is well-developed.  HENT:     Head: Normocephalic and atraumatic.     Mouth/Throat:     Mouth: Mucous membranes are moist.     Pharynx: Uvula midline.  Eyes:     Conjunctiva/sclera: Conjunctivae normal.  Cardiovascular:     Rate and Rhythm: Normal rate and regular rhythm. Occasional Extrasystoles are present.    Pulses:          Dorsalis pedis pulses are 2+ on the right side and 2+ on the left side.     Heart sounds: No murmur heard.    Comments: Trace bilateral pitting edema Pulmonary:     Effort: Pulmonary effort is normal. No respiratory distress.     Breath sounds: Normal breath sounds.  Abdominal:     Palpations: Abdomen is soft. There is no hepatomegaly or mass.     Tenderness: There is no abdominal tenderness.  Musculoskeletal:     Right lower leg: Edema present.     Left lower leg: Edema present.  Skin:    General: Skin is warm.     Findings:  No erythema or rash.   Neurological:     General: No focal deficit present.     Mental Status: She is alert and oriented to person, place, and time.     Cranial Nerves: No cranial nerve deficit.     Gait: Gait normal.  Psychiatric:        Mood and Affect: Mood and affect normal.    ASSESSMENT AND PLAN:  Ms. Sivertsen was seen today for chronic disease management.   Orders Placed This Encounter  Procedures   Lipid panel   POC HgB A1c   Lab Results  Component Value Date   HGBA1C 5.6 03/02/2024   Prediabetes Assessment & Plan: HgA1C improved, it went from 5.8 to 5.6. Encouraged consistency with a healthy life style for diabetes prevention.  Orders: -     POCT glycosylated hemoglobin (Hb A1C)  Mixed hyperlipidemia Assessment & Plan: Hx of CVA, we discussed stain CV benefits. She agrees with trying lowering dose, smaller tab, Atorvastatin 20 mg daily. Low fat diet also recommended. FLP in 3-4 months.  Orders: -     Atorvastatin Calcium; Take 1 tablet (20 mg total) by mouth daily.  Dispense: 90 tablet; Refill: 1 -     Lipid panel; Future  Localized osteoporosis without current pathological fracture Assessment & Plan: We discussed other pharmacologic options. Upon further insurance review It seems like the cost of Prolia is the same than last year, so she would like to continue it. Continue fall precautions, adequate calcium supplementation, vit D 800 U daily, and regular physical activity as tolerated.   Paroxysmal atrial fibrillation [facility]) Assessment & Plan: Rhythm controlled. Currently on Metoprolol succinate 25 mg daily and coumadin. Follows with cardiologist.   HFrEF (heart failure with reduced ejection fraction) (HCC) Assessment & Plan: Euvolemic. On Metoprolol succinate. HFrEF, last echo 06/2023 LVEF 45-50% and global hypokinesia. Following with cardiologist.    Return in about 4 months (around 07/02/2024) for Labs.  I, Rolla Etienne Wierda, acting as a scribe for Sherilyn Windhorst [place],  MD., have documented all relevant documentation on the behalf of Alem Fahl [place], MD, as directed by  Heer Justiss [place], MD while in the presence of Adileny Delon [place], MD.   I, Kannen Moxey [place], MD, have reviewed all documentation for this visit. The documentation on 03/04/24 for the exam, diagnosis, procedures, and orders are all accurate and complete.  Verlean Allport G. [place], MD  [facility]. [place] office.

## 2024-03-02 NOTE — Patient Instructions (Signed)
 A few things to remember from today's visit:  Mixed hyperlipidemia - Plan: atorvastatin (LIPITOR) 20 MG tablet  Prediabetes Continue Prolia. Vit D 800 U daily. Bajamos la dosis de Atorvastatin to 20 mg, creo que la tab es mas pequna. Colesterol en 3-4 meses, pida una cita para laboratorio.  If you need refills for medications you take chronically, please call your pharmacy. Do not use My Chart to request refills or for acute issues that need immediate attention. If you send a my chart message, it may take a few days to be addressed, specially if I am not in the office.  Please be sure medication list is accurate. If a new problem present, please set up appointment sooner than planned today.

## 2024-03-03 ENCOUNTER — Ambulatory Visit

## 2024-03-04 ENCOUNTER — Encounter: Payer: Self-pay | Admitting: Family Medicine

## 2024-03-04 NOTE — Assessment & Plan Note (Addendum)
 Hx of CVA, we discussed stain CV benefits. She agrees with trying lowering dose, smaller tab, Atorvastatin 20 mg daily. Low fat diet also recommended. FLP in 3-4 months.

## 2024-03-04 NOTE — Assessment & Plan Note (Signed)
 Euvolemic. On Metoprolol succinate. HFrEF, last echo 06/2023 LVEF 45-50% and global hypokinesia. Following with cardiologist.

## 2024-03-04 NOTE — Assessment & Plan Note (Signed)
 Rhythm controlled. Currently on Metoprolol succinate 25 mg daily and coumadin. Follows with cardiologist.

## 2024-03-04 NOTE — Assessment & Plan Note (Signed)
 We discussed other pharmacologic options. Upon further insurance review It seems like the cost of Prolia is the same than last year, so she would like to continue it. Continue fall precautions, adequate calcium supplementation, vit D 800 U daily, and regular physical activity as tolerated.

## 2024-03-04 NOTE — Assessment & Plan Note (Addendum)
 HgA1C improved, it went from 5.8 to 5.6. Encouraged consistency with a healthy life style for diabetes prevention.

## 2024-03-07 ENCOUNTER — Other Ambulatory Visit (HOSPITAL_COMMUNITY): Payer: Self-pay

## 2024-03-08 ENCOUNTER — Ambulatory Visit (INDEPENDENT_AMBULATORY_CARE_PROVIDER_SITE_OTHER)

## 2024-03-08 ENCOUNTER — Ambulatory Visit

## 2024-03-08 DIAGNOSIS — M810 Age-related osteoporosis without current pathological fracture: Secondary | ICD-10-CM | POA: Diagnosis not present

## 2024-03-08 MED ORDER — DENOSUMAB 60 MG/ML ~~LOC~~ SOSY
60.0000 mg | PREFILLED_SYRINGE | Freq: Once | SUBCUTANEOUS | Status: AC
  Administered 2024-03-08: 60 mg via SUBCUTANEOUS

## 2024-03-08 NOTE — Progress Notes (Signed)
 Per orders of Dr. [place], injection of Prolia given by Cosimo Diones. Patient tolerated injection well.   Please sign in the absences of Dr. [place]

## 2024-03-08 NOTE — Patient Instructions (Signed)
 Health Maintenance Due  Topic Date Due   Zoster Vaccines- Shingrix (1 of 2) Never done   DTaP/Tdap/Td (1 - Tdap) 01/15/2012   COVID-19 Vaccine (3 - Pfizer risk series) 03/27/2020       05/15/2023   10:27 AM 12/22/2022   11:36 AM 10/28/2022   10:32 AM  Depression screen PHQ 2/9  Decreased Interest 0 0 0  Down, Depressed, Hopeless 0 0 0  PHQ - 2 Score 0 0 0

## 2024-03-21 ENCOUNTER — Ambulatory Visit: Attending: Cardiovascular Disease

## 2024-03-21 DIAGNOSIS — Z9889 Other specified postprocedural states: Secondary | ICD-10-CM | POA: Diagnosis not present

## 2024-03-21 DIAGNOSIS — Z7901 Long term (current) use of anticoagulants: Secondary | ICD-10-CM

## 2024-03-21 DIAGNOSIS — I340 Nonrheumatic mitral (valve) insufficiency: Secondary | ICD-10-CM

## 2024-03-21 LAB — POCT INR: INR: 2.1 (ref 2.0–3.0)

## 2024-03-21 NOTE — Patient Instructions (Signed)
 Continue taking warfarin 1.5 tablets daily except for 1 tablet on Wednesday. Recheck INR in 6 weeks. Coumadin  Clinic [phone]; Procedure Fax Number [phone] or [phone]

## 2024-03-28 ENCOUNTER — Ambulatory Visit: Admitting: Family Medicine

## 2024-04-19 ENCOUNTER — Other Ambulatory Visit (HOSPITAL_COMMUNITY): Payer: Self-pay

## 2024-04-19 ENCOUNTER — Other Ambulatory Visit: Payer: Self-pay

## 2024-04-20 ENCOUNTER — Other Ambulatory Visit (HOSPITAL_COMMUNITY): Payer: Self-pay

## 2024-04-25 DIAGNOSIS — Z79899 Other long term (current) drug therapy: Secondary | ICD-10-CM | POA: Diagnosis not present

## 2024-04-25 DIAGNOSIS — R9431 Abnormal electrocardiogram [ECG] [EKG]: Secondary | ICD-10-CM | POA: Diagnosis not present

## 2024-04-25 DIAGNOSIS — I10 Essential (primary) hypertension: Secondary | ICD-10-CM | POA: Diagnosis not present

## 2024-04-25 DIAGNOSIS — I700 Atherosclerosis of aorta: Secondary | ICD-10-CM | POA: Diagnosis not present

## 2024-04-25 DIAGNOSIS — R200 Anesthesia of skin: Secondary | ICD-10-CM | POA: Diagnosis not present

## 2024-04-25 DIAGNOSIS — Z7982 Long term (current) use of aspirin: Secondary | ICD-10-CM | POA: Diagnosis not present

## 2024-04-25 DIAGNOSIS — S4291XA Fracture of right shoulder girdle, part unspecified, initial encounter for closed fracture: Secondary | ICD-10-CM | POA: Diagnosis not present

## 2024-04-25 DIAGNOSIS — Z952 Presence of prosthetic heart valve: Secondary | ICD-10-CM | POA: Diagnosis not present

## 2024-04-25 DIAGNOSIS — Z7901 Long term (current) use of anticoagulants: Secondary | ICD-10-CM | POA: Diagnosis not present

## 2024-04-25 DIAGNOSIS — M79631 Pain in right forearm: Secondary | ICD-10-CM | POA: Diagnosis not present

## 2024-04-25 DIAGNOSIS — E785 Hyperlipidemia, unspecified: Secondary | ICD-10-CM | POA: Diagnosis not present

## 2024-04-25 DIAGNOSIS — W1830XA Fall on same level, unspecified, initial encounter: Secondary | ICD-10-CM | POA: Diagnosis not present

## 2024-04-25 DIAGNOSIS — Z8673 Personal history of transient ischemic attack (TIA), and cerebral infarction without residual deficits: Secondary | ICD-10-CM | POA: Diagnosis not present

## 2024-04-25 DIAGNOSIS — M25511 Pain in right shoulder: Secondary | ICD-10-CM | POA: Diagnosis not present

## 2024-04-25 DIAGNOSIS — S43014A Anterior dislocation of right humerus, initial encounter: Secondary | ICD-10-CM | POA: Diagnosis not present

## 2024-04-25 NOTE — Progress Notes (Signed)
 Brief orthopedic note  Right shoulder was successfully reduced under sedation which was visible on portable shoulder x-rays from 1450.  After the successful reduction patient had complete return of radial distribution sensation as well as wrist extension and thumb extension.   Nevertheless, I was informed that while attaining x-rays at 1729, shoulder was redislocated.  At this time the patient once again lost radial nerve sensation, and was unable to extend thumb or wrist.  She underwent re, relocation of her RIGHT shoulder which was successful as demonstrated by x-rays at 1933.  However, radial nerve sensation, thumb and wrist extension are weak.  Patient and family are from [place]  and already have follow-up scheduled with his shoulder surgeon on Wednesday.  I have told them to maintain the sling until then.  She should wear a cock up wrist splint for her wrist drop.

## 2024-04-27 DIAGNOSIS — M25311 Other instability, right shoulder: Secondary | ICD-10-CM | POA: Diagnosis not present

## 2024-05-02 ENCOUNTER — Ambulatory Visit: Attending: Cardiovascular Disease

## 2024-05-02 DIAGNOSIS — I340 Nonrheumatic mitral (valve) insufficiency: Secondary | ICD-10-CM

## 2024-05-02 DIAGNOSIS — Z9889 Other specified postprocedural states: Secondary | ICD-10-CM | POA: Diagnosis not present

## 2024-05-02 DIAGNOSIS — Z7901 Long term (current) use of anticoagulants: Secondary | ICD-10-CM | POA: Diagnosis not present

## 2024-05-02 LAB — POCT INR: INR: 2.3 (ref 2.0–3.0)

## 2024-05-02 NOTE — Patient Instructions (Signed)
 Continue taking warfarin 1.5 tablets daily except for 1 tablet on Wednesday. Recheck INR in 6 weeks. Coumadin  Clinic [phone]; Procedure Fax Number [phone] or [phone]

## 2024-05-11 ENCOUNTER — Other Ambulatory Visit (HOSPITAL_COMMUNITY): Payer: Self-pay

## 2024-05-11 DIAGNOSIS — M25311 Other instability, right shoulder: Secondary | ICD-10-CM | POA: Diagnosis not present

## 2024-05-11 MED ORDER — TRAMADOL HCL 50 MG PO TABS
50.0000 mg | ORAL_TABLET | Freq: Three times a day (TID) | ORAL | 0 refills | Status: DC | PRN
  Filled 2024-05-11: qty 15, 5d supply, fill #0

## 2024-05-12 DIAGNOSIS — M6281 Muscle weakness (generalized): Secondary | ICD-10-CM | POA: Diagnosis not present

## 2024-05-12 DIAGNOSIS — M25531 Pain in right wrist: Secondary | ICD-10-CM | POA: Diagnosis not present

## 2024-05-18 ENCOUNTER — Other Ambulatory Visit (HOSPITAL_COMMUNITY): Payer: Self-pay

## 2024-05-18 ENCOUNTER — Other Ambulatory Visit: Payer: Self-pay

## 2024-05-19 DIAGNOSIS — M25531 Pain in right wrist: Secondary | ICD-10-CM | POA: Diagnosis not present

## 2024-05-19 DIAGNOSIS — M6281 Muscle weakness (generalized): Secondary | ICD-10-CM | POA: Diagnosis not present

## 2024-05-23 DIAGNOSIS — M25531 Pain in right wrist: Secondary | ICD-10-CM | POA: Diagnosis not present

## 2024-05-23 DIAGNOSIS — M6281 Muscle weakness (generalized): Secondary | ICD-10-CM | POA: Diagnosis not present

## 2024-05-31 ENCOUNTER — Other Ambulatory Visit (HOSPITAL_COMMUNITY): Payer: Self-pay

## 2024-06-02 DIAGNOSIS — M25531 Pain in right wrist: Secondary | ICD-10-CM | POA: Diagnosis not present

## 2024-06-02 DIAGNOSIS — M6281 Muscle weakness (generalized): Secondary | ICD-10-CM | POA: Diagnosis not present

## 2024-06-06 ENCOUNTER — Encounter: Payer: Self-pay | Admitting: Family Medicine

## 2024-06-06 ENCOUNTER — Other Ambulatory Visit: Payer: Self-pay

## 2024-06-06 ENCOUNTER — Ambulatory Visit: Admitting: Family Medicine

## 2024-06-06 ENCOUNTER — Ambulatory Visit (INDEPENDENT_AMBULATORY_CARE_PROVIDER_SITE_OTHER)

## 2024-06-06 VITALS — BP 110/70 | HR 71 | Ht 61.0 in

## 2024-06-06 DIAGNOSIS — M5416 Radiculopathy, lumbar region: Secondary | ICD-10-CM

## 2024-06-06 DIAGNOSIS — M65342 Trigger finger, left ring finger: Secondary | ICD-10-CM | POA: Diagnosis not present

## 2024-06-06 DIAGNOSIS — M79606 Pain in leg, unspecified: Secondary | ICD-10-CM | POA: Diagnosis not present

## 2024-06-06 DIAGNOSIS — M6281 Muscle weakness (generalized): Secondary | ICD-10-CM | POA: Diagnosis not present

## 2024-06-06 DIAGNOSIS — M4317 Spondylolisthesis, lumbosacral region: Secondary | ICD-10-CM | POA: Diagnosis not present

## 2024-06-06 DIAGNOSIS — M25531 Pain in right wrist: Secondary | ICD-10-CM | POA: Diagnosis not present

## 2024-06-06 DIAGNOSIS — M47816 Spondylosis without myelopathy or radiculopathy, lumbar region: Secondary | ICD-10-CM | POA: Diagnosis not present

## 2024-06-06 NOTE — Patient Instructions (Addendum)
 Thank you for coming in today.   Please get an Xray today before you leave   Keep your schedule appointment with Dr. Romona Savannah placed a referral for a back injection, someone should reach out about scheduling over the next week. The number to [facility] is [phone] if you do not hear from them, feel free to reach out.   You received an injection today. Seek immediate medical attention if the joint becomes red, extremely painful, or is oozing fluid.   See you back as needed. As a reminder, Dr. Joane will be out of the office for the month of August through Sept 15th 2025.

## 2024-06-06 NOTE — Progress Notes (Signed)
 I, Leotis Batter, CMA acting as a scribe for Artist Lloyd, MD.  [name] [name] is a 78 y.o. female who presents to [facility] today for L hand pain. Pt was previously seen by Dr. Claudene on 06/09/23 for R hand and LBP.  Today, pt c/o L hand pain x several weeks. Pt locates pain to the left 4th digit. Has responded well to injection in the past with at [facility], last about 2.5 months ago   Radiates: left 4th digit Paresthesia: intermittently Grip strength: normal, except at the 4th digit Aggravates: finger ROM Treatments tried: rest, splinting  Also c/o exacerbation of left L5-type symptoms.  She denies any weakness.  Pain radiates from her left lateral hip and buttocks to the left lateral thigh and left lateral calf and foot.  She did have an MRI of the lumbar spine about a year ago which does show the potential for nerve impingement.  Dx testing: 06/09/22 DEXA scan  Pertinent review of systems: No fevers or chills  Relevant historical information: Recurrent left trigger finger.  Osteoporosis. Anticoagulated with warfarin.  Exam:  BP 110/70   Pulse 71   Ht 5' 1 (1.549 m)   SpO2 97%   BMI 25.77 kg/m  General: Well Developed, well nourished, and in no acute distress.   MSK: Left hand normal-appearing Triggering present with flexion of left PIP joint.  Intact strength.  L-spine: Normal appearing Decreased lumbar motion. Lower extremity strength is intact.    Lab and Radiology Results  Procedure: Real-time Ultrasound Guided Injection of left fourth MCP tendon sheath at A1 pulley.  Trigger finger injection Device: Philips Affiniti 50G/GE Logiq Images permanently stored and available for review in PACS Verbal informed consent obtained.  Discussed risks and benefits of procedure. Warned about infection, bleeding, hyperglycemia damage to structures among others. Patient expresses understanding and agreement Time-out conducted.    Noted no overlying erythema, induration, or other signs of local infection.   Skin prepped in a sterile fashion.   Local anesthesia: Topical Ethyl chloride.   With sterile technique and under real time ultrasound guidance: 40 mg of Kenalog  and 1 mL of lidocaine  injected into A1 pulley tendon sheath. Fluid seen entering the tendon sheath.   Completed without difficulty   Pain immediately resolved suggesting accurate placement of the medication.   Advised to call if fevers/chills, erythema, induration, drainage, or persistent bleeding.   Images permanently stored and available for review in the ultrasound unit.  Impression: Technically successful ultrasound guided injection.  X-ray images lumbar spine obtained today personally and independently interpreted. Multilevel degenerative changes.  No acute fractures are visible. Await formal radiology review    Assessment and Plan: 78 y.o. female with left fourth trigger finger.  This is a chronic problem with an acute recurrence.  She had an injection almost 3 months ago with Dr. Romona at [facility].  She has an appointment with him again in about a month.  Plan for repeat injection but I have encouraged her to keep her appointment with her hand surgeon as I think is likely that she will require surgery.  Left L5 radiculopathy.  Patient does have an older MRI about a year old now which does show the potential for pinched nerve.  Plan for updated x-ray and epidural steroid injection.  She is anticoagulated with warfarin so she will probably need a Lovenox  bridge.  I sent a message to her PCP and cardiologist to help manage this.  PDMP not reviewed this encounter. Orders Placed This Encounter  Procedures   US  LIMITED JOINT SPACE STRUCTURES UP LEFT(NO LINKED CHARGES)    Reason for Exam (SYMPTOM  OR DIAGNOSIS REQUIRED):   left trigger finger    Preferred imaging location?:   [facility]   No orders of the  defined types were placed in this encounter.    Discussed warning signs or symptoms. Please see discharge instructions. Patient expresses understanding.   The above documentation has been reviewed and is accurate and complete Artist Lloyd, M.D.

## 2024-06-07 ENCOUNTER — Ambulatory Visit: Payer: Self-pay | Admitting: Family Medicine

## 2024-06-07 ENCOUNTER — Ambulatory Visit: Payer: Medicare HMO | Admitting: Neurology

## 2024-06-07 NOTE — Progress Notes (Signed)
 Medium to severe arthritis in the low back especially in the facet joints.  This could cause back pain.  If the injection does not work we can get an updated MRI.

## 2024-06-11 ENCOUNTER — Other Ambulatory Visit (HOSPITAL_COMMUNITY): Payer: Self-pay

## 2024-06-13 ENCOUNTER — Ambulatory Visit: Attending: Cardiovascular Disease

## 2024-06-13 DIAGNOSIS — I340 Nonrheumatic mitral (valve) insufficiency: Secondary | ICD-10-CM | POA: Diagnosis not present

## 2024-06-13 DIAGNOSIS — Z7901 Long term (current) use of anticoagulants: Secondary | ICD-10-CM

## 2024-06-13 DIAGNOSIS — Z9889 Other specified postprocedural states: Secondary | ICD-10-CM

## 2024-06-13 LAB — POCT INR: INR: 4.2 — AB (ref 2.0–3.0)

## 2024-06-13 NOTE — Patient Instructions (Signed)
 Hold today only then Continue taking warfarin 1.5 tablets daily except for 1 tablet on Wednesday. Recheck INR in 4 weeks. Coumadin  Clinic [phone]; Procedure Fax Number [phone] or [phone]

## 2024-06-13 NOTE — Progress Notes (Signed)
 INR 4.2  Please see anticoagulation encounter

## 2024-06-27 ENCOUNTER — Other Ambulatory Visit: Payer: Self-pay

## 2024-06-27 ENCOUNTER — Other Ambulatory Visit (HOSPITAL_COMMUNITY): Payer: Self-pay

## 2024-06-27 DIAGNOSIS — Z9889 Other specified postprocedural states: Secondary | ICD-10-CM

## 2024-06-27 MED ORDER — WARFARIN SODIUM 2 MG PO TABS
2.0000 mg | ORAL_TABLET | Freq: Every day | ORAL | 1 refills | Status: DC
  Filled 2024-06-27: qty 120, 90d supply, fill #0

## 2024-06-28 ENCOUNTER — Other Ambulatory Visit: Payer: Self-pay

## 2024-07-11 ENCOUNTER — Ambulatory Visit: Attending: Cardiovascular Disease | Admitting: *Deleted

## 2024-07-11 DIAGNOSIS — I340 Nonrheumatic mitral (valve) insufficiency: Secondary | ICD-10-CM

## 2024-07-11 DIAGNOSIS — Z7901 Long term (current) use of anticoagulants: Secondary | ICD-10-CM

## 2024-07-11 DIAGNOSIS — Z9889 Other specified postprocedural states: Secondary | ICD-10-CM | POA: Diagnosis not present

## 2024-07-11 LAB — POCT INR: INR: 3.7 — AB (ref 2.0–3.0)

## 2024-07-11 NOTE — Progress Notes (Signed)
 INR 3.7 Please see anticoagulation encounter

## 2024-07-11 NOTE — Patient Instructions (Signed)
 Description   Do not take any warfarin today then continue taking warfarin 1.5 tablets daily except for 1 tablet on Wednesday. Recheck INR in 3 weeks. Coumadin  Clinic [phone]; Procedure Fax Number [phone] or [phone]

## 2024-07-12 DIAGNOSIS — M6281 Muscle weakness (generalized): Secondary | ICD-10-CM | POA: Diagnosis not present

## 2024-07-12 DIAGNOSIS — M25531 Pain in right wrist: Secondary | ICD-10-CM | POA: Diagnosis not present

## 2024-07-18 DIAGNOSIS — M25511 Pain in right shoulder: Secondary | ICD-10-CM | POA: Diagnosis not present

## 2024-07-18 DIAGNOSIS — M542 Cervicalgia: Secondary | ICD-10-CM | POA: Diagnosis not present

## 2024-07-19 ENCOUNTER — Other Ambulatory Visit (HOSPITAL_COMMUNITY): Payer: Self-pay

## 2024-07-20 ENCOUNTER — Other Ambulatory Visit: Payer: Self-pay | Admitting: Orthopedic Surgery

## 2024-07-20 DIAGNOSIS — M25511 Pain in right shoulder: Secondary | ICD-10-CM

## 2024-07-22 ENCOUNTER — Ambulatory Visit (HOSPITAL_COMMUNITY)
Admission: RE | Admit: 2024-07-22 | Discharge: 2024-07-22 | Disposition: A | Discharge status: Home / Self Care | Source: Ambulatory Visit | Attending: Cardiology | Admitting: Cardiology

## 2024-07-22 ENCOUNTER — Encounter (HOSPITAL_COMMUNITY): Payer: Self-pay

## 2024-07-22 DIAGNOSIS — E782 Mixed hyperlipidemia: Secondary | ICD-10-CM | POA: Insufficient documentation

## 2024-07-22 DIAGNOSIS — I340 Nonrheumatic mitral (valve) insufficiency: Secondary | ICD-10-CM | POA: Diagnosis not present

## 2024-07-22 DIAGNOSIS — I10 Essential (primary) hypertension: Secondary | ICD-10-CM | POA: Insufficient documentation

## 2024-07-22 NOTE — Progress Notes (Signed)
 [name] [address] [place] [address] Phone: [phone] Subjective:   LILLETTE Berwyn Posey, am serving as a scribe for Dr. Arthea Claudene. I'm seeing this patient by the request  of:  [place], Betty G, MD  CC: Lower back pain  YEP:Dlagzrupcz   06/06/24 Patient saw Dr. Joane for trigger ring finger in left hand and lumbar radiculitis.   07/26/24 Trannie Bardales is a 78 y.o. female coming in with complaint of lower back pain and R shoulder pain. Back pain seems to be getting worse. Using arnica lotion. L foot tingling to the L knee for past month intermittently.   Pain also in neck due to not being able to move shoulder. Burning in nature. Also is unable to fully make fist or extend fingers.     Patient did have x-rays taken back in July.  These were independently visualized by me.  Moderate to severe arthritic changes noted of the lumbar spine and the facet with degenerative disc disease.  Medication patient is on includes gabapentin  and patient is on Coumadin  which makes anti-inflammatories impossible.  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   Dr Waylon, Veverly Persons, Dr Skeet     Abnormal CT scan of lung 02/06/2021   Acute bursitis of right shoulder 07/22/2022   Injection given July 22, 2022     Acute post-traumatic headache, not intractable 11/15/2019   Allergic rhinitis    Atypical chest pain    Bilateral carpal tunnel syndrome 07/17/2017   EMG, Modena Callander, M.D  06/2017     Bilateral primary osteoarthritis of knee 10/03/2013   Bilateral injections given May 09, 2020     Bilateral shoulder pain 12/11/2017   Binocular vision disorder with diplopia 07/29/2021   Carbuncle of groin 02/25/2019   Cardiomegaly 02/05/2021   Cervicalgia 10/28/2022   Cervicogenic headache 06/03/2018   Chest pain 02/05/2021   Chronic midline low back pain without sciatica 09/15/2016   Conductive hearing loss in right  ear 12/02/2016   Degenerative spondylolisthesis 05/06/2022   Diverticulosis    Dupuytren's contracture of hand 07/20/2017   Epigastric pain 12/26/2010   Essential hypertension 09/27/2010   Gastritis 11/08/2007, 04/12/2012   H pylori bx neg 03/2012   Generalized osteoarthritis of multiple sites 06/30/2017   GERD (gastroesophageal reflux disease) 11/08/2007   Heart failure 02/21/2021   Hepatic cyst    Hiatal hernia    11/08/2007, 04/12/2012   Hyperlipidemia    IBS (irritable bowel syndrome)    diarrhea predom   Joint pain 06/03/2016   Lateral meniscal tear 10/03/2013   Degenerative seen on ultrasound October 03, 2013     Left lower quadrant abdominal pain 05/01/2020   Leg cramps 06/10/2017   Long term (current) use of anticoagulants 05/29/2021   Migraine headache 09/27/2010   Mild cognitive impairment 08/18/2023   Mitral regurgitation    Murmur 02/21/2021   Osteopenia 07/20/2016   dexa 01/2019: Spine -2.2, RFN -2.2, LFN -2.0, decrease since prior scan.. frax -12.5%, 3%-we will look into Prolia -if not covered then Reclast  DEXA from [place]-osteoporosis  Prior dexa scans have showed osteoporosis     Osteoporosis    DEXA 01/21/2012: -2.1 spine and R fem, -1.8 L fem s/p fosamax  2004-2011 DEXA 04/18/14 @ [facility]: -2.5 - rec to start Prolia      Paroxysmal atrial fibrillation 06/19/2021   Patellofemoral pain syndrome 10/03/2013   Personal history of urinary calculi 09/27/2010  Prediabetes 05/02/2019   PVC's (premature ventricular contractions)    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   Complex valvuloplasty including PTFE neochord placement x6 with 30 mm Medtronic Simuform ring annuloplasty with clipping of LA appendage   Schatzki's ring 11/11/2010   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   Shingles outbreak 04/2013   Skin tag 10/28/2022   Spinal stenosis, lumbar region, with neurogenic claudication 06/09/2023   Trigger finger, left ring finger 05/22/2022   Injection given  May 22, 2022 repeat injection given September 29, 2022     Trigger finger, right little finger 09/11/2017   Injected 09/11/2017.  Repeat injection given January 22, 2023     Trigger thumb of right hand 01/22/2023   Injection given January 22, 2023 repeat at the A1 pulley Apr 02, 2023     Varicose vein of leg    Past Surgical History:  Procedure Laterality Date   ANTERIOR CERVICAL DECOMP/DISCECTOMY FUSION N/A 05/06/2022   Procedure: CERVICAL THREE-FOUR, CERVICAL FOUR-FIVE ANTERIOR CERVICAL DECOMPRESSION/DISCECTOMY FUSION;  Surgeon: Louis Shove, MD;  Location: [facility] OR;  Service: Neurosurgery;  Laterality: N/A;   APPENDECTOMY  1958   BUBBLE STUDY  03/14/2021   Procedure: BUBBLE STUDY;  Surgeon: Shlomo Wilbert SAUNDERS, MD;  Location: [facility] ENDOSCOPY;  Service: Cardiovascular;;   CARDIAC CATHETERIZATION     CLIPPING OF ATRIAL APPENDAGE N/A 05/21/2021   Procedure: CLIPPING OF ATRIAL APPENDAGE USING ATRICURE PRO2 CLIP;  Surgeon: Dusty Sudie DEL, MD;  Location: [facility] OR;  Service: Open Heart Surgery;  Laterality: N/A;   COLONOSCOPY     CYSTOCELE REPAIR  2008   ESOPHAGOGASTRODUODENOSCOPY     Maxilofacial  1992   MITRAL VALVE REPAIR  05/21/2021   Procedure: MINIMALLY INVASIVE MITRAL VALVE REPAIR (MVR) USING MEDTRONIC SIMUFORM RING;  Surgeon: Dusty Sudie DEL, MD;  Location: [facility] OR;  Service: Open Heart Surgery;;   RIGHT/LEFT HEART CATH AND CORONARY ANGIOGRAPHY N/A 03/14/2021   Procedure: RIGHT/LEFT HEART CATH AND CORONARY ANGIOGRAPHY;  Surgeon: Court Dorn PARAS, MD;  Location: [facility] INVASIVE CV LAB;  Service: Cardiovascular;  Laterality: N/A;   TEE WITHOUT CARDIOVERSION N/A 03/14/2021   Procedure: TRANSESOPHAGEAL ECHOCARDIOGRAM (TEE);  Surgeon: Shlomo Wilbert SAUNDERS, MD;  Location: [facility] ENDOSCOPY;  Service: Cardiovascular;  Laterality: N/A;   TEE WITHOUT CARDIOVERSION N/A 05/21/2021   Procedure: TRANSESOPHAGEAL ECHOCARDIOGRAM (TEE);  Surgeon: Dusty Sudie DEL, MD;  Location: [facility] OR;  Service: Open Heart Surgery;   Laterality: N/A;   TONSILLECTOMY  1960   Social History   Socioeconomic History   Marital status: Married    Spouse name: Erla   Number of children: 3   Years of education: 18   Highest education level: Master's degree (e.g., MA, MS, MEng, MEd, MSW, MBA)  Occupational History   Occupation: Retired  Tobacco Use   Smoking status: Never   Smokeless tobacco: Never  Vaping Use   Vaping status: Never Used  Substance and Sexual Activity   Alcohol  use: No   Drug use: No   Sexual activity: Not on file  Other Topics Concern   Not on file  Social History Narrative   Married, lives with spouse. Retired Comptroller, Manufacturing systems engineer. Has 3 kids (moved to [place] to be closer)- youngest son MD. [place] to [place] from [place]  06/2010, lived in [place] x 10years   Social Drivers of Health   Financial Resource Strain: Low Risk  (05/15/2023)   Overall Financial Resource Strain (CARDIA)    Difficulty of Paying Living Expenses: Not hard at all  Food  Insecurity: No Food Insecurity (05/15/2023)   Hunger Vital Sign    Worried About Running Out of Food in the Last Year: Never true    Ran Out of Food in the Last Year: Never true  Transportation Needs: No Transportation Needs (05/15/2023)   PRAPARE - Administrator, Civil Service (Medical): No    Lack of Transportation (Non-Medical): No  Physical Activity: Insufficiently Active (05/15/2023)   Exercise Vital Sign    Days of Exercise per Week: 3 days    Minutes of Exercise per Session: 30 min  Stress: No Stress Concern Present (05/15/2023)   [employer] of Occupational Health - Occupational Stress Questionnaire    Feeling of Stress : Not at all  Social Connections: Socially Integrated (05/15/2023)   Social Connection and Isolation Panel    Frequency of Communication with Friends and Family: More than three times a week    Frequency of Social Gatherings with Friends and Family: More than three times a week    Attends Religious Services: More  than 4 times per year    Active Member of [employer] or Organizations: Yes    Attends Engineer, structural: More than 4 times per year    Marital Status: Married   No Known Allergies Family History  Problem Relation Age of Onset   Hypertension Mother    Alzheimer's disease Mother 19   AAA (abdominal aortic aneurysm) Mother    Stroke Father 9   Kidney cancer Brother        mets   Bone cancer Brother 19   Colon cancer Neg Hx      Current Outpatient Medications (Cardiovascular):    amLODipine  (NORVASC ) 5 MG tablet, Take 1 tablet (5 mg total) by mouth daily.   atorvastatin  (LIPITOR) 20 MG tablet, Take 1 tablet (20 mg total) by mouth daily.   metoprolol  succinate (TOPROL -XL) 25 MG 24 hr tablet, TAKE ONE TABLET BY MOUTH EVERY MORNING & TAKE ONE TABLET BY MOUTH EVERY EVENING   metoprolol  succinate (TOPROL -XL) 25 MG 24 hr tablet, Take 1 tablet (25 mg total) by mouth 2 (two) times daily.  Current Outpatient Medications (Respiratory):    benzonatate  (TESSALON ) 100 MG capsule, Take 1 capsule (100 mg total) by mouth every 8 (eight) hours.  Current Outpatient Medications (Analgesics):    acetaminophen  (TYLENOL ) 650 MG CR tablet, Take 650 mg by mouth every 8 (eight) hours as needed for pain (Headache).   aspirin  EC 81 MG EC tablet, Take 1 tablet (81 mg total) by mouth daily. Swallow whole.   traMADol  (ULTRAM ) 50 MG tablet, Take 1 tablet (50 mg total) by mouth 3 (three) times daily as needed FOR PAIN.  Current Outpatient Medications (Hematological):    warfarin (COUMADIN ) 2 MG tablet, Take 1-1.5 tablets (2-3 mg total) by mouth daily or as directed by Anticoagulation Clinic.  Current Outpatient Medications (Other):    gabapentin  (NEURONTIN ) 100 MG capsule, Take 1 capsule (100 mg total) by mouth in the morning AND 2 capsules (200 mg total) every evening.   gabapentin  (NEURONTIN ) 100 MG capsule, Take 2 capsules (200 mg total) by mouth 3 (three) times daily.   Peppermint Oil (IBGARD) 90 MG  CPCR, Use as needed   Polyethyl Glycol-Propyl Glycol (SYSTANE) 0.4-0.3 % SOLN, Place 1 drop into both eyes 2 (two) times daily.   Reviewed prior external information including notes and imaging from  primary care provider As well as notes that were available from care everywhere and other healthcare systems.  Past medical history, social, surgical and family history all reviewed in electronic medical record.  No pertanent information unless stated regarding to the chief complaint.   Review of Systems:  No headache, visual changes, nausea, vomiting, diarrhea, constipation, dizziness, abdominal pain, skin rash, fevers, chills, night sweats, weight loss, swollen lymph nodes, body aches, joint swelling, chest pain, shortness of breath, mood changes. POSITIVE muscle aches  Objective  Blood pressure 120/72, pulse 71, height 5' 1 (1.549 m), weight 132 lb (59.9 kg), SpO2 97%.   General: No apparent distress alert and oriented x3 mood and affect normal, dressed appropriately.  HEENT: Pupils equal, extraocular movements intact  Respiratory: Patient's speak in full sentences and does not appear short of breath  Cardiovascular: No lower extremity edema, non tender, no erythema  Low back exam shows patient does have significant loss of lordosis noted.  Some tenderness to palpation diffusely.  Difficulty going from a seated to standing position.  Positive straight leg test on the left side.  Right arm exam shows significant weakness.  Difficulty moving patient's shoulder actively in any range of motion at the moment. Patient does have weakness noted of the hand especially and more on the radial distribution.  Patient has some mild atrophy noted between the thumb and the index finger on the dorsal aspect of the hand.  Grip strength is decreased.Diffuse tenderness over the shoulder.   Impression and Recommendations:     The above documentation has been reviewed and is accurate and complete Dua Mehler M  Sayed Apostol, DO

## 2024-07-23 LAB — ECHOCARDIOGRAM COMPLETE
Area-P 1/2: 2.42 cm2
MV VTI: 1.63 cm2
S' Lateral: 3.2 cm

## 2024-07-24 ENCOUNTER — Ambulatory Visit: Payer: Self-pay | Admitting: Cardiovascular Disease

## 2024-07-26 ENCOUNTER — Ambulatory Visit: Admitting: Family Medicine

## 2024-07-26 ENCOUNTER — Other Ambulatory Visit (HOSPITAL_COMMUNITY): Payer: Self-pay

## 2024-07-26 ENCOUNTER — Other Ambulatory Visit: Payer: Self-pay

## 2024-07-26 VITALS — BP 120/72 | HR 71 | Ht 61.0 in | Wt 132.0 lb

## 2024-07-26 DIAGNOSIS — M25511 Pain in right shoulder: Secondary | ICD-10-CM | POA: Diagnosis not present

## 2024-07-26 DIAGNOSIS — M542 Cervicalgia: Secondary | ICD-10-CM | POA: Diagnosis not present

## 2024-07-26 DIAGNOSIS — M48062 Spinal stenosis, lumbar region with neurogenic claudication: Secondary | ICD-10-CM

## 2024-07-26 MED ORDER — GABAPENTIN 100 MG PO CAPS
200.0000 mg | ORAL_CAPSULE | Freq: Three times a day (TID) | ORAL | 3 refills | Status: DC
  Filled 2024-07-26: qty 180, 30d supply, fill #0

## 2024-07-26 NOTE — Assessment & Plan Note (Signed)
 Neck pain noted, has had history of the carpal tunnel previously on the right side.  I do believe that there is a potential with dislocation of the shoulder do think that a nerve conduction study though could be beneficial to further evaluate as well as know if there is any long-term ramifications.  Patient's fracture dislocation of the shoulder was greater than 3 months ago at this time.  This should be a long enough to see if there is any nerve injury on a nerve conduction test.  Discussed increasing gabapentin  and will prescribed up to 200 mg 3 times a day.

## 2024-07-26 NOTE — Patient Instructions (Addendum)
 Gabapentin  200mg  : Start 100mg  a day 300mg  at night Neurology referral [facility] [phone] for epidural  Check coumadin  with cardiologist Dr. Dozier for MRI See you again in 6 weeks

## 2024-07-26 NOTE — Assessment & Plan Note (Signed)
 Unfortunately I think an exacerbation that could have occurred with patient not being quite as active at this moment.  Discussed with patient about icing regimen and home exercises, discussed which activities to do and which ones to avoid.  Increase activity slowly.  Discussed icing regimen.  Follow-up with me again in 6 to 8 weeks.

## 2024-07-27 ENCOUNTER — Other Ambulatory Visit (HOSPITAL_COMMUNITY): Payer: Self-pay

## 2024-07-28 ENCOUNTER — Encounter: Payer: Self-pay | Admitting: Orthopedic Surgery

## 2024-07-28 DIAGNOSIS — M6281 Muscle weakness (generalized): Secondary | ICD-10-CM | POA: Diagnosis not present

## 2024-07-28 DIAGNOSIS — M25531 Pain in right wrist: Secondary | ICD-10-CM | POA: Diagnosis not present

## 2024-08-01 ENCOUNTER — Ambulatory Visit: Attending: Cardiovascular Disease

## 2024-08-01 DIAGNOSIS — Z7901 Long term (current) use of anticoagulants: Secondary | ICD-10-CM | POA: Diagnosis not present

## 2024-08-01 DIAGNOSIS — Z9889 Other specified postprocedural states: Secondary | ICD-10-CM | POA: Diagnosis not present

## 2024-08-01 DIAGNOSIS — I340 Nonrheumatic mitral (valve) insufficiency: Secondary | ICD-10-CM | POA: Diagnosis not present

## 2024-08-01 LAB — POCT INR: INR: 2.2 (ref 2.0–3.0)

## 2024-08-01 NOTE — Progress Notes (Signed)
 INR 2.2 Please see anticoagulation encounter continue taking warfarin 1.5 tablets daily except for 1 tablet on Wednesday. Recheck INR in 5 weeks. Coumadin  Clinic [phone]; Procedure Fax Number [phone] or [phone]

## 2024-08-01 NOTE — Patient Instructions (Signed)
 continue taking warfarin 1.5 tablets daily except for 1 tablet on Wednesday. Recheck INR in 5 weeks. Coumadin  Clinic [phone]; Procedure Fax Number [phone] or [phone]

## 2024-08-02 ENCOUNTER — Ambulatory Visit
Admission: RE | Admit: 2024-08-02 | Discharge: 2024-08-02 | Disposition: A | Discharge status: Home / Self Care | Source: Ambulatory Visit | Attending: Orthopedic Surgery | Admitting: Orthopedic Surgery

## 2024-08-02 ENCOUNTER — Telehealth: Payer: Self-pay

## 2024-08-02 DIAGNOSIS — M19011 Primary osteoarthritis, right shoulder: Secondary | ICD-10-CM | POA: Diagnosis not present

## 2024-08-02 DIAGNOSIS — M25511 Pain in right shoulder: Secondary | ICD-10-CM

## 2024-08-02 NOTE — Telephone Encounter (Signed)
   Pre-operative Risk Assessment    [name]  DOB: 17-May-1946 MRN: [id number]   Date of last office visit: 11/16/23 DORN LESCHES, MD Date of next office visit: NONE   Request for Surgical Clearance    Procedure:  LUMBAR EPIDURAL  Date of Surgery:  Clearance TBD    PRE REQUEST URGENT                            Surgeon:  NOT INDICATED Surgeon's Group or Practice Name:  [facility] Phone number:  [phone] Fax number:  [phone]   Type of Clearance Requested:   - Medical  - Pharmacy:  Hold Warfarin (Coumadin ) 4 DAYS, ASPIRIN     Type of Anesthesia:  Not Indicated   Additional requests/questions:    Signed, Lucie DELENA Ku   08/02/2024, 9:57 AM

## 2024-08-04 NOTE — Telephone Encounter (Signed)
 Patient with diagnosis of afib on warfarin for anticoagulation.    Procedure: LUMBAR EPIDURAL  Date of procedure: Urgent   CHA2DS2-VASc Score = 7   This indicates a 11.2% annual risk of stroke. The patient's score is based upon: CHF History: 1 HTN History: 1 Diabetes History: 0 Stroke History: 2 Vascular Disease History: 0 Age Score: 2 Gender Score: 1      CrCl 51 ml/min Platelet count 237  Patient has not had an Afib/aflutter ablation or Watchman within the last 3 months or DCCV within the last 30 days   Per office protocol, patient can hold warfarin for 5 days prior to procedure.    Patient WILL need bridging with Lovenox  (enoxaparin ) around procedure.  She will need to let coumadin  clinic know as soon as scheduled so bridge can be coordinated.  **This guidance is not considered finalized until pre-operative APP has relayed final recommendations.**

## 2024-08-04 NOTE — Telephone Encounter (Addendum)
   Name: [name] [name]  DOB: [birth year]  MRN: [id number]  Primary Cardiologist: Dorn Lesches, MD   Preoperative team, please contact this patient and set up a phone call appointment for further preoperative risk assessment. Please obtain consent and complete medication review. Thank you for your help.  I confirm that guidance regarding antiplatelet and oral anticoagulation therapy has been completed and, if necessary, noted below.  I also confirmed the patient resides in the state of  . As per [facility] Medical Board telemedicine laws, the patient must reside in the state in which the provider is licensed.   Jon Nat Hails, PA 08/08/2024, 2:28 PM [facility]

## 2024-08-05 ENCOUNTER — Encounter: Payer: Self-pay | Admitting: Cardiovascular Disease

## 2024-08-05 NOTE — Telephone Encounter (Signed)
Patient called to follow-up on the status of her clearance.

## 2024-08-08 ENCOUNTER — Telehealth (HOSPITAL_BASED_OUTPATIENT_CLINIC_OR_DEPARTMENT_OTHER): Payer: Self-pay

## 2024-08-08 ENCOUNTER — Other Ambulatory Visit: Payer: Self-pay

## 2024-08-08 DIAGNOSIS — M816 Localized osteoporosis [Lequesne]: Secondary | ICD-10-CM

## 2024-08-08 MED ORDER — DENOSUMAB 60 MG/ML ~~LOC~~ SOSY
60.0000 mg | PREFILLED_SYRINGE | SUBCUTANEOUS | Status: AC
  Administered 2024-09-08: 60 mg via SUBCUTANEOUS

## 2024-08-08 NOTE — Telephone Encounter (Signed)
 Called patient to set up a televisit for pre-op clearance( LUMBAR EPIDURAL)  on 08/12/24 @ 11:20. Meds, Rec, and consent done.

## 2024-08-08 NOTE — Progress Notes (Signed)
 Pt on Bone Density Report. Order placed for PA.

## 2024-08-08 NOTE — Telephone Encounter (Signed)
 Called to set up an televisit on 08/12/24 @11 :20 for pre-op clearance. Meds, Rec, and consent done.      Patient Consent for Virtual Visit        [name] [name] has provided verbal consent on 08/08/2024 for a virtual visit (video or telephone).   CONSENT FOR VIRTUAL VISIT FOR:  [name] [name]  By participating in this virtual visit I agree to the following:  I hereby voluntarily request, consent and authorize [facility] and its employed or contracted physicians, physician assistants, nurse practitioners or other licensed health care professionals (the Practitioner), to provide me with telemedicine health care services (the "Services) as deemed necessary by the treating Practitioner. I acknowledge and consent to receive the Services by the Practitioner via telemedicine. I understand that the telemedicine visit will involve communicating with the Practitioner through live audiovisual communication technology and the disclosure of certain medical information by electronic transmission. I acknowledge that I have been given the opportunity to request an in-person assessment or other available alternative prior to the telemedicine visit and am voluntarily participating in the telemedicine visit.  I understand that I have the right to withhold or withdraw my consent to the use of telemedicine in the course of my care at any time, without affecting my right to future care or treatment, and that the Practitioner or I may terminate the telemedicine visit at any time. I understand that I have the right to inspect all information obtained and/or recorded in the course of the telemedicine visit and may receive copies of available information for a reasonable fee.  I understand that some of the potential risks of receiving the Services via telemedicine include:  Delay or interruption in medical evaluation due to technological equipment failure or disruption; Information transmitted  may not be sufficient (e.g. poor resolution of images) to allow for appropriate medical decision making by the Practitioner; and/or  In rare instances, security protocols could fail, causing a breach of personal health information.  Furthermore, I acknowledge that it is my responsibility to provide information about my medical history, conditions and care that is complete and accurate to the best of my ability. I acknowledge that Practitioner's advice, recommendations, and/or decision may be based on factors not within their control, such as incomplete or inaccurate data provided by me or distortions of diagnostic images or specimens that may result from electronic transmissions. I understand that the practice of medicine is not an exact science and that Practitioner makes no warranties or guarantees regarding treatment outcomes. I acknowledge that a copy of this consent can be made available to me via my patient portal [facility] MyChart), or I can request a printed copy by calling the office of [facility].    I understand that my insurance will be billed for this visit.   I have read or had this consent read to me. I understand the contents of this consent, which adequately explains the benefits and risks of the Services being provided via telemedicine.  I have been provided ample opportunity to ask questions regarding this consent and the Services and have had my questions answered to my satisfaction. I give my informed consent for the services to be provided through the use of telemedicine in my medical care

## 2024-08-09 ENCOUNTER — Other Ambulatory Visit (HOSPITAL_COMMUNITY): Payer: Self-pay

## 2024-08-09 ENCOUNTER — Telehealth: Payer: Self-pay

## 2024-08-09 DIAGNOSIS — M6281 Muscle weakness (generalized): Secondary | ICD-10-CM | POA: Diagnosis not present

## 2024-08-09 DIAGNOSIS — M25531 Pain in right wrist: Secondary | ICD-10-CM | POA: Diagnosis not present

## 2024-08-09 NOTE — Telephone Encounter (Signed)
 SABRA

## 2024-08-09 NOTE — Telephone Encounter (Signed)
 Prolia  VOB initiated via [email or web address]  Next Prolia  inj DUE: 09/07/24

## 2024-08-09 NOTE — Telephone Encounter (Signed)
 Pt ready for scheduling for PROLIA  on or after : 09/07/24  Option# 1: Buy/Bill (Office supplied medication)  Out-of-pocket cost due at time of clinic visit: $357  Number of injection/visits approved: 2  Primary: [employer] Prolia  co-insurance: 20% Admin fee co-insurance: 20%  Secondary: --- Prolia  co-insurance:  Admin fee co-insurance:   Medical Benefit Details: Date Benefits were checked: 08/09/24 Deductible: NO/ Coinsurance: 20%/ Admin Fee: 20%  Prior Auth: APPROVED PA# [id number] Expiration Date: 03/03/24-03/03/25   # of doses approved: 2 ----------------------------------------------------------------------- Option# 2- Med Obtained from pharmacy:  Pharmacy benefit: Copay $645.79 (Paid to pharmacy) Admin Fee: 20% (Pay at clinic)  Prior Auth: N/A PA# Expiration Date:   # of doses approved:   If patient wants fill through the pharmacy benefit please send prescription to: [facility], and include estimated need by date in rx notes. Pharmacy will ship medication directly to the office.  Patient NOT eligible for Prolia  Copay Card. Copay Card can make patient's cost as little as $25. Link to apply: [email or web address]://[email or web address]  ** This summary of benefits is an estimation of the patient's out-of-pocket cost. Exact cost may very based on individual plan coverage.

## 2024-08-10 DIAGNOSIS — M75121 Complete rotator cuff tear or rupture of right shoulder, not specified as traumatic: Secondary | ICD-10-CM | POA: Diagnosis not present

## 2024-08-12 ENCOUNTER — Ambulatory Visit: Attending: Cardiology | Admitting: Emergency Medicine

## 2024-08-12 DIAGNOSIS — I517 Cardiomegaly: Secondary | ICD-10-CM | POA: Diagnosis not present

## 2024-08-12 NOTE — Progress Notes (Signed)
 Virtual Visit via Telephone Note   Because of [name] [name] co-morbid illnesses, she is at least at moderate risk for complications without adequate follow up.  This format is felt to be most appropriate for this patient at this time.  Due to technical limitations with video connection (technology), today's appointment will be conducted as an audio only telehealth visit, and [name] [name] verbally agreed to proceed in this manner.   All issues noted in this document were discussed and addressed.  No physical exam could be performed with this format.  Evaluation Performed:  Preoperative cardiovascular risk assessment _____________   Date:  08/12/2024   Patient ID:  [name] [name], [name] [name] 05-25-1946, MRN [id number] Patient Location:  Home Provider location:   Office  Primary Care Provider:  [place], Betty G, MD Primary Cardiologist:  Dorn Lesches, MD  Chief Complaint / Patient Profile   78 y.o. y/o female with a h/o HTN, HLD, cardiomegaly, GERD, mitral regurgitation s/p MVR on 05/21/2021, paroxysmal atrial fibrillation, who is pending lumbar epidural and presents today for telephonic preoperative cardiovascular risk assessment.  History of Present Illness    [name] [name] is a 78 y.o. female who presents via audio/video conferencing for a telehealth visit today.  Pt was last seen in cardiology clinic on 11/16/2023 by Dorn Lesches.  At that time Ijanae Macapagal was doing well.  The patient is now pending procedure as outlined above.   Since her last visit, she denies chest pain, palpitations, dyspnea, pnd, orthopnea, n, v, dizziness, syncope, edema, weight gain, or early satiety. All other systems reviewed and are otherwise negative except as noted above.   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   Dr Waylon, Veverly Persons, Dr Skeet     Abnormal CT scan of lung 02/06/2021   Acute  bursitis of right shoulder 07/22/2022   Injection given July 22, 2022     Acute post-traumatic headache, not intractable 11/15/2019   Allergic rhinitis    Atypical chest pain    Bilateral carpal tunnel syndrome 07/17/2017   EMG, Modena Callander, M.D  06/2017     Bilateral primary osteoarthritis of knee 10/03/2013   Bilateral injections given May 09, 2020     Bilateral shoulder pain 12/11/2017   Binocular vision disorder with diplopia 07/29/2021   Carbuncle of groin 02/25/2019   Cardiomegaly 02/05/2021   Cervicalgia 10/28/2022   Cervicogenic headache 06/03/2018   Chest pain 02/05/2021   Chronic midline low back pain without sciatica 09/15/2016   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   Degenerative spondylolisthesis 05/06/2022   Diverticulosis    Dupuytren's contracture of hand 07/20/2017   Epigastric pain 12/26/2010   Essential hypertension 09/27/2010   Gastritis 11/08/2007, 04/12/2012   H pylori bx neg 03/2012   Generalized osteoarthritis of multiple sites 06/30/2017   GERD (gastroesophageal reflux disease) 11/08/2007   Heart failure 02/21/2021   Hepatic cyst    Hiatal hernia    11/08/2007, 04/12/2012   Hyperlipidemia    IBS (irritable bowel syndrome)    diarrhea predom   Joint pain 06/03/2016   Lateral meniscal tear 10/03/2013   Degenerative seen on ultrasound October 03, 2013     Left lower quadrant abdominal pain 05/01/2020   Leg cramps 06/10/2017   Long term (current) use of anticoagulants 05/29/2021   Migraine headache 09/27/2010   Mild cognitive impairment 08/18/2023   Mitral regurgitation  Murmur 02/21/2021   Osteopenia 07/20/2016   dexa 01/2019: Spine -2.2, RFN -2.2, LFN -2.0, decrease since prior scan.. frax -12.5%, 3%-we will look into Prolia -if not covered then Reclast  DEXA from [place]-osteoporosis  Prior dexa scans have showed osteoporosis     Osteoporosis    DEXA 01/21/2012: -2.1 spine and R fem, -1.8 L fem s/p fosamax  2004-2011 DEXA 04/18/14 @ [facility]:  -2.5 - rec to start Prolia      Paroxysmal atrial fibrillation 06/19/2021   Patellofemoral pain syndrome 10/03/2013   Personal history of urinary calculi 09/27/2010   Prediabetes 05/02/2019   PVC's (premature ventricular contractions)    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   Complex valvuloplasty including PTFE neochord placement x6 with 30 mm Medtronic Simuform ring annuloplasty with clipping of LA appendage   Schatzki's ring 11/11/2010   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   Shingles outbreak 04/2013   Skin tag 10/28/2022   Spinal stenosis, lumbar region, 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  Repeat injection given January 22, 2023     Trigger thumb of right hand 01/22/2023   Injection given January 22, 2023 repeat at the A1 pulley Apr 02, 2023     Varicose vein of leg    Past Surgical History:  Procedure Laterality Date   ANTERIOR CERVICAL DECOMP/DISCECTOMY FUSION N/A 05/06/2022   Procedure: CERVICAL THREE-FOUR, CERVICAL FOUR-FIVE ANTERIOR CERVICAL DECOMPRESSION/DISCECTOMY FUSION;  Surgeon: Louis Shove, MD;  Location: [facility] OR;  Service: Neurosurgery;  Laterality: N/A;   APPENDECTOMY  1958   BUBBLE STUDY  03/14/2021   Procedure: BUBBLE STUDY;  Surgeon: Shlomo Wilbert SAUNDERS, MD;  Location: [facility] ENDOSCOPY;  Service: Cardiovascular;;   CARDIAC CATHETERIZATION     CLIPPING OF ATRIAL APPENDAGE N/A 05/21/2021   Procedure: CLIPPING OF ATRIAL APPENDAGE USING ATRICURE PRO2 CLIP;  Surgeon: Dusty Sudie DEL, MD;  Location: [facility] OR;  Service: Open Heart Surgery;  Laterality: N/A;   COLONOSCOPY     CYSTOCELE REPAIR  2008   ESOPHAGOGASTRODUODENOSCOPY     Maxilofacial  1992   MITRAL VALVE REPAIR  05/21/2021   Procedure: MINIMALLY INVASIVE MITRAL VALVE REPAIR (MVR) USING MEDTRONIC SIMUFORM  RING;  Surgeon: Dusty Sudie DEL, MD;  Location: [facility] OR;  Service: Open Heart Surgery;;   RIGHT/LEFT HEART CATH AND CORONARY ANGIOGRAPHY N/A 03/14/2021   Procedure: RIGHT/LEFT HEART CATH AND CORONARY ANGIOGRAPHY;  Surgeon: Court Dorn PARAS, MD;  Location: [facility] INVASIVE CV LAB;  Service: Cardiovascular;  Laterality: N/A;   TEE WITHOUT CARDIOVERSION N/A 03/14/2021   Procedure: TRANSESOPHAGEAL ECHOCARDIOGRAM (TEE);  Surgeon: Shlomo Wilbert SAUNDERS, MD;  Location: [facility] ENDOSCOPY;  Service: Cardiovascular;  Laterality: N/A;   TEE WITHOUT CARDIOVERSION N/A 05/21/2021   Procedure: TRANSESOPHAGEAL ECHOCARDIOGRAM (TEE);  Surgeon: Dusty Sudie DEL, MD;  Location: [facility] OR;  Service: Open Heart Surgery;  Laterality: N/A;   TONSILLECTOMY  1960    Allergies  No Known Allergies  Home Medications    Prior to Admission medications   Medication Sig Start Date End Date Taking? Authorizing Provider  acetaminophen  (TYLENOL ) 650 MG CR tablet Take 650 mg by mouth every 8 (eight) hours as needed for pain (Headache).    [provider]  amLODipine  (NORVASC ) 5 MG tablet Take 1 tablet (5 mg total) by mouth daily. 12/03/23   Court Dorn PARAS, MD  aspirin   EC 81 MG EC tablet Take 1 tablet (81 mg total) by mouth daily. Swallow whole. 05/26/21   Barrett, Erin R, PA-C  atorvastatin  (LIPITOR) 20 MG tablet Take 1 tablet (20 mg total) by mouth daily. 03/02/24   [place], Betty G, MD  benzonatate  (TESSALON ) 100 MG capsule Take 1 capsule (100 mg total) by mouth every 8 (eight) hours. 02/12/24   Mayer, Jodi R, NP  gabapentin  (NEURONTIN ) 100 MG capsule Take 1 capsule (100 mg total) by mouth in the morning AND 2 capsules (200 mg total) every evening. 12/21/23   Skeet Juliene SAUNDERS, DO  gabapentin  (NEURONTIN ) 100 MG capsule Take 2 capsules (200 mg total) by mouth 3 (three) times daily. 07/26/24   Claudene Arthea HERO, DO  metoprolol  succinate (TOPROL -XL) 25 MG 24 hr tablet TAKE ONE TABLET BY MOUTH EVERY MORNING & TAKE ONE TABLET BY MOUTH EVERY EVENING  05/25/23   Court Dorn PARAS, MD  metoprolol  succinate (TOPROL -XL) 25 MG 24 hr tablet Take 1 tablet (25 mg total) by mouth 2 (two) times daily. 12/03/23   Court Dorn PARAS, MD  Peppermint Oil (IBGARD) 90 MG CPCR Use as needed 10/30/21   Armbruster, Elspeth SQUIBB, MD  Polyethyl Glycol-Propyl Glycol (SYSTANE) 0.4-0.3 % SOLN Place 1 drop into both eyes 2 (two) times daily.    [provider]  traMADol  (ULTRAM ) 50 MG tablet Take 1 tablet (50 mg total) by mouth 3 (three) times daily as needed FOR PAIN. 05/11/24     warfarin (COUMADIN ) 2 MG tablet Take 1-1.5 tablets (2-3 mg total) by mouth daily or as directed by Anticoagulation Clinic. 06/27/24   Court Dorn PARAS, MD    Physical Exam    Vital Signs:  Lilja Soland does not have vital signs available for review today.  Given telephonic nature of communication, physical exam is limited. AAOx3. NAD. Normal affect.  Speech and respirations are unlabored.  Accessory Clinical Findings    None  Assessment & Plan    1.  Preoperative Cardiovascular Risk Assessment:  According to the Revised Cardiac Risk Index (RCRI), her Perioperative Risk of Major Cardiac Event is (%): 0.4  Her Functional Capacity in METs is: 5.72 according to the Duke Activity Status Index (DASI). Therefore, based on ACC/AHA guidelines, patient would be at acceptable risk for the planned procedure without further cardiovascular testing.   The patient was advised that if she develops new symptoms prior to surgery to contact our office to arrange for a follow-up visit, and she verbalized understanding.  Per office protocol, patient can hold warfarin for 5 days prior to procedure.     Patient WILL need bridging with Lovenox  (enoxaparin ) around procedure.   She will need to let coumadin  clinic know as soon as scheduled so bridge can be coordinated.  A copy of this note will be routed to requesting surgeon.  Time:   Today, I have spent 8 minutes with the patient with  telehealth technology discussing medical history, symptoms, and management plan.     Caedyn Raygoza E Aggie Douse, NP  08/12/2024, 9:55 AM

## 2024-08-16 ENCOUNTER — Other Ambulatory Visit: Payer: Self-pay

## 2024-08-16 ENCOUNTER — Encounter: Payer: Self-pay | Admitting: Neurology

## 2024-08-16 DIAGNOSIS — H52221 Regular astigmatism, right eye: Secondary | ICD-10-CM | POA: Diagnosis not present

## 2024-08-16 DIAGNOSIS — H5212 Myopia, left eye: Secondary | ICD-10-CM | POA: Diagnosis not present

## 2024-08-16 DIAGNOSIS — H353131 Nonexudative age-related macular degeneration, bilateral, early dry stage: Secondary | ICD-10-CM | POA: Diagnosis not present

## 2024-08-16 DIAGNOSIS — H524 Presbyopia: Secondary | ICD-10-CM | POA: Diagnosis not present

## 2024-08-16 DIAGNOSIS — R202 Paresthesia of skin: Secondary | ICD-10-CM

## 2024-08-16 DIAGNOSIS — H40013 Open angle with borderline findings, low risk, bilateral: Secondary | ICD-10-CM | POA: Diagnosis not present

## 2024-08-16 DIAGNOSIS — H26493 Other secondary cataract, bilateral: Secondary | ICD-10-CM | POA: Diagnosis not present

## 2024-08-16 DIAGNOSIS — Z961 Presence of intraocular lens: Secondary | ICD-10-CM | POA: Diagnosis not present

## 2024-08-17 ENCOUNTER — Other Ambulatory Visit (HOSPITAL_COMMUNITY): Payer: Self-pay

## 2024-08-17 ENCOUNTER — Other Ambulatory Visit: Payer: Self-pay

## 2024-08-18 ENCOUNTER — Other Ambulatory Visit (HOSPITAL_COMMUNITY): Payer: Self-pay

## 2024-08-22 ENCOUNTER — Telehealth: Payer: Self-pay | Admitting: *Deleted

## 2024-08-22 NOTE — Telephone Encounter (Signed)
 Husband (on [place]) and wife on speaker called to report that patient has her injection procedure date and needs an appointment to have INR checked. Clearance completed on 08/02/24 Advised that per the clearance the patient needs an appointment to have lovenox  bridging instructions prior to just holding warfarin. They state she has done lovenox  injections before and have requested a day Ozell is available. Placed a note on the appointment that she needs a bridge and for Ozell to see.

## 2024-08-23 ENCOUNTER — Other Ambulatory Visit (HOSPITAL_COMMUNITY): Payer: Self-pay

## 2024-08-23 ENCOUNTER — Other Ambulatory Visit: Payer: Self-pay | Admitting: Family Medicine

## 2024-08-23 DIAGNOSIS — M25531 Pain in right wrist: Secondary | ICD-10-CM | POA: Diagnosis not present

## 2024-08-23 DIAGNOSIS — M6281 Muscle weakness (generalized): Secondary | ICD-10-CM | POA: Diagnosis not present

## 2024-08-23 DIAGNOSIS — E782 Mixed hyperlipidemia: Secondary | ICD-10-CM

## 2024-08-23 MED ORDER — ATORVASTATIN CALCIUM 20 MG PO TABS
20.0000 mg | ORAL_TABLET | Freq: Every day | ORAL | 1 refills | Status: DC
  Filled 2024-09-14: qty 90, 90d supply, fill #0
  Filled 2024-12-11: qty 90, 90d supply, fill #1

## 2024-08-25 ENCOUNTER — Ambulatory Visit: Attending: Cardiovascular Disease

## 2024-08-25 ENCOUNTER — Other Ambulatory Visit: Payer: Self-pay

## 2024-08-25 ENCOUNTER — Other Ambulatory Visit (HOSPITAL_COMMUNITY): Payer: Self-pay

## 2024-08-25 DIAGNOSIS — Z7901 Long term (current) use of anticoagulants: Secondary | ICD-10-CM | POA: Diagnosis not present

## 2024-08-25 DIAGNOSIS — I340 Nonrheumatic mitral (valve) insufficiency: Secondary | ICD-10-CM | POA: Diagnosis not present

## 2024-08-25 DIAGNOSIS — Z9889 Other specified postprocedural states: Secondary | ICD-10-CM

## 2024-08-25 LAB — POCT INR: INR: 3.1 — AB (ref 2.0–3.0)

## 2024-08-25 MED ORDER — ENOXAPARIN SODIUM 60 MG/0.6ML IJ SOSY
60.0000 mg | PREFILLED_SYRINGE | Freq: Two times a day (BID) | INTRAMUSCULAR | 1 refills | Status: DC
  Filled 2024-08-25 (×2): qty 12, 10d supply, fill #0
  Filled 2024-10-05: qty 12, 10d supply, fill #1

## 2024-08-25 NOTE — Patient Instructions (Signed)
 continue taking warfarin 1.5 tablets daily except for 1 tablet on Wednesday. Eat greens tonight. Recheck INR in 2 weeks. Coumadin  Clinic [phone]; Procedure Fax Number [phone] or [phone] Epidural Injection 10/9 Holding Coumadin  10/4-8 10/3: Last dose of warfarin.  10/4: No warfarin or enoxaparin  (Lovenox ).  10/5: Inject enoxaparin   60mg  in the fatty abdominal tissue at least 2 inches from the belly button twice a day about 12 hours apart, 8am and 8pm rotate sites. No warfarin.  10/6: Inject enoxaparin  in the fatty tissue every 12 hours, 8am and 8pm. No warfarin.  10/7: Inject enoxaparin  in the fatty tissue every 12 hours, 8am and 8pm. No warfarin.  10/8: Inject enoxaparin  in the fatty tissue in the morning at 8 am (No PM dose). No warfarin.  10/9: Procedure Day - No enoxaparin  - Resume warfarin in the evening or as directed by doctor (take an extra half tablet with usual dose for 2 days then resume normal dose).  10/10: Resume enoxaparin  inject in the fatty tissue every 12 hours and take warfarin  10/11: Inject enoxaparin  in the fatty tissue every 12 hours and take warfarin  10/12: Inject enoxaparin  in the fatty tissue every 12 hours and take warfarin  10/13: Inject enoxaparin  in the fatty tissue every 12 hours and take warfarin  10/14: Inject enoxaparin  in the fatty tissue every 12 hours and take warfarin  10/15: warfarin appt to check INR.

## 2024-08-25 NOTE — Progress Notes (Signed)
 INR 3.1 Please see anticoagulation encounter continue taking warfarin 1.5 tablets daily except for 1 tablet on Wednesday. Eat greens tonight. Recheck INR in 2 weeks. Coumadin  Clinic [phone]; Procedure Fax Number [phone] or [phone] Epidural Injection 10/9 Holding Coumadin  10/4-8

## 2024-08-30 DIAGNOSIS — G8929 Other chronic pain: Secondary | ICD-10-CM | POA: Diagnosis not present

## 2024-08-30 DIAGNOSIS — M25511 Pain in right shoulder: Secondary | ICD-10-CM | POA: Diagnosis not present

## 2024-08-30 DIAGNOSIS — M25631 Stiffness of right wrist, not elsewhere classified: Secondary | ICD-10-CM | POA: Diagnosis not present

## 2024-08-30 DIAGNOSIS — G5630 Lesion of radial nerve, unspecified upper limb: Secondary | ICD-10-CM | POA: Insufficient documentation

## 2024-08-30 NOTE — Discharge Instructions (Addendum)
 Post Procedure Spinal Discharge Instruction Sheet  You may resume a regular diet and any medications that you routinely take (including pain medications) unless otherwise noted by MD.    No driving day of procedure.  Light activity throughout the rest of the day.  Do not do any strenuous work, exercise, bending or lifting.  The day following the procedure, you can resume normal physical activity but you should refrain from exercising or physical therapy for at least three days thereafter.  You may apply ice to the injection site, 20 minutes on, 20 minutes off, as needed. Do not apply ice directly to skin.    Common Side Effects:  Headaches- take your usual medications as directed by your physician.  Increase your fluid intake.  Caffeinated beverages may be helpful.  Lie flat in bed until your headache resolves.  Restlessness or inability to sleep- you may have trouble sleeping for the next few days.  Ask your referring physician if you need any medication for sleep.  Facial flushing or redness- should subside within a few days.  Increased pain- a temporary increase in pain a day or two following your procedure is not unusual.  Take your pain medication as prescribed by your referring physician.  Leg cramps  Please contact our office at [phone] for the following symptoms: Fever greater than 100 degrees. Headaches unresolved with medication after 2-3 days. Increased swelling, pain, or redness at injection site.   Thank you for visiting [facility] today!   You May Restart Your Warfarin in 24 Hours

## 2024-08-31 ENCOUNTER — Ambulatory Visit: Admitting: Family Medicine

## 2024-09-01 ENCOUNTER — Ambulatory Visit
Admission: RE | Admit: 2024-09-01 | Discharge: 2024-09-01 | Disposition: A | Discharge status: Home / Self Care | Source: Ambulatory Visit | Attending: Family Medicine | Admitting: Family Medicine

## 2024-09-01 ENCOUNTER — Ambulatory Visit: Attending: Cardiovascular Disease

## 2024-09-01 DIAGNOSIS — Z7901 Long term (current) use of anticoagulants: Secondary | ICD-10-CM

## 2024-09-01 DIAGNOSIS — Z9889 Other specified postprocedural states: Secondary | ICD-10-CM | POA: Diagnosis not present

## 2024-09-01 DIAGNOSIS — M48062 Spinal stenosis, lumbar region with neurogenic claudication: Secondary | ICD-10-CM

## 2024-09-01 DIAGNOSIS — I340 Nonrheumatic mitral (valve) insufficiency: Secondary | ICD-10-CM

## 2024-09-01 DIAGNOSIS — M5416 Radiculopathy, lumbar region: Secondary | ICD-10-CM | POA: Diagnosis not present

## 2024-09-01 LAB — POCT INR: INR: 1.1 — AB (ref 2.0–3.0)

## 2024-09-01 MED ORDER — METHYLPREDNISOLONE ACETATE 40 MG/ML INJ SUSP (RADIOLOG
80.0000 mg | Freq: Once | INTRAMUSCULAR | Status: AC
  Administered 2024-09-01: 80 mg via EPIDURAL

## 2024-09-01 MED ORDER — IOPAMIDOL (ISOVUE-M 200) INJECTION 41%
1.0000 mL | Freq: Once | INTRAMUSCULAR | Status: AC
  Administered 2024-09-01: 1 mL via EPIDURAL

## 2024-09-01 NOTE — Progress Notes (Signed)
 INR 1.1 Please see anticoagulation encounter FOLLOW LOVENOX  INSTRUCTIONS: continue taking warfarin 1.5 tablets daily except for 1 tablet on Wednesday. Eat greens tonight. Recheck INR in 2 weeks. Coumadin  Clinic [phone]; Procedure Fax Number [phone] or [phone] Epidural Injection 10/9 Holding Coumadin  10/4-8

## 2024-09-02 DIAGNOSIS — M75121 Complete rotator cuff tear or rupture of right shoulder, not specified as traumatic: Secondary | ICD-10-CM | POA: Diagnosis not present

## 2024-09-02 DIAGNOSIS — S43006A Unspecified dislocation of unspecified shoulder joint, initial encounter: Secondary | ICD-10-CM | POA: Insufficient documentation

## 2024-09-02 DIAGNOSIS — M75120 Complete rotator cuff tear or rupture of unspecified shoulder, not specified as traumatic: Secondary | ICD-10-CM | POA: Insufficient documentation

## 2024-09-02 DIAGNOSIS — M25611 Stiffness of right shoulder, not elsewhere classified: Secondary | ICD-10-CM | POA: Insufficient documentation

## 2024-09-02 DIAGNOSIS — M6281 Muscle weakness (generalized): Secondary | ICD-10-CM | POA: Insufficient documentation

## 2024-09-02 DIAGNOSIS — S43004D Unspecified dislocation of right shoulder joint, subsequent encounter: Secondary | ICD-10-CM | POA: Diagnosis not present

## 2024-09-05 ENCOUNTER — Ambulatory Visit

## 2024-09-07 ENCOUNTER — Ambulatory Visit: Attending: Cardiovascular Disease | Admitting: Pharmacist

## 2024-09-07 ENCOUNTER — Other Ambulatory Visit (HOSPITAL_COMMUNITY): Payer: Self-pay

## 2024-09-07 DIAGNOSIS — Z9889 Other specified postprocedural states: Secondary | ICD-10-CM

## 2024-09-07 DIAGNOSIS — Z7901 Long term (current) use of anticoagulants: Secondary | ICD-10-CM

## 2024-09-07 DIAGNOSIS — I340 Nonrheumatic mitral (valve) insufficiency: Secondary | ICD-10-CM | POA: Diagnosis not present

## 2024-09-07 LAB — POCT INR: INR: 1.4 — AB (ref 2.0–3.0)

## 2024-09-07 MED ORDER — WARFARIN SODIUM 2 MG PO TABS
2.0000 mg | ORAL_TABLET | Freq: Every day | ORAL | 1 refills | Status: AC
  Filled 2024-09-07: qty 120, 90d supply, fill #0
  Filled 2024-12-11 – 2024-12-26 (×3): qty 120, 90d supply, fill #1

## 2024-09-07 NOTE — Patient Instructions (Signed)
 Description   INR 1.4: Take 2 tablets today and tomorrow then continue taking warfarin 1.5 tablets daily except for 1 tablet on Wednesday.  Recheck INR in 2 weeks. Coumadin  Clinic [phone]; Procedure Fax Number [phone] or [phone]

## 2024-09-07 NOTE — Progress Notes (Signed)
 Description   INR 1.4: Take 2 tablets today and tomorrow then continue taking warfarin 1.5 tablets daily except for 1 tablet on Wednesday.  Recheck INR in 2 weeks. Coumadin  Clinic [phone]; Procedure Fax Number [phone] or [phone]

## 2024-09-08 ENCOUNTER — Ambulatory Visit

## 2024-09-08 DIAGNOSIS — G5630 Lesion of radial nerve, unspecified upper limb: Secondary | ICD-10-CM | POA: Diagnosis not present

## 2024-09-08 DIAGNOSIS — M816 Localized osteoporosis [Lequesne]: Secondary | ICD-10-CM | POA: Diagnosis not present

## 2024-09-08 DIAGNOSIS — R29898 Other symptoms and signs involving the musculoskeletal system: Secondary | ICD-10-CM | POA: Diagnosis not present

## 2024-09-08 NOTE — Progress Notes (Signed)
Per orders of Dr. Jordan, injection of Prolia given by Taydon Nasworthy R Trevis Eden. Patient tolerated injection well.  

## 2024-09-09 DIAGNOSIS — R29898 Other symptoms and signs involving the musculoskeletal system: Secondary | ICD-10-CM | POA: Insufficient documentation

## 2024-09-10 ENCOUNTER — Other Ambulatory Visit: Payer: Self-pay | Admitting: Neurology

## 2024-09-13 ENCOUNTER — Other Ambulatory Visit (HOSPITAL_COMMUNITY): Payer: Self-pay

## 2024-09-13 DIAGNOSIS — M25511 Pain in right shoulder: Secondary | ICD-10-CM | POA: Insufficient documentation

## 2024-09-13 MED ORDER — GABAPENTIN 100 MG PO CAPS
ORAL_CAPSULE | ORAL | 5 refills | Status: AC
  Filled 2024-09-14: qty 90, 30d supply, fill #0
  Filled 2024-10-13: qty 90, 30d supply, fill #1
  Filled 2024-11-14: qty 90, 30d supply, fill #2
  Filled 2024-12-11: qty 90, 30d supply, fill #3
  Filled 2024-12-23 – 2024-12-26 (×2): qty 90, 30d supply, fill #4

## 2024-09-14 ENCOUNTER — Other Ambulatory Visit: Payer: Self-pay | Admitting: Orthopedic Surgery

## 2024-09-14 ENCOUNTER — Other Ambulatory Visit (HOSPITAL_COMMUNITY): Payer: Self-pay

## 2024-09-14 DIAGNOSIS — Z8739 Personal history of other diseases of the musculoskeletal system and connective tissue: Secondary | ICD-10-CM | POA: Diagnosis not present

## 2024-09-14 DIAGNOSIS — M25511 Pain in right shoulder: Secondary | ICD-10-CM | POA: Diagnosis not present

## 2024-09-14 DIAGNOSIS — G5630 Lesion of radial nerve, unspecified upper limb: Secondary | ICD-10-CM | POA: Diagnosis not present

## 2024-09-14 DIAGNOSIS — M25512 Pain in left shoulder: Secondary | ICD-10-CM | POA: Insufficient documentation

## 2024-09-15 ENCOUNTER — Other Ambulatory Visit: Payer: Self-pay

## 2024-09-15 ENCOUNTER — Other Ambulatory Visit (HOSPITAL_COMMUNITY): Payer: Self-pay

## 2024-09-21 ENCOUNTER — Ambulatory Visit: Attending: Cardiovascular Disease | Admitting: *Deleted

## 2024-09-21 DIAGNOSIS — Z7901 Long term (current) use of anticoagulants: Secondary | ICD-10-CM

## 2024-09-21 DIAGNOSIS — I340 Nonrheumatic mitral (valve) insufficiency: Secondary | ICD-10-CM | POA: Diagnosis not present

## 2024-09-21 DIAGNOSIS — Z9889 Other specified postprocedural states: Secondary | ICD-10-CM

## 2024-09-21 LAB — POCT INR: INR: 2.7 (ref 2.0–3.0)

## 2024-09-21 NOTE — Progress Notes (Signed)
 Description   INR-2.7; Continue taking warfarin 1.5 tablets daily except for 1 tablet on Wednesday.  Recheck INR in 2 weeks. Coumadin  Clinic [phone]; PLEASE HAVE THE SURGEON OFFICE FAX OVER CLEARANCE PAPERWORK- Procedure Fax Number  [phone]

## 2024-09-21 NOTE — Patient Instructions (Signed)
 Description   INR-2.7; Continue taking warfarin 1.5 tablets daily except for 1 tablet on Wednesday.  Recheck INR in 2 weeks. Coumadin  Clinic [phone]; PLEASE HAVE THE SURGEON OFFICE FAX OVER CLEARANCE PAPERWORK- Procedure Fax Number  [phone]

## 2024-09-27 ENCOUNTER — Telehealth (HOSPITAL_BASED_OUTPATIENT_CLINIC_OR_DEPARTMENT_OTHER): Payer: Self-pay | Admitting: *Deleted

## 2024-09-27 NOTE — Telephone Encounter (Signed)
   Pre-operative Risk Assessment    [name]  DOB: May 07, 1946 MRN: [id number]   Date of last office visit: 11/16/23 DR. BERRY Date of next office visit: NONE   Request for Surgical Clearance    Procedure:  RIGHT REVERSE TOTAL SHOULDER ARTHROPLASTY  Date of Surgery:  Clearance 10/13/24                                Surgeon:  DR. JUSTIN CHANDLER Surgeon's Group or Practice Name:  [facility] Phone number:  [phone] PAULA JORGENSEN Fax number:  [phone]   Type of Clearance Requested:   - Medical  - Pharmacy:  Hold Aspirin  and Warfarin (Coumadin )     Type of Anesthesia:  CHOICE   Additional requests/questions:    [name]   09/27/2024, 5:32 PM

## 2024-09-28 ENCOUNTER — Encounter: Payer: Self-pay | Admitting: Family Medicine

## 2024-09-28 ENCOUNTER — Ambulatory Visit: Payer: Self-pay | Admitting: Family Medicine

## 2024-09-28 ENCOUNTER — Ambulatory Visit (INDEPENDENT_AMBULATORY_CARE_PROVIDER_SITE_OTHER): Admitting: Family Medicine

## 2024-09-28 VITALS — BP 138/80 | HR 84 | Temp 97.9°F | Resp 16 | Ht 61.0 in | Wt 130.8 lb

## 2024-09-28 DIAGNOSIS — R197 Diarrhea, unspecified: Secondary | ICD-10-CM | POA: Diagnosis not present

## 2024-09-28 DIAGNOSIS — H8112 Benign paroxysmal vertigo, left ear: Secondary | ICD-10-CM | POA: Diagnosis not present

## 2024-09-28 NOTE — Patient Instructions (Addendum)
 A few things to remember from today's visit:  Benign paroxysmal positional vertigo of left ear  Diarrhea, unspecified type - Plan: Basic metabolic panel with GFR, CBC  Tome liquidos. Si la diarrhea es persistente podemos considerar examen de la materia fecal.  If you need refills for medications you take chronically, please call your pharmacy. Do not use My Chart to request refills or for acute issues that need immediate attention. If you send a my chart message, it may take a few days to be addressed, specially if I am not in the office.  Please be sure medication list is accurate. If a new problem present, please set up appointment sooner than planned today.

## 2024-09-28 NOTE — Progress Notes (Unsigned)
 ACUTE VISIT Chief Complaint  Patient presents with   Acute Visit    Headache, dizziness onset this morning when leaning over    HPI: [name] [name] is a 78 y.o. female, who is here today complaining of *** HPI Discussed the use of AI scribe software for clinical note transcription with the patient, who gave verbal consent to proceed.  History of Present Illness [name] [name] is a 78 year old female who presents with dizziness and vertigo.  She describes experiencing dizziness for the first time, characterized by a loss of control and a sensation of everything spinning rapidly. This episode occurred while she was in the kitchen and bent down to pick something up, leading to a fall without any injuries. The dizziness lasted for several minutes. She describes her head as feeling 'abombada' and reports discomfort in the whole cranium, but denies nausea, vomiting, or changes in vision or hearing. She recalls a similar sensation of losing control and feeling heavy-headed about two months ago when moving from the front to the back seat of a car, but without the spinning sensation. No recent changes in medication or diet.  She reports having diarrhea six to seven times in the morning, which she describes as a relatively new occurrence, though she occasionally experiences it. No nausea, vomiting, or abdominal pain associated with the diarrhea.  Her past medical history includes well-defined sciatica for which she underwent an MRI and received an epidural injection that was ineffective. She is on Coumadin  and had to undergo bridging therapy with injections prior to the epidural. She also has a history of a dislocated shoulder that occurred in June of this year, which required multiple reductions and has resulted in limited mobility and strength in her arm. She is scheduled for shoulder surgery on the 20th of this month.  Lab Results  Component Value Date   NA 142 12/02/2023   CL  106 12/02/2023   K 4.2 12/02/2023   CO2 22 12/02/2023   BUN 18 12/02/2023   CREATININE 0.79 12/02/2023   EGFR 77 12/02/2023   CALCIUM  9.4 12/02/2023   PHOS 1.7 (L) 05/21/2021   ALBUMIN  4.2 12/02/2023   GLUCOSE 114 (H) 12/02/2023   Lab Results  Component Value Date   WBC 8.3 01/22/2023   HGB 14.5 01/22/2023   HCT 42.9 01/22/2023   MCV 98.4 01/22/2023   PLT 220.0 01/22/2023    Review of Systems See other pertinent positives and negatives in HPI.  Current Outpatient Medications on File Prior to Visit  Medication Sig Dispense Refill   acetaminophen  (TYLENOL ) 650 MG CR tablet Take 650 mg by mouth every 8 (eight) hours as needed for pain (Headache).     amLODipine  (NORVASC ) 5 MG tablet Take 1 tablet (5 mg total) by mouth daily. 90 tablet 3   aspirin  EC 81 MG EC tablet Take 1 tablet (81 mg total) by mouth daily. Swallow whole. 30 tablet 11   atorvastatin  (LIPITOR) 20 MG tablet Take 1 tablet (20 mg total) by mouth daily. 90 tablet 1   enoxaparin  (LOVENOX ) 60 MG/0.6ML injection Inject 0.6 mLs (60 mg total) into the skin every 12 (twelve) hours. 12 mL 1   gabapentin  (NEURONTIN ) 100 MG capsule Take 1 capsule (100 mg total) by mouth in the morning AND 2 capsules (200 mg total) every evening. 90 capsule 5   metoprolol  succinate (TOPROL -XL) 25 MG 24 hr tablet Take 1 tablet (25 mg total) by mouth 2 (two) times daily. 180 tablet  3   Polyethyl Glycol-Propyl Glycol (SYSTANE) 0.4-0.3 % SOLN Place 1 drop into both eyes 2 (two) times daily.     warfarin (COUMADIN ) 2 MG tablet Take 1-1&1/2 tablets (2-3 mg total) by mouth daily or as directed by Anticoagulation Clinic. (Patient taking differently: Take 2-3 mg by mouth See admin instructions. Take 3 mg by mouth daily, except on Wednesdays take 2 mg) 120 tablet 1   benzonatate  (TESSALON ) 100 MG capsule Take 1 capsule (100 mg total) by mouth every 8 (eight) hours. (Patient not taking: Reported on 09/28/2024) 21 capsule 0   gabapentin  (NEURONTIN ) 100 MG  capsule Take 2 capsules (200 mg total) by mouth 3 (three) times daily. (Patient not taking: Reported on 09/28/2024) 180 capsule 3   metoprolol  succinate (TOPROL -XL) 25 MG 24 hr tablet TAKE ONE TABLET BY MOUTH EVERY MORNING & TAKE ONE TABLET BY MOUTH EVERY EVENING (Patient not taking: Reported on 09/28/2024) 180 tablet 2   Peppermint Oil (IBGARD) 90 MG CPCR Use as needed (Patient not taking: Reported on 09/28/2024)     traMADol  (ULTRAM ) 50 MG tablet Take 1 tablet (50 mg total) by mouth 3 (three) times daily as needed FOR PAIN. (Patient taking differently: Take 25-50 mg by mouth 3 (three) times daily as needed.) 15 tablet 0   No current facility-administered medications on file prior to visit.    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   Dr Waylon, Veverly Persons, Dr Skeet     Abnormal CT scan of lung 02/06/2021   Acute bursitis of right shoulder 07/22/2022   Injection given July 22, 2022     Acute post-traumatic headache, not intractable 11/15/2019   Allergic rhinitis    Atypical chest pain    Bilateral carpal tunnel syndrome 07/17/2017   EMG, Modena Callander, M.D  06/2017     Bilateral primary osteoarthritis of knee 10/03/2013   Bilateral injections given May 09, 2020     Bilateral shoulder pain 12/11/2017   Binocular vision disorder with diplopia 07/29/2021   Carbuncle of groin 02/25/2019   Cardiomegaly 02/05/2021   Cervicalgia 10/28/2022   Cervicogenic headache 06/03/2018   Chest pain 02/05/2021   Chronic midline low back pain without sciatica 09/15/2016   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   Degenerative spondylolisthesis 05/06/2022   Diverticulosis    Dupuytren's contracture of hand 07/20/2017   Epigastric pain 12/26/2010   Essential hypertension 09/27/2010   Gastritis 11/08/2007, 04/12/2012   H pylori bx neg 03/2012   Generalized osteoarthritis of multiple sites 06/30/2017   GERD (gastroesophageal reflux disease) 11/08/2007   Heart  failure 02/21/2021   Hepatic cyst    Hiatal hernia    11/08/2007, 04/12/2012   Hyperlipidemia    IBS (irritable bowel syndrome)    diarrhea predom   Joint pain 06/03/2016   Lateral meniscal tear 10/03/2013   Degenerative seen on ultrasound October 03, 2013     Left lower quadrant abdominal pain 05/01/2020   Leg cramps 06/10/2017   Long term (current) use of anticoagulants 05/29/2021   Migraine headache 09/27/2010   Mild cognitive impairment 08/18/2023   Mitral regurgitation    Murmur 02/21/2021   Osteopenia 07/20/2016   dexa 01/2019: Spine -2.2, RFN -2.2, LFN -2.0, decrease since prior scan.. frax -12.5%, 3%-we will look into Prolia -if not covered then Reclast  DEXA from [facility]-osteoporosis  Prior dexa scans have showed osteoporosis     Osteoporosis    DEXA 01/21/2012: -2.1 spine and R fem, -  1.8 L fem s/p fosamax  2004-2011 DEXA 04/18/14 @ [facility]: -2.5 - rec to start Prolia      Paroxysmal atrial fibrillation 06/19/2021   Patellofemoral pain syndrome 10/03/2013   Personal history of urinary calculi 09/27/2010   Prediabetes 05/02/2019   PVC's (premature ventricular contractions)    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   Complex valvuloplasty including PTFE neochord placement x6 with 30 mm Medtronic Simuform ring annuloplasty with clipping of LA appendage   Schatzki's ring 11/11/2010   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   Shingles outbreak 04/2013   Skin tag 10/28/2022   Spinal stenosis, lumbar region, 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  Repeat injection given January 22, 2023     Trigger thumb of right hand 01/22/2023   Injection given January 22, 2023 repeat at the A1 pulley Apr 02, 2023     Varicose vein of leg    No Known Allergies  Social History    Socioeconomic History   Marital status: Married    Spouse name: Erla   Number of children: 3   Years of education: 18   Highest education level: Master's degree (e.g., MA, MS, MEng, MEd, MSW, MBA)  Occupational History   Occupation: Retired  Tobacco Use   Smoking status: Never   Smokeless tobacco: Never  Vaping Use   Vaping status: Never Used  Substance and Sexual Activity   Alcohol  use: No   Drug use: No   Sexual activity: Not on file  Other Topics Concern   Not on file  Social History Narrative   Married, lives with spouse. Retired comptroller, manufacturing systems engineer. Has 3 kids (moved to [place] to be closer)- youngest son MD. [place] to [place] from [place]  06/2010, lived in [place] x 10years   Social Drivers of Health   Financial Resource Strain: Low Risk  (05/15/2023)   Overall Financial Resource Strain (CARDIA)    Difficulty of Paying Living Expenses: Not hard at all  Food Insecurity: No Food Insecurity (05/15/2023)   Hunger Vital Sign    Worried About Running Out of Food in the Last Year: Never true    Ran Out of Food in the Last Year: Never true  Transportation Needs: No Transportation Needs (05/15/2023)   PRAPARE - Administrator, Civil Service (Medical): No    Lack of Transportation (Non-Medical): No  Physical Activity: Insufficiently Active (05/15/2023)   Exercise Vital Sign    Days of Exercise per Week: 3 days    Minutes of Exercise per Session: 30 min  Stress: No Stress Concern Present (05/15/2023)   [employer] of Occupational Health - Occupational Stress Questionnaire    Feeling of Stress : Not at all  Social Connections: Socially Integrated (05/15/2023)   Social Connection and Isolation Panel    Frequency of Communication with Friends and Family: More than three times a week    Frequency of Social Gatherings with Friends and Family: More than three times a week    Attends Religious Services: More than 4 times per year    Active Member of Clubs or  Organizations: Yes    Attends Banker Meetings: More than 4 times per year    Marital Status: Married    Vitals:   09/28/24 1511 09/28/24 1512  BP: (!) 140/79 138/80  Pulse:  77 84  Resp: 16   Temp: 97.9 F (36.6 C)   SpO2: 97%    Body mass index is 24.71 kg/m.  Physical Exam Vitals and nursing note reviewed.  Constitutional:      General: She is not in acute distress.    Appearance: She is well-developed.  HENT:     Head: Normocephalic and atraumatic.     Right Ear: Tympanic membrane, ear canal and external ear normal.     Left Ear: Tympanic membrane, ear canal and external ear normal.     Ears:     Comments: Dix-Hallpike maneuver negative. Left-sided Apley maneuver elicited spinning sensation with associated nystagmus.    Mouth/Throat:     Mouth: Mucous membranes are moist.     Pharynx: Oropharynx is clear.  Eyes:     Conjunctiva/sclera: Conjunctivae normal.  Neck:     Vascular: No carotid bruit.  Cardiovascular:     Rate and Rhythm: Normal rate and regular rhythm.     Heart sounds: No murmur heard. Pulmonary:     Effort: Pulmonary effort is normal. No respiratory distress.     Breath sounds: Normal breath sounds.  Abdominal:     Palpations: Abdomen is soft. There is no mass.     Tenderness: There is no abdominal tenderness.  Musculoskeletal:     Right shoulder: Decreased range of motion.     Cervical back: Decreased range of motion.     Right lower leg: No edema.     Left lower leg: No edema.  Lymphadenopathy:     Cervical: No cervical adenopathy.  Skin:    General: Skin is warm.     Findings: No erythema or rash.  Neurological:     General: No focal deficit present.     Mental Status: She is alert and oriented to person, place, and time.     Deep Tendon Reflexes:     Reflex Scores:      Patellar reflexes are 2+ on the right side and 2+ on the left side.    Comments: Stable gait, not assisted.  Psychiatric:        Mood and Affect: Mood and  affect normal.    ASSESSMENT AND PLAN:  Benign paroxysmal positional vertigo of left ear  Diarrhea, unspecified type -     Basic metabolic panel with GFR; Future -     CBC; Future   We discussed possible etiologies, history and examination today suggest benign vertigo. Reviewed treatment options, including vestibular therapy and meclizine as well as side effects of medication.  She prefers to hold on brain imaging, vestibular therapy, and medication for now. Fall precaution discussed. Clearly instructed about warning signs. Further recommendations will be given according to lab results. *** She has had intermittent episodes for a while, IBS. Continue adequate hydration. Monitor for new symptoms.  Return if symptoms worsen or fail to improve, for keep next appointment.  Anzlee Hinesley G. Teejay Meader, MD  [facility]. [place] office.  Discharge Instructions   None

## 2024-09-28 NOTE — Telephone Encounter (Signed)
 FYI Only or Action Required?: FYI only for provider: appointment scheduled on 09/28/2024.  Patient was last seen in primary care on 03/02/2024 by Jordan, Betty G, MD.  Called Nurse Triage reporting Dizziness.  Symptoms began today.  Triage Disposition: See Physician Within 4 Hours (or PCP Triage) (overriding See Physician Within 24 Hours)  Patient/caregiver understands and will follow disposition?: Yes        Copied from CRM [id number]. Topic: Clinical - Red Word Triage >> Sep 28, 2024  9:17 AM Deaijah H wrote: Red Word that prompted transfer to Nurse Triage: Dizziness/Diarrhea Reason for Disposition  [1] MODERATE dizziness (e.g., interferes with normal activities) AND [2] has NOT been evaluated by doctor (or NP/PA) for this  (Exception: Dizziness caused by heat exposure, sudden standing, or poor fluid intake.)  Answer Assessment - Initial Assessment Questions Pt states this morning she started feeling dizzy Diarrhea started this morning (x5-6) Fatigue Pt states she can stand and walk Denies difficulty breathing, chest pain  Protocols used: Dizziness - Lightheadedness-A-AH

## 2024-09-28 NOTE — Telephone Encounter (Signed)
 Pharmacy please advise on holding warfarin prior to RIGHT REVERSE TOTAL SHOULDER ARTHROPLASTY scheduled for 10/13/2024. Last labs on 04/25/2024. Thank you.

## 2024-09-29 ENCOUNTER — Ambulatory Visit: Payer: Self-pay | Admitting: Family Medicine

## 2024-09-29 LAB — CBC
HCT: 42.8 % (ref 36.0–46.0)
Hemoglobin: 14.2 g/dL (ref 12.0–15.0)
MCHC: 33.3 g/dL (ref 30.0–36.0)
MCV: 100.7 fl — ABNORMAL HIGH (ref 78.0–100.0)
Platelets: 205 K/uL (ref 150.0–400.0)
RBC: 4.25 Mil/uL (ref 3.87–5.11)
RDW: 14.5 % (ref 11.5–15.5)
WBC: 7.5 K/uL (ref 4.0–10.5)

## 2024-09-29 LAB — BASIC METABOLIC PANEL WITH GFR
BUN: 25 mg/dL — ABNORMAL HIGH (ref 6–23)
CO2: 27 meq/L (ref 19–32)
Calcium: 9.3 mg/dL (ref 8.4–10.5)
Chloride: 105 meq/L (ref 96–112)
Creatinine, Ser: 0.94 mg/dL (ref 0.40–1.20)
GFR: 57.79 mL/min — ABNORMAL LOW (ref >60.00)
Glucose, Bld: 89 mg/dL (ref 70–99)
Potassium: 4.7 meq/L (ref 3.5–5.1)
Sodium: 140 meq/L (ref 135–145)

## 2024-09-30 NOTE — Telephone Encounter (Signed)
   Name: [name] [name]  DOB: 1946-09-27  MRN: [id number]  Primary Cardiologist: Dorn Lesches, MD   Preoperative team, please contact this patient and set up a phone call appointment for further preoperative risk assessment. Please obtain consent and complete medication review. Thank you for your help. Procedure scheduled for 10/13/2024  I confirm that guidance regarding antiplatelet and oral anticoagulation therapy has been completed and, if necessary, noted below  Pharmacy: 09/30/2024 Per office protocol, patient can hold warfarin for 5 days prior to procedure.   I also confirmed the patient resides in the state of  . As per [facility] Medical Board telemedicine laws, the patient must reside in the state in which the provider is licensed.   Lamarr Satterfield, NP 09/30/2024, 11:19 AM [facility]

## 2024-09-30 NOTE — Telephone Encounter (Signed)
 Left message to call back to schedule tele pre op appt.

## 2024-09-30 NOTE — Patient Instructions (Signed)
 SURGICAL WAITING ROOM VISITATION Patients having surgery or a procedure may have no more than 2 support people in the waiting area - these visitors may rotate in the visitor waiting room.   Due to an increase in RSV and influenza rates and associated hospitalizations, children ages 79 and under may not visit patients in [facility] hospitals. If the patient needs to stay at the hospital during part of their recovery, the visitor guidelines for inpatient rooms apply.  PRE-OP VISITATION  Pre-op nurse will coordinate an appropriate time for 1 support person to accompany the patient in pre-op.  This support person may not rotate.  This visitor will be contacted when the time is appropriate for the visitor to come back in the pre-op area.  Please refer to the [facility] website for the visitor guidelines for Inpatients (after your surgery is over and you are in a regular room).  You are not required to quarantine at this time prior to your surgery. However, you must do this: Hand Hygiene often Do NOT share personal items Notify your provider if you are in close contact with someone who has COVID or you develop fever 100.4 or greater, new onset of sneezing, cough, sore throat, shortness of breath or body aches.  If you test positive for Covid or have been in contact with anyone that has tested positive in the last 10 days please notify you surgeon.    Your procedure is scheduled on:  10/13/24  Report to [facility] Main Entrance: [place] entrance where the [employer] is available.   Report to admitting at: 7:00 AM  Call this number if you have any questions or problems the morning of surgery [phone]  FOLLOW ANY ADDITIONAL PRE OP INSTRUCTIONS YOU RECEIVED FROM YOUR SURGEON'S OFFICE!!!  Do not eat food after Midnight the night prior to your surgery/procedure.  After Midnight you may have the following liquids until: 6:30 AM DAY OF SURGERY  Clear Liquid Diet Water Black  Coffee (sugar ok, NO MILK/CREAM OR CREAMERS)  Tea (sugar ok, NO MILK/CREAM OR CREAMERS) regular and decaf                             Plain Jell-O  with no fruit (NO RED)                                           Fruit ices (not with fruit pulp, NO RED)                                     Popsicles (NO RED)                                                                  Juice: NO CITRUS JUICES: only apple, WHITE grape, WHITE cranberry Sports drinks like Gatorade or Powerade (NO RED)   The day of surgery:  Drink ONE (1) Pre-Surgery Clear Ensure at: 6:30 AM the morning of surgery. Drink in one sitting. Do not sip.  This drink was given to  you during your hospital pre-op appointment visit. Nothing else to drink after completing the Pre-Surgery Clear Ensure or G2 : No candy, chewing gum or throat lozenges.    Oral Hygiene is also important to reduce your risk of infection.        Remember - BRUSH YOUR TEETH THE MORNING OF SURGERY WITH YOUR REGULAR TOOTHPASTE  Do NOT smoke after Midnight the night before surgery.  STOP TAKING all Vitamins, Herbs and supplements 1 week before your surgery.   Take ONLY these medicines the morning of surgery with A SIP OF WATER: gabapentin ,metoprolol ,amlodipine .    Before surgery.Stop taking ___________on __________as instructed by _____________.  Stop taking ____________as directed by your Surgeon/Cardiologist.  Contact your Surgeon/Cardiologist for instructions on Anticoagulant Therapy prior to surgery.                   You may not have any metal on your body including hair pins, jewelry, and body piercing  Do not wear make-up, lotions, powders, perfumes / cologne, or deodorant  Do not wear nail polish including gel and S&S, artificial / acrylic nails, or any other type of covering on natural nails including finger and toenails. If you have artificial nails, gel coating, etc., that needs to be removed by a nail salon, Please have this removed prior to  surgery. Not doing so may mean that your surgery could be cancelled or delayed if the Surgeon or anesthesia staff feels like they are unable to monitor you safely.   Do not shave 48 hours prior to surgery to avoid nicks in your skin which may contribute to postoperative infections.   Contacts, Hearing Aids, dentures or bridgework may not be worn into surgery. DENTURES WILL BE REMOVED PRIOR TO SURGERY PLEASE DO NOT APPLY Poly grip OR ADHESIVES!!!  You may bring a small overnight bag with you on the day of surgery, only pack items that are not valuable. [facility] IS NOT RESPONSIBLE   FOR VALUABLES THAT ARE LOST OR STOLEN.   Patients discharged on the day of surgery will not be allowed to drive home.  Someone NEEDS to stay with you for the first 24 hours after anesthesia.  Do not bring your home medications to the hospital. The Pharmacy will dispense medications listed on your medication list to you during your admission in the Hospital.  Special Instructions: Bring a copy of your healthcare power of attorney and living will documents the day of surgery, if you wish to have them scanned into your [place] Medical Records- EPIC  Please read over the following fact sheets you were given: IF YOU HAVE QUESTIONS ABOUT YOUR PRE-OP INSTRUCTIONS, PLEASE CALL [phone]   [facility] - Preparing for Surgery      Before surgery, you can play an important role.  Because skin is not sterile, your skin needs to be as free of germs as possible.  You can reduce the number of germs on your skin by washing with CHG (chlorahexidine gluconate) soap before surgery.  CHG is an antiseptic cleaner which kills germs and bonds with the skin to continue killing germs even after washing. Please DO NOT use if you have an allergy to CHG or antibacterial soaps.  If your skin becomes reddened/irritated stop using the CHG and inform your nurse when you arrive at Short Stay. Do not shave (including legs and underarms) for  at least 48 hours prior to the first CHG shower.  You may shave your face/neck.  Please follow these instructions  carefully:  1.  Shower with CHG Soap the night before surgery ONLY (DO NOT USE THE SOAP THE MORNING OF SURGERY).  2.  If you choose to wash your hair, wash your hair first as usual with your normal  shampoo.  3.  After you shampoo, rinse your hair and body thoroughly to remove the shampoo.                             4.  Use CHG as you would any other liquid soap.  You can apply chg directly to the skin and wash.  Gently with a scrungie or clean washcloth.  5.  Apply the CHG Soap to your body ONLY FROM THE NECK DOWN.   Do not use on face/ open                           Wound or open sores. Avoid contact with eyes, ears mouth and genitals (private parts).                       Wash face,  Genitals (private parts) with your normal soap.             6.  Wash thoroughly, paying special attention to the area where your  surgery  will be performed.  7.  Thoroughly rinse your body with warm water from the neck down.  8.  DO NOT shower/wash with your normal soap after using and rinsing off the CHG Soap.                9.  Pat yourself dry with a clean towel.            10.  Wear clean pajamas.            11.  Place clean sheets on your bed the night of your first shower and do not  sleep with pets.  Day of Surgery : Do not apply any CHG, lotions/deodorants the morning of surgery.  Please wear clean clothes to the hospital/surgery center.   FAILURE TO FOLLOW THESE INSTRUCTIONS MAY RESULT IN THE CANCELLATION OF YOUR SURGERY  PATIENT SIGNATURE_________________________________  NURSE SIGNATURE__________________________________  ________________________________________________________________________[facility]- Preparing for Total Shoulder Arthroplasty    Before surgery, you can play an important role. Because skin is not sterile, your skin needs to be as free of germs as possible. You  can reduce the number of germs on your skin by using the following products. Benzoyl Peroxide Gel Reduces the number of germs present on the skin Applied twice a day to shoulder area starting two days before surgery    ==================================================================  Please follow these instructions carefully:  BENZOYL PEROXIDE 5% GEL  Please do not use if you have an allergy to benzoyl peroxide.   If your skin becomes reddened/irritated stop using the benzoyl peroxide.  Starting two days before surgery, apply as follows: Apply benzoyl peroxide in the morning and at night. Apply after taking a shower. If you are not taking a shower clean entire shoulder front, back, and side along with the armpit with a clean wet washcloth.  Place a quarter-sized dollop on your shoulder and rub in thoroughly, making sure to cover the front, back, and side of your shoulder, along with the armpit.   2 days before ____ AM   ____ PM  1 day before ____ AM   ____ PM                         Do this twice a day for two days.  (Last application is the night before surgery, AFTER using the CHG soap as described below).  Do NOT apply benzoyl peroxide gel on the day of surgery.  Pre-operative 4 CHG Bath Instructions  DYNA-Hex 4 Chlorhexidine  Gluconate 4% Solution Antiseptic 4 fl. oz   You can play a key role in reducing the risk of infection after surgery. Your skin needs to be as free of germs as possible. You can reduce the number of germs on your skin by washing with CHG (chlorhexidine  gluconate) soap before surgery. CHG is an antiseptic soap that kills germs and continues to kill germs even after washing.   DO NOT use if you have an allergy to chlorhexidine /CHG or antibacterial soaps. If your skin becomes reddened or irritated, stop using the CHG and notify one of our RNs at   Please shower with the CHG soap starting 4 days before surgery using the following schedule:      Please keep in mind the following:  DO NOT shave, including legs and underarms, starting the day of your first shower.   You may shave your face at any point before/day of surgery.  Place clean sheets on your bed the day you start using CHG soap. Use a clean washcloth (not used since being washed) for each shower. DO NOT sleep with pets once you start using the CHG.  CHG Shower Instructions:  If you choose to wash your hair and private area, wash first with your normal shampoo/soap.  After you use shampoo/soap, rinse your hair and body thoroughly to remove shampoo/soap residue.  Turn the water OFF and apply about 3 tablespoons (45 ml) of CHG soap to a CLEAN washcloth.  Apply CHG soap ONLY FROM YOUR NECK DOWN TO YOUR TOES (washing for 3-5 minutes)  DO NOT use CHG soap on face, private areas, open wounds, or sores.  Pay special attention to the area where your surgery is being performed.  If you are having back surgery, having someone wash your back for you may be helpful. Wait 2 minutes after CHG soap is applied, then you may rinse off the CHG soap.  Pat dry with a clean towel  Put on clean clothes/pajamas   If you choose to wear lotion, please use ONLY the CHG-compatible lotions on the back of this paper.     Additional instructions for the day of surgery: DO NOT APPLY any lotions, deodorants, cologne, or perfumes.   Put on clean/comfortable clothes.  Brush your teeth.  Ask your nurse before applying any prescription medications to the skin.   CHG Compatible Lotions   Aveeno Moisturizing lotion  Cetaphil Moisturizing Cream  Cetaphil Moisturizing Lotion  Clairol Herbal Essence Moisturizing Lotion, Dry Skin  Clairol Herbal Essence Moisturizing Lotion, Extra Dry Skin  Clairol Herbal Essence Moisturizing Lotion, Normal Skin  Curel Age Defying Therapeutic Moisturizing Lotion with Alpha Hydroxy  Curel Extreme Care Body Lotion  Curel Soothing Hands Moisturizing Hand Lotion   Curel Therapeutic Moisturizing Cream, Fragrance-Free  Curel Therapeutic Moisturizing Lotion, Fragrance-Free  Curel Therapeutic Moisturizing Lotion, Original Formula  Eucerin Daily Replenishing Lotion  Eucerin Dry Skin Therapy Plus Alpha Hydroxy Crme  Eucerin Dry Skin Therapy Plus Alpha Hydroxy Lotion  Eucerin Original Crme  Eucerin Original Lotion  Eucerin Plus Crme  Eucerin Plus Lotion  Eucerin TriLipid Replenishing Lotion  Keri Anti-Bacterial Hand Lotion  Keri Deep Conditioning Original Lotion Dry Skin Formula Softly Scented  Keri Deep Conditioning Original Lotion, Fragrance Free Sensitive Skin Formula  Keri Lotion Fast Absorbing Fragrance Free Sensitive Skin Formula  Keri Lotion Fast Absorbing Softly Scented Dry Skin Formula  Keri Original Lotion  Keri Skin Renewal Lotion Keri Silky Smooth Lotion  Keri Silky Smooth Sensitive Skin Lotion  Nivea Body Creamy Conditioning Oil  Nivea Body Extra Enriched Lotion  Nivea Body Original Lotion  Nivea Body Sheer Moisturizing Lotion Nivea Crme  Nivea Skin Firming Lotion  NutraDerm 30 Skin Lotion  NutraDerm Skin Lotion  NutraDerm Therapeutic Skin Cream  NutraDerm Therapeutic Skin Lotion  ProShield Protective Hand Cream  Provon moisturizing lotion  Incentive Spirometer  An incentive spirometer is a tool that can help keep your lungs clear and active. This tool measures how well you are filling your lungs with each breath. Taking long deep breaths may help reverse or decrease the chance of developing breathing (pulmonary) problems (especially infection) following: A long period of time when you are unable to move or be active. BEFORE THE PROCEDURE  If the spirometer includes an indicator to show your best effort, your nurse or respiratory therapist will set it to a desired goal. If possible, sit up straight or lean slightly forward. Try not to slouch. Hold the incentive spirometer in an upright position. INSTRUCTIONS FOR USE  Sit on the  edge of your bed if possible, or sit up as far as you can in bed or on a chair. Hold the incentive spirometer in an upright position. Breathe out normally. Place the mouthpiece in your mouth and seal your lips tightly around it. Breathe in slowly and as deeply as possible, raising the piston or the ball toward the top of the column. Hold your breath for 3-5 seconds or for as long as possible. Allow the piston or ball to fall to the bottom of the column. Remove the mouthpiece from your mouth and breathe out normally. Rest for a few seconds and repeat Steps 1 through 7 at least 10 times every 1-2 hours when you are awake. Take your time and take a few normal breaths between deep breaths. The spirometer may include an indicator to show your best effort. Use the indicator as a goal to work toward during each repetition. After each set of 10 deep breaths, practice coughing to be sure your lungs are clear. If you have an incision (the cut made at the time of surgery), support your incision when coughing by placing a pillow or rolled up towels firmly against it. Once you are able to get out of bed, walk around indoors and cough well. You may stop using the incentive spirometer when instructed by your caregiver.  RISKS AND COMPLICATIONS Take your time so you do not get dizzy or light-headed. If you are in pain, you may need to take or ask for pain medication before doing incentive spirometry. It is harder to take a deep breath if you are having pain. AFTER USE Rest and breathe slowly and easily. It can be helpful to keep track of a log of your progress. Your caregiver can provide you with a simple table to help with this. If you are using the spirometer at home, follow these instructions: SEEK MEDICAL CARE IF:  You are having difficultly using the spirometer. You have trouble using the spirometer as often as instructed. Your pain medication is  not giving enough relief while using the spirometer. You  develop fever of 100.5 F (38.1 C) or higher. SEEK IMMEDIATE MEDICAL CARE IF:  You cough up bloody sputum that had not been present before. You develop fever of 102 F (38.9 C) or greater. You develop worsening pain at or near the incision site. MAKE SURE YOU:  Understand these instructions. Will watch your condition. Will get help right away if you are not doing well or get worse. Document Released: 03/23/2007 Document Revised: 02/02/2012 Document Reviewed: 05/24/2007 [facility] Patient Information 2014 [place], [place].   ________________________________________________________________________

## 2024-09-30 NOTE — Telephone Encounter (Addendum)
 Patient with diagnosis of A Fib on warfarin for anticoagulation.    Procedure: RIGHT REVERSE TOTAL SHOULDER ARTHROPLASTY  Date of procedure: 10/13/24   CHA2DS2-VASc Score = 7  This indicates a 11.2% annual risk of stroke. The patient's score is based upon: CHF History: 1 HTN History: 1 Diabetes History: 0 Stroke History: 2 Vascular Disease History: 0 Age Score: 2 Gender Score: 1    CrCl 45 ml/min Platelet count 205K  Patient has not  had an Afib/aflutter ablation in the last 3 months, DCCV within the last 4 weeks or a watchman implanted in the last 45 days   Per office protocol, patient can hold warfarin for 5 days prior to procedure.    Patient WILL need bridging with Lovenox  (enoxaparin ) around procedure. This will be organized by Coumadin  clinic.  **This guidance is not considered finalized until pre-operative APP has relayed final recommendations.**

## 2024-10-03 ENCOUNTER — Ambulatory Visit (HOSPITAL_COMMUNITY)
Admission: RE | Admit: 2024-10-03 | Discharge: 2024-10-03 | Disposition: A | Discharge status: Home / Self Care | Source: Ambulatory Visit | Attending: Orthopedic Surgery | Admitting: Orthopedic Surgery

## 2024-10-03 ENCOUNTER — Encounter (HOSPITAL_COMMUNITY)
Admission: RE | Admit: 2024-10-03 | Discharge: 2024-10-03 | Disposition: A | Source: Ambulatory Visit | Attending: Orthopedic Surgery

## 2024-10-03 ENCOUNTER — Encounter (HOSPITAL_COMMUNITY): Payer: Self-pay

## 2024-10-03 ENCOUNTER — Telehealth: Payer: Self-pay | Admitting: *Deleted

## 2024-10-03 ENCOUNTER — Other Ambulatory Visit: Payer: Self-pay

## 2024-10-03 VITALS — BP 130/77 | HR 71 | Temp 97.5°F | Ht 61.0 in | Wt 128.0 lb

## 2024-10-03 DIAGNOSIS — Z01818 Encounter for other preprocedural examination: Secondary | ICD-10-CM

## 2024-10-03 DIAGNOSIS — Z7901 Long term (current) use of anticoagulants: Secondary | ICD-10-CM | POA: Diagnosis not present

## 2024-10-03 HISTORY — DX: Cardiac arrhythmia, unspecified: I49.9

## 2024-10-03 LAB — SURGICAL PCR SCREEN
MRSA, PCR: NEGATIVE
Staphylococcus aureus: NEGATIVE

## 2024-10-03 NOTE — Telephone Encounter (Signed)
  Patient Consent for Virtual Visit        [name] [name] has provided verbal consent on 10/03/2024 for a virtual visit (video or telephone).   CONSENT FOR VIRTUAL VISIT FOR:  [name] [name]  By participating in this virtual visit I agree to the following:  I hereby voluntarily request, consent and authorize [facility] and its employed or contracted physicians, physician assistants, nurse practitioners or other licensed health care professionals (the Practitioner), to provide me with telemedicine health care services (the "Services) as deemed necessary by the treating Practitioner. I acknowledge and consent to receive the Services by the Practitioner via telemedicine. I understand that the telemedicine visit will involve communicating with the Practitioner through live audiovisual communication technology and the disclosure of certain medical information by electronic transmission. I acknowledge that I have been given the opportunity to request an in-person assessment or other available alternative prior to the telemedicine visit and am voluntarily participating in the telemedicine visit.  I understand that I have the right to withhold or withdraw my consent to the use of telemedicine in the course of my care at any time, without affecting my right to future care or treatment, and that the Practitioner or I may terminate the telemedicine visit at any time. I understand that I have the right to inspect all information obtained and/or recorded in the course of the telemedicine visit and may receive copies of available information for a reasonable fee.  I understand that some of the potential risks of receiving the Services via telemedicine include:  Delay or interruption in medical evaluation due to technological equipment failure or disruption; Information transmitted may not be sufficient (e.g. poor resolution of images) to allow for appropriate medical decision making by  the Practitioner; and/or  In rare instances, security protocols could fail, causing a breach of personal health information.  Furthermore, I acknowledge that it is my responsibility to provide information about my medical history, conditions and care that is complete and accurate to the best of my ability. I acknowledge that Practitioner's advice, recommendations, and/or decision may be based on factors not within their control, such as incomplete or inaccurate data provided by me or distortions of diagnostic images or specimens that may result from electronic transmissions. I understand that the practice of medicine is not an exact science and that Practitioner makes no warranties or guarantees regarding treatment outcomes. I acknowledge that a copy of this consent can be made available to me via my patient portal [facility] MyChart), or I can request a printed copy by calling the office of [facility].    I understand that my insurance will be billed for this visit.   I have read or had this consent read to me. I understand the contents of this consent, which adequately explains the benefits and risks of the Services being provided via telemedicine.  I have been provided ample opportunity to ask questions regarding this consent and the Services and have had my questions answered to my satisfaction. I give my informed consent for the services to be provided through the use of telemedicine in my medical care

## 2024-10-03 NOTE — Telephone Encounter (Signed)
 Spoke with patient and scheduled her for a pre op telehealth appt for 10/11/24 at 2:40 PM. Will route back to requesting surgeons office to make them aware.

## 2024-10-03 NOTE — Progress Notes (Signed)
 For Anesthesia: PCP - Jordan, Betty G, MD LOV: 09/28/24 Cardiologist - Court Dorn PARAS, MD  Clearance: pending. Bowel Prep reminder:N/A  Chest x -ray - 10/03/24 EKG - 11/16/23 Stress Test -  ECHO - 07/23/24 Cardiac Cath - 03/14/21 Pacemaker/ICD device last checked: Pacemaker orders received: Device Rep notified:  Spinal Cord Stimulator:N/A  Sleep Study - N/A CPAP -   Fasting Blood Sugar - N/A Checks Blood Sugar _____ times a day Date and result of last Hgb A1c-  Last dose of GLP1 agonist- N/A GLP1 instructions: Hold 7 days prior to schedule (Hold 24 hours-daily)   Last dose of SGLT-2 inhibitors- N/A SGLT-2 instructions: Hold 72 hours prior to surgery  Blood Thinner Instructions:Pt. Has an appointment on : 10/05/24 at the coumadin  clinic and will get instructions. Last Dose: Time last taken:  Aspirin  Instructions: Last Dose: Time last taken:  Activity level: Can go up a flight of stairs and activities of daily living without stopping and without chest pain and/or shortness of breath   Able to exercise without chest pain and/or shortness of breath  Anesthesia review: Hx: HTN,Afib,PVC's,new diagnose of vertigo.  Patient denies shortness of breath, fever, cough and chest pain at PAT appointment   Patient verbalized understanding of instructions that were reviewed over the telephone.

## 2024-10-05 ENCOUNTER — Other Ambulatory Visit (HOSPITAL_COMMUNITY): Payer: Self-pay

## 2024-10-05 ENCOUNTER — Ambulatory Visit: Attending: Cardiovascular Disease | Admitting: Pharmacist

## 2024-10-05 DIAGNOSIS — Z9889 Other specified postprocedural states: Secondary | ICD-10-CM

## 2024-10-05 DIAGNOSIS — I340 Nonrheumatic mitral (valve) insufficiency: Secondary | ICD-10-CM | POA: Diagnosis not present

## 2024-10-05 DIAGNOSIS — Z7901 Long term (current) use of anticoagulants: Secondary | ICD-10-CM | POA: Diagnosis not present

## 2024-10-05 LAB — POCT INR: INR: 3.3 — AB (ref 2.0–3.0)

## 2024-10-05 NOTE — Patient Instructions (Addendum)
 Description   INR-3.3.  Hold dose today and then ontinue taking warfarin 1.5 tablets daily except for 1 tablet on Wednesday.     Follow Enoxaparin  directions on sheet.  Coumadin  Clinic [phone];  Procedure Fax Number  [phone]      11/14: Last dose of warfarin.  11/15: No warfarin or enoxaparin  (Lovenox ).  11/16: Inject enoxaparin  60mg  in the fatty abdominal tissue at least 2 inches from the belly button twice a day about 12 hours apart, 8am and 8pm rotate sites. No warfarin.  11/17: Inject enoxaparin  in the fatty tissue every 12 hours, 8am and 8pm. No warfarin.  11/18: Inject enoxaparin  in the fatty tissue every 12 hours, 8am and 8pm. No warfarin.  11/19: Inject enoxaparin  in the fatty tissue in the morning at 8 am (No PM dose). No warfarin.  11/20: Procedure Day - No enoxaparin  - Resume warfarin in the evening or as directed by doctor (take an extra half tablet with usual dose for 2 days then resume normal dose).  11/21: Resume enoxaparin  inject in the fatty tissue every 12 hours and take warfarin  11/22: Inject enoxaparin  in the fatty tissue every 12 hours and take warfarin  11/23: Inject enoxaparin  in the fatty tissue every 12 hours and take warfarin  11/24: Inject enoxaparin  in the fatty tissue every 12 hours and take warfarin  11/25: Inject enoxaparin  in the fatty tissue every 12 hours and take warfarin  11/26: Continue warfarin  12/1: Recheck INR

## 2024-10-05 NOTE — Progress Notes (Signed)
 Description   INR-3.3.  Hold dose today and then ontinue taking warfarin 1.5 tablets daily except for 1 tablet on Wednesday.     Follow Enoxaparin  directions on sheet.  Coumadin  Clinic [phone];  Procedure Fax Number  [phone]

## 2024-10-05 NOTE — Care Plan (Signed)
 Ortho Bundle Case Management Note  Patient Details  Name: [name] [name] MRN: [id number] Date of Birth: 1946/09/05  Patient will discharge to home with family to assist. No DME needed. OPPT set up with [facility] [facility]. Patient and MD in agreement with plan. Choice offered.                     DME Arranged:    DME Agency:     HH Arranged:    HH Agency:     Additional Comments: Please contact me with any questions of if this plan should need to change.  Charlies Pitch,  RN,BSN,MHA,CCM  [facility]  [phone] 10/05/2024, 4:24 PM

## 2024-10-10 NOTE — Progress Notes (Unsigned)
 Virtual Visit via Telephone Note   Because of [name] [name] co-morbid illnesses, she is at least at moderate risk for complications without adequate follow up.  This format is felt to be most appropriate for this patient at this time.  Due to technical limitations with video connection (technology), today's appointment will be conducted as an audio only telehealth visit, and [name] verbally agreed to proceed in this manner.   All issues noted in this document were discussed and addressed.  No physical exam could be performed with this format.  Evaluation Performed:  Preoperative cardiovascular risk assessment _____________   Date:  10/10/2024   Patient ID:  [name] [name], [name] [name] [birth year], MRN [id number] Patient Location:  Home Provider location:   Office  Primary Care Provider:  Jordan, Betty G, MD Primary Cardiologist:  Dorn Lesches, MD  Chief Complaint / Patient Profile   78 y.o. y/o female with a h/o hypertension, mitral regurgitation, paroxysmal atrial fibrillation HFrEF who is pending right reverse total shoulder arthroplasty and presents today for telephonic preoperative cardiovascular risk assessment.  History of Present Illness    [name] [name] is a 78 y.o. female who presents via audio/video conferencing for a telehealth visit today.  Pt was last seen in cardiology clinic on 11/16/2023 by Dr. Lesches.  At that time Dajsha Massaro was doing well .  The patient is now pending procedure as outlined above. Since her last visit, she continues to be stable from a cardiac standpoint.  Today she denies chest pain, shortness of breath, lower extremity edema, fatigue, palpitations, melena, hematuria, hemoptysis, diaphoresis, weakness, presyncope, syncope, orthopnea, and PND.   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   Dr Waylon, Veverly Persons, Dr Skeet     Abnormal CT scan  of lung 02/06/2021   Acute bursitis of right shoulder 07/22/2022   Injection given July 22, 2022     Acute post-traumatic headache, not intractable 11/15/2019   Allergic rhinitis    Atypical chest pain    Bilateral carpal tunnel syndrome 07/17/2017   EMG, Modena Callander, M.D  06/2017     Bilateral primary osteoarthritis of knee 10/03/2013   Bilateral injections given May 09, 2020     Bilateral shoulder pain 12/11/2017   Binocular vision disorder with diplopia 07/29/2021   Carbuncle of groin 02/25/2019   Cardiomegaly 02/05/2021   Cervicalgia 10/28/2022   Cervicogenic headache 06/03/2018   Chest pain 02/05/2021   Chronic midline low back pain without sciatica 09/15/2016   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   Degenerative spondylolisthesis 05/06/2022   Diverticulosis    Dupuytren's contracture of hand 07/20/2017   Dysrhythmia    Epigastric pain 12/26/2010   Essential hypertension 09/27/2010   Gastritis 11/08/2007, 04/12/2012   H pylori bx neg 03/2012   Generalized osteoarthritis of multiple sites 06/30/2017   GERD (gastroesophageal reflux disease) 11/08/2007   Heart failure 02/21/2021   Hepatic cyst    Hiatal hernia    11/08/2007, 04/12/2012   Hyperlipidemia    IBS (irritable bowel syndrome)    diarrhea predom   Joint pain 06/03/2016   Lateral meniscal tear 10/03/2013   Degenerative seen on ultrasound October 03, 2013     Left lower quadrant abdominal pain 05/01/2020   Leg cramps 06/10/2017   Long term (current) use of anticoagulants 05/29/2021   Migraine headache 09/27/2010   Mild cognitive impairment 08/18/2023   Mitral regurgitation  Murmur 02/21/2021   Osteopenia 07/20/2016   dexa 01/2019: Spine -2.2, RFN -2.2, LFN -2.0, decrease since prior scan.. frax -12.5%, 3%-we will look into Prolia -if not covered then Reclast  DEXA from [place]-osteoporosis  Prior dexa scans have showed osteoporosis     Osteoporosis    DEXA 01/21/2012: -2.1 spine and R fem, -1.8 L fem  s/p fosamax  2004-2011 DEXA 04/18/14 @ [facility]: -2.5 - rec to start Prolia      Paroxysmal atrial fibrillation 06/19/2021   Patellofemoral pain syndrome 10/03/2013   Personal history of urinary calculi 09/27/2010   Prediabetes 05/02/2019   PVC's (premature ventricular contractions)    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   Complex valvuloplasty including PTFE neochord placement x6 with 30 mm Medtronic Simuform ring annuloplasty with clipping of LA appendage   Schatzki's ring 11/11/2010   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   Shingles outbreak 04/2013   Skin tag 10/28/2022   Spinal stenosis, lumbar region, 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  Repeat injection given January 22, 2023     Trigger thumb of right hand 01/22/2023   Injection given January 22, 2023 repeat at the A1 pulley Apr 02, 2023     Varicose vein of leg    Past Surgical History:  Procedure Laterality Date   ANTERIOR CERVICAL DECOMP/DISCECTOMY FUSION N/A 05/06/2022   Procedure: CERVICAL THREE-FOUR, CERVICAL FOUR-FIVE ANTERIOR CERVICAL DECOMPRESSION/DISCECTOMY FUSION;  Surgeon: Louis Shove, MD;  Location: [facility] OR;  Service: Neurosurgery;  Laterality: N/A;   APPENDECTOMY  1958   BUBBLE STUDY  03/14/2021   Procedure: BUBBLE STUDY;  Surgeon: Shlomo Wilbert SAUNDERS, MD;  Location: [facility] ENDOSCOPY;  Service: Cardiovascular;;   CARDIAC CATHETERIZATION     CATARACT EXTRACTION, BILATERAL Bilateral    CLIPPING OF ATRIAL APPENDAGE N/A 05/21/2021   Procedure: CLIPPING OF ATRIAL APPENDAGE USING ATRICURE PRO2 CLIP;  Surgeon: Dusty Sudie DEL, MD;  Location: [facility] OR;  Service: Open Heart Surgery;  Laterality: N/A;   COLONOSCOPY     CYSTOCELE REPAIR  2008   ESOPHAGOGASTRODUODENOSCOPY     Maxilofacial  1992   MITRAL VALVE REPAIR   05/21/2021   Procedure: MINIMALLY INVASIVE MITRAL VALVE REPAIR (MVR) USING MEDTRONIC SIMUFORM RING;  Surgeon: Dusty Sudie DEL, MD;  Location: [facility] OR;  Service: Open Heart Surgery;;   RIGHT/LEFT HEART CATH AND CORONARY ANGIOGRAPHY N/A 03/14/2021   Procedure: RIGHT/LEFT HEART CATH AND CORONARY ANGIOGRAPHY;  Surgeon: Court Dorn PARAS, MD;  Location: [facility] INVASIVE CV LAB;  Service: Cardiovascular;  Laterality: N/A;   TEE WITHOUT CARDIOVERSION N/A 03/14/2021   Procedure: TRANSESOPHAGEAL ECHOCARDIOGRAM (TEE);  Surgeon: Shlomo Wilbert SAUNDERS, MD;  Location: [facility] ENDOSCOPY;  Service: Cardiovascular;  Laterality: N/A;   TEE WITHOUT CARDIOVERSION N/A 05/21/2021   Procedure: TRANSESOPHAGEAL ECHOCARDIOGRAM (TEE);  Surgeon: Dusty Sudie DEL, MD;  Location: [facility] OR;  Service: Open Heart Surgery;  Laterality: N/A;   TONSILLECTOMY  1960    Allergies  No Known Allergies  Home Medications    Prior to Admission medications   Medication Sig Start Date End Date Taking? Authorizing Provider  acetaminophen  (TYLENOL ) 650 MG CR tablet Take 650 mg by mouth every 8 (eight) hours as needed for pain (Headache).    [provider]  amLODipine  (NORVASC ) 5 MG tablet Take 1 tablet (5 mg total) by mouth daily. 12/03/23  Court Dorn PARAS, MD  aspirin  EC 81 MG EC tablet Take 1 tablet (81 mg total) by mouth daily. Swallow whole. 05/26/21   Barrett, Erin R, PA-C  atorvastatin  (LIPITOR) 20 MG tablet Take 1 tablet (20 mg total) by mouth daily. 08/23/24   Jordan, Betty G, MD  enoxaparin  (LOVENOX ) 60 MG/0.6ML injection Inject 0.6 mLs (60 mg total) into the skin every 12 (twelve) hours. 08/25/24   Court Dorn PARAS, MD  gabapentin  (NEURONTIN ) 100 MG capsule Take 1 capsule (100 mg total) by mouth in the morning AND 2 capsules (200 mg total) every evening. 09/13/24   Skeet Juliene SAUNDERS, DO  metoprolol  succinate (TOPROL -XL) 25 MG 24 hr tablet TAKE ONE TABLET BY MOUTH EVERY MORNING & TAKE ONE TABLET BY MOUTH EVERY EVENING Patient not taking:  Reported on 09/28/2024 05/25/23   Court Dorn PARAS, MD  metoprolol  succinate (TOPROL -XL) 25 MG 24 hr tablet Take 1 tablet (25 mg total) by mouth 2 (two) times daily. 12/03/23   Court Dorn PARAS, MD  Polyethyl Glycol-Propyl Glycol (SYSTANE) 0.4-0.3 % SOLN Place 1 drop into both eyes 2 (two) times daily.    [provider]  traMADol  (ULTRAM ) 50 MG tablet Take 1 tablet (50 mg total) by mouth 3 (three) times daily as needed FOR PAIN. Patient taking differently: Take 25-50 mg by mouth 3 (three) times daily as needed. 05/11/24     warfarin (COUMADIN ) 2 MG tablet Take 1-1&1/2 tablets (2-3 mg total) by mouth daily or as directed by Anticoagulation Clinic. Patient taking differently: Take 2-3 mg by mouth See admin instructions. Take 3 mg by mouth daily, except on Wednesdays take 2 mg 09/07/24   Court Dorn PARAS, MD    Physical Exam    Vital Signs:  Terril Amaro does not have vital signs available for review today.  Given telephonic nature of communication, physical exam is limited. AAOx3. NAD. Normal affect.  Speech and respirations are unlabored.  Accessory Clinical Findings    None  Assessment & Plan    1.  Preoperative Cardiovascular Risk Assessment:RIGHT REVERSE TOTAL SHOULDER ARTHROPLASTY   Date of Surgery:  Clearance 10/13/24                                  Surgeon:  DR. EVA HERRING Surgeon's Group or Practice Name:  [facility] Phone number:  [phone] VINA BONINE Fax number:  [phone]      Primary Cardiologist: Dorn Court, MD  Chart reviewed as part of pre-operative protocol coverage. Given past medical history and time since last visit, based on ACC/AHA guidelines, Patrecia Veiga would be at acceptable risk for the planned procedure without further cardiovascular testing.   Her RCRI is very low risk, 0.4% risk of major cardiac event.  She is able to complete greater than 4 METS of physical activity.  Patient was advised that if she  develops new symptoms prior to surgery to contact our office to arrange a follow-up appointment.  She verbalized understanding.  Patient has not  had an Afib/aflutter ablation in the last 3 months, DCCV within the last 4 weeks or a watchman implanted in the last 45 days    Per office protocol, patient can hold warfarin for 5 days prior to procedure.     Patient WILL need bridging with Lovenox  (enoxaparin ) around procedure. This will be organized by Coumadin  clinic.  I will route this recommendation to the requesting party via  Epic fax function and remove from pre-op pool.       Time:   Today, I have spent 5 minutes with the patient with telehealth technology discussing medical history, symptoms, and management plan.  I spent 10 minutes reviewing patient's past cardiac history and cardiac medications.    Josefa CHRISTELLA Beauvais, NP  10/10/2024, 3:48 PM

## 2024-10-11 ENCOUNTER — Ambulatory Visit: Attending: Cardiology

## 2024-10-11 ENCOUNTER — Encounter: Payer: Self-pay | Admitting: Family Medicine

## 2024-10-11 ENCOUNTER — Encounter: Admitting: Neurology

## 2024-10-11 DIAGNOSIS — Z01810 Encounter for preprocedural cardiovascular examination: Secondary | ICD-10-CM

## 2024-10-11 NOTE — Progress Notes (Signed)
 Anesthesia Chart Review   Case: [id number] Date/Time: 10/13/24 0915   Procedure: ARTHROPLASTY, SHOULDER, TOTAL, REVERSE (Right: Shoulder)   Anesthesia type: Choice   Diagnosis: Chronic right shoulder pain [M25.511, G89.29]   Pre-op diagnosis: PAIN IN RIGHT SHOULDER   Location: [facility] ROOM 07 / [facility]   Surgeons: Dozier Soulier, MD       DISCUSSION:78 y.o. never smoker with h/o HTN, PAF, s/p mitral valve repair 05/21/2021, right shoulder OA scheduled for above procedure 10/13/2024 with Dr. Soulier Dozier.  Per cardiology preoperative evaluation 10/11/2024, Chart reviewed as part of pre-operative protocol coverage. Given past medical history and time since last visit, based on ACC/AHA guidelines, [name] [name] would be at acceptable risk for the planned procedure without further cardiovascular testing.    Her RCRI is very low risk, 0.4% risk of major cardiac event.  She is able to complete greater than 4 METS of physical activity.   Patient was advised that if she develops new symptoms prior to surgery to contact our office to arrange a follow-up appointment.  She verbalized understanding.   Patient has not  had an Afib/aflutter ablation in the last 3 months, DCCV within the last 4 weeks or a watchman implanted in the last 45 days    Per office protocol, patient can hold warfarin for 5 days prior to procedure.     Patient WILL need bridging with Lovenox  (enoxaparin ) around procedure. This will be organized by Coumadin  clinic.  Lovenox  bridge instructions in 10/05/24 progress note, pt aware.  VS: BP 130/77   Pulse 71   Temp (!) 36.4 C (Oral)   Ht 5' 1 (1.549 m)   Wt 58.1 kg   SpO2 97%   BMI 24.19 kg/m   PROVIDERS: Jordan, Betty G, MD is PCP   Cardiologist - Court Dorn PARAS, MD  LABS: Labs reviewed: Acceptable for surgery. (all labs ordered are listed, but only abnormal results are displayed)  Labs Reviewed  SURGICAL PCR SCREEN      IMAGES:   EKG:   CV: Echo 07/22/2024 1. Left ventricular ejection fraction, by estimation, is 50 to 55%. The  left ventricle has low normal function. The left ventricle has no regional  wall motion abnormalities. Left ventricular diastolic parameters are  indeterminate.   2. Right ventricular systolic function is normal. The right ventricular  size is normal.   3. S/p MV repair (30 mm Medtronic Sinuform ring annuloplasty; procedure  date 05/21/21). Mean gradient through the valve is 4 mm Hg (HR 66 bpm). The  mitral valve has been repaired/replaced. Trivial mitral valve  regurgitation.   4. The aortic valve is tricuspid. Aortic valve regurgitation is trivial.   5. The inferior vena cava is normal in size with greater than 50%  respiratory variability, suggesting right atrial pressure of 3 mmHg.  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   Dr Waylon, Veverly Persons, Dr Skeet     Abnormal CT scan of lung 02/06/2021   Acute bursitis of right shoulder 07/22/2022   Injection given July 22, 2022     Acute post-traumatic headache, not intractable 11/15/2019   Allergic rhinitis    Atypical chest pain    Bilateral carpal tunnel syndrome 07/17/2017   EMG, Modena Callander, M.D  06/2017     Bilateral primary osteoarthritis of knee 10/03/2013   Bilateral injections given May 09, 2020     Bilateral shoulder pain 12/11/2017   Binocular vision disorder with diplopia 07/29/2021  Carbuncle of groin 02/25/2019   Cardiomegaly 02/05/2021   Cervicalgia 10/28/2022   Cervicogenic headache 06/03/2018   Chest pain 02/05/2021   Chronic midline low back pain without sciatica 09/15/2016   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   Degenerative spondylolisthesis 05/06/2022   Diverticulosis    Dupuytren's contracture of hand 07/20/2017   Dysrhythmia    Epigastric pain 12/26/2010   Essential hypertension 09/27/2010   Gastritis 11/08/2007, 04/12/2012   H  pylori bx neg 03/2012   Generalized osteoarthritis of multiple sites 06/30/2017   GERD (gastroesophageal reflux disease) 11/08/2007   Heart failure 02/21/2021   Hepatic cyst    Hiatal hernia    11/08/2007, 04/12/2012   Hyperlipidemia    IBS (irritable bowel syndrome)    diarrhea predom   Joint pain 06/03/2016   Lateral meniscal tear 10/03/2013   Degenerative seen on ultrasound October 03, 2013     Left lower quadrant abdominal pain 05/01/2020   Leg cramps 06/10/2017   Long term (current) use of anticoagulants 05/29/2021   Migraine headache 09/27/2010   Mild cognitive impairment 08/18/2023   Mitral regurgitation    Murmur 02/21/2021   Osteopenia 07/20/2016   dexa 01/2019: Spine -2.2, RFN -2.2, LFN -2.0, decrease since prior scan.. frax -12.5%, 3%-we will look into Prolia -if not covered then Reclast  DEXA from [place]-osteoporosis  Prior dexa scans have showed osteoporosis     Osteoporosis    DEXA 01/21/2012: -2.1 spine and R fem, -1.8 L fem s/p fosamax  2004-2011 DEXA 04/18/14 @ [facility]: -2.5 - rec to start Prolia      Paroxysmal atrial fibrillation 06/19/2021   Patellofemoral pain syndrome 10/03/2013   Personal history of urinary calculi 09/27/2010   Prediabetes 05/02/2019   PVC's (premature ventricular contractions)    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   Complex valvuloplasty including PTFE neochord placement x6 with 30 mm Medtronic Simuform ring annuloplasty with clipping of LA appendage   Schatzki's ring 11/11/2010   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   Shingles outbreak 04/2013   Skin tag 10/28/2022   Spinal stenosis, lumbar region, 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  Repeat injection given January 22, 2023     Trigger thumb of right hand 01/22/2023   Injection  given January 22, 2023 repeat at the A1 pulley Apr 02, 2023     Varicose vein of leg     Past Surgical History:  Procedure Laterality Date   ANTERIOR CERVICAL DECOMP/DISCECTOMY FUSION N/A 05/06/2022   Procedure: CERVICAL THREE-FOUR, CERVICAL FOUR-FIVE ANTERIOR CERVICAL DECOMPRESSION/DISCECTOMY FUSION;  Surgeon: Louis Shove, MD;  Location: [facility] OR;  Service: Neurosurgery;  Laterality: N/A;   APPENDECTOMY  1958   BUBBLE STUDY  03/14/2021   Procedure: BUBBLE STUDY;  Surgeon: Shlomo Wilbert SAUNDERS, MD;  Location: [facility] ENDOSCOPY;  Service: Cardiovascular;;   CARDIAC CATHETERIZATION     CATARACT EXTRACTION, BILATERAL Bilateral    CLIPPING OF ATRIAL APPENDAGE N/A 05/21/2021   Procedure: CLIPPING OF ATRIAL APPENDAGE USING ATRICURE PRO2 CLIP;  Surgeon: Dusty Sudie DEL, MD;  Location: [facility] OR;  Service: Open Heart Surgery;  Laterality: N/A;   COLONOSCOPY     CYSTOCELE REPAIR  2008   ESOPHAGOGASTRODUODENOSCOPY     Maxilofacial  1992   MITRAL VALVE REPAIR  05/21/2021   Procedure: MINIMALLY INVASIVE MITRAL VALVE REPAIR (MVR)  USING MEDTRONIC SIMUFORM RING;  Surgeon: Dusty Sudie DEL, MD;  Location: [facility] OR;  Service: Open Heart Surgery;;   RIGHT/LEFT HEART CATH AND CORONARY ANGIOGRAPHY N/A 03/14/2021   Procedure: RIGHT/LEFT HEART CATH AND CORONARY ANGIOGRAPHY;  Surgeon: Court Dorn PARAS, MD;  Location: [facility] INVASIVE CV LAB;  Service: Cardiovascular;  Laterality: N/A;   TEE WITHOUT CARDIOVERSION N/A 03/14/2021   Procedure: TRANSESOPHAGEAL ECHOCARDIOGRAM (TEE);  Surgeon: Shlomo Wilbert SAUNDERS, MD;  Location: [facility] ENDOSCOPY;  Service: Cardiovascular;  Laterality: N/A;   TEE WITHOUT CARDIOVERSION N/A 05/21/2021   Procedure: TRANSESOPHAGEAL ECHOCARDIOGRAM (TEE);  Surgeon: Dusty Sudie DEL, MD;  Location: [facility] OR;  Service: Open Heart Surgery;  Laterality: N/A;   TONSILLECTOMY  1960    MEDICATIONS:  acetaminophen  (TYLENOL ) 650 MG CR tablet   amLODipine  (NORVASC ) 5 MG tablet   aspirin  EC 81 MG EC tablet    atorvastatin  (LIPITOR) 20 MG tablet   enoxaparin  (LOVENOX ) 60 MG/0.6ML injection   gabapentin  (NEURONTIN ) 100 MG capsule   metoprolol  succinate (TOPROL -XL) 25 MG 24 hr tablet   metoprolol  succinate (TOPROL -XL) 25 MG 24 hr tablet   Polyethyl Glycol-Propyl Glycol (SYSTANE) 0.4-0.3 % SOLN   traMADol  (ULTRAM ) 50 MG tablet   warfarin (COUMADIN ) 2 MG tablet   No current facility-administered medications for this encounter.     Harlene Hoots Ward, PA-C [facility] Pre-Surgical Testing [phone]

## 2024-10-11 NOTE — Anesthesia Preprocedure Evaluation (Signed)
 Anesthesia Evaluation  Patient identified by MRN, date of birth, ID band Patient awake    Reviewed: Allergy & Precautions, NPO status , Patient's Chart, lab work & pertinent test results, reviewed documented beta blocker date and time   History of Anesthesia Complications Negative for: history of anesthetic complications  Airway Mallampati: II  TM Distance: >3 FB Neck ROM: Full    Dental  (+) Caps, Dental Advisory Given   Pulmonary neg pulmonary ROS   breath sounds clear to auscultation       Cardiovascular hypertension, Pt. on medications and Pt. on home beta blockers (-) angina + dysrhythmias Atrial Fibrillation + Valvular Problems/Murmurs (s/p MV repair)  Rhythm:Regular Rate:Normal  Echo 07/22/2024 1. Left ventricular ejection fraction, by estimation, is 50 to 55%. The  left ventricle has low normal function. The left ventricle has no regional  wall motion abnormalities. Left ventricular diastolic parameters are  indeterminate.   2. Right ventricular systolic function is normal. The right ventricular  size is normal.   3. S/p MV repair (30 mm Medtronic Sinuform ring annuloplasty; procedure  date 05/21/21). Mean gradient through the valve is 4 mm Hg (HR 66 bpm). The  mitral valve has been repaired/replaced. Trivial mitral valve  regurgitation.   4. The aortic valve is tricuspid. Aortic valve regurgitation is trivial.    Neuro/Psych  Headaches    GI/Hepatic Neg liver ROS, hiatal hernia,GERD  Controlled and Medicated,,  Endo/Other  negative endocrine ROS    Renal/GU negative Renal ROS     Musculoskeletal   Abdominal   Peds  Hematology Coumadin : last dose 10/07/2022 INR 1.1   Anesthesia Other Findings   Reproductive/Obstetrics                              Anesthesia Physical Anesthesia Plan  ASA: 3  Anesthesia Plan: General   Post-op Pain Management: Regional block* and Tylenol  PO  (pre-op)*   Induction: Intravenous  PONV Risk Score and Plan: 3 and Ondansetron , Dexamethasone  and Treatment may vary due to age or medical condition  Airway Management Planned: Oral ETT  Additional Equipment: None  Intra-op Plan:   Post-operative Plan: Extubation in OR  Informed Consent: I have reviewed the patients History and Physical, chart, labs and discussed the procedure including the risks, benefits and alternatives for the proposed anesthesia with the patient or authorized representative who has indicated his/her understanding and acceptance.     Dental advisory given  Plan Discussed with: CRNA and Surgeon  Anesthesia Plan Comments: (See PAT note 10/03/2024  Plan routine monitors, GETA with interscalene block for post op analgesia)         Anesthesia Quick Evaluation

## 2024-10-13 ENCOUNTER — Ambulatory Visit (HOSPITAL_COMMUNITY): Payer: Self-pay | Admitting: Physician Assistant

## 2024-10-13 ENCOUNTER — Ambulatory Visit (HOSPITAL_COMMUNITY)
Admission: RE | Admit: 2024-10-13 | Discharge: 2024-10-13 | Disposition: A | Discharge status: Home / Self Care | Source: Ambulatory Visit | Attending: Orthopedic Surgery | Admitting: Orthopedic Surgery

## 2024-10-13 ENCOUNTER — Encounter (HOSPITAL_COMMUNITY)
Admission: RE | Disposition: A | Discharge status: Home / Self Care | Payer: Self-pay | Source: Ambulatory Visit | Attending: Orthopedic Surgery

## 2024-10-13 ENCOUNTER — Encounter (HOSPITAL_COMMUNITY): Payer: Self-pay | Admitting: Orthopedic Surgery

## 2024-10-13 ENCOUNTER — Ambulatory Visit (HOSPITAL_COMMUNITY): Payer: Self-pay | Admitting: Certified Registered"

## 2024-10-13 ENCOUNTER — Other Ambulatory Visit (HOSPITAL_COMMUNITY): Payer: Self-pay

## 2024-10-13 DIAGNOSIS — I480 Paroxysmal atrial fibrillation: Secondary | ICD-10-CM | POA: Diagnosis not present

## 2024-10-13 DIAGNOSIS — M75121 Complete rotator cuff tear or rupture of right shoulder, not specified as traumatic: Secondary | ICD-10-CM | POA: Diagnosis not present

## 2024-10-13 DIAGNOSIS — Z7901 Long term (current) use of anticoagulants: Secondary | ICD-10-CM

## 2024-10-13 DIAGNOSIS — I110 Hypertensive heart disease with heart failure: Secondary | ICD-10-CM | POA: Diagnosis not present

## 2024-10-13 DIAGNOSIS — X58XXXA Exposure to other specified factors, initial encounter: Secondary | ICD-10-CM | POA: Insufficient documentation

## 2024-10-13 DIAGNOSIS — I509 Heart failure, unspecified: Secondary | ICD-10-CM | POA: Insufficient documentation

## 2024-10-13 DIAGNOSIS — M25711 Osteophyte, right shoulder: Secondary | ICD-10-CM | POA: Diagnosis not present

## 2024-10-13 DIAGNOSIS — S43004A Unspecified dislocation of right shoulder joint, initial encounter: Secondary | ICD-10-CM | POA: Diagnosis not present

## 2024-10-13 DIAGNOSIS — I5020 Unspecified systolic (congestive) heart failure: Secondary | ICD-10-CM

## 2024-10-13 DIAGNOSIS — G8918 Other acute postprocedural pain: Secondary | ICD-10-CM | POA: Diagnosis not present

## 2024-10-13 DIAGNOSIS — M75101 Unspecified rotator cuff tear or rupture of right shoulder, not specified as traumatic: Secondary | ICD-10-CM | POA: Insufficient documentation

## 2024-10-13 DIAGNOSIS — M25511 Pain in right shoulder: Secondary | ICD-10-CM | POA: Diagnosis not present

## 2024-10-13 HISTORY — PX: REVERSE SHOULDER ARTHROPLASTY: SHX5054

## 2024-10-13 LAB — APTT: aPTT: 28 s (ref 24–36)

## 2024-10-13 LAB — PROTIME-INR
INR: 1.1 (ref 0.8–1.2)
Prothrombin Time: 14.4 s (ref 11.4–15.2)

## 2024-10-13 SURGERY — ARTHROPLASTY, SHOULDER, TOTAL, REVERSE
Anesthesia: General | Site: Shoulder | Laterality: Right

## 2024-10-13 MED ORDER — PROPOFOL 10 MG/ML IV BOLUS
INTRAVENOUS | Status: AC
  Filled 2024-10-13: qty 20

## 2024-10-13 MED ORDER — OXYCODONE HCL 5 MG PO TABS: 5.0000 mg | ORAL_TABLET | Freq: Once | ORAL | Status: DC | PRN

## 2024-10-13 MED ORDER — BUPIVACAINE-EPINEPHRINE (PF) 0.5% -1:200000 IJ SOLN
INTRAMUSCULAR | Status: DC | PRN
  Administered 2024-10-13: 10 mL via PERINEURAL

## 2024-10-13 MED ORDER — BUPIVACAINE LIPOSOME 1.3 % IJ SUSP
INTRAMUSCULAR | Status: DC | PRN
  Administered 2024-10-13: 10 mL via PERINEURAL

## 2024-10-13 MED ORDER — LIDOCAINE HCL (PF) 2 % IJ SOLN
INTRAMUSCULAR | Status: AC
  Filled 2024-10-13: qty 5

## 2024-10-13 MED ORDER — PHENYLEPHRINE 80 MCG/ML (10ML) SYRINGE FOR IV PUSH (FOR BLOOD PRESSURE SUPPORT)
PREFILLED_SYRINGE | INTRAVENOUS | Status: DC | PRN
  Administered 2024-10-13: 160 ug via INTRAVENOUS

## 2024-10-13 MED ORDER — TIZANIDINE HCL 4 MG PO TABS
2.0000 mg | ORAL_TABLET | Freq: Four times a day (QID) | ORAL | 0 refills | Status: AC | PRN
  Filled 2024-10-13: qty 30, 15d supply, fill #0

## 2024-10-13 MED ORDER — METHOCARBAMOL 500 MG PO TABS: 500.0000 mg | ORAL_TABLET | Freq: Four times a day (QID) | ORAL | Status: DC | PRN

## 2024-10-13 MED ORDER — ACETAMINOPHEN 500 MG PO TABS
1000.0000 mg | ORAL_TABLET | Freq: Once | ORAL | Status: AC
  Administered 2024-10-13: 1000 mg via ORAL
  Filled 2024-10-13: qty 2

## 2024-10-13 MED ORDER — HYDROMORPHONE HCL 1 MG/ML IJ SOLN: 0.2500 mg | INTRAMUSCULAR | Status: DC | PRN

## 2024-10-13 MED ORDER — ONDANSETRON HCL 4 MG PO TABS: 4.0000 mg | ORAL_TABLET | Freq: Four times a day (QID) | ORAL | Status: DC | PRN

## 2024-10-13 MED ORDER — ALUM & MAG HYDROXIDE-SIMETH 200-200-20 MG/5ML PO SUSP: 30.0000 mL | ORAL | Status: DC | PRN

## 2024-10-13 MED ORDER — WATER FOR IRRIGATION, STERILE IR SOLN
Status: DC | PRN
  Administered 2024-10-13: 2000 mL

## 2024-10-13 MED ORDER — FENTANYL CITRATE (PF) 50 MCG/ML IJ SOSY
50.0000 ug | PREFILLED_SYRINGE | INTRAMUSCULAR | Status: DC
  Administered 2024-10-13: 100 ug via INTRAVENOUS
  Filled 2024-10-13: qty 2

## 2024-10-13 MED ORDER — SUGAMMADEX SODIUM 200 MG/2ML IV SOLN
INTRAVENOUS | Status: DC | PRN
  Administered 2024-10-13: 120 mg via INTRAVENOUS

## 2024-10-13 MED ORDER — MIDAZOLAM HCL (PF) 2 MG/2ML IJ SOLN: 0.5000 mg | Freq: Once | INTRAMUSCULAR | Status: DC | PRN

## 2024-10-13 MED ORDER — ACETAMINOPHEN 325 MG PO TABS: 325.0000 mg | ORAL_TABLET | Freq: Four times a day (QID) | ORAL | Status: DC | PRN

## 2024-10-13 MED ORDER — BISACODYL 5 MG PO TBEC: 5.0000 mg | DELAYED_RELEASE_TABLET | Freq: Every day | ORAL | Status: DC | PRN

## 2024-10-13 MED ORDER — DEXAMETHASONE SOD PHOSPHATE PF 10 MG/ML IJ SOLN
INTRAMUSCULAR | Status: DC | PRN
  Administered 2024-10-13: 4 mg via INTRAVENOUS

## 2024-10-13 MED ORDER — OXYCODONE HCL 5 MG PO TABS: 10.0000 mg | ORAL_TABLET | ORAL | Status: DC | PRN

## 2024-10-13 MED ORDER — ONDANSETRON HCL 4 MG/2ML IJ SOLN
INTRAMUSCULAR | Status: AC
  Filled 2024-10-13: qty 2

## 2024-10-13 MED ORDER — ACETAMINOPHEN 500 MG PO TABS: 1000.0000 mg | ORAL_TABLET | Freq: Four times a day (QID) | ORAL | Status: DC

## 2024-10-13 MED ORDER — MEPERIDINE HCL 25 MG/ML IJ SOLN: 6.2500 mg | INTRAMUSCULAR | Status: DC | PRN

## 2024-10-13 MED ORDER — ROCURONIUM BROMIDE 10 MG/ML (PF) SYRINGE
PREFILLED_SYRINGE | INTRAVENOUS | Status: DC | PRN
  Administered 2024-10-13: 50 mg via INTRAVENOUS

## 2024-10-13 MED ORDER — DIPHENHYDRAMINE HCL 12.5 MG/5ML PO ELIX: 12.5000 mg | ORAL_SOLUTION | ORAL | Status: DC | PRN

## 2024-10-13 MED ORDER — HYDROMORPHONE HCL 1 MG/ML IJ SOLN: 0.5000 mg | INTRAMUSCULAR | Status: DC | PRN

## 2024-10-13 MED ORDER — LIDOCAINE HCL (CARDIAC) PF 100 MG/5ML IV SOSY
PREFILLED_SYRINGE | INTRAVENOUS | Status: DC | PRN
  Administered 2024-10-13: 20 mg via INTRAVENOUS

## 2024-10-13 MED ORDER — PHENOL 1.4 % MT LIQD: 1.0000 | OROMUCOSAL | Status: DC | PRN

## 2024-10-13 MED ORDER — LACTATED RINGERS IV SOLN: INTRAVENOUS | Status: DC

## 2024-10-13 MED ORDER — SODIUM CHLORIDE 0.9 % IV SOLN: INTRAVENOUS | Status: DC

## 2024-10-13 MED ORDER — SUGAMMADEX SODIUM 200 MG/2ML IV SOLN
INTRAVENOUS | Status: AC
  Filled 2024-10-13: qty 2

## 2024-10-13 MED ORDER — OXYCODONE HCL 5 MG PO TABS
5.0000 mg | ORAL_TABLET | ORAL | 0 refills | Status: AC | PRN
  Filled 2024-10-13: qty 30, 5d supply, fill #0

## 2024-10-13 MED ORDER — MENTHOL 3 MG MT LOZG: 1.0000 | LOZENGE | OROMUCOSAL | Status: DC | PRN

## 2024-10-13 MED ORDER — ONDANSETRON HCL 4 MG/2ML IJ SOLN: 4.0000 mg | Freq: Four times a day (QID) | INTRAMUSCULAR | Status: DC | PRN

## 2024-10-13 MED ORDER — ROCURONIUM BROMIDE 10 MG/ML (PF) SYRINGE
PREFILLED_SYRINGE | INTRAVENOUS | Status: AC
  Filled 2024-10-13: qty 10

## 2024-10-13 MED ORDER — METHOCARBAMOL 1000 MG/10ML IJ SOLN: 500.0000 mg | Freq: Four times a day (QID) | INTRAMUSCULAR | Status: DC | PRN

## 2024-10-13 MED ORDER — OXYCODONE HCL 5 MG PO TABS: 5.0000 mg | ORAL_TABLET | ORAL | Status: DC | PRN

## 2024-10-13 MED ORDER — ONDANSETRON HCL 4 MG/2ML IJ SOLN
INTRAMUSCULAR | Status: DC | PRN
  Administered 2024-10-13: 4 mg via INTRAVENOUS

## 2024-10-13 MED ORDER — FLEET ENEMA RE ENEM: 1.0000 | ENEMA | Freq: Once | RECTAL | Status: DC | PRN

## 2024-10-13 MED ORDER — DOCUSATE SODIUM 100 MG PO CAPS: 100.0000 mg | ORAL_CAPSULE | Freq: Two times a day (BID) | ORAL | Status: DC

## 2024-10-13 MED ORDER — ORAL CARE MOUTH RINSE: 15.0000 mL | Freq: Once | OROMUCOSAL | Status: AC

## 2024-10-13 MED ORDER — SODIUM CHLORIDE 0.9 % IR SOLN
Status: DC | PRN
  Administered 2024-10-13: 1000 mL

## 2024-10-13 MED ORDER — POLYETHYLENE GLYCOL 3350 17 G PO PACK: 17.0000 g | PACK | Freq: Every day | ORAL | Status: DC | PRN

## 2024-10-13 MED ORDER — CHLORHEXIDINE GLUCONATE 0.12 % MT SOLN
15.0000 mL | Freq: Once | OROMUCOSAL | Status: AC
  Administered 2024-10-13: 15 mL via OROMUCOSAL

## 2024-10-13 MED ORDER — PHENYLEPHRINE HCL-NACL 20-0.9 MG/250ML-% IV SOLN
INTRAVENOUS | Status: DC | PRN
  Administered 2024-10-13: 20 ug/min via INTRAVENOUS

## 2024-10-13 MED ORDER — TRANEXAMIC ACID-NACL 1000-0.7 MG/100ML-% IV SOLN
1000.0000 mg | INTRAVENOUS | Status: AC
  Administered 2024-10-13: 1000 mg via INTRAVENOUS
  Filled 2024-10-13: qty 100

## 2024-10-13 MED ORDER — OXYCODONE HCL 5 MG/5ML PO SOLN: 5.0000 mg | Freq: Once | ORAL | Status: DC | PRN

## 2024-10-13 MED ORDER — CEFAZOLIN SODIUM-DEXTROSE 2-4 GM/100ML-% IV SOLN
2.0000 g | INTRAVENOUS | Status: AC
  Administered 2024-10-13: 2 g via INTRAVENOUS
  Filled 2024-10-13: qty 100

## 2024-10-13 MED ORDER — EPHEDRINE SULFATE-NACL 50-0.9 MG/10ML-% IV SOSY
PREFILLED_SYRINGE | INTRAVENOUS | Status: DC | PRN
  Administered 2024-10-13 (×2): 5 mg via INTRAVENOUS

## 2024-10-13 MED ORDER — PROPOFOL 10 MG/ML IV BOLUS
INTRAVENOUS | Status: DC | PRN
  Administered 2024-10-13: 100 mg via INTRAVENOUS

## 2024-10-13 SURGICAL SUPPLY — 59 items
BAG COUNTER SPONGE SURGICOUNT (BAG) IMPLANT
BAG ZIPLOCK 12X15 (MISCELLANEOUS) ×1 IMPLANT
BASEPLATE P2 COATD GLND 6.5X30 (Shoulder) IMPLANT
BIT DRILL 2.5 DIA 127 CALI (BIT) IMPLANT
BIT DRILL 4 DIA CALIBRATED (BIT) IMPLANT
BLADE SAW SGTL 73X25 THK (BLADE) ×1 IMPLANT
BOOTIES KNEE HIGH SLOAN (MISCELLANEOUS) ×2 IMPLANT
COOLER ICEMAN CLASSIC (MISCELLANEOUS) ×1 IMPLANT
COVER BACK TABLE 60X90IN (DRAPES) ×1 IMPLANT
COVER SURGICAL LIGHT HANDLE (MISCELLANEOUS) ×1 IMPLANT
DRAPE INCISE IOBAN 66X45 STRL (DRAPES) ×1 IMPLANT
DRAPE POUCH INSTRU U-SHP 10X18 (DRAPES) ×1 IMPLANT
DRAPE SHEET LG 3/4 BI-LAMINATE (DRAPES) ×1 IMPLANT
DRAPE SURG 17X11 SM STRL (DRAPES) ×1 IMPLANT
DRAPE SURG ORHT 6 SPLT 77X108 (DRAPES) ×2 IMPLANT
DRAPE TOP 10253 STERILE (DRAPES) ×1 IMPLANT
DRAPE U-SHAPE 47X51 STRL (DRAPES) ×1 IMPLANT
DRSG AQUACEL AG ADV 3.5X 6 (GAUZE/BANDAGES/DRESSINGS) ×1 IMPLANT
DURAPREP 26ML APPLICATOR (WOUND CARE) ×2 IMPLANT
ELECT BLADE TIP CTD 4 INCH (ELECTRODE) ×1 IMPLANT
ELECT PENCIL ROCKER SW 15FT (MISCELLANEOUS) ×1 IMPLANT
ELECT REM PT RETURN 15FT ADLT (MISCELLANEOUS) ×1 IMPLANT
FACESHIELD WRAPAROUND OR TEAM (MASK) ×1 IMPLANT
GLENOSPHERE RSA 32 -6 W/SCRW (Shoulder) IMPLANT
GLOVE BIO SURGEON STRL SZ7.5 (GLOVE) ×1 IMPLANT
GLOVE BIOGEL PI IND STRL 6.5 (GLOVE) ×1 IMPLANT
GLOVE BIOGEL PI IND STRL 8 (GLOVE) ×1 IMPLANT
GLOVE SURG SS PI 6.5 STRL IVOR (GLOVE) ×1 IMPLANT
GOWN STRL REUS W/ TWL LRG LVL3 (GOWN DISPOSABLE) ×1 IMPLANT
GOWN STRL REUS W/ TWL XL LVL3 (GOWN DISPOSABLE) ×1 IMPLANT
HOOD PEEL AWAY T7 (MISCELLANEOUS) ×3 IMPLANT
INSERT SMALL SOCKET 32MM NEU (Insert) IMPLANT
KIT BASIN OR (CUSTOM PROCEDURE TRAY) ×1 IMPLANT
KIT TURNOVER KIT A (KITS) ×1 IMPLANT
MANIFOLD NEPTUNE II (INSTRUMENTS) ×1 IMPLANT
NDL TROCAR POINT SZ 2 1/2 (NEEDLE) IMPLANT
NEEDLE TROCAR POINT SZ 2 1/2 (NEEDLE) IMPLANT
NS IRRIG 1000ML POUR BTL (IV SOLUTION) ×1 IMPLANT
PACK SHOULDER (CUSTOM PROCEDURE TRAY) ×1 IMPLANT
PAD COLD SHLDR WRAP-ON (PAD) ×1 IMPLANT
RESTRAINT HEAD UNIVERSAL NS (MISCELLANEOUS) ×1 IMPLANT
RETRIEVER SUT HEWSON (MISCELLANEOUS) IMPLANT
SCREW BONE RSP LOCK 5X14 (Screw) IMPLANT
SCREW BONE RSP LOCK 5X30 (Screw) IMPLANT
SET HNDPC FAN SPRY TIP SCT (DISPOSABLE) ×1 IMPLANT
SLING ARM FOAM STRAP LRG (SOFTGOODS) IMPLANT
SLING ARM FOAM STRAP MED (SOFTGOODS) IMPLANT
STEM HUMERAL 8X48 SHOULDER (Miscellaneous) IMPLANT
STRIP CLOSURE SKIN 1/2X4 (GAUZE/BANDAGES/DRESSINGS) ×1 IMPLANT
SUCTION TUBE FRAZIER 10FR DISP (SUCTIONS) IMPLANT
SUPPORT WRAP ARM LG (MISCELLANEOUS) ×1 IMPLANT
SUT ETHIBOND 2 V 37 (SUTURE) IMPLANT
SUT MNCRL AB 4-0 PS2 18 (SUTURE) ×1 IMPLANT
SUT VIC AB 2-0 CT1 TAPERPNT 27 (SUTURE) ×2 IMPLANT
SUTURE FIBERWR#2 38 REV NDL BL (SUTURE) IMPLANT
TAP SURG THRD DJ 6.5 (ORTHOPEDIC DISPOSABLE SUPPLIES) IMPLANT
TAPE SUT LABRALTAP WHT/BLK (SUTURE) IMPLANT
TOWEL OR 17X26 10 PK STRL BLUE (TOWEL DISPOSABLE) ×1 IMPLANT
TUBE SUCTION HIGH CAP CLEAR NV (SUCTIONS) ×1 IMPLANT

## 2024-10-13 NOTE — Evaluation (Signed)
 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     Clinical Impressions Pt is s/p shoulder replacement of right dominant upper extremity on 10-13-24. Therapist provided education and instruction to the patient and her spouse with regards to ROM/exercise protocol, post-op precautions, upper extremity and sling positioning, donning upper extremity clothing, recommendations for bathing while maintaining shoulder precautions, use of ice for pain and edema management, non weightbearing status, use of ice machine, sling wear schedule, and donning/doffing sling. Patient and her spouse verbalized and demonstrated understanding as needed. Patient needed assistance to donn shirt, underwear, pants, and shoes, with instruction provided on compensatory strategies to perform ADLs. Patient to follow physician's recommendations for discharge plan and the need for potential follow up therapy.       If plan is discharge home, recommend the following:   Assistance with cooking/housework;Assist for transportation;A little help with bathing/dressing/bathroom     Functional Status Assessment   Patient has had a recent decline in their functional status and demonstrates the ability to make significant improvements in function in a reasonable and predictable amount of time.     Equipment Recommendations   Tub/shower seat     Recommendations for Other Services         Precautions/Restrictions   Precautions Precautions: Shoulder Type of Shoulder Precautions: sling to be worn at all times except ADLs/exercise, no shoulder ROM, okay to perform elbow, wrist, and hand ROM Shoulder Interventions: Shoulder  sling/immobilizer Precaution Booklet Issued: Yes (comment) Recall of Precautions/Restrictions: Intact Required Braces or Orthoses: Sling Restrictions Weight Bearing Restrictions Per Provider Order: Yes RUE Weight Bearing Per Provider Order: Non weight bearing     Mobility Bed Mobility               General bed mobility comments: pt was received seated in the chair    Transfers Overall transfer level: Needs assistance Equipment used: None Transfers: Sit to/from Stand Sit to Stand: Supervision                  Balance Overall balance assessment: No apparent balance deficits (not formally assessed)              ADL either performed or assessed with clinical judgement   ADL Overall ADL's : Needs assistance/impaired               Lower Body Bathing Details (indicate cue type and reason): Patient required assist to donn an overhead under shirt and around the back button up shirt, seated in the chair. Upper Body Dressing : Sitting;Moderate assistance;Cueing for sequencing;Cueing for compensatory techniques Upper Body Dressing Details (indicate cue type and reason): Patient required assist to donn an overhead under shirt and around the back button up shirt, seated in the chair. Lower Body Dressing: Minimal assistance;Cueing for compensatory techniques;Sitting/lateral leans;Sit to/from stand Lower Body Dressing Details (indicate cue type and reason): Patient required assist to donn her underwear, pants, and shoes.               General ADL Comments: Per orders, no shoulder ROM and okay to perform elbow/wrist/hand ROM. While moving within specified parameters, pt/caregiver were instructed on bathing safety, how to donn/doff shirt by placing operative arm through sleeve first when donning and  off last when doffing shirt.Pt/caregiver were also educated on compensatory strategies for lower body ADLs and strategies to reduce risk of falls.  Pt/caregiver educated on  donning/doffing sling, to wear the sling at all times with the exception of ADL and exercise, and loosening the neck strap of the sling when the operative arm is in a supported position when sitting.   Education further provided regarding use of IceMan Cold Therapy machine, including the importance of using a barrier on the shoulder prior to positioning the wrap-on pad. Pt/caregiver verbalized/demonstrated understanding. Teach Back used while caregiver assisted with dressing.                  Pertinent Vitals/Pain Pain Assessment Pain Assessment: No/denies pain     Extremity/Trunk Assessment Upper Extremity Assessment Upper Extremity Assessment: Right hand dominant   Lower Extremity Assessment Lower Extremity Assessment: Overall WFL for tasks assessed       Communication Communication Communication: No apparent difficulties   Cognition Arousal: Alert Behavior During Therapy: WFL for tasks assessed/performed               OT - Cognition Comments: Oriented x4                 Following commands: Intact       Cueing  General Comments   Cueing Techniques: Verbal cues         Shoulder Instructions Shoulder Instructions Donning/doffing shirt without moving shoulder: Moderate assistance Method for sponge bathing under operated UE: Patient able to independently direct caregiver Donning/doffing sling/immobilizer: Maximal assistance Correct positioning of sling/immobilizer: Moderate assistance Pendulum exercises (written home exercise program):  (N/A) ROM for elbow, wrist and digits of operated UE: Patient able to independently direct caregiver Sling wearing schedule (on at all times/off for ADL's): Patient able to independently direct caregiver Proper positioning of operated UE when showering: Patient able to independently direct caregiver Dressing change: Patient able to independently direct caregiver Positioning of UE while sleeping: Patient able to  independently direct caregiver    Home Living Family/patient expects to be discharged to:: Private residence Living Arrangements: Spouse/significant other Available Help at Discharge: Family Type of Home: Apartment Home Access: Level entry     Home Layout: One level     Bathroom Shower/Tub: Tub/shower unit         Home Equipment: Agricultural Consultant (2 wheels);Cane - single point          Prior Functioning/Environment Prior Level of Function : Independent/Modified Independent;Driving             Mobility Comments: Independent with ambulation. ADLs Comments: She was independent with ADLs, cooking, cleaning, and driving.    OT Problem List: Impaired UE functional use;Decreased range of motion;Decreased strength        OT Goals(Current goals can be found in the care plan section)   Acute Rehab OT Goals OT Goal Formulation: All assessment and education complete, DC therapy      AM-PAC OT 6 Clicks Daily Activity     Outcome Measure Help from another person eating meals?: None Help from another person taking care of personal grooming?: A Little Help from another person toileting, which includes using toliet, bedpan, or urinal?: A Little Help from another person bathing (including washing, rinsing, drying)?: A Little Help from another person to put on and taking off regular upper body clothing?: A Lot Help from another person to put on and taking off regular lower body clothing?: A Little 6 Click Score: 18  End of Session Equipment Utilized During Treatment: Other (comment) (sling) Nurse Communication: Other (comment) (shoulder education completed)  Activity Tolerance: Patient tolerated treatment well Patient left: in chair;with call bell/phone within reach;with family/visitor present  OT Visit Diagnosis: Muscle weakness (generalized) (M62.81)                Time: 8595-8567 OT Time Calculation (min): 28 min Charges:  OT General Charges $OT Visit: 1 Visit OT  Evaluation $OT Eval Moderate Complexity: 1 Mod OT Treatments $Self Care/Home Management : 8-22 mins  Delanna JINNY Lesches, OTR/L 10/13/2024, 4:44 PM

## 2024-10-13 NOTE — Anesthesia Procedure Notes (Signed)
 Anesthesia Regional Block: Interscalene brachial plexus block   Pre-Anesthetic Checklist: , timeout performed,  Correct Patient, Correct Site, Correct Laterality,  Correct Procedure, Correct Position, site marked,  Risks and benefits discussed,  Surgical consent,  Pre-op evaluation,  At surgeon's request and post-op pain management  Laterality: Right and Upper  Prep: chloraprep       Needles:  Injection technique: Single-shot  Needle Type: Echogenic Needle     Needle Length: 9cm  Needle Gauge: 21     Additional Needles:   Procedures:,,,, ultrasound used (permanent image in chart),,    Narrative:  Start time: 10/13/2024 9:41 AM End time: 10/13/2024 9:50 AM Injection made incrementally with aspirations every 5 mL.  Performed by: Personally  Anesthesiologist: Leonce Athens, MD  Additional Notes: Pt identified in Holding room.  Monitors applied. Working IV access confirmed. Timeout, Sterile prep R clavicle and neck.  #21ga ECHOgenic Arrow block needle to interscalene brachial plexus with US  guidance.  10cc 0.5% Bupivacaine  1:200k epi, Exparel  injected incrementally after negative test dose.  Patient asymptomatic, VSS, no heme aspirated, tolerated well.  JAYSON Leonce, MD

## 2024-10-13 NOTE — Discharge Instructions (Signed)
 Discharge Instructions after Reverse Total Shoulder Arthroplasty   A sling has been provided for you. You are to wear this at all times (except for bathing and dressing), until your first post operative visit with Dr. Dozier. Please also wear while sleeping at night. While you bath and dress, let the arm/elbow extend straight down to stretch your elbow. Wiggle your fingers and pump your first while your in the sling to prevent hand swelling. Use ice on the shoulder intermittently over the first 48 hours after surgery. Continue to use ice or and ice machine as needed after 48 hours for pain control/swelling.  Pain medicine has been prescribed for you.  Use your medicine liberally over the first 48 hours, and then you can begin to taper your use. You may take Extra Strength Tylenol  or Tylenol  only in place of the pain pills. DO NOT take ANY nonsteroidal anti-inflammatory pain medications: Advil, Motrin, Ibuprofen, Aleve, Naproxen or Naprosyn.  Resume Coumadin  the day after surgery. Follow instructions per coumadin  clinic/PCP in regards dosing and getting PT/INR back in therapeutic range Leave your dressing on until your first follow up visit.  You may shower with the dressing.  Hold your arm as if you still have your sling on while you shower. Simply allow the water to wash over the site and then pat dry. Make sure your axilla (armpit) is completely dry after showering.    Please call [phone] during normal business hours or [phone] after hours for any problems. Including the following:  - excessive redness of the incisions - drainage for more than 4 days - fever of more than 101.5 F  *Please note that pain medications will not be refilled after hours or on weekends.   Dental Antibiotics:  In most cases prophylactic antibiotics for Dental procdeures after total joint surgery are not necessary.  Exceptions are as follows:  1. History of prior total joint infection  2. Severely  immunocompromised (Organ Transplant, cancer chemotherapy, Rheumatoid biologic meds such as Humera)  3. Poorly controlled diabetes (A1C &gt; 8.0, blood glucose over 200)  If you have one of these conditions, contact your surgeon for an antibiotic prescription, prior to your dental procedure.

## 2024-10-13 NOTE — Op Note (Signed)
 Procedure(s): ARTHROPLASTY, SHOULDER, TOTAL, REVERSE Procedure Note  [name] [name] female 78 y.o. 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      Surgeon: Josefa LELON Herring   Assistants: Jeoffrey Northern PA-C [name] was present and scrubbed throughout the procedure and was essential in positioning, retraction, exposure, and closure)  Anesthesia: General endotracheal anesthesia with preoperative interscalene block given by the attending anesthesiologist  Procedure Detail  ARTHROPLASTY, SHOULDER, TOTAL, REVERSE   Estimated Blood Loss:  200 mL         Drains: none  Blood Given: none          Specimens: none        Complications:  * No complications entered in OR log *         Disposition: PACU - hemodynamically stable.         Condition: stable      OPERATIVE FINDINGS:  A DJO Altivate pressfit reverse total shoulder arthroplasty was placed with a  size 8 stem, a 32-6 glenosphere, and a standard-mm poly insert. The base plate  fixation was excellent.  PROCEDURE: The patient was identified in the preoperative holding area  where I personally marked the operative site after verifying site, side,  and procedure with the patient. An interscalene block given by  the attending anesthesiologist in the holding area and the patient was taken back to the operating room where all extremities were  carefully padded in position after general anesthesia was induced. She  was placed in a beach-chair position and the operative upper extremity was  prepped and draped in a standard sterile fashion. An approximately 10-  cm incision  was made from the tip of the coracoid process to the center  point of the humerus at the level of the axilla. Dissection was carried  down through subcutaneous tissues to the level of the cephalic vein  which was taken laterally with the deltoid. The pectoralis major was  retracted medially. The subdeltoid space was developed and the lateral  edge of the conjoined tendon was identified. The undersurface of  conjoined tendon was palpated and the musculocutaneous nerve was not in  the field. Retractor was placed underneath the conjoined and second  retractor was placed lateral into the deltoid. The circumflex humeral  artery and vessels were identified and clamped and coagulated. The  biceps tendon was absent.  The subscapularis was very thin and taken down as a peel.  The  joint was then gently externally rotated while the capsule was released  from the humeral neck around to just beyond the 6 o'clock position. At  this point, the joint was dislocated and the humeral head was presented  into the wound. The excessive osteophyte formation was removed with a  large rongeur.  The cutting guide was used to make the appropriate  head cut and the head was saved for potentially bone grafting.  The glenoid was exposed with the arm in an  abducted extended position. The anterior and posterior labrum were  completely excised and the capsule was released circumferentially to  allow for exposure of the glenoid for preparation. The 2.5 mm drill was  placed using the guide in 5-10 inferior angulation and the tap was  then advanced in the same hole. Small and large reamers were then used. The tap was then removed and the Metaglene was then screwed in with excellent purchase.  The peripheral guide was then used to drilled measured and filled peripheral locking screws. The size 32-6 glenosphere was then impacted on the [facility] taper and the central screw was placed. The humerus was then again exposed and the  diaphyseal reamers were used followed by the metaphyseal reamers. The final broach was left in place in the proximal trial was placed. The joint was reduced and with this implant it was felt that soft tissue tensioning was appropriate with excellent stability and excellent range of motion. Therefore, final humeral stem was placed press-fit.  And then the trial polyethylene inserts were tested again and the above implant was felt to be the most appropriate for final insertion. The joint was reduced taken through full range of motion and felt to be stable. Soft tissue tension was appropriate.  The joint was then copiously irrigated with pulse  lavage and the wound was then closed. The subscapularis could not be repaired.  Skin was closed with 2-0 Vicryl in a deep dermal layer and 4-0  Monocryl for skin closure. Steri-Strips were applied. Sterile  dressings were then applied as well as a sling. The patient was allowed  to awaken from general anesthesia, transferred to stretcher, and taken  to recovery room in stable condition.   POSTOPERATIVE PLAN: The patient will be observed in the recovery room and if pain is well-controlled with regional anesthesia and she is hemodynamically stable she can be discharged home today with family.

## 2024-10-13 NOTE — Anesthesia Procedure Notes (Signed)
 Procedure Name: Intubation Date/Time: 10/13/2024 10:14 AM  Performed by: Metta Andrea NOVAK, CRNAPre-anesthesia Checklist: Patient identified, Emergency Drugs available, Suction available, Patient being monitored and Timeout performed Patient Re-evaluated:Patient Re-evaluated prior to induction Oxygen Delivery Method: Circle system utilized Preoxygenation: Pre-oxygenation with 100% oxygen Induction Type: IV induction Ventilation: Mask ventilation without difficulty Laryngoscope Size: Glidescope and 3 Tube type: Oral Tube size: 7.0 mm Number of attempts: 1 Airway Equipment and Method: Stylet Placement Confirmation: ETT inserted through vocal cords under direct vision, positive ETCO2 and breath sounds checked- equal and bilateral Secured at: 21 cm Tube secured with: Tape Dental Injury: Teeth and Oropharynx as per pre-operative assessment

## 2024-10-13 NOTE — H&P (Signed)
 [name] [name] is an 78 y.o. female.   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  Failed conservative treatment.  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   Dr Waylon, Veverly Persons, Dr Skeet     Abnormal CT scan of lung 02/06/2021   Acute bursitis of right shoulder 07/22/2022   Injection given July 22, 2022     Acute post-traumatic headache, not intractable 11/15/2019   Allergic rhinitis    Atypical chest pain    Bilateral carpal tunnel syndrome 07/17/2017   EMG, Modena Callander, M.D  06/2017     Bilateral primary osteoarthritis of knee 10/03/2013   Bilateral injections given May 09, 2020     Bilateral shoulder pain 12/11/2017   Binocular vision disorder with diplopia 07/29/2021   Carbuncle of groin 02/25/2019   Cardiomegaly 02/05/2021   Cervicalgia 10/28/2022   Cervicogenic headache 06/03/2018   Chest pain 02/05/2021   Chronic midline low back pain without sciatica 09/15/2016   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   Degenerative spondylolisthesis 05/06/2022   Diverticulosis    Dupuytren's contracture of hand 07/20/2017   Dysrhythmia    Epigastric pain 12/26/2010   Essential hypertension 09/27/2010   Gastritis 11/08/2007, 04/12/2012   H pylori bx neg 03/2012   Generalized osteoarthritis of multiple sites 06/30/2017   GERD (gastroesophageal reflux disease) 11/08/2007   Heart failure 02/21/2021   Hepatic cyst    Hiatal hernia    11/08/2007, 04/12/2012   Hyperlipidemia    IBS (irritable bowel syndrome)    diarrhea predom   Joint pain 06/03/2016   Lateral meniscal tear 10/03/2013   Degenerative seen on ultrasound October 03, 2013     Left lower quadrant abdominal pain 05/01/2020   Leg cramps 06/10/2017   Long term (current) use of anticoagulants 05/29/2021   Migraine headache 09/27/2010   Mild cognitive impairment 08/18/2023   Mitral regurgitation     Murmur 02/21/2021   Osteopenia 07/20/2016   dexa 01/2019: Spine -2.2, RFN -2.2, LFN -2.0, decrease since prior scan.. frax -12.5%, 3%-we will look into Prolia -if not covered then Reclast  DEXA from [place]-osteoporosis  Prior dexa scans have showed osteoporosis     Osteoporosis    DEXA 01/21/2012: -2.1 spine and R fem, -1.8 L fem s/p fosamax  2004-2011 DEXA 04/18/14 @ [facility]: -2.5 - rec to start Prolia      Paroxysmal atrial fibrillation 06/19/2021   Patellofemoral pain syndrome 10/03/2013   Personal history of urinary calculi 09/27/2010   Prediabetes 05/02/2019   PVC's (premature ventricular contractions)    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   Complex valvuloplasty including PTFE neochord placement x6 with 30 mm Medtronic Simuform ring annuloplasty with clipping of LA appendage   Schatzki's ring 11/11/2010   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   Shingles outbreak 04/2013   Skin tag 10/28/2022   Spinal stenosis, lumbar region, 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  Repeat injection given January 22, 2023     Trigger thumb of right hand 01/22/2023   Injection given January 22, 2023 repeat at the A1 pulley Apr 02, 2023     Varicose vein of leg     Past Surgical History:  Procedure Laterality Date   ANTERIOR CERVICAL  DECOMP/DISCECTOMY FUSION N/A 05/06/2022   Procedure: CERVICAL THREE-FOUR, CERVICAL FOUR-FIVE ANTERIOR CERVICAL DECOMPRESSION/DISCECTOMY FUSION;  Surgeon: Louis Shove, MD;  Location: [facility] OR;  Service: Neurosurgery;  Laterality: N/A;   APPENDECTOMY  1958   BUBBLE STUDY  03/14/2021   Procedure: BUBBLE STUDY;  Surgeon: Shlomo Wilbert SAUNDERS, MD;  Location: [facility] ENDOSCOPY;  Service: Cardiovascular;;   CARDIAC CATHETERIZATION     CATARACT EXTRACTION, BILATERAL Bilateral     CLIPPING OF ATRIAL APPENDAGE N/A 05/21/2021   Procedure: CLIPPING OF ATRIAL APPENDAGE USING ATRICURE PRO2 CLIP;  Surgeon: Dusty Sudie DEL, MD;  Location: [facility] OR;  Service: Open Heart Surgery;  Laterality: N/A;   COLONOSCOPY     CYSTOCELE REPAIR  2008   ESOPHAGOGASTRODUODENOSCOPY     Maxilofacial  1992   MITRAL VALVE REPAIR  05/21/2021   Procedure: MINIMALLY INVASIVE MITRAL VALVE REPAIR (MVR) USING MEDTRONIC SIMUFORM RING;  Surgeon: Dusty Sudie DEL, MD;  Location: [facility] OR;  Service: Open Heart Surgery;;   RIGHT/LEFT HEART CATH AND CORONARY ANGIOGRAPHY N/A 03/14/2021   Procedure: RIGHT/LEFT HEART CATH AND CORONARY ANGIOGRAPHY;  Surgeon: Court Dorn PARAS, MD;  Location: [facility] INVASIVE CV LAB;  Service: Cardiovascular;  Laterality: N/A;   TEE WITHOUT CARDIOVERSION N/A 03/14/2021   Procedure: TRANSESOPHAGEAL ECHOCARDIOGRAM (TEE);  Surgeon: Shlomo Wilbert SAUNDERS, MD;  Location: [facility] ENDOSCOPY;  Service: Cardiovascular;  Laterality: N/A;   TEE WITHOUT CARDIOVERSION N/A 05/21/2021   Procedure: TRANSESOPHAGEAL ECHOCARDIOGRAM (TEE);  Surgeon: Dusty Sudie DEL, MD;  Location: [facility] OR;  Service: Open Heart Surgery;  Laterality: N/A;   TONSILLECTOMY  1960    Family History  Problem Relation Age of Onset   Hypertension Mother    Alzheimer's disease Mother 18   AAA (abdominal aortic aneurysm) Mother    Stroke Father 66   Kidney cancer Brother        mets   Bone cancer Brother 55   Colon cancer Neg Hx    Social History:  reports that she has never smoked. She has never used smokeless tobacco. She reports that she does not drink alcohol  and does not use drugs.  Allergies: No Known Allergies  Medications Prior to Admission  Medication Sig Dispense Refill   acetaminophen  (TYLENOL ) 650 MG CR tablet Take 650 mg by mouth every 8 (eight) hours as needed for pain (Headache).     amLODipine  (NORVASC ) 5 MG tablet Take 1 tablet (5 mg total) by mouth daily. 90 tablet 3   aspirin  EC 81 MG EC tablet Take 1 tablet  (81 mg total) by mouth daily. Swallow whole. 30 tablet 11   atorvastatin  (LIPITOR) 20 MG tablet Take 1 tablet (20 mg total) by mouth daily. 90 tablet 1   enoxaparin  (LOVENOX ) 60 MG/0.6ML injection Inject 0.6 mLs (60 mg total) into the skin every 12 (twelve) hours. 12 mL 1   gabapentin  (NEURONTIN ) 100 MG capsule Take 1 capsule (100 mg total) by mouth in the morning AND 2 capsules (200 mg total) every evening. 90 capsule 5   metoprolol  succinate (TOPROL -XL) 25 MG 24 hr tablet Take 1 tablet (25 mg total) by mouth 2 (two) times daily. 180 tablet 3   Polyethyl Glycol-Propyl Glycol (SYSTANE) 0.4-0.3 % SOLN Place 1 drop into both eyes 2 (two) times daily.     traMADol  (ULTRAM ) 50 MG tablet Take 1 tablet (50 mg total) by mouth 3 (three) times daily as needed FOR PAIN. (Patient taking differently: Take 25-50 mg by mouth 3 (three) times daily as needed.) 15 tablet 0  warfarin (COUMADIN ) 2 MG tablet Take 1-1&1/2 tablets (2-3 mg total) by mouth daily or as directed by Anticoagulation Clinic. (Patient taking differently: Take 2-3 mg by mouth See admin instructions. Take 3 mg by mouth daily, except on Wednesdays take 2 mg) 120 tablet 1   metoprolol  succinate (TOPROL -XL) 25 MG 24 hr tablet TAKE ONE TABLET BY MOUTH EVERY MORNING & TAKE ONE TABLET BY MOUTH EVERY EVENING (Patient not taking: Reported on 09/28/2024) 180 tablet 2    No results found for this or any previous visit (from the past 48 hours). No results found.  Review of Systems  All other systems reviewed and are negative.   Blood pressure 128/86, pulse 64, temperature 98.1 F (36.7 C), temperature source Oral, height 5' 1 (1.549 m), weight 58.1 kg, SpO2 97%. Physical Exam HENT:     Head: Atraumatic.  Eyes:     Extraocular Movements: Extraocular movements intact.  Cardiovascular:     Pulses: Normal pulses.  Pulmonary:     Effort: Pulmonary effort is normal.  Musculoskeletal:     Comments: Right shoulder pain and weakness with attempted ROM.  NVID.  Neurological:     Mental Status: She is alert.      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  Failed conservative treatment. Plan R reverse TSA Risks / benefits of surgery discussed Consent on chart  NPO for OR Preop antibiotics   Josefa LELON Herring, MD 10/13/2024, 8:52 AM

## 2024-10-13 NOTE — Anesthesia Postprocedure Evaluation (Signed)
 Anesthesia Post Note  Patient: [name] [name]  Procedure(s) Performed: ARTHROPLASTY, SHOULDER, TOTAL, REVERSE (Right: Shoulder)     Patient location during evaluation: Phase II Anesthesia Type: General Level of consciousness: awake and alert, patient cooperative and oriented Pain management: pain level controlled Vital Signs Assessment: post-procedure vital signs reviewed and stable Respiratory status: spontaneous breathing, nonlabored ventilation and respiratory function stable Cardiovascular status: blood pressure returned to baseline and stable Postop Assessment: no apparent nausea or vomiting, able to ambulate and adequate PO intake Anesthetic complications: no   No notable events documented.  Last Vitals:  Vitals:   10/13/24 1230 10/13/24 1245  BP: (!) 134/97 109/73  Pulse: 70 69  Resp:    Temp: 36.4 C   SpO2: 97% 95%    Last Pain:  Vitals:   10/13/24 1230  TempSrc:   PainSc: 0-No pain                 Cantrell Martus,E. Cashawn Yanko

## 2024-10-13 NOTE — Transfer of Care (Signed)
 Immediate Anesthesia Transfer of Care Note  Patient: [name] [name]  Procedure(s) Performed: ARTHROPLASTY, SHOULDER, TOTAL, REVERSE (Right: Shoulder)  Patient Location: PACU  Anesthesia Type:General  Level of Consciousness: awake, drowsy, and patient cooperative  Airway & Oxygen Therapy: Patient Spontanous Breathing and Patient connected to face mask oxygen  Post-op Assessment: Report given to RN and Post -op Vital signs reviewed and stable  Post vital signs: Reviewed and stable  Last Vitals:  Vitals Value Taken Time  BP 148/82 10/13/24 11:30  Temp    Pulse 83 10/13/24 11:31  Resp 11 10/13/24 11:31  SpO2 100 % 10/13/24 11:31  Vitals shown include unfiled device data.  Last Pain:  Vitals:   10/13/24 0756  TempSrc:   PainSc: 0-No pain         Complications: No notable events documented.

## 2024-10-14 ENCOUNTER — Other Ambulatory Visit (HOSPITAL_BASED_OUTPATIENT_CLINIC_OR_DEPARTMENT_OTHER): Payer: Self-pay

## 2024-10-14 ENCOUNTER — Encounter (HOSPITAL_COMMUNITY): Payer: Self-pay | Admitting: Orthopedic Surgery

## 2024-10-14 ENCOUNTER — Other Ambulatory Visit (HOSPITAL_COMMUNITY): Payer: Self-pay

## 2024-10-18 ENCOUNTER — Institutional Professional Consult (permissible substitution): Payer: Medicare HMO | Admitting: Psychology

## 2024-10-18 ENCOUNTER — Ambulatory Visit: Payer: Self-pay

## 2024-10-19 ENCOUNTER — Ambulatory Visit: Admitting: Family Medicine

## 2024-10-24 ENCOUNTER — Other Ambulatory Visit (HOSPITAL_COMMUNITY): Payer: Self-pay

## 2024-10-24 ENCOUNTER — Ambulatory Visit: Attending: Cardiovascular Disease | Admitting: Pharmacist

## 2024-10-24 DIAGNOSIS — Z7901 Long term (current) use of anticoagulants: Secondary | ICD-10-CM | POA: Diagnosis not present

## 2024-10-24 DIAGNOSIS — I340 Nonrheumatic mitral (valve) insufficiency: Secondary | ICD-10-CM | POA: Diagnosis not present

## 2024-10-24 DIAGNOSIS — Z9889 Other specified postprocedural states: Secondary | ICD-10-CM | POA: Diagnosis not present

## 2024-10-24 LAB — POCT INR: INR: 2 (ref 2.0–3.0)

## 2024-10-24 NOTE — Patient Instructions (Signed)
 Description   INR-2.0  Continue taking warfarin 1.5 tablets daily except for 1 tablet on Wednesday.     The medications we talked about are called Eliquis and Xarelto  Coumadin  Clinic [phone];  Procedure Fax Number  [phone]

## 2024-10-24 NOTE — Progress Notes (Signed)
 Description   INR-2.0  Continue taking warfarin 1.5 tablets daily except for 1 tablet on Wednesday.     The medications we talked about are called Eliquis and Xarelto  Coumadin  Clinic [phone];  Procedure Fax Number  [phone]

## 2024-10-28 DIAGNOSIS — Z96611 Presence of right artificial shoulder joint: Secondary | ICD-10-CM | POA: Diagnosis not present

## 2024-11-01 DIAGNOSIS — M25611 Stiffness of right shoulder, not elsewhere classified: Secondary | ICD-10-CM | POA: Diagnosis not present

## 2024-11-01 DIAGNOSIS — Z96611 Presence of right artificial shoulder joint: Secondary | ICD-10-CM | POA: Diagnosis not present

## 2024-11-03 DIAGNOSIS — Z96611 Presence of right artificial shoulder joint: Secondary | ICD-10-CM | POA: Diagnosis not present

## 2024-11-03 DIAGNOSIS — M25611 Stiffness of right shoulder, not elsewhere classified: Secondary | ICD-10-CM | POA: Diagnosis not present

## 2024-11-10 DIAGNOSIS — Z96611 Presence of right artificial shoulder joint: Secondary | ICD-10-CM | POA: Diagnosis not present

## 2024-11-10 DIAGNOSIS — M25611 Stiffness of right shoulder, not elsewhere classified: Secondary | ICD-10-CM | POA: Diagnosis not present

## 2024-11-14 ENCOUNTER — Other Ambulatory Visit: Payer: Self-pay | Admitting: Cardiovascular Disease

## 2024-11-14 ENCOUNTER — Other Ambulatory Visit (HOSPITAL_COMMUNITY): Payer: Self-pay

## 2024-11-14 DIAGNOSIS — Z96611 Presence of right artificial shoulder joint: Secondary | ICD-10-CM | POA: Diagnosis not present

## 2024-11-14 DIAGNOSIS — M25611 Stiffness of right shoulder, not elsewhere classified: Secondary | ICD-10-CM | POA: Diagnosis not present

## 2024-11-14 DIAGNOSIS — H00012 Hordeolum externum right lower eyelid: Secondary | ICD-10-CM | POA: Diagnosis not present

## 2024-11-28 ENCOUNTER — Ambulatory Visit: Attending: Cardiovascular Disease

## 2024-11-28 DIAGNOSIS — Z9889 Other specified postprocedural states: Secondary | ICD-10-CM

## 2024-11-28 DIAGNOSIS — I340 Nonrheumatic mitral (valve) insufficiency: Secondary | ICD-10-CM

## 2024-11-28 DIAGNOSIS — Z7901 Long term (current) use of anticoagulants: Secondary | ICD-10-CM | POA: Diagnosis not present

## 2024-11-28 LAB — POCT INR: INR: 2.3 (ref 2.0–3.0)

## 2024-11-28 NOTE — Patient Instructions (Signed)
 Description   INR-2.3  Continue taking warfarin 1.5 tablets daily except for 1 tablet on Wednesdays.   Recheck in 6 weeks.   The medications we talked about are called Eliquis and Xarelto  Coumadin  Clinic [phone];  Procedure Fax Number  [phone]

## 2024-11-28 NOTE — Progress Notes (Signed)
 Description   INR-2.3  Continue taking warfarin 1.5 tablets daily except for 1 tablet on Wednesdays.   Recheck in 6 weeks.   The medications we talked about are called Eliquis and Xarelto  Coumadin  Clinic [phone];  Procedure Fax Number  [phone]

## 2024-12-07 ENCOUNTER — Encounter: Payer: Medicare HMO | Admitting: Psychology

## 2024-12-07 ENCOUNTER — Other Ambulatory Visit (HOSPITAL_COMMUNITY): Payer: Self-pay

## 2024-12-07 ENCOUNTER — Other Ambulatory Visit: Payer: Self-pay | Admitting: Cardiovascular Disease

## 2024-12-07 MED ORDER — AMLODIPINE BESYLATE 5 MG PO TABS
5.0000 mg | ORAL_TABLET | Freq: Every day | ORAL | 3 refills | Status: AC
  Filled 2024-12-11: qty 90, 90d supply, fill #0

## 2024-12-09 ENCOUNTER — Encounter: Payer: Self-pay | Admitting: Family Medicine

## 2024-12-09 ENCOUNTER — Ambulatory Visit (INDEPENDENT_AMBULATORY_CARE_PROVIDER_SITE_OTHER): Admitting: Family Medicine

## 2024-12-09 ENCOUNTER — Ambulatory Visit: Payer: Self-pay

## 2024-12-09 VITALS — BP 128/82 | HR 89 | Temp 98.0°F | Resp 16 | Ht 61.0 in | Wt 133.4 lb

## 2024-12-09 DIAGNOSIS — R194 Change in bowel habit: Secondary | ICD-10-CM

## 2024-12-09 DIAGNOSIS — K625 Hemorrhage of anus and rectum: Secondary | ICD-10-CM | POA: Diagnosis not present

## 2024-12-09 DIAGNOSIS — M816 Localized osteoporosis [Lequesne]: Secondary | ICD-10-CM | POA: Diagnosis not present

## 2024-12-09 DIAGNOSIS — K6289 Other specified diseases of anus and rectum: Secondary | ICD-10-CM

## 2024-12-09 DIAGNOSIS — L905 Scar conditions and fibrosis of skin: Secondary | ICD-10-CM

## 2024-12-09 NOTE — Telephone Encounter (Signed)
 FYI Only or Action Required?: FYI only for provider: appointment scheduled on 01.16.26.  Patient was last seen in primary care on 09/28/2024 by Jordan, Betty G, MD.  Called Nurse Triage reporting Abdominal Pain.  Symptoms began several months ago.  Interventions attempted: Nothing.  Symptoms are: gradually worsening.  Triage Disposition: See Physician Within 24 Hours  Patient/caregiver understands and will follow disposition?: Yes   Message from [facility] sent at 12/09/2024  2:10 PM EST  Reason for Triage: abdominal pain and diarrhea   Reason for Disposition  [1] MILD pain (e.g., does not interfere with normal activities) AND [2] pain comes and goes (cramps) AND [3] present > 48 hours  (Exception: This same abdominal pain is a chronic symptom recurrent or ongoing AND present > 4 weeks.)  Answer Assessment - Initial Assessment Questions 1. LOCATION: Where does it hurt?       Lower abdomen  2. RADIATION: Does the pain shoot anywhere else? (e.g., chest, back)     Radiates to back  3. ONSET: When did the pain begin? (e.g., minutes, hours or days ago)       X months  4. SUDDEN: Gradual or sudden onset?      sudden  5. PATTERN Does the pain come and go, or is it constant?     Comes and goes  6. SEVERITY: How bad is the pain?  (e.g., Scale 1-10; mild, moderate, or severe)      No  pain. Only ache  7. RECURRENT SYMPTOM: Have you ever had this type of stomach pain before? If Yes, ask: When was the last time? and What happened that time?       Yes, recurrent  8. CAUSE: What do you think is causing the stomach pain? (e.g., gallstones, recent abdominal surgery)      Unsure  9. RELIEVING/AGGRAVATING FACTORS: What makes it better or worse? (e.g., antacids, bending or twisting motion, bowel movement)      Unsure  10. OTHER SYMPTOMS: Pt denies diarrhea, fever, urination pain, vomiting           Pt reports abdominal pain Pt scheduled for a visit on   01.16.26  for further evaluation. Pt agrees with plan of care, will call back for any worsening symptoms  Protocols used: Abdominal Pain - [facility]

## 2024-12-09 NOTE — Progress Notes (Unsigned)
 "  ACUTE VISIT Chief Complaint  Patient presents with   Abdominal Pain   HPI: [name] is a 79 y.o. female, who is here today complaining of *** Discussed the use of AI scribe software for clinical note transcription with the patient, who gave verbal consent to proceed.  History of Present Illness [name] [name] is a 79 year old female who presents with abdominal discomfort and changes in bowel habits.  She has experienced abdominal discomfort for the past three days, described as a 'weird sensation' rather than pain, accompanied by an urge to have a bowel movement. Her bowel movements remain daily, but she notes a change in consistency. After breakfast, her stools become looser, resembling diarrhea, although not watery. Previously, she would have two bowel movements daily, with the second one now being looser than usual.  She noticed blood on the toilet paper on one occasion, described as bright red, with a significant amount initially and less on a subsequent occasion. There is no associated rectal pain or need to strain during bowel movements. No nausea, vomiting, fever, chills, or changes in urination.  Her last colonoscopy in 2021 was performed, and she recalls that the doctors mentioned seeing something that could have been a hemorrhoid or a prolapse. She is currently taking Coumadin .  She mentions a dietary change involving lasagna, which she suspects may have contributed to her symptoms, although her husband did not experience any issues.  She also reports a red mark on her skin, resembling a bruise, which she noticed while dressing. It is located near a previous appendectomy scar and is not painful or itchy.    Review of Systems See other pertinent positives and negatives in HPI.  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   Dr Waylon, Veverly Persons, Dr Skeet     Abnormal CT  scan of lung 02/06/2021   Acute bursitis of right shoulder 07/22/2022   Injection given July 22, 2022     Acute post-traumatic headache, not intractable 11/15/2019   Allergic rhinitis    Atypical chest pain    Bilateral carpal tunnel syndrome 07/17/2017   EMG, Modena Callander, M.D  06/2017     Bilateral primary osteoarthritis of knee 10/03/2013   Bilateral injections given May 09, 2020     Bilateral shoulder pain 12/11/2017   Binocular vision disorder with diplopia 07/29/2021   Carbuncle of groin 02/25/2019   Cardiomegaly 02/05/2021   Cervicalgia 10/28/2022   Cervicogenic headache 06/03/2018   Chest pain 02/05/2021   Chronic midline low back pain without sciatica 09/15/2016   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   Degenerative spondylolisthesis 05/06/2022   Diverticulosis    Dupuytren's contracture of hand 07/20/2017   Dysrhythmia    Epigastric pain 12/26/2010   Essential hypertension 09/27/2010   Gastritis 11/08/2007, 04/12/2012   H pylori bx neg 03/2012   Generalized osteoarthritis of multiple sites 06/30/2017   GERD (gastroesophageal reflux disease) 11/08/2007   Heart failure 02/21/2021   Hepatic cyst    Hiatal hernia    11/08/2007, 04/12/2012   Hyperlipidemia    IBS (irritable bowel syndrome)    diarrhea predom   Joint pain 06/03/2016   Lateral meniscal tear 10/03/2013   Degenerative seen on ultrasound October 03, 2013     Left lower quadrant abdominal pain 05/01/2020   Leg cramps 06/10/2017   Long term (current) use of anticoagulants 05/29/2021   Migraine headache 09/27/2010  Mild cognitive impairment 08/18/2023   Mitral regurgitation    Murmur 02/21/2021   Osteopenia 07/20/2016   dexa 01/2019: Spine -2.2, RFN -2.2, LFN -2.0, decrease since prior scan.. frax -12.5%, 3%-we will look into Prolia -if not covered then Reclast  DEXA from [place]-osteoporosis  Prior dexa scans have showed osteoporosis     Osteoporosis    DEXA 01/21/2012: -2.1 spine and R fem, -1.8  L fem s/p fosamax  2004-2011 DEXA 04/18/14 @ [facility]: -2.5 - rec to start Prolia      Paroxysmal atrial fibrillation 06/19/2021   Patellofemoral pain syndrome 10/03/2013   Personal history of urinary calculi 09/27/2010   Prediabetes 05/02/2019   PVC's (premature ventricular contractions)    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   Complex valvuloplasty including PTFE neochord placement x6 with 30 mm Medtronic Simuform ring annuloplasty with clipping of LA appendage   Schatzki's ring 11/11/2010   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   Shingles outbreak 04/2013   Skin tag 10/28/2022   Spinal stenosis, lumbar region, 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  Repeat injection given January 22, 2023     Trigger thumb of right hand 01/22/2023   Injection given January 22, 2023 repeat at the A1 pulley Apr 02, 2023     Varicose vein of leg    Allergies[2]  Social History   Socioeconomic History   Marital status: Married    Spouse name: Erla   Number of children: 3   Years of education: 18   Highest education level: Master's degree (e.g., MA, MS, MEng, MEd, MSW, MBA)  Occupational History   Occupation: Retired  Tobacco Use   Smoking status: Never   Smokeless tobacco: Never  Vaping Use   Vaping status: Never Used  Substance and Sexual Activity   Alcohol  use: No   Drug use: No   Sexual activity: Not on file  Other Topics Concern   Not on file  Social History Narrative   Married, lives with spouse. Retired comptroller, manufacturing systems engineer. Has 3 kids (moved to [place] to be closer)- youngest son MD. [place] to [place] from [place]  06/2010, lived in [place] x 10years   Social Drivers of Health   Tobacco Use: Low Risk (12/09/2024)   Patient History    Smoking Tobacco Use: Never    Smokeless  Tobacco Use: Never    Passive Exposure: Not on file  Financial Resource Strain: Low Risk (05/15/2023)   Overall Financial Resource Strain (CARDIA)    Difficulty of Paying Living Expenses: Not hard at all  Food Insecurity: No Food Insecurity (05/15/2023)   Hunger Vital Sign    Worried About Running Out of Food in the Last Year: Never true    Ran Out of Food in the Last Year: Never true  Transportation Needs: No Transportation Needs (05/15/2023)   PRAPARE - Administrator, Civil Service (Medical): No    Lack of Transportation (Non-Medical): No  Physical Activity: Insufficiently Active (05/15/2023)   Exercise Vital Sign    Days of Exercise per Week: 3 days    Minutes of Exercise per Session: 30 min  Stress: No Stress Concern Present (05/15/2023)   [employer] of Occupational Health - Occupational Stress Questionnaire    Feeling of Stress : Not at all  Social Connections: Socially Integrated (05/15/2023)  Social Connection and Isolation Panel    Frequency of Communication with Friends and Family: More than three times a week    Frequency of Social Gatherings with Friends and Family: More than three times a week    Attends Religious Services: More than 4 times per year    Active Member of Clubs or Organizations: Yes    Attends Banker Meetings: More than 4 times per year    Marital Status: Married  Depression (PHQ2-9): Low Risk (09/29/2024)   Depression (PHQ2-9)    PHQ-2 Score: 0  Alcohol  Screen: Low Risk (05/15/2023)   Alcohol  Screen    Last Alcohol  Screening Score (AUDIT): 0  Housing: Low Risk (05/15/2023)   Housing    Last Housing Risk Score: 0  Utilities: Not At Risk (05/15/2023)   AHC Utilities    Threatened with loss of utilities: No  Health Literacy: Not on file    Vitals:   12/09/24 1602  BP: 128/82  Pulse: 89  Resp: 16  Temp: 98 F (36.7 C)  SpO2: 98%   Body mass index is 25.21 kg/m.  Physical Exam Vitals and nursing note reviewed. Exam  conducted with a chaperone present.  Constitutional:      General: She is not in acute distress.    Appearance: She is well-developed.  HENT:     Head: Normocephalic and atraumatic.  Eyes:     Conjunctiva/sclera: Conjunctivae normal.  Cardiovascular:     Rate and Rhythm: Normal rate and regular rhythm.     Heart sounds: No murmur heard. Pulmonary:     Effort: Pulmonary effort is normal. No respiratory distress.     Breath sounds: Normal breath sounds.  Abdominal:     Palpations: Abdomen is soft. There is no mass.     Tenderness: There is no abdominal tenderness.  Genitourinary:    Rectum: No mass, tenderness, anal fissure or external hemorrhoid.     Comments: Small skin tag noted around 6 O'clock.  Skin:    General: Skin is warm.     Findings: No erythema or rash.         Comments: *** along her appendicectomy scar.  Neurological:     General: No focal deficit present.     Mental Status: She is alert and oriented to person, place, and time.     Gait: Gait normal.  Psychiatric:        Mood and Affect: Mood and affect normal.     ASSESSMENT AND PLAN:  There are no diagnoses linked to this encounter.  No orders of the defined types were placed in this encounter.   No problem-specific Assessment & Plan notes found for this encounter.    No follow-ups on file.  Melessia Kaus G. Mary Hockey, MD  [facility]. [place] office.     [1]  Current Outpatient Medications on File Prior to Visit  Medication Sig Dispense Refill   acetaminophen  (TYLENOL ) 650 MG CR tablet Take 650 mg by mouth every 8 (eight) hours as needed for pain (Headache).     amLODipine  (NORVASC ) 5 MG tablet Take 1 tablet (5 mg total) by mouth daily. 90 tablet 3   aspirin  EC 81 MG EC tablet Take 1 tablet (81 mg total) by mouth daily. Swallow whole. 30 tablet 11   atorvastatin  (LIPITOR) 20 MG tablet Take 1 tablet (20 mg total) by mouth daily. 90 tablet 1   gabapentin  (NEURONTIN ) 100 MG capsule Take 1  capsule (100 mg total) by mouth in  the morning AND 2 capsules (200 mg total) every evening. 90 capsule 5   metoprolol  succinate (TOPROL -XL) 25 MG 24 hr tablet Take 1 tablet (25 mg total) by mouth 2 (two) times daily. 180 tablet 3   oxyCODONE  (ROXICODONE ) 5 MG immediate release tablet Take 1 tablet (5 mg total) by mouth every 4 (four) hours as needed. 30 tablet 0   Polyethyl Glycol-Propyl Glycol (SYSTANE) 0.4-0.3 % SOLN Place 1 drop into both eyes 2 (two) times daily.     warfarin (COUMADIN ) 2 MG tablet Take 1-1&1/2 tablets (2-3 mg total) by mouth daily or as directed by Anticoagulation Clinic. 120 tablet 1   metoprolol  succinate (TOPROL -XL) 25 MG 24 hr tablet TAKE ONE TABLET BY MOUTH EVERY MORNING & TAKE ONE TABLET BY MOUTH EVERY EVENING (Patient not taking: Reported on 12/09/2024) 180 tablet 2   tiZANidine  (ZANAFLEX ) 4 MG tablet Take 0.5 tablets (2 mg total) by mouth every 6 (six) hours as needed. (Patient not taking: Reported on 12/09/2024) 30 tablet 0   No current facility-administered medications on file prior to visit.  [2] No Known Allergies  "

## 2024-12-09 NOTE — Patient Instructions (Addendum)
 A few things to remember from today's visit:  Change in bowel habits  Rectal discomfort  Localized osteoporosis without current pathological fracture - Plan: DG Bone Density  Rectal bleed Aplique Desitin en la cicatriz. Mantenga la cita con art therapist.  If you need refills for medications you take chronically, please call your pharmacy. Do not use My Chart to request refills or for acute issues that need immediate attention. If you send a my chart message, it may take a few days to be addressed, specially if I am not in the office.  Please be sure medication list is accurate. If a new problem present, please set up appointment sooner than planned today.

## 2024-12-10 ENCOUNTER — Encounter: Payer: Self-pay | Admitting: Family Medicine

## 2024-12-12 ENCOUNTER — Other Ambulatory Visit (HOSPITAL_COMMUNITY): Payer: Self-pay

## 2024-12-12 ENCOUNTER — Other Ambulatory Visit: Payer: Self-pay | Admitting: Cardiovascular Disease

## 2024-12-12 ENCOUNTER — Other Ambulatory Visit: Payer: Self-pay

## 2024-12-12 MED ORDER — METOPROLOL SUCCINATE ER 25 MG PO TB24
25.0000 mg | ORAL_TABLET | Freq: Two times a day (BID) | ORAL | 3 refills | Status: AC
  Filled 2024-12-12: qty 180, 90d supply, fill #0

## 2024-12-16 ENCOUNTER — Telehealth (HOSPITAL_BASED_OUTPATIENT_CLINIC_OR_DEPARTMENT_OTHER): Payer: Self-pay

## 2024-12-16 NOTE — Telephone Encounter (Signed)
 Please advise holding Warfarin prior to deep cleaning. Last labs 11/2024.   Thank you!  AW

## 2024-12-16 NOTE — Telephone Encounter (Signed)
" ° °  Name: [name] [name]  DOB: 1946-08-26  MRN: [id number]  Primary Cardiologist: Dorn Lesches, MD  Chart reviewed as part of pre-operative protocol coverage. Because of [name] [name]'s past medical history and time since last visit, she will require a follow-up in-office visit in order to better assess preoperative cardiovascular risk.  Pre-op covering staff: - Please schedule appointment and call patient to inform them. If patient already had an upcoming appointment within acceptable timeframe, please add pre-op clearance to the appointment notes so provider is aware. - Please contact requesting surgeon's office via preferred method (i.e, phone, fax) to inform them of need for appointment prior to surgery.  This message will also be routed to pharmacy pool for input on holding Warfarin as requested below so that this information is available to the clearing provider at time of patient's appointment.   [name], FNP  12/16/2024, 9:54 AM   "

## 2024-12-16 NOTE — Telephone Encounter (Signed)
"  ° °  Pre-operative Risk Assessment    Patient Name: Arisbeth Purrington  DOB: 1946-09-22 MRN: [id number]   Date of last office visit: 11/16/23 with Dr. Court Date of next office visit: NA  Request for Surgical Clearance    Procedure:  Deep cleaning   Date of Surgery:  Clearance TBD                                 Surgeon:  Not indicated Surgeon's Group or Practice Name:  [facility] Phone number:  [phone] Fax number:  [phone]   Type of Clearance Requested:   - Medical  - Pharmacy:  Hold Aspirin  and Warfarin (Coumadin ) not indicated    Type of Anesthesia:  Local    Additional requests/questions:    Bonney Augustin JONETTA Delores   12/16/2024, 9:43 AM   "

## 2024-12-16 NOTE — Telephone Encounter (Signed)
 1st attempt to reach pt regarding surgical clearance and the need for an IN OFFICE appointment.  As I were on the line with the patient the call dropped. Attempted to call back and received no answer. [id number]

## 2024-12-19 ENCOUNTER — Other Ambulatory Visit: Payer: Self-pay

## 2024-12-19 ENCOUNTER — Other Ambulatory Visit (HOSPITAL_COMMUNITY): Payer: Self-pay

## 2024-12-19 MED ORDER — AMOXICILLIN 500 MG PO CAPS
ORAL_CAPSULE | ORAL | 0 refills | Status: AC
  Filled 2024-12-19: qty 4, 1d supply, fill #0

## 2024-12-19 NOTE — Telephone Encounter (Addendum)
 Per protocol warfarin hold not indicated due minimal bleeding risk associated with minor dental procedure like deep cleaning or one tooth extraction. However due to S/ p MV repair ( 30 mm Medtronic Sinuform ring annuloplasty; procedure date 6/ 28/ 22) pt need abx ppx before procedure. Amoxil  prescription sent to the pharmacy

## 2024-12-19 NOTE — Addendum Note (Signed)
 Addended by: Jayna Mulnix K on: 12/19/2024 03:28 PM   Modules accepted: Orders

## 2024-12-20 NOTE — Telephone Encounter (Signed)
2nd attempt to reach pt to schedule in office appt for pre op clearance.

## 2024-12-21 NOTE — Telephone Encounter (Signed)
 Patient is to be scheduled for an office visit for surgical clearance.  This note will be removed from preop pool. Glendia Ferrier, PA-C 12/21/2024 8:22 AM

## 2024-12-21 NOTE — Telephone Encounter (Signed)
 I s/w the pt and she tells me that she is not going to  have the dental cleaning at this time as she will be traveling. She also tells me that it is just a regular dental cleaning and not a deep dental cleaning.   Pt states she will keep her appt with the anticoag dept.   I asked the pt that when she is ready to have the dental cleaning to have the DDS re-fax a preop request. Pt verbalized understanding to plan of care.

## 2024-12-23 ENCOUNTER — Other Ambulatory Visit: Payer: Self-pay | Admitting: Family Medicine

## 2024-12-23 ENCOUNTER — Other Ambulatory Visit

## 2024-12-23 ENCOUNTER — Encounter: Payer: Self-pay | Admitting: Physician Assistant

## 2024-12-23 ENCOUNTER — Other Ambulatory Visit (HOSPITAL_COMMUNITY): Payer: Self-pay

## 2024-12-23 ENCOUNTER — Ambulatory Visit: Payer: Self-pay | Admitting: Physician Assistant

## 2024-12-23 ENCOUNTER — Ambulatory Visit: Admitting: Physician Assistant

## 2024-12-23 ENCOUNTER — Other Ambulatory Visit: Payer: Self-pay

## 2024-12-23 VITALS — BP 120/70 | HR 85 | Ht 61.0 in | Wt 132.0 lb

## 2024-12-23 DIAGNOSIS — K625 Hemorrhage of anus and rectum: Secondary | ICD-10-CM

## 2024-12-23 DIAGNOSIS — K648 Other hemorrhoids: Secondary | ICD-10-CM

## 2024-12-23 DIAGNOSIS — K6289 Other specified diseases of anus and rectum: Secondary | ICD-10-CM

## 2024-12-23 DIAGNOSIS — E782 Mixed hyperlipidemia: Secondary | ICD-10-CM

## 2024-12-23 DIAGNOSIS — K623 Rectal prolapse: Secondary | ICD-10-CM | POA: Diagnosis not present

## 2024-12-23 DIAGNOSIS — K589 Irritable bowel syndrome without diarrhea: Secondary | ICD-10-CM

## 2024-12-23 LAB — CBC WITH DIFFERENTIAL/PLATELET
Basophils Absolute: 0.1 10*3/uL (ref 0.0–0.1)
Basophils Relative: 1 % (ref 0.0–3.0)
Eosinophils Absolute: 0.1 10*3/uL (ref 0.0–0.7)
Eosinophils Relative: 2.2 % (ref 0.0–5.0)
HCT: 41.3 % (ref 36.0–46.0)
Hemoglobin: 14.1 g/dL (ref 12.0–15.0)
Lymphocytes Relative: 27.2 % (ref 12.0–46.0)
Lymphs Abs: 1.7 10*3/uL (ref 0.7–4.0)
MCHC: 34 g/dL (ref 30.0–36.0)
MCV: 98.2 fl (ref 78.0–100.0)
Monocytes Absolute: 0.6 10*3/uL (ref 0.1–1.0)
Monocytes Relative: 10 % (ref 3.0–12.0)
Neutro Abs: 3.8 10*3/uL (ref 1.4–7.7)
Neutrophils Relative %: 59.6 % (ref 43.0–77.0)
Platelets: 235 10*3/uL (ref 150.0–400.0)
RBC: 4.21 Mil/uL (ref 3.87–5.11)
RDW: 13.6 % (ref 11.5–15.5)
WBC: 6.3 10*3/uL (ref 4.0–10.5)

## 2024-12-23 MED ORDER — ATORVASTATIN CALCIUM 20 MG PO TABS
20.0000 mg | ORAL_TABLET | Freq: Every day | ORAL | 1 refills | Status: AC
  Filled 2024-12-23: qty 90, 90d supply, fill #0

## 2024-12-23 MED ORDER — HYDROCORTISONE ACETATE 25 MG RE SUPP
25.0000 mg | Freq: Every day | RECTAL | 1 refills | Status: AC
  Filled 2024-12-23 – 2024-12-26 (×2): qty 12, 12d supply, fill #0

## 2024-12-23 NOTE — Patient Instructions (Addendum)
 For Internal Hemorrhoids: - Rx Hydrocortisone  Suppositories 25mg  Insert 1 into rectum once daily at bedtime for 12 days. - Stressed importance of treating underlying constipation. - Avoid Sitting on the toilet for prolonged amount of time. - Discussed Internal Hemorrhoid Banding if no improvement with conservative treament.   Your provider has requested that you go to the basement level for lab work before leaving today. Press B on the elevator. The lab is located at the first door on the left as you exit the elevator.  _______________________________________________________  If your blood pressure at your visit was 140/90 or greater, please contact your primary care physician to follow up on this.  _______________________________________________________  If you are age 82 or older, your body mass index should be between 23-30. Your Body mass index is 24.94 kg/m. If this is out of the aforementioned range listed, please consider follow up with your Primary Care Provider.  If you are age 39 or younger, your body mass index should be between 19-25. Your Body mass index is 24.94 kg/m. If this is out of the aformentioned range listed, please consider follow up with your Primary Care Provider.   ________________________________________________________  The [facility] providers would like to encourage you to use MYCHART to communicate with providers for non-urgent requests or questions.  Due to long hold times on the telephone, sending your provider a message by [facility] may be a faster and more efficient way to get a response.  Please allow 48 business hours for a response.  Please remember that this is for non-urgent requests.  _______________________________________________________  [facility] is using a team-based approach to care.  Your team is made up of your doctor and two to three APPS. Our APPS (Nurse Practitioners and Physician Assistants) work with your physician to  ensure care continuity for you. They are fully qualified to address your health concerns and develop a treatment plan. They communicate directly with your gastroenterologist to care for you. Seeing the Advanced Practice Practitioners on your physician's team can help you by facilitating care more promptly, often allowing for earlier appointments, access to diagnostic testing, procedures, and other specialty referrals.

## 2024-12-23 NOTE — Progress Notes (Signed)
 "     [name] Console, PA-C [address] [place], [place]  [address] Phone: [phone]   Primary Care Physician: Jordan, Betty G, MD  Primary Gastroenterologist:  [name] Console, PA-C / Elspeth Naval, MD   Chief Complaint: Rectal pain, rectal bleeding, irregular bowel habits   HPI:   Discussed the use of AI scribe software for clinical note transcription with the patient, who gave verbal consent to proceed.  Established patient of Dr. Naval.  Patient last saw Dr. Naval 02/2023 for follow-up of IBS, GERD.  History of A-fib on Coumadin .  Recently saw PCP Dr. Betty Jordan 12/09/2024 to evaluate abdominal discomfort, irregular bowel habits, and rectal discomfort.  Noticed bright red blood on the tissue on 2 occasions.  No straining or constipation.  Has had some loose stools.  11/28/2024 lab: Normal INR 2.3. 09/28/2024 lab: Normal CBC, Hgb 14.2.  07/2020 colonoscopy (to evaluate rectal bleeding): Internal hemorrhoids.  Local area of mucosa in the distal rectum/dentate line consistent with focal prolapse change.  No polyps.  Biopsies were negative for microscopic colitis.  No dysplasia.  07/2020 EGD by Dr. Naval: 2 cm hiatal hernia, otherwise normal esophagus.  Multiple benign fundic gland gastric polyps.  Diffuse mild gastritis.  Normal duodenum.  Biopsies negative for celiac and H. pylori.  Biopsy showed mild chronic gastritis.  02/2023 barium swallow with tablet: Small sliding hiatal hernia.  Mild reflux.  Normal esophageal motility.  History of Present Illness [name] [name] is a 79 year old female with internal hemorrhoids, rectal prolapse, irritable bowel syndrome, and chronic anticoagulation who presents for evaluation of rectal bleeding.  Rectal Bleeding and Prolapse: - Rectal bleeding ongoing, primarily noted on toilet paper after bowel movements - She has not seen blood in the toilet. - Occasionally sees bright red blood on a pad into her undergarment. -  Associated with rectal pain and sensation of tissue protrusion at the anus, described as something is coming down in the back - Bleeding and prolapse symptoms contribute to significant discomfort and distress - Internal hemorrhoids and mild rectal prolapse identified on colonoscopy in September 2021  Bowel Habits and Irritable Bowel Syndrome: - Bowel movements currently regular, typically two to three times per day in the morning - No constipation - Occasional use of Metamucil as needed - Currently taking OTC peppermint oil (Equate brand, previously IBGard) taken as needed for irritable bowel syndrome symptoms, most recently yesterday, with effective symptom control  Hemorrhoid Management: - Currently using OTC hydrocortisone  cream which is not helping - Has not tried suppositories  Anticoagulation Therapy and Hematologic Status: - Chronic Coumadin  therapy maintained following heart valve surgery - Coumadin  level last checked November 28, 2024, therapeutic - Hemoglobin last checked November 2025, measured at 14 - Prior hemoglobin low of 8.5 three years ago, stable above 12 since then  Travel Plans: - Planning to travel overseas to [place] (leaving 12/26/2024) for one month and seeks symptom relief prior to trip.     GI DIAGNOSTIC HISTORY:  CT scan 02/02/21 - Abdomen / pelvis with contrast - IMPRESSION: 1. No acute CT findings of the abdomen or pelvis to explain abdominal pain. 2. Moderate right, small left pleural effusions and associated atelectasis or consolidation, separately reported on examination of the chest. 3. Aortic atherosclerosis.   Echocardiogram 07/03/21 - EF 40-45%   GI pathogen panel - 08/06/21 - Enteroaggregative/pathogenic E coli   EGD 04/12/2012 - 3cm HH, otherwise normal, clo test negative  Colonoscopy 04/12/2012 - diverticulosis, no polyps  EGD 10/2008 - mild Shatski ring dilated to 54Fr Agapito, small HH    Current Outpatient Medications  Medication Sig  Dispense Refill   acetaminophen  (TYLENOL ) 650 MG CR tablet Take 650 mg by mouth every 8 (eight) hours as needed for pain (Headache).     amLODipine  (NORVASC ) 5 MG tablet Take 1 tablet (5 mg total) by mouth daily. 90 tablet 3   aspirin  EC 81 MG EC tablet Take 1 tablet (81 mg total) by mouth daily. Swallow whole. 30 tablet 11   gabapentin  (NEURONTIN ) 100 MG capsule Take 1 capsule (100 mg total) by mouth in the morning AND 2 capsules (200 mg total) every evening. 90 capsule 5   hydrocortisone  (ANUSOL -HC) 25 MG suppository Place 1 suppository (25 mg total) rectally at bedtime for 24 days. 12 suppository 1   metoprolol  succinate (TOPROL -XL) 25 MG 24 hr tablet Take 1 tablet (25 mg total) by mouth 2 (two) times daily. 180 tablet 3   Polyethyl Glycol-Propyl Glycol (SYSTANE) 0.4-0.3 % SOLN Place 1 drop into both eyes 2 (two) times daily.     warfarin (COUMADIN ) 2 MG tablet Take 1-1&1/2 tablets (2-3 mg total) by mouth daily or as directed by Anticoagulation Clinic. 120 tablet 1   amoxicillin  (AMOXIL ) 500 MG capsule Take 4 capsules 1 hour before dental procedure (Patient not taking: Reported on 12/23/2024) 4 capsule 0   atorvastatin  (LIPITOR) 20 MG tablet Take 1 tablet (20 mg total) by mouth daily. 90 tablet 1   metoprolol  succinate (TOPROL -XL) 25 MG 24 hr tablet TAKE ONE TABLET BY MOUTH EVERY MORNING & TAKE ONE TABLET BY MOUTH EVERY EVENING (Patient not taking: Reported on 12/23/2024) 180 tablet 2   oxyCODONE  (ROXICODONE ) 5 MG immediate release tablet Take 1 tablet (5 mg total) by mouth every 4 (four) hours as needed. (Patient not taking: Reported on 12/23/2024) 30 tablet 0   tiZANidine  (ZANAFLEX ) 4 MG tablet Take 0.5 tablets (2 mg total) by mouth every 6 (six) hours as needed. (Patient not taking: Reported on 12/23/2024) 30 tablet 0   No current facility-administered medications for this visit.    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   Dr Waylon, Veverly Persons, Dr Skeet     Abnormal CT scan of lung 02/06/2021   Acute bursitis of right shoulder 07/22/2022   Injection given July 22, 2022     Acute post-traumatic headache, not intractable 11/15/2019   Allergic rhinitis    Atypical chest pain    Bilateral carpal tunnel syndrome 07/17/2017   EMG, Modena Callander, M.D  06/2017     Bilateral primary osteoarthritis of knee 10/03/2013   Bilateral injections given May 09, 2020     Bilateral shoulder pain 12/11/2017   Binocular vision disorder with diplopia 07/29/2021   Carbuncle of groin 02/25/2019   Cardiomegaly 02/05/2021   Cervicalgia 10/28/2022   Cervicogenic headache 06/03/2018   Chest pain 02/05/2021   Chronic midline low back pain without sciatica 09/15/2016   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   Degenerative spondylolisthesis 05/06/2022   Diverticulosis    Dupuytren's contracture of hand 07/20/2017   Dysrhythmia    Epigastric pain 12/26/2010   Essential hypertension 09/27/2010   Gastritis 11/08/2007, 04/12/2012   H pylori bx neg 03/2012   Generalized osteoarthritis of multiple sites 06/30/2017   GERD (gastroesophageal reflux disease) 11/08/2007   Heart failure 02/21/2021   Hepatic cyst  Hiatal hernia    11/08/2007, 04/12/2012   Hyperlipidemia    IBS (irritable bowel syndrome)    diarrhea predom   Joint pain 06/03/2016   Lateral meniscal tear 10/03/2013   Degenerative seen on ultrasound October 03, 2013     Left lower quadrant abdominal pain 05/01/2020   Leg cramps 06/10/2017   Long term (current) use of anticoagulants 05/29/2021   Migraine headache 09/27/2010   Mild cognitive impairment 08/18/2023   Mitral regurgitation    Murmur 02/21/2021   Osteopenia 07/20/2016   dexa 01/2019: Spine -2.2, RFN -2.2, LFN -2.0, decrease since prior scan.. 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  Repeat injection given January 22, 2023     Trigger thumb of right hand 01/22/2023   Injection given January 22, 2023 repeat at the A1 pulley Apr 02, 2023     Varicose vein of leg     Past Surgical History:  Procedure Laterality Date   ANTERIOR CERVICAL DECOMP/DISCECTOMY FUSION N/A 05/06/2022   Procedure: CERVICAL THREE-FOUR, CERVICAL FOUR-FIVE ANTERIOR CERVICAL DECOMPRESSION/DISCECTOMY FUSION;  Surgeon: Louis Shove, MD;  Location: [facility] OR;  Service: Neurosurgery;  Laterality: N/A;   APPENDECTOMY  1958   BUBBLE STUDY  03/14/2021   Procedure: BUBBLE STUDY;  Surgeon: Shlomo Wilbert SAUNDERS, MD;  Location: [facility] ENDOSCOPY;  Service: Cardiovascular;;   CARDIAC CATHETERIZATION     CATARACT EXTRACTION, BILATERAL Bilateral    CLIPPING OF ATRIAL APPENDAGE N/A 05/21/2021   Procedure: CLIPPING OF ATRIAL APPENDAGE USING ATRICURE PRO2 CLIP;  Surgeon: Dusty Sudie DEL, MD;  Location: [facility] OR;  Service: Open Heart Surgery;  Laterality: N/A;   COLONOSCOPY     CYSTOCELE REPAIR  2008    ESOPHAGOGASTRODUODENOSCOPY     Maxilofacial  1992   MITRAL VALVE REPAIR  05/21/2021   Procedure: MINIMALLY INVASIVE MITRAL VALVE REPAIR (MVR) USING MEDTRONIC SIMUFORM RING;  Surgeon: Dusty Sudie DEL, MD;  Location: [facility] OR;  Service: Open Heart Surgery;;   REVERSE SHOULDER ARTHROPLASTY Right 10/13/2024   Procedure: ARTHROPLASTY, SHOULDER, TOTAL, REVERSE;  Surgeon: Dozier Soulier, MD;  Location: [facility] ORS;  Service: Orthopedics;  Laterality: Right;   RIGHT/LEFT HEART CATH AND CORONARY ANGIOGRAPHY N/A 03/14/2021   Procedure: RIGHT/LEFT HEART CATH AND CORONARY ANGIOGRAPHY;  Surgeon: Court Dorn PARAS, MD;  Location: [facility] INVASIVE CV LAB;  Service: Cardiovascular;  Laterality: N/A;   TEE WITHOUT CARDIOVERSION N/A 03/14/2021   Procedure: TRANSESOPHAGEAL ECHOCARDIOGRAM (TEE);  Surgeon: Shlomo Wilbert SAUNDERS, MD;  Location: [facility] ENDOSCOPY;  Service: Cardiovascular;  Laterality: N/A;   TEE WITHOUT CARDIOVERSION N/A 05/21/2021   Procedure: TRANSESOPHAGEAL ECHOCARDIOGRAM (TEE);  Surgeon: Dusty Sudie DEL, MD;  Location: [facility] OR;  Service: Open Heart Surgery;  Laterality: N/A;   TONSILLECTOMY  1960    Review of Systems:    All systems reviewed and negative except where noted in HPI.    Physical Exam:  BP 120/70   Pulse 85   Ht 5' 1 (1.549 m)   Wt 132 lb (59.9 kg)   BMI 24.94 kg/m  No LMP recorded. Patient is postmenopausal.  General: Well-nourished, well-developed in no acute distress.  Lungs: Clear to auscultation bilaterally. Non-labored. Heart: Regular rate and rhythm, no murmurs rubs or gallops.  Abdomen: Bowel sounds are normal; Abdomen is Soft; No hepatosplenomegaly, masses or hernias;  No Abdominal Tenderness; No guarding or rebound tenderness. Neuro: Alert and oriented x 3.  Grossly intact.  Walks with normal gait.  No assistive devices.  Able to get on and off exam table with no help. Rectal: 1 small external hemorrhoid skin tag.  There are no swollen external hemorrhoids.  Weak anal  sphincter tone.  No rectal masses or tenderness.  No significant rectal prolapse at rest. Anoscopy: Moderate large swollen internal hemorrhoids noted.  No rectal masses.  No pain during insertion of anoscope.  No gross blood.  Chaperone for Exam:  Alan Fusi, CMA    Imaging Studies: No results found.  Labs: CBC    Component Value Date/Time   WBC 6.3 12/23/2024 1532   RBC 4.21 12/23/2024 1532   HGB 14.1 12/23/2024 1532   HGB 13.6 03/25/2022 1020   HCT 41.3 12/23/2024 1532   HCT 40.5 03/25/2022 1020   PLT 235.0 12/23/2024 1532   PLT 232 03/25/2022 1020   MCV 98.2 12/23/2024 1532   MCV 99 (H) 03/25/2022 1020   MCH 34.1 (H) 04/28/2022 1027   MCHC 34.0 12/23/2024 1532   RDW 13.6 12/23/2024 1532   RDW 12.6 03/25/2022 1020   LYMPHSABS 1.7 12/23/2024 1532   LYMPHSABS 1.9 03/25/2022 1020   MONOABS 0.6 12/23/2024 1532   EOSABS 0.1 12/23/2024 1532   EOSABS 0.2 03/25/2022 1020   BASOSABS 0.1 12/23/2024 1532   BASOSABS 0.1 03/25/2022 1020    CMP     Component Value Date/Time   NA 140 09/28/2024 1617   NA 142 12/02/2023 0000   K 4.7 09/28/2024 1617   CL 105 09/28/2024 1617   CO2 27 09/28/2024 1617   GLUCOSE 89 09/28/2024 1617   BUN 25 (H) 09/28/2024 1617   BUN 18 12/02/2023 0000   CREATININE 0.94 09/28/2024 1617   CALCIUM  9.3 09/28/2024 1617   PROT 6.2 12/02/2023 0000   ALBUMIN  4.2 12/02/2023 0000   AST 24 12/02/2023 0000   ALT 17 12/02/2023 0000   ALKPHOS 50 12/02/2023 0000   BILITOT 0.5 12/02/2023 0000   GFRNONAA >60 04/28/2022 1027    Assessment and Plan:   Nyasia Baxley is a 79 y.o. y/o female presents for: Assessment & Plan 1.  Internal hemorrhoids with rectal bleeding Chronic internal hemorrhoids with mild bleeding and discomfort, increased bleeding risk due to anticoagulation, no acute anemia. - Prescribed hydrocortisone  25mg  suppositories, 1 per rectum at bedtime for 12 days, 1 RF. - Ordered CBC / hemoglobin to evaluate for anemia. - Discussed  potential future banding if suppositories ineffective. - Provided suppository use instructions, including during travel. - Follow-up with PCP to continue to monitor Coumadin  levels (INR).  2.  Mild  rectal prolapse A little bit of rectal prolapse noted. - Will reassess after suppository trial and travel.  3.  Irritable bowel syndrome with irregular bowel habits Chronic IBS, well-controlled with as-needed peppermint oil and occasional fiber. - Advised peppermint oil up to twice daily as needed. - Continue Metamucil as needed.   [name] Console, PA-C  Follow up in 6 weeks with TG for follow-up of hemorrhoids and rectal bleeding.  If symptoms persist, then I will schedule internal hemorrhoid banding with Dr. Leigh.   "

## 2024-12-24 NOTE — Progress Notes (Signed)
 Agree with assessment and plan as outlined. Ellouise thank you for working in this patient while I am out of the office this week.

## 2024-12-26 ENCOUNTER — Other Ambulatory Visit (HOSPITAL_COMMUNITY): Payer: Self-pay

## 2024-12-26 ENCOUNTER — Other Ambulatory Visit: Payer: Self-pay

## 2024-12-27 ENCOUNTER — Ambulatory Visit

## 2024-12-27 ENCOUNTER — Other Ambulatory Visit (HOSPITAL_COMMUNITY): Payer: Self-pay

## 2024-12-27 DIAGNOSIS — I340 Nonrheumatic mitral (valve) insufficiency: Secondary | ICD-10-CM | POA: Diagnosis not present

## 2024-12-27 DIAGNOSIS — Z7901 Long term (current) use of anticoagulants: Secondary | ICD-10-CM

## 2024-12-27 DIAGNOSIS — Z9889 Other specified postprocedural states: Secondary | ICD-10-CM

## 2024-12-27 LAB — POCT INR: INR: 3 (ref 2.0–3.0)

## 2024-12-28 ENCOUNTER — Telehealth (HOSPITAL_BASED_OUTPATIENT_CLINIC_OR_DEPARTMENT_OTHER): Payer: Self-pay | Admitting: *Deleted

## 2024-12-28 ENCOUNTER — Other Ambulatory Visit (HOSPITAL_COMMUNITY): Payer: Self-pay

## 2024-12-28 NOTE — Telephone Encounter (Signed)
"  ° °  Pre-operative Risk Assessment    [name]  DOB: 10-Oct-1946 MRN: [id number]   Date of last office visit: 08/05/2024 Date of next office visit: None  Request for Surgical Clearance    Procedure:  Lumbar TF - ESI  Date of Surgery:  Clearance TBD                                 Surgeon:  Dr. Scot Flatten Surgeon's Group or Practice Name:  [facility] Phone number:  [phone] Fax number:  [phone]   Type of Clearance Requested:   - Medical  - Pharmacy:  Hold Aspirin  and Warfarin (Coumadin ) Not indicated   Type of Anesthesia:  Not Indicated   Additional requests/questions:    Signed, Edsel Grayce Sanders   12/28/2024, 10:21 AM   "

## 2024-12-30 NOTE — Telephone Encounter (Signed)
 I s/w the pt and she tells me that they are going to be out of town for 1 month, they are leaving this Monday 01/02/25. Pt said she would like to leave it that she will call back and schedule in office appt once they are back home.   In will update the requesting office. In the mean time I will remove from the preop call back pool. When the the pt calls back she can let the scheduler know she needs in office appt for preop clearance.

## 2024-12-30 NOTE — Telephone Encounter (Signed)
 Patient with diagnosis of atrial fibrillation on warfarin for anticoagulation.    Procedure:  Lumbar TF - ESI   Date of Surgery:  Clearance TBD    CHA2DS2-VASc Score = 7   This indicates a 11.2% annual risk of stroke. The patient's score is based upon: CHF History: 1 HTN History: 1 Diabetes History: 0 Stroke History: 2 Vascular Disease History: 0 Age Score: 2 Gender Score: 1    CrCl 46 Platelet count 235  Patient has not had an Afib/aflutter ablation in the last 3 months, DCCV within the last 4 weeks or a watchman implanted in the last 45 days   Per office protocol, patient can holdwarfarin for 5 days prior to procedure.   Patient WILL need bridging with Lovenox  (enoxaparin ) around procedure.  Patient next INR appt 02/07/25.  If procedure scheduled anytime before 3/24, patient will need to contact coumadin  clinic for earlier appointment  **This guidance is not considered finalized until pre-operative APP has relayed final recommendations.**

## 2024-12-30 NOTE — Telephone Encounter (Signed)
" ° °  Name: [name] [name]  DOB: 1946-05-17  MRN: [id number]  Primary Cardiologist: Dorn Lesches, MD  Chart reviewed as part of pre-operative protocol coverage. Because of [name]'s past medical history and time since last visit, she will require a follow-up in-office visit in order to better assess preoperative cardiovascular risk. Last seen by Dr. Lesches on 11/16/2023. All other visits have been telephone visits.  Pre-op covering staff: - Please schedule appointment and call patient to inform them. If patient already had an upcoming appointment within acceptable timeframe, please add pre-op clearance to the appointment notes so provider is aware. - Please contact requesting surgeon's office via preferred method (i.e, phone, fax) to inform them of need for appointment prior to surgery.  Per office protocol, patient can holdwarfarin for 5 days prior to procedure.   Patient WILL need bridging with Lovenox  (enoxaparin ) around procedure.   Patient next INR appt 02/07/25.  If procedure scheduled anytime before 3/24, patient will need to contact coumadin  clinic for earlier appointment  Lamarr Satterfield, NP  12/30/2024, 4:20 PM   "

## 2025-01-05 ENCOUNTER — Ambulatory Visit: Admitting: Physician Assistant

## 2025-01-06 ENCOUNTER — Ambulatory Visit

## 2025-01-09 ENCOUNTER — Ambulatory Visit

## 2025-01-17 ENCOUNTER — Ambulatory Visit: Admitting: Gastroenterology

## 2025-01-31 ENCOUNTER — Ambulatory Visit: Admitting: Physician Assistant

## 2025-02-06 ENCOUNTER — Ambulatory Visit: Admitting: Physician Assistant

## 2025-02-07 ENCOUNTER — Ambulatory Visit
